# Patient Record
Sex: Female | Born: 1945 | Race: Black or African American | Hispanic: No | Marital: Married | Smoking: Never smoker
Health system: Southern US, Community
[De-identification: ages and names within clinical notes are randomized; demographics above are authoritative.]

## PROBLEM LIST (undated history)

## (undated) DIAGNOSIS — E785 Hyperlipidemia, unspecified: Secondary | ICD-10-CM

## (undated) DIAGNOSIS — I10 Essential (primary) hypertension: Secondary | ICD-10-CM

## (undated) DIAGNOSIS — H44009 Unspecified purulent endophthalmitis, unspecified eye: Secondary | ICD-10-CM

## (undated) DIAGNOSIS — M5410 Radiculopathy, site unspecified: Secondary | ICD-10-CM

## (undated) DIAGNOSIS — B009 Herpesviral infection, unspecified: Secondary | ICD-10-CM

## (undated) DIAGNOSIS — A609 Anogenital herpesviral infection, unspecified: Secondary | ICD-10-CM

## (undated) DIAGNOSIS — M797 Fibromyalgia: Secondary | ICD-10-CM

## (undated) DIAGNOSIS — R51 Headache: Secondary | ICD-10-CM

## (undated) DIAGNOSIS — K219 Gastro-esophageal reflux disease without esophagitis: Secondary | ICD-10-CM

## (undated) DIAGNOSIS — N3010 Interstitial cystitis (chronic) without hematuria: Secondary | ICD-10-CM

## (undated) DIAGNOSIS — D329 Benign neoplasm of meninges, unspecified: Secondary | ICD-10-CM

## (undated) DIAGNOSIS — B9620 Unspecified Escherichia coli [E. coli] as the cause of diseases classified elsewhere: Secondary | ICD-10-CM

## (undated) DIAGNOSIS — R739 Hyperglycemia, unspecified: Secondary | ICD-10-CM

## (undated) DIAGNOSIS — E119 Type 2 diabetes mellitus without complications: Secondary | ICD-10-CM

## (undated) DIAGNOSIS — K635 Polyp of colon: Secondary | ICD-10-CM

## (undated) DIAGNOSIS — N200 Calculus of kidney: Secondary | ICD-10-CM

## (undated) DIAGNOSIS — M1990 Unspecified osteoarthritis, unspecified site: Secondary | ICD-10-CM

## (undated) DIAGNOSIS — N390 Urinary tract infection, site not specified: Secondary | ICD-10-CM

## (undated) DIAGNOSIS — R946 Abnormal results of thyroid function studies: Secondary | ICD-10-CM

## (undated) DIAGNOSIS — I2510 Atherosclerotic heart disease of native coronary artery without angina pectoris: Secondary | ICD-10-CM

## (undated) HISTORY — DX: Benign neoplasm of meninges, unspecified: D32.9

## (undated) HISTORY — DX: Calculus of kidney: N20.0

## (undated) HISTORY — DX: Headache: R51

## (undated) HISTORY — PX: TONSILLECTOMY: SUR1361

## (undated) HISTORY — PX: TRIGGER FINGER RELEASE: SHX641

## (undated) HISTORY — PX: FLEXIBLE BRONCHOSCOPY W/ UPPER ENDOSCOPY: SHX1648

## (undated) HISTORY — DX: Abnormal results of thyroid function studies: R94.6

## (undated) HISTORY — PX: ROTATOR CUFF REPAIR: SHX139

## (undated) HISTORY — DX: Hyperlipidemia, unspecified: E78.5

## (undated) HISTORY — DX: Essential (primary) hypertension: I10

## (undated) HISTORY — DX: Gastro-esophageal reflux disease without esophagitis: K21.9

## (undated) HISTORY — DX: Hyperglycemia, unspecified: R73.9

## (undated) HISTORY — PX: KNEE ARTHROSCOPY: SUR90

## (undated) HISTORY — DX: Unspecified osteoarthritis, unspecified site: M19.90

## (undated) HISTORY — DX: Urinary tract infection, site not specified: N39.0

## (undated) HISTORY — DX: Atherosclerotic heart disease of native coronary artery without angina pectoris: I25.10

## (undated) HISTORY — PX: CARPAL TUNNEL RELEASE: SHX101

## (undated) HISTORY — DX: Unspecified purulent endophthalmitis, unspecified eye: H44.009

## (undated) HISTORY — DX: Unspecified Escherichia coli (E. coli) as the cause of diseases classified elsewhere: B96.20

## (undated) HISTORY — DX: Radiculopathy, site unspecified: M54.10

## (undated) HISTORY — DX: Fibromyalgia: M79.7

## (undated) HISTORY — DX: Type 2 diabetes mellitus without complications: E11.9

## (undated) HISTORY — DX: Polyp of colon: K63.5

## (undated) HISTORY — PX: BREAST BIOPSY: SHX20

## (undated) HISTORY — PX: ABDOMINAL HYSTERECTOMY: SHX81

## (undated) HISTORY — DX: Herpesviral infection, unspecified: B00.9

## (undated) HISTORY — DX: Interstitial cystitis (chronic) without hematuria: N30.10

---
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
## 1999-08-18 ENCOUNTER — Other Ambulatory Visit: Admission: RE | Admit: 1999-08-18 | Discharge: 1999-08-18 | Payer: Self-pay | Admitting: Obstetrics and Gynecology

## 2000-09-03 ENCOUNTER — Other Ambulatory Visit: Admission: RE | Admit: 2000-09-03 | Discharge: 2000-09-03 | Payer: Self-pay | Admitting: Obstetrics and Gynecology

## 2002-05-05 ENCOUNTER — Encounter: Payer: Self-pay | Admitting: Orthopedic Surgery

## 2002-05-05 ENCOUNTER — Encounter: Admission: RE | Admit: 2002-05-05 | Discharge: 2002-05-05 | Payer: Self-pay | Admitting: Orthopedic Surgery

## 2002-09-05 ENCOUNTER — Encounter: Admission: RE | Admit: 2002-09-05 | Discharge: 2002-09-05 | Payer: Self-pay | Admitting: Otolaryngology

## 2002-09-05 ENCOUNTER — Encounter: Payer: Self-pay | Admitting: Otolaryngology

## 2002-09-18 ENCOUNTER — Other Ambulatory Visit: Admission: RE | Admit: 2002-09-18 | Discharge: 2002-09-18 | Payer: Self-pay | Admitting: Obstetrics and Gynecology

## 2003-10-02 ENCOUNTER — Other Ambulatory Visit: Admission: RE | Admit: 2003-10-02 | Discharge: 2003-10-02 | Payer: Self-pay | Admitting: Gynecology

## 2004-01-18 ENCOUNTER — Encounter: Admission: RE | Admit: 2004-01-18 | Discharge: 2004-01-18 | Payer: Self-pay | Admitting: Internal Medicine

## 2004-07-30 HISTORY — PX: COLONOSCOPY W/ POLYPECTOMY: SHX1380

## 2004-08-24 ENCOUNTER — Ambulatory Visit (HOSPITAL_COMMUNITY): Admission: RE | Admit: 2004-08-24 | Discharge: 2004-08-24 | Payer: Self-pay | Admitting: Gastroenterology

## 2004-10-03 ENCOUNTER — Other Ambulatory Visit: Admission: RE | Admit: 2004-10-03 | Discharge: 2004-10-03 | Payer: Self-pay | Admitting: Gynecology

## 2004-10-26 ENCOUNTER — Ambulatory Visit: Payer: Self-pay | Admitting: Internal Medicine

## 2004-11-18 ENCOUNTER — Ambulatory Visit: Payer: Self-pay | Admitting: Internal Medicine

## 2004-11-24 ENCOUNTER — Encounter: Admission: RE | Admit: 2004-11-24 | Discharge: 2004-11-24 | Payer: Self-pay | Admitting: Internal Medicine

## 2005-04-17 ENCOUNTER — Ambulatory Visit: Payer: Self-pay | Admitting: Internal Medicine

## 2005-05-08 ENCOUNTER — Encounter: Admission: RE | Admit: 2005-05-08 | Discharge: 2005-08-06 | Payer: Self-pay | Admitting: Internal Medicine

## 2005-06-05 ENCOUNTER — Ambulatory Visit: Payer: Self-pay | Admitting: Internal Medicine

## 2005-06-09 ENCOUNTER — Ambulatory Visit: Payer: Self-pay | Admitting: Internal Medicine

## 2005-06-12 ENCOUNTER — Ambulatory Visit: Payer: Self-pay | Admitting: Cardiology

## 2005-06-23 ENCOUNTER — Ambulatory Visit: Payer: Self-pay | Admitting: Internal Medicine

## 2005-07-06 ENCOUNTER — Ambulatory Visit (HOSPITAL_COMMUNITY): Admission: RE | Admit: 2005-07-06 | Discharge: 2005-07-06 | Payer: Self-pay | Admitting: Internal Medicine

## 2005-07-13 ENCOUNTER — Ambulatory Visit: Payer: Self-pay | Admitting: Internal Medicine

## 2005-07-21 ENCOUNTER — Ambulatory Visit: Payer: Self-pay | Admitting: Internal Medicine

## 2005-08-18 ENCOUNTER — Ambulatory Visit: Payer: Self-pay | Admitting: Internal Medicine

## 2005-09-05 ENCOUNTER — Ambulatory Visit: Payer: Self-pay | Admitting: Internal Medicine

## 2005-09-18 ENCOUNTER — Ambulatory Visit: Payer: Self-pay | Admitting: Internal Medicine

## 2005-09-29 ENCOUNTER — Ambulatory Visit: Payer: Self-pay | Admitting: Internal Medicine

## 2005-10-05 ENCOUNTER — Other Ambulatory Visit: Admission: RE | Admit: 2005-10-05 | Discharge: 2005-10-05 | Payer: Self-pay | Admitting: Gynecology

## 2005-11-13 ENCOUNTER — Ambulatory Visit: Payer: Self-pay | Admitting: Internal Medicine

## 2005-12-18 ENCOUNTER — Ambulatory Visit: Payer: Self-pay | Admitting: Internal Medicine

## 2005-12-25 ENCOUNTER — Ambulatory Visit: Payer: Self-pay | Admitting: Internal Medicine

## 2005-12-28 ENCOUNTER — Ambulatory Visit: Payer: Self-pay | Admitting: Internal Medicine

## 2006-01-04 ENCOUNTER — Ambulatory Visit: Payer: Self-pay | Admitting: Internal Medicine

## 2006-01-23 ENCOUNTER — Ambulatory Visit: Payer: Self-pay | Admitting: Internal Medicine

## 2006-03-14 ENCOUNTER — Ambulatory Visit: Payer: Self-pay | Admitting: Internal Medicine

## 2006-04-02 ENCOUNTER — Ambulatory Visit: Payer: Self-pay | Admitting: Internal Medicine

## 2006-04-09 ENCOUNTER — Ambulatory Visit: Payer: Self-pay | Admitting: Internal Medicine

## 2006-05-04 ENCOUNTER — Ambulatory Visit: Payer: Self-pay | Admitting: Internal Medicine

## 2006-07-26 ENCOUNTER — Ambulatory Visit: Payer: Self-pay | Admitting: Internal Medicine

## 2006-08-01 ENCOUNTER — Ambulatory Visit: Payer: Self-pay | Admitting: Internal Medicine

## 2006-09-03 ENCOUNTER — Ambulatory Visit: Payer: Self-pay | Admitting: Internal Medicine

## 2006-09-07 ENCOUNTER — Encounter: Admission: RE | Admit: 2006-09-07 | Discharge: 2006-09-07 | Payer: Self-pay | Admitting: Internal Medicine

## 2006-10-08 ENCOUNTER — Other Ambulatory Visit: Admission: RE | Admit: 2006-10-08 | Discharge: 2006-10-08 | Payer: Self-pay | Admitting: Gynecology

## 2006-11-21 DIAGNOSIS — I10 Essential (primary) hypertension: Secondary | ICD-10-CM | POA: Insufficient documentation

## 2006-11-21 DIAGNOSIS — K219 Gastro-esophageal reflux disease without esophagitis: Secondary | ICD-10-CM

## 2006-12-24 ENCOUNTER — Ambulatory Visit: Payer: Self-pay | Admitting: Internal Medicine

## 2007-01-29 ENCOUNTER — Ambulatory Visit: Payer: Self-pay | Admitting: Internal Medicine

## 2007-01-31 ENCOUNTER — Encounter: Admission: RE | Admit: 2007-01-31 | Discharge: 2007-01-31 | Payer: Self-pay | Admitting: Internal Medicine

## 2007-04-02 ENCOUNTER — Ambulatory Visit: Payer: Self-pay | Admitting: Internal Medicine

## 2007-04-02 DIAGNOSIS — IMO0002 Reserved for concepts with insufficient information to code with codable children: Secondary | ICD-10-CM | POA: Insufficient documentation

## 2007-04-16 ENCOUNTER — Encounter: Payer: Self-pay | Admitting: Internal Medicine

## 2007-05-06 ENCOUNTER — Encounter: Payer: Self-pay | Admitting: Internal Medicine

## 2007-05-29 ENCOUNTER — Ambulatory Visit: Payer: Self-pay | Admitting: Internal Medicine

## 2007-05-29 DIAGNOSIS — N644 Mastodynia: Secondary | ICD-10-CM | POA: Insufficient documentation

## 2007-06-17 ENCOUNTER — Encounter: Payer: Self-pay | Admitting: Internal Medicine

## 2007-06-17 ENCOUNTER — Ambulatory Visit: Payer: Self-pay | Admitting: Internal Medicine

## 2007-07-09 ENCOUNTER — Ambulatory Visit: Payer: Self-pay | Admitting: Internal Medicine

## 2007-07-15 ENCOUNTER — Encounter (INDEPENDENT_AMBULATORY_CARE_PROVIDER_SITE_OTHER): Payer: Self-pay | Admitting: *Deleted

## 2007-07-23 ENCOUNTER — Ambulatory Visit: Payer: Self-pay | Admitting: Internal Medicine

## 2007-07-23 DIAGNOSIS — M2550 Pain in unspecified joint: Secondary | ICD-10-CM | POA: Insufficient documentation

## 2007-07-23 LAB — CONVERTED CEMR LAB
Cholesterol, target level: 200 mg/dL
HDL goal, serum: 40 mg/dL
LDL Goal: 100 mg/dL
Uric Acid, Serum: 4.4 mg/dL (ref 2.4–7.0)

## 2007-07-24 ENCOUNTER — Encounter (INDEPENDENT_AMBULATORY_CARE_PROVIDER_SITE_OTHER): Payer: Self-pay | Admitting: *Deleted

## 2007-10-10 ENCOUNTER — Other Ambulatory Visit: Admission: RE | Admit: 2007-10-10 | Discharge: 2007-10-10 | Payer: Self-pay | Admitting: Gynecology

## 2007-11-08 ENCOUNTER — Ambulatory Visit: Payer: Self-pay | Admitting: Internal Medicine

## 2007-11-21 ENCOUNTER — Encounter: Payer: Self-pay | Admitting: Internal Medicine

## 2007-12-06 ENCOUNTER — Encounter: Payer: Self-pay | Admitting: Internal Medicine

## 2007-12-26 ENCOUNTER — Ambulatory Visit: Payer: Self-pay | Admitting: Internal Medicine

## 2007-12-26 DIAGNOSIS — R51 Headache: Secondary | ICD-10-CM

## 2007-12-26 DIAGNOSIS — R519 Headache, unspecified: Secondary | ICD-10-CM | POA: Insufficient documentation

## 2007-12-26 DIAGNOSIS — D320 Benign neoplasm of cerebral meninges: Secondary | ICD-10-CM

## 2008-04-27 ENCOUNTER — Ambulatory Visit: Payer: Self-pay | Admitting: Internal Medicine

## 2008-04-29 ENCOUNTER — Encounter (INDEPENDENT_AMBULATORY_CARE_PROVIDER_SITE_OTHER): Payer: Self-pay | Admitting: *Deleted

## 2008-05-19 ENCOUNTER — Ambulatory Visit: Payer: Self-pay | Admitting: Internal Medicine

## 2008-05-22 ENCOUNTER — Ambulatory Visit: Payer: Self-pay | Admitting: Internal Medicine

## 2008-06-02 ENCOUNTER — Encounter: Payer: Self-pay | Admitting: Internal Medicine

## 2008-10-12 ENCOUNTER — Encounter: Payer: Self-pay | Admitting: Internal Medicine

## 2008-10-12 ENCOUNTER — Ambulatory Visit: Payer: Self-pay | Admitting: Gynecology

## 2008-10-12 ENCOUNTER — Encounter: Payer: Self-pay | Admitting: Gynecology

## 2008-10-12 ENCOUNTER — Other Ambulatory Visit: Admission: RE | Admit: 2008-10-12 | Discharge: 2008-10-12 | Payer: Self-pay | Admitting: Gynecology

## 2008-11-10 ENCOUNTER — Telehealth (INDEPENDENT_AMBULATORY_CARE_PROVIDER_SITE_OTHER): Payer: Self-pay | Admitting: *Deleted

## 2008-12-02 ENCOUNTER — Encounter: Payer: Self-pay | Admitting: Internal Medicine

## 2009-04-23 ENCOUNTER — Ambulatory Visit: Payer: Self-pay | Admitting: Internal Medicine

## 2009-04-23 DIAGNOSIS — R946 Abnormal results of thyroid function studies: Secondary | ICD-10-CM

## 2009-04-23 DIAGNOSIS — E785 Hyperlipidemia, unspecified: Secondary | ICD-10-CM

## 2009-04-23 DIAGNOSIS — R7309 Other abnormal glucose: Secondary | ICD-10-CM

## 2009-05-07 ENCOUNTER — Encounter (INDEPENDENT_AMBULATORY_CARE_PROVIDER_SITE_OTHER): Payer: Self-pay | Admitting: *Deleted

## 2009-05-17 ENCOUNTER — Ambulatory Visit: Payer: Self-pay | Admitting: Internal Medicine

## 2009-05-17 DIAGNOSIS — R209 Unspecified disturbances of skin sensation: Secondary | ICD-10-CM

## 2009-05-17 DIAGNOSIS — IMO0001 Reserved for inherently not codable concepts without codable children: Secondary | ICD-10-CM | POA: Insufficient documentation

## 2009-05-18 LAB — CONVERTED CEMR LAB: Vit D, 25-Hydroxy: 41 ng/mL (ref 30–89)

## 2009-05-19 ENCOUNTER — Encounter (INDEPENDENT_AMBULATORY_CARE_PROVIDER_SITE_OTHER): Payer: Self-pay | Admitting: *Deleted

## 2009-05-25 ENCOUNTER — Ambulatory Visit: Payer: Self-pay | Admitting: Internal Medicine

## 2009-05-25 DIAGNOSIS — K6289 Other specified diseases of anus and rectum: Secondary | ICD-10-CM

## 2009-05-25 DIAGNOSIS — K644 Residual hemorrhoidal skin tags: Secondary | ICD-10-CM | POA: Insufficient documentation

## 2009-06-01 ENCOUNTER — Encounter: Payer: Self-pay | Admitting: Internal Medicine

## 2009-06-03 ENCOUNTER — Encounter: Payer: Self-pay | Admitting: Internal Medicine

## 2009-06-14 ENCOUNTER — Ambulatory Visit: Payer: Self-pay | Admitting: Internal Medicine

## 2009-06-14 DIAGNOSIS — M5412 Radiculopathy, cervical region: Secondary | ICD-10-CM | POA: Insufficient documentation

## 2009-06-20 LAB — CONVERTED CEMR LAB: Sed Rate: 11 mm/hr (ref 0–22)

## 2009-06-21 ENCOUNTER — Encounter (INDEPENDENT_AMBULATORY_CARE_PROVIDER_SITE_OTHER): Payer: Self-pay | Admitting: *Deleted

## 2009-07-02 ENCOUNTER — Encounter: Payer: Self-pay | Admitting: Internal Medicine

## 2009-09-13 ENCOUNTER — Ambulatory Visit: Payer: Self-pay | Admitting: Gynecology

## 2009-09-27 ENCOUNTER — Encounter: Payer: Self-pay | Admitting: Internal Medicine

## 2009-09-27 ENCOUNTER — Ambulatory Visit: Payer: Self-pay | Admitting: Internal Medicine

## 2009-10-01 ENCOUNTER — Ambulatory Visit: Payer: Self-pay | Admitting: Internal Medicine

## 2009-10-01 DIAGNOSIS — R609 Edema, unspecified: Secondary | ICD-10-CM

## 2009-10-11 ENCOUNTER — Ambulatory Visit: Payer: Self-pay | Admitting: Gynecology

## 2009-10-11 ENCOUNTER — Other Ambulatory Visit: Admission: RE | Admit: 2009-10-11 | Discharge: 2009-10-11 | Payer: Self-pay | Admitting: Gynecology

## 2009-10-11 ENCOUNTER — Encounter (INDEPENDENT_AMBULATORY_CARE_PROVIDER_SITE_OTHER): Payer: Self-pay | Admitting: *Deleted

## 2009-10-20 ENCOUNTER — Encounter: Payer: Self-pay | Admitting: Internal Medicine

## 2009-10-27 ENCOUNTER — Ambulatory Visit: Payer: Self-pay | Admitting: Internal Medicine

## 2009-10-28 ENCOUNTER — Encounter (INDEPENDENT_AMBULATORY_CARE_PROVIDER_SITE_OTHER): Payer: Self-pay | Admitting: *Deleted

## 2009-11-30 HISTORY — PX: CYSTOSCOPY: SUR368

## 2009-12-02 ENCOUNTER — Encounter: Payer: Self-pay | Admitting: Internal Medicine

## 2010-02-08 ENCOUNTER — Telehealth: Payer: Self-pay | Admitting: Internal Medicine

## 2010-02-08 ENCOUNTER — Ambulatory Visit: Payer: Self-pay | Admitting: Internal Medicine

## 2010-02-08 DIAGNOSIS — Z87442 Personal history of urinary calculi: Secondary | ICD-10-CM

## 2010-02-08 DIAGNOSIS — R1013 Epigastric pain: Secondary | ICD-10-CM

## 2010-02-09 ENCOUNTER — Encounter: Payer: Self-pay | Admitting: Internal Medicine

## 2010-02-21 ENCOUNTER — Encounter (INDEPENDENT_AMBULATORY_CARE_PROVIDER_SITE_OTHER): Payer: Self-pay | Admitting: *Deleted

## 2010-02-22 ENCOUNTER — Ambulatory Visit: Payer: Self-pay | Admitting: Internal Medicine

## 2010-02-22 LAB — CONVERTED CEMR LAB: OCCULT 2: NEGATIVE

## 2010-02-23 ENCOUNTER — Encounter (INDEPENDENT_AMBULATORY_CARE_PROVIDER_SITE_OTHER): Payer: Self-pay | Admitting: *Deleted

## 2010-03-29 ENCOUNTER — Encounter: Payer: Self-pay | Admitting: Internal Medicine

## 2010-04-05 ENCOUNTER — Ambulatory Visit: Payer: Self-pay | Admitting: Internal Medicine

## 2010-04-05 DIAGNOSIS — R439 Unspecified disturbances of smell and taste: Secondary | ICD-10-CM

## 2010-04-22 ENCOUNTER — Telehealth (INDEPENDENT_AMBULATORY_CARE_PROVIDER_SITE_OTHER): Payer: Self-pay | Admitting: *Deleted

## 2010-04-25 ENCOUNTER — Encounter: Payer: Self-pay | Admitting: Internal Medicine

## 2010-07-18 ENCOUNTER — Encounter: Payer: Self-pay | Admitting: Internal Medicine

## 2010-08-09 ENCOUNTER — Encounter: Payer: Self-pay | Admitting: Internal Medicine

## 2010-08-23 ENCOUNTER — Encounter: Payer: Self-pay | Admitting: Internal Medicine

## 2010-08-29 ENCOUNTER — Ambulatory Visit: Payer: Self-pay | Admitting: Internal Medicine

## 2010-08-29 DIAGNOSIS — M25519 Pain in unspecified shoulder: Secondary | ICD-10-CM | POA: Insufficient documentation

## 2010-09-06 ENCOUNTER — Encounter: Payer: Self-pay | Admitting: Internal Medicine

## 2010-09-12 ENCOUNTER — Telehealth (INDEPENDENT_AMBULATORY_CARE_PROVIDER_SITE_OTHER): Payer: Self-pay | Admitting: *Deleted

## 2010-09-14 ENCOUNTER — Ambulatory Visit: Payer: Self-pay | Admitting: Internal Medicine

## 2010-09-14 DIAGNOSIS — M25549 Pain in joints of unspecified hand: Secondary | ICD-10-CM

## 2010-09-14 DIAGNOSIS — G5600 Carpal tunnel syndrome, unspecified upper limb: Secondary | ICD-10-CM

## 2010-09-19 ENCOUNTER — Encounter: Payer: Self-pay | Admitting: Internal Medicine

## 2010-09-19 ENCOUNTER — Ambulatory Visit: Payer: Self-pay | Admitting: Family Medicine

## 2010-09-19 LAB — CONVERTED CEMR LAB
Bilirubin Urine: NEGATIVE
Blood in Urine, dipstick: NEGATIVE
Glucose, Urine, Semiquant: NEGATIVE
Protein, U semiquant: NEGATIVE
WBC Urine, dipstick: NEGATIVE

## 2010-10-11 ENCOUNTER — Encounter: Payer: Self-pay | Admitting: Internal Medicine

## 2010-10-12 ENCOUNTER — Ambulatory Visit: Payer: Self-pay | Admitting: Gynecology

## 2010-10-12 ENCOUNTER — Other Ambulatory Visit
Admission: RE | Admit: 2010-10-12 | Discharge: 2010-10-12 | Payer: Self-pay | Source: Home / Self Care | Admitting: Gynecology

## 2010-10-28 ENCOUNTER — Encounter: Payer: Self-pay | Admitting: Internal Medicine

## 2010-11-16 ENCOUNTER — Encounter: Payer: Self-pay | Admitting: Internal Medicine

## 2010-11-16 ENCOUNTER — Ambulatory Visit
Admission: RE | Admit: 2010-11-16 | Discharge: 2010-11-16 | Payer: Self-pay | Source: Home / Self Care | Attending: Internal Medicine | Admitting: Internal Medicine

## 2010-11-16 DIAGNOSIS — M940 Chondrocostal junction syndrome [Tietze]: Secondary | ICD-10-CM | POA: Insufficient documentation

## 2010-11-16 DIAGNOSIS — M545 Low back pain: Secondary | ICD-10-CM | POA: Insufficient documentation

## 2010-11-27 LAB — CONVERTED CEMR LAB
ALT: 18 U/L
AST: 24 U/L
Albumin: 4 g/dL
Alkaline Phosphatase: 67 U/L
BUN: 12 mg/dL
Basophils Absolute: 0 K/uL
Basophils Relative: 0.4 %
Bilirubin, Direct: 0 mg/dL
CO2: 32 meq/L
Calcium: 9.6 mg/dL
Chloride: 105 meq/L
Cholesterol: 171 mg/dL
Cholesterol: 175 mg/dL
Creatinine, Ser: 0.7 mg/dL
Creatinine,U: 120.9 mg/dL
Eosinophils Absolute: 0.1 K/uL
Eosinophils Relative: 2.1 %
Free T4: 0.8 ng/dL
GFR calc non Af Amer: 108.35 mL/min
Glucose, Bld: 85 mg/dL
HCT: 39 %
HDL: 55.4 mg/dL
HDL: 62 mg/dL
Hemoglobin: 13.4 g/dL
Hgb A1c MFr Bld: 5.8 %
Hgb A1c MFr Bld: 6 % (ref 4.6–6.5)
Hgb A1c MFr Bld: 6.1 %
LDL Cholesterol: 104 mg/dL — ABNORMAL HIGH
LDL Cholesterol: 99 mg/dL
Lymphocytes Relative: 46.1 % — ABNORMAL HIGH
Lymphs Abs: 2.7 K/uL
MCHC: 34.4 g/dL
MCV: 88.9 fL
Microalb Creat Ratio: 3.3 mg/g
Microalb, Ur: 0.4 mg/dL
Monocytes Absolute: 0.4 K/uL
Monocytes Relative: 7.7 %
Neutro Abs: 2.4 K/uL
Neutrophils Relative %: 43.7 %
Platelets: 189 K/uL
Potassium: 3.9 meq/L
RBC: 4.39 M/uL
RDW: 12.4 %
Sodium: 143 meq/L
T3, Free: 2.7 pg/mL
TSH: 2.37 u[IU]/mL
Total Bilirubin: 0.9 mg/dL
Total CHOL/HDL Ratio: 2.8
Total CHOL/HDL Ratio: 3
Total Protein: 6.8 g/dL
Triglycerides: 44 mg/dL
Triglycerides: 83 mg/dL
VLDL: 16.6 mg/dL
VLDL: 9 mg/dL
WBC: 5.6 10*3/microliter

## 2010-11-29 NOTE — Progress Notes (Signed)
Summary: prior auth DENIED Voltaren  Phone Note Refill Request Message from:  Fax from Pharmacy on February 08, 2010 12:07 PM  Refills Requested: Medication #1:  VOLTAREN 1 % GEL apply two times a day to joints as needed prior [facility] fax [phone]   Method Requested: Fax to Local Pharmacy Initial call taken by: Barb Merino,  February 08, 2010 12:08 PM  Follow-up for Phone Call        prior auth in process for Voltaren .Kandice Hams  February 09, 2010 9:25 AM   Additional Follow-up for Phone Call Additional follow up Details #1::        PMH of intolerances to Codeine, Tramadol & darvon. 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 No documentation of a trial of oral diclofenac AND at least TWO other traditional NSAIDs...................Marland KitchenFelecia Deloach CMA  February 22, 2010 10:33 AM   Additional Follow-up for Phone Call Additional follow up Details #3:: Details for Additional Follow-up Action Taken: I request doctor to doctor communication . This patient has had abdominal pain for several weeks  from Aleve; it is MALPRACTICE to challenge her with more NSAIDS agents. I shall refer this to Medical Peer Review , [employer] & [employer] if desired  spoke with the PA agent there is not a docotor to doctor review available . pt will have to file a civil laws suit to get med approved. pt will need to try diclofenac along with at least two other NSAIDs for voltaren gel to be approved. pt has tried aleve and tylenol for the  pain. Pt states that pain is located in left hand and knee.  pls advise..........Marland KitchenFelecia Deloach CMA  February 23, 2010 9:15 AM   ------all document printed and place on ledge for dr ...........Marland KitchenFelecia Deloach CMA  February 24, 2010 9:35 AM  Additional Follow-up by: Marga Melnick MD,  February 22, 2010 12:11 PM

## 2010-11-29 NOTE — Letter (Signed)
Summary: [facility]  [facility]   Imported By: Lanelle Bal 11/01/2009 08:12:50  _____________________________________________________________________  External Attachment:    Type:   Image     Comment:   External Document

## 2010-11-29 NOTE — Letter (Signed)
Summary: [facility]  [facility]   Imported By: Lanelle Bal 12/16/2009 12:48:10  _____________________________________________________________________  External Attachment:    Type:   Image     Comment:   External Document

## 2010-11-29 NOTE — Letter (Signed)
Summary: [facility]  [facility]   Imported By: Lanelle Bal 11/10/2009 12:33:00  _____________________________________________________________________  External Attachment:    Type:   Image     Comment:   External Document

## 2010-11-29 NOTE — Letter (Signed)
Summary: Results Follow up Letter  [facility]  [address] [place], [place] [address]   Phone: [phone]  Fax: [phone]    02/23/2010 MRN: [id number]  Kristen Murray [address] [place], [place]  [address]  Dear Ms. Mahon,  The following are the results of your recent test(s):  Test         Result    Pap Smear:        Normal _____  Not Normal _____ Comments: ______________________________________________________ Cholesterol: LDL(Bad cholesterol):         Your goal is less than:         HDL (Good cholesterol):       Your goal is more than: Comments:  ______________________________________________________ Mammogram:        Normal _____  Not Normal _____ Comments:  ___________________________________________________________________ Hemoccult:        Normal _X__  Not normal _______ Comments:    _____________________________________________________________________ Other Tests:    We routinely do not discuss normal results over the telephone.  If you desire a copy of the results, or you have any questions about this information we can discuss them at your next office visit.   Sincerely,

## 2010-11-29 NOTE — Medication Information (Signed)
Summary: Denial for Voltaren Gel/[employer]  Denial for Voltaren Gel/[employer]   Imported By: Lanelle Bal 03/01/2010 11:15:59  _____________________________________________________________________  External Attachment:    Type:   Image     Comment:   External Document

## 2010-11-29 NOTE — Assessment & Plan Note (Signed)
Summary: DISCOMFORT IN ABD AND STOMACH--PH   Vital Signs:  Patient profile:   65 year old female Weight:      170.6 pounds BMI:     28.06 Temp:     98.2 degrees F oral Pulse rate:   87 / minute Resp:     17 per minute BP sitting:   128 / 70  (left arm) Cuff size:   large  Vitals Entered By: Shonna Chock (February 08, 2010 10:28 AM) CC: 1.) Stomach Pain-patient points to area near left breast  2.) Patient went to Urgent Care this weekend and was rx'ed ABX for UTI-patient ?'s if she passed a kidney stone  (patient noticed blood when urnating), Abdominal pain Comments REVIEWED MED LIST, PATIENT AGREED DOSE AND INSTRUCTION CORRECT    CC:  1.) Stomach Pain-patient points to area near left breast  2.) Patient went to Urgent Care this weekend and was rx'ed ABX for UTI-patient ?'s if she passed a kidney stone  (patient noticed blood when urnating) and Abdominal pain.  History of Present Illness:  Abdominal Pain      This is a 65 year old woman who presents with Abdominal pain since 1 month ago trigger.  The patient denies nausea, vomiting, diarrhea, constipation, melena, hematochezia, anorexia, and hematemesis.  The location of the pain is epigastric.  The pain is described as intermittent and dull w/o radiation.  The patient denies the following symptoms: fever, weight loss, dysuria, chest pain, jaundice, dark urine, and vaginal bleeding.  There are no triggers or relievers. She has not taken any meds for this. She was seen 0410/2011 for UTI ;Cipro Phenazopyrdine Rxed. ? passed a kidney stone this am based on blood in toilet .Dr Michel Harrow 11/2009 note reviewed; cystoscopy done for hematuria was negative.Taking Aleve 1-3 / day;1 coffee /day.No alcohol or peppermint.  Allergies: 1)  ! Codeine 2)  ! Darvon 3)  ! * Tramadol 4)  ! Cipro 5)  ! * Latex  Past History:  Past Medical History: Hypertension Hyperglycemia; A1c 6.3% in 2008 RADICULOPATHY GERD Meningioma, Dr Lucianne Muss,  Pollyann Savoy Headache Hyperlipidemia Abnormal TFTs (794.5) Nephrolithiasis, hx ofX 3, Dr Karilyn Cota  Past Surgical History: CTS  01/08 TRIGGER THUMB ARTHROSCOPIC KNEE SURGERY, L Hysterectomy & BSO  for  FIBROIDS, NO CANCER RIGHT ROTATOR CUFF SURGERY BREAST BIOPSY RIGHT Tonsillectomy COLONOSCOPY POLYPS 07/2004, redue date TBD Cystoscopy 11/2009 negative  Review of Systems GI:  Complains of indigestion; denies change in bowel habits and gas; No dysphagia. GU:  Denies discharge and hematuria.  Physical Exam  General:  in no acute distress; alert,appropriate and cooperative throughout examination Eyes:  No corneal or conjunctival inflammation noted. No icterus Mouth:  Oral mucosa and oropharynx without lesions or exudates.  No pharyngeal erythema.   Chest Wall:  No  tenderness noted. Lungs:  Normal respiratory effort, chest expands symmetrically. Lungs are clear to auscultation, no crackles or wheezes. Heart:  normal rate, regular rhythm, no gallop, no rub, no JVD, no HJR, and grade1  /6 systolic murmur.   Abdomen:  Bowel sounds positive,abdomen soft  without masses, organomegaly or hernias noted. Suprapubic tenderness  minimally  Skin:  Intact without suspicious lesions or rashes Cervical Nodes:  No lymphadenopathy noted Axillary Nodes:  No palpable lymphadenopathy Psych:  memory intact for recent and remote, normally interactive, and good eye contact.     Impression & Recommendations:  Problem # 1:  ABDOMINAL PAIN, EPIGASTRIC (ICD-789.06) Probable Aleve exacerbated ERD  Problem # 2:  GERD (ICD-530.81)  Her updated medication list for this problem includes:    Ranitidine Hcl 150 Mg Tabs (Ranitidine hcl) .Marland Kitchen... 1 two times a day pre meals  Complete Medication List: 1)  Verapamil Hcl Cr 120 Mg Cp24 (Verapamil hcl) .... Take 2 tabs qam 2)  Multi-vitamin  3)  Freestyle Lite Test Strp (Glucose blood) .... Test blood sugar once daily 4)  Lorazepam 0.5 Mg Tabs (Lorazepam) .Marland Kitchen.. 1 q 8 hrs  as needed 5)  Voltaren 1 % Gel (Diclofenac sodium) .... Apply two times a day to joints as needed 6)  Ranitidine Hcl 150 Mg Tabs (Ranitidine hcl) .Marland Kitchen.. 1 two times a day pre meals  Patient Instructions: 1)  Avoid foods high in acid (tomatoes, citrus juices, spicy foods). Avoid eating within two hours of lying down or before exercising. Do not over eat; try smaller more frequent meals. Elevate head of bed twelve inches when sleeping. Complete stool cards Prescriptions: RANITIDINE HCL 150 MG TABS (RANITIDINE HCL) 1 two times a day pre meals  #60 x 2   Entered and Authorized by:   Marga Melnick MD   Signed by:   Marga Melnick MD on 02/08/2010   Method used:   Faxed to ...       [facility] [address] (retail)       [address]       [place], [place]  [address]       Ph: [phone] or [phone]       Fax: [phone]   RxID:   [id number] VOLTAREN 1 % GEL (DICLOFENAC SODIUM) apply two times a day to joints as needed  #100 grams x 2   Entered and Authorized by:   Marga Melnick MD   Signed by:   Marga Melnick MD on 02/08/2010   Method used:   Faxed to ...       [facility] [address] (retail)       [address]       [place], [place]  [address]       Ph: [phone] or [phone]       Fax: [phone]   RxID:   [id number]

## 2010-11-29 NOTE — Assessment & Plan Note (Signed)
Summary: FOLLOWUP ON NERVE CONDUCTION TEST/KN   Vital Signs:  Patient profile:   65 year old female Height:      65.50 inches Weight:      168 pounds Temp:     98.1 degrees F oral Pulse rate:   82 / minute Resp:     17 per minute BP sitting:   140 / 70  (left arm)  Vitals Entered By: Jeremy Johann CMA (September 14, 2010 3:12 PM) CC: f/u test results   CC:  f/u test results.  History of Present Illness: Symptoms have not changed except fornon exertional , anxiety related aching in RUE . The NCT/EMG revealed no cervical radiculopathy ,but L median nerve impingment. PMH of R CTS surgery .  Current Medications (verified): 1)  Verapamil Hcl Cr 120 Mg Cp24 (Verapamil Hcl) .... Take 2 Tabs Qam 2)  Multi-Vitamin 3)  Freestyle Lite Test  Strp (Glucose Blood) .... Test Blood Sugar Once Daily 4)  Lorazepam 0.5 Mg Tabs (Lorazepam) .Marland Kitchen.. 1 Q 8 Hrs As Needed 5)  Ranitidine Hcl 150 Mg Tabs (Ranitidine Hcl) .Marland Kitchen.. 1 Two Times A Day Pre Meals  Allergies (verified): 1)  ! Codeine 2)  ! Darvon 3)  ! * Tramadol 4)  ! Cipro 5)  ! * Latex  Review of Systems MS:  Complains of joint pain, joint redness, and joint swelling; PIP joint changes of 3rd fingers.  Physical Exam  General:  well-nourished; alert,appropriate and cooperative throughout examination Extremities:  No clubbing, cyanosis, edema. Slight fusiform changes of PIP joints bilaterally with decreased extension Neurologic:  alert & oriented X3, strength normal in all extremities, and DTRs symmetrical and normal.   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 Take 2 tabs qam 2)  Multi-vitamin  3)  Freestyle Lite Test Strp (Glucose blood) .... Test blood sugar once daily 4)  Lorazepam 0.5 Mg Tabs (Lorazepam) .Marland Kitchen.. 1 q 8 hrs as needed 5)  Ranitidine Hcl 150 Mg Tabs (Ranitidine hcl) .Marland Kitchen.. 1 two times a day pre meals  Patient Instructions: 1)  Wear wrist splint with "triggering" activities. Consider Chiropractry or massage therapy for R shoulder pain.    Orders Added: 1)  Est. Patient Level III [[address]] 2)  Venipuncture [13086] 3)  TLB-Sedimentation Rate (ESR) [85652-ESR] 4)  T-Rheumatoid Factor [57846-96295] 5)  T-Finger(s) [73140TC]  Appended Document: FOLLOWUP ON NERVE CONDUCTION TEST/KN

## 2010-11-29 NOTE — Progress Notes (Signed)
Summary: lmom (11/14)  ---- Converted from flag ---- ---- 09/11/2010 2:52 PM, Marga Melnick MD wrote: she shopuld make appt to discuss her nerve conduction test ; no neck issues  on RIGHT but LEFT  carpal tunnel syndrome suggested ------------------------------  Phone Note Outgoing Call   Call placed by: Sarah Call placed to: Patient Summary of Call: need followup appt to discuss nerve conduction test Initial call taken by: Jerolyn Shin,  September 12, 2010 11:45 AM  Follow-up for Phone Call        Pt called back and scheduled an appt for tomm. 09/14/10 @ 11 a.m. Follow-up by: Lavell Islam,  September 13, 2010 8:21 AM

## 2010-11-29 NOTE — Letter (Signed)
Summary: [facility]  [facility]   Imported By: Lanelle Bal 04/07/2010 12:51:00  _____________________________________________________________________  External Attachment:    Type:   Image     Comment:   External Document

## 2010-11-29 NOTE — Letter (Signed)
Summary: Generic Letter  [facility]  [address] [place], [place] [address]   Phone: [phone]  Fax: [phone]    02/21/2010  Valinda Hoar [facility] [address], [place] [address]    Florie Carico [address] [place], [place] [address] Case [id number]   To Whom it May Concern:   This patient has a intolerance to codeine,tramadol,& darvon. The patient also has a diagnosis of GERD which has been exacerbated by the increased use of Aleve, and due to the increased use of Aleve the patient now has recurrent abdominal pain for the past 4 to 6 weeks.           Sincerely,       Winfield Cunas, MD

## 2010-11-29 NOTE — Procedures (Signed)
Summary: Upper GI Endoscopy/[facility]  Upper GI Endoscopy/[facility]   Imported By: Lanelle Bal 08/23/2010 10:30:21  _____________________________________________________________________  External Attachment:    Type:   Image     Comment:   External Document

## 2010-11-29 NOTE — Assessment & Plan Note (Signed)
Summary: pain after urinating/cbs   Vital Signs:  Patient profile:   65 year old female Weight:      167 pounds Pulse rate:   106 / minute BP sitting:   120 / 76  (left arm)  Vitals Entered By: Doristine Devoid CMA (September 19, 2010 11:32 AM) CC: pain and pressure when urinating started on sat.    History of Present Illness: 65 yo woman here today for ? UTI.  Saturday had to strain w/ BM and afterwards had pain w/ urination.  then developed urinary frequency.  sees Dr Logan Bores (U[facility] at [facility])  started Macrobid and Flavoxate (for bladder spasm).  has hx of kidney stones.  reports pain is suprapubic.  no blood in urine.  no fevers or chills.  denies back pain.  does report some tightness of hamstrings bilaterally.  Current Medications (verified): 1)  Verapamil Hcl Cr 120 Mg Cp24 (Verapamil Hcl) .... Take 2 Tabs Qam 2)  Multi-Vitamin 3)  Freestyle Lite Test  Strp (Glucose Blood) .... Test Blood Sugar Once Daily 4)  Lorazepam 0.5 Mg Tabs (Lorazepam) .Marland Kitchen.. 1 Q 8 Hrs As Needed 5)  Ranitidine Hcl 150 Mg Tabs (Ranitidine Hcl) .Marland Kitchen.. 1 Two Times A Day Pre Meals  Allergies (verified): 1)  ! Codeine 2)  ! Darvon 3)  ! * Tramadol 4)  ! Cipro 5)  ! * Latex  Past History:  Past medical, surgical, family and social histories (including risk factors) reviewed, and no changes noted (except as noted below).  Past Medical History: Reviewed history from 02/08/2010 and no changes required. Hypertension Hyperglycemia; A1c 6.3% in 2008 RADICULOPATHY GERD Meningioma, Dr Lucianne Muss, Pollyann Savoy Headache Hyperlipidemia Abnormal TFTs (794.5) Nephrolithiasis, hx ofX 3, Dr Karilyn Cota  Past Surgical History: Reviewed history from 02/08/2010 and no changes required. CTS  01/08 TRIGGER THUMB ARTHROSCOPIC KNEE SURGERY, L Hysterectomy & BSO  for  FIBROIDS, NO CANCER RIGHT ROTATOR CUFF SURGERY BREAST BIOPSY RIGHT Tonsillectomy COLONOSCOPY POLYPS 07/2004, redue date TBD Cystoscopy 11/2009 negative  Family  History: Reviewed history from 04/23/2009 and no changes required. [employer] DM  MATERNAL AUNT  DM MATERNAL UNCLE DM MATERNAL COUSIN  DM MATERNAL UNCLE TB Father:health history unknown  Mother: ? excess alcohol Siblings: health history unknown  Social History: Reviewed history from 04/23/2009 and no changes required. Never Smoked Occupation:Sales Advisor Alcohol use-no Regular exercise-no  Review of Systems      See HPI  Physical Exam  General:  well-nourished; alert,appropriate and cooperative throughout examination Abdomen:  soft, mild suprapubic tenderness, no rebound/guarding.  no CVA tenderness.   Impression & Recommendations:  Problem # 1:  DYSURIA (ICD-788.1) Assessment New UA w/out evidence of infxn or stone.  send urine for cx for completeness.  encouraged pt to f/u w/ urologist if sxs don't improve.  may have irritated urethra w/ straining. Orders: UA Dipstick w/o Micro (manual) ([address]) T-Culture, Urine ([address]11) Specimen Handling ([address])  Complete Medication List: 1)  Verapamil Hcl Cr 120 Mg Cp24 (Verapamil hcl) .... Take 2 tabs qam 2)  Multi-vitamin  3)  Freestyle Lite Test Strp (Glucose blood) .... Test blood sugar once daily 4)  Lorazepam 0.5 Mg Tabs (Lorazepam) .Marland Kitchen.. 1 q 8 hrs as needed 5)  Ranitidine Hcl 150 Mg Tabs (Ranitidine hcl) .Marland Kitchen.. 1 two times a day pre meals  Patient Instructions: 1)  Please follow up with your urologist as scheduled 2)  This may be irritation from your BM on Saturday and will improve with time 3)  Your urine doesn't look suspicious  for infection or stone but we will send it for culture 4)  Drink plenty of fluids 5)  Call with any questions or concerns 6)  Hang in there!   Orders Added: 1)  UA Dipstick w/o Micro (manual) [81002] 2)  T-Culture, Urine [[id number]] 3)  Specimen Handling [99000] 4)  Est. Patient Level III [09811]    Laboratory Results   Urine Tests   Date/Time Reported: September 19, 2010 12:14  PM  Routine Urinalysis   Color: yellow Appearance: Clear Glucose: negative   (Normal Range: Negative) Bilirubin: negative   (Normal Range: Negative) Ketone: negative   (Normal Range: Negative) Spec. Gravity: <1.005   (Normal Range: 1.003-1.035) Blood: negative   (Normal Range: Negative) pH: 5.0   (Normal Range: 5.0-8.0) Protein: negative   (Normal Range: Negative) Urobilinogen: 0.2   (Normal Range: 0-1) Nitrite: negative   (Normal Range: Negative) Leukocyte Esterace: negative   (Normal Range: Negative)

## 2010-11-29 NOTE — Letter (Signed)
Summary: [facility]  [facility]   Imported By: Lanelle Bal 09/13/2010 11:58:57  _____________________________________________________________________  External Attachment:    Type:   Image     Comment:   External Document

## 2010-11-29 NOTE — Op Note (Signed)
Summary: Cystoscopy/[facility]  Cystoscopy/[facility]   Imported By: Lanelle Bal 12/13/2009 13:06:28  _____________________________________________________________________  External Attachment:    Type:   Image     Comment:   External Document

## 2010-11-29 NOTE — Progress Notes (Signed)
Summary: Triage: Med Reaction  Phone Note Call from Patient Call back at Home Phone [phone]   Caller: Patient Summary of Call: Pt called and stated that she is going on a trip and is taking Typhoid. She has taken pills 1 and 2 and she is experiencing a HA and Nausea. Pt would like a return phone call. Army Fossa CMA  April 22, 2010 10:19 AM   Follow-up for Phone Call        She needs to call the prescriber @ [facility] or [facility] where ordered Follow-up by: Marga Melnick MD,  April 22, 2010 12:08 PM  Additional Follow-up for Phone Call Additional follow up Details #1::        Pt notified of MD instructions. Additional Follow-up by: Kathlene November,  April 22, 2010 1:11 PM

## 2010-11-29 NOTE — Procedures (Signed)
Summary: Colonoscopy/[facility]  Colonoscopy/[facility]   Imported By: Lanelle Bal 08/23/2010 10:29:34  _____________________________________________________________________  External Attachment:    Type:   Image     Comment:   External Document

## 2010-11-29 NOTE — Medication Information (Signed)
Summary: Appeal Denial for Voltaren Gel/[employer]  Appeal Denial for Voltaren Gel/[employer]   Imported By: Lanelle Bal 03/01/2010 11:17:00  _____________________________________________________________________  External Attachment:    Type:   Image     Comment:   External Document
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
## 2010-11-29 NOTE — Letter (Signed)
[facility] @ [facility] [place]   Imported By: Lanelle Bal 07/28/2010 09:59:57  _____________________________________________________________________  External Attachment:    Type:   Image     Comment:   External Document

## 2010-12-01 ENCOUNTER — Emergency Department (HOSPITAL_BASED_OUTPATIENT_CLINIC_OR_DEPARTMENT_OTHER)
Admission: EM | Admit: 2010-12-01 | Discharge: 2010-12-01 | Disposition: A | Discharge status: Home / Self Care | Payer: BC Managed Care – PPO | Attending: Emergency Medicine | Admitting: Emergency Medicine

## 2010-12-01 ENCOUNTER — Emergency Department (INDEPENDENT_AMBULATORY_CARE_PROVIDER_SITE_OTHER): Payer: BC Managed Care – PPO

## 2010-12-01 DIAGNOSIS — M25569 Pain in unspecified knee: Secondary | ICD-10-CM

## 2010-12-01 DIAGNOSIS — M25579 Pain in unspecified ankle and joints of unspecified foot: Secondary | ICD-10-CM

## 2010-12-01 DIAGNOSIS — W108XXA Fall (on) (from) other stairs and steps, initial encounter: Secondary | ICD-10-CM | POA: Insufficient documentation

## 2010-12-01 DIAGNOSIS — S93409A Sprain of unspecified ligament of unspecified ankle, initial encounter: Secondary | ICD-10-CM | POA: Insufficient documentation

## 2010-12-01 DIAGNOSIS — M79609 Pain in unspecified limb: Secondary | ICD-10-CM

## 2010-12-01 DIAGNOSIS — Y92009 Unspecified place in unspecified non-institutional (private) residence as the place of occurrence of the external cause: Secondary | ICD-10-CM | POA: Insufficient documentation

## 2010-12-01 NOTE — Letter (Signed)
Summary: [facility]  [facility]   Imported By: Lanelle Bal 11/04/2010 10:48:58  _____________________________________________________________________  External Attachment:    Type:   Image     Comment:   External Document

## 2010-12-01 NOTE — Letter (Signed)
Summary: [facility]  [facility]   Imported By: Lanelle Bal 10/27/2010 09:49:34  _____________________________________________________________________  External Attachment:    Type:   Image     Comment:   External Document

## 2010-12-01 NOTE — Consult Note (Signed)
Summary: [facility]  [facility]   Imported By: Lanelle Bal 11/08/2010 14:24:34  _____________________________________________________________________  External Attachment:    Type:   Image     Comment:   External Document

## 2010-12-01 NOTE — Assessment & Plan Note (Addendum)
Summary: DISCUSS SEVERAL THINGS/KN   Vital Signs:  Patient profile:   65 year old female Weight:      168.4 pounds BMI:     27.70 Temp:     98.1 degrees F oral Pulse rate:   88 / minute Resp:     17 per minute BP sitting:   130 / 76  (left arm) Cuff size:   large  Vitals Entered By: Shonna Chock CMA (November 16, 2010 11:17 AM) CC: 1.) Main concerns: lower back pain    2.) Chest soreness x 2 months, not sure if related to cough or non-related  3.) Discuss last visit with Dr.Kumar-? mild case of Depression , Back pain   CC:  1.) Main concerns: lower back pain    2.) Chest soreness x 2 months, not sure if related to cough or non-related  3.) Discuss last visit with Dr.Kumar-? mild case of Depression , and Back pain.  History of Present Illness:      This is a 65 year old female who presents with Chest pain since early December in context of URI which Dr Haroldine Laws has been treating.  The patient denies exertional chest pain, nausea, vomiting, diaphoresis, shortness of breath, palpitations, dizziness, light headedness, and syncope.  The pain is described as constant.  The pain is located in the sternal area and the pain does not radiate.  The pain is brought on or made worse by coughing. It was exacerbated by  a hug by her husband  .      The patient also presents with Back pain since 01/07 .  The patient denies fever, chills, weakness, loss of sensation, fecal incontinence, urinary incontinence, urinary retention, and dysuria.  The pain is located in the left low back.  The pain began after lifting a table  and after overuse, "Spring Cleaning".  The pain was not  made better by Chiropractry & heat.  Current Medications (verified): 1)  Verapamil Hcl Cr 120 Mg Cp24 (Verapamil Hcl) .... Take 2 Tabs Qam 2)  Multi-Vitamin 3)  Freestyle Lite Test  Strp (Glucose Blood) .... Test Blood Sugar Once Daily 4)  Lorazepam 0.5 Mg Tabs (Lorazepam) .Marland Kitchen.. 1 Q 8 Hrs As Needed 5)  Ranitidine Hcl 150 Mg Tabs  (Ranitidine Hcl) .Marland Kitchen.. 1 Two Times A Day Pre Meals 6)  Macrodantin 100 Mg Caps (Nitrofurantoin Macrocrystal) .Marland Kitchen.. 1 By Mouth X 2-3  Months For Frequent Uti Symptoms  Allergies: 1)  ! Codeine 2)  ! Darvon 3)  ! * Tramadol 4)  ! Cipro 5)  ! * Latex  Review of Systems Psych:  Denies anxiety, easily angered, easily tearful, and irritability; Dr Lucianne Muss feels mild depression present, but she does not feel so. "I am blue sometimes".  Physical Exam  General:  well-nourished,in no acute distress; alert,appropriate and cooperative throughout examination Chest Wall:   Sterna costochondrial tenderness.   Lungs:  Normal respiratory effort, chest expands symmetrically. Lungs are clear to auscultation, no crackles or wheezes. Heart:  Normal rate and regular rhythm. S1 and S2 normal without gallop, murmur, click, rub or other extra sounds. Msk:  No deformity or scoliosis noted of thoracic or lumbar spine.   Tender to percussion L low back. Classic low back pain Neurologic:  alert & oriented X3, strength normal in all extremities, hee/ toe gait normal, and DTRs symmetrical and normal.   Skin:  Intact without suspicious lesions or rashes   Impression & Recommendations:  Problem # 1:  COSTOCHONDRITIS (ICD-733.6)  Problem # 2:  LOW BACK PAIN, ACUTE (ICD-724.2)  Classic low back syndrome  Her updated medication list for this problem includes:    Cyclobenzaprine Hcl 5 Mg Tabs (Cyclobenzaprine hcl) .Marland Kitchen... 1 two times a day & 1-2 at bedtime as needed  Complete Medication List: 1)  Verapamil Hcl Cr 120 Mg Cp24 (Verapamil hcl) .... Take 2 tabs qam 2)  Multi-vitamin  3)  Freestyle Lite Test Strp (Glucose blood) .... Test blood sugar once daily 4)  Lorazepam 0.5 Mg Tabs (Lorazepam) .Marland Kitchen.. 1 q 8 hrs as needed 5)  Ranitidine Hcl 150 Mg Tabs (Ranitidine hcl) .Marland Kitchen.. 1 two times a day pre meals 6)  Macrodantin 100 Mg Caps (Nitrofurantoin macrocrystal) .Marland Kitchen.. 1 by mouth x 2-3  months for frequent uti symptoms 7)   Cyclobenzaprine Hcl 5 Mg Tabs (Cyclobenzaprine hcl) .Marland Kitchen.. 1 two times a day & 1-2 at bedtime as needed  Patient Instructions: 1)  Ask Dr Christell Faith if a TENS unit OR Ultrasound might be  appropriate for the back pain. Use heating pad OR Zostrix Cream two times a day as needed for Costochondritis. Glucosamine sulfate 1500 mg once daily  4-6 weeks until chest pain has resolved for @ least 1 week. See me if you feel you are having persistent  significant depression. Prescriptions: CYCLOBENZAPRINE HCL 5 MG TABS (CYCLOBENZAPRINE HCL) 1 two times a day & 1-2 at bedtime as needed  #30 x 0   Entered and Authorized by:   Marga Melnick MD   Signed by:   Marga Melnick MD on 11/16/2010   Method used:   Electronically to        [facility] (retail)       [address]       [place], [place]  [address]       Ph: [phone] or [phone]       Fax: [phone]   RxID:   [id number]    Orders Added: 1)  Est. Patient Level IV [99214]  Appended Document: DISCUSS SEVERAL THINGS/KN Sayuri, I reviewed Dr Remus Blake  most recent evaluation ; there is no mention of  depressive symptoms in her note.If you do feel that depression is significant ; certainly medication should be considered. I mentioned two possible therapies for Dr Christell Faith to consider, but I defer to his expertise. I'm drinking my coffee this am from the beautiful cup you brought back from the [place]. Thank you very much. Hopp  Appended Document: DISCUSS SEVERAL THINGS/KN mailed.

## 2010-12-06 ENCOUNTER — Encounter: Payer: Self-pay | Admitting: Internal Medicine

## 2010-12-21 NOTE — Letter (Signed)
Summary: [facility]  [facility]   Imported By: Maryln Gottron 12/14/2010 12:45:51  _____________________________________________________________________  External Attachment:    Type:   Image     Comment:   External Document

## 2010-12-27 NOTE — Letter (Signed)
Summary: [facility]  [facility]   Imported By: Maryln Gottron 12/22/2010 08:47:45  _____________________________________________________________________  External Attachment:    Type:   Image     Comment:   External Document

## 2011-03-17 NOTE — Op Note (Signed)
NAME:  Kristen Murray, Kristen Murray                 ACCOUNT NO.:  [id number]   MEDICAL RECORD NO.:  [id number]          PATIENT TYPE:  AMB   LOCATION:  ENDO                         FACILITY:  [facility]   PHYSICIAN:  Danise Edge, M.D.   DATE OF BIRTH:  [birth year]   DATE OF PROCEDURE:  08/24/2004  DATE OF DISCHARGE:                                 OPERATIVE REPORT   PROCEDURE:  Colonoscopy.   PROCEDURE INDICATION:  Kristen Murray is a 65 year old female, born May 04, 1946.  Kristen Murray has undergone a colonoscopic exam in the past with colon  polyps removed.   ENDOSCOPIST:  Danise Edge, M.D.   PREMEDICATION:  1.  Versed 6.5 mg.  2.  Demerol 50 mg.   DESCRIPTION OF PROCEDURE:  After obtaining informed consent, Kristen Murray was  placed in the left lateral decubitus position.  I administered intravenous  Demerol and intravenous Versed to achieve conscious sedation for the  procedure.  The patient's blood pressure, oxygen saturation, and cardiac  rhythm were monitored throughout the procedure and documented in the medical  record.   Anal inspection and digital rectal exam were normal.  The Olympus adjustable  pediatric colonoscope was introduced into the rectum and advanced to the  cecum.  Colonic preparation for the exam today was excellent.   RECTUM:  Normal.  SIGMOID COLON AND DESCENDING COLON:  Normal.  SPLENIC FLEXURE:  Normal.  TRANSVERSE COLON:  Normal.  HEPATIC FLEXURE:  Normal.  ASCENDING COLON:  Normal.  CECUM AND ILEOCECAL VALVE:  Normal.   ASSESSMENT:  Normal proctocolonoscopy to the cecum.   RECOMMENDATIONS:  Repeat colonoscopy in approximately 5 years.     MJ/MEDQ  D:  08/24/2004  T:  08/24/2004  Job:  [id number]   cc:   Kristen Murray. Alwyn Ren, M.D. [facility]
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
## 2011-03-17 NOTE — Assessment & Plan Note (Signed)
[facility]                        [facility] OFFICE NOTE   NAME:Murray, Kristen M                        MRN:          [id number]  DATE:01/29/2007                            DOB:          1945-12-28    [name] is seen on January 29, 2007, for pain in the right thigh present  for 6 to 7 weeks. It is described as an ache, constantly. Positioning  with a pillow helps, but then she has aggravation of low back symptoms.  She relates this to an injury in a fall in 2003 or 2005, having slipped  on a wet deck and fallen on the right thigh.   It now  does interfere with sleep. She denies any constitutional  symptoms of fever, chills, sweats, or weight loss. She has had no  incontinence of urine or stool. She has had no paresthesias.   Weight is essentially stable at 175. Vital signs are normal. She has  venous spiders over the thigh. There are no significant changes in the  skin or muscle mass. She is tender over the medial right lateral thigh.  Range of motion and strength is normal. Straight leg raising and deep  tendon reflexes are also normal. Heel and toe walking reveal no deficit.   She will be referred to physical therapy for definitive treatment.     Titus Dubin. Alwyn Ren, MD,FACP,FCCP  Electronically Signed    [facility]/Med[facility]  DD: 01/29/2007  DT: 01/29/2007  Job #: [id number]

## 2011-04-14 ENCOUNTER — Encounter: Payer: Self-pay | Admitting: Internal Medicine

## 2011-04-14 ENCOUNTER — Ambulatory Visit (INDEPENDENT_AMBULATORY_CARE_PROVIDER_SITE_OTHER): Payer: BC Managed Care – PPO | Admitting: Internal Medicine

## 2011-04-14 DIAGNOSIS — N390 Urinary tract infection, site not specified: Secondary | ICD-10-CM

## 2011-04-14 DIAGNOSIS — R319 Hematuria, unspecified: Secondary | ICD-10-CM

## 2011-04-14 LAB — POCT URINALYSIS DIPSTICK
Glucose, UA: NEGATIVE
Spec Grav, UA: 1
Urobilinogen, UA: 0.2
pH, UA: 5

## 2011-04-14 MED ORDER — PHENAZOPYRIDINE HCL 100 MG PO TABS: 100.0000 mg | ORAL_TABLET | Freq: Three times a day (TID) | ORAL | Status: AC | PRN

## 2011-04-14 MED ORDER — NITROFURANTOIN MONOHYD MACRO 100 MG PO CAPS: 100.0000 mg | ORAL_CAPSULE | Freq: Two times a day (BID) | ORAL | Status: AC

## 2011-04-14 NOTE — Patient Instructions (Signed)
Force NON dairy fluids for next 48 hrs . (Note: [phone])

## 2011-04-14 NOTE — Progress Notes (Signed)
  Subjective:    Patient ID: Kristen Murray, female    DOB: 05/23/1946, 65 y.o.   MRN: [id number]  HPI DYSURIA  Onset: last night about 9 pm    Worsening: progressing Symptoms Urgency: yes  Frequency: yes  Hesitancy: no  Hematuria: no  Flank Pain: no  Fever: no    Nausea: yes, no vomiting  Discharge: no Rash: no  Red Flags  : (Risk Factors for Complicated UTI) Recent Antibiotic Usage (last 30 days): yes, for sinusitis < 2 weeks ago  More than 3 UTI's last 12 months: no  PMH of  1. DM: no 2. Renal Disease/Calculi: yes 3. Urinary Tract Abnormality: no  4. Instrumentation/Trauma: no; Cystoscopy 07/11, Dr Logan Bores, [facility]    Review of Systems Dr Sherlean Foot treating pain R ankle after spraining ankles in 02/12    Objective:   Physical Exam General appearance is one of good health and nourishment w/o distress .Eyes: No conjunctival inflammation or scleral icterus is present.  Heart:  Normal rate and regular rhythm. S1 and S2 normal without gallop, murmur, click, rub or other extra sounds  S4 with slurring   Lungs:Chest clear to auscultation; no wheezes, rhonchi,rales ,or rubs present.No increased work of breathing.   Abdomen: bowel sounds normal, soft and non-tender without masses, organomegaly or hernias noted.  No guarding or rebound   Skin:Warm & dry.  Intact without suspicious lesions or rashes ; no jaundice or tenting  Lymphatic: No lymphadenopathy is noted about the head, neck,or  Axilla. No flank pain  To percussion; negative SLR.              Assessment & Plan:  #1 UTI Plan: see Orders

## 2011-04-17 LAB — URINE CULTURE: Colony Count: >=100000

## 2011-06-28 ENCOUNTER — Encounter: Payer: Self-pay | Admitting: Internal Medicine

## 2011-06-28 ENCOUNTER — Ambulatory Visit (INDEPENDENT_AMBULATORY_CARE_PROVIDER_SITE_OTHER): Payer: BC Managed Care – PPO | Admitting: Internal Medicine

## 2011-06-28 VITALS — BP 114/70 | HR 88 | Temp 98.4°F | Wt 174.2 lb

## 2011-06-28 DIAGNOSIS — M545 Low back pain: Secondary | ICD-10-CM

## 2011-06-28 MED ORDER — MELOXICAM 7.5 MG PO TABS: 7.5000 mg | ORAL_TABLET | ORAL | Status: DC

## 2011-06-28 NOTE — Progress Notes (Signed)
Subjective:    Patient ID: Kristen Murray, female    DOB: 1946/02/03, 65 y.o.   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 Victorino Dike, an Orthopedic foot  specialist is treating her for residual pain sustained in ankle sprains in February of this year.  Dr. Reita Cliche, Hand specialist is treating her for tendinitis of the right upper extremity. She is receiving physical therapy for both conditions.        Review of Systems     Objective:   Physical Exam Gen.: Healthy and well-nourished in appearance. Alert, appropriate and cooperative throughout exam. Neck: No deformities, masses, or tenderness noted. Range of motion  normal Lungs: Normal respiratory effort; chest expands symmetrically. Lungs are clear to auscultation without rales, wheezes, or increased work of breathing. Heart: Normal rate and rhythm. Normal S1 and S2. No gallop, click, or rub. S4 with slurring; no  murmur. Abdomen: Bowel sounds normal; abdomen soft and nontender. No masses, organomegaly or hernias noted. No AAA or bruits                                                                               Musculoskeletal/extremities: No deformity or scoliosis noted of  the thoracic or lumbar spine.Tender L LS area to touch. No clubbing, cyanosis, edema. Mild isolated flexion finger deformities noted. Range of motion  normal .Neg SLR.Tone & strength  normal. Nail health   good. She lies back and sits up on the exam table using the "classic low back crawl". She's wearing an external support over the right elbow. Vascular: Carotid, radial artery, dorsalis pedis and  posterior tibial pulses are full and equal. No bruits present. Neurologic: Alert and oriented x3. Deep tendon reflexes symmetrical and normal. Gait is slow and deliberate; she is able to walk on her heels and his.         Skin: Intact without suspicious lesions or rashes. Specifically LS clear. Lymph: No cervical, axillary  lymphadenopathy present. Psych: Mood and affect are normal. Normally interactive                                                                                         Assessment & Plan:  #  1 acute low back strain in the context of overuse as noted above. This is undoubtedly due to be compromised due to the acute ankle issues for which she is receiving physical therapy. No disc issue suggested.  She should benefit from physical therapy for the low back; this could be completed to Dr. Laverta [place] office.  Her allergies/intolerances limit options but generic Flexeril is one. A Cox 2 agent is also an option.

## 2011-06-28 NOTE — Patient Instructions (Signed)
Use the generic Flexeril 1-2 at bedtime as needed. Use the meloxicam 7.5 mg twice a day as needed for back  pain. Share these records with Dr. Laverta Baltimore Physical Therapist. They will need to consider a program to treat both the ankles and the back to prevent further injury.

## 2011-07-07 ENCOUNTER — Encounter: Payer: Self-pay | Admitting: Family Medicine

## 2011-07-07 ENCOUNTER — Ambulatory Visit (INDEPENDENT_AMBULATORY_CARE_PROVIDER_SITE_OTHER): Payer: BC Managed Care – PPO | Admitting: Family Medicine

## 2011-07-07 VITALS — BP 140/74 | HR 77 | Temp 98.0°F | Wt 176.2 lb

## 2011-07-07 DIAGNOSIS — M546 Pain in thoracic spine: Secondary | ICD-10-CM

## 2011-07-07 MED ORDER — TRAMADOL HCL 50 MG PO TABS: 50.0000 mg | ORAL_TABLET | Freq: Four times a day (QID) | ORAL | Status: DC | PRN

## 2011-07-07 NOTE — Progress Notes (Signed)
  Subjective:    Patient ID: Kristen Murray, female    DOB: 16-May-1946, 65 y.o.   MRN: [id number]  HPI Pt here c/o upper mid back pain that started Tuesday.   No known injury.  Pt has mobic from back pain last week.   Review of Systems As above    Objective:   Physical Exam  Constitutional: She is oriented to person, place, and time. She appears well-developed and well-nourished.  Musculoskeletal:       + muscle spasms  R paraspinal muscles ---T4-7 + spasms in trap on right  Neurological: She is alert and oriented to person, place, and time.  Psychiatric: She has a normal mood and affect. Her behavior is normal.          Assessment & Plan:  Thoracic pain---  Heat, stretching, flexeril , mobic or ultram

## 2011-07-19 ENCOUNTER — Encounter: Payer: Self-pay | Admitting: Internal Medicine

## 2011-07-19 ENCOUNTER — Ambulatory Visit (INDEPENDENT_AMBULATORY_CARE_PROVIDER_SITE_OTHER): Payer: BC Managed Care – PPO | Admitting: Internal Medicine

## 2011-07-19 ENCOUNTER — Telehealth: Payer: Self-pay | Admitting: Internal Medicine

## 2011-07-19 DIAGNOSIS — M5412 Radiculopathy, cervical region: Secondary | ICD-10-CM

## 2011-07-19 DIAGNOSIS — R7309 Other abnormal glucose: Secondary | ICD-10-CM

## 2011-07-19 MED ORDER — GABAPENTIN 100 MG PO CAPS: 100.0000 mg | ORAL_CAPSULE | ORAL | Status: DC

## 2011-07-19 NOTE — Progress Notes (Signed)
  Subjective:    Patient ID: Kristen Murray, female    DOB: [birth year], 65 y.o.   MRN: [id number]  HPI Extremity pain Location:R shoulder / upper back @ scapula Onset:2 weeks ago upon awakening Trigger/injury:no Pain quality:throbbing as cost in vise Pain severity:up to 11-12 Duration:constant  Radiation: R ant chest , R inf  axillary area & to R hand Exacerbating factors:no Treatment/response:ice, heat, Flexeril , BenGay ,NSAIDS with temporary benefit Review of systems: Constitutional: no fever, chills,  change in weight . Some sweats around neck Musculoskeletal:no  muscle cramps or pain; no  joint stiffness, redness, or swelling Skin:no rash, color change Neuro: minor RUE  weakness; incontinence (stool/urine). Some  numbness w/o tingling in R hand Heme:no lymphadenopathy; abnormal bruising or bleeding   She was recently seen by the  Neurosurgeon at [facility] at the referral of Dr Lucianne Muss. He evaluated her  tremor but did not feel significant pathologic process was present. This issue was not present then       Review of Systems   her back pain has resolved with physical therapy; she continues to see the hand surgeon for the right elbow tenosynovitis.  She is due for followup A1 C. to check her fasting hyperglycemia. She believes her fasting blood sugars are averaging 158. She denies polyuria, polyphagia, polydipsia except that related to the generic Flexeril.     Objective:   Physical Exam she is healthy in appearance, in no acute distress  Neck is supple with essentially normal range of motion. There is minimal decrease with lateral rotation.  Thyroid normal to palpation.  The skin over the upper back reveals no significant lesions.  Chest is clear to auscultation with no increased work of breathing  She has a regular rhythm; she has a grade 1 systolic murmur.  She is no lymphadenopathy of  head, neck, or axilla.  Abdomen is nontender with no organomegaly masses or  aortic enlargement.  Deep tendon  reflexes are normal as is strength in all extremities.  She is minimal isolated flexion contractures of the knee fingers.  Nail  health is excellent .  Sensation is normal over the feet.  ROM of upper extremities       Assessment & Plan:  #1 cervical radiculopathy suggested, mainly C6 but possibly T2 ; a significant shoulder issue is not suggested clinically.  #2 fasting hyperglycemia  Plan: See orders and recommendations.

## 2011-07-19 NOTE — Patient Instructions (Signed)
If you do not get significant response with the gabapentin, I recommend followup with the Neurosurgeon at [facility].

## 2011-07-21 ENCOUNTER — Telehealth: Payer: Self-pay

## 2011-07-21 MED ORDER — HYDROCHLOROTHIAZIDE 12.5 MG PO CAPS: 12.5000 mg | ORAL_CAPSULE | Freq: Every day | ORAL | Status: DC

## 2011-07-21 NOTE — Telephone Encounter (Signed)
Patient left message on voicemail: patient would like to know if she can take Neurotin more frequently than every 8 hours.  Called and spoke with patient, Per Dr.Hopper have patient take 2 every 8 hours   Patient then said her top number of her B/P is still in the 150's: Per Dr.Hopper have patient take HCTZ 12.5mg  #30 with no refills , continue to monitor B/P and call if not controlled. Patient agreed

## 2011-08-21 ENCOUNTER — Encounter: Payer: Self-pay | Admitting: Internal Medicine

## 2011-08-21 ENCOUNTER — Telehealth: Payer: Self-pay | Admitting: *Deleted

## 2011-08-21 ENCOUNTER — Ambulatory Visit (INDEPENDENT_AMBULATORY_CARE_PROVIDER_SITE_OTHER): Payer: BC Managed Care – PPO | Admitting: Internal Medicine

## 2011-08-21 ENCOUNTER — Telehealth: Payer: Self-pay | Admitting: Internal Medicine

## 2011-08-21 ENCOUNTER — Encounter: Payer: Self-pay | Admitting: *Deleted

## 2011-08-21 DIAGNOSIS — H8110 Benign paroxysmal vertigo, unspecified ear: Secondary | ICD-10-CM

## 2011-08-21 DIAGNOSIS — D320 Benign neoplasm of cerebral meninges: Secondary | ICD-10-CM

## 2011-08-21 MED ORDER — GLUCOSE BLOOD VI STRP: ORAL_STRIP | Status: AC

## 2011-08-21 MED ORDER — DIAZEPAM 2 MG PO TABS: 2.0000 mg | ORAL_TABLET | Freq: Three times a day (TID) | ORAL | Status: DC | PRN

## 2011-08-21 MED ORDER — AMBULATORY NON FORMULARY MEDICATION: Status: DC

## 2011-08-21 MED ORDER — FREESTYLE LANCETS MISC: Status: DC

## 2011-08-21 NOTE — Progress Notes (Signed)
Addended by: Edgardo Roys on: 08/21/2011 01:55 PM   Modules accepted: Orders

## 2011-08-21 NOTE — Telephone Encounter (Signed)
error 

## 2011-08-21 NOTE — Progress Notes (Signed)
  Subjective:    Patient ID: Kristen Murray, female    DOB: [birth year], 65 y.o.   MRN: [id number]  HPI Dizziness Onset:08/18/2011 while in [place].Note :she flew there 10/12 & she flew back 10/20 Context:as soon as she lay back Position change: Benign positional vertigo symptoms:reclining & in either LDP Straining:no Pain:no Cardiac prodrome: no palpitations, irregular rhythm, heart rate change Neurologic prodrome:no  headache, numbness and tingling, weakness or significant change in coordination (gait/falling). No true vertigo  Syncope:no Seizure activity:no Upper respiratory tract infection/extrinsic symptoms:no Duration:up to 1 minute Frequency:every time she reclines; it can occur when standing from sittting Associated signs and symptoms: Visual change (blurred/double/loss):no Hearing loss/tinnitus:no Nausea/sweating:some nausea Chest pain:no Dyspnea:no Treatment/response:change position      Review of Systems     Objective:   Physical Exam Gen. appearance: Well-nourished, in no distress. Initially she was noted to grasp the exam table while sitting quietly Eyes: Extraocular motion intact, field of vision normal, vision grossly intact, some unsustained vertical  Nystagmus with head turned to L lying supine  ENT: Canals clear, tympanic membranes normal, tuning fork exam normal, hearing grossly normal Neck: Normal range of motion, no masses, normal thyroid Cardiovascular: Rate and rhythm normal; no murmurs, gallops or extra heart sounds Muscle skeletal: Range of motion, tone, &  strength normal Neuro:no cranial nerve deficit, deep tendon  reflexes normal, gait normal. Romberg was slightly unsteady; she tended to fall to the left. Gait was otherwise normal as was finger to nose testing Lymph: No cervical or axillary LA Skin: Warm and dry without suspicious lesions or rashes Psych: no anxiety or mood change. Normally interactive and cooperative.         Assessment & Plan:   #1 benign positional vertigo, variant. This may be related suboptimal fluid intake,  environmental pressure change in flight.  #2 meningioma, as per Dr. Lucianne Muss  Plan: See orders and recommendations

## 2011-08-21 NOTE — Patient Instructions (Signed)
Go to Web M.D. for information on benign positional vertigo (BPV) . Physical therapy exercises can treat that. Marland KitchenShare results with [facility] specialists

## 2011-08-21 NOTE — Telephone Encounter (Signed)
Same Day [employer]st[employer]. 

## 2011-08-28 DIAGNOSIS — M4802 Spinal stenosis, cervical region: Secondary | ICD-10-CM | POA: Insufficient documentation

## 2011-08-28 DIAGNOSIS — D329 Benign neoplasm of meninges, unspecified: Secondary | ICD-10-CM | POA: Insufficient documentation

## 2011-10-16 ENCOUNTER — Ambulatory Visit (INDEPENDENT_AMBULATORY_CARE_PROVIDER_SITE_OTHER): Payer: BC Managed Care – PPO | Admitting: Gynecology

## 2011-10-16 ENCOUNTER — Other Ambulatory Visit (HOSPITAL_COMMUNITY)
Admission: RE | Admit: 2011-10-16 | Discharge: 2011-10-16 | Disposition: A | Discharge status: Home / Self Care | Payer: BC Managed Care – PPO | Source: Ambulatory Visit | Attending: Gynecology | Admitting: Gynecology

## 2011-10-16 ENCOUNTER — Encounter: Payer: Self-pay | Admitting: Gynecology

## 2011-10-16 VITALS — BP 138/72 | Ht 65.0 in | Wt 173.5 lb

## 2011-10-16 DIAGNOSIS — N952 Postmenopausal atrophic vaginitis: Secondary | ICD-10-CM

## 2011-10-16 DIAGNOSIS — Z1211 Encounter for screening for malignant neoplasm of colon: Secondary | ICD-10-CM

## 2011-10-16 DIAGNOSIS — Z01419 Encounter for gynecological examination (general) (routine) without abnormal findings: Secondary | ICD-10-CM

## 2011-10-16 DIAGNOSIS — R635 Abnormal weight gain: Secondary | ICD-10-CM

## 2011-10-16 DIAGNOSIS — E785 Hyperlipidemia, unspecified: Secondary | ICD-10-CM

## 2011-10-16 DIAGNOSIS — R946 Abnormal results of thyroid function studies: Secondary | ICD-10-CM

## 2011-10-16 LAB — POC HEMOCCULT BLD/STL (OFFICE/1-CARD/DIAGNOSTIC): Fecal Occult Blood, POC: NEGATIVE

## 2011-10-16 NOTE — Patient Instructions (Signed)
Exercise to Lose Weight Exercise and a healthy diet may help you lose weight. Your doctor may suggest specific exercises. EXERCISE IDEAS AND TIPS  Choose low-cost things you enjoy doing, such as walking, bicycling, or exercising to workout videos.   Take stairs instead of the elevator.   Walk during your lunch break.   Park your car further away from work or school.   Go to a gym or an exercise class.   Start with 5 to 10 minutes of exercise each day. Build up to 30 minutes of exercise 4 to 6 days a week.   Wear shoes with good support and comfortable clothes.   Stretch before and after working out.   Work out until you breathe harder and your heart beats faster.   Drink extra water when you exercise.   Do not do so much that you hurt yourself, feel dizzy, or get very short of breath.  Exercises that burn about 150 calories:  Running 1  miles in 15 minutes.   Playing volleyball for 45 to 60 minutes.   Washing and waxing a car for 45 to 60 minutes.   Playing touch football for 45 minutes.   Walking 1  miles in 35 minutes.   Pushing a stroller 1  miles in 30 minutes.   Playing basketball for 30 minutes.   Raking leaves for 30 minutes.   Bicycling 5 miles in 30 minutes.   Walking 2 miles in 30 minutes.   Dancing for 30 minutes.   Shoveling snow for 15 minutes.   Swimming laps for 20 minutes.   Walking up stairs for 15 minutes.   Bicycling 4 miles in 15 minutes.   Gardening for 30 to 45 minutes.   Jumping rope for 15 minutes.   Washing windows or floors for 45 to 60 minutes.  Document Released: 11/18/2010 Document Revised: 06/28/2011 Document Reviewed: 11/18/2010 [employer] Patient Information 2012 [employer].                                           Dietary therapy for weight gain   INTRODUCTION - The optimal management of overweight and obesity requires a combination of diet, exercise, and behavioral modification. In addition, some patients  eventually require pharmacologic therapy or bariatric surgery. The risk of overweight to the subject should be evaluated before beginning any treatment program. Selection of treatment can then be made using a risk-benefit assessment). The choice of therapy is dependent on several factors including the degree of overweight or obesity and patient preference.  This topic will review the dietary therapy of obesity. Other aspects of treatment are discussed separately. (See "Health hazards associated with obesity in adults" and "Overview of therapy for obesity in adults" and "Drug therapy of obesity" and "Behavioral strategies in the treatment of obesity".) GOALS OF WEIGHT LOSS - It is important to set goals when discussing a dietary weight loss program with an individual patient. An initial weight loss goal of 5 to 7 percent of body weight is realistic for most individuals. The first goal for any overweight individual is to prevent further weight gain and keep body weight stable (within 5 pounds of its current level).  The goal of the clinician is to identify and review with the patient a realistic weight-loss goal. Most patients have a weight loss goal of 30 percent or more below current weight, which   is unrealistic [1].  A successful program will lead to a weight loss of more than 5 percent of initial weight [2]. A weight loss of more than 5 percent can reduce risk factors for cardiovascular disease, such as dyslipidemia, hypertension, and diabetes mellitus [3]. In the Diabetes Prevention Program, a multi-center trial in patients with impaired glucose tolerance, weight loss of 7 percent reduced the rate of progression from impaired glucose tolerance to diabetes by 58 percent [4]. (See "Prediction and prevention of type 2 diabetes mellitus", section on 'Diabetes Prevention Program'.)  Loss of 5 percent of initial body weight and maintenance of this loss is a good medical result, even if the subject does not reach  his or her "dream" weight.  Although an extremely difficult goal to achieve, a body mass index (BMI) between 20 and 25 kg/m2 puts the subject in the lowest risk category (table 1 and figure 1). DIETARY ENERGY Rate of weight loss - The rate of weight loss is directly related to the difference between the subject's energy intake and energy requirements. Reducing caloric intake below expenditure results in a predictable initial rate of weight loss that is related to the energy deficit [5,6]. However, prediction of weight loss for an individual subject can be difficult because of marked intersubject variability in initial body composition, adherence, and energy expenditure [5,7]. Food records are often inaccurate. Most normal-weight people under-report what they eat by 10 to 30 percent, while overweight people under-report by 30 percent or more [8]. In addition, energy requirements are influenced by fidgeting, gender, age, and genetic factors [5,6,9]. As examples: Men lose more weight than women of similar height and weight when they comply with eating any given diet because men have more lean body mass, less percent body fat, and therefore higher energy expenditure.  Older subjects of either sex have a lower energy expenditure and therefore lose weight more slowly than younger subjects; metabolic rate declines by approximately 2 percent per decade (about 100 kcal/decade) [10].  The importance of genetic factors is illustrated by a study of identical female twin pairs who were overfed to induce weight gain [11]. Twelve twin pairs were overfed by 1000 kcal/day for 84 of 100 days. The degree of weight gain at a constant dietary caloric increment varied widely among the twin pairs (from 4.3 to 13.3 kg), in fact, there was three times the variance for both weight and fat mass among the twin pairs when compared with that within the twin pairs. Approximately 22 kcal/kg is required to maintain a kilogram of body weight in  a normal adult. Thus, the expected or calculated energy expenditure for a woman weighing 100 kg is approximately 2200 kcal/day. The variability of 20 percent could give energy needs as high as 2620 kcal/day or as low as 1860 kcal/day. An average deficit of 500 kcal/day should result in an initial weight loss of approximately 0.5 kg/week (1 lb/week). However, after three to six months of weight loss, energy expenditure adaptations occur, which slow the bodyweight response to a given change in energy intake, thereby diminishing ongoing weight loss [7]. There are several methods of formally estimating energy expenditure; we suggest using the WHO criteria (table 2). This method allows a direct estimate of resting metabolic rate (RMR) and calculation of daily energy requirement. The low activity level (1.3 x RMR) includes subjects who lead a sedentary life. The high activity level (1.7 x RMR) applies to those in jobs requiring manual labor or patients with regular daily physical exercise   programs [12]. Maintenance of weight loss - It is important for the overweight subject to understand that achieving and maintaining weight loss is made difficult by the reduction in energy expenditure that is induced by weight loss (figure 2) [13]. Weight loss maintenance is also difficult because of changes in the peripheral hormone signals that regulate appetite. Gastrointestinal peptides, such as ghrelin, which stimulates appetite, and gastric inhibitory polypeptide, which may promote energy storage, increase after diet-induced weight loss. Other circulating mediators that inhibit intake (eg, leptin, peptide YY, cholecystokinin, pancreatic polypeptide) decrease. These hormonal adaptations favoring weight gain persist for at least one year after diet-induced weight loss [14]. (See "Overview of therapy for obesity in adults", section on 'Maintenance of weight loss' and "Pathogenesis of obesity", section on 'Ghrelin'.)  TYPES OF  DIETS - The general consensus is that excess intake of calories from any source, associated with a sedentary lifestyle, causes weight gain and obesity. The goal of dietary therapy, therefore, is to decrease energy intake from food. Conventional diets are defined as those below energy requirements but above 800 kcal/day [15]. These diets fall into four groups: Balanced low-calorie diets/portion-controlled diets  Low-fat diets  Low-carbohydrate diets  Mediterranean diet  Fad diets (diets involving unusual combinations of foods or eating sequences) Commercial weight loss programs and internet-based programs are discussed elsewhere. (See "Behavioral strategies in the treatment of obesity".) Balanced low-calorie diets - Planning a diet requires the selection of a caloric intake and then selection of foods to meet this intake. It is desirable to eat foods with adequate nutrients in addition to protein, carbohydrate, and essential fatty acids. Thus, weight-reducing diets should eliminate alcohol, sugar-containing beverages, and most highly concentrated sweets because they rarely contain adequate amounts of other nutrients besides energy. Breakdown of some protein is to be expected during weight loss. When weight increases as a result of overeating, approximately 75 percent of the extra energy is stored as fat and the remaining 25 percent as lean tissue. If the lean tissue contains 20 percent protein, then 5 percent of the extra weight gain would be protein. Thus, it should be anticipated that during weight loss, at least 5 percent of weight loss will be protein. A desirable feature of any calorie restricted diet, however, is that it results in the lowest possible loss of protein, recognizing that this will not be less than 5 percent of the weight that is lost. Portion-controlled diets - One simple approach to providing a calorie-controlled diet is to use individually packaged foods, such as formula diet drinks  using powdered or liquid formula diets, nutrition bars, frozen food, and pre-packaged meals that can be stored at room temperature as the main source of nutrients. Frozen low-calorie meals containing 250 to 350 kcal/package can be a convenient and nutritious way to do this. We have often recommended the use of formula diets or breakfast bars for breakfast, formula diets or a frozen lunch entree for lunch, and a frozen calorie-controlled entree with additional vegetables for dinner. In this way, it is possible to obtain a calorie-controlled 1000 to 1500 kcal per day diet. In one four-year study this approach resulted in early initial weight loss, which then was maintained [16]. I do not recommend the use of formula diets alone because they do not provide adequate nutritional variety. Low-fat diets - Low-fat diets are another standard strategy to help patients lose weight, and almost all dietary guidelines recommend a reduction in the daily intake of fat to 30 percent of energy intake or less [  17,18]. In a meta-analysis of trials comparing low-fat diets (typically 20 to 25 percent of energy from fats) with a control group consuming a usual diet or a medium fat diet (usually 35 to 40 percent of energy), there was greater weight loss (approximately 3 kg) with low-fat compared with moderate fat diets [19]. In addition, one report noted that people who successfully keep their weight reduced adopt three strategies, one of which is eating a lower fat diet [20]. (See "Dietary fat" and "Etiology and natural history of obesity", section on 'Dietary habits'.) A low-fat dietary pattern with healthy carbohydrates is not associated with weight gain. This was illustrated by the [facility] Dietary Modification Trial of [phone] postmenopausal women over age 50 years who were randomly assigned to a dietary intervention that included group and individual sessions to promote a decrease in fat intake and increases in  fruit, vegetable, and grain consumption (healthy carbohydrates), but did not include weight loss or caloric restriction goals, or a control group which received only dietary educational materials [21]. After an average of 7.5 years of follow-up, the following results were seen: Women in the intervention group lost weight in the first year (mean of 2.2 kg) and maintained lower weight than the control women at 7.5 years (difference of 1.9 kg at one year, and 0.4 kg at 7.5 years).  No tendency toward weight gain was seen in the intervention group overall, or when stratified by age, ethnicity, or body mass index.  Weight loss was related to the level of fat intake and was greatest in women who decreased their percentage of energy from fat the most. A similar, but lesser trend was seen with increased vegetable and fruit intake. A low-fat diet can be implemented in two ways. First, the dietitian can provide the subject with specific menu plans that emphasize the use of reduced fat foods. As one guideline, if a food "melts" in your mouth, it probably has fat in it. Second, subjects can be instructed in counting fat grams as an alternative to counting calories. Fat has 9.4 kcal/g. It is thus very easy to calculate the number of grams of fat a subject can eat for any given level of energy intake. Many experts recommend keeping calories from fat to below 30 percent of total calories. In practical terms, this means eating about 33 g of fat for each 1000 calories in the diet. For simplicity, I use 30 g of fat or less for each 1000 kcal. For a 1500-calorie diet, this would mean about 45 g or less of fat, which can be counted using the nutrition information labels on food packages. Low-carbohydrate diets - Proponents of low-carbohydrate diets have argued that the increasing obesity epidemic may be in part due to low-fat, high-carbohydrate diets. But this may be dependent upon the type of carbohydrates that are eaten, such  as energy dense snacks and sugar or high fructose containing beverages. The carbohydrate content of the diet is an important determinant of short-term (less than two weeks) weight loss. Low (60 to 130 grams of carbohydrates) and very low-carbohydrate diets (0 to <60 grams) have been popular for many years [15]. Restriction of carbohydrates leads to glycogen mobilization and, if carbohydrate intake is less than 50 g/day, ketosis will develop. Rapid weight loss occurs, primarily due to glycogen breakdown and fluid loss rather than fat loss. Low and very low-carbohydrate diets are more effective for short-term weight loss than low-fat diets, although probably not for long-term weight loss. A meta-analysis   of five trials found that the difference in weight loss at six months, favoring the low carbohydrate over low fat diet, was not sustained at 12 months [22]. (See 'Comparison trials' below.) Low-carbohydrate diets may have some other beneficial effects with regard to risk of developing type 2 diabetes mellitus, coronary heart disease, and some cancers, particularly if attention is paid to the type as well as the quantity of carbohydrate. A low-carbohydrate diet can be implemented in two ways, either by reducing the total amount of carbohydrate or by consuming foods with a lower glycemic index or glycemic load (table 3). Glycemic index and load are reviewed separately. (See "Dietary carbohydrates", section on 'Glycemic index'.) If a low-carbohydrate diet is chosen, healthy choices for fat (mono- and polyunsaturated fats) and protein (fish, nuts, legumes, and poultry) should be encouraged because of the association between saturated fat intake and risk of coronary heart disease. During 26 years of follow-up of women in the Nurses' Health Study and 20 years of follow-up of men in the Health Professionals' Follow-up Study, low carbohydrate diets in the highest versus lowest decile for vegetable proteins and fat were  associated with lower all-cause mortality (HR 0.80, 95% CI 0.75-0.85) and cardiovascular mortality (HR 0.77, 95% CI 0.68-0.87) [23]. In contrast, low carbohydrate diets in the highest versus lowest decile for animal protein and fat were associated with higher all-cause (HR 1.23, 95% CI 1.11-1.37) and cardiovascular (HR 1.14, 95% CI 1.01-1.29) mortality. (See "Dietary fat" and "Overview of primary prevention of coronary heart disease and stroke", section on 'Healthy diet'.) High protein diets - Some popular books recommend high protein diets [24]. In one trial, low-fat diets with 12 percent and 25 percent protein content were compared. Weight loss over six months was greater with the higher protein diet (9 versus 5 kg), but the difference was no longer significant at 12 and 24 months [25]. Higher protein diets may improve weight maintenance, as illustrated by the results of a study of 60 subjects randomly assigned to a low fat, high protein versus low-fat, high-carbohydrate diet after completing a four week very low calorie diet [26]. Among the subjects who completed the three-month study (n = 48), the high protein diet group had significantly better weight maintenance (between group difference of 2.3 kg). High dietary protein intake, due to its acid-producing load, increases urinary calcium excretion (with potential risk for bone loss and calcium stone formation) [27]. Urinary calcium excretion does appear to increase when dietary intake of protein increases [27-29], and this could pose a long-term risk for nephrolithiasis. (See "Risk factors for calcium stones in adults", section on 'Dietary risk factors'.) However, two small randomized trials that looked at bone metabolism found evidence that increased dietary protein may decrease bone resorption [28,29]. One of the trials found that increased intestinal absorption of calcium was primarily responsible for the increased urinary excretion of calcium and that the  excreted calcium was not coming from bone [29]. Mediterranean diet - The term Mediterranean diet refers to a dietary pattern that is common in [place] areas of the [place] area. Although there is some variation in Mediterranean diets, there are some common components that include a high level of monounsaturated fat relative to saturated; moderate consumption of alcohol, mainly as wine; a high consumption of vegetables, fruits, legumes, and grains; a moderate consumption of milk and dairy products, mostly in the form of cheese; and a relatively low intake of meat and meat products. A meta-analysis of 12 studies involving eight cohorts found that a   Mediterranean diet was associated with improved health status and reductions in overall mortality, cardiovascular mortality, cancer mortality, and incidence of Parkinson's disease and Alzheimer's disease [30]. (See "Healthy diet in adults", section on 'Mediterranean diet'.) Very low-calorie diets - Diets with energy levels between 200 and 800 kcal/day are called "very low-calorie diets," while those below 200 kcal/day can be termed starvation diets. The basis for these diets was the notion that the lower the calorie intake the more rapid the weight loss, because the energy withdrawn from body fat stores is a function of the energy deficit. Starvation is the ultimate very low-calorie diet and results in the most rapid weight loss. Although once popular, starvation diets are now rarely used for treatment of obesity. Very low-calorie diets have not been shown to be superior to conventional diets for long-term weight loss. In a meta-analysis of six trials comparing very low-calorie diets with conventional low-calorie diets, short-term weight loss was greater with very low-calorie diets (16.1 versus 9.7 versus percent of initial weight), but there was no difference in long-term weight loss (6.3 versus 5.0 percent) [31]. As with all diets, very low-calorie diets  initially result in substantial protein loss that diminishes with time. Other expected effects include reduction in blood pressure and improvement in hyperglycemia in diabetic patients. Subjects adhering to very low-calorie diets usually have a fall in blood pressure, especially during the first week. Antihypertensive drugs, especially calcium channel blockers and diuretics, should usually be discontinued when a very low calorie diet is begun unless moderate to severe hypertension is present.  Most diabetic patients eating very low-calorie diets have marked improvement in hyperglycemia. Blood glucose concentrations fall within the first one to two weeks, and remain lower as long as the diet is continued. Those patients taking less than 50 units of insulin or an oral hypoglycemic drug will usually be able to discontinue therapy [32]. The side effects of very low-calorie diets include hair loss, thinning of the skin, and coldness. These diets are contraindicated for lactating and pregnant women, and in children who require protein for linear growth. As with all diets, there is increased cholesterol mobilization from peripheral fat stores, thus increasing the risk of gallstones. Very low-calorie diets should be reserved for subjects who require rapid weight loss for a specific purpose, such as surgery. The weight regain when the diet is stopped is often rapid, and it is better to take a more sustainable approach than to use a method that cannot be sustained. Comparison trials - The impact of specific dietary composition on weight change remains uncertain. When energy from dietary carbohydrates decreases, energy from fat sources tends to increase. The reverse is also true; when energy from dietary fats decreases, energy from carbohydrate sources tends to increase. The debate has mainly centered on whether low-fat or low-carbohydrate diets can better induce weight loss and sustain it over the long-term. Weight loss  diets - Initial trials evaluating the effect of type of diet (predominantly low-carbohydrate versus low-fat) on weight loss and other outcomes showed that weight loss at six months was approximately 4 kg greater in the very low-carbohydrate group than in the low-fat group [33-35]. Trials lasting for one year, however, did not find a significant difference in weight loss [34,36,37]. A meta-analysis of five trials (including one study not referenced above) found that the difference in weight loss at six months, favoring the low carbohydrate over low fat diet, was not sustained at 12 months [22]. In one study, this convergence was mainly due to   regain of weight in the low-carbohydrate group [34]; in another, the convergence was due to ongoing weight loss in the low-fat group (figure 3) [36]. Some of these initial comparison trials of different dietary regimens had important limitations [22]. These included high dropout rates (21 to 48 percent), suboptimal dietary compliance, and limited long-term follow-up. Subsequent trials are larger, of longer duration (lasting one to two years), and have conflicting results with regard to the impact of macronutrient composition on weight loss [38-41]. In contrast, all trials found that dietary adherence is an important determinant of weight loss, independent of macronutrient composition. The following observations illustrate the range of findings in these trials: In one trial, 322 moderately obese subjects (86 percent men) were randomly assigned to a low-fat (restricted calorie), Mediterranean (moderate-fat, restricted calorie, rich in vegetables, low in red meat), or low-carbohydrate (non-restricted-calorie) diet for two years [38]. Adherence rates were higher than those reported in previous trials (95.4 and 84.6 percent at one and two years, respectively). Weight loss was greater with the Mediterranean and low-carbohydrate diets than the low-fat diet (mean weight loss 4.4,  4.7, and 2.9 kg, respectively).  The most favorable effect on lipids (increased HDL and decreased triglycerides and ratio of total cholesterol to HDL) was seen in the low-carbohydrate group. Among subjects with type 2 diabetes, the greatest improvement in glycemic control occurred with the Mediterranean diet. Among all groups, weight loss was greater for those who completed the two year study than for those who withdrew.  Another randomized trial compared four different diets in 311 overweight and obese premenopausal women: very low-carbohydrate (Atkins); macronutrient balance controlling glycemic load ([facility]); general calorie restriction, low-fat ([facility]); and very low-fat ([place]) [39]. In the intention-to-treat analysis at one year, mean weight loss was greater in the Atkins diet group compared with the other groups (4.7, 1.6, 2.2, and 2.6 kg, respectively). Pairwise comparisons showed a significant difference only for Atkins versus Zone.  The most favorable effect on triglycerides and HDL-C was seen in the Atkins group. Dietary adherence rates (77 to 88 percent) were similar among the groups and better than in previous trials. Within each group, adherence was significantly associated with weight loss [42].  In the largest trial to date, 811 overweight and obese adults were randomly assigned to one of four diets based upon macronutrient content: low or high fat (20 to 40 percent), which provided carbohydrate at 35, 45, 55, or 65 percent, and high or average protein (15 to 25 percent) [40]. After six months, mean weight loss in each group was 6 kg. By two years, mean weight loss was 3 to 4 kg, and weight losses remained similar in all groups. Many participants had trouble attaining target levels of macronutrients. Subjects who attended the greatest number of group sessions (most adherent) lost the most weight. Thus, any diet that is adhered to will produce modest weight loss, but adherence rates are low with  most diets. Although a low-carbohydrate diet may be associated with greater short-term weight loss, superior weight loss in the long-term has not been established. The optimal mix of macronutrients likely depends upon individual factors [43]. A principal determinant of weight loss appears to be the degree of adherence to the diet, irrespective of the particular macronutrient composition [37,39,40,42,44,45]. Thus, we suggest choosing a macronutrient mix based upon patient preferences, which may improve long-term adherence. Behavioral modification to improve dietary compliance with any type of diet may have the greatest impact on long-term weight loss. (See "Behavioral strategies in the treatment   of obesity".) Lipids - The observed effects on blood lipids were similar for trials comparing low fat and very low carbohydrate diets [22,33-36,46]; the low-carbohydrate/high-fat diets caused slight increases in HDL, and greater decreases in fasting triglycerides. At 12 to 24 months, however, the favorable effects on HDL persisted [34,36,37,41], while triglyceride levels were either reduced [34,36] or returned to baseline [37]. In a meta-analysis of trials comparing low-carbohydrate and low-fat diet groups, LDL levels were increased in the low-carbohydrate group [22]. There was no clear benefit of either low-fat or low-carbohydrate diet on cardiovascular risks. Favorable changes in HDL cholesterol and triglycerides should be weighed against potential unfavorable changes in LDL cholesterol.  Side effects - Very low-carbohydrate diets may be associated with more frequent side effects than low-fat diets. In one of the trials noted above, a number of symptoms occurred significantly more frequently in the low-carbohydrate compared to the low-fat diet group [33]. These included constipation (68 versus 35 percent), headache (60 versus 40 percent), halitosis (38 versus 8 percent), muscle cramps (35 versus 7 percent), diarrhea (23  versus 7 percent), general weakness (25 versus 8 percent), and rash (13 versus 0 percent) [33]. Despite the higher rate of symptoms, dropout rates in clinical trials have been similar for low-carbohydrate and low-fat diets [34-36]. Some have raised the concern about ketosis that occurs with very low-carbohydrate diets. There is one case report of an obese patient who presented in severe ketoacidosis, having lost 9 kg in one month on the Atkins diet, with intake restricted to meat, cheese, and salads [47]. Aside from her diet and possible mild dehydration due to gastroenteritis, no other cause for her ketoacidosis was identified. Weight maintenance diets - Although many individuals have success losing weight with diet, most subsequently regain much or all of the lost weight. Maintaining weight loss is made difficult by the reduction in energy expenditure that is induced by weight loss. In addition, long-term adherence to restrictive diets is difficult. Exercise and behavioral interventions may help individuals maintain weight loss. These strategies are reviewed in detail elsewhere. (See "Role of physical activity and exercise in obesity", section on 'Maintenance of weight loss' and "Behavioral strategies in the treatment of obesity", section on 'Maintenance of weight loss'.) There is little consensus on the optimal mix of macronutrients to maintain weight loss. The satiating effects of high protein, low glycemic index diets have generated interest in manipulating protein composition and glycemic index in weight maintenance diets. (See 'High protein diets' above and "Dietary carbohydrates", section on 'Effect of glycemic index/glycemic load'.) In a multicenter trial of five ad libitum diets to prevent weight regain over 26 weeks, 773 adults who had successfully lost 8 percent of their body weight on a low calorie diet (800 to 1000 kcal/day), were randomly assigned in a two-by-two factorial design to a high or  low-protein (25 versus 13 percent of total calories), high or low-glycemic index, or to a control diet (moderate protein content) [48]. All diets had a moderate fat content (25 to 30 percent). The achieved protein content was 5 percentage points higher in the high versus low protein groups, and the mean glycemic index was five units lower in the low-glycemic versus high-glycemic index groups. In the intention-to-treat analysis, weight regain during the trial was modestly but significantly greater in the low versus high-protein groups (mean difference 0.93 kg) and in the high versus low-glycemic index groups (mean difference 0.95 kg). Only subjects in the high-protein, low-glycemic index diet group continued to lose weight (mean change -0.38 kg).   The trial was limited by the moderate dropout rate (29 percent) and short-term follow-up (six months). Whether a low glycemic index, high protein diet is associated with long-term weight maintenance is unknown. As discussed above, long-term adherence to a weight maintaining diet is probably the most important determinant of success, and therefore the optimal weight maintaining diet will depend upon preference and individual factors. Role of dietary counseling - Dietary counseling may produce modest, short-term weight losses. This topic is reviewed in detail elsewhere. (See "Behavioral strategies in the treatment of obesity", section on 'Elements of behavioral strategies' and "Behavioral strategies in the treatment of obesity", section on 'Efficacy'.)  Prolonged caloric restriction and longevity - Prolonged caloric restriction improves longevity in rodents and non-human primates [49], but it is not known if the same is true in humans. It is hypothesized that the antiaging effects of caloric restriction are due to reduced energy expenditure resulting in a reduction in production of reactive oxygen species (and therefore a reduction in oxidative damage). In addition, other  metabolic effects associated with caloric restriction, such as improved insulin sensitivity, might also have an antiaging effect. In one trial of 48 sedentary, overweight men and women, six months of caloric restriction, with or without exercise, resulted in significant weight loss as expected [50]. In addition, calorie restriction-mediated reductions in fasting insulin concentrations, core body temperature, serum T3 levels, and oxidative damage to DNA (as reflected by a reduction in DNA fragmentation) were seen, suggesting a possible antiaging effect of the prolonged caloric restriction. INFORMATION FOR PATIENTS - UpToDate offers two types of patient education materials, "The Basics" and "Beyond the Basics." The Basics patient education pieces are written in plain language, at the 5th to 6th grade reading level, and they answer the four or five key questions a patient might have about a given condition. These articles are best for patients who want a general overview and who prefer short, easy-to-read materials. Beyond the Basics patient education pieces are longer, more sophisticated, and more detailed. These articles are written at the 10th to 12th grade reading level and are best for patients who want in-depth information and are comfortable with some medical jargon. Here are the patient education articles that are relevant to this topic. We encourage you to print or e-mail these topics to your patients. (You can also locate patient education articles on a variety of subjects by searching on "patient info" and the keyword(s) of interest.)  Basics topics (see "Patient information: Diet and health (The Basics)" and "Patient information: Weight loss treatments (The Basics)")  Beyond the Basics topics (see "Patient information: Diet and health (Beyond the Basics)" and "Patient information: Weight loss treatments (Beyond the Basics)" and "Patient information: Weight loss surgery (Beyond the Basics)")  SUMMARY  AND RECOMMENDATIONS An initial weight loss goal of 5 to 7 percent of body weight is realistic for most individuals. (See 'Goals of weight loss' above.)  Many types of diets produce modest weight loss. Options include balanced low-calorie, low-fat low-calorie, moderate-fat low calorie, low-carbohydrate diets, and the Mediterranean diet. Dietary adherence is an important predictor of weight loss, irrespective of the type of diet. (See 'Types of diets' above.)  We suggest tailoring a diet that reduces energy intake below energy expenditure to individual patient preferences, rather than focusing on the macronutrient composition of the diet (Grade 2B). (See 'Comparison trials' above.)  If a low-carbohydrate diet is chosen, healthy choices for fat (mono and polyunsaturated) and protein (fish, nuts, legumes, and poultry) should be encouraged. If a   low-fat diet is chosen, the decrease in fat should be accompanied by increases in healthy carbohydrates (fruits, vegetables, whole grains).   

## 2011-10-16 NOTE — Progress Notes (Signed)
Kristen Murray 07/05/1946 [id number]   History:    65 y.o.  for annual exam with some minor complaints of vaginal dryness at times during intercourse. Patient prior history of TAH BSO secondary to fibroid uterus. Recently this year had sprained ankle was from a fall. States her bone density study is up-to-date. Colonoscopy October 2011 normal. Mammogram scheduled for this month. Patient does her monthly self breast examination. Her primary physician is Dr. Alwyn Ren.  Past medical history,surgical history, family history and social history were all reviewed and documented in the EPIC chart.  Gynecologic History Patient's last menstrual period was 07/30/1990. Contraception: none Last Pap: 2011. Results were: normal Last mammogram: 2011. Results were:normal}  Obstetric History OB History    Grav Para Term Preterm Abortions TAB SAB Ect Mult Living   3 3        3      # Outc Date GA Lbr Len/2nd Wgt Sex Del Anes PTL Lv   1 PAR            2 PAR            3 PAR                ROS:  Was performed and pertinent positives and negatives are included in the history.  Exam: chaperone present  BP 138/72  Ht 5\' 5"  (1.651 m)  Wt 173 lb 8 oz (78.699 kg)  BMI 28.87 kg/m2  LMP 07/30/1990  Body mass index is 28.87 kg/(m^2).  General appearance : Well developed well nourished female. No acute distress HEENT: Neck supple, trachea midline, no carotid bruits, no thyroidmegaly Lungs: Clear to auscultation, no rhonchi or wheezes, or rib retractions  Heart: Regular rate and rhythm, no murmurs or gallops Breast:Examined in sitting and supine position were symmetrical in appearance, no palpable masses or tenderness,  no skin retraction, no nipple inversion, no nipple discharge, no skin discoloration, no axillary or supraclavicular lymphadenopathy Abdomen: no palpable masses or tenderness, no rebound or guarding Extremities: no edema or skin discoloration or tenderness  Pelvic:  Bartholin, Urethra, Skene  Glands: Within normal limits             Vagina: No gross lesions or discharge  Cervix: Absent  Uterus absent Adnexa  Without masses or             tenderness  Anus and perineum  normal   Rectovaginal  normal sphincter tone without palpated             masses or tenderness             Hemoccult obtained pending at time of this dictation     Assessment/Plan:  65 y.o. female for annual exam postmenopausal. Normal exam today. Prior history of TAH BSO. Discussed the new screening guidelines from the American Cancer Society, the American society for colposcopy and cervical pathology which states that women with previous hysterectomy do not need screening. Patient with no prior history of cervical or vaginal dysplasia. Pap smear was done today: On the evening further cytology in the future. She will stop by the lab we'll check her CBC cholesterol TSH. Urinalysis recently done by Dr. Logan Bores her urologist would see her for suspected IC. Any recent hemoglobin A1c by her primary physician Dr. Alwyn Ren was normal. Literature formation was provided on exercise and diet. She is going to check with Dr. Alwyn Ren as to her last bone density study so that it can be scheduled accordingly. It was stressed upon  her the importance of monthly self breast examination and regular exercise along with calcium and vitamin D for osteoporosis prevention. Her flu shots up-to-date.   Ok Edwards MD, 10:34 AM 10/16/2011

## 2011-10-16 NOTE — Progress Notes (Signed)
Addended byCammie Mcgee T on: 10/16/2011 11:25 AM   Modules accepted: Orders

## 2011-10-18 ENCOUNTER — Other Ambulatory Visit: Payer: Self-pay | Admitting: *Deleted

## 2011-10-18 DIAGNOSIS — E7800 Pure hypercholesterolemia, unspecified: Secondary | ICD-10-CM

## 2011-10-20 ENCOUNTER — Other Ambulatory Visit (INDEPENDENT_AMBULATORY_CARE_PROVIDER_SITE_OTHER): Payer: BC Managed Care – PPO | Admitting: *Deleted

## 2011-10-20 DIAGNOSIS — E7800 Pure hypercholesterolemia, unspecified: Secondary | ICD-10-CM

## 2011-11-06 ENCOUNTER — Encounter: Payer: Self-pay | Admitting: Gynecology

## 2011-11-07 ENCOUNTER — Encounter: Payer: Self-pay | Admitting: Internal Medicine

## 2011-12-18 ENCOUNTER — Ambulatory Visit (INDEPENDENT_AMBULATORY_CARE_PROVIDER_SITE_OTHER): Payer: 59 | Admitting: Internal Medicine

## 2011-12-18 ENCOUNTER — Encounter: Payer: Self-pay | Admitting: Internal Medicine

## 2011-12-18 DIAGNOSIS — J069 Acute upper respiratory infection, unspecified: Secondary | ICD-10-CM

## 2011-12-18 DIAGNOSIS — J209 Acute bronchitis, unspecified: Secondary | ICD-10-CM

## 2011-12-18 MED ORDER — BENZONATATE 200 MG PO CAPS: 200.0000 mg | ORAL_CAPSULE | Freq: Three times a day (TID) | ORAL | Status: AC | PRN

## 2011-12-18 MED ORDER — CEFUROXIME AXETIL 500 MG PO TABS: 500.0000 mg | ORAL_TABLET | Freq: Two times a day (BID) | ORAL | Status: AC

## 2011-12-18 NOTE — Progress Notes (Signed)
  Subjective:    Patient ID: Kristen Murray, female    DOB: [birth year], 67 y.o.   MRN: [id number]  HPI Respiratory tract infection Onset/symptoms:PNDrainage since 09/2011; treated by ENT. Recurrence 3 weeks ago. Exposures (illness/environmental/extrinsic):no Progression of symptoms:to cough as of 10 days ago Treatments/response:OTC cough drops; husband's cough med/ minimal help Present symptoms: Fever/chills/sweats:no Frontal headache:R frontal  Facial pain:R facial Nasal purulence:scant yellow Sore throat:yes Dental pain:R maxillary area Lymphadenopathy:no Wheezing/shortness of breath:some SOB Cough/sputum/hemoptysis:NP cough Associated extrinsic/allergic symptoms:itchy eyes/ sneezing:sneezing & eyes tearing Past medical history: Seasonal allergies : yes/asthma:no Smoking history:never           Review of Systems     Objective:   Physical Exam General appearance:good health ;well nourished; no acute distress or increased work of breathing is present.  No  lymphadenopathy about the head, neck, or axilla noted.   Eyes: No conjunctival inflammation or lid edema is present. Vision intact  Ears:  External ear exam shows no significant lesions or deformities.  Otoscopic examination reveals clear canals, tympanic membranes are intact bilaterally without bulging, retraction, inflammation or discharge.  Nose:  External nasal examination shows no deformity or inflammation. Nasal mucosa are dry & erythematous  without lesions or exudates. No septal dislocation or deviation.No obstruction to airflow.  Hyponasal speech  Oral exam: Dental hygiene is good; lips and gums are healthy appearing.There is no oropharyngeal erythema or exudate noted.      Heart:  Normal rate and regular rhythm. S1 and S2 normal without gallop,  click, rub or other extra sounds. Grade 1/6 systolic murmur   Lungs:Chest clear to auscultation; no wheezes, rhonchi,rales ,or rubs present.No increased work of  breathing.    Extremities:  No cyanosis, edema, or clubbing  noted    Skin: Warm & dry w/o jaundice or tenting.          Assessment & Plan:    #1 bronchitis, acute without bronchospasm  #2 upper respiratory tract infection with scant purulent secretions  Plan: See orders and recommendations

## 2011-12-18 NOTE — Patient Instructions (Signed)
Plain Mucinex for thick secretions ;force NON dairy fluids . Use a Neti pot daily as needed for sinus congestion. Nasal cleansing in the shower as discussed. Make sure that all residual soap is removed to prevent irritation.  

## 2011-12-20 DIAGNOSIS — N3010 Interstitial cystitis (chronic) without hematuria: Secondary | ICD-10-CM | POA: Insufficient documentation

## 2012-01-02 ENCOUNTER — Telehealth: Payer: Self-pay

## 2012-01-02 NOTE — Telephone Encounter (Signed)
Left message on voicemail informing patient to return call when available  

## 2012-01-02 NOTE — Telephone Encounter (Signed)
[facility] Triage Call Report Triage Record Num: [id number] Operator: Arline Asp Loftin Patient Name: Kristen Murray Call Date & Time: 01/01/2012 3:51:30PM Patient Phone: [phone] PCP: Marga Melnick Patient Gender: Female PCP Fax : [phone] Patient DOB: 10/08/1946 Practice Name: [facility] Reason for Call: Caller: Tyasia/Mother; PCP: Marga Melnick; CB#: [phone]; Call regarding DX With Bronchitis and Now Has Aching in Her Chest; dx with bronchitis on 02/18, started 10 day course of abx, Tessalon Perles. Has periodically felt aching in chest and back, more with taking in deep breath. Very mild cough. Pain is 3 out of 10. Felt worse after carrying a box up 15 steps. Afebrile. Emergent sx negative. Care advice per "Chest Pain" with appt advised within 72h due to "all other situations." RN offered appt, declined, pt does not want to see physician other than Dr. Alwyn Ren, will call for appt later if needed. RN advised Dr. Alwyn Ren not in office until 03/11. Advised to continue rest, fluids, humidity. Call back parameters reviewed. Protocol(s) Used: Chest Pain Recommended Outcome per Protocol: See Provider within 72 Hours Reason for Outcome: All other situations Care Advice: Talk to provider immediately if having repeated episodes pain/discomfort in lower chest or back; indigestion not relieved with antacids; shortness of breath without chest pain; or unexplained fatigue, weakness or anxiety. ~ 03/04/  **Patient was offered appointment, Refused to see anyone other than Dr.Hopper **

## 2012-01-02 NOTE — Telephone Encounter (Signed)
Left message on voicemail for patient to return call when available   

## 2012-01-02 NOTE — Telephone Encounter (Signed)
Pt really should not wait until hopp gets back for chest pain.

## 2012-01-03 ENCOUNTER — Ambulatory Visit (INDEPENDENT_AMBULATORY_CARE_PROVIDER_SITE_OTHER)
Admission: RE | Admit: 2012-01-03 | Discharge: 2012-01-03 | Disposition: A | Discharge status: Home / Self Care | Payer: 59 | Source: Ambulatory Visit | Attending: Internal Medicine | Admitting: Internal Medicine

## 2012-01-03 ENCOUNTER — Ambulatory Visit (INDEPENDENT_AMBULATORY_CARE_PROVIDER_SITE_OTHER): Payer: 59 | Admitting: Internal Medicine

## 2012-01-03 ENCOUNTER — Encounter: Payer: Self-pay | Admitting: Internal Medicine

## 2012-01-03 DIAGNOSIS — R079 Chest pain, unspecified: Secondary | ICD-10-CM | POA: Insufficient documentation

## 2012-01-03 DIAGNOSIS — R7303 Prediabetes: Secondary | ICD-10-CM

## 2012-01-03 DIAGNOSIS — R7309 Other abnormal glucose: Secondary | ICD-10-CM

## 2012-01-03 NOTE — Assessment & Plan Note (Addendum)
66 y/o female w/ borderline DM, no smoker, no FH CAD present s w/a single episode of exertional CP in the setting of lifting a 9 pound  box . EKG NSR, no change from previous. Pain is most likely muscle skeletal (although i would expect the pain to last > than 2 minutes) but given gender and borderline DM  i can not be 100% sure . I discussed this w/ the patient , we agreed to do further labs, XR and a stress test Will start ASA , see instructions

## 2012-01-03 NOTE — Progress Notes (Signed)
  Subjective:    Patient ID: Kristen Murray, female    DOB: 1946/08/12, 66 y.o.   MRN: [id number]  HPI Acute visit, pt of Dr Kristen Murray Patient was seen in 12/18/2011 with a URI, prescribed an antibiotic and cough suppressant. She gradually improved but did not feel 100%. 3 days ago, she was carrying a 8-9 pounds box and going upstairs, two thirds of the way up  she had to stop due to a moderate chest pain, in the middle of the chest, did not radiate to the arm, back or neck. The pain lasted 2-3 minutes "or maybe less", was not associated with shortness of breath, nausea or diaphoresis. Since then she is chest pain-free although she has mild ache in the anterior chest.  Past Medical History  Diagnosis Date  . HTN (hypertension)   . Hyperglycemia   . Radiculopathy   . GERD (gastroesophageal reflux disease)   . Meningioma   . Headache   . Hyperlipidemia   . Borderline abnormal TFTs   . Nephrolithiasis     Dr Logan Bores, [facility], Hx of [3][  . HSV-1 infection   . Polyp of colon    Past Surgical History  Procedure Date  . Carpal tunnel release     trigger thumb  . Rotator cuff repair     R shoulder  . Tonsillectomy   . Knee arthroscopy     Left  . Breast biopsy     Right  . Colonoscopy w/ polypectomy 07/2004  . Cystoscopy 11/2009    Neg  . Abdominal hysterectomy     TAH/BSO --fibroids, no cancer   Family History  Problem Relation Age of Onset  . Alcohol abuse Mother   . Diabetes Maternal Aunt   . Diabetes Maternal Uncle   . Diabetes Maternal Grandmother   . Tuberculosis Maternal Uncle   . Other Father     unknown  . Coronary artery disease Neg Hx       Review of Systems See HPI Respiratory symptoms are better today, coughing less. In general she is able to do all her activities of daily living without chest pain or dyspnea on exertion.     Objective:   Physical Exam  Constitutional: She is oriented to person, place, and time. She appears well-developed and well-nourished.    HENT:  Head: Normocephalic and atraumatic.  Nose: Nose normal.       Face symmetric, no TTP  Cardiovascular: Normal rate, regular rhythm and normal heart sounds.   No murmur heard.   Pulmonary/Chest: Effort normal and breath sounds normal. No respiratory distress. She has no wheezes. She has no rales.  Abdominal: Soft. Bowel sounds are normal. She exhibits no distension. There is no tenderness. There is no rebound and no guarding.  Musculoskeletal: She exhibits no edema.  Neurological: She is alert and oriented to person, place, and time.  Psychiatric: She has a normal mood and affect. Her behavior is normal. Judgment and thought content normal.      Assessment & Plan:  URI-- better

## 2012-01-03 NOTE — Assessment & Plan Note (Signed)
Labs

## 2012-01-03 NOTE — Telephone Encounter (Signed)
Patient's states she is feeling much better, patient denies CP since Monday. Patient states her only concern now is fatigue and  dry cough off/on. Patient to be seen today by Dr.PAz

## 2012-01-03 NOTE — Patient Instructions (Signed)
ER if chest pain again  Aspirin one a day Get the XR done

## 2012-01-04 LAB — CBC WITH DIFFERENTIAL/PLATELET
Basophils Relative: 0.8 % (ref 0.0–3.0)
Eosinophils Relative: 3.8 % (ref 0.0–5.0)
HCT: 39 % (ref 36.0–46.0)
Lymphs Abs: 3.4 10*3/uL (ref 0.7–4.0)
Monocytes Relative: 11.3 % (ref 3.0–12.0)
Platelets: 211 10*3/uL (ref 150.0–400.0)
RBC: 4.38 Mil/uL (ref 3.87–5.11)
WBC: 7 10*3/uL (ref 4.5–10.5)

## 2012-01-04 LAB — BASIC METABOLIC PANEL
BUN: 19 mg/dL (ref 6–23)
GFR: 109.25 mL/min (ref >60.00)
Potassium: 4.1 mEq/L (ref 3.5–5.1)
Sodium: 142 mEq/L (ref 135–145)

## 2012-01-04 LAB — HEMOGLOBIN A1C: Hgb A1c MFr Bld: 6.5 % (ref 4.6–6.5)

## 2012-01-09 ENCOUNTER — Telehealth: Payer: Self-pay | Admitting: Internal Medicine

## 2012-01-09 DIAGNOSIS — G43909 Migraine, unspecified, not intractable, without status migrainosus: Secondary | ICD-10-CM | POA: Insufficient documentation

## 2012-01-09 NOTE — Telephone Encounter (Signed)
Discuss with patient.   Advise patient: CXR ok

## 2012-01-09 NOTE — Telephone Encounter (Signed)
Patient is returning Felicia's call for last lab results. Call home # [phone]

## 2012-01-15 ENCOUNTER — Encounter (HOSPITAL_COMMUNITY): Payer: 59

## 2012-01-29 ENCOUNTER — Ambulatory Visit (HOSPITAL_COMMUNITY): Payer: Medicare Other | Attending: Cardiology | Admitting: Radiology

## 2012-01-29 DIAGNOSIS — R0989 Other specified symptoms and signs involving the circulatory and respiratory systems: Secondary | ICD-10-CM | POA: Insufficient documentation

## 2012-01-29 DIAGNOSIS — I10 Essential (primary) hypertension: Secondary | ICD-10-CM | POA: Insufficient documentation

## 2012-01-29 DIAGNOSIS — R0609 Other forms of dyspnea: Secondary | ICD-10-CM | POA: Insufficient documentation

## 2012-01-29 DIAGNOSIS — R079 Chest pain, unspecified: Secondary | ICD-10-CM

## 2012-01-29 DIAGNOSIS — R0602 Shortness of breath: Secondary | ICD-10-CM

## 2012-01-29 DIAGNOSIS — E785 Hyperlipidemia, unspecified: Secondary | ICD-10-CM | POA: Insufficient documentation

## 2012-01-29 DIAGNOSIS — R5381 Other malaise: Secondary | ICD-10-CM | POA: Insufficient documentation

## 2012-01-29 MED ORDER — TECHNETIUM TC 99M TETROFOSMIN IV KIT
32.0000 | PACK | Freq: Once | INTRAVENOUS | Status: AC | PRN
  Administered 2012-01-29: 32 via INTRAVENOUS

## 2012-01-29 MED ORDER — TECHNETIUM TC 99M TETROFOSMIN IV KIT
10.8000 | PACK | Freq: Once | INTRAVENOUS | Status: AC | PRN
  Administered 2012-01-29: 11 via INTRAVENOUS

## 2012-01-29 NOTE — Progress Notes (Signed)
[facility] SITE 3 NUCLEAR MED [address] [place] [address] [phone]  Cardiology Nuclear Med Study  Kristen Murray is a 66 y.o. female     MRN : [id number]     DOB: 1946-09-29  Procedure Date: 01/29/2012  Nuclear Med Background Indication for Stress Test:  Evaluation for Ischemia History:  No prior history of CAD, 12/03 Myocardial Perfusion Study-Marginal apical defect c/w thinning/soft tissue attenuation, EF=76% Cardiac Risk Factors: Hypertension and Lipids  Symptoms: Chest pain with/without exertion (last occurrence 3 weeks ago with Bronchitis),  DOE, Fatigue and Fatigue with Exertion   Nuclear Pre-Procedure Caffeine/Decaff Intake:  None NPO After: 5:30 pm   Lungs:  clear O2 Sat: 98% on room air. IV 0.9% NS with Angio Cath:  20g  IV Site: R Antecubital  IV Started by:  Stanton Kidney, EMT-P  Chest Size (in):  40 Cup Size: C  Height: 5\' 5"  (1.651 m)  Weight:  176 lb (79.833 kg)  BMI:  Body mass index is 29.29 kg/(m^2). Tech Comments:  NA    Nuclear Med Study 1 or 2 day study: 1 day  Stress Test Type:  Stress  Reading MD: Willa Rough, MD  Order Authorizing Provider:  Willow Ora, MD  Resting Radionuclide: Technetium 35m Tetrofosmin  Resting Radionuclide Dose: 10.8 mCi   Stress Radionuclide:  Technetium 41m Tetrofosmin  Stress Radionuclide Dose: 32.0 mCi           Stress Protocol Rest HR: 69 Stress HR: 164  Rest BP: 149/82 Stress BP: 220/76  Exercise Time (min): 5:30 METS: 7.0   Predicted Max HR: 154 bpm % Max HR: 106.49 bpm Rate Pressure Product: 57322   Dose of Adenosine (mg):  n/a Dose of Lexiscan: n/a mg  Dose of Atropine (mg): n/a Dose of Dobutamine: n/a mcg/kg/min (at max HR)  Stress Test Technologist: Irean Hong, RN  Nuclear Technologist:  Domenic Polite, CNMT     Rest Procedure:  Myocardial perfusion imaging was performed at rest 45 minutes following the intravenous administration of Technetium 68m Tetrofosmin. Rest ECG: NSR -  Normal EKG  Stress Procedure:  The patient performed treadmill exercise using a Bruce  Protocol for 5 minutes and 30 seconds, RPE=14. The patient stopped due to Fatigue and DOE, and complained of chest pain 3/10 immediately post exercise for a few seconds duration.  There were significant ST-T wave changes.There was a hypertensive response to exercise. There were occasional PAC's.  Technetium 41m Tetrofosmin was injected at peak exercise and myocardial perfusion imaging was performed after a brief delay. Stress ECG: There is flat ST depression with stress. This resolves and then retuns in recovery (less marked). Abnormal EKG response to stress.  QPS Raw Data Images:  Patient motion noted; appropriate software correction applied. Stress Images:  Normal homogeneous uptake in all areas of the myocardium. Rest Images:  Normal homogeneous uptake in all areas of the myocardium. Subtraction (SDS):  No evidence of ischemia. Transient Ischemic Dilatation (Normal <1.22):  1.16 Lung/Heart Ratio (Normal <0.45):  0.34  Quantitative Gated Spect Images QGS EDV:  58 ml QGS ESV:  19 ml  Impression Exercise Capacity:  Fair exercise capacity. BP Response:  Normal blood pressure response. Clinical Symptoms:  Very slight chest tightness. ECG Impression:  See the description above. Comparison with Prior Nuclear Study: No significant change from previous study  Overall Impression:  The images and the EKG changes are the same as the study of 2003. The study is normal.  LV Ejection  Fraction: 68%.  LV Wall Motion:  Normal Wall Motion  Willa Rough, MD

## 2012-02-14 ENCOUNTER — Ambulatory Visit: Payer: 59 | Admitting: Internal Medicine

## 2012-02-19 ENCOUNTER — Ambulatory Visit (INDEPENDENT_AMBULATORY_CARE_PROVIDER_SITE_OTHER): Payer: Medicare Other | Admitting: Internal Medicine

## 2012-02-19 ENCOUNTER — Encounter: Payer: Self-pay | Admitting: Internal Medicine

## 2012-02-19 VITALS — BP 118/66 | HR 72 | Temp 98.1°F | Wt 180.2 lb

## 2012-02-19 DIAGNOSIS — E1149 Type 2 diabetes mellitus with other diabetic neurological complication: Secondary | ICD-10-CM | POA: Insufficient documentation

## 2012-02-19 DIAGNOSIS — E119 Type 2 diabetes mellitus without complications: Secondary | ICD-10-CM

## 2012-02-19 DIAGNOSIS — I10 Essential (primary) hypertension: Secondary | ICD-10-CM

## 2012-02-19 DIAGNOSIS — R0789 Other chest pain: Secondary | ICD-10-CM

## 2012-02-19 NOTE — Assessment & Plan Note (Signed)
No contraindication to a graduated exercise program building up to 30-45 minutes of walking 3-4 times per week. Most important are regular exercise  and blood pressure control.

## 2012-02-19 NOTE — Progress Notes (Signed)
  Subjective:    Patient ID: Kristen Murray, female    DOB: Oct 07, 1946, 66 y.o.   MRN: [id number]  HPI Since her last visit; she's had no recurrence of chest pain. Her lab results, imaging, and stress test results were reviewed and copies of the report provided to her.    Review of Systems She is unable to give me her glucose readings. She denies polyphagia, polydipsia, or polyuria. A1c was 6.5% on no medications.  She has been wearing a cast for 4 days from her orthopedist to treat plantar fasciitis     Objective:   Physical Exam She appears healthy and well-nourished; she is in no acute distress  No carotid bruits are present.  Heart rhythm and rate are normal with Grade 1- 1.5 systolic  murmur.  Chest is clear with no increased work of breathing  There is no evidence of aortic aneurysm or renal artery bruits  She has no clubbing or edema. L ankle/ foot in cast   Pedal pulses are intact in RLE  No ischemic skin changes are present         Assessment & Plan:

## 2012-02-19 NOTE — Patient Instructions (Signed)
Once the plantar fasciitis has resolved & your Orthopedist allows; begin a graduated exercise program taking 6-8 weeks to build up to 30-45 minutes of cardiac exercise 3-4 times a week. Blood Pressure Goal  Ideally is an AVERAGE < 135/85. This AVERAGE should be calculated from @ least 5-7 BP readings taken @ different times of day on different days of week. You should not respond to isolated BP readings , but rather the AVERAGE for that week  A1c GOALS  Non diabetic adults: 5 %-6.1%  Good diabetic control: 6.2-6.5 %  Fair diabetic control: 6.6-7%  Poor diabetic control: greater than 7 % ( except with additional factors such as  advanced age; significant coronary or neurologic disease,etc). Check the A1c every 4 months if  6.5% or higher. Goals for home glucose monitoring are : fasting  or morning glucose goal of  90-150. Two hours after any meal , goal = < 180, preferably < 160. Report any low blood glucoses immediately. Eat a low-fat diet with lots of fruits and vegetables, up to 7-9 servings per day. Consume less than 30 (preferably ZERO) grams of sugar per day from foods & drinks with High Fructose Corn Syrup (HFCS) sugar as #1,2,3 or # 4 on label.Whole Foods, [employer] & [employer] do not carry products with HFCS. Follow a  low carb nutrition program such as [place] or The New Sugar Busters  to prevent Diabetes progression . White carbohydrates (potatoes, rice, bread, and pasta) have a high spike of sugar and a high load of sugar. For example a  baked potato has a cup of sugar and a  french fry  2 teaspoons of sugar. Yams, wild  rice, whole grained bread &  wheat pasta have been much lower spike and load of  sugar. Portions should be the size of a deck of cards or your palm.

## 2012-02-19 NOTE — Assessment & Plan Note (Signed)
A1 C. is adequately controlled at 6.5%. Nutrition and exercise recommended

## 2012-02-19 NOTE — Assessment & Plan Note (Signed)
Hypertensive response with exercise: Blood pressure 220/76. Blood pressure goals reviewed

## 2012-04-04 ENCOUNTER — Ambulatory Visit (INDEPENDENT_AMBULATORY_CARE_PROVIDER_SITE_OTHER): Payer: Medicare Other | Admitting: Family Medicine

## 2012-04-04 ENCOUNTER — Encounter: Payer: Self-pay | Admitting: Family Medicine

## 2012-04-04 VITALS — BP 120/74 | HR 77 | Temp 98.3°F | Wt 179.2 lb

## 2012-04-04 DIAGNOSIS — H6590 Unspecified nonsuppurative otitis media, unspecified ear: Secondary | ICD-10-CM

## 2012-04-04 MED ORDER — CETIRIZINE HCL 10 MG PO TABS: 10.0000 mg | ORAL_TABLET | Freq: Every day | ORAL | Status: DC

## 2012-04-04 NOTE — Patient Instructions (Signed)
Headache and Allergies  The relationship between allergies and headaches is unclear. Many people with allergic or infectious nasal problems also have headaches (migraines or sinus headaches). However, sometimes allergies can cause pressure that feels like a headache, and sometimes headaches can cause allergy-like symptoms. It is not always clear whether your symptoms are caused by allergies or by a headache.  CAUSES    Migraine: The cause of a migraine is not always known.   Sinus Headache: The cause of a sinus headache may be a sinus infection. Other conditions that may be related to sinus headaches include:   Hay fever (allergic rhinitis).   Deviation of the nasal septum.   Swelling or clogging of the nasal passages.  SYMPTOMS   Migraine headache symptoms (which often last 4 to 72 hours) include:   Intense, throbbing pain on one or both sides of the head.   Nausea.   Vomiting.   Being extra sensitive to light.   Being extra sensitive to sound.   Nervous system reactions that appear similar to an allergic reaction:   Stuffy nose.   Runny nose.   Tearing.  Sinus headaches are felt as facial pain or pressure.   DIAGNOSIS   Because there is some overlap in symptoms, sinus and migraine headaches are often misdiagnosed. For example, a person with migraines may also feel facial pressure. Likewise, many people with hay fever may get migraine headaches rather than sinus headaches. These migraines can be triggered by the histamine release during an allergic reaction. An antihistamine medicine can eliminate this pain.  There are standard criteria that help clarify the difference between these headaches and related allergy or allergy-like symptoms. Your caregiver can use these criteria to determine the proper diagnosis and provide you the best care.  TREATMENT   Migraine medicine may help people who have persistent migraine headaches even though their hay fever is controlled. For some people, anti-inflammatory  treatments do not work to relieve migraines. Medicines called triptans (such as sumatriptan) can be helpful for those people.  Document Released: 01/06/2004 Document Revised: 10/05/2011 Document Reviewed: 01/28/2010  [employer] Patient Information 2012 [employer].

## 2012-04-04 NOTE — Progress Notes (Signed)
  Subjective:     Kristen Murray is a 66 y.o. female who presents with ear pain and possible ear infection. Symptoms include: right ear pain, congestion and cough. Onset of symptoms was 3 days ago, and have been gradually worsening since that time. Associated symptoms include: congestion and headache.  Patient denies: achiness, chills, coryza, fever , post nasal drip, sinus pressure, sneezing and sore throat. She is drinking plenty of fluids.  The following portions of the patient's history were reviewed and updated as appropriate: allergies, current medications, past family history, past medical history, past social history, past surgical history and problem list.  Review of Systems Pertinent items are noted in HPI.   Objective:    BP 120/74  Pulse 77  Temp(Src) 98.3 F (36.8 C) (Oral)  Wt 179 lb 3.2 oz (81.285 kg)  SpO2 98%  LMP 07/30/1990 General:  alert, cooperative, appears stated age and no distress  Right Ear: diminished mobility  Left Ear: normal landmarks and mobility  Mouth:  lips, mucosa, and tongue normal; teeth and gums normal  Neck: no adenopathy, no carotid bruit, no JVD, supple, symmetrical, trachea midline and thyroid not enlarged, symmetric, no tenderness/mass/nodules     Assessment:    Right serous otitis and allergies   Plan:    Treatment: No antibiotics indicated at this time. Antihistamine, zyrtec OTC analgesia as needed. Fluids, rest, avoid carbonated/alcoholic and caffeinated beverages.  Follow up in a few days if not improving.

## 2012-04-12 ENCOUNTER — Ambulatory Visit: Payer: Medicare Other | Admitting: Internal Medicine

## 2012-04-17 ENCOUNTER — Encounter: Payer: Self-pay | Admitting: Internal Medicine

## 2012-04-17 ENCOUNTER — Ambulatory Visit (INDEPENDENT_AMBULATORY_CARE_PROVIDER_SITE_OTHER): Payer: Medicare Other | Admitting: Internal Medicine

## 2012-04-17 VITALS — BP 128/80 | HR 86 | Temp 98.1°F | Wt 176.4 lb

## 2012-04-17 DIAGNOSIS — M899 Disorder of bone, unspecified: Secondary | ICD-10-CM

## 2012-04-17 DIAGNOSIS — H6980 Other specified disorders of Eustachian tube, unspecified ear: Secondary | ICD-10-CM

## 2012-04-17 DIAGNOSIS — R252 Cramp and spasm: Secondary | ICD-10-CM

## 2012-04-17 LAB — POTASSIUM: Potassium: 5.1 mEq/L (ref 3.5–5.1)

## 2012-04-17 LAB — MAGNESIUM: Magnesium: 2.3 mg/dL (ref 1.5–2.5)

## 2012-04-17 MED ORDER — FLUTICASONE PROPIONATE 50 MCG/ACT NA SUSP: 1.0000 | Freq: Two times a day (BID) | NASAL | Status: DC | PRN

## 2012-04-17 NOTE — Patient Instructions (Addendum)
Go to [facility] for eustachian tube dysfunction. Drink thin  fluids liberally through the day and chew sugarless gum . Do the Valsalva maneuver several times a day to "pop" ears open. Flonase 1 spray in each nostril twice a day as needed. Use the "crossover" technique as discussed. Use a Neti pot daily- 2x /day  as needed for sinus congestion   Please try to go on My Chart within the next 24 hours to allow me to release the results directly to you.

## 2012-04-17 NOTE — Progress Notes (Signed)
  Subjective:    Patient ID: Kristen Murray, female    DOB: Mar 24, 1946, 66 y.o.   MRN: [id number]  HPI #1 She describes aching in her calves for 7-8 weeks without specific trigger or exacerbating factors. This is intermittent and can last 5-20 minutes. She also notices cramping in her calves in the morning,  from the knees down along the posterior and lateral aspects of the legs. She also has had separation of the fourth and fifth left toes with lateral deviation. She's noted lateral deviation of the right large and second toe intermittently as well. There has been no treatment for this. HCTZ is listed on the med list; she states she has not taken this for several years. #2 Recorded  chief complaint includes dizziness. She denies any significant dizziness. She is having residual intermittent aching  ear discomfort for which she was seen 04/04/12. Zyrtec was recommended that time but it caused excessive sleepiness. There has been no discharge from the ear. She has had intermittent isolated right upper molar discomfort.      Review of Systems She is not having significant frontal sinus pain, facial pain, nasal purulence, or diffuse dental pain. She does use a Neti pot as needed for nasal congestion. She is not having fever, chills, sweats,cough or purulent sputum  The only calcium and vitamin D. supplementation she receives is from her daily multivitamin. She has a history of renal calculi which have been calcium in nature. She also had a calcific deposit removed from her breast.     Objective:   Physical Exam  General appearance:good health ;well nourished; no acute distress or increased work of breathing is present.  No  lymphadenopathy about the head, neck, or axilla noted.   Eyes: No conjunctival inflammation or lid edema is present. Extraocular motion is intact; she has no nystagmus  Ears:  External ear exam shows no significant lesions or deformities.  Otoscopic examination reveals clear canals,  tympanic membranes are intact bilaterally without bulging, retraction, inflammation or discharge.  Nose:  External nasal examination shows no deformity or inflammation. Nasal mucosa are pink and moist without lesions or exudates. No septal dislocation or deviation.No obstruction to airflow.   Oral exam: Dental hygiene is good; lips and gums are healthy appearing.There is no oropharyngeal erythema or exudate noted.   Neck:  No deformities, thyromegaly, masses, or tenderness noted.   Supple with full range of motion but with some discomfort with lateral rotation.   Heart:  Normal rate and regular rhythm. S1 and S2 normal without gallop, murmur, click, rub . S4 with minimal slurring.   Lungs:Chest clear to auscultation; no wheezes, rhonchi,rales ,or rubs present.No increased work of breathing.    Extremities:  No cyanosis, edema, or clubbing  noted . No significant finger changes.  Vascular: All pulses intact without bruits or deficits.  Neuro: Deep tendon reflexes; tone and strength; Romberg testing; and finger to nose testing normal   Skin: Warm & dry w/o jaundice or tenting.          Assessment & Plan:  #1 ear discomfort; eustachian tube dysfunction suggested. No clinical or historical suggestion of rhinosinusitis or otitis media.  #2 muscle cramps with some spastic activity of the toes.  #3 past medical history of nephrolithiasis (calcium stones)  Plan: See orders and recommendations. She should avoid calcium supplementation because of the history renal calculi. Calcium, magnesium, potassium, and vitamin D levels will be checked  Fluticasone intranasally would be recommended

## 2012-04-18 ENCOUNTER — Encounter: Payer: Self-pay | Admitting: Internal Medicine

## 2012-04-29 ENCOUNTER — Telehealth: Payer: Self-pay | Admitting: Internal Medicine

## 2012-04-29 NOTE — Telephone Encounter (Signed)
Discuss with patient, husband and advised to have Pt return call with any further concerns.   All labs are excellent The normal goal for Vitamin D is 40-60. Weight bearing exercises ( @ least 30 minutes of walking @ least 3X/ week), is essential for bone health. Vitamin D is the # 1 cause of muscle pain in women.No change in medications is Indicated.Please perform isometric exercises before going to bed. Sit on side of the bed and raise up on toes to a count of 5. Then put pressure on the heels to a count of 5. Repeat this process 10 times. This will improve blood flow to the calves & help prevent cramps.

## 2012-04-29 NOTE — Telephone Encounter (Signed)
Pt states she has been on MyChart, but cannot see her most recent lab results. She would like someone to go over the results with her. Ok to leave results on answering machine or with her husband.

## 2012-04-29 NOTE — Telephone Encounter (Signed)
Caller: Theodore/Patient; PCP: Marga Melnick; CB#: [phone]; ; ; Call regarding Requesting Test Results and Plan of Care;  Patient requesting results of results of Magnesium, Potassium, Calcium and Vitamin D level done on 04/17/2012. Patient provided with information from prior note documented in Epic that " All labs are excellent, The Normal Goal for Vitamin D is 40-60. Weight bearing exercises, at least 30 minutes of walking at least 3X/week,  is essential for bone health. Vitamin D is the number 1 cause of muscle pain in women, No change in medications is indicated. Please perform isometric exercises before going to bed. Sit on side of the bed and raise up on toes to a count of 5. Then put pressure on the heels to a count of 5. Repeat this process 10 times. This will improve blood flow to the calves and help prevent cramps.  Patient informed of above, verbalizes understanding.    PATIENT STATES SHE IS NOT CURRENTLY ON ANY VITAMIN D SUPPLEMENTS. PATIENT STATES SHE TAKES SHAKLEE MULTIVITAMIN WITH PROBIOTICS. PATIENT DOES NOT HAVE THE BOTTLE WITH HER AT PRESENT TO GIVE SPECIFIC AMOUNTS OF VITAMIN D IN THE MULTIVITAMIN.  PLEASE RETURN CALL TO PATIENT WITH ABOVE ADVICE. Marland Kitchen PATIENT REQUESTS TO BE NOTIFIED @ [phone]. PATIENT GIVES PERMISSION TO RELEASE INFORMATION TO SPOUSE/JOHN OR TO LEAVE VOICEMAIL MESSAGE.  Message sent via Epic EHR.

## 2012-08-02 ENCOUNTER — Ambulatory Visit: Payer: Medicare Other | Admitting: Internal Medicine

## 2012-08-06 ENCOUNTER — Ambulatory Visit (INDEPENDENT_AMBULATORY_CARE_PROVIDER_SITE_OTHER): Payer: Medicare Other | Admitting: Internal Medicine

## 2012-08-06 ENCOUNTER — Encounter: Payer: Self-pay | Admitting: Internal Medicine

## 2012-08-06 VITALS — BP 124/72 | HR 93 | Temp 98.3°F | Wt 183.4 lb

## 2012-08-06 DIAGNOSIS — M2550 Pain in unspecified joint: Secondary | ICD-10-CM

## 2012-08-06 DIAGNOSIS — Z23 Encounter for immunization: Secondary | ICD-10-CM

## 2012-08-06 DIAGNOSIS — K644 Residual hemorrhoidal skin tags: Secondary | ICD-10-CM

## 2012-08-06 MED ORDER — HYDROCORTISONE ACE-PRAMOXINE 1-1 % RE FOAM: 1.0000 | Freq: Two times a day (BID) | RECTAL | Status: DC

## 2012-08-06 NOTE — Addendum Note (Signed)
Addended by: Maurice Small on: 08/06/2012 12:03 PM   Modules accepted: Orders

## 2012-08-06 NOTE — Progress Notes (Signed)
  Subjective:    Patient ID: Kristen Murray, female    DOB: 1946/06/21, 66 y.o.   MRN: [id number]  HPI She experienced some pain with bowel movements last week; after the bowel movement she noted the extrusion of some tissue. There was no associated rectal bleeding.  Significantly she had a colonoscopy by Dr Laural Benes in 2011 which was negative. Recheck due in 2021  She had a hemorrhoidal flare after traveling to [place] last year.  She has been employing Tucks with some benefit.  She denies any unexplained weight loss or melena.    Review of Systems She has intermittent isolated joint pains; this is most notable in the ankles. She has stopped taking Mobic but employs Aleve as needed.     Objective:   Physical Exam General appearance is one of good health and nourishment w/o distress.  Eyes: No conjunctival inflammation or scleral icterus is present.  Oral exam: Dental hygiene is good; lips and gums are healthy appearing.There is no oropharyngeal erythema or exudate noted.  Osteoma of hard palate    Abdomen: bowel sounds normal, soft and non-tender without masses, organomegaly or hernias noted.  No guarding or rebound   Skin:Warm & dry.  Intact without suspicious lesions or rashes   Lymphatic: No lymphadenopathy is noted about the head, neck, axilla areas.   The joints of the hands appear healthy without significant change. Range of motion, strength, and tone are excellent. She does have crepitus of the knees without effusion.   Hemorrhoidal tags without thrombosis or anal fissure             Assessment & Plan:    # 1 hemmorhoids  #2 diffuse arthralgias; historically worsened her ankles. The most striking physical findings are present in the knees as crepitus  Plan: Arthritis strength Tylenol at bedtime is recommended as baseline for arthritis. Hemorrhoidal care discussed

## 2012-08-06 NOTE — Patient Instructions (Addendum)
Soak your buttocks in Sitz bath twice a day and then apply the cream to the hemorrhoidal tissue.  Use an anti-inflammatory cream such as Aspercreme or Zostrix cream twice a day to affected joints as needed. In lieu of this warm moist compresses or  hot water bottle can be used. Do not apply ice!

## 2012-09-05 ENCOUNTER — Encounter: Payer: Self-pay | Admitting: Internal Medicine

## 2012-09-05 ENCOUNTER — Ambulatory Visit (INDEPENDENT_AMBULATORY_CARE_PROVIDER_SITE_OTHER): Payer: Medicare Other | Admitting: Internal Medicine

## 2012-09-05 VITALS — BP 136/78 | HR 86 | Temp 98.3°F | Wt 185.2 lb

## 2012-09-05 DIAGNOSIS — B009 Herpesviral infection, unspecified: Secondary | ICD-10-CM

## 2012-09-05 DIAGNOSIS — M545 Low back pain: Secondary | ICD-10-CM

## 2012-09-05 MED ORDER — CYCLOBENZAPRINE HCL 5 MG PO TABS: ORAL_TABLET | ORAL | Status: DC

## 2012-09-05 MED ORDER — VALACYCLOVIR HCL 500 MG PO TABS: 500.0000 mg | ORAL_TABLET | Freq: Two times a day (BID) | ORAL | Status: DC

## 2012-09-05 MED ORDER — CELECOXIB 200 MG PO CAPS: 200.0000 mg | ORAL_CAPSULE | Freq: Two times a day (BID) | ORAL | Status: DC

## 2012-09-05 NOTE — Progress Notes (Signed)
Subjective:    Patient ID: Kristen Murray, female    DOB: 1946/04/08, 66 y.o.   MRN: [id number]  HPI  #1 Skin lesion: She noted a red spot on the left lateral thigh 6-7 days ago which increased in size and was tender. It was progressive until today when she has noted decrease redness and decreased he. She took Valtrex for this starting 11/5 & 6 . This had been prescribed 04/ 2010 by her gynecologist.   #2 Back  Pain: Location: L LS area Onset:11/3 Trigger/injury:no Pain quality:throbbing Pain severity:up to 9, today 6 Duration:few minutes Radiation: to spine Exacerbating factors:standing up, waist flexion, climbing stairs Treatment/response:Aspercreme  & heating pad with benefit        Review of Systems Constitutional: no fever, chills, sweats, change in weight  Musculoskeletal:no  muscle cramps or pain; no  joint stiffness, redness, or swelling Neuro: no weakness; incontinence (stool/urine); numbness and tingling Heme:no lymphadenopathy; abnormal bruising or bleeding      Objective:   Physical Exam Gen.: well-nourished in appearance. Alert, appropriate and cooperative throughout exam.  Eyes: No corneal or conjunctival inflammation noted.  Mouth: Oral mucosa and oropharynx reveal no lesions or exudates. Teeth in good repair.Osteoma of hard palate Neck: No deformities, masses, or tenderness noted. Range of motion normal. Lungs: Normal respiratory effort; chest expands symmetrically. Lungs are clear to auscultation without rales, wheezes, or increased work of breathing. Heart: Normal rate and rhythm. Normal S1 and S2. No gallop, click, or rub. No murmur. Abdomen: Bowel sounds normal; abdomen soft and nontender. No masses, organomegaly or hernias noted.                                                                 Musculoskeletal/extremities: No deformity or scoliosis noted of  the thoracic or lumbar spine. No clubbing, cyanosis, edema, or deformity noted. Range of motion  normal  .Tone & strength  normal.Joints normal. Nail health  good. She is able to lie flat and sit up without help. Straight leg raising is negative. Some discomfort to percussion over the left lumbosacral area Vascular:  radial artery, dorsalis pedis and  posterior tibial pulses are full and equal.  Neurologic: Alert and oriented x3. Deep tendon reflexes symmetrical and normal. Gait including tiptoe and heel walking is normal         Skin: Faint erythematous lesion left lateral thigh with induration, 8.5 x 10 cm Lymph: No cervical, axillary lymphadenopathy present. Psych: Mood and affect are normal. Normally interactive                                                                                         Assessment & Plan:  #1 herpes simplex, type I skin infection. Valtrex is appropriate. Of concern is that her present prescription is at least a half years old.  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

## 2012-09-05 NOTE — Patient Instructions (Addendum)
The best exercises for the low back include freestyle swimming, stretch aerobics, and yoga. Take the Celebrex samples 1 twice a day as needed for back pain. The muscle relaxant is  to be taken 1-2 at bedtime as needed

## 2012-10-05 DIAGNOSIS — N390 Urinary tract infection, site not specified: Secondary | ICD-10-CM

## 2012-10-05 DIAGNOSIS — B9620 Unspecified Escherichia coli [E. coli] as the cause of diseases classified elsewhere: Secondary | ICD-10-CM

## 2012-10-05 HISTORY — DX: Unspecified Escherichia coli (E. coli) as the cause of diseases classified elsewhere: B96.20

## 2012-10-05 HISTORY — DX: Urinary tract infection, site not specified: N39.0

## 2012-10-10 ENCOUNTER — Encounter: Payer: Self-pay | Admitting: Internal Medicine

## 2012-10-10 ENCOUNTER — Ambulatory Visit (INDEPENDENT_AMBULATORY_CARE_PROVIDER_SITE_OTHER): Payer: Medicare Other | Admitting: Internal Medicine

## 2012-10-10 VITALS — BP 130/78 | HR 90 | Temp 98.3°F | Resp 14 | Wt 184.6 lb

## 2012-10-10 DIAGNOSIS — F419 Anxiety disorder, unspecified: Secondary | ICD-10-CM

## 2012-10-10 DIAGNOSIS — N390 Urinary tract infection, site not specified: Secondary | ICD-10-CM

## 2012-10-10 DIAGNOSIS — R0600 Dyspnea, unspecified: Secondary | ICD-10-CM

## 2012-10-10 DIAGNOSIS — R5381 Other malaise: Secondary | ICD-10-CM

## 2012-10-10 DIAGNOSIS — R0609 Other forms of dyspnea: Secondary | ICD-10-CM

## 2012-10-10 DIAGNOSIS — R079 Chest pain, unspecified: Secondary | ICD-10-CM

## 2012-10-10 DIAGNOSIS — R29898 Other symptoms and signs involving the musculoskeletal system: Secondary | ICD-10-CM

## 2012-10-10 DIAGNOSIS — R5383 Other fatigue: Secondary | ICD-10-CM

## 2012-10-10 DIAGNOSIS — F411 Generalized anxiety disorder: Secondary | ICD-10-CM

## 2012-10-10 LAB — CK TOTAL AND CKMB (NOT AT ARMC)
CK, MB: 0.8 ng/mL (ref 0.3–4.0)
Total CK: 97 U/L (ref 7–177)

## 2012-10-10 LAB — CBC WITH DIFFERENTIAL/PLATELET
Eosinophils Relative: 2.4 % (ref 0.0–5.0)
Monocytes Relative: 8.8 % (ref 3.0–12.0)
Neutrophils Relative %: 55.3 % (ref 43.0–77.0)
Platelets: 229 10*3/uL (ref 150.0–400.0)
WBC: 7.2 10*3/uL (ref 4.5–10.5)

## 2012-10-10 LAB — BASIC METABOLIC PANEL
BUN: 14 mg/dL (ref 6–23)
Creatinine, Ser: 0.7 mg/dL (ref 0.4–1.2)
GFR: 116.77 mL/min (ref >60.00)

## 2012-10-10 LAB — T4, FREE: Free T4: 0.74 ng/dL (ref 0.60–1.60)

## 2012-10-10 LAB — HEPATIC FUNCTION PANEL
ALT: 18 U/L (ref 0–35)
AST: 20 U/L (ref 0–37)
Total Bilirubin: 0.4 mg/dL (ref 0.3–1.2)

## 2012-10-10 LAB — TSH: TSH: 1.36 u[IU]/mL (ref 0.35–5.50)

## 2012-10-10 MED ORDER — SERTRALINE HCL 25 MG PO TABS: 25.0000 mg | ORAL_TABLET | Freq: Every day | ORAL | Status: DC

## 2012-10-10 NOTE — Patient Instructions (Addendum)
Please review the medication list in the After Visit Summary provided.Please write the name of the prescribing physician to the right of the medication and share this with all medical staff seen at each appointment. This will help provide continuity of care; help optimize therapeutic interventions;and help prevent drug:drug adverse reaction.  Please obtain final  sensitivity results for  the urine culture Order for x-rays entered into  the computer; these will be performed at [address] [facility]. across from [facility]. No appointment is necessary.

## 2012-10-10 NOTE — Progress Notes (Signed)
Subjective:    Patient ID: Kristen Murray, female    DOB: [birth year], 66 y.o.   MRN: [id number]  HPI In October she awoke from sleep with a "jolt";since that time she's felt nervous. She also describes intermittent racing of her heart and  Non exertional aching in her chest/back. She's had some exertional dyspnea. She also describes weakness in her knees. She denies a constellation of headache, flushing, chest pain, and diarrhea. She was seen at [facility]  urgent care at 10/05/12 and placed on nitrofurantoin pending results of the urine culture. This did show Escherichia coli; she was told to complete the entire  course of nitrofurantoin. She is seeing Dr. Logan Bores for chronic, recurrent urinary tract infections @ [facility]. Apparently he has her on low-dose suppressive antibiotic, Trimethaprim.                                                                                                  Review of Systems Fever/ chills : no  Night sweats: occasionally around neck                                                                                           Vision changes ( blurred/ double/ loss): no                                                                                                Hoarseness or swallowing dysfunction: no                                                                                         Bowel changes( constipation/ diarrhea): some constipation  Weight change: up 6 # since 6/13  Cough: non productive cough only in am for 20-30 min  Hemoptysis: no Leg swelling: no  PND: no  Melena/ rectal bleeding: no Adenopathy: no Severe snoring: no Daytime sleepiness: no Skin / hair / nail changes: no Temperature intolerance( heat/ cold) :heat intolerance       Feeling depressed:denied Anhedonia: as of 2 weeks ago , ? Due to UTI Apnea :no Abnormal bruising / bleeding or enlarged lymph nodes: no                                                                                 Objective:   Physical Exam Gen.: Healthy and well-nourished in appearance. Alert, appropriate and cooperative throughout exam.  Eyes: No corneal or conjunctival inflammation noted. No icterus. Extraocular motion intact. Vision grossly normal.  Nose: External nasal exam reveals no deformity or inflammation. Nasal mucosa are pink and moist. No lesions or exudates noted.   Mouth: Oral mucosa and oropharynx reveal no lesions or exudates. Teeth in good repair. Neck: No deformities, masses, or tenderness noted.  Thyroid normal. Lungs: Normal respiratory effort; chest expands symmetrically. Lungs are clear to auscultation without rales, wheezes, or increased work of breathing. Heart: Normal rate and rhythm. Normal S1 and S2. No gallop, click, or rub. Grade 1/6 systolic murmur R base. Abdomen: Bowel sounds normal; abdomen soft and nontender. No masses, organomegaly or hernias noted.                                                                                  Musculoskeletal/extremities:  No clubbing, cyanosis, edema, or deformity noted. Range of motion  normal .Tone & strength  normal.Joints normal. Nail health  Good.Minor knee crepitus Vascular: Carotid, radial artery, dorsalis pedis and  posterior tibial pulses are full and equal. No bruits present. Neurologic: Alert and oriented x3. Deep tendon reflexes symmetrical and normal.          Skin: Intact without suspicious lesions or rashes. Lymph: No cervical, axillary lymphadenopathy present. Psych: Mood and affect are normal. Normally interactive                                                                                         Assessment & Plan:  #1 atypical chest pain  #2 dyspnea #3 fatigue  #4 subjective leg weakness  #5 anxiety  #6 apparent Escherichia coli urinary tract infection being actively treated. Past history of recurrent urinary tract infections  Plan: See  orders and recommendations

## 2012-10-16 ENCOUNTER — Encounter: Payer: Self-pay | Admitting: Gynecology

## 2012-10-16 ENCOUNTER — Ambulatory Visit (INDEPENDENT_AMBULATORY_CARE_PROVIDER_SITE_OTHER): Payer: Medicare Other | Admitting: Gynecology

## 2012-10-16 VITALS — BP 140/78 | Ht 64.5 in | Wt 184.0 lb

## 2012-10-16 DIAGNOSIS — Z780 Asymptomatic menopausal state: Secondary | ICD-10-CM

## 2012-10-16 DIAGNOSIS — IMO0001 Reserved for inherently not codable concepts without codable children: Secondary | ICD-10-CM

## 2012-10-16 DIAGNOSIS — R350 Frequency of micturition: Secondary | ICD-10-CM

## 2012-10-16 DIAGNOSIS — N952 Postmenopausal atrophic vaginitis: Secondary | ICD-10-CM | POA: Insufficient documentation

## 2012-10-16 DIAGNOSIS — N898 Other specified noninflammatory disorders of vagina: Secondary | ICD-10-CM

## 2012-10-16 MED ORDER — NONFORMULARY OR COMPOUNDED ITEM: Status: DC

## 2012-10-16 NOTE — Patient Instructions (Signed)
Shingles Vaccine What You Need to Know WHAT IS SHINGLES?  Shingles is a painful skin rash, often with blisters. It is also called Herpes Zoster or just Zoster.  A shingles rash usually appears on one side of the face or body and lasts from 2 to 4 weeks. Its main symptom is pain, which can be quite severe. Other symptoms of shingles can include fever, headache, chills, and upset stomach. Very rarely, a shingles infection can lead to pneumonia, hearing problems, blindness, brain inflammation (encephalitis), or death.  For about 1 person in 5, severe pain can continue even after the rash clears up. This is called post-herpetic neuralgia.  Shingles is caused by the Varicella Zoster virus. This is the same virus that causes chickenpox. Only someone who has had a case of chickenpox or rarely, has gotten chickenpox vaccine, can get shingles. The virus stays in your body. It can reappear many years later to cause a case of shingles.  You cannot catch shingles from another person with shingles. However, a person who has never had chickenpox (or chickenpox vaccine) could get chickenpox from someone with shingles. This is not very common.  Shingles is far more common in people 50 and older than in younger people. It is also more common in people whose immune systems are weakened because of a disease such as cancer or drugs such as steroids or chemotherapy.  At least 1 million people get shingles per year in the [place]. SHINGLES VACCINE  A vaccine for shingles was licensed in 2006. In clinical trials, the vaccine reduced the risk of shingles by 50%. It can also reduce the pain in people who still get shingles after being vaccinated.  A single dose of shingles vaccine is recommended for adults 60 years of age and older. SOME PEOPLE SHOULD NOT GET SHINGLES VACCINE OR SHOULD WAIT A person should not get shingles vaccine if he or she:  Has ever had a life-threatening allergic reaction to gelatin, the  antibiotic neomycin, or any other component of shingles vaccine. Tell your caregiver if you have any severe allergies.  Has a weakened immune system because of current:  AIDS or another disease that affects the immune system.  Treatment with drugs that affect the immune system, such as prolonged use of high-dose steroids.  Cancer treatment, such as radiation or chemotherapy.  Cancer affecting the bone marrow or lymphatic system, such as leukemia or lymphoma.  Is pregnant, or might be pregnant. Women should not become pregnant until at least 4 weeks after getting shingles vaccine. Someone with a minor illness, such as a cold, may be vaccinated. Anyone with a moderate or severe acute illness should usually wait until he or she recovers before getting the vaccine. This includes anyone with a temperature of 101.3 F (38 C) or higher. WHAT ARE THE RISKS FROM SHINGLES VACCINE?  A vaccine, like any medicine, could possibly cause serious problems, such as severe allergic reactions. However, the risk of a vaccine causing serious harm, or death, is extremely small.  No serious problems have been identified with shingles vaccine. Mild Problems  Redness, soreness, swelling, or itching at the site of the injection (about 1 person in 3).  Headache (about 1 person in 70). Like all vaccines, shingles vaccine is being closely monitored for unusual or severe problems. WHAT IF THERE IS A MODERATE OR SEVERE REACTION? What should I look for? Any unusual condition, such as a severe allergic reaction or a high fever. If a severe allergic reaction   occurred, it would be within a few minutes to an hour after the shot. Signs of a serious allergic reaction can include difficulty breathing, weakness, hoarseness or wheezing, a fast heartbeat, hives, dizziness, paleness, or swelling of the throat. What should I do?  Call your caregiver, or get the person to a caregiver right away.  Tell the caregiver what  happened, the date and time it happened, and when the vaccination was given.  Ask the caregiver to report the reaction by filing a Vaccine Adverse Event Reporting System (VAERS) form. Or, you can file this report through the VAERS web site at www.vaers.hhs.gov or by calling 1-800-822-7967. VAERS does not provide medical advice. HOW CAN I LEARN MORE?  Ask your caregiver. He or she can give you the vaccine package insert or suggest other sources of information.  Contact the Centers for Disease Control and Prevention (CDC):  Call 1-800-232-4636 (1-800-CDC-INFO).  Visit the CDC website at www.cdc.gov/vaccines CDC Shingles Vaccine VIS (08/04/08) Document Released: 08/13/2006 Document Revised: 01/08/2012 Document Reviewed: 08/04/2008 Exit[employer] Patient Information 2013 [employer].  

## 2012-10-16 NOTE — Progress Notes (Signed)
Kristen Murray 1946/10/14 [id number]   History:    66 y.o.  for annual gyn exam with complaint of vaginal dryness. Patient is postmenopausal on no hormone replacement therapy. She was having some mild frequency. She states that she was treated recently for urinary tract infection. Patient with past history of total abdominal hysterectomy with bilateral salpingo-oophorectomy. Her last mammogram December 2012. It is also been several years since her bone density study. She in frequently does her self breast examination. Her last colonoscopy was reported to be in 2011 which was normal. Several years ago she had had benign polyps. She is on a 10 year schedule according to the patient. She has her flu vaccine this year but has not had her shingles vaccine as of yet.  Past medical history,surgical history, family history and social history were all reviewed and documented in the EPIC chart.  Gynecologic History Patient's last menstrual period was 07/30/1990. Contraception: status post hysterectomy Last Pap: 2012. Results were: normal Last mammogram: 2012. Results were: normal  Obstetric History OB History    Grav Para Term Preterm Abortions TAB SAB Ect Mult Living   3 3 3       3      # Outc Date GA Lbr Len/2nd Wgt Sex Del Anes PTL Lv   1 TRM     F SVD  No Yes   2 TRM     F SVD  No Yes   3 TRM     F SVD  No Yes       ROS: A ROS was performed and pertinent positives and negatives are included in the history.  GENERAL: No fevers or chills. HEENT: No change in vision, no earache, sore throat or sinus congestion. NECK: No pain or stiffness. CARDIOVASCULAR: No chest pain or pressure. No palpitations. PULMONARY: No shortness of breath, cough or wheeze. GASTROINTESTINAL: No abdominal pain, nausea, vomiting or diarrhea, melena or bright red blood per rectum. GENITOURINARY: No urinary frequency, urgency, hesitancy or dysuria. MUSCULOSKELETAL: No joint or muscle pain, no back pain, no recent trauma.  DERMATOLOGIC: No rash, no itching, no lesions. ENDOCRINE: No polyuria, polydipsia, no heat or cold intolerance. No recent change in weight. HEMATOLOGICAL: No anemia or easy bruising or bleeding. NEUROLOGIC: No headache, seizures, numbness, tingling or weakness. PSYCHIATRIC: No depression, no loss of interest in normal activity or change in sleep pattern.     Exam: chaperone present  BP 140/78  Ht 5' 4.5" (1.638 m)  Wt 184 lb (83.462 kg)  BMI 31.10 kg/m2  LMP 07/30/1990  Body mass index is 31.10 kg/(m^2).  General appearance : Well developed well nourished female. No acute distress HEENT: Neck supple, trachea midline, no carotid bruits, no thyroidmegaly Lungs: Clear to auscultation, no rhonchi or wheezes, or rib retractions  Heart: Regular rate and rhythm, no murmurs or gallops Breast:Examined in sitting and supine position were symmetrical in appearance, no palpable masses or tenderness,  no skin retraction, no nipple inversion, no nipple discharge, no skin discoloration, no axillary or supraclavicular lymphadenopathy Abdomen: no palpable masses or tenderness, no rebound or guarding Extremities: no edema or skin discoloration or tenderness  Pelvic:  Bartholin, Urethra, Skene Glands: Within normal limits             Vagina: No gross lesions or discharge, atrophy  Cervix: Absent  Uterus  absent              Adnexa  Without masses or tenderness  Anus and perineum  normal  Rectovaginal  normal sphincter tone without palpated             masses or tenderness             Hemoccult cards provided     Assessment/Plan:  66 y.o. female for annual exam with vaginal atrophy. Patient is not having any vasomotor symptoms otherwise. We're going to place her on estradiol 0.02% 1 mL prefilled applicator to apply intravaginally twice a week. Risks benefits and pros and cons of topical estrogen discussed. Hemoccult cards provided for the patient to submit to the office for testing. Patient will no  longer needs Pap smears based on new guidelines: Patient would know prior history of abnormal Pap smears, greater than 46 years of age, and prior hysterectomy. She was encouraged to do her monthly self breast examination and to followup with her mammogram next month and she will also need a bone density study as well. A prescription was provided for her to obtain her herpes zoster vaccine. She was reminded to submit to the office Hemoccult cards for testing. A UA will be tested today.    Ok Edwards MD, 5:07 PM 10/16/2012

## 2012-10-17 LAB — URINALYSIS W MICROSCOPIC + REFLEX CULTURE
Bacteria, UA: NONE SEEN
Glucose, UA: NEGATIVE mg/dL
Hgb urine dipstick: NEGATIVE
Leukocytes, UA: NEGATIVE
Protein, ur: NEGATIVE mg/dL

## 2012-10-18 ENCOUNTER — Telehealth: Payer: Self-pay

## 2012-10-18 NOTE — Telephone Encounter (Signed)
Hopp please advise if you received paperwork from [facility]

## 2012-10-18 NOTE — Telephone Encounter (Signed)
Message copied by Maurice Small on Fri Oct 18, 2012  9:08 AM ------      Message from: [facility], [place] R      Created: Fri Oct 18, 2012  8:58 AM      Contact: Illyanna       Pt called stated she dropped off a note for Hopp to review as she went to [facility] for UTI and pt wanted to know if medications given were ok for dx.      CB# [phone]

## 2012-10-20 ENCOUNTER — Encounter: Payer: Self-pay | Admitting: Internal Medicine

## 2012-10-25 ENCOUNTER — Ambulatory Visit: Payer: Medicare Other | Admitting: Internal Medicine

## 2012-10-29 NOTE — Telephone Encounter (Signed)
I reviewed & entered data into her EMR

## 2012-11-05 ENCOUNTER — Encounter: Payer: Self-pay | Admitting: Gynecology

## 2012-12-05 ENCOUNTER — Encounter: Payer: Self-pay | Admitting: Internal Medicine

## 2012-12-05 ENCOUNTER — Ambulatory Visit (INDEPENDENT_AMBULATORY_CARE_PROVIDER_SITE_OTHER): Payer: Medicare Other | Admitting: Internal Medicine

## 2012-12-05 VITALS — BP 124/78 | HR 91 | Temp 98.1°F | Wt 183.0 lb

## 2012-12-05 DIAGNOSIS — R109 Unspecified abdominal pain: Secondary | ICD-10-CM

## 2012-12-05 DIAGNOSIS — R102 Pelvic and perineal pain: Secondary | ICD-10-CM

## 2012-12-05 DIAGNOSIS — M545 Low back pain, unspecified: Secondary | ICD-10-CM

## 2012-12-05 NOTE — Progress Notes (Signed)
Subjective:    Patient ID: Kristen Murray, female    DOB: Jan 05, 1946, 67 y.o.   MRN: [id number]  HPI #1 BACK PAIN: Back pain began 4-5 weeks ago in the lower thoracic spine area w/o repetitive motion, injury, lifting, overuse or hyperextension. The pain is described as light ache & soreness and non radiating. The pain is worse  sitting in car or with hyperextension. Pain has not improved with hot water in shower, Biofreeze, Aspercreme, & nonsteroidals . No associated  numbness and tingling in the  legs or limb weakness.  #2 ABDOMINAL PAIN: Abdominal pain began 12/13 in  Suprapubic area ; it was described as  sharp and non radiating . Severity was up to a level 8  ; the discomfort has lasted seconds . It was not impacted by change in position,urination ,or bowel function .No meds to date. There were no definite exacerbating factors Nausea vomiting,  diarrhea, melena, or rectal bleeding were not described. She has chronic constipation. There was no associated dyspepsia, dysphagia, anorexia, or hematemesis. No significant weight change noted.   Past medical history of colon polyps. Family history are negative for significant gastrointestinal diseases such as ulcer, reflux,  colitis,  colon polyps, or colon cancer.                                                                                               Review of Systems Negative or absent signs and symptoms include: Constitutional: No fever, chills, sweats, unexplained weight loss Cardiopulmonary: No chest pain; palpitations; dyspnea; edema;cough ; sputum production ;hemoptysis GI: No abdominal pain;  melena; rectal bleeding GU: No hematuria, pyuria, or dysuria There was no associated rash or radicular pain in the area of the discomfort. MS: No joint stiffness;redness; swelling Heme/Lymph:No abnormal bruising or bleeding    Objective:   Physical Exam Gen.: Healthy and well-nourished in appearance. Alert, appropriate and  cooperative throughout exam. Appears younger than stated age  Eyes: No corneal or conjunctival inflammation noted. No icterus Mouth: osteoma hard palate Neck: No deformities, masses, or tenderness noted. ROM WNL. Lungs: Normal respiratory effort; chest expands symmetrically. Lungs are clear to auscultation without rales, wheezes, or increased work of breathing. Heart: Normal rate and rhythm. Normal S1 and S2. No gallop, click, or rub. Grade 1/2 -1 over6 systolic murmur  Abdomen: Bowel sounds normal; abdomen soft and nontender. No masses, organomegaly or hernias noted.                                  Musculoskeletal/extremities: No deformity or scoliosis noted of  the thoracic or lumbar spine. No clubbing, cyanosis, edema, or significant extremity  deformity noted. Range of motion normal .Tone & strength  normal.Joints normal . Nail health good. Able to lie down & sit up w/o help. Negative SLR bilaterally Vascular: Carotid, radial artery, dorsalis pedis and  posterior tibial pulses are full and equal. No bruits present. Neurologic: Alert and oriented x3. Deep tendon reflexes symmetrical and normal.Gait including heel & toe walking normal.  Skin: Intact without suspicious lesions or rashes. Lymph: No cervical, axillary lymphadenopathy present. Psych: Mood and affect are normal. Normally interactive         Assessment & Plan:  #1 low back musculoskeletal pain; no neuromuscular deficit present  #2 suprapubic abdominal discomfort  #3 constipation  #4 history of colon polyps; colonoscopy current  Plan: See orders and recommendations

## 2012-12-05 NOTE — Patient Instructions (Addendum)
The best exercises for the low back include freestyle swimming, stretch aerobics, and yoga. Cybex & Nautilus  machines with low weights are another option. If symptoms persist or progress; chiropractory or physical therapy would be indicated. Miralax every 3 rd day as needed for constipation.

## 2012-12-06 LAB — POCT URINALYSIS DIPSTICK
Bilirubin, UA: NEGATIVE
Glucose, UA: NEGATIVE
Nitrite, UA: NEGATIVE
Urobilinogen, UA: 0.2

## 2012-12-06 LAB — CBC WITH DIFFERENTIAL/PLATELET
Basophils Relative: 0.3 % (ref 0.0–3.0)
Eosinophils Relative: 2.5 % (ref 0.0–5.0)
Lymphocytes Relative: 35.1 % (ref 12.0–46.0)
MCV: 87.8 fl (ref 78.0–100.0)
Monocytes Relative: 7.8 % (ref 3.0–12.0)
Neutrophils Relative %: 54.3 % (ref 43.0–77.0)
RBC: 4.63 Mil/uL (ref 3.87–5.11)
WBC: 9.7 10*3/uL (ref 4.5–10.5)

## 2012-12-06 LAB — LIPASE: Lipase: 16 U/L (ref 11.0–59.0)

## 2013-01-21 ENCOUNTER — Encounter: Payer: Self-pay | Admitting: Lab

## 2013-01-21 ENCOUNTER — Other Ambulatory Visit (INDEPENDENT_AMBULATORY_CARE_PROVIDER_SITE_OTHER): Payer: Medicare Other

## 2013-01-21 DIAGNOSIS — Z1289 Encounter for screening for malignant neoplasm of other sites: Secondary | ICD-10-CM

## 2013-01-21 DIAGNOSIS — Z0000 Encounter for general adult medical examination without abnormal findings: Secondary | ICD-10-CM

## 2013-01-21 LAB — HEMOCCULT GUIAC POC 1CARD (OFFICE)
Card #3 Fecal Occult Blood, POC: NEGATIVE
Fecal Occult Blood, POC: NEGATIVE

## 2013-04-01 ENCOUNTER — Encounter: Payer: Self-pay | Admitting: Internal Medicine

## 2013-04-01 ENCOUNTER — Ambulatory Visit (INDEPENDENT_AMBULATORY_CARE_PROVIDER_SITE_OTHER): Payer: Medicare Other | Admitting: Internal Medicine

## 2013-04-01 VITALS — BP 136/78 | HR 91 | Wt 182.0 lb

## 2013-04-01 DIAGNOSIS — R05 Cough: Secondary | ICD-10-CM

## 2013-04-01 DIAGNOSIS — H1013 Acute atopic conjunctivitis, bilateral: Secondary | ICD-10-CM

## 2013-04-01 DIAGNOSIS — H1010 Acute atopic conjunctivitis, unspecified eye: Secondary | ICD-10-CM

## 2013-04-01 MED ORDER — PREDNISONE 20 MG PO TABS: 20.0000 mg | ORAL_TABLET | Freq: Two times a day (BID) | ORAL | Status: DC

## 2013-04-01 NOTE — Patient Instructions (Addendum)

## 2013-04-01 NOTE — Progress Notes (Signed)
  Subjective:    Patient ID: Kristen Murray, female    DOB: December 09, 1945, 67 y.o.   MRN: [id number]  HPI  Symptoms began 03/15/13 while she was in [place] as excessive mucous production associated with rhinitis and pressure in her ears. Thiswas actually associated with decreased auditory acuity 5/19 and 5/20.  She denies any specific exposures to sick individuals or defined allergens.  Her symptoms progressed and by 5/26 she had sneezing with itchy , watery eyes and nonproductive cough. This was associated with headache and feeling warm. She denied specifically having defined fever or chills.  She continued to have some sore chest and constant rhinitis. This has impacted rest resulting in fatigue.  She saw her ear nose and throat specialist 5/20 was prescribed cefuroxime 250 mg twice a day for 10 days, Delsym, and over-the-counter expectorant/decongestant combination. She questions response to these interventions.     Review of Systems  At this time she has occasional shortness of breath. She specifically denies frontal headache, facial pain, nasal purulence, sore throat, dental pain, persistent pelvic pain or discharge. She is not having wheezing or extrinsic symptoms such as itchy watery eyes @ this time.     Objective:   Physical Exam General appearance:good health ;well nourished; no acute distress or increased work of breathing is present.  No  lymphadenopathy about the head, neck, or axilla noted.   Eyes: No conjunctival inflammation or lid edema is present.   Ears:  External ear exam shows no significant lesions or deformities.  Otoscopic examination reveals clear canals, tympanic membranes are intact bilaterally without bulging, retraction, inflammation or discharge.  Nose:  External nasal examination shows no deformity or inflammation. Nasal mucosa are pink and moist without lesions or exudates. No septal dislocation or deviation.No obstruction to airflow.   Oral exam: Dental  hygiene is good; lips and gums are healthy appearing.There is no oropharyngeal erythema or exudate noted.   Neck:  No deformities, masses, or tenderness noted.    Heart:  Normal rate and regular rhythm. S1 and S2 normal without gallop, murmur, click, rub or other extra sounds. S4  Lungs:Chest clear to auscultation; no wheezes, rhonchi,rales ,or rubs present.No increased work of breathing.    Extremities:  No cyanosis, edema, or clubbing  noted    Skin: Warm & dry          Assessment & Plan:  #1 cough in the context of rhinoconjunctivitis; no clinical evidence of rhinosinusitis  Plan: see recommendations

## 2013-04-22 ENCOUNTER — Telehealth: Payer: Self-pay | Admitting: *Deleted

## 2013-04-22 NOTE — Telephone Encounter (Signed)
OK 25 mg 1/2 -1 tid prn #15

## 2013-04-22 NOTE — Telephone Encounter (Signed)
Pt walked in to office with expired bottle of meclizine frim 10-19-12 and would like to know if it is ok to use this med. Pt states that she awoke this am with a little dizziness which she normally has time to time. Pt is leaving to go to the [place] and would like to know if Hopp will refill Rx for her to have on hand in case of a episode..Please advise   Pt uses [facility] [address]

## 2013-04-23 MED ORDER — MECLIZINE HCL 25 MG PO TABS: ORAL_TABLET | ORAL | Status: DC

## 2013-04-23 NOTE — Telephone Encounter (Signed)
Patient called regarding status or refill of Meclizine.  Discovered Rx was orgionally ordered by ENT; called back to [facility] if she should call ENT instead.    Per Epic note from 04/22/13, informed Dr Alwyn Ren has approved a refill for Meclizine 25 mg #15; sig: 1/2-1 tab po TID prn however it has not yet been sent to pharmacy.  Requests Rx be sent to [facility] at [address].  Please call Kristen Murray at [phone] to notify when Rx has been sent to pharmacy.

## 2013-04-23 NOTE — Telephone Encounter (Signed)
Left msg making pt aware rx has been sent to pharmacy.  Refill done.

## 2013-05-20 ENCOUNTER — Encounter: Payer: Self-pay | Admitting: Internal Medicine

## 2013-05-20 ENCOUNTER — Ambulatory Visit (INDEPENDENT_AMBULATORY_CARE_PROVIDER_SITE_OTHER): Payer: Medicare Other | Admitting: Internal Medicine

## 2013-05-20 VITALS — BP 122/74 | HR 86 | Temp 98.2°F | Wt 179.0 lb

## 2013-05-20 DIAGNOSIS — R21 Rash and other nonspecific skin eruption: Secondary | ICD-10-CM

## 2013-05-20 NOTE — Patient Instructions (Addendum)
Use Eucerin or Aveeno Daily  Moisturizing Lotion  twice a day  for the dry skin. Bathe with moisturizing liquid soap , not bar soap.If itching add Cort Aid mixed 1:1.

## 2013-05-20 NOTE — Progress Notes (Signed)
  Subjective:    Patient ID: Kristen Murray, female    DOB: 02/08/1946, 67 y.o.   MRN: [id number]  HPI  The rash began as red dots ,which were associated with stinging and slight itching over the shins despite long pants. This coalesced over the next 24 hours into larger red spots. She applied Aspercreme & hydrocortisone cream  without dramatic response. It has subsided somewhat & not progressed. She had been on anti malarial pills.  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     Review of Systems she denied associated fever, chills, or sweats. She had no abdominal pain or diarrhea with the rash.  She denies itchy, watery eyes or sneezing. She's had no associated angioedema. There's been no wheezing or shortness of breath.     Objective:   Physical Exam  Gen.: Healthy and well-nourished in appearance. Alert, appropriate and cooperative throughout exam.  Eyes: No corneal or conjunctival inflammation noted. No icterus Mouth: Oral mucosa and oropharynx reveal no lesions or exudates. Teeth in good repair. Neck: No deformities, masses, or tenderness noted.  Lungs: Normal respiratory effort; chest expands symmetrically. Lungs are clear to auscultation without rales, wheezes, or increased work of breathing. Heart: Normal rate and rhythm. Normal S1 and S2. No gallop, click, or rub. No murmur. Abdomen: Bowel sounds normal; abdomen soft and nontender. No masses, organomegaly or hernias noted.                         Musculoskeletal/extremities: No clubbing, cyanosis, edema, or significant extremity  deformity noted. Joints normal. Nail health good. Neurologic: Alert and oriented x3.  Skin: Irregular slightly papular erythematous changes in the shins bilaterally. These lesions do not blanch with pressure. There is no significant dermatographia elicitable Lymph: No cervical, axillary lymphadenopathy present. Psych: Mood and affect are normal. Normally  interactive                                                                                        Assessment & Plan:  #1 rash ; resolving. No evidence of progressive issues Plan: Elocon if resolution delayed

## 2013-06-04 ENCOUNTER — Other Ambulatory Visit: Payer: Self-pay

## 2013-07-09 ENCOUNTER — Ambulatory Visit (INDEPENDENT_AMBULATORY_CARE_PROVIDER_SITE_OTHER): Payer: Medicare Other | Admitting: Internal Medicine

## 2013-07-09 ENCOUNTER — Encounter: Payer: Self-pay | Admitting: Internal Medicine

## 2013-07-09 VITALS — BP 155/73 | HR 86 | Temp 97.8°F | Resp 12 | Wt 179.0 lb

## 2013-07-09 DIAGNOSIS — R1013 Epigastric pain: Secondary | ICD-10-CM

## 2013-07-09 DIAGNOSIS — E119 Type 2 diabetes mellitus without complications: Secondary | ICD-10-CM

## 2013-07-09 DIAGNOSIS — K219 Gastro-esophageal reflux disease without esophagitis: Secondary | ICD-10-CM

## 2013-07-09 LAB — CBC WITH DIFFERENTIAL/PLATELET
Basophils Relative: 0.5 % (ref 0.0–3.0)
Eosinophils Absolute: 0.2 10*3/uL (ref 0.0–0.7)
Hemoglobin: 13.8 g/dL (ref 12.0–15.0)
Lymphocytes Relative: 38.7 % (ref 12.0–46.0)
MCHC: 34.1 g/dL (ref 30.0–36.0)
MCV: 87.3 fl (ref 78.0–100.0)
Monocytes Absolute: 0.5 10*3/uL (ref 0.1–1.0)
Neutro Abs: 3.4 10*3/uL (ref 1.4–7.7)
RBC: 4.62 Mil/uL (ref 3.87–5.11)

## 2013-07-09 MED ORDER — FREESTYLE LANCETS MISC: Status: DC

## 2013-07-09 MED ORDER — RANITIDINE HCL 150 MG PO TABS: 150.0000 mg | ORAL_TABLET | Freq: Two times a day (BID) | ORAL | Status: DC

## 2013-07-09 NOTE — Patient Instructions (Addendum)
Reflux of gastric acid may be asymptomatic as this may occur mainly during sleep.The triggers for reflux  include stress; the "aspirin family" ; alcohol; peppermint; and caffeine (coffee, tea, cola, and chocolate). The aspirin family would include aspirin and the nonsteroidal agents such as ibuprofen &  Naproxen. Tylenol would not cause reflux. If having symptoms ; food & drink should be avoided for @ least 2 hours before going to bed.    Minimal Blood Pressure Goal= AVERAGE < 140/90;  Ideal is an AVERAGE < 135/85. This AVERAGE should be calculated from @ least 5-7 BP readings taken @ different times of day on different days of week. You should not respond to isolated BP readings , but rather the AVERAGE for that week .Please bring your  blood pressure cuff to office visits to verify that it is reliable.It  can also be checked against the blood pressure device at the pharmacy. Finger or wrist cuffs are not dependable; an arm cuff is. 

## 2013-07-09 NOTE — Progress Notes (Signed)
  Subjective:    Patient ID: Kristen Murray, female    DOB: [birth year], 67 y.o.   MRN: [id number]  HPI  She has had chronic reflux symptoms for years; she has had exacerbation of symptoms since April or May of this year w/o trigger. The symptoms are described as dyspepsia with epigastric discomfort described as cramping or aching up to level III. This can last up to 45 minutes &  into radiate into the chest.  She has found that Sprite and Zantac enable her to burp and relieve some of the symptoms. Within the last month she's made significant dietary changes at the recommendation of urologist. He has her on a complex dietary program to help prevent any interstitial cystitis process.  In 2011 endoscopy was negative. Remotely she's had a evaluation with intestinal camera which was negative as well. In 2011 her gastroenterologist prescribed omeprazole.  Additionally her colonoscopy in 2011 was negative; she did have colon polyps in 2006  She's had a total abdominal hysterectomy and bilateral salpingo-oophorectomy for fibroids.  She is unaware of any family history of ulcers, reflux, colitis, colon polyps, colon cancer, gallbladder disease, or nephrolithiasis. She has had a history of kidney stones   Review of Systems  She has had some constipation without diarrhea. She also denies melena or rectal bleeding. She has no dysphagia although the food sometimes feels as if it's coming back. There's been no anorexia, hematemesis, weight loss, fever, chills, or sweats. She has no associated dysuria, pyuria, or hematuria. She is not had any rash in the area of discomfort. She denies radicular pain starting back and coming forward. She'll have a nonsteroidal-type product 1 or 2 times per week. She uses only decaf coffee. She'll have a glass of tea mix with water/5 times per week. She does not drink and has never smoked. She does not ingest peppermint. She denies exertional chest pain, palpitations, or  dyspnea.  FBS average 103; she denies polyuria, polydipsia, or polyphagia.    Objective:   Physical Exam General appearance is one of good health and nourishment w/o distress.  Eyes: No conjunctival inflammation or scleral icterus is present.  Oral exam: Dental hygiene is good; lips and gums are healthy appearing.There is no oropharyngeal erythema or exudate noted.   Heart:  Normal rate and regular rhythm. S1 and S2 normal without gallop, murmur, click, rub or other extra sounds . S4   Lungs:Chest clear to auscultation; no wheezes, rhonchi,rales ,or rubs present.No increased work of breathing.   Abdomen: bowel sounds normal, soft and non-tender without masses, organomegaly or hernias noted.  No guarding or rebound   Skin:Warm & dry.  Intact without suspicious lesions or rashes ; no jaundice or tenting  Lymphatic: No lymphadenopathy is noted about the head, neck, axilla.             Assessment & Plan:  See Current Assessment & Plan in Problem List under specific Diagnosis

## 2013-07-10 ENCOUNTER — Encounter: Payer: Self-pay | Admitting: Internal Medicine

## 2013-07-23 ENCOUNTER — Telehealth: Payer: Self-pay | Admitting: *Deleted

## 2013-07-25 NOTE — Telephone Encounter (Signed)
Error

## 2013-08-06 ENCOUNTER — Ambulatory Visit (INDEPENDENT_AMBULATORY_CARE_PROVIDER_SITE_OTHER): Payer: Medicare Other

## 2013-08-06 DIAGNOSIS — Z23 Encounter for immunization: Secondary | ICD-10-CM

## 2013-08-15 ENCOUNTER — Ambulatory Visit (INDEPENDENT_AMBULATORY_CARE_PROVIDER_SITE_OTHER): Payer: Medicare Other | Admitting: Family Medicine

## 2013-08-15 ENCOUNTER — Encounter: Payer: Self-pay | Admitting: Family Medicine

## 2013-08-15 ENCOUNTER — Encounter: Payer: Self-pay | Admitting: General Practice

## 2013-08-15 VITALS — BP 140/90 | HR 91 | Temp 98.2°F | Resp 16 | Wt 182.0 lb

## 2013-08-15 DIAGNOSIS — IMO0001 Reserved for inherently not codable concepts without codable children: Secondary | ICD-10-CM

## 2013-08-15 DIAGNOSIS — M545 Low back pain: Secondary | ICD-10-CM

## 2013-08-15 DIAGNOSIS — E010 Iodine-deficiency related diffuse (endemic) goiter: Secondary | ICD-10-CM

## 2013-08-15 DIAGNOSIS — E049 Nontoxic goiter, unspecified: Secondary | ICD-10-CM

## 2013-08-15 MED ORDER — NAPROXEN 500 MG PO TABS: 500.0000 mg | ORAL_TABLET | Freq: Two times a day (BID) | ORAL | Status: DC

## 2013-08-15 MED ORDER — CYCLOBENZAPRINE HCL 5 MG PO TABS: ORAL_TABLET | ORAL | Status: DC

## 2013-08-15 NOTE — Assessment & Plan Note (Signed)
New.  Get [place] to assess.  Will follow.

## 2013-08-15 NOTE — Patient Instructions (Signed)
Start the Naproxen twice daily for 5-7 days and then as needed- take w/ food Use the flexeril at night for muscle spasm Heating pad as needed We'll call you with your ultrasound appt Call with any questions or concerns Hang in there!

## 2013-08-15 NOTE — Assessment & Plan Note (Signed)
New to provider.  Pt w/ trap spasm from increased activity at work.  Start scheduled NSAIDs and muscle relaxer.  Heat.  Reviewed supportive care and red flags that should prompt return.  Pt expressed understanding and is in agreement w/ plan.

## 2013-08-15 NOTE — Progress Notes (Signed)
  Subjective:    Patient ID: Kristen Murray, female    DOB: Oct 09, 1946, 67 y.o.   MRN: [id number]  HPI Shoulder pain- pain in R shoulder blade, radiating up into neck and down into upper arm.  Pt reports back 'clamped up' yesterday.  Denies weakness of the arm.  No numbness and tingling.  Hx of neck arthritis w/ radiculopathy.  Has previously taken neurontin w/ some relief.  Neck mass- pt reports R side of neck protrudes when back spasms.  Manager at work asked 'what's wrong w/ your neck'.   Review of Systems For ROS see HPI     Objective:   Physical Exam  Vitals reviewed. Constitutional: She appears well-developed and well-nourished. No distress.  Neck: Normal range of motion. Neck supple. Thyromegaly (R thyroid enlargement w/out tenderness) present.  R lower trap spasm  Musculoskeletal: She exhibits tenderness (over R lower trap). She exhibits no edema.  Full ROM or R shoulder and elbow  Neurological: She has normal reflexes. Coordination normal.  Skin: Skin is warm and dry.          Assessment & Plan:

## 2013-08-19 ENCOUNTER — Ambulatory Visit (HOSPITAL_BASED_OUTPATIENT_CLINIC_OR_DEPARTMENT_OTHER)
Admission: RE | Admit: 2013-08-19 | Discharge: 2013-08-19 | Disposition: A | Discharge status: Home / Self Care | Payer: Medicare Other | Source: Ambulatory Visit | Attending: Family Medicine | Admitting: Family Medicine

## 2013-08-19 DIAGNOSIS — E010 Iodine-deficiency related diffuse (endemic) goiter: Secondary | ICD-10-CM

## 2013-08-19 DIAGNOSIS — E042 Nontoxic multinodular goiter: Secondary | ICD-10-CM | POA: Insufficient documentation

## 2013-08-20 ENCOUNTER — Other Ambulatory Visit: Payer: Self-pay | Admitting: General Practice

## 2013-08-20 ENCOUNTER — Telehealth: Payer: Self-pay | Admitting: Internal Medicine

## 2013-08-20 DIAGNOSIS — M5412 Radiculopathy, cervical region: Secondary | ICD-10-CM

## 2013-08-20 MED ORDER — GABAPENTIN 100 MG PO CAPS: 100.0000 mg | ORAL_CAPSULE | Freq: Three times a day (TID) | ORAL | Status: DC | PRN

## 2013-08-20 NOTE — Telephone Encounter (Signed)
Med filled and pt notified.  

## 2013-08-20 NOTE — Telephone Encounter (Signed)
Please advise, pt seen Kristen Murray on 10-17 for this issue and was prescribed naprosyn and flexeril. Gabapentin was last prescribed 07/2011 #30 with 2 refills. Please advise.

## 2013-08-20 NOTE — Telephone Encounter (Signed)
Patient call and stated that she is still expereceing shoulder pain. Patient wanted to know was there something else that could be called in or what else can she do about the pain. Also patient stated that she has seen dr hopper before for the same situation and prescribed her gabapentin and it worked.    Pharmacy [facility] [address]

## 2013-08-20 NOTE — Telephone Encounter (Signed)
Assess response to the gabapentin 100 mg one every 8 hours as needed. If it is partially beneficial, it can be increased up to a total of 3 pills every 8 hours as needed. This increase of 1 pill each dose  should take place over 72 hours at least. #30

## 2013-10-17 ENCOUNTER — Encounter: Payer: Self-pay | Admitting: Gynecology

## 2013-10-17 ENCOUNTER — Ambulatory Visit (INDEPENDENT_AMBULATORY_CARE_PROVIDER_SITE_OTHER): Payer: Medicare Other | Admitting: Gynecology

## 2013-10-17 VITALS — BP 130/80 | Ht 64.5 in | Wt 180.0 lb

## 2013-10-17 DIAGNOSIS — N899 Noninflammatory disorder of vagina, unspecified: Secondary | ICD-10-CM

## 2013-10-17 DIAGNOSIS — N898 Other specified noninflammatory disorders of vagina: Secondary | ICD-10-CM

## 2013-10-17 DIAGNOSIS — Z780 Asymptomatic menopausal state: Secondary | ICD-10-CM

## 2013-10-17 DIAGNOSIS — N951 Menopausal and female climacteric states: Secondary | ICD-10-CM

## 2013-10-17 DIAGNOSIS — N952 Postmenopausal atrophic vaginitis: Secondary | ICD-10-CM

## 2013-10-17 LAB — WET PREP FOR TRICH, YEAST, CLUE: Trich, Wet Prep: NONE SEEN

## 2013-10-17 MED ORDER — NONFORMULARY OR COMPOUNDED ITEM: Status: DC

## 2013-10-17 NOTE — Patient Instructions (Signed)
Shingles Vaccine  What You Need to Know  WHAT IS SHINGLES?  · Shingles is a painful skin rash, often with blisters. It is also called Herpes Zoster or just Zoster.  · A shingles rash usually appears on one side of the face or body and lasts from 2 to 4 weeks. Its main symptom is pain, which can be quite severe. Other symptoms of shingles can include fever, headache, chills, and upset stomach. Very rarely, a shingles infection can lead to pneumonia, hearing problems, blindness, brain inflammation (encephalitis), or death.  · For about 1 person in 5, severe pain can continue even after the rash clears up. This is called post-herpetic neuralgia.  · Shingles is caused by the Varicella Zoster virus. This is the same virus that causes chickenpox. Only someone who has had a case of chickenpox or rarely, has gotten chickenpox vaccine, can get shingles. The virus stays in your body. It can reappear many years later to cause a case of shingles.  · You cannot catch shingles from another person with shingles. However, a person who has never had chickenpox (or chickenpox vaccine) could get chickenpox from someone with shingles. This is not very common.  · Shingles is far more common in people 50 and older than in younger people. It is also more common in people whose immune systems are weakened because of a disease such as cancer or drugs such as steroids or chemotherapy.  · At least 1 million people get shingles per year in the [place].  SHINGLES VACCINE  · A vaccine for shingles was licensed in 2006. In clinical trials, the vaccine reduced the risk of shingles by 50%. It can also reduce the pain in people who still get shingles after being vaccinated.  · A single dose of shingles vaccine is recommended for adults 60 years of age and older.  SOME PEOPLE SHOULD NOT GET SHINGLES VACCINE OR SHOULD WAIT  A person should not get shingles vaccine if he or she:  · Has ever had a life-threatening allergic reaction to gelatin, the  antibiotic neomycin, or any other component of shingles vaccine. Tell your caregiver if you have any severe allergies.  · Has a weakened immune system because of current:  · AIDS or another disease that affects the immune system.  · Treatment with drugs that affect the immune system, such as prolonged use of high-dose steroids.  · Cancer treatment, such as radiation or chemotherapy.  · Cancer affecting the bone marrow or lymphatic system, such as leukemia or lymphoma.  · Is pregnant, or might be pregnant. Women should not become pregnant until at least 4 weeks after getting shingles vaccine.  Someone with a minor illness, such as a cold, may be vaccinated. Anyone with a moderate or severe acute illness should usually wait until he or she recovers before getting the vaccine. This includes anyone with a temperature of 101.3° F (38° C) or higher.  WHAT ARE THE RISKS FROM SHINGLES VACCINE?  · A vaccine, like any medicine, could possibly cause serious problems, such as severe allergic reactions. However, the risk of a vaccine causing serious harm, or death, is extremely small.  · No serious problems have been identified with shingles vaccine.  Mild Problems  · Redness, soreness, swelling, or itching at the site of the injection (about 1 person in 3).  · Headache (about 1 person in 70).  Like all vaccines, shingles vaccine is being closely monitored for unusual or severe problems.  WHAT IF   THERE IS A MODERATE OR SEVERE REACTION?  What should I look for?  Any unusual condition, such as a severe allergic reaction or a high fever. If a severe allergic reaction occurred, it would be within a few minutes to an hour after the shot. Signs of a serious allergic reaction can include difficulty breathing, weakness, hoarseness or wheezing, a fast heartbeat, hives, dizziness, paleness, or swelling of the throat.  What should I do?  · Call your caregiver, or get the person to a caregiver right away.  · Tell the caregiver what  happened, the date and time it happened, and when the vaccination was given.  · Ask the caregiver to report the reaction by filing a Vaccine Adverse Event Reporting System (VAERS) form. Or, you can file this report through the VAERS web site at www.vaers.hhs.gov or by calling 1-800-822-7967.  VAERS does not provide medical advice.  HOW CAN I LEARN MORE?  · Ask your caregiver. He or she can give you the vaccine package insert or suggest other sources of information.  · Contact the Centers for Disease Control and Prevention (CDC):  · Call 1-800-232-4636 (1-800-CDC-INFO).  · Visit the CDC website at www.cdc.gov/vaccines  CDC Shingles Vaccine VIS (08/04/08)  Document Released: 08/13/2006 Document Revised: 01/08/2012 Document Reviewed: 02/05/2013  ExitCare® Patient Information ©2014 [employer].

## 2013-10-17 NOTE — Progress Notes (Signed)
Kristen Murray 01/16/1946 [id number]   History:    67 y.o.  for annual gyn exam complaining of vaginal dryness and irritation. Recently she had a slight discharge and bought over-the-counter Monistat but feels that her symptoms have not resolved completely. Patient is currently on no hormone replacement therapy.Patient with past history of total abdominal hysterectomy with bilateral salpingo-oophorectomy. Patient with no prior history of abnormal Pap smears. Patient's last mammogram 2014 normal. Patient has had her flu vaccine but has not had a shingles vaccine yet.   Past medical history,surgical history, family history and social history were all reviewed and documented in the EPIC chart.  Gynecologic History Patient's last menstrual period was 07/30/1990. Contraception: status post hysterectomy Last Pap: 2012. Results were: normal Last mammogram: 2014. Results were: normal  Obstetric History OB History  Gravida Para Term Preterm AB SAB TAB Ectopic Multiple Living  3 3 3       3     # Outcome Date GA Lbr Len/2nd Weight Sex Delivery Anes PTL Lv  3 TRM     F SVD  N Y  2 TRM     F SVD  N Y  1 TRM     F SVD  N Y       ROS: A ROS was performed and pertinent positives and negatives are included in the history.  GENERAL: No fevers or chills. HEENT: No change in vision, no earache, sore throat or sinus congestion. NECK: No pain or stiffness. CARDIOVASCULAR: No chest pain or pressure. No palpitations. PULMONARY: No shortness of breath, cough or wheeze. GASTROINTESTINAL: No abdominal pain, nausea, vomiting or diarrhea, melena or bright red blood per rectum. GENITOURINARY: No urinary frequency, urgency, hesitancy or dysuria. MUSCULOSKELETAL: No joint or muscle pain, no back pain, no recent trauma. DERMATOLOGIC: No rash, no itching, no lesions. ENDOCRINE: No polyuria, polydipsia, no heat or cold intolerance. No recent change in weight. HEMATOLOGICAL: No anemia or easy bruising or bleeding.  NEUROLOGIC: No headache, seizures, numbness, tingling or weakness. PSYCHIATRIC: No depression, no loss of interest in normal activity or change in sleep pattern.     Exam: chaperone present  BP 130/80  Ht 5' 4.5" (1.638 m)  Wt 81.647 kg (180 lb)  BMI 30.43 kg/m2  LMP 07/30/1990  Body mass index is 30.43 kg/(m^2).  General appearance : Well developed well nourished female. No acute distress HEENT: Neck supple, trachea midline, no carotid bruits, no thyroidmegaly Lungs: Clear to auscultation, no rhonchi or wheezes, or rib retractions  Heart: Regular rate and rhythm, no murmurs or gallops Breast:Examined in sitting and supine position were symmetrical in appearance, no palpable masses or tenderness,  no skin retraction, no nipple inversion, no nipple discharge, no skin discoloration, no axillary or supraclavicular lymphadenopathy Abdomen: no palpable masses or tenderness, no rebound or guarding Extremities: no edema or skin discoloration or tenderness  Pelvic:  Bartholin, Urethra, Skene Glands: Within normal limits             Vagina:absent, atrophic changes  Cervix: absent  Uterus  Absence  Adnexa  Without masses or tenderness  Anus and perineum  normal   Rectovaginal  normal sphincter tone without palpated masses or tenderness             Hemoccult PCP provides   Wet prep positive for yeast  Assessment/Plan:  67 y.o. female would vulvovaginitis secondary to yeast. Prescription for Diflucan 150 mg was provided. We discussed treatments for vaginal atrophy such as with topical vaginal  estrogen 0.02% to apply twice a week. Risks benefits pros and cons of ERT was discussed. Patient will schedule her bone density study. We discussed importance of regular exercise as well as calcium and vitamin D for osteoporosis prevention. She is scheduled for a mammogram in January. She was given a prescription for her to have her shingles vaccine. Pap smear not done today in accordance to the new guide  lines.  Note: This dictation was prepared with  Dragon/digital dictation along withSmart phrase technology. Any transcriptional errors that result from this process are unintentional.   Ok Edwards MD, 10:17 AM 10/17/2013

## 2013-10-20 ENCOUNTER — Encounter: Payer: Self-pay | Admitting: Gynecology

## 2013-10-27 ENCOUNTER — Telehealth: Payer: Self-pay | Admitting: *Deleted

## 2013-10-27 ENCOUNTER — Other Ambulatory Visit: Payer: Self-pay | Admitting: *Deleted

## 2013-10-27 MED ORDER — VALACYCLOVIR HCL 500 MG PO TABS: 500.0000 mg | ORAL_TABLET | ORAL | Status: DC | PRN

## 2013-10-27 NOTE — Telephone Encounter (Signed)
Patient requested a refill for Zalacyclovir and patient has called her pharmacy but they told her to call over here to as well.    Pharmacy   [id number] - [place], [place] - [address]

## 2013-10-27 NOTE — Telephone Encounter (Signed)
Refilled valtrex. Jg//CMA

## 2013-11-07 ENCOUNTER — Encounter: Payer: Self-pay | Admitting: Gynecology

## 2013-11-12 ENCOUNTER — Encounter: Payer: Self-pay | Admitting: Internal Medicine

## 2013-11-12 ENCOUNTER — Ambulatory Visit (INDEPENDENT_AMBULATORY_CARE_PROVIDER_SITE_OTHER): Payer: Medicare Other | Admitting: Internal Medicine

## 2013-11-12 VITALS — BP 164/75 | HR 97 | Temp 97.8°F | Wt 184.4 lb

## 2013-11-12 DIAGNOSIS — K130 Diseases of lips: Secondary | ICD-10-CM

## 2013-11-12 DIAGNOSIS — R220 Localized swelling, mass and lump, head: Secondary | ICD-10-CM

## 2013-11-12 MED ORDER — PREDNISONE 20 MG PO TABS: 20.0000 mg | ORAL_TABLET | Freq: Two times a day (BID) | ORAL | Status: DC

## 2013-11-12 MED ORDER — HYDROXYZINE HCL 10 MG PO TABS: 10.0000 mg | ORAL_TABLET | Freq: Four times a day (QID) | ORAL | Status: DC | PRN

## 2013-11-12 NOTE — Progress Notes (Signed)
   Subjective:    Patient ID: Kristen Murray, female    DOB: August 13, 1946, 68 y.o.   MRN: [id number]  HPI  Her symptoms began 11/07/12 as nonproductive cough, sneezing, and rhinitis. She questions whether this might be related to ingesting mixed salad greens with nutrients & nuts/seeds. 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  She also noted some discomfort in the medial aspect of the right eye last night with some redness. Subsequently had redness on the left as well. There was no purulent discharge, double vision, blurred vision, or loss of vision.  Last night she began to have some tingling around her mouth & she began to have some swelling of her lower lip. By 3 AM the lower lip was completely swollen.  She has had some soreness of her palate.  She has a history of hives with codeine. She has taken tramadol from her orthopedist for trigger finger. Her last dose was 40 hours ago. To date she's had no reaction with that medication.Not on ACE-I or ARB Review of Systems    She denies fever, chills, or sweats. She's had no associated rash or change in color or temperature of skin.  She has no frontal headache, facial pain, nasal purulence, dental pain, sore throat, otic pain, or otic discharge.  The cough is not associated with shortness of breath or wheezing.     Objective:   Physical Exam General appearance:good health ;well nourished; no acute distress or increased work of breathing is present.  No  lymphadenopathy about the head, neck, or axilla noted.   Eyes: No conjunctival inflammation or lid edema is present. EOM & vision intact  Ears:  External ear exam shows no significant lesions or deformities.  Otoscopic examination reveals clear canals, tympanic membranes are intact bilaterally without bulging, retraction, inflammation or discharge.  Nose:  External nasal examination shows no deformity or inflammation. Nasal mucosa are pink and moist without lesions  or exudates. No septal dislocation or deviation.No obstruction to airflow.   Oral exam: Dental hygiene is good; lower lip swollen.There is no oropharyngeal erythema or exudate noted. Osteoma of hard palate  Neck:  No deformities,  masses, or tenderness noted.   Supple with good range of motion without pain. No UAO with hyperventilation   Heart:  Normal rate and regular rhythm. S1 and S2 normal without gallop,  click, rub or other extra sounds. Grade 1/2 over 6 systolic murmur Lungs:Chest clear to auscultation; no wheezes, rhonchi,rales ,or rubs present.No increased work of breathing.    Extremities:  No cyanosis, edema, or clubbing  noted    Skin: Warm & dry . No rash or dermatographia         Assessment & Plan:  #1 localized angioedema of the lower lip, most likely related to nuts in salads she's been ingesting. She does have hives with  codeine but has not had a reaction to date with tramadol.  Plan: See orders

## 2013-11-12 NOTE — Progress Notes (Signed)
Pre visit review using our clinic review tool, if applicable. No additional management support is needed unless otherwise documented below in the visit note. 

## 2013-11-12 NOTE — Patient Instructions (Addendum)
Avoid perfumes and cosmetics which are not hypoallergenic. Restrict hyperallergenic foods at this time: Nuts, strawberries, seafood , chocolate, and tomatoes. Use an anti-inflammatory cream such as Aspercreme or Zostrix cream twice a day to the affected area as needed. In lieu of this warm moist compresses or  hot water bottle can be used. Do not apply ice . To ER if swelling progresses or is associaled with Warning Signs as discussed.

## 2013-12-23 ENCOUNTER — Encounter: Payer: Self-pay | Admitting: Internal Medicine

## 2013-12-23 ENCOUNTER — Ambulatory Visit (INDEPENDENT_AMBULATORY_CARE_PROVIDER_SITE_OTHER): Payer: Medicare Other | Admitting: Internal Medicine

## 2013-12-23 ENCOUNTER — Other Ambulatory Visit: Payer: Medicare Other

## 2013-12-23 ENCOUNTER — Other Ambulatory Visit: Payer: Self-pay | Admitting: Internal Medicine

## 2013-12-23 ENCOUNTER — Other Ambulatory Visit: Payer: Self-pay | Admitting: *Deleted

## 2013-12-23 ENCOUNTER — Ambulatory Visit: Payer: Medicare Other

## 2013-12-23 VITALS — BP 150/70 | HR 89 | Temp 98.1°F | Resp 12 | Wt 184.8 lb

## 2013-12-23 DIAGNOSIS — E1149 Type 2 diabetes mellitus with other diabetic neurological complication: Secondary | ICD-10-CM

## 2013-12-23 DIAGNOSIS — G479 Sleep disorder, unspecified: Secondary | ICD-10-CM

## 2013-12-23 DIAGNOSIS — E119 Type 2 diabetes mellitus without complications: Secondary | ICD-10-CM

## 2013-12-23 DIAGNOSIS — Z23 Encounter for immunization: Secondary | ICD-10-CM

## 2013-12-23 LAB — MICROALBUMIN / CREATININE URINE RATIO
CREATININE, U: 64.3 mg/dL
MICROALB/CREAT RATIO: 0.9 mg/g (ref 0.0–30.0)
Microalb, Ur: 0.6 mg/dL (ref 0.0–1.9)

## 2013-12-23 LAB — HEMOGLOBIN A1C: Hgb A1c MFr Bld: 7.1 % — ABNORMAL HIGH (ref 4.6–6.5)

## 2013-12-23 MED ORDER — METFORMIN HCL 500 MG PO TABS: ORAL_TABLET | ORAL | Status: DC

## 2013-12-23 MED ORDER — GLUCOSE BLOOD VI STRP: ORAL_STRIP | Status: DC

## 2013-12-23 MED ORDER — FREESTYLE LANCETS MISC: Status: DC

## 2013-12-23 NOTE — Progress Notes (Signed)
Pre visit review using our clinic review tool, if applicable. No additional management support is needed unless otherwise documented below in the visit note. 

## 2013-12-23 NOTE — Assessment & Plan Note (Signed)
See AVS

## 2013-12-23 NOTE — Progress Notes (Signed)
Subjective:    Patient ID: Kristen Murray, female    DOB: 1946-02-23, 68 y.o.   MRN: [id number]  HPI Diabetes status assessment: Fasting or morning glucose range 115-134;average is 125. Highest glucose 2 hours after any meal is not monitored. No hypoglycemia reported                                                                                                                 Regular exercise described as walking >5  times per week for 30-45 minutes as of 1 week. No specific nutrition/diet followed except low fat No DM medications. Eye exam current; F/U in few weeks. Foot care current from Dr Doran Durand.She has ? hammer toe.  A1c 6.5% on 9 /20/14;urine microalbumin was normal.    Review of Systems    No excess thirst ;  excess hunger ; or excess urination reported                              No lightheadedness with standing reported No chest pain ; palpitations ; claudication described .                                                                                                                             No non healing skin  ulcers or sores of extremities noted. No numbness or tingling or burning in feet described                                                                                                                                             No significant change in weight since 7/14. No blurred,double, or loss of vision reported  .  No medication adverse effects noted.  She has had early awakening & "tosses & turns" until arising for several months and for  years intermittently.      Objective:   Physical Exam  Gen.: Healthy  & well-nourished, appropriate and alert, weight Eyes: No lid/conjunctival changes, extraocular motion intact, fundi reveal arteriolar narrowing Neck: Normal range of motion, thyroid normal Respiratory: No increased work of breathing or abnormal breath sounds Cardiac : regular rhythm, no extra heart sounds, gallop.Grade 1/6 systolic  murmur Abdomen: No organomegaly ,masses, bruits or aortic enlargement Lymph: No lymphadenopathy of the neck or axilla Skin: No rashes, lesions, ulcers or ischemic changes Muscle skeletal: no nail changes; joints normal Vasc:All pulses intact, no bruits present Neuro: Normal deep tendon reflexes, alert & oriented, sensation over feet normal Psych: judgment and insight, mood and affect normal        Assessment & Plan:  #1 DM #2 recurrent, intermittent sleep disorder See AVS

## 2013-12-23 NOTE — Patient Instructions (Addendum)
Your next office appointment will be determined based upon review of your pending labs. Those instructions will be transmitted to you through My Chart .  Follow a low carb nutrition program such as The New Sugar Busters as closely as possible to prevent Diabetes progression & complications. White carbohydrates (potatoes, rice, bread, and pasta) have a high spike of sugar and a high load of sugar. For example a  baked potato has a cup of sugar and a  french fry  2 teaspoons of sugar. Yams, wild  rice, whole grained bread &  wheat pasta have been much lower spike and load of  Sugar. Cardiovascular exercise, this can be as simple a program as walking, is recommended 30-45 minutes 3-4 times per week. If you're not exercising you should take 6-8 weeks to build up to this level. To prevent sleep dysfunction follow these instructions for sleep hygiene. Do not read, watch TV, or eat in bed. Do not get into bed until you are ready to turn off the light &  to go to sleep. Do not ingest stimulants ( decongestants, diet pills, nicotine, caffeine) after the evening meal.Do not take daytime naps.

## 2013-12-24 ENCOUNTER — Other Ambulatory Visit: Payer: Self-pay | Admitting: Gynecology

## 2013-12-24 DIAGNOSIS — M8580 Other specified disorders of bone density and structure, unspecified site: Secondary | ICD-10-CM

## 2014-01-06 ENCOUNTER — Telehealth: Payer: Self-pay | Admitting: *Deleted

## 2014-01-06 NOTE — Telephone Encounter (Signed)
[facility] phoned to confirm receipt of a request that had been faxed yesterday for patient's diabetic supplies.  I offered to provide verbal order, but they need hard copy in order to ship.  She is re-faxing request to [phone].  CB# [id number] Ref # [id number]

## 2014-01-08 ENCOUNTER — Telehealth: Payer: Self-pay | Admitting: Internal Medicine

## 2014-01-08 NOTE — Telephone Encounter (Signed)
Phoned & left message with MD instructions with spouse.

## 2014-01-08 NOTE — Telephone Encounter (Signed)
   I've never heard of this medication causing joint symptoms. If it's taken on empty stomach it might cause some loose stools. Please try a half a pill at the end of the meal as a trial. If your symptoms persist; please come in to discuss

## 2014-01-08 NOTE — Telephone Encounter (Signed)
Patient states that she believes her Metformin is causing her to have pain in her joints. She reports incresead knee, shoulder and hand pain since starting the medication. She wants to know if Dr. Linna Darner would advise her to keep taking or if it needs to be changed to something different. Please advise.

## 2014-01-09 ENCOUNTER — Telehealth: Payer: Self-pay | Admitting: *Deleted

## 2014-01-09 MED ORDER — FREESTYLE LANCETS MISC: Status: AC

## 2014-01-09 MED ORDER — GLUCOSE BLOOD VI STRP: ORAL_STRIP | Status: AC

## 2014-01-09 NOTE — Telephone Encounter (Signed)
Cece, with [facility], phoned requesting status of faxed request for patient's diabetic testing supplies.  Scripts printing, awaiting MD signature and need to be faxed to [facility].  CB# [id number] ref # [id number]

## 2014-01-12 NOTE — Telephone Encounter (Signed)
Spoke with Pam with [facility] and she stated that they have received the signed form for diabetic testing supplies and a shipping will be sent out today.//AB/CMA

## 2014-01-20 ENCOUNTER — Ambulatory Visit (INDEPENDENT_AMBULATORY_CARE_PROVIDER_SITE_OTHER): Payer: Medicare Other

## 2014-01-20 ENCOUNTER — Other Ambulatory Visit: Payer: Self-pay | Admitting: Gynecology

## 2014-01-20 DIAGNOSIS — M8580 Other specified disorders of bone density and structure, unspecified site: Secondary | ICD-10-CM

## 2014-01-20 DIAGNOSIS — Z780 Asymptomatic menopausal state: Secondary | ICD-10-CM

## 2014-02-13 ENCOUNTER — Telehealth: Payer: Self-pay | Admitting: *Deleted

## 2014-02-13 NOTE — Telephone Encounter (Signed)
Patient left vm requesting referral to [facility]? Patient would like a call back when referral is ordered.

## 2014-02-13 NOTE — Telephone Encounter (Signed)
   I assume she is referring to Dr. Howell Rucks, gastroenterologist. Her insurance will require a diagnosis for the referral.

## 2014-02-20 ENCOUNTER — Other Ambulatory Visit: Payer: Self-pay | Admitting: Internal Medicine

## 2014-02-20 DIAGNOSIS — K219 Gastro-esophageal reflux disease without esophagitis: Secondary | ICD-10-CM

## 2014-02-20 NOTE — Telephone Encounter (Signed)
Pt was seen by Dr. Linna Darner at [facility] a few months ago for stomach problems.  She is on Ranitidine for the acid reflux.  She is still having problems.  Dr. Hassell Done Johnson's office won't schedule her without the referral.

## 2014-03-03 ENCOUNTER — Other Ambulatory Visit (INDEPENDENT_AMBULATORY_CARE_PROVIDER_SITE_OTHER): Payer: Medicare Other

## 2014-03-03 ENCOUNTER — Encounter: Payer: Self-pay | Admitting: Internal Medicine

## 2014-03-03 ENCOUNTER — Ambulatory Visit (INDEPENDENT_AMBULATORY_CARE_PROVIDER_SITE_OTHER): Payer: Medicare Other | Admitting: Internal Medicine

## 2014-03-03 VITALS — BP 150/70 | HR 92 | Temp 97.9°F | Resp 14 | Wt 172.6 lb

## 2014-03-03 DIAGNOSIS — IMO0001 Reserved for inherently not codable concepts without codable children: Secondary | ICD-10-CM

## 2014-03-03 LAB — CALCIUM: CALCIUM: 9.9 mg/dL (ref 8.4–10.5)

## 2014-03-03 LAB — POTASSIUM: POTASSIUM: 4 meq/L (ref 3.5–5.1)

## 2014-03-03 LAB — MAGNESIUM: Magnesium: 2 mg/dL (ref 1.5–2.5)

## 2014-03-03 NOTE — Progress Notes (Signed)
   Subjective:    Patient ID: Kristen Murray, female    DOB: [birth year], 68 y.o.   MRN: [id number]  HPI   Symptoms began 2-3 weeks ago as cramping pain in the left calf intermittently up to level V-7. It has become more constant but it does remain intermittent. She does stand at work up  to 6 hours at a time  She has had this problem over the past year but it was less frequent and less severe  Advil been of no benefit  Fasting blood sugars have averaged 112. She's not on a statin     Review of Systems  She denies any redness , swelling , temperature or color change in the area of discomfort  She's had no fever, chills, sweats, weight change  She has no numbness, tingling, weakness in the leg  There is no loss control of bladder or bowels  She has no abnormal bruising or bleeding      Objective:   Physical Exam Appears healthy and well-nourished & in no acute distress  No carotid bruits are present.No neck pain distention present at 10 - 15 degrees. Thyroid normal to palpation  Heart rhythm and rate are normal with no gallop or murmur  Chest is clear with no increased work of breathing  There is faint aortic bruit w/o  evidence of aortic aneurysm . No renal artery bruits  Abdomen soft with no organomegaly or masses. No HJR  No clubbing, cyanosis or edema present.  She has marked crepitus of the left knee with possible minor effusion   Pedal pulses are intact   No ischemic skin changes are present . Fingernails/ toenails healthy   Alert and oriented. Strength, tone, DTRs reflexes normal   Homans sign is definitely negative bilaterally.        Assessment & Plan:  #1 myalgia left calf; see orders and recommendations  #2 diabetes; control excellent  #3 mildly elevated hypertension; she'll be asked to monitor her blood pressure at home and report if over 140/90 on average

## 2014-03-03 NOTE — Patient Instructions (Addendum)
Please perform isometric exercises before going to bed & upon arising. Sit on side of the bed and raise up on toes to a count of 5. Then put pressure on the heels to a count of 5. Repeat this process 10 times. This will improve blood flow to the calves & help prevent cramps.Magcal may help the muscle symptoms as we discussed.  Minimal Blood Pressure Goal= AVERAGE < 140/90;  Ideal is an AVERAGE < 135/85. This AVERAGE should be calculated from @ least 5-7 BP readings taken @ different times of day on different days of week. You should not respond to isolated BP readings , but rather the AVERAGE for that week .Please bring your  blood pressure cuff to office visits to verify that it is reliable.It  can also be checked against the blood pressure device at the pharmacy. Finger or wrist cuffs are not dependable; an arm cuff is.

## 2014-03-03 NOTE — Progress Notes (Signed)
Pre visit review using our clinic review tool, if applicable. No additional management support is needed unless otherwise documented below in the visit note. 

## 2014-03-19 ENCOUNTER — Ambulatory Visit (INDEPENDENT_AMBULATORY_CARE_PROVIDER_SITE_OTHER): Payer: Medicare Other | Admitting: Internal Medicine

## 2014-03-19 ENCOUNTER — Ambulatory Visit (INDEPENDENT_AMBULATORY_CARE_PROVIDER_SITE_OTHER)
Admission: RE | Admit: 2014-03-19 | Discharge: 2014-03-19 | Disposition: A | Discharge status: Home / Self Care | Payer: Medicare Other | Source: Ambulatory Visit | Attending: Internal Medicine | Admitting: Internal Medicine

## 2014-03-19 ENCOUNTER — Encounter: Payer: Self-pay | Admitting: Internal Medicine

## 2014-03-19 VITALS — BP 148/76 | HR 81 | Temp 97.7°F | Resp 14 | Wt 175.0 lb

## 2014-03-19 DIAGNOSIS — R059 Cough, unspecified: Secondary | ICD-10-CM

## 2014-03-19 DIAGNOSIS — R05 Cough: Secondary | ICD-10-CM

## 2014-03-19 DIAGNOSIS — J309 Allergic rhinitis, unspecified: Secondary | ICD-10-CM

## 2014-03-19 MED ORDER — MONTELUKAST SODIUM 10 MG PO TABS: 10.0000 mg | ORAL_TABLET | Freq: Every day | ORAL | Status: DC

## 2014-03-19 NOTE — Progress Notes (Signed)
   Subjective:    Patient ID: Kristen Murray, female    DOB: [birth year], 68 y.o.   MRN: [id number]  HPI   She's had a cough since the beginning of February of this year. It has been stable and not progressive. It was not related to exposure to any ill individuals. She has paroxysms of cough lasting 3-4 seconds with associated chest discomfort.  The cough is nonproductive but she does feel as if she has mucus in the throat area.  Her nose states "moist" and she is blowing it every 2 hours or so.  She questioned whether pollen and dust may have been triggers.  Advil Cold & Sinus,OTC cough drops as well as Zyrtec were of only partial benefit  She has itchy, watery eyes. She also has sneezing and has noted some wheezing.  She noted some exertional dyspnea climbing stairs 03/15/14.  She's had some dyspepsia but the last episode was 2 weeks ago  She has never smoked.  Maternal uncle did have tuberculosis.  Review of Systems  She specifically denies fever, chills, or sweats  She has no nasal purulence or purulent sputum  There is no frontal or facial sinus pain.        Objective:   Physical Exam General appearance:good health ;well nourished; no acute distress or increased work of breathing is present.  No  lymphadenopathy about the head, neck, or axilla noted.   Eyes: No conjunctival inflammation or lid edema is present. There is no scleral icterus.  Ears:  External ear exam shows no significant lesions or deformities.  Otoscopic examination reveals clear canals, tympanic membranes are intact bilaterally without bulging, retraction, inflammation or discharge.  Nose:  External nasal examination shows no deformity or inflammation. Nasal mucosa are pink and moist without lesions or exudates. No septal dislocation or deviation.No obstruction to airflow.   Oral exam: Dental hygiene is good; lips and gums are healthy appearing.There is no oropharyngeal erythema or exudate noted.    Neck:  No deformities, thyromegaly, masses, or tenderness noted.   Supple with full range of motion without pain.   Heart:  Normal rate and regular rhythm. S1 and S2 normal without gallop, murmur, click, rub or other extra sounds.   Lungs:Chest clear to auscultation; no wheezes, rhonchi,rales ,or rubs present.No increased work of breathing.    Extremities:  No cyanosis, edema, or clubbing  noted    Skin: Warm & dry        Assessment & Plan:  #1 allergic rhinitis See orders & AVS

## 2014-03-19 NOTE — Patient Instructions (Addendum)
Plain Mucinex (NOT D) for thick secretions ;force NON dairy fluids .   Nasal cleansing in the shower as discussed with lather of mild shampoo.After 10 seconds wash off lather while  exhaling through nostrils. Make sure that all residual soap is removed to prevent irritation.  Flonase OR Nasacort AQ 1 spray in each nostril twice a day as needed. Use the "crossover" technique into opposite nostril spraying toward opposite ear @ 45 degree angle, not straight up into nostril.  Use a Neti pot daily only  as needed for significant sinus congestion; going from open side to congested side . Plain Allegra (NOT D )  160 daily , Loratidine 10 mg , OR Zyrtec 10 mg @ bedtime  as needed for itchy eyes & sneezing. Fill the  prescription for Montelucast if there is not dramatic improvement in the next 72 hours.

## 2014-03-19 NOTE — Progress Notes (Signed)
Pre visit review using our clinic review tool, if applicable. No additional management support is needed unless otherwise documented below in the visit note. 

## 2014-04-13 ENCOUNTER — Other Ambulatory Visit: Payer: Self-pay | Admitting: Gastroenterology

## 2014-04-21 ENCOUNTER — Encounter: Payer: Self-pay | Admitting: Internal Medicine

## 2014-04-21 ENCOUNTER — Ambulatory Visit (INDEPENDENT_AMBULATORY_CARE_PROVIDER_SITE_OTHER): Payer: Medicare Other | Admitting: Internal Medicine

## 2014-04-21 VITALS — BP 150/82 | HR 85 | Temp 98.2°F | Wt 174.0 lb

## 2014-04-21 DIAGNOSIS — M79651 Pain in right thigh: Secondary | ICD-10-CM

## 2014-04-21 DIAGNOSIS — M79609 Pain in unspecified limb: Secondary | ICD-10-CM

## 2014-04-21 MED ORDER — ETODOLAC 400 MG PO TABS: 400.0000 mg | ORAL_TABLET | Freq: Two times a day (BID) | ORAL | Status: DC

## 2014-04-21 NOTE — Patient Instructions (Signed)
Use an anti-inflammatory cream such as Aspercreme or Zostrix cream twice a day to the affected area as needed. In lieu of this warm moist compresses or  hot water bottle can be used. Do not apply ice . 

## 2014-04-21 NOTE — Progress Notes (Signed)
Pre visit review using our clinic review tool, if applicable. No additional management support is needed unless otherwise documented below in the visit note. 

## 2014-04-21 NOTE — Progress Notes (Signed)
   Subjective:    Patient ID: Kristen Murray, female    DOB: Jun 20, 1946, 68 y.o.   MRN: [id number]  HPI   Her symptoms began 04/13/14 as dull, aching pain in the right mid thigh laterally. There was no specific injury or trigger but she had flown in from [place] on 6/14. She did not hear a pop or feel a snap at the time of the onset of pain. Pain level was up to a level VIII. It is constant in nature; but it is worse walking up stairs.  Ice was of no benefit. There is minimal response to aspirin. Heat was of benefit  She questions some swelling in the area of pain.  She's also had some pain in the right knee.      Review of Systems  She denies redness or stiffness in the joints  There is no change in color or temperature of  the skin in the area of discomfort  She has no fever, chills, sweats, change in weight   There is no limb weakness, numbness, or tingling  She has no neurologic symptoms of urinary or stool incontinence  She has no abnormal bruising or bleeding.     Objective:   Physical Exam  Significant or distinguishing  findings on physical exam are documented first.  Below that are other systems examined & findings.   Second heart sound is accentuated. There are no other abnormal heart sounds or murmurs.  She has dramatic crepitus of the left knee. Minor changes noted in the right. No associated effusions.  Tenderness to palpation over the right lateral thigh. There is some pain in this area with medial rotation of the right hip.  General appearance is one of good health and nourishment w/o distress.  Eyes: No conjunctival inflammation or scleral icterus is present.  Oral exam: Dental hygiene is good; lips and gums are healthy appearing.There is no oropharyngeal erythema or exudate noted.   Heart:  Normal rate and regular rhythm.    Lungs:Chest clear to auscultation; no wheezes, rhonchi,rales ,or rubs present.No increased work of breathing.   Abdomen: bowel  sounds normal, soft and non-tender without masses, organomegaly or hernias noted.  No guarding or rebound . No tenderness over the flanks to percussion  Musculoskeletal: Able to lie flat and sit up without help. Negative straight leg raising bilaterally. Gait normal  Skin:Warm & dry.  Intact without suspicious lesions or rashes ; no  tenting  Lymphatic: No lymphadenopathy is noted about the head, neck, axilla areas.                Assessment & Plan:  #1 right lateral thigh pain in the context of recent air travel. Clinically there is no evidence to suggest thrombotic process  Plan: See orders and recommendations

## 2014-05-06 ENCOUNTER — Telehealth: Payer: Self-pay | Admitting: *Deleted

## 2014-05-06 DIAGNOSIS — R7309 Other abnormal glucose: Secondary | ICD-10-CM

## 2014-05-06 NOTE — Telephone Encounter (Signed)
Patient will come by the lab to have her DM and cholesterol checked Lipid, a1c. bmet ordered Diabetic bundle

## 2014-05-14 ENCOUNTER — Ambulatory Visit (INDEPENDENT_AMBULATORY_CARE_PROVIDER_SITE_OTHER): Payer: Medicare Other | Admitting: Physician Assistant

## 2014-05-14 ENCOUNTER — Telehealth: Payer: Self-pay | Admitting: Physician Assistant

## 2014-05-14 ENCOUNTER — Encounter: Payer: Self-pay | Admitting: Physician Assistant

## 2014-05-14 ENCOUNTER — Ambulatory Visit: Payer: Medicare Other | Admitting: Family Medicine

## 2014-05-14 VITALS — BP 116/72 | HR 78 | Temp 98.2°F | Resp 16 | Ht 64.5 in | Wt 172.5 lb

## 2014-05-14 DIAGNOSIS — B029 Zoster without complications: Secondary | ICD-10-CM

## 2014-05-14 MED ORDER — BENZOCAINE-MENTHOL 20-0.5 % EX AERO: 1.0000 "application " | INHALATION_SPRAY | CUTANEOUS | Status: DC | PRN

## 2014-05-14 MED ORDER — LIDOCAINE 0.5 % EX AERO: INHALATION_SPRAY | CUTANEOUS | Status: DC

## 2014-05-14 MED ORDER — VALACYCLOVIR HCL 1 G PO TABS: 1000.0000 mg | ORAL_TABLET | Freq: Three times a day (TID) | ORAL | Status: DC

## 2014-05-14 NOTE — Telephone Encounter (Signed)
Pharmacy reports medication prescribed is not available; changed medication per VO provider via phone with pharmacist/SLS

## 2014-05-14 NOTE — Patient Instructions (Signed)
Take medication as directed until all tablets are gone.  Use lidocaine if needed.  Over-the-counter pain medication if needed.  Keep blisters covered.  Call or return if symptoms aren't improving over the next week.  Shingles Shingles (herpes zoster) is an infection that is caused by the same virus that causes chickenpox (varicella). The infection causes a painful skin rash and fluid-filled blisters, which eventually break open, crust over, and heal. It may occur in any area of the body, but it usually affects only one side of the body or face. The pain of shingles usually lasts about 1 month. However, some people with shingles may develop long-term (chronic) pain in the affected area of the body. Shingles often occurs many years after the person had chickenpox. It is more common:  In people older than 50 years.  In people with weakened immune systems, such as those with HIV, AIDS, or cancer.  In people taking medicines that weaken the immune system, such as transplant medicines.  In people under great stress. CAUSES  Shingles is caused by the varicella zoster virus (VZV), which also causes chickenpox. After a person is infected with the virus, it can remain in the person's body for years in an inactive state (dormant). To cause shingles, the virus reactivates and breaks out as an infection in a nerve root. The virus can be spread from person to person (contagious) through contact with open blisters of the shingles rash. It will only spread to people who have not had chickenpox. When these people are exposed to the virus, they may develop chickenpox. They will not develop shingles. Once the blisters scab over, the person is no longer contagious and cannot spread the virus to others. SYMPTOMS  Shingles shows up in stages. The initial symptoms may be pain, itching, and tingling in an area of the skin. This pain is usually described as burning, stabbing, or throbbing.In a few days or weeks, a painful  red rash will appear in the area where the pain, itching, and tingling were felt. The rash is usually on one side of the body in a band or belt-like pattern. Then, the rash usually turns into fluid-filled blisters. They will scab over and dry up in approximately 2-3 weeks. Flu-like symptoms may also occur with the initial symptoms, the rash, or the blisters. These may include:  Fever.  Chills.  Headache.  Upset stomach. DIAGNOSIS  Your caregiver will perform a skin exam to diagnose shingles. Skin scrapings or fluid samples may also be taken from the blisters. This sample will be examined under a microscope or sent to a lab for further testing. TREATMENT  There is no specific cure for shingles. Your caregiver will likely prescribe medicines to help you manage the pain, recover faster, and avoid long-term problems. This may include antiviral drugs, anti-inflammatory drugs, and pain medicines. HOME CARE INSTRUCTIONS   Take a cool bath or apply cool compresses to the area of the rash or blisters as directed. This may help with the pain and itching.   Only take over-the-counter or prescription medicines as directed by your caregiver.   Rest as directed by your caregiver.  Keep your rash and blisters clean with mild soap and cool water or as directed by your caregiver.  Do not pick your blisters or scratch your rash. Apply an anti-itch cream or numbing creams to the affected area as directed by your caregiver.  Keep your shingles rash covered with a loose bandage (dressing).  Avoid skin contact with:  Babies.   Pregnant women.   Children with eczema.   Elderly people with transplants.   People with chronic illnesses, such as leukemia or AIDS.   Wear loose-fitting clothing to help ease the pain of material rubbing against the rash.  Keep all follow-up appointments with your caregiver.If the area involved is on your face, you may receive a referral for follow-up to a  specialist, such as an eye doctor (ophthalmologist) or an ear, nose, and throat (ENT) doctor. Keeping all follow-up appointments will help you avoid eye complications, chronic pain, or disability.  SEEK IMMEDIATE MEDICAL CARE IF:   You have facial pain, pain around the eye area, or loss of feeling on one side of your face.  You have ear pain or ringing in your ear.  You have loss of taste.  Your pain is not relieved with prescribed medicines.   Your redness or swelling spreads.   You have more pain and swelling.  Your condition is worsening or has changed.   You have a feveror persistent symptoms for more than 2-3 days.  You have a fever and your symptoms suddenly get worse. MAKE SURE YOU:  Understand these instructions.  Will watch your condition.  Will get help right away if you are not doing well or get worse. Document Released: 10/16/2005 Document Revised: 07/10/2012 Document Reviewed: 05/30/2012 [facility] Patient Information 2015 [place], [place]. This information is not intended to replace advice given to you by your health care provider. Make sure you discuss any questions you have with your health care provider.

## 2014-05-14 NOTE — Telephone Encounter (Signed)
Pharmacy left a message requesting call back to clarify script for Lidocaine

## 2014-05-14 NOTE — Assessment & Plan Note (Signed)
Rx Valtrex. Rx Lidocaine spray.  OTC pain medication if needed.  Keep blisters covered.  Discussed course of illness including possibility of post-herpetic neuralgia.  Return precautions given to patient who voices understanding.

## 2014-05-14 NOTE — Progress Notes (Signed)
Pre visit review using our clinic review tool, if applicable. No additional management support is needed unless otherwise documented below in the visit note/SLS  

## 2014-05-14 NOTE — Progress Notes (Signed)
Patient presents to clinic today c/o burning and painful, blistering rash of her left outer thigh that has been present for the past 4 days.  States the area was tingling and burning before rash appeared.  Patient does have prior history of shingles outbreak.  Patient endorses some mild fatigue since onset of rash.  Denies fever, chills or other constitutional symptoms.   Past Medical History  Diagnosis Date  . HTN (hypertension)   . Hyperglycemia   . Radiculopathy   . GERD (gastroesophageal reflux disease)   . Meningioma   . Headache(784.0)   . Hyperlipidemia   . Borderline abnormal TFTs     as a teen  . Nephrolithiasis     Dr Amalia Hailey, [facility], Hx of [3][  . HSV-1 infection   . Polyp of colon   . E. coli UTI 10/05/12    [facility]; resistant only to Septra DS & tetracycline    Current Outpatient Prescriptions on File Prior to Visit  Medication Sig Dispense Refill  . aspirin 325 MG EC tablet Take 325 mg by mouth daily.      . cyclobenzaprine (FLEXERIL) 5 MG tablet 1-2 tablets at bedtime as needed  45 tablet  0  . glucose blood (FREESTYLE LITE) test strip CHECK BLOOD SUGAR ONCE DAILY AS DIRECTED.  DX:790.29  100 each  12  . Lancets (FREESTYLE) lancets Check blood sugar once daily as directed DX:790.29 (FREESTYLE FREEDOM LITE)  100 each  1  . mirabegron ER (MYRBETRIQ) 25 MG TB24 tablet Take 25 mg by mouth daily.      . montelukast (SINGULAIR) 10 MG tablet Take 1 tablet (10 mg total) by mouth at bedtime.  30 tablet  3  . Multiple Vitamin (MULTIVITAMIN) tablet Take 1 tablet by mouth daily.        . NONFORMULARY OR COMPOUNDED ITEM Estradiol .02% 1 ML Prefilled Applicator Sig: apply vaginally twice a week #90 Day Supply with 4 refills  1 each  4  . trimethoprim (TRIMPEX) 100 MG tablet Take 100 mg by mouth daily.       No current facility-administered medications on file prior to visit.    Allergies  Allergen Reactions  . Codeine     Hives Because of a history of documented adverse  serious drug reaction;Medi Alert bracelet  is recommended  . Propoxyphene Hcl     rash  . Latex     rash  . Other     Pine nuts & walnuts throat congestion. No documented angioedema.  . Ciprofloxacin     ? Nausea (Note: she takes this when on mission trips)  . Tramadol     ? nausea    Family History  Problem Relation Age of Onset  . Alcohol abuse Mother   . Diabetes Maternal Aunt   . Diabetes Maternal Uncle   . Diabetes Maternal Grandmother   . Tuberculosis Maternal Uncle   . Other Father     unknown  . Coronary artery disease Neg Hx     History   Social History  . Marital Status: Married    Spouse Name: N/A    Number of Children: N/A  . Years of Education: N/A   Occupational History  . Holiday representative    Social History Main Topics  . Smoking status: Never Smoker   . Smokeless tobacco: Never Used  . Alcohol Use: No  . Drug Use: No  . Sexual Activity: Yes    Birth Control/ Protection: Post-menopausal   Other  Topics Concern  . None   Social History Narrative   Regular Exercise-no         Review of Systems - See HPI.  All other ROS are negative.  BP 116/72  Pulse 78  Temp(Src) 98.2 F (36.8 C) (Oral)  Resp 16  Ht 5' 4.5" (1.638 m)  Wt 172 lb 8 oz (78.245 kg)  BMI 29.16 kg/m2  SpO2 98%  LMP 07/30/1990  Physical Exam  Vitals reviewed. Constitutional: She is oriented to person, place, and time and well-developed, well-nourished, and in no distress.  HENT:  Head: Normocephalic and atraumatic.  Eyes: Conjunctivae are normal.  Neck: Neck supple.  Cardiovascular: Normal rate, regular rhythm, normal heart sounds and intact distal pulses.   Pulmonary/Chest: Effort normal and breath sounds normal. No respiratory distress. She has no wheezes. She has no rales. She exhibits no tenderness.  Neurological: She is alert and oriented to person, place, and time.  Skin: Skin is warm and dry.     Psychiatric: Affect normal.    Recent Results (from the past 2160  hour(s))  CALCIUM     Status: None   Collection Time    03/03/14  3:53 PM      Result Value Ref Range   Calcium 9.9  8.4 - 10.5 mg/dL  MAGNESIUM     Status: None   Collection Time    03/03/14  3:53 PM      Result Value Ref Range   Magnesium 2.0  1.5 - 2.5 mg/dL  POTASSIUM     Status: None   Collection Time    03/03/14  3:53 PM      Result Value Ref Range   Potassium 4.0  3.5 - 5.1 mEq/L    Assessment/Plan: Shingles Rx Valtrex. Rx Lidocaine spray.  OTC pain medication if needed.  Keep blisters covered.  Discussed course of illness including possibility of post-herpetic neuralgia.  Return precautions given to patient who voices understanding.

## 2014-07-07 ENCOUNTER — Other Ambulatory Visit (INDEPENDENT_AMBULATORY_CARE_PROVIDER_SITE_OTHER): Payer: Medicare Other

## 2014-07-07 DIAGNOSIS — R7309 Other abnormal glucose: Secondary | ICD-10-CM

## 2014-07-07 LAB — LIPID PANEL
CHOL/HDL RATIO: 3
CHOLESTEROL: 190 mg/dL (ref 0–200)
HDL: 55.7 mg/dL (ref >39.00)
LDL CALC: 122 mg/dL — AB (ref 0–99)
NonHDL: 134.3
TRIGLYCERIDES: 64 mg/dL (ref 0.0–149.0)
VLDL: 12.8 mg/dL (ref 0.0–40.0)

## 2014-07-07 LAB — BASIC METABOLIC PANEL
BUN: 13 mg/dL (ref 6–23)
CALCIUM: 9.6 mg/dL (ref 8.4–10.5)
CO2: 30 meq/L (ref 19–32)
Chloride: 106 mEq/L (ref 96–112)
Creatinine, Ser: 0.8 mg/dL (ref 0.4–1.2)
GFR: 98.49 mL/min (ref >60.00)
Glucose, Bld: 101 mg/dL — ABNORMAL HIGH (ref 70–99)
Potassium: 4.3 mEq/L (ref 3.5–5.1)
SODIUM: 141 meq/L (ref 135–145)

## 2014-07-07 LAB — HEMOGLOBIN A1C: Hgb A1c MFr Bld: 6.7 % — ABNORMAL HIGH (ref 4.6–6.5)

## 2014-07-20 ENCOUNTER — Ambulatory Visit (HOSPITAL_COMMUNITY)
Admission: RE | Admit: 2014-07-20 | Discharge: 2014-07-20 | Disposition: A | Discharge status: Home / Self Care | Payer: Medicare Other | Source: Ambulatory Visit | Attending: Gastroenterology | Admitting: Gastroenterology

## 2014-07-20 ENCOUNTER — Encounter (HOSPITAL_COMMUNITY): Payer: Self-pay | Admitting: *Deleted

## 2014-07-20 ENCOUNTER — Encounter (HOSPITAL_COMMUNITY)
Admission: RE | Disposition: A | Discharge status: Home / Self Care | Payer: Self-pay | Source: Ambulatory Visit | Attending: Gastroenterology

## 2014-07-20 ENCOUNTER — Other Ambulatory Visit: Payer: Self-pay | Admitting: Gastroenterology

## 2014-07-20 DIAGNOSIS — Z90710 Acquired absence of both cervix and uterus: Secondary | ICD-10-CM | POA: Insufficient documentation

## 2014-07-20 DIAGNOSIS — R1310 Dysphagia, unspecified: Secondary | ICD-10-CM | POA: Insufficient documentation

## 2014-07-20 DIAGNOSIS — R12 Heartburn: Secondary | ICD-10-CM | POA: Insufficient documentation

## 2014-07-20 DIAGNOSIS — I10 Essential (primary) hypertension: Secondary | ICD-10-CM | POA: Diagnosis not present

## 2014-07-20 DIAGNOSIS — K219 Gastro-esophageal reflux disease without esophagitis: Secondary | ICD-10-CM | POA: Insufficient documentation

## 2014-07-20 DIAGNOSIS — K2940 Chronic atrophic gastritis without bleeding: Secondary | ICD-10-CM | POA: Diagnosis not present

## 2014-07-20 HISTORY — PX: BALLOON DILATION: SHX5330

## 2014-07-20 HISTORY — PX: ESOPHAGOGASTRODUODENOSCOPY: SHX5428

## 2014-07-20 LAB — GLUCOSE, CAPILLARY: Glucose-Capillary: 92 mg/dL (ref 70–99)

## 2014-07-20 SURGERY — EGD (ESOPHAGOGASTRODUODENOSCOPY)
Anesthesia: Moderate Sedation

## 2014-07-20 MED ORDER — FENTANYL CITRATE 0.05 MG/ML IJ SOLN
INTRAMUSCULAR | Status: AC
  Filled 2014-07-20: qty 2

## 2014-07-20 MED ORDER — FENTANYL CITRATE 0.05 MG/ML IJ SOLN
INTRAMUSCULAR | Status: DC | PRN
  Administered 2014-07-20: 25 ug via INTRAVENOUS
  Administered 2014-07-20: 50 ug via INTRAVENOUS

## 2014-07-20 MED ORDER — DIPHENHYDRAMINE HCL 50 MG/ML IJ SOLN
INTRAMUSCULAR | Status: AC
  Filled 2014-07-20: qty 1

## 2014-07-20 MED ORDER — BUTAMBEN-TETRACAINE-BENZOCAINE 2-2-14 % EX AERO
INHALATION_SPRAY | CUTANEOUS | Status: DC | PRN
  Administered 2014-07-20: 1 via TOPICAL

## 2014-07-20 MED ORDER — SODIUM CHLORIDE 0.9 % IV SOLN
INTRAVENOUS | Status: DC
  Administered 2014-07-20: 1000 mL via INTRAVENOUS

## 2014-07-20 MED ORDER — MIDAZOLAM HCL 10 MG/2ML IJ SOLN
INTRAMUSCULAR | Status: AC
  Filled 2014-07-20: qty 2

## 2014-07-20 MED ORDER — MIDAZOLAM HCL 10 MG/2ML IJ SOLN
INTRAMUSCULAR | Status: DC | PRN
  Administered 2014-07-20: 2.5 mg via INTRAVENOUS
  Administered 2014-07-20: 2 mg via INTRAVENOUS
  Administered 2014-07-20: 2.5 mg via INTRAVENOUS

## 2014-07-20 NOTE — Op Note (Signed)
Problem: Heartburn associated with intermittent esophageal dysphagia. Normal esophagogastroduodenoscopies performed in 1997 and 2011. Normal screening colonoscopies performed in 1997, 2005, 2010  Endoscopist: Earle Gell  Premedication: Versed 7 mg. Fentanyl 75 mcg.  Procedure: Diagnostic esophagogastroduodenoscopy with gastric biopsies to rule out H. pyloric gastritis and esophageal biopsies to rule out eosinophilic esophagitis The patient was placed in the left lateral decubitus position. The Pentax gastroscope was passed through the posterior hypopharynx into the proximal esophagus without difficulty. I did not visualize the vocal cords.  Esophagoscopy: The proximal, mid, and lower segments of the esophageal mucosa appeared normal except for a tissue paper appearance unassociated with mucosal friability. The squamocolumnar junction was regular in appearance and noted at 40 cm from the incisor teeth. Random esophageal biopsies were performed to look for eosinophilic esophagitis.  Gastroscopy: Retroflex view of the gastric cardia and fundus was normal. The gastric body, antrum, and pylorus appeared normal. Random gastric biopsies were performed to look for H. pylori gastritis  Duodenoscopy: The duodenal bulb and descending duodenum appeared normal  Assessment: Normal esophagogastroduodenoscopy. Random esophageal biopsies to look for eosinophilic esophagitis and random gastric biopsies to look for H. pylori gastritis pending.

## 2014-07-20 NOTE — H&P (Signed)
  Problem: Heartburn associated with esophageal dysphagia. Normal esophagogastroduodenoscopies performed in 1997 and 2011. Normal screening colonoscopies performed in 1997, 2005, 2010.  History: The patient is a 68 year old female born 27-Jan-1946 who has chronic gastroesophageal reflux and who takes ranitidine as needed.  The patient has developed frequent episodes of heartburn associated with intermittent esophageal dysphagia localized to the retroxiphoid area.  She is scheduled to undergo diagnostic esophagogastroduodenoscopy with possible esophageal stricture dilation and screen for H. pylori gastritis.  Medication allergies: Darvon. Cipro. Codeine. Latex.  Past medical history: Trigger finger release surgery. Carpal tunnel release surgery. Right shoulder rotator cuff repair. Arthroscopic knee surgery. Total abdominal hysterectomy with bilateral salpingo-oophorectomy. Hypertension. Gastroesophageal reflux.  Exam: The patient is alert and lying comfortably on the endoscopy stretcher. Abdomen is soft and nontender to palpation. Lungs are clear to auscultation. Cardiac exam reveals a regular rhythm  Plan: Proceed with diagnostic esophagogastroduodenoscopy with possible esophageal stricture dilation. Perform gastric biopsies to look for H. pylori gastritis

## 2014-07-21 ENCOUNTER — Encounter (HOSPITAL_COMMUNITY): Payer: Self-pay | Admitting: Gastroenterology

## 2014-08-17 ENCOUNTER — Other Ambulatory Visit: Payer: Self-pay

## 2014-08-17 MED ORDER — METFORMIN HCL 500 MG PO TABS
ORAL_TABLET | ORAL | Status: DC
Start: 2014-08-17 — End: 2018-11-12

## 2014-08-28 ENCOUNTER — Ambulatory Visit: Payer: Medicare Other | Admitting: Family Medicine

## 2014-08-31 ENCOUNTER — Encounter (HOSPITAL_COMMUNITY): Payer: Self-pay | Admitting: Gastroenterology

## 2014-09-08 ENCOUNTER — Ambulatory Visit: Payer: Medicare Other

## 2014-09-15 ENCOUNTER — Ambulatory Visit: Payer: Medicare Other

## 2014-09-22 ENCOUNTER — Ambulatory Visit: Payer: Medicare Other

## 2014-09-29 ENCOUNTER — Encounter: Payer: Medicare Other | Attending: Internal Medicine

## 2014-09-29 ENCOUNTER — Other Ambulatory Visit: Payer: Self-pay | Admitting: Internal Medicine

## 2014-09-29 ENCOUNTER — Telehealth: Payer: Self-pay | Admitting: Internal Medicine

## 2014-09-29 VITALS — Ht 65.0 in | Wt 176.2 lb

## 2014-09-29 DIAGNOSIS — Z713 Dietary counseling and surveillance: Secondary | ICD-10-CM | POA: Insufficient documentation

## 2014-09-29 DIAGNOSIS — E1149 Type 2 diabetes mellitus with other diabetic neurological complication: Secondary | ICD-10-CM

## 2014-09-29 DIAGNOSIS — E1140 Type 2 diabetes mellitus with diabetic neuropathy, unspecified: Secondary | ICD-10-CM | POA: Insufficient documentation

## 2014-09-29 NOTE — Telephone Encounter (Signed)
See orders ;due before 10/28/14

## 2014-09-29 NOTE — Telephone Encounter (Signed)
Pt is inquiring when she is due for her next A1c, her last OV was 6/15 and last A1c Sept 2015. Pls advise.

## 2014-09-29 NOTE — Telephone Encounter (Signed)
Patient advised via voicemail

## 2014-10-02 NOTE — Progress Notes (Signed)

## 2014-10-13 ENCOUNTER — Ambulatory Visit: Payer: Medicare Other

## 2014-10-20 DIAGNOSIS — R3915 Urgency of urination: Secondary | ICD-10-CM | POA: Insufficient documentation

## 2014-11-03 ENCOUNTER — Encounter: Payer: Medicare Other | Attending: Internal Medicine

## 2014-11-03 DIAGNOSIS — E1140 Type 2 diabetes mellitus with diabetic neuropathy, unspecified: Secondary | ICD-10-CM | POA: Diagnosis not present

## 2014-11-03 DIAGNOSIS — Z713 Dietary counseling and surveillance: Secondary | ICD-10-CM | POA: Diagnosis not present

## 2014-11-03 DIAGNOSIS — E1149 Type 2 diabetes mellitus with other diabetic neurological complication: Secondary | ICD-10-CM

## 2014-11-03 NOTE — Progress Notes (Signed)
Patient was seen on 11/03/14 for the third of a series of three diabetes self-management courses at the [facility]. The following learning objectives were met by the patient during this class:  . State the amount of activity recommended for healthy living . Describe activities suitable for individual needs . Identify ways to regularly incorporate activity into daily life . Identify barriers to activity and ways to over come these barriers  Identify diabetes medications being personally used and their primary action for lowering glucose and possible side effects . Describe role of stress on blood glucose and develop strategies to address psychosocial issues . Identify diabetes complications and ways to prevent them  Explain how to manage diabetes during illness . Evaluate success in meeting personal goal . Establish 2-3 goals that they will plan to diligently work on until they return for the  67-month follow-up visit  Goals:   I will be active 30 minutes or more 5 times a week  Your patient has identified these potential barriers to change:  None stated  Your patient has identified their diabetes self-care support plan as  Magazine Subscriptions Plan:  Attend Core 4 in 4 months

## 2014-11-05 ENCOUNTER — Ambulatory Visit (INDEPENDENT_AMBULATORY_CARE_PROVIDER_SITE_OTHER): Payer: Medicare Other | Admitting: Gynecology

## 2014-11-05 ENCOUNTER — Encounter: Payer: Self-pay | Admitting: Gynecology

## 2014-11-05 ENCOUNTER — Other Ambulatory Visit (HOSPITAL_COMMUNITY)
Admission: RE | Admit: 2014-11-05 | Discharge: 2014-11-05 | Disposition: A | Discharge status: Home / Self Care | Payer: Medicare Other | Source: Ambulatory Visit | Attending: Gynecology | Admitting: Gynecology

## 2014-11-05 VITALS — BP 126/80 | Ht 64.5 in | Wt 176.0 lb

## 2014-11-05 DIAGNOSIS — N930 Postcoital and contact bleeding: Secondary | ICD-10-CM

## 2014-11-05 DIAGNOSIS — Z1272 Encounter for screening for malignant neoplasm of vagina: Secondary | ICD-10-CM

## 2014-11-05 DIAGNOSIS — Z01419 Encounter for gynecological examination (general) (routine) without abnormal findings: Secondary | ICD-10-CM

## 2014-11-05 DIAGNOSIS — N952 Postmenopausal atrophic vaginitis: Secondary | ICD-10-CM

## 2014-11-05 DIAGNOSIS — Z1151 Encounter for screening for human papillomavirus (HPV): Secondary | ICD-10-CM | POA: Insufficient documentation

## 2014-11-05 DIAGNOSIS — B029 Zoster without complications: Secondary | ICD-10-CM

## 2014-11-05 DIAGNOSIS — Z124 Encounter for screening for malignant neoplasm of cervix: Secondary | ICD-10-CM | POA: Insufficient documentation

## 2014-11-05 MED ORDER — NONFORMULARY OR COMPOUNDED ITEM: Status: DC

## 2014-11-05 MED ORDER — VALACYCLOVIR HCL 1 G PO TABS: 1000.0000 mg | ORAL_TABLET | Freq: Three times a day (TID) | ORAL | Status: DC

## 2014-11-05 NOTE — Patient Instructions (Signed)
Shingles Vaccine What You Need to Know WHAT IS SHINGLES?  Shingles is a painful skin rash, often with blisters. It is also called Herpes Zoster or just Zoster.  A shingles rash usually appears on one side of the face or body and lasts from 2 to 4 weeks. Its main symptom is pain, which can be quite severe. Other symptoms of shingles can include fever, headache, chills, and upset stomach. Very rarely, a shingles infection can lead to pneumonia, hearing problems, blindness, brain inflammation (encephalitis), or death.  For about 1 person in 5, severe pain can continue even after the rash clears up. This is called post-herpetic neuralgia.  Shingles is caused by the Varicella Zoster virus. This is the same virus that causes chickenpox. Only someone who has had a case of chickenpox or rarely, has gotten chickenpox vaccine, can get shingles. The virus stays in your body. It can reappear many years later to cause a case of shingles.  You cannot catch shingles from another person with shingles. However, a person who has never had chickenpox (or chickenpox vaccine) could get chickenpox from someone with shingles. This is not very common.  Shingles is far more common in people 50 and older than in younger people. It is also more common in people whose immune systems are weakened because of a disease such as cancer or drugs such as steroids or chemotherapy.  At least 1 million people get shingles per year in the [place]. SHINGLES VACCINE  A vaccine for shingles was licensed in 2006. In clinical trials, the vaccine reduced the risk of shingles by 50%. It can also reduce the pain in people who still get shingles after being vaccinated.  A single dose of shingles vaccine is recommended for adults 60 years of age and older. SOME PEOPLE SHOULD NOT GET SHINGLES VACCINE OR SHOULD WAIT A person should not get shingles vaccine if he or she:  Has ever had a life-threatening allergic reaction to gelatin, the  antibiotic neomycin, or any other component of shingles vaccine. Tell your caregiver if you have any severe allergies.  Has a weakened immune system because of current:  AIDS or another disease that affects the immune system.  Treatment with drugs that affect the immune system, such as prolonged use of high-dose steroids.  Cancer treatment, such as radiation or chemotherapy.  Cancer affecting the bone marrow or lymphatic system, such as leukemia or lymphoma.  Is pregnant, or might be pregnant. Women should not become pregnant until at least 4 weeks after getting shingles vaccine. Someone with a minor illness, such as a cold, may be vaccinated. Anyone with a moderate or severe acute illness should usually wait until he or she recovers before getting the vaccine. This includes anyone with a temperature of 101.3 F (38 C) or higher. WHAT ARE THE RISKS FROM SHINGLES VACCINE?  A vaccine, like any medicine, could possibly cause serious problems, such as severe allergic reactions. However, the risk of a vaccine causing serious harm, or death, is extremely small.  No serious problems have been identified with shingles vaccine. Mild Problems  Redness, soreness, swelling, or itching at the site of the injection (about 1 person in 3).  Headache (about 1 person in 70). Like all vaccines, shingles vaccine is being closely monitored for unusual or severe problems. WHAT IF THERE IS A MODERATE OR SEVERE REACTION? What should I look for? Any unusual condition, such as a severe allergic reaction or a high fever. If a severe allergic reaction   occurred, it would be within a few minutes to an hour after the shot. Signs of a serious allergic reaction can include difficulty breathing, weakness, hoarseness or wheezing, a fast heartbeat, hives, dizziness, paleness, or swelling of the throat. What should I do?  Call your caregiver, or get the person to a caregiver right away.  Tell the caregiver what  happened, the date and time it happened, and when the vaccination was given.  Ask the caregiver to report the reaction by filing a Vaccine Adverse Event Reporting System (VAERS) form. Or, you can file this report through the VAERS web site at www.vaers.hhs.gov or by calling 1-800-822-7967. VAERS does not provide medical advice. HOW CAN I LEARN MORE?  Ask your caregiver. He or she can give you the vaccine package insert or suggest other sources of information.  Contact the Centers for Disease Control and Prevention (CDC):  Call 1-800-232-4636 (1-800-CDC-INFO).  Visit the CDC website at www.cdc.gov/vaccines CDC Shingles Vaccine VIS (08/04/08) Document Released: 08/13/2006 Document Revised: 01/08/2012 Document Reviewed: 02/05/2013 ExitCare Patient Information 2015 [employer]. This information is not intended to replace advice given to you by your health care provider. Make sure you discuss any questions you have with your health care provider.  

## 2014-11-05 NOTE — Progress Notes (Signed)
Kristen Murray [birth year] [id number]   History:    69 y.o.  for annual gyn exam with complaints of an isolated event when she had postcoital bleeding.Patient with past history of total abdominal hysterectomy with bilateral salpingo-oophorectomy. Patient with no prior history of abnormal Pap smears. 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 Patient's primary care physician is Dr. [name] who will be doing her blood work and who is treating her for hypertension and type 2 diabetes. Patient reports normal colonoscopy in 2011. She had a bone density study which was normal in 2015. Her flu vaccine is up-to-date but she has not had her shingles vaccine as of yet.  Past medical history,surgical history, family history and social history were all reviewed and documented in the EPIC chart.  Gynecologic History Patient's last menstrual period was 07/30/1990. Contraception: status post hysterectomy Last Pap: 2012. Results were: normal Last mammogram: 2015. Results were: normal  Obstetric History OB History  Gravida Para Term Preterm AB SAB TAB Ectopic Multiple Living  3 3 3       3     # Outcome Date GA Lbr Len/2nd Weight Sex Delivery Anes PTL Lv  3 Term     F Vag-Spont  N Y  2 Term     F Vag-Spont  N Y  1 Term     F Vag-Spont  N Y       ROS: A ROS was performed and pertinent positives and negatives are included in the history.  GENERAL: No fevers or chills. HEENT: No change in vision, no earache, sore throat or sinus congestion. NECK: No pain or stiffness. CARDIOVASCULAR: No chest pain or pressure. No palpitations. PULMONARY: No shortness of breath, cough or wheeze. GASTROINTESTINAL: No abdominal pain, nausea, vomiting or diarrhea, melena or bright red blood per rectum. GENITOURINARY: No urinary frequency, urgency, hesitancy or dysuria. MUSCULOSKELETAL: No joint or muscle pain,  no back pain, no recent trauma. DERMATOLOGIC: No rash, no itching, no lesions. ENDOCRINE: No polyuria, polydipsia, no heat or cold intolerance. No recent change in weight. HEMATOLOGICAL: No anemia or easy bruising or bleeding. NEUROLOGIC: No headache, seizures, numbness, tingling or weakness. PSYCHIATRIC: No depression, no loss of interest in normal activity or change in sleep pattern.     Exam: chaperone present  BP 126/80 mmHg  Ht 5' 4.5" (1.638 m)  Wt 176 lb (79.833 kg)  BMI 29.75 kg/m2  LMP 07/30/1990  Body mass index is 29.75 kg/(m^2).  General appearance : Well developed well nourished female. No acute distress HEENT: Neck supple, trachea midline, no carotid bruits, no thyroidmegaly Lungs: Clear to auscultation, no rhonchi or wheezes, or rib retractions  Heart: Regular rate and rhythm, no murmurs or gallops Breast:Examined in sitting and supine position were symmetrical in appearance, no palpable masses or tenderness,  no skin retraction, no nipple inversion, no nipple discharge, no skin discoloration, no axillary or supraclavicular lymphadenopathy Abdomen: no palpable masses or tenderness, no rebound or guarding Extremities: no edema or skin discoloration or tenderness  Pelvic:  Bartholin, Urethra, Skene Glands: Within normal limits             Vagina: No gross lesions or discharge, vaginal atrophy  Cervix: Absent  Uterus  Absent  Adnexa  Without masses or tenderness  Anus and perineum  normal   Rectovaginal  normal sphincter tone without palpated masses  or tenderness             Hemoccult PCP will provide     Assessment/Plan:  69 y.o. female for annual exam with single isolated event of postcoital bleeding probably attributed to vaginal atrophy. Patient with no past history of any abnormal Pap smear. Because of her recent event we didn't Pap smear today but will adhere to the guidelines after. She was given a prescription refill for the estradiol vaginal cream to apply twice  a week. Blood work will be done by her PCP. She was reminded on the importance of calcium vitamin D and regular exercise for osteoporosis prevention. We discussed importance of monthly breast exam. Requisition for her to go to the pharmacy for shingles vaccine provided.   Terrance Mass MD, 2:34 PM 11/05/2014

## 2014-11-09 LAB — CYTOLOGY - PAP

## 2014-11-10 ENCOUNTER — Telehealth: Payer: Self-pay | Admitting: *Deleted

## 2014-11-10 NOTE — Telephone Encounter (Signed)
Pt had questions about AVS from office visit 11/05/14. All questions answered.

## 2014-11-26 ENCOUNTER — Observation Stay (HOSPITAL_BASED_OUTPATIENT_CLINIC_OR_DEPARTMENT_OTHER)
Admission: EM | Admit: 2014-11-26 | Discharge: 2014-11-27 | Disposition: A | Discharge status: Home / Self Care | Payer: Medicare Other | Attending: Internal Medicine | Admitting: Internal Medicine

## 2014-11-26 ENCOUNTER — Emergency Department (HOSPITAL_BASED_OUTPATIENT_CLINIC_OR_DEPARTMENT_OTHER): Payer: Medicare Other

## 2014-11-26 ENCOUNTER — Encounter (HOSPITAL_BASED_OUTPATIENT_CLINIC_OR_DEPARTMENT_OTHER): Payer: Self-pay

## 2014-11-26 DIAGNOSIS — E1140 Type 2 diabetes mellitus with diabetic neuropathy, unspecified: Secondary | ICD-10-CM

## 2014-11-26 DIAGNOSIS — I10 Essential (primary) hypertension: Secondary | ICD-10-CM | POA: Diagnosis not present

## 2014-11-26 DIAGNOSIS — R079 Chest pain, unspecified: Principal | ICD-10-CM | POA: Diagnosis present

## 2014-11-26 DIAGNOSIS — Z7982 Long term (current) use of aspirin: Secondary | ICD-10-CM | POA: Diagnosis not present

## 2014-11-26 DIAGNOSIS — Z881 Allergy status to other antibiotic agents status: Secondary | ICD-10-CM | POA: Diagnosis not present

## 2014-11-26 DIAGNOSIS — K219 Gastro-esophageal reflux disease without esophagitis: Secondary | ICD-10-CM | POA: Diagnosis present

## 2014-11-26 DIAGNOSIS — Z91040 Latex allergy status: Secondary | ICD-10-CM | POA: Diagnosis not present

## 2014-11-26 DIAGNOSIS — Z885 Allergy status to narcotic agent status: Secondary | ICD-10-CM | POA: Diagnosis not present

## 2014-11-26 DIAGNOSIS — E1149 Type 2 diabetes mellitus with other diabetic neurological complication: Secondary | ICD-10-CM | POA: Diagnosis not present

## 2014-11-26 DIAGNOSIS — E785 Hyperlipidemia, unspecified: Secondary | ICD-10-CM | POA: Insufficient documentation

## 2014-11-26 DIAGNOSIS — Z888 Allergy status to other drugs, medicaments and biological substances status: Secondary | ICD-10-CM | POA: Diagnosis not present

## 2014-11-26 LAB — CBC WITH DIFFERENTIAL/PLATELET
BASOS ABS: 0 10*3/uL (ref 0.0–0.1)
Basophils Relative: 0 % (ref 0–1)
Eosinophils Absolute: 0.2 10*3/uL (ref 0.0–0.7)
Eosinophils Relative: 2 % (ref 0–5)
HEMATOCRIT: 39 % (ref 36.0–46.0)
Hemoglobin: 13 g/dL (ref 12.0–15.0)
Lymphocytes Relative: 34 % (ref 12–46)
Lymphs Abs: 2.7 10*3/uL (ref 0.7–4.0)
MCH: 29.2 pg (ref 26.0–34.0)
MCHC: 33.3 g/dL (ref 30.0–36.0)
MCV: 87.6 fL (ref 78.0–100.0)
MONOS PCT: 8 % (ref 3–12)
Monocytes Absolute: 0.7 10*3/uL (ref 0.1–1.0)
Neutro Abs: 4.5 10*3/uL (ref 1.7–7.7)
Neutrophils Relative %: 56 % (ref 43–77)
Platelets: 238 10*3/uL (ref 150–400)
RBC: 4.45 MIL/uL (ref 3.87–5.11)
RDW: 13.2 % (ref 11.5–15.5)
WBC: 8.1 10*3/uL (ref 4.0–10.5)

## 2014-11-26 LAB — BASIC METABOLIC PANEL
Anion gap: 3 — ABNORMAL LOW (ref 5–15)
BUN: 18 mg/dL (ref 6–23)
CO2: 27 mmol/L (ref 19–32)
Calcium: 9.2 mg/dL (ref 8.4–10.5)
Chloride: 106 mmol/L (ref 96–112)
Creatinine, Ser: 0.71 mg/dL (ref 0.50–1.10)
GFR calc Af Amer: >90 mL/min (ref >90)
GFR calc non Af Amer: 86 mL/min — ABNORMAL LOW (ref >90)
GLUCOSE: 125 mg/dL — AB (ref 70–99)
Potassium: 3.9 mmol/L (ref 3.5–5.1)
Sodium: 136 mmol/L (ref 135–145)

## 2014-11-26 LAB — GLUCOSE, CAPILLARY
GLUCOSE-CAPILLARY: 94 mg/dL (ref 70–99)
Glucose-Capillary: 131 mg/dL — ABNORMAL HIGH (ref 70–99)

## 2014-11-26 LAB — TROPONIN I: Troponin I: <0.03 ng/mL (ref <0.031)

## 2014-11-26 MED ORDER — MIRABEGRON ER 25 MG PO TB24
25.0000 mg | ORAL_TABLET | Freq: Every day | ORAL | Status: DC
  Administered 2014-11-27: 25 mg via ORAL
  Filled 2014-11-26: qty 1

## 2014-11-26 MED ORDER — ADULT MULTIVITAMIN W/MINERALS CH
1.0000 | ORAL_TABLET | Freq: Every day | ORAL | Status: DC
  Administered 2014-11-27: 1 via ORAL
  Filled 2014-11-26: qty 1

## 2014-11-26 MED ORDER — ENOXAPARIN SODIUM 40 MG/0.4ML ~~LOC~~ SOLN
40.0000 mg | SUBCUTANEOUS | Status: DC
Start: 2014-11-26 — End: 2014-11-27
  Administered 2014-11-26 – 2014-11-27 (×2): 40 mg via SUBCUTANEOUS
  Filled 2014-11-26 (×2): qty 0.4

## 2014-11-26 MED ORDER — ACETAMINOPHEN 325 MG PO TABS: 650.0000 mg | ORAL_TABLET | ORAL | Status: DC | PRN

## 2014-11-26 MED ORDER — CYCLOBENZAPRINE HCL 10 MG PO TABS: 5.0000 mg | ORAL_TABLET | Freq: Every evening | ORAL | Status: DC | PRN

## 2014-11-26 MED ORDER — INSULIN ASPART 100 UNIT/ML ~~LOC~~ SOLN: 0.0000 [IU] | Freq: Every day | SUBCUTANEOUS | Status: DC

## 2014-11-26 MED ORDER — PANTOPRAZOLE SODIUM 40 MG PO TBEC
40.0000 mg | DELAYED_RELEASE_TABLET | Freq: Every day | ORAL | Status: DC
  Administered 2014-11-26 – 2014-11-27 (×2): 40 mg via ORAL
  Filled 2014-11-26 (×2): qty 1

## 2014-11-26 MED ORDER — VERAPAMIL HCL ER 180 MG PO TBCR
180.0000 mg | EXTENDED_RELEASE_TABLET | Freq: Every day | ORAL | Status: DC
  Administered 2014-11-26: 180 mg via ORAL
  Filled 2014-11-26 (×2): qty 1

## 2014-11-26 MED ORDER — INSULIN ASPART 100 UNIT/ML ~~LOC~~ SOLN
0.0000 [IU] | Freq: Three times a day (TID) | SUBCUTANEOUS | Status: DC
  Administered 2014-11-26 – 2014-11-27 (×2): 2 [IU] via SUBCUTANEOUS

## 2014-11-26 MED ORDER — TRIMETHOPRIM 100 MG PO TABS
100.0000 mg | ORAL_TABLET | Freq: Every day | ORAL | Status: DC
  Administered 2014-11-27: 100 mg via ORAL
  Filled 2014-11-26: qty 1

## 2014-11-26 MED ORDER — ONDANSETRON HCL 4 MG/2ML IJ SOLN: 4.0000 mg | Freq: Four times a day (QID) | INTRAMUSCULAR | Status: DC | PRN

## 2014-11-26 MED ORDER — ASPIRIN EC 325 MG PO TBEC
325.0000 mg | DELAYED_RELEASE_TABLET | Freq: Every day | ORAL | Status: DC
  Administered 2014-11-27: 325 mg via ORAL
  Filled 2014-11-26: qty 1

## 2014-11-26 MED ORDER — METOPROLOL TARTRATE 25 MG PO TABS
25.0000 mg | ORAL_TABLET | Freq: Two times a day (BID) | ORAL | Status: DC
  Administered 2014-11-26: 25 mg via ORAL
  Filled 2014-11-26 (×2): qty 1

## 2014-11-26 MED ORDER — GI COCKTAIL ~~LOC~~
30.0000 mL | Freq: Four times a day (QID) | ORAL | Status: DC | PRN
  Filled 2014-11-26: qty 30

## 2014-11-26 NOTE — ED Notes (Signed)
MD at bedside. 

## 2014-11-26 NOTE — ED Notes (Signed)
[facility] has a truck coming on and will be here around 11:15 or 11:30.

## 2014-11-26 NOTE — ED Notes (Signed)
[facility] here at this time. 

## 2014-11-26 NOTE — ED Notes (Signed)
Report called to Juliann Pulse, RN at [facility].  Pt awaiting transport at this time.

## 2014-11-26 NOTE — ED Notes (Signed)
Report called to [facility] with [place]. ETA of 25 minutes.

## 2014-11-26 NOTE — ED Notes (Signed)
CP starting this morning at 0500. Reports left sided pain radiating to right collar bone. Also feels pain in the back. Reports taking 1 NTG and 325 ASA. Denies pain, just feels "tired

## 2014-11-26 NOTE — ED Provider Notes (Signed)
TIME SEEN: 8:30 AM  CHIEF COMPLAINT: Chest pain  HPI: Patient is a 69 year old female with history of hypertension, hyperlipidemia, diabetes who presents to the emergency department with complaints of substernal chest pressure that started at 5 AM this morning and radiated into her right shoulder and neck and into her back. Reports taking one nitroglycerin at home with complete resolution of pain. Also took 325 mg of aspirin. Denies shortness of breath but did have fatigue, generalized weakness, diaphoresis. States she still feels very tired. Denies any nausea, vomiting, dizziness. Denies a history of prior MI or cardiac catheterization. Reports her last stress test was approximate 3 years ago. PCP is [name] with [facility]. She does not yet have a cardiologist but did have a episode of chest pain 3 weeks ago because of this has scheduled an appointment with Dr. Julianne Handler in March.  Denies a history of PE or DVT. No lower extremity swelling or pain. No fever or cough.  ROS: See HPI Constitutional: no fever  Eyes: no drainage  ENT: no runny nose   Cardiovascular:  chest pain  Resp: no SOB  GI: no vomiting GU: no dysuria Integumentary: no rash  Allergy: no hives  Musculoskeletal: no leg swelling  Neurological: no slurred speech ROS otherwise negative  PAST MEDICAL HISTORY/PAST SURGICAL HISTORY:  Past Medical History  Diagnosis Date  . HTN (hypertension)   . Hyperglycemia   . GERD (gastroesophageal reflux disease)   . Meningioma   . Headache(784.0)   . Hyperlipidemia   . Borderline abnormal TFTs     as a teen  . Nephrolithiasis     Dr Amalia Hailey, [facility], Hx of [3][  . HSV-1 infection   . Polyp of colon   . E. coli UTI 10/05/12    [facility]; resistant only to Septra DS & tetracycline  . Radiculopathy   . Diabetes mellitus without complication     MEDICATIONS:  Prior to Admission medications   Medication Sig Start Date End Date Taking? Authorizing Provider  aspirin 325 MG EC  tablet Take 325 mg by mouth daily.    Historical Provider, MD  benzocaine-Menthol (DERMOPLAST) 20-0.5 % AERO Apply 1 application topically as needed for irritation (Applt to Affected Area As Needed for ShinglesPain). 05/14/14   Brunetta Jeans, PA-C  cyclobenzaprine (FLEXERIL) 5 MG tablet 1-2 tablets at bedtime as needed 08/15/13   Midge Minium, MD  glucose blood (FREESTYLE LITE) test strip CHECK BLOOD SUGAR ONCE DAILY AS DIRECTED.  DX:790.29 01/09/14   Hendricks Limes, MD  hydrocortisone-pramoxine [facility]) rectal foam Place 1 applicator rectally 2 (two) times daily as needed. 08/06/12   Hendricks Limes, MD  Lancets (FREESTYLE) lancets Check blood sugar once daily as directed DX:790.29 (FREESTYLE FREEDOM LITE) 01/09/14   Hendricks Limes, MD  metFORMIN (GLUCOPHAGE) 500 MG tablet 1 daily at the completion of largest meal 08/17/14   Hendricks Limes, MD  mirabegron ER (MYRBETRIQ) 25 MG TB24 tablet Take 25 mg by mouth daily.    Historical Provider, MD  montelukast (SINGULAIR) 10 MG tablet Take 1 tablet (10 mg total) by mouth at bedtime. 03/19/14   Hendricks Limes, MD  Multiple Vitamin (MULTIVITAMIN) tablet Take 1 tablet by mouth daily.      Historical Provider, MD  NONFORMULARY OR COMPOUNDED ITEM Estradiol .02% 1 ML Prefilled Applicator Sig: apply vaginally twice a week #90 Day Supply with 4 refills 11/05/14   Terrance Mass, MD  omeprazole (PRILOSEC) 20 MG capsule Take 20  mg by mouth daily.    Historical Provider, MD  trimethoprim (TRIMPEX) 100 MG tablet Take 100 mg by mouth daily.    Historical Provider, MD  valACYclovir (VALTREX) 1000 MG tablet Take 1 tablet (1,000 mg total) by mouth 3 (three) times daily. 11/05/14   Terrance Mass, MD  verapamil (VERELAN PM) 180 MG 24 hr capsule Take 180 mg by mouth at bedtime.    Historical Provider, MD    ALLERGIES:  Allergies  Allergen Reactions  . Codeine     Hives Because of a history of documented adverse serious drug reaction;Medi Alert  bracelet  is recommended  . Propoxyphene Hcl     rash  . Latex     rash  . Other     Pine nuts & walnuts throat congestion. No documented angioedema.  . Ciprofloxacin     ? Nausea (Note: she takes this when on mission trips)  . Tramadol     ? nausea    SOCIAL HISTORY:  History  Substance Use Topics  . Smoking status: Never Smoker   . Smokeless tobacco: Never Used  . Alcohol Use: No    FAMILY HISTORY: Family History  Problem Relation Age of Onset  . Alcohol abuse Mother   . Diabetes Maternal Aunt   . Diabetes Maternal Uncle   . Diabetes Maternal Grandmother   . Tuberculosis Maternal Uncle   . Other Father     unknown  . Coronary artery disease Neg Hx     EXAM: BP 172/84 mmHg  Pulse 88  Temp(Src) 98.1 F (36.7 C) (Oral)  Resp 16  Ht 5\' 5"  (1.651 m)  Wt 172 lb (78.019 kg)  BMI 28.62 kg/m2  SpO2 97%  LMP 07/30/1990 CONSTITUTIONAL: Alert and oriented and responds appropriately to questions. Well-appearing; well-nourished HEAD: Normocephalic EYES: Conjunctivae clear, PERRL ENT: normal nose; no rhinorrhea; moist mucous membranes; pharynx without lesions noted NECK: Supple, no meningismus, no LAD  CARD: RRR; S1 and S2 appreciated; no murmurs, no clicks, no rubs, no gallops RESP: Normal chest excursion without splinting or tachypnea; breath sounds clear and equal bilaterally; no wheezes, no rhonchi, no rales, chest wall nontender to palpation ABD/GI: Normal bowel sounds; non-distended; soft, non-tender, no rebound, no guarding, negative Murphy sign BACK:  The back appears normal and is non-tender to palpation, there is no CVA tenderness EXT: Normal ROM in all joints; non-tender to palpation; no edema; normal capillary refill; no cyanosis, no Tenderness or swelling    SKIN: Normal color for age and race; warm NEURO: Moves all extremities equally, normal gait, no facial droop or slurred speech PSYCH: The patient's mood and manner are appropriate. Grooming and personal  hygiene are appropriate.  MEDICAL DECISION MAKING: Patient here with episode of chest pain, diaphoresis. She has multiple risk factors for ACS. Is currently chest pain-free after nitroglycerin. EKG nonischemic. We'll obtain cardiac labs, chest x-ray. Have recommended admission for ACS rule out.  ED PROGRESS: Patient's troponin is negative. Labs otherwise unremarkable. Chest x-ray clear. Still pain-free and hemodynamically stable. We'll discuss with hospitalist at [facility] for admission for ACS rule out.      EKG Interpretation  Date/Time:  Thursday November 26 2014 08:26:38 EST Ventricular Rate:  88 PR Interval:  146 QRS Duration: 70 QT Interval:  354 QTC Calculation: 428 R Axis:   75 Text Interpretation:  Normal sinus rhythm Normal ECG Confirmed by WARD,  DO, KRISTEN ([id number]) on 11/26/2014 8:29:25 AM  [place], DO 11/26/14 1542

## 2014-11-26 NOTE — H&P (Signed)
[facility] History and Physical  SHACARRA CHOE NFA:[id number] DOB: 02-12-1946 DOA: 11/26/2014  Referring physician: Dr. Erasmo Downer Ward PCP: Irven Shelling, MD   Chief Complaint:  Chest pain at rest  HPI:  69 year old female with history of hypertension (not on any medications), diabetes mellitus on metformin, hyperlipidemia not on any medications, GERD, headaches, recurrent UTIs, who presented to [facility] ED with acute onset of left-sided chest pain. Patient reports waking up at 5 in the morning with left sided chest pain which was pressure-like 5/10 in severity and then radiated towards the right clavicle .she reports the pain lasted for several minutes and took a full dose aspirin followed by 1 dose sublingual nitrate after which the pain improved to 3/10. Patient then went to the ED where shortly her pain completely subsided. Patient denies any shortness of breath or palpitations. She reports having similar chest pain symptoms on 2 occasions in the past few months which were of less intensity and subsided on its own. She has history of GERD but her symptoms are not similar to current chest pain.  She reports being on aspirin but takes them irregularly. -We recent chest pain symptoms her PCP referred her to cardiology Dr. Julianne Handler as outpatient and has an appointment with him in March. She reports having a stress test done about 3 years back and thinks it was normal but does not remember weight was done. Patient denies headache, dizziness, fever, chills, nausea , vomiting,  palpitations, SOB, abdominal pain, bowel or urinary symptoms. Denies change in weight or appetite. At baseline patient reports to be fully active. Denies any recent chest trauma or lifting any heavy weight. Denies recent travel or illness.  Course in the ED Patient's vitals were stable. Blood will done showed normal WBC, hemoglobin and platelets. Chemistry was unremarkable. Initial troponin was negative.  Chest x-ray was unremarkable. EKG showed normal sinus rhythm without any ST-T changes. Hospitalist admission requested on observation for  ACS rule out.  Review of Systems:  Constitutional: Denies fever, chills, diaphoresis, appetite change and fatigue.  HEENT: Denies visual or hearing symptoms, congestion, sore throat, rhinorrhea, trouble swallowing neck pain, neck stiffness  Respiratory: Denies SOB, DOE, cough, chest tightness,  and wheezing.   Cardiovascular:  chest pain++, denies palpitations and leg swelling.  Gastrointestinal: Denies nausea, vomiting, abdominal pain, diarrhea, , blood in stool and abdominal distention.  Genitourinary: Denies dysuria, hematuria, flank pain and difficulty urinating.  Endocrine: Denies: hot or cold intolerance, polyuria, polydipsia. Musculoskeletal: Denies myalgias, back pain, joint swelling, arthralgias and gait problem.  Skin: Denies pallor, rash and wound.  Neurological: Denies dizziness, syncope, weakness, light-headedness, numbness and headaches.     Past Medical History  Diagnosis Date  . HTN (hypertension)   . Hyperglycemia   . GERD (gastroesophageal reflux disease)   . Meningioma   . Headache(784.0)   . Hyperlipidemia   . Borderline abnormal TFTs     as a teen  . Nephrolithiasis     Dr Amalia Hailey, [facility], Hx of [3][  . HSV-1 infection   . Polyp of colon   . E. coli UTI 10/05/12    [facility]; resistant only to Septra DS & tetracycline  . Radiculopathy   . Diabetes mellitus without complication    Past Surgical History  Procedure Laterality Date  . Carpal tunnel release      & trigger thumb RUE; Dr Alphonzo Cruise  . Rotator cuff repair      R shoulder  . Tonsillectomy    .  Knee arthroscopy      Left  . Breast biopsy      Right  . Colonoscopy w/ polypectomy  07/2004    Neg 2011; Dr Earle Gell  . Cystoscopy  11/2009    Neg  . Abdominal hysterectomy      TAH/BSO --fibroids, no cancer  . Flexible bronchoscopy w/ upper endoscopy      neg   . Esophagogastroduodenoscopy N/A 07/20/2014    Procedure: ESOPHAGOGASTRODUODENOSCOPY (EGD);  Surgeon: Garlan Fair, MD;  Location: [facility];  Service: Endoscopy;  Laterality: N/A;  . Balloon dilation N/A 07/20/2014    Procedure: BALLOON DILATION;  Surgeon: Garlan Fair, MD;  Location: [facility];  Service: Endoscopy;  Laterality: N/A;   Social History:  reports that she has never smoked. She has never used smokeless tobacco. She reports that she does not drink alcohol or use illicit drugs.  Allergies  Allergen Reactions  . Codeine     Hives Because of a history of documented adverse serious drug reaction;Medi Alert bracelet  is recommended  . Propoxyphene Hcl     rash  . Latex     rash  . Other     Pine nuts & walnuts throat congestion. No documented angioedema.  . Ciprofloxacin     ? Nausea (Note: she takes this when on mission trips)  . Tramadol     ? nausea    Family History  Problem Relation Age of Onset  . Alcohol abuse Mother   . Diabetes Maternal Aunt   . Diabetes Maternal Uncle   . Diabetes Maternal Grandmother   . Tuberculosis Maternal Uncle   . Other Father     unknown  . Coronary artery disease Neg Hx     Prior to Admission medications   Medication Sig Start Date End Date Taking? Authorizing Provider  aspirin 325 MG EC tablet Take 325 mg by mouth daily.    Historical Provider, MD  benzocaine-Menthol (DERMOPLAST) 20-0.5 % AERO Apply 1 application topically as needed for irritation (Applt to Affected Area As Needed for ShinglesPain). 05/14/14   Brunetta Jeans, PA-C  cyclobenzaprine (FLEXERIL) 5 MG tablet 1-2 tablets at bedtime as needed 08/15/13   Midge Minium, MD  glucose blood (FREESTYLE LITE) test strip CHECK BLOOD SUGAR ONCE DAILY AS DIRECTED.  DX:790.29 01/09/14   Hendricks Limes, MD  hydrocortisone-pramoxine [facility]) rectal foam Place 1 applicator rectally 2 (two) times daily as needed. 08/06/12   Hendricks Limes, MD  Lancets  (FREESTYLE) lancets Check blood sugar once daily as directed DX:790.29 (FREESTYLE FREEDOM LITE) 01/09/14   Hendricks Limes, MD  metFORMIN (GLUCOPHAGE) 500 MG tablet 1 daily at the completion of largest meal 08/17/14   Hendricks Limes, MD  mirabegron ER (MYRBETRIQ) 25 MG TB24 tablet Take 25 mg by mouth daily.    Historical Provider, MD  montelukast (SINGULAIR) 10 MG tablet Take 1 tablet (10 mg total) by mouth at bedtime. 03/19/14   Hendricks Limes, MD  Multiple Vitamin (MULTIVITAMIN) tablet Take 1 tablet by mouth daily.      Historical Provider, MD  NONFORMULARY OR COMPOUNDED ITEM Estradiol .02% 1 ML Prefilled Applicator Sig: apply vaginally twice a week #90 Day Supply with 4 refills 11/05/14   Terrance Mass, MD  omeprazole (PRILOSEC) 20 MG capsule Take 20 mg by mouth daily.    Historical Provider, MD  trimethoprim (TRIMPEX) 100 MG tablet Take 100 mg by mouth daily.    Historical Provider, MD  valACYclovir (VALTREX) 1000 MG tablet Take 1 tablet (1,000 mg total) by mouth 3 (three) times daily. 11/05/14   Terrance Mass, MD  verapamil (VERELAN PM) 180 MG 24 hr capsule Take 180 mg by mouth at bedtime.    Historical Provider, MD     Physical Exam:  Filed Vitals:   11/26/14 1030 11/26/14 1100 11/26/14 1111 11/26/14 1244  BP: 156/76 157/60 157/60 154/74  Pulse: 90 93 90 78  Temp:   98.3 F (36.8 C) 98.3 F (36.8 C)  TempSrc:    Oral  Resp: 11 12 12 18   Height:      Weight:    78.7 kg (173 lb 8 oz)  SpO2: 96% 97% 98%     Constitutional: Vital signs reviewed. Elderly female lying in bed in no acute distress HEENT: no pallor, no icterus, moist oral mucosa, no cervical lymphadenopathy Cardiovascular: RRR, S1 normal, S2 normal, no MRG, no reproducible pain. Chest: CTAB, no wheezes, rales, or rhonchi Abdominal: Soft. Non-tender, non-distended, bowel sounds are normal. Ext: warm, no edema Neurological: Alert and oriented  Labs on Admission:  Basic Metabolic Panel:  Recent Labs Lab  11/26/14 0830  NA 136  K 3.9  CL 106  CO2 27  GLUCOSE 125*  BUN 18  CREATININE 0.71  CALCIUM 9.2   Liver Function Tests: No results for input(s): AST, ALT, ALKPHOS, BILITOT, PROT, ALBUMIN in the last 168 hours. No results for input(s): LIPASE, AMYLASE in the last 168 hours. No results for input(s): AMMONIA in the last 168 hours. CBC:  Recent Labs Lab 11/26/14 0830  WBC 8.1  NEUTROABS 4.5  HGB 13.0  HCT 39.0  MCV 87.6  PLT 238   Cardiac Enzymes:  Recent Labs Lab 11/26/14 0830  TROPONINI <0.03   BNP: Invalid input(s): POCBNP CBG: No results for input(s): GLUCAP in the last 168 hours.  Radiological Exams on Admission: Dg Chest 2 View  11/26/2014   CLINICAL DATA:  Left-sided chest pain  EXAM: CHEST  2 VIEW  COMPARISON:  Mar 19, 2014  FINDINGS: Lungs are clear. Heart size and pulmonary vascularity are normal. No adenopathy. No bone lesions.  IMPRESSION: No edema or consolidation.   Electronically Signed   By: Lowella Grip M.D.   On: 11/26/2014 08:59    EKG: Normal Sinus rhythm, no ST-T changes  Assessment/Plan    Principal Problem: Chest pain at rest Patient has both typical and atypical component. Heart score of 3. Admit under observation on telemetry. Rule out ACS with serial troponin and EKG. -Check 2-D echo to evaluate EF and rule out wall motion abnormality. Check lipid panel in a.m. -Continue full dose aspirin, sublingual nitrate. Add metoprolol 25 mg twice a day. -Patient has appointment with Dr. Merleen Nicely in 6 weeks. Will ask cardiology to evaluate. May benefit from stress test as inpatient.     Diabetes mellitus Hold metformin. Check A1C, monitor fsg. continue SSI  GERD Continue PPI   Continue remaining home medications. Pt reports se is not taking valacyclovir anymore. Takes Singulair and Flexeril only as needed.    Diet:cardiac/ diabetic  DVT prophylaxis: sq lovenox   Code Status: full code Family Communication: / None at  bedside Disposition Plan: home once w/up completed ( possibly in next 24 hrs)  ,  [facility] Pager [phone]  Total time spent on admission :50 minutes  If 7PM-7AM, please contact night-coverage [email or web address] Password [email or web address] 11/26/2014, 1:12 PM

## 2014-11-26 NOTE — ED Notes (Signed)
  [facility] has been notified of room 2W36C at [facility]--

## 2014-11-26 NOTE — Consult Note (Signed)
Reason for Consult: chest pain at rest  Referring Physician: Dr. Clementeen Graham   PCP:  Irven Shelling, MD  Primary Cardiologist:was to see Dr. Angelena Form appt 12/30/14  Kristen Murray is an 69 y.o. female.    Chief Complaint: chest pain   HPI: 69  Year old female with hx of dx of DM2 6 months ago, HTN and neg. Stress test in 2013 presented today with chest pain.  Lt sided pain with radiation to rt clavicle. Somewhat sharp, felt like it swas wedged between chest and back.  No nausea, SOB or diaphoresis.  She took ASA and then one of her husband's NTG, she belched.  Eventually it improved and probably lasted several hours, but improved than it had been.  No increase of pain with movement.  Normally no chest pain with activity, some DOE, more with strenuous activity.      No colds, fevers, chills.  No syncope.    EKG normal, troponin neg with first.  She was to have seen Dr. Angelena Form in March.   Past Medical History  Diagnosis Date  . HTN (hypertension)   . Hyperglycemia   . GERD (gastroesophageal reflux disease)   . Meningioma   . Headache(784.0)   . Hyperlipidemia   . Borderline abnormal TFTs     as a teen  . Nephrolithiasis     Dr Amalia Hailey, [facility], Hx of [3][  . HSV-1 infection   . Polyp of colon   . E. coli UTI 10/05/12    [facility]; resistant only to Septra DS & tetracycline  . Radiculopathy   . Diabetes mellitus without complication     Past Surgical History  Procedure Laterality Date  . Carpal tunnel release      & trigger thumb RUE; Dr Alphonzo Cruise  . Rotator cuff repair      R shoulder  . Tonsillectomy    . Knee arthroscopy      Left  . Breast biopsy      Right  . Colonoscopy w/ polypectomy  07/2004    Neg 2011; Dr Earle Gell  . Cystoscopy  11/2009    Neg  . Abdominal hysterectomy      TAH/BSO --fibroids, no cancer  . Flexible bronchoscopy w/ upper endoscopy      neg  . Esophagogastroduodenoscopy N/A 07/20/2014    Procedure: ESOPHAGOGASTRODUODENOSCOPY  (EGD);  Surgeon: Garlan Fair, MD;  Location: [facility];  Service: Endoscopy;  Laterality: N/A;  . Balloon dilation N/A 07/20/2014    Procedure: BALLOON DILATION;  Surgeon: Garlan Fair, MD;  Location: [facility];  Service: Endoscopy;  Laterality: N/A;    Family History  Problem Relation Age of Onset  . Alcohol abuse Mother   . Diabetes Maternal Aunt   . Diabetes Maternal Uncle   . Diabetes Maternal Grandmother   . Tuberculosis Maternal Uncle   . Other Father     unknown  . Coronary artery disease Neg Hx    Social History:  reports that she has never smoked. She has never used smokeless tobacco. She reports that she does not drink alcohol or use illicit drugs.  Married her husband here for exam.  Allergies:  Allergies  Allergen Reactions  . Codeine     Hives Because of a history of documented adverse serious drug reaction;Medi Alert bracelet  is recommended  . Propoxyphene Hcl     rash  . Latex     rash  . Other  Pine nuts & walnuts throat congestion. No documented angioedema.  . Ciprofloxacin     ? Nausea (Note: she takes this when on mission trips)  . Tramadol     ? nausea    Medications Prior to Admission  Medication Sig Dispense Refill  . aspirin 325 MG EC tablet Take 325 mg by mouth daily.    Marland Kitchen glucose blood (FREESTYLE LITE) test strip CHECK BLOOD SUGAR ONCE DAILY AS DIRECTED.  DX:790.29 100 each 12  . Lancets (FREESTYLE) lancets Check blood sugar once daily as directed DX:790.29 (FREESTYLE FREEDOM LITE) 100 each 1  . metFORMIN (GLUCOPHAGE) 500 MG tablet 1 daily at the completion of largest meal (Patient taking differently: 250 mg. 1 daily at the completion of largest meal) 90 tablet 1  . mirabegron ER (MYRBETRIQ) 25 MG TB24 tablet Take 25 mg by mouth daily.    . Multiple Vitamin (MULTIVITAMIN) tablet Take 1 tablet by mouth daily.      . naproxen (NAPROSYN) 500 MG tablet Take 500 mg by mouth 2 (two) times daily with a meal.    . NONFORMULARY OR  COMPOUNDED ITEM Estradiol .02% 1 ML Prefilled Applicator Sig: apply vaginally twice a week #90 Day Supply with 4 refills 1 each 4  . omeprazole (PRILOSEC) 20 MG capsule Take 20 mg by mouth daily.    Marland Kitchen trimethoprim (TRIMPEX) 100 MG tablet Take 100 mg by mouth daily.    . verapamil (VERELAN PM) 180 MG 24 hr capsule Take 180 mg by mouth at bedtime.    . valACYclovir (VALTREX) 1000 MG tablet Take 1 tablet (1,000 mg total) by mouth 3 (three) times daily. (Patient not taking: Reported on 11/26/2014) 21 tablet 3    Results for orders placed or performed during the hospital encounter of 11/26/14 (from the past 48 hour(s))  CBC with Differential     Status: None   Collection Time: 11/26/14  8:30 AM  Result Value Ref Range   WBC 8.1 4.0 - 10.5 K/uL   RBC 4.45 3.87 - 5.11 MIL/uL   Hemoglobin 13.0 12.0 - 15.0 g/dL   HCT 39.0 36.0 - 46.0 %   MCV 87.6 78.0 - 100.0 fL   MCH 29.2 26.0 - 34.0 pg   MCHC 33.3 30.0 - 36.0 g/dL   RDW 13.2 11.5 - 15.5 %   Platelets 238 150 - 400 K/uL   Neutrophils Relative % 56 43 - 77 %   Neutro Abs 4.5 1.7 - 7.7 K/uL   Lymphocytes Relative 34 12 - 46 %   Lymphs Abs 2.7 0.7 - 4.0 K/uL   Monocytes Relative 8 3 - 12 %   Monocytes Absolute 0.7 0.1 - 1.0 K/uL   Eosinophils Relative 2 0 - 5 %   Eosinophils Absolute 0.2 0.0 - 0.7 K/uL   Basophils Relative 0 0 - 1 %   Basophils Absolute 0.0 0.0 - 0.1 K/uL  Basic metabolic panel     Status: Abnormal   Collection Time: 11/26/14  8:30 AM  Result Value Ref Range   Sodium 136 135 - 145 mmol/L    Comment: Please note change in reference range.   Potassium 3.9 3.5 - 5.1 mmol/L    Comment: Please note change in reference range.   Chloride 106 96 - 112 mmol/L   CO2 27 19 - 32 mmol/L   Glucose, Bld 125 (H) 70 - 99 mg/dL   BUN 18 6 - 23 mg/dL   Creatinine, Ser 0.71 0.50 - 1.10 mg/dL  Calcium 9.2 8.4 - 10.5 mg/dL   GFR calc non Af Amer 86 (L) >90 mL/min   GFR calc Af Amer >90 >90 mL/min    Comment: (NOTE) The eGFR has been  calculated using the CKD EPI equation. This calculation has not been validated in all clinical situations. eGFR's persistently <90 mL/min signify possible Chronic Kidney Disease.    Anion gap 3 (L) 5 - 15  Troponin I     Status: None   Collection Time: 11/26/14  8:30 AM  Result Value Ref Range   Troponin I <0.03 <0.031 ng/mL    Comment:        NO INDICATION OF MYOCARDIAL INJURY.    Dg Chest 2 View  11/26/2014   CLINICAL DATA:  Left-sided chest pain  EXAM: CHEST  2 VIEW  COMPARISON:  Mar 19, 2014  FINDINGS: Lungs are clear. Heart size and pulmonary vascularity are normal. No adenopathy. No bone lesions.  IMPRESSION: No edema or consolidation.   Electronically Signed   By: Lowella Grip M.D.   On: 11/26/2014 08:59    ZYY:QMGNOIB:BC colds or fevers, no weight changes Skin:no rashes or ulcers HEENT:no blurred vision, no congestion CV:see HPI PUL:see HPI GI:no diarrhea constipation or melena, no indigestion GU:no hematuria, no dysuria MS:no joint pain, no claudication Neuro:no syncope, no lightheadedness Endo:+ diabetes for 6 months, some thyroid disease as child she is not clear what   Blood pressure 154/74, pulse 78, temperature 98.3 F (36.8 C), temperature source Oral, resp. rate 18, height $RemoveBe'5\' 5"'hTMaTdqHy$  (1.651 m), weight 173 lb 8 oz (78.7 kg), last menstrual period 07/30/1990, SpO2 98 %.  Wt Readings from Last 3 Encounters:  11/26/14 173 lb 8 oz (78.7 kg)  11/05/14 176 lb (79.833 kg)  10/02/14 176 lb 3.2 oz (79.924 kg)    PE: General:Pleasant affect, NAD Skin:Warm and dry, brisk capillary refill HEENT:normocephalic, sclera clear, mucus membranes moist Neck:supple, no JVD, no bruits, no adenopathy  Heart:S1S2 RRR without murmur, gallup, rub or click Lungs:clear without rales, rhonchi, or wheezes WUG:QBVQ, non tender, + BS, do not palpate liver spleen or masses Ext:no lower ext edema, 2+ pedal pulses, 2+ radial pulses Neuro:alert and oriented, MAE, follows commands, + facial  symmetry    Assessment/Plan Principal Problem:   Chest pain at rest- initial troponin negative.  Plan exercise myoview for tomorrow.   Active Problems:   GERD   Type 2 diabetes mellitus with neurological manifestations, controlled   Hyperlipidemia  Last LDL 122 in 06/2014 would need statin.   [place]  Nurse Practitioner Certified [place] Pager [phone] or after 5pm or weekends call [phone] 11/26/2014, 2:52 PM  As above, patient seen and examined. Briefly she is a 69 year old female with a past medical history of diabetes mellitus and hypertension for evaluation of chest pain. Patient developed chest pain early this morning to the left of her sternum. It radiated to her clavicle. No associated symptoms. Not pleuritic, positional or related to food. Some improvement with nitroglycerin and belching. Total duration approximately 2 hours. She otherwise does not have exertional chest pain. No orthopnea, PND, pedal edema or syncope. Mild dyspnea on exertion. Cardiology asked to evaluate. Electrocardiogram shows sinus rhythm with no ST changes. Initial troponin negative. Would continue to rule out myocardial infarction with serial enzymes. If negative would risk stratify with stress nuclear study. Kirk Ruths

## 2014-11-27 ENCOUNTER — Observation Stay (HOSPITAL_COMMUNITY): Payer: Medicare Other

## 2014-11-27 DIAGNOSIS — R072 Precordial pain: Secondary | ICD-10-CM

## 2014-11-27 DIAGNOSIS — R079 Chest pain, unspecified: Secondary | ICD-10-CM | POA: Diagnosis not present

## 2014-11-27 LAB — TROPONIN I

## 2014-11-27 LAB — LIPID PANEL
CHOL/HDL RATIO: 3.5 ratio
CHOLESTEROL: 183 mg/dL (ref 0–200)
HDL: 53 mg/dL (ref >39)
LDL CALC: 114 mg/dL — AB (ref 0–99)
TRIGLYCERIDES: 78 mg/dL (ref <150)
VLDL: 16 mg/dL (ref 0–40)

## 2014-11-27 LAB — GLUCOSE, CAPILLARY
Glucose-Capillary: 104 mg/dL — ABNORMAL HIGH (ref 70–99)
Glucose-Capillary: 146 mg/dL — ABNORMAL HIGH (ref 70–99)
Glucose-Capillary: 108 mg/dL — ABNORMAL HIGH (ref 70–99)

## 2014-11-27 MED ORDER — TECHNETIUM TC 99M SESTAMIBI GENERIC - CARDIOLITE
10.0000 | Freq: Once | INTRAVENOUS | Status: AC | PRN
  Administered 2014-11-27: 10 via INTRAVENOUS

## 2014-11-27 MED ORDER — TECHNETIUM TC 99M SESTAMIBI GENERIC - CARDIOLITE
30.0000 | Freq: Once | INTRAVENOUS | Status: AC | PRN
  Administered 2014-11-27: 30 via INTRAVENOUS

## 2014-11-27 NOTE — Progress Notes (Signed)
11/27/2014 4:57 PM Discharge AVS meds taken today and those due this evening reviewed.  Follow-up appointments and when to call md reviewed.  D/C IV and TELE.  Questions and concerns addressed.   D/C home per orders. Carney Corners

## 2014-11-27 NOTE — Discharge Instructions (Signed)

## 2014-11-27 NOTE — Progress Notes (Signed)
UR completed 

## 2014-11-27 NOTE — Progress Notes (Signed)
   Patient Name: BEATA BEASON Date of Encounter: 11/27/2014     Principal Problem:   Chest pain at rest Active Problems:   GERD   Type 2 diabetes mellitus with neurological manifestations, controlled    SUBJECTIVE  No CP or SOB. Feeling well.  CURRENT MEDS . aspirin EC  325 mg Oral Daily  . enoxaparin (LOVENOX) injection  40 mg Subcutaneous Q24H  . insulin aspart  0-15 Units Subcutaneous TID WC  . insulin aspart  0-5 Units Subcutaneous QHS  . mirabegron ER  25 mg Oral Daily  . multivitamin with minerals  1 tablet Oral Daily  . pantoprazole  40 mg Oral Daily  . trimethoprim  100 mg Oral Daily  . verapamil  180 mg Oral QHS    OBJECTIVE  Filed Vitals:   11/26/14 1111 11/26/14 1244 11/26/14 2054 11/27/14 0437  BP: 157/60 154/74 159/79 134/66  Pulse: 90 78 72 91  Temp: 98.3 F (36.8 C) 98.3 F (36.8 C) 97.8 F (36.6 C) 97.8 F (36.6 C)  TempSrc:  Oral Oral Oral  Resp: 12 18 18 18   Height:      Weight:  173 lb 8 oz (78.7 kg)    SpO2: 98%  96% 97%   No intake or output data in the 24 hours ending 11/27/14 0903 Filed Weights   11/26/14 0826 11/26/14 1244  Weight: 172 lb (78.019 kg) 173 lb 8 oz (78.7 kg)    PHYSICAL EXAM  General: Pleasant, NAD. Neuro: Alert and oriented X 3. Moves all extremities spontaneously. Psych: Normal affect. HEENT:  Normal  Neck: Supple without bruits or JVD. Lungs:  Resp regular and unlabored, CTA. Heart: RRR no s3, s4, or murmurs. Abdomen: Soft, non-tender, non-distended, BS + x 4.  Extremities: No clubbing, cyanosis or edema. DP/PT/Radials 2+ and equal bilaterally.  Accessory Clinical Findings  CBC  Recent Labs  11/26/14 0830  WBC 8.1  NEUTROABS 4.5  HGB 13.0  HCT 39.0  MCV 87.6  PLT 300   Basic Metabolic Panel  Recent Labs  11/26/14 0830  NA 136  K 3.9  CL 106  CO2 27  GLUCOSE 125*  BUN 18  CREATININE 0.71  CALCIUM 9.2   Cardiac Enzymes  Recent Labs  11/26/14 1400 11/26/14 1936 11/27/14 0050    TROPONINI <0.03 <0.03 <0.03   Fasting Lipid Panel  Recent Labs  11/27/14 0050  CHOL 183  HDL 53  LDLCALC 114*  TRIG 78  CHOLHDL 3.5    TELE  Not on tele because going down to stress test Radiology/Studies  Dg Chest 2 View  11/26/2014   CLINICAL DATA:  Left-sided chest pain  EXAM: CHEST  2 VIEW  COMPARISON:  Mar 19, 2014  FINDINGS: Lungs are clear. Heart size and pulmonary vascularity are normal. No adenopathy. No bone lesions.  IMPRESSION: No edema or consolidation.   Electronically Signed   By: Lowella Grip M.D.   On: 11/26/2014 08:59    ASSESSMENT AND PLAN  69 year old female with a past medical history of diabetes mellitus, GERD and hypertension who [facility] to [facility] on [facility]14 for evaluation of chest pain.  Chest pain- no further chest pain. -- Troponin neg x3. ECG with no acute ST or TW changes -- Plan for ETT myoview today. If test normal she can likely be discharged home  HLD- LDL 114. Cont statin  DM- cont home regimen    Signed, Eileen Stanford PA-C  Pager [phone]

## 2014-11-27 NOTE — Progress Notes (Signed)
  Echocardiogram 2D Echocardiogram has been performed.  Kristen Murray 11/27/2014, 12:35 PM

## 2014-11-27 NOTE — Discharge Summary (Signed)
Physician Discharge Summary  Kristen Murray XLK:[id number] DOB: Dec 12, 1945 DOA: 11/26/2014  PCP: Irven Shelling, MD  Admit date: 11/26/2014 Discharge date: 11/27/2014  Time spent: 35 minutes  Recommendations for Outpatient Follow-up:  Patient will be discharged to home.  She will need to followup with her primary care physician within 1 week of discharge.  Patient should continue her medications as prescribed.  She should follow a heart healthy/carb modified diet.  She may resume activity as tolerated.  Discharge Diagnoses:  Principal Problem:   Chest pain at rest Active Problems:   GERD   Type 2 diabetes mellitus with neurological manifestations, controlled   Discharge Condition: Stable  Diet recommendation: Heart healthy/carb modified  Filed Weights   11/26/14 0826 11/26/14 1244  Weight: 78.019 kg (172 lb) 78.7 kg (173 lb 8 oz)    History of present illness:  By Dr. Louellen Molder on 11/26/2014 69 year old female with history of hypertension (not on any medications), diabetes mellitus on metformin, hyperlipidemia not on any medications, GERD, headaches, recurrent UTIs, who presented to [facility] ED with acute onset of left-sided chest pain. Patient reports waking up at 5 in the morning with left sided chest pain which was pressure-like 5/10 in severity and then radiated towards the right clavicle .she reports the pain lasted for several minutes and took a full dose aspirin followed by 1 dose sublingual nitrate after which the pain improved to 3/10. Patient then went to the ED where shortly her pain completely subsided. Patient denies any shortness of breath or palpitations. She reports having similar chest pain symptoms on 2 occasions in the past few months which were of less intensity and subsided on its own. She has history of GERD but her symptoms are not similar to current chest pain. She reports being on aspirin but takes them irregularly. -We recent chest pain  symptoms her PCP referred her to cardiology Dr. Julianne Handler as outpatient and has an appointment with him in March. She reports having a stress test done about 3 years back and thinks it was normal but does not remember weight was done. Patient denies headache, dizziness, fever, chills, nausea , vomiting, palpitations, SOB, abdominal pain, bowel or urinary symptoms. Denies change in weight or appetite. At baseline patient reports to be fully active. Denies any recent chest trauma or lifting any heavy weight. Denies recent travel or illness.  Hospital Course:  Chest pain -Resolved -Cardiology consulted and appreciated -Troponins negative, EKG without acute ST/TW changes -Patient underwent ETT myoview: Clinically and electrically negative for ischemia, normal perfusion, EF70% -Echocardiogram: EF 36-64%, grade 1 diastolic dysfunction -Continue aspirin -lipid panel: TC 183, TG 78, HDL 53, LDL 114 -Per Cardio note: if myoview negative, no need for followup with Dr. Angelena Form  Diabetes Mellitus, type 2 -Metformin held, may resume at discharge -HbA1c pending (was 6.7 07/07/2014)  Hypertension -Continue verapamil   GERD -Continue PPI  Procedures: Stress test with myoview Echocardiogram  Consultations: Cardiology  Discharge Exam: Filed Vitals:   11/27/14 1413  BP: 133/61  Pulse: 88  Temp: 97.9 F (36.6 C)  Resp:      General: Well developed, well nourished, NAD, appears stated age  HEENT: NCAT,mucous membranes moist.  Cardiovascular: S1 S2 auscultated, no rubs, murmurs or gallops. Regular rate and rhythm.  Respiratory: Clear to auscultation bilaterally with equal chest rise  Abdomen: Soft, nontender, nondistended, + bowel sounds  Extremities: warm dry without cyanosis clubbing or edema  Neuro: AAOx3, nonfocal  Psych: Normal affect and demeanor with  intact judgement and insight  Discharge Instructions      Discharge Instructions    Discharge instructions    Complete  by:  As directed   Patient will be discharged to home.  She will need to followup with her primary care physician within 1 week of discharge.  Patient should continue her medications as prescribed.  She should follow a heart healthy/carb modified diet.  She may resume activity as tolerated.            Medication List    STOP taking these medications        naproxen 500 MG tablet  Commonly known as:  NAPROSYN     valACYclovir 1000 MG tablet  Commonly known as:  VALTREX      TAKE these medications        aspirin 325 MG EC tablet  Take 325 mg by mouth daily.     freestyle lancets  Check blood sugar once daily as directed DX:790.29 (FREESTYLE FREEDOM LITE)     glucose blood test strip  Commonly known as:  FREESTYLE LITE  CHECK BLOOD SUGAR ONCE DAILY AS DIRECTED.  DX:790.29     metFORMIN 500 MG tablet  Commonly known as:  GLUCOPHAGE  1 daily at the completion of largest meal     multivitamin tablet  Take 1 tablet by mouth daily.     MYRBETRIQ 25 MG Tb24 tablet  Generic drug:  mirabegron ER  Take 25 mg by mouth daily.     NONFORMULARY OR COMPOUNDED ITEM  - Estradiol .02%  - 1 ML Prefilled Applicator  - Sig: apply vaginally twice a week  - #90 Day Supply with 4 refills     omeprazole 20 MG capsule  Commonly known as:  PRILOSEC  Take 20 mg by mouth daily.     trimethoprim 100 MG tablet  Commonly known as:  TRIMPEX  Take 100 mg by mouth daily.     verapamil 180 MG 24 hr capsule  Commonly known as:  VERELAN PM  Take 180 mg by mouth at bedtime.       Allergies  Allergen Reactions  . Codeine     Hives Because of a history of documented adverse serious drug reaction;Medi Alert bracelet  is recommended  . Propoxyphene Hcl     rash  . Latex     rash  . Other     Pine nuts & walnuts throat congestion. No documented angioedema.  . Ciprofloxacin     ? Nausea (Note: she takes this when on mission trips)  . Tramadol     ? nausea   Follow-up Information     Follow up with Irven Shelling, MD. Schedule an appointment as soon as possible for a visit in 1 week.   Specialty:  Internal Medicine   Why:  Hospital followup   Contact information:   [address], [address] [place] [address] [phone]        The results of significant diagnostics from this hospitalization (including imaging, microbiology, ancillary and laboratory) are listed below for reference.    Significant Diagnostic Studies: Dg Chest 2 View  11/26/2014   CLINICAL DATA:  Left-sided chest pain  EXAM: CHEST  2 VIEW  COMPARISON:  Mar 19, 2014  FINDINGS: Lungs are clear. Heart size and pulmonary vascularity are normal. No adenopathy. No bone lesions.  IMPRESSION: No edema or consolidation.   Electronically Signed   By: Lowella Grip M.D.   On: 11/26/2014 08:59  Nm Myocar Multi W/spect W/wall Motion / Ef  11/27/2014   CLINICAL DATA:  Patient is a 69 yo with a history of CP Test to evaluate, rule out ischemia.  EXAM: MYOCARDIAL IMAGING WITH SPECT (REST AND EXERCISE)  GATED LEFT VENTRICULAR WALL MOTION STUDY  LEFT VENTRICULAR EJECTION FRACTION  TECHNIQUE: Standard myocardial SPECT imaging was performed after resting intravenous injection of 10 mCi Tc-87m sestamibi. Subsequently, exercise tolerance test was performed by the patient under the supervision of the Cardiology staff. At peak-stress, 30 mCi Tc-57m sestamibi was injected intravenously and standard myocardial SPECT imaging was performed. Quantitative gated imaging was also performed to evaluate left ventricular wall motion, and estimate left ventricular ejection fraction.  COMPARISON:  None.  FINDINGS: Stress data: The patient exercised in the Bruce protocol Baseline EKG SR 78 bpm Baseline BP 151/69 The patient exercised 5 min 9 sec to a peak HR of 150 (99% predicted maximal)  With exercise the patient experienced no CP EKG showed no ST changes to suggest ischemia  Nuclear data: In the initial stress images there  appeared to be normal perfusion with minimal thinning in the inferolateral base. Otherwise normal perfusion In the recovery images there was overlying bowel activity in this region.  Again, review of the raw data shows bowel activity (esp prominent in the rest images) and diaphragm underlying the inferior wall .  On gating LVEF was calculated at greater than 70% with normal wall motion.  IMPRESSION: Stress myoview: Clinically and electrically negative for ischemia Myoview scan with normal perfusion and mild soft tissue attenuation. NO evid for significant ischemia or scar. LVEF 70%   Electronically Signed   By: Dorris Carnes M.D.   On: 11/27/2014 16:14    Microbiology: No results found for this or any previous visit (from the past 240 hour(s)).   Labs: Basic Metabolic Panel:  Recent Labs Lab 11/26/14 0830  NA 136  K 3.9  CL 106  CO2 27  GLUCOSE 125*  BUN 18  CREATININE 0.71  CALCIUM 9.2   Liver Function Tests: No results for input(s): AST, ALT, ALKPHOS, BILITOT, PROT, ALBUMIN in the last 168 hours. No results for input(s): LIPASE, AMYLASE in the last 168 hours. No results for input(s): AMMONIA in the last 168 hours. CBC:  Recent Labs Lab 11/26/14 0830  WBC 8.1  NEUTROABS 4.5  HGB 13.0  HCT 39.0  MCV 87.6  PLT 238   Cardiac Enzymes:  Recent Labs Lab 11/26/14 0830 11/26/14 1400 11/26/14 1936 11/27/14 0050  TROPONINI <0.03 <0.03 <0.03 <0.03   BNP: BNP (last 3 results) No results for input(s): PROBNP in the last 8760 hours. CBG:  Recent Labs Lab 11/26/14 1615 11/26/14 2109 11/27/14 0632 11/27/14 1306 11/27/14 1611  GLUCAP 131* 94 104* 146* 108*       Signed:  ,   [facility] 11/27/2014, 4:22 PM

## 2014-11-27 NOTE — Progress Notes (Signed)
Exercise stress test.  Pt tolerated well- did 3 min into stage 2 test stopped due to achieving target HR and greater.  No chest pain, no SOB.    If negative no need for appt with Dr. Angelena Form per Dr. Stanford Breed.

## 2014-11-28 LAB — HEMOGLOBIN A1C
HEMOGLOBIN A1C: 6.4 % — AB (ref 4.8–5.6)
MEAN PLASMA GLUCOSE: 137 mg/dL

## 2014-12-09 ENCOUNTER — Encounter: Payer: Self-pay | Admitting: Gynecology

## 2014-12-30 ENCOUNTER — Ambulatory Visit: Payer: Medicare Other | Admitting: Cardiovascular Disease

## 2015-02-01 ENCOUNTER — Encounter: Payer: Medicare Other | Attending: Internal Medicine

## 2015-02-01 DIAGNOSIS — E1149 Type 2 diabetes mellitus with other diabetic neurological complication: Secondary | ICD-10-CM

## 2015-02-01 DIAGNOSIS — Z713 Dietary counseling and surveillance: Secondary | ICD-10-CM | POA: Insufficient documentation

## 2015-02-01 DIAGNOSIS — E1140 Type 2 diabetes mellitus with diabetic neuropathy, unspecified: Secondary | ICD-10-CM | POA: Diagnosis present

## 2015-02-02 NOTE — Progress Notes (Signed)
Appt start time: 1730 end time:  1830.  Patient was seen on 02/01/2015 for a review of the series of three diabetes self-management courses at the [facility]. The following learning objectives were met by the patient during this class:  . Reviewed blood glucose monitoring and interpretation including the recommended target ranges and Hgb A1c.  . Reviewed on carb counting, importance of regularly scheduled meals/snacks, and meal planning.  . Reviewed the effects of physical activity on glucose levels and long-term glucose control.  Recommended goal of 150 minutes of physical activity/week. . Reviewed patient medications and discussed role of medication on blood glucose and possible side effects. . Discussed strategies to manage stress, psychosocial issues, and other obstacles to diabetes management. . Encouraged moderate weight reduction to improve glucose levels.   . Reviewed short-term complications: hyper- and hypo-glycemia.  Discussed causes, symptoms, and treatment options. . Reviewed prevention, detection, and treatment of long-term complications.  Discussed the role of prolonged elevated glucose levels on body systems.  Goals:  Follow Diabetes Meal Plan as instructed  Eat 3 meals and 2 snacks, every 3-5 hrs  Limit carbohydrate intake to 45 grams carbohydrate/meal Limit carbohydrate intake to 15 grams carbohydrate/snack Add lean protein foods to meals/snacks  Monitor glucose levels as instructed by your doctor  Aim for goal of 15-30 mins of physical activity daily as tolerated  Bring food record and glucose log to your next nutrition visit   

## 2015-04-26 ENCOUNTER — Other Ambulatory Visit: Payer: Self-pay

## 2015-05-23 ENCOUNTER — Emergency Department (HOSPITAL_BASED_OUTPATIENT_CLINIC_OR_DEPARTMENT_OTHER)
Admission: EM | Admit: 2015-05-23 | Discharge: 2015-05-23 | Disposition: A | Discharge status: Home / Self Care | Payer: Medicare Other | Attending: Emergency Medicine | Admitting: Emergency Medicine

## 2015-05-23 ENCOUNTER — Encounter (HOSPITAL_BASED_OUTPATIENT_CLINIC_OR_DEPARTMENT_OTHER): Payer: Self-pay

## 2015-05-23 DIAGNOSIS — Z79899 Other long term (current) drug therapy: Secondary | ICD-10-CM | POA: Insufficient documentation

## 2015-05-23 DIAGNOSIS — K219 Gastro-esophageal reflux disease without esophagitis: Secondary | ICD-10-CM | POA: Diagnosis not present

## 2015-05-23 DIAGNOSIS — Z87440 Personal history of urinary (tract) infections: Secondary | ICD-10-CM | POA: Insufficient documentation

## 2015-05-23 DIAGNOSIS — E119 Type 2 diabetes mellitus without complications: Secondary | ICD-10-CM | POA: Insufficient documentation

## 2015-05-23 DIAGNOSIS — Z8619 Personal history of other infectious and parasitic diseases: Secondary | ICD-10-CM | POA: Insufficient documentation

## 2015-05-23 DIAGNOSIS — Z86018 Personal history of other benign neoplasm: Secondary | ICD-10-CM | POA: Insufficient documentation

## 2015-05-23 DIAGNOSIS — I10 Essential (primary) hypertension: Secondary | ICD-10-CM | POA: Insufficient documentation

## 2015-05-23 DIAGNOSIS — Z7982 Long term (current) use of aspirin: Secondary | ICD-10-CM | POA: Insufficient documentation

## 2015-05-23 DIAGNOSIS — Z87442 Personal history of urinary calculi: Secondary | ICD-10-CM | POA: Insufficient documentation

## 2015-05-23 DIAGNOSIS — R21 Rash and other nonspecific skin eruption: Secondary | ICD-10-CM | POA: Diagnosis present

## 2015-05-23 DIAGNOSIS — Z91040 Latex allergy status: Secondary | ICD-10-CM | POA: Diagnosis not present

## 2015-05-23 DIAGNOSIS — L509 Urticaria, unspecified: Secondary | ICD-10-CM | POA: Insufficient documentation

## 2015-05-23 LAB — CBG MONITORING, ED: GLUCOSE-CAPILLARY: 131 mg/dL — AB (ref 65–99)

## 2015-05-23 MED ORDER — PREDNISONE 20 MG PO TABS: ORAL_TABLET | ORAL | Status: DC

## 2015-05-23 MED ORDER — HYDROXYZINE HCL 25 MG PO TABS: 25.0000 mg | ORAL_TABLET | Freq: Three times a day (TID) | ORAL | Status: DC | PRN

## 2015-05-23 NOTE — ED Notes (Signed)
States was started on Phenazophridine 200mg  on Tursday July 7th

## 2015-05-23 NOTE — ED Notes (Signed)
MD at bedside. 

## 2015-05-23 NOTE — ED Provider Notes (Signed)
CSN: [id number]     Arrival date & time 05/23/15  0805 History   First MD Initiated Contact with Patient 05/23/15 [phone]     Chief Complaint  Patient presents with  . Rash     (Consider location/radiation/quality/duration/timing/severity/associated sxs/prior Treatment) HPI Comments: Patient presents with a rash. She states that last night she started having an erythematous rash on her forearms that progress quickly throughout her body. She did take some Benadryl which seemed to help initially but when she woke up this morning it was much more predominant. She denies any facial swelling. There is no shortness of breath or wheezing. She has had allergic reactions in the past but not one that looked exactly like this. She did eat some [place] food last night and she is allergic to MSG so she's not sure if that's what triggered it. She denies any fevers. She's had a little bit of malaise over the last week but denies any myalgias or significant symptoms today. She denies any cough or chest congestion. She denies any headaches. She did have some urinary symptoms that started around July 7. She was started on Pyridium. She also had an antibiotic called into [facility] pharmacy that she thinks was Bactrim. She did not pick that up because she left the next day to go on a mission trip. She was then told that the bacteria in her urine culture was resistant to the initial antibiotic so her doctor did call in another prescription which she also has not yet picked up. However she did take a seven-day course of Cipro on her mission trip. She no longer is having urinary symptoms. She states that her doctor rechecked her urine on Friday and she was told that the initial dip was negative. She is not on the culture results on that. She denies any known tick bites.  Patient is a 69 y.o. female presenting with rash.  Rash Associated symptoms: no abdominal pain, no diarrhea, no fever, no headaches, no joint pain, no nausea,  no shortness of breath and not vomiting     Past Medical History  Diagnosis Date  . HTN (hypertension)   . Hyperglycemia   . GERD (gastroesophageal reflux disease)   . Meningioma   . Headache(784.0)   . Hyperlipidemia   . Borderline abnormal TFTs     as a teen  . Nephrolithiasis     Dr Amalia Hailey, [facility], Hx of [3][  . HSV-1 infection   . Polyp of colon   . E. coli UTI 10/05/12    [facility]; resistant only to Septra DS & tetracycline  . Radiculopathy   . Diabetes mellitus without complication    Past Surgical History  Procedure Laterality Date  . Carpal tunnel release      & trigger thumb RUE; Dr Alphonzo Cruise  . Rotator cuff repair      R shoulder  . Tonsillectomy    . Knee arthroscopy      Left  . Breast biopsy      Right  . Colonoscopy w/ polypectomy  07/2004    Neg 2011; Dr Earle Gell  . Cystoscopy  11/2009    Neg  . Abdominal hysterectomy      TAH/BSO --fibroids, no cancer  . Flexible bronchoscopy w/ upper endoscopy      neg  . Esophagogastroduodenoscopy N/A 07/20/2014    Procedure: ESOPHAGOGASTRODUODENOSCOPY (EGD);  Surgeon: Garlan Fair, MD;  Location: [facility];  Service: Endoscopy;  Laterality: N/A;  . Balloon dilation N/A 07/20/2014  Procedure: BALLOON DILATION;  Surgeon: Garlan Fair, MD;  Location: [facility];  Service: Endoscopy;  Laterality: N/A;   Family History  Problem Relation Age of Onset  . Alcohol abuse Mother   . Diabetes Maternal Aunt   . Diabetes Maternal Uncle   . Diabetes Maternal Grandmother   . Tuberculosis Maternal Uncle   . Other Father     unknown  . Coronary artery disease Neg Hx    History  Substance Use Topics  . Smoking status: Never Smoker   . Smokeless tobacco: Never Used  . Alcohol Use: No   OB History    Gravida Para Term Preterm AB TAB SAB Ectopic Multiple Living   3 3 3       3      Review of Systems  Constitutional: Negative for fever, chills and diaphoresis.  HENT: Negative for congestion, rhinorrhea  and sneezing.   Eyes: Negative.   Respiratory: Negative for cough, chest tightness and shortness of breath.   Cardiovascular: Negative for chest pain and leg swelling.  Gastrointestinal: Negative for nausea, vomiting, abdominal pain, diarrhea and blood in stool.  Genitourinary: Negative for frequency, hematuria, flank pain and difficulty urinating.  Musculoskeletal: Negative for back pain and arthralgias.  Skin: Positive for rash.  Neurological: Negative for dizziness, speech difficulty, weakness, numbness and headaches.      Allergies  Codeine; Propoxyphene hcl; Latex; Other; Ciprofloxacin; and Tramadol  Home Medications   Prior to Admission medications   Medication Sig Start Date End Date Taking? Authorizing Provider  aspirin 325 MG EC tablet Take 325 mg by mouth daily.    Historical Provider, MD  glucose blood (FREESTYLE LITE) test strip CHECK BLOOD SUGAR ONCE DAILY AS DIRECTED.  DX:790.29 01/09/14   Hendricks Limes, MD  hydrOXYzine (ATARAX/VISTARIL) 25 MG tablet Take 1 tablet (25 mg total) by mouth every 8 (eight) hours as needed for itching. 05/23/15   Malvin Johns, MD  Lancets (FREESTYLE) lancets Check blood sugar once daily as directed DX:790.29 (FREESTYLE FREEDOM LITE) 01/09/14   Hendricks Limes, MD  metFORMIN (GLUCOPHAGE) 500 MG tablet 1 daily at the completion of largest meal Patient taking differently: 250 mg. 1 daily at the completion of largest meal 08/17/14   Hendricks Limes, MD  mirabegron ER (MYRBETRIQ) 25 MG TB24 tablet Take 25 mg by mouth daily.    Historical Provider, MD  Multiple Vitamin (MULTIVITAMIN) tablet Take 1 tablet by mouth daily.      Historical Provider, MD  NONFORMULARY OR COMPOUNDED ITEM Estradiol .02% 1 ML Prefilled Applicator Sig: apply vaginally twice a week #90 Day Supply with 4 refills 11/05/14   Terrance Mass, MD  omeprazole (PRILOSEC) 20 MG capsule Take 20 mg by mouth daily.    Historical Provider, MD  predniSONE (DELTASONE) 20 MG tablet Take  2 tabs PO once daily for 5 days 05/23/15   Malvin Johns, MD  trimethoprim (TRIMPEX) 100 MG tablet Take 100 mg by mouth daily.    Historical Provider, MD  verapamil (VERELAN PM) 180 MG 24 hr capsule Take 180 mg by mouth at bedtime.    Historical Provider, MD   BP 152/60 mmHg  Pulse 99  Temp(Src) 98.2 F (36.8 C) (Oral)  Resp 18  Ht 5\' 5"  (1.651 m)  Wt 173 lb (78.472 kg)  BMI 28.79 kg/m2  SpO2 97%  LMP 07/30/1990 Physical Exam  Constitutional: She is oriented to person, place, and time. She appears well-developed and well-nourished.  HENT:  Head: Normocephalic and  atraumatic.  Eyes: Pupils are equal, round, and reactive to light.  Neck: Normal range of motion. Neck supple.  Cardiovascular: Normal rate, regular rhythm and normal heart sounds.   Pulmonary/Chest: Effort normal and breath sounds normal. No respiratory distress. She has no wheezes. She has no rales. She exhibits no tenderness.  Abdominal: Soft. Bowel sounds are normal. There is no tenderness. There is no rebound and no guarding.  Musculoskeletal: Normal range of motion. She exhibits no edema.  Lymphadenopathy:    She has no cervical adenopathy.  Neurological: She is alert and oriented to person, place, and time.  Skin: Skin is warm and dry. Rash noted.  Patient has a fine maculopapular rash to the trunk and extremities. There is no rash on the palms of the hands. It is blanching. There's no petechiae or purpura. No vesicular lesions.  Psychiatric: She has a normal mood and affect.    ED Course  Procedures (including critical care time) Labs Review Labs Reviewed  CBG MONITORING, ED - Abnormal; Notable for the following:    Glucose-Capillary 131 (*)    All other components within normal limits  ROCKY MTN SPOTTED FVR ABS PNL(IGG+IGM)    Imaging Review No results found.   EKG Interpretation None      MDM   Final diagnoses:  Urticaria    Patient presents with a diffuse rash. It looks to be consistent with  an allergic reaction. She's having no airway issues or angioedema. I discussed putting her on a short course of steroids. She is a diabetic but she states that she's been on steroids before and it has not significantly raised her blood sugars. I did advise her to keep a close eye on her blood sugars while she's on the steroids. She is given a five-day course of prednisone. As far as her urinary tract infection. She just had a urine culture done on Friday so I don't see the need to repeat this. I did advise her to call her primary care physician tomorrow to make sure that she was adequately treated with Cipro. She is currently asymptomatic for urinary tract infection. The rash doesn't look exactly like [facility] spotted fever and she doesn't have any other symptoms consistent with it however I did go ahead and draw [facility] spider fever titers. At this point I don't feel that she needs preemptive treatment. She was advised to follow-up with her primary care physician if she develops a fever or any worsening symptoms or return here as needed for any facial swelling or shortness of breath.    Malvin Johns, MD 05/23/15 [phone]

## 2015-05-23 NOTE — Discharge Instructions (Signed)
Hives Hives are itchy, red, swollen areas of the skin. They can vary in size and location on your body. Hives can come and go for hours or several days (acute hives) or for several weeks (chronic hives). Hives do not spread from person to person (noncontagious). They may get worse with scratching, exercise, and emotional stress. CAUSES   Allergic reaction to food, additives, or drugs.  Infections, including the common cold.  Illness, such as vasculitis, lupus, or thyroid disease.  Exposure to sunlight, heat, or cold.  Exercise.  Stress.  Contact with chemicals. SYMPTOMS   Red or white swollen patches on the skin. The patches may change size, shape, and location quickly and repeatedly.  Itching.  Swelling of the hands, feet, and face. This may occur if hives develop deeper in the skin. DIAGNOSIS  Your caregiver can usually tell what is wrong by performing a physical exam. Skin or blood tests may also be done to determine the cause of your hives. In some cases, the cause cannot be determined. TREATMENT  Mild cases usually get better with medicines such as antihistamines. Severe cases may require an emergency epinephrine injection. If the cause of your hives is known, treatment includes avoiding that trigger.  HOME CARE INSTRUCTIONS   Avoid causes that trigger your hives.  Take antihistamines as directed by your caregiver to reduce the severity of your hives. Non-sedating or low-sedating antihistamines are usually recommended. Do not drive while taking an antihistamine.  Take any other medicines prescribed for itching as directed by your caregiver.  Wear loose-fitting clothing.  Keep all follow-up appointments as directed by your caregiver. SEEK MEDICAL CARE IF:   You have persistent or severe itching that is not relieved with medicine.  You have painful or swollen joints. SEEK IMMEDIATE MEDICAL CARE IF:   You have a fever.  Your tongue or lips are swollen.  You have  trouble breathing or swallowing.  You feel tightness in the throat or chest.  You have abdominal pain. These problems may be the first sign of a life-threatening allergic reaction. Call your local emergency services (911 in [place] MAKE SURE YOU:   Understand these instructions.  Will watch your condition.  Will get help right away if you are not doing well or get worse. Document Released: 10/16/2005 Document Revised: 10/21/2013 Document Reviewed: 01/09/2012 Exit[employer] Patient Information 2015 [employer]. This information is not intended to replace advice given to you by your health care provider. Make sure you discuss any questions you have with your health care provider.  

## 2015-05-23 NOTE — ED Notes (Signed)
Patient here with rash to chest, trunk, arms.

## 2015-05-23 NOTE — ED Notes (Signed)
Rash on chest & arms, feels hot, now had radiated to back and thighs. States rash began approx 2130hrs last PM

## 2015-05-24 LAB — ROCKY MTN SPOTTED FVR ABS PNL(IGG+IGM)
RMSF IGM: 0.85 {index} (ref 0.00–0.89)
RMSF IgG: NEGATIVE

## 2015-08-09 ENCOUNTER — Ambulatory Visit (INDEPENDENT_AMBULATORY_CARE_PROVIDER_SITE_OTHER): Payer: Medicare Other | Admitting: Gynecology

## 2015-08-09 ENCOUNTER — Encounter: Payer: Self-pay | Admitting: Gynecology

## 2015-08-09 VITALS — BP 140/78

## 2015-08-09 DIAGNOSIS — N9489 Other specified conditions associated with female genital organs and menstrual cycle: Secondary | ICD-10-CM | POA: Diagnosis not present

## 2015-08-09 DIAGNOSIS — N760 Acute vaginitis: Secondary | ICD-10-CM | POA: Diagnosis not present

## 2015-08-09 DIAGNOSIS — N3000 Acute cystitis without hematuria: Secondary | ICD-10-CM | POA: Diagnosis not present

## 2015-08-09 DIAGNOSIS — B9689 Other specified bacterial agents as the cause of diseases classified elsewhere: Secondary | ICD-10-CM

## 2015-08-09 DIAGNOSIS — R350 Frequency of micturition: Secondary | ICD-10-CM | POA: Diagnosis not present

## 2015-08-09 DIAGNOSIS — N898 Other specified noninflammatory disorders of vagina: Secondary | ICD-10-CM

## 2015-08-09 DIAGNOSIS — R300 Dysuria: Secondary | ICD-10-CM | POA: Diagnosis not present

## 2015-08-09 DIAGNOSIS — A499 Bacterial infection, unspecified: Secondary | ICD-10-CM | POA: Diagnosis not present

## 2015-08-09 DIAGNOSIS — IMO0001 Reserved for inherently not codable concepts without codable children: Secondary | ICD-10-CM

## 2015-08-09 LAB — URINALYSIS W MICROSCOPIC + REFLEX CULTURE
Bilirubin Urine: NEGATIVE
Casts: NONE SEEN [LPF]
Crystals: NONE SEEN [HPF]
Ketones, ur: NEGATIVE
Nitrite: POSITIVE — AB
PH: 5.5 (ref 5.0–8.0)
Specific Gravity, Urine: 1.02 (ref 1.001–1.035)
Squamous Epithelial / LPF: NONE SEEN [HPF] (ref <=5)
YEAST: NONE SEEN [HPF]

## 2015-08-09 LAB — WET PREP FOR TRICH, YEAST, CLUE
Clue Cells Wet Prep HPF POC: NONE SEEN
TRICH WET PREP: NONE SEEN
Yeast Wet Prep HPF POC: NONE SEEN

## 2015-08-09 MED ORDER — PHENAZOPYRIDINE HCL 200 MG PO TABS: 200.0000 mg | ORAL_TABLET | Freq: Three times a day (TID) | ORAL | Status: DC | PRN

## 2015-08-09 MED ORDER — SULFAMETHOXAZOLE-TRIMETHOPRIM 800-160 MG PO TABS: 1.0000 | ORAL_TABLET | Freq: Two times a day (BID) | ORAL | Status: DC

## 2015-08-09 MED ORDER — FLUCONAZOLE 150 MG PO TABS: 150.0000 mg | ORAL_TABLET | Freq: Once | ORAL | Status: DC

## 2015-08-09 MED ORDER — METRONIDAZOLE 0.75 % VA GEL: 1.0000 | Freq: Two times a day (BID) | VAGINAL | Status: DC

## 2015-08-09 NOTE — Progress Notes (Signed)
   69 year old patient presented to the office today complaining of several day history of frequency urination some burning and vaginal odor and vaginal irritation. Patient denied any fever, chills, nausea or vomiting some mild low back discomfort. Patient very infrequently sexually active. Patient July of this year was then [place] and had some spare Cipro and had symptoms similar to the Cipro for 3 days. Review of her record indicated that several years ago in 2012 she had a urinary tract infection and the urine culture and sensitivity that the microorganism identified was Escherichia coli.  Exam: Bartholin urethra Skene was within normal limits Vaginal: Cuff intact clear discharge with fishy like odor Bimanual exam: Suprapubic tenderness is noted no adnexal masses or tenderness Rectal exam not done  Wet prep many white blood cells many bacteria  Urinalysis: Many white blood cells many red blood cells and many bacteria  Assessment/plan: #1 bacterial vaginosis will be treated with MetroGel vaginal cream to apply intravaginal twice a day for 7 days #2 urinary tract infection will be treated with Septra DS one by mouth twice a day for 7 days. For bladder spasm she'll be prescribed Pyridium 200 mg 3 times a day for 3 days. #3 in the event that she develops a yeast infection a prescription for Diflucan 150 mg was called in as well.

## 2015-08-09 NOTE — Addendum Note (Signed)
Addended by: Thurnell Garbe A on: 08/09/2015 03:53 PM   Modules accepted: Orders

## 2015-08-09 NOTE — Patient Instructions (Addendum)
Urinary Tract Infection Urinary tract infections (UTIs) can develop anywhere along your urinary tract. Your urinary tract is your body's drainage system for removing wastes and extra water. Your urinary tract includes two kidneys, two ureters, a bladder, and a urethra. Your kidneys are a pair of bean-shaped organs. Each kidney is about the size of your fist. They are located below your ribs, one on each side of your spine. CAUSES Infections are caused by microbes, which are microscopic organisms, including fungi, viruses, and bacteria. These organisms are so small that they can only be seen through a microscope. Bacteria are the microbes that most commonly cause UTIs. SYMPTOMS  Symptoms of UTIs may vary by age and gender of the patient and by the location of the infection. Symptoms in young women typically include a frequent and intense urge to urinate and a painful, burning feeling in the bladder or urethra during urination. Older women and men are more likely to be tired, shaky, and weak and have muscle aches and abdominal pain. A fever may mean the infection is in your kidneys. Other symptoms of a kidney infection include pain in your back or sides below the ribs, nausea, and vomiting. DIAGNOSIS To diagnose a UTI, your caregiver will ask you about your symptoms. Your caregiver will also ask you to provide a urine sample. The urine sample will be tested for bacteria and white blood cells. White blood cells are made by your body to help fight infection. TREATMENT  Typically, UTIs can be treated with medication. Because most UTIs are caused by a bacterial infection, they usually can be treated with the use of antibiotics. The choice of antibiotic and length of treatment depend on your symptoms and the type of bacteria causing your infection. HOME CARE INSTRUCTIONS  If you were prescribed antibiotics, take them exactly as your caregiver instructs you. Finish the medication even if you feel better after  you have only taken some of the medication.  Drink enough water and fluids to keep your urine clear or pale yellow.  Avoid caffeine, tea, and carbonated beverages. They tend to irritate your bladder.  Empty your bladder often. Avoid holding urine for long periods of time.  Empty your bladder before and after sexual intercourse.  After a bowel movement, women should cleanse from front to back. Use each tissue only once. SEEK MEDICAL CARE IF:   You have back pain.  You develop a fever.  Your symptoms do not begin to resolve within 3 days. SEEK IMMEDIATE MEDICAL CARE IF:   You have severe back pain or lower abdominal pain.  You develop chills.  You have nausea or vomiting.  You have continued burning or discomfort with urination. MAKE SURE YOU:   Understand these instructions.  Will watch your condition.  Will get help right away if you are not doing well or get worse.   This information is not intended to replace advice given to you by your health care provider. Make sure you discuss any questions you have with your health care provider.   Document Released: 07/26/2005 Document Revised: 07/07/2015 Document Reviewed: 11/24/2011 Elsevier Interactive Patient Education 2016 Elsevier Inc. Metronidazole vaginal gel What is this medicine? METRONIDAZOLE (me troe NI da zole) VAGINAL GEL is an antiinfective. It is used to treat bacterial vaginitis. This medicine may be used for other purposes; ask your health care provider or pharmacist if you have questions. What should I tell my health care provider before I take this medicine? They need to know  if you have any of these conditions: -if you drink alcohol containing drinks -if you have taken disulfiram in the past two weeks -liver disease -peripheral neuropathy -seizures -an unusual or allergic reaction to metronidazole, parabens, nitroimidazoles, or other medicines, foods, dyes, or preservatives -pregnant or trying to get  pregnant -breast-feeding How should I use this medicine? This medicine is only for use in the vagina. Do not take by mouth or apply to other areas of the body. Follow the directions on the prescription label. Wash hands before and after use. Screw the applicator to the tube and squeeze the tube gently to fill the applicator. Lie on your back, part and bend your knees. Insert the applicator tip high in the vagina and push the plunger to release the gel into the vagina. Gently remove the applicator. Wash the applicator well with warm water and soap. Use at regular intervals. Finish the full course prescribed by your doctor or health care professional even if you think your condition is better. Do not stop using except on the advice of your doctor or health care professional. Talk to your pediatrician regarding the use of this medicine in children. Special care may be needed. Overdosage: If you think you have taken too much of this medicine contact a poison control center or emergency room at once. NOTE: This medicine is only for you. Do not share this medicine with others. What if I miss a dose? If you miss a dose, use it as soon as you can. If it is almost time for your next dose, use only that dose. Do not use double or extra doses. What may interact with this medicine? Do not take this medicine with any of the following medications: -alcohol or any product that contains alcohol -cisapride -dofetilide -dronedarone -pimozide -thioridazine -ziprasidone This medicine may also interact with the following medications: -cimetidine -lithium -other medicines that prolong the QT interval (cause an abnormal heart rhythm) -warfarin This list may not describe all possible interactions. Give your health care provider a list of all the medicines, herbs, non-prescription drugs, or dietary supplements you use. Also tell them if you smoke, drink alcohol, or use illegal drugs. Some items may interact with your  medicine. What should I watch for while using this medicine? Tell your doctor or health care professional if your symptoms do not start to get better in 2 or 3 days. Avoid alcoholic drinks while you are taking this medicine and for three days afterwards. Alcohol may make you feel dizzy, sick, or flushed. You may get drowsy or dizzy. Do not drive, use machinery, or do anything that needs mental alertness until you know how this medicine affects you. To reduce the risk of dizzy or fainting spells, do not sit or stand up quickly, especially if you are an older patient. Your clothing may get soiled if you have a vaginal discharge. You can wear a sanitary napkin. Do not use tampons. Wear freshly washed cotton, not synthetic, panties. Do not have sex until you have finished your treatment. Having sex can make the treatment less effective. Your sexual partner may also need treatment. What side effects may I notice from receiving this medicine? Side effects that you should report to your doctor or health care professional as soon as possible: -dizziness -frequent passing of urine -headache -loss of appetite -nausea -skin rash, itching -stomach pain or cramps -vaginal irritation or discharge -vulvar burning or swelling Side effects that usually do not require medical attention (report to your doctor  or health care professional if they continue or are bothersome): -dark urine -mild vaginal burning This list may not describe all possible side effects. Call your doctor for medical advice about side effects. You may report side effects to FDA at 1-800-FDA-1088. Where should I keep my medicine? Keep out of the reach of children. Store at room temperature between 15 and 30 degrees C (59 and 86 degrees F). Do not freeze. Throw away any unused medicine after the expiration date. NOTE: This sheet is a summary. It may not cover all possible information. If you have questions about this medicine, talk to your  doctor, pharmacist, or health care provider.    2016, Elsevier/Gold Standard. (2013-05-23 14:09:23)

## 2015-08-11 LAB — URINE CULTURE
Colony Count: NO GROWTH
ORGANISM ID, BACTERIA: NO GROWTH

## 2015-10-14 DIAGNOSIS — N390 Urinary tract infection, site not specified: Secondary | ICD-10-CM | POA: Insufficient documentation

## 2015-10-30 ENCOUNTER — Emergency Department (HOSPITAL_BASED_OUTPATIENT_CLINIC_OR_DEPARTMENT_OTHER)
Admission: EM | Admit: 2015-10-30 | Discharge: 2015-10-30 | Discharge status: Against Medical Advice | Payer: Medicare Other | Attending: Emergency Medicine | Admitting: Emergency Medicine

## 2015-10-30 ENCOUNTER — Encounter (HOSPITAL_BASED_OUTPATIENT_CLINIC_OR_DEPARTMENT_OTHER): Payer: Self-pay | Admitting: *Deleted

## 2015-10-30 DIAGNOSIS — R300 Dysuria: Secondary | ICD-10-CM | POA: Diagnosis not present

## 2015-10-30 DIAGNOSIS — I10 Essential (primary) hypertension: Secondary | ICD-10-CM | POA: Diagnosis not present

## 2015-10-30 DIAGNOSIS — E119 Type 2 diabetes mellitus without complications: Secondary | ICD-10-CM | POA: Insufficient documentation

## 2015-10-30 DIAGNOSIS — R350 Frequency of micturition: Secondary | ICD-10-CM | POA: Diagnosis not present

## 2015-10-30 LAB — URINALYSIS, ROUTINE W REFLEX MICROSCOPIC
Bilirubin Urine: NEGATIVE
Glucose, UA: NEGATIVE mg/dL
Hgb urine dipstick: NEGATIVE
KETONES UR: NEGATIVE mg/dL
Nitrite: POSITIVE — AB
PROTEIN: NEGATIVE mg/dL
Specific Gravity, Urine: 1.003 — ABNORMAL LOW (ref 1.005–1.030)
pH: 6 (ref 5.0–8.0)

## 2015-10-30 LAB — URINE MICROSCOPIC-ADD ON

## 2015-10-30 NOTE — ED Notes (Signed)
Urinary frequency, denies dysuria that started this mornign.

## 2015-10-30 NOTE — ED Notes (Signed)
Pt leaving the waiting area, stating that something's come up and she's unable to stay. Informed EDP (pt already left the parking lot) -- EDP to call pt to ask her to come back for prescription for UTI.

## 2015-10-30 NOTE — ED Provider Notes (Signed)
I contacted the patients husband and asked that she return to the ER as she appears to have a UTI. He understands and reports he will pass the information on to the patient  Jola Schmidt, MD 10/30/15 [phone]

## 2015-10-31 ENCOUNTER — Encounter (HOSPITAL_BASED_OUTPATIENT_CLINIC_OR_DEPARTMENT_OTHER): Payer: Self-pay | Admitting: Emergency Medicine

## 2015-10-31 ENCOUNTER — Emergency Department (HOSPITAL_BASED_OUTPATIENT_CLINIC_OR_DEPARTMENT_OTHER)
Admission: EM | Admit: 2015-10-31 | Discharge: 2015-10-31 | Disposition: A | Discharge status: Home / Self Care | Payer: Medicare Other | Attending: Emergency Medicine | Admitting: Emergency Medicine

## 2015-10-31 DIAGNOSIS — Z792 Long term (current) use of antibiotics: Secondary | ICD-10-CM | POA: Diagnosis not present

## 2015-10-31 DIAGNOSIS — Z7984 Long term (current) use of oral hypoglycemic drugs: Secondary | ICD-10-CM | POA: Diagnosis not present

## 2015-10-31 DIAGNOSIS — Z90710 Acquired absence of both cervix and uterus: Secondary | ICD-10-CM | POA: Insufficient documentation

## 2015-10-31 DIAGNOSIS — Z87442 Personal history of urinary calculi: Secondary | ICD-10-CM | POA: Insufficient documentation

## 2015-10-31 DIAGNOSIS — R1030 Lower abdominal pain, unspecified: Secondary | ICD-10-CM | POA: Diagnosis present

## 2015-10-31 DIAGNOSIS — E119 Type 2 diabetes mellitus without complications: Secondary | ICD-10-CM | POA: Diagnosis not present

## 2015-10-31 DIAGNOSIS — N390 Urinary tract infection, site not specified: Secondary | ICD-10-CM | POA: Insufficient documentation

## 2015-10-31 DIAGNOSIS — Z9889 Other specified postprocedural states: Secondary | ICD-10-CM | POA: Insufficient documentation

## 2015-10-31 DIAGNOSIS — I10 Essential (primary) hypertension: Secondary | ICD-10-CM | POA: Insufficient documentation

## 2015-10-31 DIAGNOSIS — Z86011 Personal history of benign neoplasm of the brain: Secondary | ICD-10-CM | POA: Insufficient documentation

## 2015-10-31 DIAGNOSIS — Z7982 Long term (current) use of aspirin: Secondary | ICD-10-CM | POA: Diagnosis not present

## 2015-10-31 DIAGNOSIS — Z91040 Latex allergy status: Secondary | ICD-10-CM | POA: Insufficient documentation

## 2015-10-31 DIAGNOSIS — Z8619 Personal history of other infectious and parasitic diseases: Secondary | ICD-10-CM | POA: Insufficient documentation

## 2015-10-31 DIAGNOSIS — Z86010 Personal history of colonic polyps: Secondary | ICD-10-CM | POA: Diagnosis not present

## 2015-10-31 DIAGNOSIS — Z794 Long term (current) use of insulin: Secondary | ICD-10-CM | POA: Insufficient documentation

## 2015-10-31 DIAGNOSIS — Z8739 Personal history of other diseases of the musculoskeletal system and connective tissue: Secondary | ICD-10-CM | POA: Insufficient documentation

## 2015-10-31 MED ORDER — CEPHALEXIN 500 MG PO CAPS: 500.0000 mg | ORAL_CAPSULE | Freq: Two times a day (BID) | ORAL | Status: DC

## 2015-10-31 MED ORDER — CEPHALEXIN 250 MG PO CAPS
1000.0000 mg | ORAL_CAPSULE | Freq: Once | ORAL | Status: AC
  Administered 2015-10-31: 1000 mg via ORAL
  Filled 2015-10-31: qty 4

## 2015-10-31 NOTE — ED Notes (Signed)
Patient was here earlier for Uti symtoms and did not want to wait ... MD Venora Maples called and tried to get her to come back and patient is just now coming back to be treated.

## 2015-10-31 NOTE — ED Provider Notes (Addendum)
CSN: [id number]     Arrival date & time 10/31/15  0038 History   First MD Initiated Contact with Patient 10/31/15 0107     Chief Complaint  Patient presents with  . Urinary Tract Infection     (Consider location/radiation/quality/duration/timing/severity/associated sxs/prior Treatment) HPI  This is a 70 year old female with a history of urinary tract infections. She is here with urinary frequency that began yesterday morning. There is some mild suprapubic tenderness associated with it. She denies burning with urination but thinks this may be due to her taking some leftover Pyridium yesterday. She denies fever, chills, nausea or vomiting.  Past Medical History  Diagnosis Date  . HTN (hypertension)   . Hyperglycemia   . GERD (gastroesophageal reflux disease)   . Meningioma ([place])   . Headache(784.0)   . Hyperlipidemia   . Borderline abnormal TFTs     as a teen  . Nephrolithiasis     Dr Amalia Hailey, [facility], Hx of [3][  . HSV-1 infection   . Polyp of colon   . E. coli UTI 10/05/12    [facility]; resistant only to Septra DS & tetracycline  . Radiculopathy   . Diabetes mellitus without complication [facility])    Past Surgical History  Procedure Laterality Date  . Carpal tunnel release      & trigger thumb RUE; Dr Alphonzo Cruise  . Rotator cuff repair      R shoulder  . Tonsillectomy    . Knee arthroscopy      Left  . Breast biopsy      Right  . Colonoscopy w/ polypectomy  07/2004    Neg 2011; Dr Earle Gell  . Cystoscopy  11/2009    Neg  . Abdominal hysterectomy      TAH/BSO --fibroids, no cancer  . Flexible bronchoscopy w/ upper endoscopy      neg  . Esophagogastroduodenoscopy N/A 07/20/2014    Procedure: ESOPHAGOGASTRODUODENOSCOPY (EGD);  Surgeon: Garlan Fair, MD;  Location: [facility];  Service: Endoscopy;  Laterality: N/A;  . Balloon dilation N/A 07/20/2014    Procedure: BALLOON DILATION;  Surgeon: Garlan Fair, MD;  Location: [facility] ENDOSCOPY;  Service: Endoscopy;  Laterality:  N/A;   Family History  Problem Relation Age of Onset  . Alcohol abuse Mother   . Diabetes Maternal Aunt   . Diabetes Maternal Uncle   . Diabetes Maternal Grandmother   . Tuberculosis Maternal Uncle   . Other Father     unknown  . Coronary artery disease Neg Hx    Social History  Substance Use Topics  . Smoking status: Never Smoker   . Smokeless tobacco: Never Used  . Alcohol Use: No   OB History    Gravida Para Term Preterm AB TAB SAB Ectopic Multiple Living   3 3 3       3      Review of Systems  All other systems reviewed and are negative.   Allergies  Codeine; Propoxyphene hcl; Latex; Macrodantin; Other; Ciprofloxacin; and Tramadol  Home Medications   Prior to Admission medications   Medication Sig Start Date End Date Taking? Authorizing Provider  aspirin 325 MG EC tablet Take 325 mg by mouth daily.    Historical Provider, MD  fluconazole (DIFLUCAN) 150 MG tablet Take 1 tablet (150 mg total) by mouth once. 08/09/15   Terrance Mass, MD  glucose blood (FREESTYLE LITE) test strip CHECK BLOOD SUGAR ONCE DAILY AS DIRECTED.  DX:790.29 01/09/14   Hendricks Limes, MD  hydrOXYzine (  ATARAX/VISTARIL) 25 MG tablet Take 1 tablet (25 mg total) by mouth every 8 (eight) hours as needed for itching. Patient not taking: Reported on 08/09/2015 05/23/15   Malvin Johns, MD  Lancets (FREESTYLE) lancets Check blood sugar once daily as directed DX:790.29 (FREESTYLE FREEDOM LITE) 01/09/14   Hendricks Limes, MD  metFORMIN (GLUCOPHAGE) 500 MG tablet 1 daily at the completion of largest meal Patient taking differently: 250 mg. 1 daily at the completion of largest meal 08/17/14   Hendricks Limes, MD  metroNIDAZOLE (METROGEL) 0.75 % vaginal gel Place 1 Applicatorful vaginally 2 (two) times daily. 08/09/15   Terrance Mass, MD  mirabegron ER (MYRBETRIQ) 25 MG TB24 tablet Take 25 mg by mouth daily.    Historical Provider, MD  Multiple Vitamin (MULTIVITAMIN) tablet Take 1 tablet by mouth daily.       Historical Provider, MD  NONFORMULARY OR COMPOUNDED ITEM Estradiol .02% 1 ML Prefilled Applicator Sig: apply vaginally twice a week #90 Day Supply with 4 refills 11/05/14   Terrance Mass, MD  omeprazole (PRILOSEC) 20 MG capsule Take 20 mg by mouth daily.    Historical Provider, MD  phenazopyridine (PYRIDIUM) 200 MG tablet Take 1 tablet (200 mg total) by mouth 3 (three) times daily as needed for pain. 08/09/15   Terrance Mass, MD  predniSONE (DELTASONE) 20 MG tablet Take 2 tabs PO once daily for 5 days Patient not taking: Reported on 08/09/2015 05/23/15   Malvin Johns, MD  sulfamethoxazole-trimethoprim (BACTRIM DS,SEPTRA DS) 800-160 MG tablet Take 1 tablet by mouth 2 (two) times daily. 08/09/15   Terrance Mass, MD  trimethoprim (TRIMPEX) 100 MG tablet Take 100 mg by mouth daily.    Historical Provider, MD  verapamil (VERELAN PM) 180 MG 24 hr capsule Take 180 mg by mouth at bedtime.    Historical Provider, MD   BP 153/75 mmHg  Pulse 90  Temp(Src) 98 F (36.7 C) (Oral)  Resp 18  Ht 5\' 5"  (1.651 m)  Wt 174 lb (78.926 kg)  BMI 28.96 kg/m2  SpO2 95%  LMP 07/30/1990   Physical Exam  General: Well-developed, well-nourished female in no acute distress; appearance consistent with age of record HENT: normocephalic; atraumatic Eyes: pupils equal, round and reactive to light; extraocular muscles intact; arcus senilis bilaterally Neck: supple Heart: regular rate and rhythm Lungs: clear to auscultation bilaterally Abdomen: soft; nondistended; mild suprapubic tenderness; no masses or hepatosplenomegaly; bowel sounds present Back: Mild right lower rib tenderness; no CVA tenderness Extremities: No deformity; full range of motion Neurologic: Awake, alert and oriented; motor function intact in all extremities and symmetric; no facial droop Skin: Warm and dry Psychiatric: Normal mood and affect    ED Course  Procedures (including critical care time)   MDM   Nursing notes and vitals  signs, including pulse oximetry, reviewed.  Summary of this visit's results, reviewed by myself:  Labs:  Results for orders placed or performed during the hospital encounter of 10/30/15 (from the past 24 hour(s))  Urinalysis, Routine w reflex microscopic (not at [facility])     Status: Abnormal   Collection Time: 10/30/15  3:00 PM  Result Value Ref Range   Color, Urine ORANGE (A) YELLOW   APPearance CLEAR CLEAR   Specific Gravity, Urine 1.003 (L) 1.005 - 1.030   pH 6.0 5.0 - 8.0   Glucose, UA NEGATIVE NEGATIVE mg/dL   Hgb urine dipstick NEGATIVE NEGATIVE   Bilirubin Urine NEGATIVE NEGATIVE   Ketones, ur NEGATIVE NEGATIVE mg/dL  Protein, ur NEGATIVE NEGATIVE mg/dL   Nitrite POSITIVE (A) NEGATIVE   Leukocytes, UA TRACE (A) NEGATIVE  Urine microscopic-add on     Status: Abnormal   Collection Time: 10/30/15  3:00 PM  Result Value Ref Range   Squamous Epithelial / LPF 0-5 (A) NONE SEEN   WBC, UA 6-30 0 - 5 WBC/hpf   RBC / HPF 0-5 0 - 5 RBC/hpf   Bacteria, UA RARE (A) NONE SEEN      Shanon Rosser, MD 10/31/15 [id number]  Shanon Rosser, MD 10/31/15 [id number]

## 2015-11-01 LAB — URINE CULTURE: CULTURE: NO GROWTH

## 2015-11-08 ENCOUNTER — Ambulatory Visit (INDEPENDENT_AMBULATORY_CARE_PROVIDER_SITE_OTHER): Payer: Medicare Other | Admitting: Gynecology

## 2015-11-08 ENCOUNTER — Encounter: Payer: Self-pay | Admitting: Gynecology

## 2015-11-08 VITALS — BP 134/70 | Ht 64.5 in | Wt 176.0 lb

## 2015-11-08 DIAGNOSIS — R350 Frequency of micturition: Secondary | ICD-10-CM

## 2015-11-08 DIAGNOSIS — Z780 Asymptomatic menopausal state: Secondary | ICD-10-CM | POA: Diagnosis not present

## 2015-11-08 DIAGNOSIS — Z01419 Encounter for gynecological examination (general) (routine) without abnormal findings: Secondary | ICD-10-CM

## 2015-11-08 DIAGNOSIS — N952 Postmenopausal atrophic vaginitis: Secondary | ICD-10-CM | POA: Diagnosis not present

## 2015-11-08 DIAGNOSIS — IMO0001 Reserved for inherently not codable concepts without codable children: Secondary | ICD-10-CM

## 2015-11-08 LAB — URINALYSIS W MICROSCOPIC + REFLEX CULTURE
Bacteria, UA: NONE SEEN [HPF]
Bilirubin Urine: NEGATIVE
CASTS: NONE SEEN [LPF]
Crystals: NONE SEEN [HPF]
Glucose, UA: NEGATIVE
HGB URINE DIPSTICK: NEGATIVE
KETONES UR: NEGATIVE
NITRITE: NEGATIVE
PH: 5.5 (ref 5.0–8.0)
PROTEIN: NEGATIVE
RBC / HPF: NONE SEEN RBC/HPF (ref <=2)
SPECIFIC GRAVITY, URINE: 1.02 (ref 1.001–1.035)
YEAST: NONE SEEN [HPF]

## 2015-11-08 MED ORDER — NONFORMULARY OR COMPOUNDED ITEM: Status: DC

## 2015-11-08 NOTE — Progress Notes (Signed)
Kristen Murray 1946-09-27 [id number]   History:    70 y.o.  for annual gyn exam who is recently completing her treatment for urinary tract infection by her PCP Dr. Laurann Montana who is also been doing her blood work. Patient with past history of total abdominal hysterectomy bilateral salpingo- oophorectomy.Patient with no prior history of abnormal Pap smears. As a result of her vaginal atrophy she had been prescribed topical vaginal estrogen 0.02% to apply twice a week. Her PCP is treating her for hypertension and type 2 diabetes. She had a normal colonoscopy in 2011. Her bone density study was normal in 2015. Her flu vaccine is up-to-date but she has not received her shingles vaccine yet.  Past medical history,surgical history, family history and social history were all reviewed and documented in the EPIC chart.  Gynecologic History Patient's last menstrual period was 07/30/1990. Contraception: status post hysterectomy Last Pap: 2012 and 2016. Results were: normal Last mammogram: 2016. Results were: normal  Obstetric History OB History  Gravida Para Term Preterm AB SAB TAB Ectopic Multiple Living  3 3 3       3     # Outcome Date GA Lbr Len/2nd Weight Sex Delivery Anes PTL Lv  3 Term     F Vag-Spont  N Y  2 Term     F Vag-Spont  N Y  1 Term     F Vag-Spont  N Y       ROS: A ROS was performed and pertinent positives and negatives are included in the history.  GENERAL: No fevers or chills. HEENT: No change in vision, no earache, sore throat or sinus congestion. NECK: No pain or stiffness. CARDIOVASCULAR: No chest pain or pressure. No palpitations. PULMONARY: No shortness of breath, cough or wheeze. GASTROINTESTINAL: No abdominal pain, nausea, vomiting or diarrhea, melena or bright red blood per rectum. GENITOURINARY: No urinary frequency, urgency, hesitancy or dysuria. MUSCULOSKELETAL: No joint or muscle pain, no back pain, no recent trauma. DERMATOLOGIC: No rash, no itching, no lesions.  ENDOCRINE: No polyuria, polydipsia, no heat or cold intolerance. No recent change in weight. HEMATOLOGICAL: No anemia or easy bruising or bleeding. NEUROLOGIC: No headache, seizures, numbness, tingling or weakness. PSYCHIATRIC: No depression, no loss of interest in normal activity or change in sleep pattern.     Exam: chaperone present  BP 134/70 mmHg  Ht 5' 4.5" (1.638 m)  Wt 176 lb (79.833 kg)  BMI 29.75 kg/m2  LMP 07/30/1990  Body mass index is 29.75 kg/(m^2).  General appearance : Well developed well nourished female. No acute distress HEENT: Eyes: no retinal hemorrhage or exudates,  Neck supple, trachea midline, no carotid bruits, no thyroidmegaly Lungs: Clear to auscultation, no rhonchi or wheezes, or rib retractions  Heart: Regular rate and rhythm, no murmurs or gallops Breast:Examined in sitting and supine position were symmetrical in appearance, no palpable masses or tenderness,  no skin retraction, no nipple inversion, no nipple discharge, no skin discoloration, no axillary or supraclavicular lymphadenopathy Abdomen: no palpable masses or tenderness, no rebound or guarding Extremities: no edema or skin discoloration or tenderness  Pelvic:  Bartholin, Urethra, Skene Glands: Within normal limits             Vagina: No gross lesions or discharge, atrophic changes  Cervix: Absent  Uterus absent  Adnexa  Without masses or tenderness  Anus and perineum  normal   Rectovaginal  normal sphincter tone without palpated masses or tenderness  Hemoccult PCP provides     Assessment/Plan:  70 y.o. female for annual exam has done well on vaginal estrogen twice a week for vaginal atrophy. Patient reports no vaginal bleeding. Requisition to go to her local pharmacy for the shingles vaccine was provided. She was reminded to schedule her bone density study here in April. Also later this year she will need her mammogram as well. We discussed importance of calcium vitamin D and  regular exercise for osteoporosis prevention. Patient will no longer need Pap smears according to the new guidelines. PCP doing her blood work.   Terrance Mass MD, 11:18 AM 11/08/2015

## 2015-11-10 LAB — URINE CULTURE

## 2015-11-11 ENCOUNTER — Other Ambulatory Visit: Payer: Self-pay | Admitting: Gynecology

## 2015-11-11 MED ORDER — PENICILLIN V POTASSIUM 250 MG PO TABS: 250.0000 mg | ORAL_TABLET | Freq: Two times a day (BID) | ORAL | Status: DC

## 2015-11-11 NOTE — Progress Notes (Signed)
One week after completing the treatment that I have proposed

## 2015-11-12 ENCOUNTER — Telehealth: Payer: Self-pay

## 2015-11-12 ENCOUNTER — Other Ambulatory Visit: Payer: Self-pay | Admitting: Gynecology

## 2015-11-12 DIAGNOSIS — N3000 Acute cystitis without hematuria: Secondary | ICD-10-CM

## 2015-11-12 NOTE — Telephone Encounter (Signed)
You had prescribed Pen Vee K 250mg  for patient yesterday for UTI.  Patient mentioned that she had been in ER with UTI sx on 10/25/15 and was treated with Keflex x 7 days. I told her to go ahead and take Rx you prescribed.  I wanted to check with you if you wanted her to recheck urine after this round of antibiotics in light of that info.

## 2015-11-12 NOTE — Telephone Encounter (Signed)
I spoke with patient and she has already started the Ampicillin and will finish the Rx and come for u/a in the week or two after completing Rx. Order placed. She will call for lab appt.

## 2015-11-12 NOTE — Telephone Encounter (Signed)
Yes this was based on culture sensitivity take as prescribed

## 2015-11-24 ENCOUNTER — Other Ambulatory Visit: Payer: Medicare Other

## 2015-11-24 DIAGNOSIS — N3000 Acute cystitis without hematuria: Secondary | ICD-10-CM

## 2015-11-25 LAB — URINALYSIS W MICROSCOPIC + REFLEX CULTURE
BACTERIA UA: NONE SEEN [HPF]
BILIRUBIN URINE: NEGATIVE
Casts: NONE SEEN [LPF]
Crystals: NONE SEEN [HPF]
GLUCOSE, UA: NEGATIVE
HGB URINE DIPSTICK: NEGATIVE
Ketones, ur: NEGATIVE
LEUKOCYTES UA: NEGATIVE
NITRITE: NEGATIVE
PH: 6.5 (ref 5.0–8.0)
PROTEIN: NEGATIVE
RBC / HPF: NONE SEEN RBC/HPF (ref <=2)
SQUAMOUS EPITHELIAL / LPF: NONE SEEN [HPF] (ref <=5)
Specific Gravity, Urine: 1.012 (ref 1.001–1.035)
WBC UA: NONE SEEN WBC/HPF (ref <=5)
YEAST: NONE SEEN [HPF]

## 2015-12-17 ENCOUNTER — Encounter: Payer: Self-pay | Admitting: Gynecology

## 2016-02-10 ENCOUNTER — Other Ambulatory Visit: Payer: Self-pay | Admitting: Gynecology

## 2016-02-10 ENCOUNTER — Ambulatory Visit (INDEPENDENT_AMBULATORY_CARE_PROVIDER_SITE_OTHER): Payer: Medicare Other

## 2016-02-10 DIAGNOSIS — Z780 Asymptomatic menopausal state: Secondary | ICD-10-CM

## 2016-05-31 ENCOUNTER — Encounter: Payer: Self-pay | Admitting: Gynecology

## 2016-05-31 ENCOUNTER — Ambulatory Visit (INDEPENDENT_AMBULATORY_CARE_PROVIDER_SITE_OTHER): Payer: Medicare Other | Admitting: Gynecology

## 2016-05-31 VITALS — BP 128/76

## 2016-05-31 DIAGNOSIS — N898 Other specified noninflammatory disorders of vagina: Secondary | ICD-10-CM | POA: Diagnosis not present

## 2016-05-31 DIAGNOSIS — R0981 Nasal congestion: Secondary | ICD-10-CM | POA: Diagnosis not present

## 2016-05-31 DIAGNOSIS — N952 Postmenopausal atrophic vaginitis: Secondary | ICD-10-CM | POA: Diagnosis not present

## 2016-05-31 LAB — WET PREP FOR TRICH, YEAST, CLUE
Clue Cells Wet Prep HPF POC: NONE SEEN
Trich, Wet Prep: NONE SEEN
YEAST WET PREP: NONE SEEN

## 2016-05-31 MED ORDER — NONFORMULARY OR COMPOUNDED ITEM: 4 refills | Status: DC

## 2016-05-31 MED ORDER — AMOXICILLIN-POT CLAVULANATE 250-125 MG PO TABS: 1.0000 | ORAL_TABLET | Freq: Three times a day (TID) | ORAL | Status: DC

## 2016-05-31 MED ORDER — FLUCONAZOLE 150 MG PO TABS: 150.0000 mg | ORAL_TABLET | Freq: Once | ORAL | 0 refills | Status: AC

## 2016-05-31 NOTE — Progress Notes (Signed)
   HPI: Patient is a 70 year old that presented to the office today complaining of a yellowish-like discharge for the past few days. Patient also has been complaining of sinus congestion and postnasal drip and a dry cough. Patient also needs her prescription refill for her vaginal estrogen that she applies twice a week for her vaginal atrophy. Patient denies any fever, chills, nausea or vomiting. No dysuria or frequency no back pain.   ROS: A ROS was performed and pertinent positives and negatives are included in the history.  GENERAL: No fevers or chills. HEENT: Sinus congestion postnasal drip NECK: No pain or stiffness. CARDIOVASCULAR: No chest pain or pressure. No palpitations. PULMONARY: Try nonproductive cough GASTROINTESTINAL: No abdominal pain, nausea, vomiting or diarrhea, melena or bright red blood per rectum. GENITOURINARY: No urinary frequency, urgency, hesitancy or dysuria. MUSCULOSKELETAL: No joint or muscle pain, no back pain, no recent trauma. DERMATOLOGIC: No rash, no itching, no lesions. ENDOCRINE: No polyuria, polydipsia, no heat or cold intolerance. No recent change in weight. HEMATOLOGICAL: No anemia or easy bruising or bleeding. NEUROLOGIC: No headache, seizures, numbness, tingling or weakness. PSYCHIATRIC: No depression, no loss of interest in normal activity or change in sleep pattern.   PE: Blood pressure 128/76 Gen. appearance well-developed well-nourished female with a complaint above of sinus congestion and vaginal discharge. HEENT: Tender sinus (frontal and maxillary) Lungs: Clear to auscultation Rogers or wheezes Pelvic exam: Bartholin urethra Skene was within normal limits Vagina: Atrophic changes very little discharge of any was noted  Vaginal cuff intact Bimanual exam no palpable masses or tenderness Rectal exam: Not done  Wet prep few white blood cell few bacteria  Assessment Plan: #1 acute sinusitis will be treated with Augmentin 1 tablet every 8 hours for one  week. She can purchase over-the-counter Mucinex to help loosen her sinus mucous. #2 patient will be prescribed Diflucan 150 mg since she gets yeast infection after taking antibiotics. #3 refill for her vaginal estrogen at the compound pharmacy to use twice a week for her vaginal atrophy.    Greater than 50% of time was spent in counseling and coordinating care of this patient.   Time of consultation: 15   Minutes.

## 2016-05-31 NOTE — Patient Instructions (Signed)

## 2016-05-31 NOTE — Addendum Note (Signed)
Addended by: Joaquin Music on: 05/31/2016 12:46 PM   Modules accepted: Orders

## 2016-09-29 ENCOUNTER — Other Ambulatory Visit: Payer: Self-pay | Admitting: Sports Medicine

## 2016-09-29 DIAGNOSIS — M19072 Primary osteoarthritis, left ankle and foot: Secondary | ICD-10-CM

## 2016-10-04 ENCOUNTER — Encounter: Payer: Medicare Other | Attending: Internal Medicine | Admitting: *Deleted

## 2016-10-04 DIAGNOSIS — E119 Type 2 diabetes mellitus without complications: Secondary | ICD-10-CM | POA: Insufficient documentation

## 2016-10-04 DIAGNOSIS — Z713 Dietary counseling and surveillance: Secondary | ICD-10-CM | POA: Diagnosis present

## 2016-10-04 DIAGNOSIS — E1149 Type 2 diabetes mellitus with other diabetic neurological complication: Secondary | ICD-10-CM

## 2016-10-04 NOTE — Progress Notes (Signed)
Diabetes Self-Management Education  Visit Type:  Follow-up  Appt. Start Time: 1000 Appt. End Time: 1130  10/04/2016  [name], identified by name and date of birth, is a 70 y.o. female with a diagnosis of Diabetes:  .  She attended Core Classes last year, would like a review of Carb Counting and healthy eating suggestions for some weight loss.   ASSESSMENT  Height 5\' 5"  (1.651 m), weight 178 lb 11.2 oz (81.1 kg), last menstrual period 07/30/1990. Body mass index is 29.74 kg/m.       Diabetes Self-Management Education - [id number]      Complications   Last HgB A1C per patient/outside source 6.8 %   How often do you check your blood sugar? 1-2 times/day   Fasting Blood glucose range (mg/dL) 130-179   Number of hypoglycemic episodes per month --  0   Have you had a dilated eye exam in the past 12 months? Yes   Have you had a dental exam in the past 12 months? Yes   Are you checking your feet? Yes   How many days per week are you checking your feet? 6     Dietary Intake   Breakfast regular oatmeal with cinnamon and nutmeg, with 1 or 2% milk and 1/3 of an apple, occasionally with toast and PNB OR toast with PNB on Sundays   Snack (morning) occasionally if working- PNB crackers OR raw veggies   Lunch brings from home- lean meat sandwich with corn chips rest of apple   Snack (afternoon) not usually   Dinner stews OR other crock pot meal with corn bread OR lean meat, starch, vegetables    Snack (evening) occasionally cookie, OR corn chips OR bowl of cereal without the raisins   Beverage(s) black coffee, water, regular soda occasionally, diluted sweet tea with extra ice     Exercise   Exercise Type ADL's  foot pain so not able to walk as usual for exercise     Patient Education   Previous Diabetes Education Yes (please comment)  1 year ago     Individualized Goals (developed by patient)   Nutrition Follow meal plan discussed   Physical Activity Exercise 3-5 times per  week     Outcomes   Program Status Completed      Learning Objective:  Patient will have a greater understanding of diabetes self-management. Patient education plan is to attend individual and/or group sessions per assessed needs and concerns.   Plan:   Patient Instructions  Plan:  Aim for 2 Carb Choices per meal (30 grams) +/- 1 either way  Aim for 0-1 Carbs per snack if hungry  Include protein in moderation with your meals and snacks Continue reading food labels for Total Carbohydrate of foods Consider  increasing your activity level by doing some Arm Chair Exercises for 15 minutes twice daily and increase as tolerated Consider checking BG at alternate times per day  Continue taking medication Metformin as directed by MD       Expected Outcomes:  Demonstrated interest in learning. Expect positive outcomes  Education material provided: A1C conversion sheet, Meal plan card and Carbohydrate counting sheet, Arm Chair Exercise handout  If problems or questions, patient to contact team via:  Phone and Email  Future DSME appointment: - 3-4 months

## 2016-10-04 NOTE — Patient Instructions (Signed)
Plan:  Aim for 2 Carb Choices per meal (30 grams) +/- 1 either way  Aim for 0-1 Carbs per snack if hungry  Include protein in moderation with your meals and snacks Continue reading food labels for Total Carbohydrate of foods Consider  increasing your activity level by doing some Arm Chair Exercises for 15 minutes twice daily and increase as tolerated Consider checking BG at alternate times per day  Continue taking medication Metformin as directed by MD

## 2016-10-13 ENCOUNTER — Ambulatory Visit
Admission: RE | Admit: 2016-10-13 | Discharge: 2016-10-13 | Disposition: A | Discharge status: Home / Self Care | Payer: Medicare Other | Source: Ambulatory Visit | Attending: Sports Medicine | Admitting: Sports Medicine

## 2016-10-13 DIAGNOSIS — M19072 Primary osteoarthritis, left ankle and foot: Secondary | ICD-10-CM

## 2016-11-07 ENCOUNTER — Ambulatory Visit (INDEPENDENT_AMBULATORY_CARE_PROVIDER_SITE_OTHER): Payer: Medicare Other | Admitting: Allergy and Immunology

## 2016-11-07 ENCOUNTER — Encounter: Payer: Self-pay | Admitting: Allergy and Immunology

## 2016-11-07 VITALS — BP 128/80 | HR 80 | Temp 98.0°F | Resp 14 | Ht 63.58 in | Wt 179.8 lb

## 2016-11-07 DIAGNOSIS — J3089 Other allergic rhinitis: Secondary | ICD-10-CM | POA: Diagnosis not present

## 2016-11-07 DIAGNOSIS — R430 Anosmia: Secondary | ICD-10-CM

## 2016-11-07 DIAGNOSIS — K219 Gastro-esophageal reflux disease without esophagitis: Secondary | ICD-10-CM | POA: Diagnosis not present

## 2016-11-07 DIAGNOSIS — J324 Chronic pansinusitis: Secondary | ICD-10-CM | POA: Diagnosis not present

## 2016-11-07 MED ORDER — AMOXICILLIN-POT CLAVULANATE 875-125 MG PO TABS: ORAL_TABLET | ORAL | 0 refills | Status: DC

## 2016-11-07 MED ORDER — EPINEPHRINE 0.3 MG/0.3ML IJ SOAJ: INTRAMUSCULAR | 3 refills | Status: DC

## 2016-11-07 MED ORDER — RANITIDINE HCL 300 MG PO TABS: 300.0000 mg | ORAL_TABLET | Freq: Every day | ORAL | 5 refills | Status: DC

## 2016-11-07 NOTE — Progress Notes (Signed)
Dear Dr. Laurann Montana,  Thank you for referring Kristen Murray to the [facility] on 11/07/2016.   Below is a summation of this patient's evaluation and recommendations.  Thank you for your referral. I will keep you informed about this patient's response to treatment.   If you have any questions please do not hesitate to contact me.   Sincerely,  Jiles Prows, MD [facility]   ______________________________________________________________________    NEW PATIENT NOTE  Referring Provider: [name], MD Primary Provider: Irven Shelling, MD Date of office visit: 11/07/2016    Subjective:   Chief Complaint:  Kristen Murray (DOB: 19-Jun-1946) is a 71 y.o. female who presents to the clinic on 11/07/2016 with a chief complaint of Allergic Rhinitis  (Runny nose, sneezing, itchy eyes.) and Other (Food allergy) .     HPI: Kristen Murray presents to this clinic in evaluation of persistent respiratory tract symptoms.  Apparently she has a long history of allergic rhinitis for which she has seen both Dr. Keturah Barre and Dr. Fredderick Phenix. Dr. Keturah Barre attempted to start her on a course of immunotherapy but apparently she had some form of reaction secondary to this therapy and never completed a course of immunotherapy. Dr. Creed Copper has given her multiple medications in the past which have not really resulted in satisfactory response. She complains of sneezing and nose blowing and lots of nasal congestion occurring on a perennial basis with some flare during the spring and fall season. She has complete loss of ability to smell and taste over the course of the past 2 years ever since she was given a sample of Astelin to use. She apparently did have a CT scan of her sinuses back in 2006 which did not identify any significant abnormality. Skin testing performed by Dr. Fredderick Phenix did demonstrate hypersensitivity against  various pollens.  Kristen Murray recently visited with Dr. Ernesto Rutherford for some right ear issues and was given what sounds like several courses of antibiotics. She still continues to have some right ear pain in the preauricular area without any associated systemic or constitutional symptoms or obvious trigger.  Kristen Murray also has an issue with postnasal drip and throat clearing and intermittent raspy voice especially over the course of the past year. She was treated for reflux in the past with omeprazole while she was under the care of Dr. Earle Gell but omeprazole gave rise to some ill-defined GI issue and she is now using Tums as needed. She only uses Tums about twice a month for mid chest burning. She did end up in the emergency room in the winter of 2017 in evaluation of a chest pain episode which apparently was secondary to reflux.  Kristen Murray also has an issue with consumption of specific foods. She gets mouth and throat irritation when consuming walnuts and pecans and pine nuts. When she eats avocados it gives rise to GI upset. She does apparently have an EpiPen.  3 weeks ago Kristen Murray developed an upper respiratory tract infection associated with cough and headache. She has had a daily headache ever since that event and still continues to have cough and her nose is congested. She was started on a nasal spray at that point in time.  Apparently latex contact gives rise to a rash.  Past Medical History:  Diagnosis Date  . Borderline abnormal TFTs    as a teen  . Diabetes mellitus without complication ([place])   .  E. coli UTI 10/05/12   [facility]; resistant only to Septra DS & tetracycline  . GERD (gastroesophageal reflux disease)   . Headache(784.0)   . HSV-1 infection   . HTN (hypertension)   . Hyperglycemia   . Hyperlipidemia   . Meningioma ([place])   . Nephrolithiasis    Dr Amalia Hailey, [facility], Hx of [3][  . Polyp of colon   . Radiculopathy     Past Surgical History:  Procedure Laterality Date  . ABDOMINAL  HYSTERECTOMY     TAH/BSO --fibroids, no cancer  . BALLOON DILATION N/A 07/20/2014   Procedure: BALLOON DILATION;  Surgeon: Garlan Fair, MD;  Location: [facility];  Service: Endoscopy;  Laterality: N/A;  . BREAST BIOPSY     Right  . CARPAL TUNNEL RELEASE     & trigger thumb RUE; Dr Alphonzo Cruise  . COLONOSCOPY W/ POLYPECTOMY  07/2004   Neg 2011; Dr Earle Gell  . CYSTOSCOPY  11/2009   Neg  . ESOPHAGOGASTRODUODENOSCOPY N/A 07/20/2014   Procedure: ESOPHAGOGASTRODUODENOSCOPY (EGD);  Surgeon: Garlan Fair, MD;  Location: [facility];  Service: Endoscopy;  Laterality: N/A;  . FLEXIBLE BRONCHOSCOPY W/ UPPER ENDOSCOPY     neg  . KNEE ARTHROSCOPY     Left  . ROTATOR CUFF REPAIR     R shoulder  . TONSILLECTOMY    . TRIGGER FINGER RELEASE Right    Right Thumb    Allergies as of 11/07/2016      Reactions   Codeine    Hives Because of a history of documented adverse serious drug reaction;Medi Alert bracelet  is recommended   Propoxyphene Hcl Swelling, Rash   rash   Latex    rash   Macrodantin [nitrofurantoin Macrocrystal] Other (See Comments)   Numbness, aching all over,    Other    Pine nuts & walnuts throat congestion. No documented angioedema.   Ciprofloxacin    ? Nausea (Note: she takes this when on mission trips)   Tramadol    ? nausea      Medication List      aspirin 325 MG EC tablet Take 325 mg by mouth daily.   cephALEXin 500 MG capsule Commonly known as:  KEFLEX Take 1 capsule (500 mg total) by mouth 2 (two) times daily.   fluticasone 50 MCG/ACT nasal spray Commonly known as:  FLONASE Place into the nose.   freestyle lancets Check blood sugar once daily as directed DX:790.29 (FREESTYLE FREEDOM LITE)   glucose blood test strip Commonly known as:  FREESTYLE LITE CHECK BLOOD SUGAR ONCE DAILY AS DIRECTED.  DX:790.29   metFORMIN 500 MG tablet Commonly known as:  GLUCOPHAGE 1 daily at the completion of largest meal   metroNIDAZOLE 0.75 % vaginal  gel Commonly known as:  METROGEL Place 1 Applicatorful vaginally 2 (two) times daily.   multivitamin tablet Take 1 tablet by mouth daily.   MYRBETRIQ 25 MG Tb24 tablet Generic drug:  mirabegron ER Take 25 mg by mouth daily.   NONFORMULARY OR COMPOUNDED ITEM Estradiol .02% 1 ML Prefilled Applicator Sig: apply vaginally twice a week #90 Day Supply with 4 refills   phenazopyridine 200 MG tablet Commonly known as:  PYRIDIUM Take 1 tablet (200 mg total) by mouth 3 (three) times daily as needed for pain.   trimethoprim 100 MG tablet Commonly known as:  TRIMPEX Take 100 mg by mouth daily.   valACYclovir 1000 MG tablet Commonly known as:  VALTREX TK 1 T PO D   verapamil 180  MG 24 hr capsule Commonly known as:  VERELAN PM Take 180 mg by mouth at bedtime.       Review of systems negative except as noted in HPI / PMHx or noted below:  Review of Systems  Constitutional: Negative.   HENT: Negative.   Eyes: Negative.   Respiratory: Negative.   Cardiovascular: Negative.   Gastrointestinal: Negative.   Genitourinary: Negative.   Musculoskeletal: Negative.   Skin: Negative.   Neurological: Negative.   Endo/Heme/Allergies: Negative.   Psychiatric/Behavioral: Negative.     Family History  Problem Relation Age of Onset  . Alcohol abuse Mother   . Diabetes Maternal Aunt   . Diabetes Maternal Uncle   . Diabetes Maternal Grandmother   . Tuberculosis Maternal Uncle   . Other Father     unknown  . Coronary artery disease Neg Hx     Social History   Social History  . Marital status: Married    Spouse name: N/A  . Number of children: N/A  . Years of education: N/A   Occupational History  . Aeronautical engineer   Social History Main Topics  . Smoking status: Never Smoker  . Smokeless tobacco: Never Used  . Alcohol use No  . Drug use: No  . Sexual activity: Yes    Birth control/ protection: Post-menopausal     Comment: 1st intercourse- 20  partners  - 1   Other Topics Concern  . Not on file   Social History Narrative   Regular Exercise-no          Environmental and Social history  Lives in a house with a dry environment, no animals located inside the household, carpeting in the bedroom, plastic on the bed and pillow, and no smokers located inside the household. She is a Holiday representative at [employer]  Objective:   Vitals:   11/07/16 0959  BP: 128/80  Pulse: 80  Resp: 14  Temp: 98 F (36.7 C)   Height: 5' 3.58" (161.5 cm) Weight: 179 lb 12.8 oz (81.6 kg)  Physical Exam  Constitutional: She is well-developed, well-nourished, and in no distress.  Slight nasal voice, throat clearing, slight cough  HENT:  Head: Normocephalic. Head is without right periorbital erythema and without left periorbital erythema.  Right Ear: Tympanic membrane, external ear and ear canal normal.  Left Ear: Tympanic membrane, external ear and ear canal normal.  Nose: Mucosal edema present. No rhinorrhea.  Mouth/Throat: Oropharynx is clear and moist and mucous membranes are normal. No oropharyngeal exudate.  Eyes: Conjunctivae and lids are normal. Pupils are equal, round, and reactive to light.  Neck: Trachea normal. No tracheal deviation present. No thyromegaly present.  Cardiovascular: Normal rate, regular rhythm, S1 normal, S2 normal and normal heart sounds.   No murmur heard. Pulmonary/Chest: Effort normal. No stridor. No tachypnea. No respiratory distress. She has no wheezes. She has no rales. She exhibits no tenderness.  Abdominal: Soft. She exhibits no distension and no mass. There is no hepatosplenomegaly. There is no tenderness. There is no rebound and no guarding.  Musculoskeletal: She exhibits no edema or tenderness.  Lymphadenopathy:       Head (right side): No tonsillar adenopathy present.       Head (left side): No tonsillar adenopathy present.    She has no cervical adenopathy.    She has no axillary adenopathy.  Neurological: She is  alert. Gait normal.  Skin: No rash noted. She is not diaphoretic. No erythema. No pallor. Nails show  no clubbing.  Psychiatric: Mood and affect normal.    Diagnostics: Allergy skin tests were performed. She demonstrated hypersensitivity to pollens including grasses and weeds and trees.  Results of a sinus CT scan performed 06/12/2005 identified the following:  The paranasal sinuses are well pneumatized.  There is no evidence of sinusitis.  The nasal airway is patent and the nasal turbinates are normal in size and position.    Assessment and Plan:    1. Anosmia   2. Chronic pansinusitis   3. Other allergic rhinitis   4. LPRD (laryngopharyngeal reflux disease)     1. Allergen avoidance measures  2. Treat and prevent inflammation:   A. continue Flonase one-2 sprays each nostril one time per day  3. Treat persistent infection:   A. Augmentin 875 one tablet twice a day for the next 20 days  4. Treat LPR:   A. consolidate all caffeine and chocolate use  B. ranitidine 300 mg 1 time per day  C. can add OTC Tums if needed  5. If needed:   A. nasal saline  B. OTC antihistamine  C. Epi-Pen  6. May require repeat sinus CT scan as last scan was 2006  7. Return to clinic in 3 weeks or earlier if problem  I think that Kristen Murray has a combination of insults to her respiratory tract including atopic disease and reflux-induced respiratory disease and possibly a component of chronic sinusitis accounting for her persistent anosmia and decreased ability to taste. We'll have her utilize the plan mentioned above which makes an attempt to address each one of these issues. I would like to see her back in this clinic in approximately 3 weeks. I think she will require a repeat CT scan of her sinuses if indeed she does still remains symptomatic regarding this issue as her last CT scan was in 2006 at a point in time in which she could smell and taste.  Jiles Prows, MD [facility]

## 2016-11-07 NOTE — Patient Instructions (Addendum)
  1. Allergen avoidance measures  2. Treat and prevent inflammation:   A. continue Flonase one-2 sprays each nostril one time per day  3. Treat persistent infection:   A. Augmentin 875 one tablet twice a day for the next 20 days  4. Treat LPR:   A. consolidate all caffeine and chocolate use  B. ranitidine 300 mg 1 time per day  C. can add OTC Tums if needed  5. If needed:   A. nasal saline  B. OTC antihistamine  C. Epi-Pen  6. May require repeat CT sinus scan as last scan was 2006  7. Return to clinic in 3 weeks or earlier if problem

## 2016-11-08 ENCOUNTER — Encounter: Payer: Self-pay | Admitting: Gynecology

## 2016-11-08 ENCOUNTER — Ambulatory Visit (INDEPENDENT_AMBULATORY_CARE_PROVIDER_SITE_OTHER): Payer: Medicare Other | Admitting: Gynecology

## 2016-11-08 VITALS — BP 130/70 | Ht 65.0 in | Wt 178.0 lb

## 2016-11-08 DIAGNOSIS — Z79890 Hormone replacement therapy (postmenopausal): Secondary | ICD-10-CM

## 2016-11-08 DIAGNOSIS — Z01419 Encounter for gynecological examination (general) (routine) without abnormal findings: Secondary | ICD-10-CM

## 2016-11-08 MED ORDER — NONFORMULARY OR COMPOUNDED ITEM
4 refills | Status: DC
Start: 2016-11-08 — End: 2019-09-30

## 2016-11-08 MED ORDER — FLUCONAZOLE 150 MG PO TABS: 150.0000 mg | ORAL_TABLET | Freq: Once | ORAL | 0 refills | Status: AC

## 2016-11-08 NOTE — Progress Notes (Signed)
Kristen Murray May 11, 1946 [id number]   History:    71 y.o.  for annual gyn exam with no complaints today. Her PCP is Dr. Laurann Montana who is been doing her blood work. She still has not had her shingles vaccine. Her flu vaccine is up-to-date.Patient with past history of total abdominal hysterectomy bilateral salpingo- oophorectomy.Patient with no prior history of abnormal Pap smears. As a result of her vaginal atrophy she had been prescribed topical vaginal estrogen 0.02% to apply twice a week. Her PCP is treating her for hypertension and type 2 diabetes. She had a normal colonoscopy in 2011. Her bone density study was normal 2017.  Past medical history,surgical history, family history and social history were all reviewed and documented in the EPIC chart.  Gynecologic History Patient's last menstrual period was 07/30/1990. Contraception: status post hysterectomy Last Pap: 2016. Results were: normal Last mammogram: 2017. Results were: normal  Obstetric History OB History  Gravida Para Term Preterm AB Living  3 3 3     3   SAB TAB Ectopic Multiple Live Births          3    # Outcome Date GA Lbr Len/2nd Weight Sex Delivery Anes PTL Lv  3 Term     F Vag-Spont  N LIV  2 Term     F Vag-Spont  N LIV  1 Term     F Vag-Spont  N LIV       ROS: A ROS was performed and pertinent positives and negatives are included in the history.  GENERAL: No fevers or chills. HEENT: No change in vision, no earache, sore throat or sinus congestion. NECK: No pain or stiffness. CARDIOVASCULAR: No chest pain or pressure. No palpitations. PULMONARY: No shortness of breath, cough or wheeze. GASTROINTESTINAL: No abdominal pain, nausea, vomiting or diarrhea, melena or bright red blood per rectum. GENITOURINARY: No urinary frequency, urgency, hesitancy or dysuria. MUSCULOSKELETAL: No joint or muscle pain, no back pain, no recent trauma. DERMATOLOGIC: No rash, no itching, no lesions. ENDOCRINE: No polyuria, polydipsia, no heat  or cold intolerance. No recent change in weight. HEMATOLOGICAL: No anemia or easy bruising or bleeding. NEUROLOGIC: No headache, seizures, numbness, tingling or weakness. PSYCHIATRIC: No depression, no loss of interest in normal activity or change in sleep pattern.     Exam: chaperone present  BP 130/70   Ht 5\' 5"  (1.651 m)   Wt 178 lb (80.7 kg)   LMP 07/30/1990   BMI 29.62 kg/m   Body mass index is 29.62 kg/m.  General appearance : Well developed well nourished female. No acute distress HEENT: Eyes: no retinal hemorrhage or exudates,  Neck supple, trachea midline, no carotid bruits, no thyroidmegaly Lungs: Clear to auscultation, no rhonchi or wheezes, or rib retractions  Heart: Regular rate and rhythm, no murmurs or gallops Breast:Examined in sitting and supine position were symmetrical in appearance, no palpable masses or tenderness,  no skin retraction, no nipple inversion, no nipple discharge, no skin discoloration, no axillary or supraclavicular lymphadenopathy Abdomen: no palpable masses or tenderness, no rebound or guarding Extremities: no edema or skin discoloration or tenderness  Pelvic:  Bartholin, Urethra, Skene Glands: Within normal limits             Vagina: No gross lesions or discharge  Cervix: Absent  Uterus  absent  Adnexa  Without masses or tenderness  Anus and perineum  normal   Rectovaginal  normal sphincter tone without palpated masses or tenderness  Hemoccult PCP provides     Assessment/Plan:  71 y.o. female for annual exam has done well with her vaginal estrogen twice a week. Reports no vaginal bleeding. Prescription for her to coat to her local pharmacy for the shingles vaccine was provided as well as literature information. We discussed importance of calcium vitamin D and weightbearing exercises for osteoporosis prevention. Pap smear no longer needed according to the new guidelines. Her PCP has done her blood work. She is reminded to schedule  her mammogram for early this year.   Terrance Mass MD, 11:22 AM 11/08/2016

## 2016-11-08 NOTE — Patient Instructions (Signed)
1. What is shingles? Shingles is a painful skin rash, often with blisters. It is also called Herpes Zoster, or just Zoster. A shingles rash usually appears on one side of the face or body and lasts from 2 to 4 weeks. Its main symptom is pain, which can be quite severe. Other symptoms of shingles can include fever, headache, chills and upset stomach. Very rarely, a shingles infection can lead to pneumonia, hearing problems, blindness, brain inflammation (encephalitis) or death. For about 1 person in 5, severe pain can continue even long after the rash clears up. This is called post-herpetic neuralgia. Shingles is caused by the Varicella Zoster virus, the same virus that causes chickenpox. Only someone who has had chickenpox-or, rarely, has gotten chickenpox vaccine-can get shingles. The virus stays in your body, and can cause shingles many years later. You can't catch shingles from another person with shingles. However, a person who has never had chickenpox (or chicken pox vaccine) could get chickenpox from someone with shingles. This is not very common. Shingles is far more common in people 37 years of age and older than in younger people. It is also more common in people whose immune systems are weakened because of a disease such as cancer, or drugs such as steroids or chemotherapy. At least 1 million people a year in the [place] get shingles. 2. Shingles vaccine A vaccine for shingles was licensed in 0947. In clinical trials, the vaccine reduced the risk of shingles by 50%. It can also reduce pain in people who still get shingles after being vaccinated. A single dose of shingles vaccine is recommended for adults 23 years of age and older. 3. Some people should not get shingles vaccine or should wait A person should not get shingles vaccine who:  has ever had a life-threatening allergic reaction to gelatin, the antibiotic neomycin, or any other component of shingles vaccine. Tell your doctor if  you have any severe allergies.  has a weakened immune system because of current:  AIDS or another disease that affects the immune system,  treatment with drugs that affect the immune system, such as prolonged use of high-dose steroids,  cancer treatment such as radiation or chemotherapy,  cancer affecting the bone marrow or lymphatic system, such as leukemia or lymphoma.  is pregnant, or might be pregnant. Women should not become pregnant until at least 4 weeks after getting shingles vaccine. Someone with a minor acute illness, such as a cold, may be vaccinated. But anyone with a moderate or severe acute illness should usually wait until they recover before get ting the vaccine. This includes anyone with a temperature of 101.3 F or higher. 4. What are the risks from shingles vaccine? A vaccine, like any medicine, could possibly cause serious problems, such as severe allergic reactions. However, the risk of a vaccine causing serious harm, or death, is extremely small. No serious problems have been identified with shingles vaccine. Mild problems  Redness, soreness, swelling, or itching at the site of the injection (about 1 person in 3).  Headache (about 1 person in 68). Like all vaccines, shingles vaccine is being closely monitored for unusual or severe problems. 5. What if there is a serious reaction? What should I look for?  Look for anything that concerns you, such as signs of a severe allergic reaction, very high fever, or behavior changes. Signs of a severe allergic reaction can include hives, swelling of the face and throat, difficulty breathing, a fast heartbeat, dizziness, and weakness. These  would start a few minutes to a few hours after the vaccination. What should I do?  If you think it is a severe allergic reaction or other emergency that can't wait, call 9-1-1 or get the person to the nearest hospital. Otherwise, call your doctor.  Afterward, the reaction should be  reported to the Vaccine Adverse Event Reporting System (VAERS). Your doctor might file this report, or you can do it yourself through the VAERS web site at www.vaers.SamedayNews.es or by calling 7272086971. VAERS is only for reporting reactions. They do not give medical advice. 6. How can I learn more?  Ask your doctor.  Call your local or state health department.  Contact the Centers for Disease Control and Prevention (CDC):  Call 737-308-6224(1-800-CDC-INFO) or  Visit CDC's website at http://hunter.com/ CDC Vaccine Information Statement (VIS) Shingles Vaccine (08/04/2008) This information is not intended to replace advice given to you by your health care provider. Make sure you discuss any questions you have with your health care provider. Document Released: 08/13/2006 Document Revised: 06/14/2016 Document Reviewed: 02/05/2013 Elsevier Interactive Patient Education  2017 [employer].

## 2016-12-05 ENCOUNTER — Ambulatory Visit: Payer: Medicare Other | Admitting: Allergy and Immunology

## 2016-12-06 ENCOUNTER — Encounter: Payer: Self-pay | Admitting: Allergy and Immunology

## 2016-12-06 ENCOUNTER — Ambulatory Visit (INDEPENDENT_AMBULATORY_CARE_PROVIDER_SITE_OTHER): Payer: Medicare Other | Admitting: Allergy and Immunology

## 2016-12-06 VITALS — BP 124/60 | HR 84 | Resp 16

## 2016-12-06 DIAGNOSIS — R430 Anosmia: Secondary | ICD-10-CM | POA: Diagnosis not present

## 2016-12-06 DIAGNOSIS — J3089 Other allergic rhinitis: Secondary | ICD-10-CM | POA: Diagnosis not present

## 2016-12-06 DIAGNOSIS — J324 Chronic pansinusitis: Secondary | ICD-10-CM | POA: Diagnosis not present

## 2016-12-06 DIAGNOSIS — K219 Gastro-esophageal reflux disease without esophagitis: Secondary | ICD-10-CM | POA: Diagnosis not present

## 2016-12-06 NOTE — Patient Instructions (Addendum)
  1. Allergen avoidance measures  2. Continue to Treat and prevent inflammation:   A. continue Flonase one-2 sprays each nostril one time per day  B. start montelukast 10 mg tablet once a day  3. Continue to Treat LPR:   A. continue to consolidate all caffeine and chocolate use  B. increase ranitidine 300 mg 2 times per day  C. can add OTC Tums if needed  4. If needed:   A. nasal saline  B. OTC antihistamine  C. Epi-Pen  5. Use another 20 days of Augmentin 875 one tablet twice a day. CT Scan?  6. Return to clinic in 12 weeks or earlier if problem

## 2016-12-06 NOTE — Progress Notes (Signed)
Follow-up Note  Referring Provider: [name], MD Primary Provider: Irven Shelling, MD Date of Office Visit: 12/06/2016  Subjective:   Kristen Murray (DOB: 27-Feb-1946) is a 71 y.o. female who returns to [facility] on 12/06/2016 in re-evaluation of the following:  HPI: Kristen Murray presents to this clinic in evaluation of her persistent respiratory tract symptoms that were addressed on 11/07/2016 at which time we gave her a therapeutic plan that included treatment of possible chronic sinusitis as well as addressing the inflammatory component of her respiratory tract disease and addressing reflux-induced respiratory disease.  She is definitely better at this point in time. She has less throat clearing and less postnasal drip and less cough. She has less issues with her nose. She may notice some occasional bleeding from her nostrils over the course of the past week while using her nasal fluticasone twice a day.  However, she still cannot smell or taste. This is been a long-standing issue of several years duration. She apparently had an MRI performed in 2015 which identified no significant abnormality of her sinus cavities.  She remains away from eating walnuts and pecans and Pine nuts and avocado.  Allergies as of 12/06/2016      Reactions   Codeine    Hives Because of a history of documented adverse serious drug reaction;Medi Alert bracelet  is recommended   Propoxyphene Hcl Swelling, Rash   rash   Latex    rash   Macrodantin [nitrofurantoin Macrocrystal] Other (See Comments)   Numbness, aching all over,    Other    Pine nuts & walnuts throat congestion. No documented angioedema.   Ciprofloxacin    ? Nausea (Note: she takes this when on mission trips)   Tramadol    ? nausea      Medication List      aspirin 325 MG EC tablet Take 325 mg by mouth daily.   EPINEPHrine 0.3 mg/0.3 mL Soaj injection Commonly known as:  EPI-PEN Use as directed for  life-threatening allergic reactions.   fluticasone 50 MCG/ACT nasal spray Commonly known as:  FLONASE Place 1 spray into the nose 2 (two) times daily.   freestyle lancets Check blood sugar once daily as directed DX:790.29 (FREESTYLE FREEDOM LITE)   glucose blood test strip Commonly known as:  FREESTYLE LITE CHECK BLOOD SUGAR ONCE DAILY AS DIRECTED.  DX:790.29   metFORMIN 500 MG tablet Commonly known as:  GLUCOPHAGE 1 daily at the completion of largest meal   metroNIDAZOLE 0.75 % vaginal gel Commonly known as:  METROGEL Place 1 Applicatorful vaginally 2 (two) times daily.   multivitamin tablet Take 1 tablet by mouth daily.   MYRBETRIQ 25 MG Tb24 tablet Generic drug:  mirabegron ER Take 25 mg by mouth daily.   NONFORMULARY OR COMPOUNDED ITEM Estradiol .02% 1 ML Prefilled Applicator Sig: apply vaginally twice a week #90 Day Supply with 4 refills   phenazopyridine 200 MG tablet Commonly known as:  PYRIDIUM Take 1 tablet (200 mg total) by mouth 3 (three) times daily as needed for pain.   ranitidine 150 MG tablet Commonly known as:  ZANTAC Take 150 mg by mouth 2 (two) times daily.   trimethoprim 100 MG tablet Commonly known as:  TRIMPEX Take 100 mg by mouth daily.   valACYclovir 1000 MG tablet Commonly known as:  VALTREX TK 1 T PO D   verapamil 180 MG 24 hr capsule Commonly known as:  VERELAN PM Take 180 mg by mouth  at bedtime.       Past Medical History:  Diagnosis Date  . Borderline abnormal TFTs    as a teen  . Diabetes mellitus without complication ([place])   . E. coli UTI 10/05/12   [facility]; resistant only to Septra DS & tetracycline  . GERD (gastroesophageal reflux disease)   . Headache(784.0)   . HSV-1 infection   . HTN (hypertension)   . Hyperglycemia   . Hyperlipidemia   . Meningioma ([place])   . Nephrolithiasis    Dr Amalia Hailey, [facility], Hx of [3][  . Polyp of colon   . Radiculopathy     Past Surgical History:  Procedure Laterality Date  . ABDOMINAL  HYSTERECTOMY     TAH/BSO --fibroids, no cancer  . BALLOON DILATION N/A 07/20/2014   Procedure: BALLOON DILATION;  Surgeon: Garlan Fair, MD;  Location: [facility];  Service: Endoscopy;  Laterality: N/A;  . BREAST BIOPSY     Right  . CARPAL TUNNEL RELEASE     & trigger thumb RUE; Dr Alphonzo Cruise  . COLONOSCOPY W/ POLYPECTOMY  07/2004   Neg 2011; Dr Earle Gell  . CYSTOSCOPY  11/2009   Neg  . ESOPHAGOGASTRODUODENOSCOPY N/A 07/20/2014   Procedure: ESOPHAGOGASTRODUODENOSCOPY (EGD);  Surgeon: Garlan Fair, MD;  Location: [facility];  Service: Endoscopy;  Laterality: N/A;  . FLEXIBLE BRONCHOSCOPY W/ UPPER ENDOSCOPY     neg  . KNEE ARTHROSCOPY     Left  . ROTATOR CUFF REPAIR     R shoulder  . TONSILLECTOMY    . TRIGGER FINGER RELEASE Right    Right Thumb    Review of systems negative except as noted in HPI / PMHx or noted below:  Review of Systems  Constitutional: Negative.   HENT: Negative.   Eyes: Negative.   Respiratory: Negative.   Cardiovascular: Negative.   Gastrointestinal: Negative.   Genitourinary: Negative.   Musculoskeletal: Negative.   Skin: Negative.   Neurological: Negative.   Endo/Heme/Allergies: Negative.   Psychiatric/Behavioral: Negative.      Objective:   Vitals:   12/06/16 1024  BP: 124/60  Pulse: 84  Resp: 16          Physical Exam  Constitutional: She is well-developed, well-nourished, and in no distress.  HENT:  Head: Normocephalic.  Right Ear: Tympanic membrane, external ear and ear canal normal.  Left Ear: Tympanic membrane, external ear and ear canal normal.  Nose: Nose normal. No mucosal edema or rhinorrhea.  Mouth/Throat: Uvula is midline, oropharynx is clear and moist and mucous membranes are normal. No oropharyngeal exudate.  Eyes: Conjunctivae are normal.  Neck: Trachea normal. No tracheal tenderness present. No tracheal deviation present. No thyromegaly present.  Cardiovascular: Normal rate, regular rhythm, S1 normal,  S2 normal and normal heart sounds.   No murmur heard. Pulmonary/Chest: Breath sounds normal. No stridor. No respiratory distress. She has no wheezes. She has no rales.  Musculoskeletal: She exhibits no edema.  Lymphadenopathy:       Head (right side): No tonsillar adenopathy present.       Head (left side): No tonsillar adenopathy present.    She has no cervical adenopathy.  Neurological: She is alert. Gait normal.  Skin: No rash noted. She is not diaphoretic. No erythema. Nails show no clubbing.  Psychiatric: Mood and affect normal.    Diagnostics: Results of a MRI performed at [facility] on 10/09/2014 identified the following:  .Calvarium/skull base: No focal marrow replacing lesion suggestive of neoplasm. .Orbits:  Grossly unremarkable. .Paranasal sinuses: Imaged portions clear. .Brain: No evidence of acute abnormality.Multiple T2 and FLAIR signal focal hyperintensities are present within the subcortical and deep white matter.. No evidence of acute ischemia.No acute hemorrhage or hydrocephalus. 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 No abnormal enhancement to suggest neoplasm, abscess, or mass lesion.Grossly normal flow-related signal in the major intracranial arteries and dural sinuses.  Assessment and Plan:   1. Other allergic rhinitis   2. Chronic pansinusitis   3. Anosmia   4. LPRD (laryngopharyngeal reflux disease)     1. Allergen avoidance measures  2. Continue to Treat and prevent inflammation:   A. continue Flonase one-2 sprays each nostril one time per day  B. start montelukast 10 mg tablet once a day  3.  Continue to Treat LPR:   A. continue to consolidate all caffeine and chocolate use  B. increase ranitidine 300 mg 2 times per day  C. can add OTC Tums if needed  4. If needed:   A. nasal saline  B. OTC antihistamine  C. Epi-Pen  5. Use another 20 days of Augmentin 875 one tablet twice a day. CT Scan?  6. Return to clinic in 12 weeks or earlier if problem  Ebelin is doing better but yet still remains with significant anosmia and to some degree some drainage in her throat. I'm going to start her on a leukotriene modifier in addition to her nasal steroid as noted above. As well, I've asked her to decrease her nasal steroid to 1 time per day at any point in time in which she does notice that she's developing any irritation or bleeding in her nose. To address the issue of LPR I will increase her ranitidine to twice a day at this point and hopefully she will not develop any side effects on this high dose and this will actually make a difference regarding what appears to be some lingering LPR. I will give her an additional 20 days of Augmentin to completely cover the possibility of chronic sinusitis and make a decision about whether or not she will require an imaging procedure of her airway in further investigation of anosmia at that point in time.  Allena Katz, MD Allergy / Immunology [place]

## 2016-12-08 ENCOUNTER — Other Ambulatory Visit: Payer: Self-pay | Admitting: Allergy and Immunology

## 2016-12-27 ENCOUNTER — Encounter: Payer: Self-pay | Admitting: Gynecology

## 2017-01-03 ENCOUNTER — Encounter: Payer: Medicare Other | Attending: Internal Medicine | Admitting: *Deleted

## 2017-01-03 DIAGNOSIS — E119 Type 2 diabetes mellitus without complications: Secondary | ICD-10-CM | POA: Insufficient documentation

## 2017-01-03 DIAGNOSIS — Z713 Dietary counseling and surveillance: Secondary | ICD-10-CM | POA: Diagnosis not present

## 2017-01-03 DIAGNOSIS — E1149 Type 2 diabetes mellitus with other diabetic neurological complication: Secondary | ICD-10-CM

## 2017-01-03 NOTE — Patient Instructions (Signed)
Plan:  Aim for 2-3 Carb Choices per meal (30 - 45 grams)  Aim for 0-1 Carbs per snack if hungry  Include protein in moderation with your meals and snacks Continue reading food labels for Total Carbohydrate of foods Consider  increasing your activity level by doing some Arm Chair Exercises for 15 minutes twice daily and increase as tolerated Consider checking BG at alternate times per day  Continue taking medication Metformin as directed by MD

## 2017-01-03 NOTE — Progress Notes (Signed)
Diabetes Self-Management Education  Visit Type:  Follow-up  Appt. Start Time: 1000 Appt. End Time: 1030  01/03/2017  [name], identified by name and date of birth, is a 71 y.o. female with a diagnosis of Diabetes:  .  She states she is comfortable with carb counting and continues to work on appropriate portion sizes more often. She is still limited in her activity options due to foot and back pain. She is checking her BG fasting most of the time, but attempts to check post meal occasionally.   ASSESSMENT  Height 5\' 5"  (1.651 m), weight 176 lb 9.6 oz (80.1 kg),  Body mass index is 29.39 kg/m.       Diabetes Self-Management Education - 01/03/17 1132      Health Coping   How would you rate your overall health? Good     Psychosocial Assessment   Patient Belief/Attitude about Diabetes Motivated to manage diabetes   Patient Concerns Weight Control;Glycemic Control;Nutrition/Meal planning     Subsequent Visit   Since your last visit have you continued or begun to take your medications as prescribed? Yes   Since your last visit have you experienced any weight changes? Loss   Weight Loss (lbs) 2   Since your last visit, are you checking your blood glucose at least once a day? Yes      Learning Objective:  Patient will have a greater understanding of diabetes self-management. Patient education plan is to attend individual and/or group sessions per assessed needs and concerns.   Plan:   Patient Instructions  Plan:  Aim for 2-3 Carb Choices per meal (30 - 45 grams)  Aim for 0-1 Carbs per snack if hungry  Include protein in moderation with your meals and snacks Continue reading food labels for Total Carbohydrate of foods Consider  increasing your activity level by doing some Arm Chair Exercises for 15 minutes twice daily and increase as tolerated Consider checking BG at alternate times per day  Continue taking medication Metformin as directed by MD  Expected Outcomes:      Education material provided: Food label handouts and Snack sheet, recipe of fresh fruit dessert we looked up together.  If problems or questions, patient to contact team via:  Phone and Email  Future DSME appointment: -  4 months, after her mission trip in July

## 2017-01-30 ENCOUNTER — Telehealth: Payer: Self-pay | Admitting: Allergy and Immunology

## 2017-01-30 MED ORDER — MONTELUKAST SODIUM 10 MG PO TABS: 10.0000 mg | ORAL_TABLET | Freq: Every day | ORAL | 5 refills | Status: DC

## 2017-01-30 NOTE — Telephone Encounter (Signed)
Patient stated that she was having some ongoing sinus symptoms and she was doing all that was advised at last visit. Upon reviewing the AVS with her we found that the montelukast had not been sent in. I will send that in and I have also advised to do an OTC antihistamine (Allegra, Zyrtec, Claritin, Xyzal, etc) Patient will get montelukast and see how that makes her feel.

## 2017-01-30 NOTE — Telephone Encounter (Signed)
Pt would like to talk with nurse about medications. [phone].

## 2017-01-30 NOTE — Telephone Encounter (Signed)
Please have patient try the montelukast and if no better within a week then we need to image her upper airway with a limited sinus CT scan to look at sinusitis.

## 2017-02-06 ENCOUNTER — Telehealth: Payer: Self-pay | Admitting: Pediatrics

## 2017-02-06 NOTE — Telephone Encounter (Signed)
Please call patient back at home phone regarding billing concern. Pt said she will be available today only, will be in the hospital tomorrow.

## 2017-02-06 NOTE — Telephone Encounter (Signed)
Explained the balance went to the ded - she will send pmt

## 2017-02-28 ENCOUNTER — Encounter: Payer: Self-pay | Admitting: Allergy and Immunology

## 2017-02-28 ENCOUNTER — Ambulatory Visit (INDEPENDENT_AMBULATORY_CARE_PROVIDER_SITE_OTHER): Payer: Medicare Other | Admitting: Allergy and Immunology

## 2017-02-28 VITALS — BP 120/68 | HR 87 | Temp 98.1°F | Resp 12 | Ht 64.0 in | Wt 174.4 lb

## 2017-02-28 DIAGNOSIS — J3089 Other allergic rhinitis: Secondary | ICD-10-CM

## 2017-02-28 DIAGNOSIS — J324 Chronic pansinusitis: Secondary | ICD-10-CM | POA: Diagnosis not present

## 2017-02-28 DIAGNOSIS — T7805XD Anaphylactic reaction due to tree nuts and seeds, subsequent encounter: Secondary | ICD-10-CM

## 2017-02-28 DIAGNOSIS — K219 Gastro-esophageal reflux disease without esophagitis: Secondary | ICD-10-CM

## 2017-02-28 NOTE — Progress Notes (Signed)
Follow-up Note  Referring Provider: [name], MD Primary Provider: Irven Shelling, MD Date of Office Visit: 02/28/2017  Subjective:   Kristen Murray (DOB: 03/06/1946) is a 71 y.o. female who returns to the [facility] on 02/28/2017 in re-evaluation of the following:  HPI: Samai returns to this clinic in reevaluation of her chronic sinusitis, allergic rhinitis, LPR, and food allergy. I last saw her in this clinic in February 2018 at which point in time we gave her a prolonged course of antibiotics and continue to have her aggressively treat reflux and upper airway inflammation.  She is significantly better. She really has no problems with her nose at this point in time. She has no nasal congestion. She still can't smell or taste very well but that has been a long-standing issue. She has had no issues with her ears.  Her throat is much better at this point in time. She still has some throat clearing but this has decreased dramatically.  She remains away from eating walnuts and pecans and pine nuts and avocado.  Allergies as of 02/28/2017      Reactions   Codeine    Hives Because of a history of documented adverse serious drug reaction;Medi Alert bracelet  is recommended   Propoxyphene Hcl Swelling, Rash   rash   Latex    rash   Macrodantin [nitrofurantoin Macrocrystal] Other (See Comments)   Numbness, aching all over,    Other    Pine nuts & walnuts throat congestion. No documented angioedema.   Ciprofloxacin    ? Nausea (Note: she takes this when on mission trips)   Tramadol    ? nausea      Medication List      aspirin 325 MG EC tablet Take 325 mg by mouth daily.   augmented betamethasone dipropionate 0.05 % cream Commonly known as:  DIPROLENE-AF   cephALEXin 500 MG capsule Commonly known as:  KEFLEX   EPINEPHrine 0.3 mg/0.3 mL Soaj injection Commonly known as:  EPI-PEN Use as directed for life-threatening allergic reactions.     fluticasone 50 MCG/ACT nasal spray Commonly known as:  FLONASE Place 1 spray into the nose 2 (two) times daily.   freestyle lancets Check blood sugar once daily as directed DX:790.29 (FREESTYLE FREEDOM LITE)   glucose blood test strip Commonly known as:  FREESTYLE LITE CHECK BLOOD SUGAR ONCE DAILY AS DIRECTED.  DX:790.29   metFORMIN 500 MG tablet Commonly known as:  GLUCOPHAGE 1 daily at the completion of largest meal   metroNIDAZOLE 0.75 % vaginal gel Commonly known as:  METROGEL Place 1 Applicatorful vaginally 2 (two) times daily.   montelukast 10 MG tablet Commonly known as:  SINGULAIR Take 1 tablet (10 mg total) by mouth at bedtime.   multivitamin tablet Take 1 tablet by mouth daily.   MYRBETRIQ 25 MG Tb24 tablet Generic drug:  mirabegron ER Take 25 mg by mouth daily.   NONFORMULARY OR COMPOUNDED ITEM Estradiol .02% 1 ML Prefilled Applicator Sig: apply vaginally twice a week #90 Day Supply with 4 refills   phenazopyridine 200 MG tablet Commonly known as:  PYRIDIUM Take 1 tablet (200 mg total) by mouth 3 (three) times daily as needed for pain.   ranitidine 150 MG tablet Commonly known as:  ZANTAC Take 150 mg by mouth 2 (two) times daily.   ranitidine 300 MG tablet Commonly known as:  ZANTAC   tiZANidine 4 MG tablet Commonly known as:  ZANAFLEX   trimethoprim  100 MG tablet Commonly known as:  TRIMPEX Take 100 mg by mouth daily.   valACYclovir 1000 MG tablet Commonly known as:  VALTREX TK 1 T PO D   verapamil 180 MG CR tablet Commonly known as:  CALAN-SR       Past Medical History:  Diagnosis Date  . Borderline abnormal TFTs    as a teen  . Diabetes mellitus without complication ([place])   . E. coli UTI 10/05/12   [facility]; resistant only to Septra DS & tetracycline  . GERD (gastroesophageal reflux disease)   . Headache(784.0)   . HSV-1 infection   . HTN (hypertension)   . Hyperglycemia   . Hyperlipidemia   . Meningioma ([place])   .  Nephrolithiasis    Dr Amalia Hailey, [facility], Hx of [3][  . Polyp of colon   . Radiculopathy     Past Surgical History:  Procedure Laterality Date  . ABDOMINAL HYSTERECTOMY     TAH/BSO --fibroids, no cancer  . BALLOON DILATION N/A 07/20/2014   Procedure: BALLOON DILATION;  Surgeon: Garlan Fair, MD;  Location: [facility];  Service: Endoscopy;  Laterality: N/A;  . BREAST BIOPSY     Right  . CARPAL TUNNEL RELEASE     & trigger thumb RUE; Dr Alphonzo Cruise  . COLONOSCOPY W/ POLYPECTOMY  07/2004   Neg 2011; Dr Earle Gell  . CYSTOSCOPY  11/2009   Neg  . ESOPHAGOGASTRODUODENOSCOPY N/A 07/20/2014   Procedure: ESOPHAGOGASTRODUODENOSCOPY (EGD);  Surgeon: Garlan Fair, MD;  Location: [facility];  Service: Endoscopy;  Laterality: N/A;  . FLEXIBLE BRONCHOSCOPY W/ UPPER ENDOSCOPY     neg  . KNEE ARTHROSCOPY     Left  . ROTATOR CUFF REPAIR     R shoulder  . TONSILLECTOMY    . TRIGGER FINGER RELEASE Right    Right Thumb    Review of systems negative except as noted in HPI / PMHx or noted below:  Review of Systems  Constitutional: Negative.   HENT: Negative.   Eyes: Negative.   Respiratory: Negative.   Cardiovascular: Negative.   Gastrointestinal: Negative.   Genitourinary: Negative.   Musculoskeletal: Negative.   Skin: Negative.   Neurological: Negative.   Endo/Heme/Allergies: Negative.   Psychiatric/Behavioral: Negative.      Objective:   Vitals:   02/28/17 1041  BP: 120/68  Pulse: 87  Resp: 12  Temp: 98.1 F (36.7 C)   Height: 5\' 4"  (162.6 cm)  Weight: 174 lb 6.4 oz (79.1 kg)   Physical Exam  Constitutional: She is well-developed, well-nourished, and in no distress.  Throat clearing  HENT:  Head: Normocephalic.  Right Ear: Tympanic membrane, external ear and ear canal normal.  Left Ear: Tympanic membrane, external ear and ear canal normal.  Nose: Nose normal. No mucosal edema or rhinorrhea.  Mouth/Throat: Uvula is midline, oropharynx is clear and moist and  mucous membranes are normal. No oropharyngeal exudate.  Eyes: Conjunctivae are normal.  Neck: Trachea normal. No tracheal tenderness present. No tracheal deviation present. No thyromegaly present.  Cardiovascular: Normal rate, regular rhythm, S1 normal, S2 normal and normal heart sounds.   No murmur heard. Pulmonary/Chest: Breath sounds normal. No stridor. No respiratory distress. She has no wheezes. She has no rales.  Musculoskeletal: She exhibits no edema.  Lymphadenopathy:       Head (right side): No tonsillar adenopathy present.       Head (left side): No tonsillar adenopathy present.    She has no cervical adenopathy.  Neurological: She  is alert. Gait normal.  Skin: No rash noted. She is not diaphoretic. No erythema. Nails show no clubbing.  Psychiatric: Mood and affect normal.    Diagnostics: none   Assessment and Plan:   1. Other allergic rhinitis   2. Chronic pansinusitis   3. LPRD (laryngopharyngeal reflux disease)   4. 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 Continue to perform Allergen avoidance measures  2. Continue to Treat and prevent inflammation:   A. continue Flonase one-2 sprays each nostril one time per day  B. montelukast 10 mg tablet once a day  3. Continue to Treat LPR:   A. continue to consolidate all caffeine and chocolate use  B. ranitidine 300 mg 2 times per day  C. can add OTC Tums if needed  4. If needed:   A. nasal saline  B. OTC antihistamine  C. Epi-Pen  5. Replaced throat clearing with swallowing maneuver  6. Return to clinic in 12 weeks or earlier if problem  Audrielle is doing very well at this point in time and I am going to continue to have her use anti-inflammatory agents for her airway as well as continue to treat reflux pretty aggressively as noted above. I have asked her to replace her throat clearing maneuver with her swallowing maneuver as her throat clearing is leading to some degree of  inflammation of her mid airway. She will obviously remain away from foods that give rise to an allergic reaction. I will see her back in this clinic in 12 weeks or earlier if there is a problem.  Allena Katz, MD Allergy / Immunology [place]

## 2017-02-28 NOTE — Patient Instructions (Signed)
  1. Continue to perform Allergen avoidance measures  2. Continue to Treat and prevent inflammation:   A. continue Flonase one-2 sprays each nostril one time per day  B. montelukast 10 mg tablet once a day  3. Continue to Treat LPR:   A. continue to consolidate all caffeine and chocolate use  B. ranitidine 300 mg 2 times per day  C. can add OTC Tums if needed  4. If needed:   A. nasal saline  B. OTC antihistamine  C. Epi-Pen  5. Replaced throat clearing with swallowing maneuver  6. Return to clinic in 12 weeks or earlier if problem

## 2017-03-01 MED ORDER — FLUTICASONE PROPIONATE 50 MCG/ACT NA SUSP: 1.0000 | Freq: Two times a day (BID) | NASAL | 5 refills | Status: DC

## 2017-03-14 ENCOUNTER — Encounter: Payer: Self-pay | Admitting: Gynecology

## 2017-03-14 ENCOUNTER — Telehealth: Payer: Self-pay | Admitting: Pediatrics

## 2017-03-14 NOTE — Telephone Encounter (Signed)
Pt claims she paid on bill received. Pls call her back. Thanks

## 2017-03-14 NOTE — Telephone Encounter (Signed)
Called pt & advised her that we rec'd her payment of $183 on 03-06-17 -kt

## 2017-05-22 ENCOUNTER — Ambulatory Visit (INDEPENDENT_AMBULATORY_CARE_PROVIDER_SITE_OTHER): Payer: Medicare Other | Admitting: Allergy and Immunology

## 2017-05-22 ENCOUNTER — Encounter: Payer: Self-pay | Admitting: Allergy and Immunology

## 2017-05-22 VITALS — BP 132/68 | HR 85 | Resp 16

## 2017-05-22 DIAGNOSIS — J3089 Other allergic rhinitis: Secondary | ICD-10-CM | POA: Diagnosis not present

## 2017-05-22 DIAGNOSIS — K219 Gastro-esophageal reflux disease without esophagitis: Secondary | ICD-10-CM | POA: Diagnosis not present

## 2017-05-22 DIAGNOSIS — R430 Anosmia: Secondary | ICD-10-CM | POA: Diagnosis not present

## 2017-05-22 DIAGNOSIS — T7805XD Anaphylactic reaction due to tree nuts and seeds, subsequent encounter: Secondary | ICD-10-CM | POA: Diagnosis not present

## 2017-05-22 NOTE — Patient Instructions (Addendum)
  1. Continue to perform Allergen avoidance measures  2. Continue to Treat and prevent inflammation:   A. continue Flonase one-2 sprays each nostril one time per day  B. montelukast 10 mg tablet once a day  3. Continue to Treat LPR:   A. continue to consolidate all caffeine and chocolate use  B. ranitidine 300 mg 2 times per day  C. can add OTC Tums if needed  4. If needed:   A. nasal saline  B. OTC antihistamine  C. Epi-Pen  5. Continue to throat clearing with swallowing maneuver  6. Return to clinic in December 2018 or earlier if problem  7. Obtain fall flu vaccine

## 2017-05-22 NOTE — Progress Notes (Signed)
Follow-up Note  Referring Provider: [name], MD Primary Provider: [name], MD Date of Office Visit: 05/22/2017  Subjective:   Kristen Murray (DOB: 10/26/1946) is a 71 y.o. female who returns to the [facility] on 05/22/2017 in re-evaluation of the following:  HPI: Kristen Murray returns to this clinic in evaluation of her allergic rhinitis and LPR and history of chronic sinusitis and history of food allergy. Her last visit to this clinic was May 2018.  Overall she thinks that her respiratory tract is doing very well without any significant problems revolving around her nose or her throat. She has minimal throat clearing and makes a concerted effort to not throat clear. She is not consuming any caffeine and she has been very good about consistently using her ranitidine and her montelukast and her nasal steroid.  She still remains away from tree nuts and pine nuts and avocado.  She has been having problems with headaches for greater than 4 weeks for which she saw her neurologist in June who performed an MRI of her brain. She is scheduled to revisit with that neurologist in 1 week.  Allergies as of 05/22/2017      Reactions   Codeine    Hives Because of a history of documented adverse serious drug reaction;Medi Alert bracelet  is recommended   Propoxyphene Hcl Swelling, Rash   rash   Latex    rash   Macrodantin [nitrofurantoin Macrocrystal] Other (See Comments)   Numbness, aching all over,    Other    Pine nuts & walnuts throat congestion. No documented angioedema.   Ciprofloxacin    ? Nausea (Note: she takes this when on mission trips)   Tramadol    ? nausea      Medication List      aspirin 325 MG EC tablet Take 325 mg by mouth daily.   EPINEPHrine 0.3 mg/0.3 mL Soaj injection Commonly known as:  EPI-PEN Use as directed for life-threatening allergic reactions.   fluticasone 50 MCG/ACT nasal spray Commonly known as:  FLONASE Place 1 spray into both  nostrils 2 (two) times daily.   freestyle lancets Check blood sugar once daily as directed DX:790.29 (FREESTYLE FREEDOM LITE)   glucose blood test strip Commonly known as:  FREESTYLE LITE CHECK BLOOD SUGAR ONCE DAILY AS DIRECTED.  DX:790.29   metFORMIN 500 MG tablet Commonly known as:  GLUCOPHAGE 1 daily at the completion of largest meal   metroNIDAZOLE 0.75 % vaginal gel Commonly known as:  METROGEL Place 1 Applicatorful vaginally 2 (two) times daily.   montelukast 10 MG tablet Commonly known as:  SINGULAIR Take 1 tablet (10 mg total) by mouth at bedtime.   multivitamin tablet Take 1 tablet by mouth daily.   MYRBETRIQ 25 MG Tb24 tablet Generic drug:  mirabegron ER Take 25 mg by mouth daily.   NONFORMULARY OR COMPOUNDED ITEM Estradiol .02% 1 ML Prefilled Applicator Sig: apply vaginally twice a week #90 Day Supply with 4 refills   phenazopyridine 200 MG tablet Commonly known as:  PYRIDIUM Take 1 tablet (200 mg total) by mouth 3 (three) times daily as needed for pain.   ranitidine 300 MG tablet Commonly known as:  ZANTAC   tiZANidine 4 MG tablet Commonly known as:  ZANAFLEX   trimethoprim 100 MG tablet Commonly known as:  TRIMPEX Take 100 mg by mouth daily.   valACYclovir 1000 MG tablet Commonly known as:  VALTREX TK 1 T PO D   verapamil 180  MG CR tablet Commonly known as:  CALAN-SR       Past Medical History:  Diagnosis Date  . Borderline abnormal TFTs    as a teen  . Diabetes mellitus without complication ([place])   . E. coli UTI 10/05/12   [facility]; resistant only to Septra DS & tetracycline  . GERD (gastroesophageal reflux disease)   . Headache(784.0)   . HSV-1 infection   . HTN (hypertension)   . Hyperglycemia   . Hyperlipidemia   . Meningioma ([place])   . Nephrolithiasis    Dr Amalia Hailey, [facility], Hx of [3][  . Polyp of colon   . Radiculopathy     Past Surgical History:  Procedure Laterality Date  . ABDOMINAL HYSTERECTOMY     TAH/BSO --fibroids, no  cancer  . BALLOON DILATION N/A 07/20/2014   Procedure: BALLOON DILATION;  Surgeon: Garlan Fair, MD;  Location: [facility];  Service: Endoscopy;  Laterality: N/A;  . BREAST BIOPSY     Right  . CARPAL TUNNEL RELEASE     & trigger thumb RUE; Dr Alphonzo Cruise  . COLONOSCOPY W/ POLYPECTOMY  07/2004   Neg 2011; Dr Earle Gell  . CYSTOSCOPY  11/2009   Neg  . ESOPHAGOGASTRODUODENOSCOPY N/A 07/20/2014   Procedure: ESOPHAGOGASTRODUODENOSCOPY (EGD);  Surgeon: Garlan Fair, MD;  Location: [facility];  Service: Endoscopy;  Laterality: N/A;  . FLEXIBLE BRONCHOSCOPY W/ UPPER ENDOSCOPY     neg  . KNEE ARTHROSCOPY     Left  . ROTATOR CUFF REPAIR     R shoulder  . TONSILLECTOMY    . TRIGGER FINGER RELEASE Right    Right Thumb    Review of systems negative except as noted in HPI / PMHx or noted below:  Review of Systems  Constitutional: Negative.   HENT: Negative.   Eyes: Negative.   Respiratory: Negative.   Cardiovascular: Negative.   Gastrointestinal: Negative.   Genitourinary: Negative.   Musculoskeletal: Negative.   Skin: Negative.   Neurological: Negative.   Endo/Heme/Allergies: Negative.   Psychiatric/Behavioral: Negative.      Objective:   Vitals:   05/22/17 1052  BP: 132/68  Pulse: 85  Resp: 16          Physical Exam  Constitutional: She is well-developed, well-nourished, and in no distress.  HENT:  Head: Normocephalic.  Right Ear: Tympanic membrane, external ear and ear canal normal.  Left Ear: Tympanic membrane, external ear and ear canal normal.  Nose: Nose normal. No mucosal edema or rhinorrhea.  Mouth/Throat: Uvula is midline, oropharynx is clear and moist and mucous membranes are normal. No oropharyngeal exudate.  Eyes: Conjunctivae are normal.  Neck: Trachea normal. No tracheal tenderness present. No tracheal deviation present. No thyromegaly present.  Cardiovascular: Normal rate, regular rhythm, S1 normal, S2 normal and normal heart sounds.   No  murmur heard. Pulmonary/Chest: Breath sounds normal. No stridor. No respiratory distress. She has no wheezes. She has no rales.  Musculoskeletal: She exhibits no edema.  Lymphadenopathy:       Head (right side): No tonsillar adenopathy present.       Head (left side): No tonsillar adenopathy present.    She has no cervical adenopathy.  Neurological: She is alert. Gait normal.  Skin: No rash noted. She is not diaphoretic. No erythema. Nails show no clubbing.  Psychiatric: Mood and affect normal.    Diagnostics: Results of an MRI performed 20 April 2017 identified the following:  Calvarium/skull base: No focal marrow replacing lesion suggestive of neoplasm.  Orbits: Grossly unremarkable. Paranasal sinuses: Imaged portions clear. Brain: No evidence of acute abnormality. Slight increased size of the extra-axial dural based enhancing lesion along the right aspect of the tentorium relative to 10/09/2014 measuring 12 x 11 x 11 mm on today's examination, series 9 image 26, series 10 image 6. This lesion exerts mass effect upon the adjacent parenchyma and adjacent transverse sinus with increased narrowing of the transverse sinus in comparison to 2015. The transverse sinus does remain patent. No evidence of acute ischemia. No acute hemorrhage or hydrocephalus. Grossly normal flow-related signal in the major intracranial arteries and dural sinuses. Similar appearance of the dural based calcifications in comparison to prior exam. Similar tiny T1 hyperintensity within the interpedicular fossa compatible with a tiny lipoma and unchanged. 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    Assessment and Plan:   1. Other allergic rhinitis   2. LPRD (laryngopharyngeal reflux disease)   3. 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 Anosmia      1. Continue to perform Allergen avoidance measures  2. Continue to Treat and prevent inflammation:   A. continue Flonase one-2 sprays each nostril one time per day  B. montelukast 10 mg tablet once a day  3. Continue to Treat LPR:   A. continue to consolidate all caffeine and chocolate use  B. ranitidine 300 mg 2 times per day  C. can add OTC Tums if needed  4. If needed:   A. nasal saline  B. OTC antihistamine  C. Epi-Pen  5. Continue to throat clearing with swallowing maneuver  6. Return to clinic in December 2018 or earlier if problem  7. Obtain fall flu vaccine  Overall Vyctoria is really doing quite well and she will continue to use anti-inflammatory agents for her respiratory tract and therapy directed against reflux to address her LPR and I will see her back in this clinic in December 2018 or earlier if there is a problem. She will follow-up with her neurologist concerning her headache issue.  Allena Katz, MD Allergy / Immunology [place]

## 2017-05-23 ENCOUNTER — Encounter: Payer: Medicare Other | Attending: Internal Medicine | Admitting: *Deleted

## 2017-05-23 DIAGNOSIS — Z713 Dietary counseling and surveillance: Secondary | ICD-10-CM | POA: Insufficient documentation

## 2017-05-23 DIAGNOSIS — E663 Overweight: Secondary | ICD-10-CM | POA: Insufficient documentation

## 2017-05-23 DIAGNOSIS — E119 Type 2 diabetes mellitus without complications: Secondary | ICD-10-CM | POA: Insufficient documentation

## 2017-05-23 DIAGNOSIS — E1149 Type 2 diabetes mellitus with other diabetic neurological complication: Secondary | ICD-10-CM

## 2017-05-23 NOTE — Progress Notes (Signed)
Diabetes Self-Management Education  Visit Type:  Follow-up  Appt. Start Time: 11.0 Appt. End Time: 1200  05/23/2017  Kristen Murray, identified by name and date of birth, is a 71 y.o. female with a diagnosis of Diabetes:  .  She states she has just returned from a 9 day mission trip to the [place] She was very active there, she did not bring her meter with her. She states she started the Arm Chair exercises after our last visit in March and does them almost every day for 15-30 minutes. She continues to be comfortable with carb counting but has questions about incorporating with her husband;s cardiac diet.   ASSESSMENT  Height 5\' 5"  (1.651 m), weight 176 lb 1.6 oz (79.9 kg), last menstrual period 07/30/1990. Body mass index is 29.3 kg/m.       Diabetes Self-Management Education - 05/23/17 1712      Health Coping   How would you rate your overall health? Good     Psychosocial Assessment   Patient Belief/Attitude about Diabetes Motivated to manage diabetes   Self-care barriers None   Patient Concerns Nutrition/Meal planning;Glycemic Control     Complications   How often do you check your blood sugar? 3-4 times / week     Exercise   Exercise Type Light (walking / raking leaves)  has added Arm Chair exercises at home   How many days per week to you exercise? 6   How many minutes per day do you exercise? 20   Total minutes per week of exercise 120     Patient Education   Nutrition management  Carbohydrate counting;Food label reading, portion sizes and measuring food.   Physical activity and exercise  Role of exercise on diabetes management, blood pressure control and cardiac health.   Monitoring Identified appropriate SMBG and/or A1C goals.     Individualized Goals (developed by patient)   Nutrition General guidelines for healthy choices and portions discussed   Physical Activity Exercise 3-5 times per week   Medications take my medication as prescribed   Monitoring  test blood glucose pre and post meals as discussed   Reducing Risk examine blood glucose patterns     Patient Self-Evaluation of Goals - Patient rates self as meeting previously set goals (% of time)   Nutrition >75%   Physical Activity >75%   Medications >75%   Monitoring >75%   Problem Solving >75%   Reducing Risk >75%   Health Coping >75%     Post-Education Assessment   Patient understands incorporating nutritional management into lifestyle. Demonstrates understanding / competency   Patient undertands incorporating physical activity into lifestyle. Demonstrates understanding / competency   Patient understands using medications safely. Demonstrates understanding / competency     Outcomes   Program Status Completed     Subsequent Visit   Since your last visit have you continued or begun to take your medications as prescribed? Yes   Since your last visit have you experienced any weight changes? Loss   Weight Loss (lbs) 3      Learning Objective:  Patient will have a greater understanding of diabetes self-management. Patient education plan is to attend individual and/or group sessions per assessed needs and concerns.   Plan:   Patient Instructions  Plan:  Aim for 2-3 Carb Choices per meal (30 - 45 grams)  Aim for 0-1 Carbs per snack if hungry  Include protein in moderation with your meals and snacks Continue reading food labels for Total  Carbohydrate of foods Continue with your activity level by doing some Arm Chair Exercises and walking as tolerated Continue checking BG at alternate times per day  Continue taking medication Metformin as directed by MD  Expected Outcomes:  Demonstrated interest in learning. Expect positive outcomes  Education material provided: Meal plan card and Carbohydrate counting sheet , Plate Method handout  If problems or questions, patient to contact team via:  Phone  Future DSME appointment: - PRN

## 2017-05-23 NOTE — Patient Instructions (Signed)
Plan:  Aim for 2-3 Carb Choices per meal (30 - 45 grams)  Aim for 0-1 Carbs per snack if hungry  Include protein in moderation with your meals and snacks Continue reading food labels for Total Carbohydrate of foods Continue with your activity level by doing some Arm Chair Exercises and walking as tolerated Continue checking BG at alternate times per day  Continue taking medication Metformin as directed by MD

## 2017-06-15 DIAGNOSIS — Z79899 Other long term (current) drug therapy: Secondary | ICD-10-CM | POA: Insufficient documentation

## 2017-07-29 ENCOUNTER — Emergency Department (HOSPITAL_BASED_OUTPATIENT_CLINIC_OR_DEPARTMENT_OTHER)
Admission: EM | Admit: 2017-07-29 | Discharge: 2017-07-29 | Disposition: A | Discharge status: Other Acute Inpatient Hospital | Payer: Medicare Other | Attending: Emergency Medicine | Admitting: Emergency Medicine

## 2017-07-29 ENCOUNTER — Emergency Department (HOSPITAL_BASED_OUTPATIENT_CLINIC_OR_DEPARTMENT_OTHER): Payer: Medicare Other

## 2017-07-29 ENCOUNTER — Encounter (HOSPITAL_BASED_OUTPATIENT_CLINIC_OR_DEPARTMENT_OTHER): Payer: Self-pay | Admitting: Emergency Medicine

## 2017-07-29 DIAGNOSIS — Z91040 Latex allergy status: Secondary | ICD-10-CM | POA: Diagnosis not present

## 2017-07-29 DIAGNOSIS — Z7982 Long term (current) use of aspirin: Secondary | ICD-10-CM | POA: Diagnosis not present

## 2017-07-29 DIAGNOSIS — H05012 Cellulitis of left orbit: Secondary | ICD-10-CM

## 2017-07-29 DIAGNOSIS — Z7984 Long term (current) use of oral hypoglycemic drugs: Secondary | ICD-10-CM | POA: Diagnosis not present

## 2017-07-29 DIAGNOSIS — I10 Essential (primary) hypertension: Secondary | ICD-10-CM | POA: Diagnosis not present

## 2017-07-29 DIAGNOSIS — H5712 Ocular pain, left eye: Secondary | ICD-10-CM | POA: Diagnosis present

## 2017-07-29 DIAGNOSIS — Z79899 Other long term (current) drug therapy: Secondary | ICD-10-CM | POA: Insufficient documentation

## 2017-07-29 DIAGNOSIS — E119 Type 2 diabetes mellitus without complications: Secondary | ICD-10-CM | POA: Insufficient documentation

## 2017-07-29 LAB — CBC WITH DIFFERENTIAL/PLATELET
BASOS ABS: 0 10*3/uL (ref 0.0–0.1)
BASOS PCT: 0 %
EOS ABS: 0 10*3/uL (ref 0.0–0.7)
Eosinophils Relative: 0 %
HCT: 37.4 % (ref 36.0–46.0)
Hemoglobin: 12.3 g/dL (ref 12.0–15.0)
Lymphocytes Relative: 36 %
Lymphs Abs: 1.7 10*3/uL (ref 0.7–4.0)
MCH: 29.1 pg (ref 26.0–34.0)
MCHC: 32.9 g/dL (ref 30.0–36.0)
MCV: 88.4 fL (ref 78.0–100.0)
MONO ABS: 0.4 10*3/uL (ref 0.1–1.0)
MONOS PCT: 9 %
NEUTROS ABS: 2.6 10*3/uL (ref 1.7–7.7)
NEUTROS PCT: 55 %
Platelets: 188 10*3/uL (ref 150–400)
RBC: 4.23 MIL/uL (ref 3.87–5.11)
RDW: 15.1 % (ref 11.5–15.5)
WBC: 4.7 10*3/uL (ref 4.0–10.5)

## 2017-07-29 LAB — BASIC METABOLIC PANEL
ANION GAP: 6 (ref 5–15)
BUN: 16 mg/dL (ref 6–20)
CALCIUM: 9.2 mg/dL (ref 8.9–10.3)
CO2: 29 mmol/L (ref 22–32)
CREATININE: 0.83 mg/dL (ref 0.44–1.00)
Chloride: 100 mmol/L — ABNORMAL LOW (ref 101–111)
Glucose, Bld: 268 mg/dL — ABNORMAL HIGH (ref 65–99)
Potassium: 3.8 mmol/L (ref 3.5–5.1)
SODIUM: 135 mmol/L (ref 135–145)

## 2017-07-29 MED ORDER — IOPAMIDOL (ISOVUE-300) INJECTION 61%
100.0000 mL | Freq: Once | INTRAVENOUS | Status: AC | PRN
  Administered 2017-07-29: 75 mL via INTRAVENOUS

## 2017-07-29 MED ORDER — VANCOMYCIN HCL IN DEXTROSE 1-5 GM/200ML-% IV SOLN
1000.0000 mg | Freq: Once | INTRAVENOUS | Status: AC
  Administered 2017-07-29: 1000 mg via INTRAVENOUS
  Filled 2017-07-29: qty 200

## 2017-07-29 MED ORDER — TETRACAINE HCL 0.5 % OP SOLN
1.0000 [drp] | Freq: Once | OPHTHALMIC | Status: DC
  Filled 2017-07-29: qty 4

## 2017-07-29 MED ORDER — PIPERACILLIN-TAZOBACTAM 3.375 G IVPB 30 MIN
3.3750 g | Freq: Once | INTRAVENOUS | Status: AC
  Administered 2017-07-29: 3.375 g via INTRAVENOUS
  Filled 2017-07-29 (×2): qty 50

## 2017-07-29 MED ORDER — SODIUM CHLORIDE 0.9 % IV BOLUS (SEPSIS)
1000.0000 mL | Freq: Once | INTRAVENOUS | Status: AC
  Administered 2017-07-29: 1000 mL via INTRAVENOUS

## 2017-07-29 MED ORDER — HYDROMORPHONE HCL 1 MG/ML IJ SOLN
0.5000 mg | Freq: Once | INTRAMUSCULAR | Status: AC
  Administered 2017-07-29: 0.5 mg via INTRAVENOUS
  Filled 2017-07-29: qty 1

## 2017-07-29 MED ORDER — ONDANSETRON HCL 4 MG/2ML IJ SOLN
4.0000 mg | Freq: Once | INTRAMUSCULAR | Status: AC
  Administered 2017-07-29: 4 mg via INTRAVENOUS
  Filled 2017-07-29: qty 2

## 2017-07-29 NOTE — ED Provider Notes (Signed)
Medical screening examination/treatment/procedure(s) were conducted as a shared visit with non-physician practitioner(s) and myself.  I personally evaluated the patient during the encounter.   EKG Interpretation None     patient has developed left eye pain and swelling.She started at the end of August. First she had redness in the right eye than the left side. Reason she was started on clindamycin. There has not been improvement. Her eyes become extremely swollen and she is no longer able to open it. On examination patient is alert and nontoxic. She has large swelling of the leftlid and orbital area. With retracting the lid (which is painful and difficult for the patient) patient has severe chemosis that only allows for a small pinpoint tunnel to the pupil. All of the iris is obscured. IV antibiotics are initiated. I agree with plan of transfer to [facility] for specialty ophthalmologic evaluation and treatment.   Charlesetta Shanks, MD 08/03/17 [phone]

## 2017-07-29 NOTE — ED Notes (Signed)
Pt on auto VS  

## 2017-07-29 NOTE — ED Notes (Signed)
[facility] notified of transport to [facility] @ 1243.  Spoke with Clarise Cruz

## 2017-07-29 NOTE — ED Triage Notes (Signed)
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 The patient has swelling and reddness to her left eye - eye is swollen shut.

## 2017-07-29 NOTE — ED Notes (Signed)
CT will need to wait for bun/creat per radiology protocol, hx DM and age >69

## 2017-07-29 NOTE — ED Notes (Signed)
Visual acuity deferred due to pt unable to open eyelid

## 2017-07-29 NOTE — ED Provider Notes (Signed)
[place] DEPT [facility] Provider Note   CSN: [id number] Arrival date & time: 07/29/17  5852     History   Chief Complaint Chief Complaint  Patient presents with  . Eye Problem    HPI Kristen Murray is a 71 y.o. female who presents emergency Department with chief complaint of left eye pain. This is an ongoing problem for 1 month. Patient took a cruise with her husband to [place] at the end of August 2018. She developed redness in her right eye that moved to her left eye and she thought she may have a viral conjunctivitis. She states that she has been seen 3 times for continued symptoms. She had previously been treating her eyes with lubricating tears. Last week she developed pain and redness around the left eye. She was seen 3 days ago at [facility] where she is an established patient and diagnosed with preseptal cellulitis. She has taken 7 doses of clindamycin with little improvement and worsening swelling. She complains of significant pain in the left eye. Over the past 2 days her eye has continued to swell she is no longer able to open the eye. She has no lash mattering and drainage from the left eye. She has a history of diabetes, multiple allergies, and is currently on a long-term steroid taper.    HPI  Past Medical History:  Diagnosis Date  . Borderline abnormal TFTs    as a teen  . Diabetes mellitus without complication ([place])   . E. coli UTI 10/05/12   [facility]; resistant only to Septra DS & tetracycline  . GERD (gastroesophageal reflux disease)   . Headache(784.0)   . HSV-1 infection   . HTN (hypertension)   . Hyperglycemia   . Hyperlipidemia   . Meningioma ([place])   . Nephrolithiasis    Dr Amalia Hailey, [facility], Hx of [3][  . Polyp of colon   . Radiculopathy     Patient Active Problem List   Diagnosis Date Noted  . Pain in the chest   . Chest pain at rest 11/26/2014  . Shingles 05/14/2014  . 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 Vaginal atrophy 10/16/2012  . Menopause  10/16/2012  . Chest pain, atypical 02/19/2012  . Type 2 diabetes mellitus with neurological manifestations, controlled ([place]) 02/19/2012  . COSTOCHONDRITIS 11/16/2010  . DISTURBANCES OF SENSATION OF SMELL AND TASTE 04/05/2010  . NEPHROLITHIASIS, HX OF 02/08/2010  . EDEMA 10/01/2009  . Brachial neuritis or radiculitis NOS 06/14/2009  . EXTERNAL HEMORRHOIDS 05/25/2009  . MUSCLE PAIN 05/17/2009  . HYPERLIPIDEMIA 04/23/2009  . ABNORMAL THYROID FUNCTION TESTS 04/23/2009  . MENINGIOMA 12/26/2007  . HEADACHE 12/26/2007  . RADICULOPATHY 04/02/2007  . HYPERTENSION 11/21/2006  . GERD 11/21/2006    Past Surgical History:  Procedure Laterality Date  . ABDOMINAL HYSTERECTOMY     TAH/BSO --fibroids, no cancer  . BALLOON DILATION N/A 07/20/2014   Procedure: BALLOON DILATION;  Surgeon: Garlan Fair, MD;  Location: [facility];  Service: Endoscopy;  Laterality: N/A;  . BREAST BIOPSY     Right  . CARPAL TUNNEL RELEASE     & trigger thumb RUE; Dr Alphonzo Cruise  . COLONOSCOPY W/ POLYPECTOMY  07/2004   Neg 2011; Dr Earle Gell  . CYSTOSCOPY  11/2009   Neg  . ESOPHAGOGASTRODUODENOSCOPY N/A 07/20/2014   Procedure: ESOPHAGOGASTRODUODENOSCOPY (EGD);  Surgeon: Garlan Fair, MD;  Location: [facility];  Service: Endoscopy;  Laterality: N/A;  . FLEXIBLE BRONCHOSCOPY W/ UPPER ENDOSCOPY     neg  .  KNEE ARTHROSCOPY     Left  . ROTATOR CUFF REPAIR     R shoulder  . TONSILLECTOMY    . TRIGGER FINGER RELEASE Right    Right Thumb    OB History    Gravida Para Term Preterm AB Living   3 3 3     3    SAB TAB Ectopic Multiple Live Births           3       Home Medications    Prior to Admission medications   Medication Sig Start Date End Date Taking? Authorizing Provider  aspirin 325 MG EC tablet Take 325 mg by mouth daily.    [provider]  EPINEPHrine 0.3 mg/0.3 mL IJ SOAJ injection Use as directed for life-threatening allergic reactions. 11/07/16   Kozlow, Donnamarie Poag, MD    fluticasone (FLONASE) 50 MCG/ACT nasal spray Place 1 spray into both nostrils 2 (two) times daily. 03/01/17   Kozlow, Donnamarie Poag, MD  glucose blood (FREESTYLE LITE) test strip CHECK BLOOD SUGAR ONCE DAILY AS DIRECTED.  DX:790.29 01/09/14   Hendricks Limes, MD  Lancets (FREESTYLE) lancets Check blood sugar once daily as directed DX:790.29 (FREESTYLE FREEDOM LITE) 01/09/14   Hendricks Limes, MD  metFORMIN (GLUCOPHAGE) 500 MG tablet 1 daily at the completion of largest meal 08/17/14   Hendricks Limes, MD  metroNIDAZOLE (METROGEL) 0.75 % vaginal gel Place 1 Applicatorful vaginally 2 (two) times daily. 08/09/15   Terrance Mass, MD  mirabegron ER (MYRBETRIQ) 25 MG TB24 tablet Take 25 mg by mouth daily.    [provider]  montelukast (SINGULAIR) 10 MG tablet Take 1 tablet (10 mg total) by mouth at bedtime. 01/30/17   Kozlow, Donnamarie Poag, MD  Multiple Vitamin (MULTIVITAMIN) tablet Take 1 tablet by mouth daily.      [provider]  NONFORMULARY OR COMPOUNDED ITEM Estradiol .02% 1 ML Prefilled Applicator Sig: apply vaginally twice a week #90 Day Supply with 4 refills 11/08/16   Terrance Mass, MD  phenazopyridine (PYRIDIUM) 200 MG tablet Take 1 tablet (200 mg total) by mouth 3 (three) times daily as needed for pain. 08/09/15   Terrance Mass, MD  ranitidine (ZANTAC) 300 MG tablet  12/08/16   [provider]  tiZANidine (ZANAFLEX) 4 MG tablet  11/28/16   [provider]  trimethoprim (TRIMPEX) 100 MG tablet Take 100 mg by mouth daily.    [provider]  valACYclovir (VALTREX) 1000 MG tablet TK 1 T PO D 01/06/16   [provider]  verapamil (CALAN-SR) 180 MG CR tablet  01/02/17   [provider]    Family History Family History  Problem Relation Age of Onset  . Alcohol abuse Mother   . Diabetes Maternal Aunt   . Diabetes Maternal Uncle   . Diabetes Maternal Grandmother   . Tuberculosis Maternal Uncle   . Other Father        unknown  .  Coronary artery disease Neg Hx     Social History Social History  Substance Use Topics  . Smoking status: Never Smoker  . Smokeless tobacco: Never Used  . Alcohol use No     Allergies   Codeine; Propoxyphene hcl; Latex; Macrodantin [nitrofurantoin macrocrystal]; Other; Ciprofloxacin; and Tramadol   Review of Systems Review of Systems  Ten systems reviewed and are negative for acute change, except as noted in the HPI.   Physical Exam Updated Vital Signs BP (!) 146/83 (BP Location: Left  Arm)   Pulse (!) 102   Temp 99.3 F (37.4 C) (Oral)   Resp 18   Ht 5\' 5"  (1.651 m)   Wt 78.9 kg (174 lb)   LMP 07/30/1990   SpO2 100%   BMI 28.96 kg/m   Physical Exam  Constitutional: She is oriented to person, place, and time. She appears well-developed and well-nourished. No distress.  HENT:  Head: Normocephalic and atraumatic.  Eyes: Conjunctivae are normal. No scleral icterus.  R eye with minimal injection. Pupil reactive. No pain in the left eye with indirect light. Left eye with redness around the orbit. Patient winces in pain with passive lid opening. Unable tot visualize the left eye because of swelling.  There is marked chemosis visible with minimal lid opening.  Neck: Normal range of motion.  Cardiovascular: Normal rate, regular rhythm and normal heart sounds.  Exam reveals no gallop and no friction rub.   No murmur heard. Pulmonary/Chest: Effort normal and breath sounds normal. No respiratory distress.  Abdominal: Soft. Bowel sounds are normal. She exhibits no distension and no mass. There is no tenderness. There is no guarding.  Neurological: She is alert and oriented to person, place, and time.  Skin: Skin is warm and dry. She is not diaphoretic.  Psychiatric: Her behavior is normal.  Nursing note and vitals reviewed.    ED Treatments / Results  Labs (all labs ordered are listed, but only abnormal results are displayed) Labs Reviewed - No data to display  EKG   EKG Interpretation None       Radiology No results found.  Procedures Procedures (including critical care time)  Medications Ordered in ED Medications - No data to display   Initial Impression / Assessment and Plan / ED Course  I have reviewed the triage vital signs and the nursing notes.  Pertinent labs & imaging results that were available during my care of the patient were reviewed by me and considered in my medical decision making (see chart for details).  Clinical Course as of Jul 29 1709  Sun Jul 29, 2017  1115 Patient CT scan shows a 2.5 cm fluid collection anterior to the left globe concerning for abscess. Patient will be started on pain physician. I have placed a consult to ophthalmology at [facility]] Margarita Mail, PA-C    Patient with abscess of the left eyelid. She has been given Vancocin Zosyn. She will be transferred to the emergency department at [facility]. I spoke with Dr. Inis Sizer of the ophthalmology service and I spoke with Dr. Henderson Baltimore in the emergency department who will accept the patient. I was unable to obtain IV pressures were inspected the pupillary reaction in the left eye secondary to the marked edema of the lid and marked ecchymosis. Patient appears safe for transfer at this time.  Final Clinical Impressions(s) / ED Diagnoses   Final diagnoses:  None    New Prescriptions New Prescriptions   No medications on file     Andrew, [place], PA-C 07/29/17 1716    Charlesetta Shanks, MD 08/03/17 [phone]

## 2017-07-29 NOTE — ED Notes (Signed)
ED Provider at bedside. 

## 2017-07-29 NOTE — ED Notes (Signed)
Patient transported to CT 

## 2017-08-20 DIAGNOSIS — H2513 Age-related nuclear cataract, bilateral: Secondary | ICD-10-CM | POA: Insufficient documentation

## 2017-10-02 ENCOUNTER — Encounter: Payer: Self-pay | Admitting: Allergy and Immunology

## 2017-10-02 ENCOUNTER — Ambulatory Visit (INDEPENDENT_AMBULATORY_CARE_PROVIDER_SITE_OTHER): Payer: Medicare Other | Admitting: Allergy and Immunology

## 2017-10-02 ENCOUNTER — Ambulatory Visit: Payer: Medicare Other | Admitting: Allergy and Immunology

## 2017-10-02 VITALS — BP 130/70 | HR 92 | Resp 16

## 2017-10-02 DIAGNOSIS — J3089 Other allergic rhinitis: Secondary | ICD-10-CM | POA: Diagnosis not present

## 2017-10-02 DIAGNOSIS — K219 Gastro-esophageal reflux disease without esophagitis: Secondary | ICD-10-CM | POA: Diagnosis not present

## 2017-10-02 DIAGNOSIS — T7805XD Anaphylactic reaction due to tree nuts and seeds, subsequent encounter: Secondary | ICD-10-CM

## 2017-10-02 MED ORDER — FLUTICASONE PROPIONATE 50 MCG/ACT NA SUSP: 1.0000 | Freq: Two times a day (BID) | NASAL | 5 refills | Status: DC

## 2017-10-02 MED ORDER — MONTELUKAST SODIUM 10 MG PO TABS: 10.0000 mg | ORAL_TABLET | Freq: Every day | ORAL | 5 refills | Status: DC

## 2017-10-02 MED ORDER — RANITIDINE HCL 300 MG PO TABS: 300.0000 mg | ORAL_TABLET | Freq: Every day | ORAL | 5 refills | Status: DC

## 2017-10-02 NOTE — Patient Instructions (Addendum)
  1. Continue to perform Allergen avoidance measures  2. Continue to Treat and prevent inflammation:   A. Restart Flonase one-2 sprays each nostril one time per day  B. Restart montelukast 10 mg tablet once a day  3. Continue to Treat LPR:   A. continue to consolidate all caffeine and chocolate use  B. ranitidine 300 mg 2 times per day  C. can add OTC Tums if needed  4. If needed:   A. nasal saline  B. OTC antihistamine  C. Epi-Pen  5. Continue to throat clearing with swallowing maneuver  6. Return to clinic in 6 months or earlier if problem

## 2017-10-02 NOTE — Progress Notes (Signed)
Follow-up Note  Referring Provider: [name], MD Primary Provider: [name], MD Date of Office Visit: 10/02/2017  Subjective:   Kristen Murray (DOB: 1946-09-20) is a 71 y.o. female who returns to the [facility] on 10/02/2017 in re-evaluation of the following:  HPI: Airica returns to this clinic in reevaluation of her allergic rhinitis and LPR and history of food allergy.  I have not seen her in this clinic since 22 May 2017.  She has had a very active 71-month interval.  She was diagnosed with temporal arteritis that gave rise to significant headaches requiring daily systemic steroid administration and she was also diagnosed with adenovirus induced conjunctivitis that required a fair amount of attention as well.  Currently she has tapered down her systemic steroid to 5 mg daily and she believes that she is doing quite well regarding her headache and it does sound as though her eye issue has improved significantly as well while she continues on steroid eyedrops.  She has had a little more throat clearing and a little more raspiness that has developed since this ordeal.  She continues to use ranitidine twice a day and does not consume caffeine or chocolate.  In the past she has seen Dr. Ernesto Rutherford who was performed rhinoscopic evaluation of her throat.  It does appear as though a lot of her recent throat issue is related to the administration of her steroids.  Her nose is doing relatively well at this point in time.  She has stopped her Flonase and her montelukast because of all the other issues that have arisen during the past 6 months.  She did obtain a flu vaccine this year.  She remains away from eating tree nuts, pine nuts, and avocado.  Allergies as of 10/02/2017      Reactions   Codeine    Hives Because of a history of documented adverse serious drug reaction;Medi Alert bracelet  is recommended   Propoxyphene Hcl Swelling, Rash   rash   Latex    rash   Macrodantin [nitrofurantoin Macrocrystal] Other (See Comments)   Numbness, aching all over,    Other    Pine nuts & walnuts throat congestion. No documented angioedema.   Ciprofloxacin    ? Nausea (Note: she takes this when on mission trips)   Tramadol    ? nausea      Medication List      aspirin 325 MG EC tablet Take 325 mg by mouth daily.   EPINEPHrine 0.3 mg/0.3 mL Soaj injection Commonly known as:  EPI-PEN Use as directed for life-threatening allergic reactions.   fluticasone 50 MCG/ACT nasal spray Commonly known as:  FLONASE Place 1 spray into both nostrils 2 (two) times daily.   freestyle lancets Check blood sugar once daily as directed DX:790.29 (FREESTYLE FREEDOM LITE)   glucose blood test strip Commonly known as:  FREESTYLE LITE CHECK BLOOD SUGAR ONCE DAILY AS DIRECTED.  DX:790.29   metFORMIN 500 MG tablet Commonly known as:  GLUCOPHAGE 1 daily at the completion of largest meal   metroNIDAZOLE 0.75 % vaginal gel Commonly known as:  METROGEL Place 1 Applicatorful vaginally 2 (two) times daily.   montelukast 10 MG tablet Commonly known as:  SINGULAIR Take 1 tablet (10 mg total) by mouth at bedtime.   multivitamin tablet Take 1 tablet by mouth daily.   MYRBETRIQ 25 MG Tb24 tablet Generic drug:  mirabegron ER Take 25 mg by mouth daily.   NONFORMULARY OR COMPOUNDED  ITEM Estradiol .02% 1 ML Prefilled Applicator Sig: apply vaginally twice a week #90 Day Supply with 4 refills   phenazopyridine 200 MG tablet Commonly known as:  PYRIDIUM Take 1 tablet (200 mg total) by mouth 3 (three) times daily as needed for pain.   prednisoLONE acetate 1 % ophthalmic suspension Commonly known as:  PRED FORTE Place 1 drop into the left eye 4 times daily.   predniSONE 10 MG tablet Commonly known as:  DELTASONE   ranitidine 300 MG tablet Commonly known as:  ZANTAC Take 1 tablet (300 mg total) by mouth at bedtime.   tiZANidine 4 MG tablet Commonly known as:   ZANAFLEX   trimethoprim 100 MG tablet Commonly known as:  TRIMPEX Take 100 mg by mouth daily.   valACYclovir 1000 MG tablet Commonly known as:  VALTREX TK 1 T PO D   verapamil 180 MG CR tablet Commonly known as:  CALAN-SR       Past Medical History:  Diagnosis Date  . Borderline abnormal TFTs    as a teen  . Diabetes mellitus without complication ([place])   . E. coli UTI 10/05/12   [facility]; resistant only to Septra DS & tetracycline  . GERD (gastroesophageal reflux disease)   . Headache(784.0)   . HSV-1 infection   . HTN (hypertension)   . Hyperglycemia   . Hyperlipidemia   . Meningioma ([place])   . Nephrolithiasis    Dr Amalia Hailey, [facility], Hx of [3][  . Polyp of colon   . Radiculopathy     Past Surgical History:  Procedure Laterality Date  . ABDOMINAL HYSTERECTOMY     TAH/BSO --fibroids, no cancer  . BALLOON DILATION N/A 07/20/2014   Procedure: BALLOON DILATION;  Surgeon: Garlan Fair, MD;  Location: [facility];  Service: Endoscopy;  Laterality: N/A;  . BREAST BIOPSY     Right  . CARPAL TUNNEL RELEASE     & trigger thumb RUE; Dr Alphonzo Cruise  . COLONOSCOPY W/ POLYPECTOMY  07/2004   Neg 2011; Dr Earle Gell  . CYSTOSCOPY  11/2009   Neg  . ESOPHAGOGASTRODUODENOSCOPY N/A 07/20/2014   Procedure: ESOPHAGOGASTRODUODENOSCOPY (EGD);  Surgeon: Garlan Fair, MD;  Location: [facility];  Service: Endoscopy;  Laterality: N/A;  . FLEXIBLE BRONCHOSCOPY W/ UPPER ENDOSCOPY     neg  . KNEE ARTHROSCOPY     Left  . ROTATOR CUFF REPAIR     R shoulder  . TONSILLECTOMY    . TRIGGER FINGER RELEASE Right    Right Thumb    Review of systems negative except as noted in HPI / PMHx or noted below:  Review of Systems  Constitutional: Negative.   HENT: Negative.   Eyes: Negative.   Respiratory: Negative.   Cardiovascular: Negative.   Gastrointestinal: Negative.   Genitourinary: Negative.   Musculoskeletal: Negative.   Skin: Negative.   Neurological: Negative.     Endo/Heme/Allergies: Negative.   Psychiatric/Behavioral: Negative.      Objective:   Vitals:   10/02/17 1052  BP: 130/70  Pulse: 92  Resp: 16          Physical Exam  Constitutional: She is well-developed, well-nourished, and in no distress.  Slightly raspy voice  HENT:  Head: Normocephalic.  Right Ear: Tympanic membrane, external ear and ear canal normal.  Left Ear: Tympanic membrane, external ear and ear canal normal.  Nose: Nose normal. No mucosal edema or rhinorrhea.  Mouth/Throat: Uvula is midline, oropharynx is clear and moist and mucous membranes are normal.  No oropharyngeal exudate.  Eyes: Conjunctivae are normal.  Neck: Trachea normal. No tracheal tenderness present. No tracheal deviation present. No thyromegaly present.  Cardiovascular: Normal rate, regular rhythm, S1 normal, S2 normal and normal heart sounds.  No murmur heard. Pulmonary/Chest: Breath sounds normal. No stridor. No respiratory distress. She has no wheezes. She has no rales.  Musculoskeletal: She exhibits no edema.  Lymphadenopathy:       Head (right side): No tonsillar adenopathy present.       Head (left side): No tonsillar adenopathy present.    She has no cervical adenopathy.  Neurological: She is alert. Gait normal.  Skin: No rash noted. She is not diaphoretic. No erythema. Nails show no clubbing.  Psychiatric: Mood and affect normal.    Diagnostics: none  Assessment and Plan:   1. Other allergic rhinitis   2. LPRD (laryngopharyngeal reflux disease)   3. 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 Continue to perform Allergen avoidance measures  2. Continue to Treat and prevent inflammation:   A. Restart Flonase one-2 sprays each nostril one time per day  B. Restart montelukast 10 mg tablet once a day  3. Continue to Treat LPR:   A. continue to consolidate all caffeine and chocolate use  B. ranitidine 300 mg 2 times per day  C. can add OTC Tums if  needed  4. If needed:   A. nasal saline  B. OTC antihistamine  C. Epi-Pen  5. Continue to throat clearing with swallowing maneuver  6. Return to clinic in 6 months or earlier if problem  We will have Kristen Murray restart her Flonase and montelukast while she continues on high-dose H2 receptor blocker and remains away from all caffeine and chocolate use to address her respiratory tract issues.  Hopefully with this combination of therapy she will do well.  I suspect that her reflux probably became a little more active with her high-dose systemic steroid utilized in the treatment of her temporal arteritis and hopefully as she tapers off the systemic steroid we will get less acid exposure onto her throat.  She is intolerant of using proton pump inhibitors and thus we need to rely on the use of an H2 receptor blocker as noted above to address her reflux issue.  I will see her back in this clinic in 6 months or earlier if there is a problem.  Allena Katz, MD Allergy / Immunology [place]

## 2017-10-03 ENCOUNTER — Encounter: Payer: Self-pay | Admitting: Allergy and Immunology

## 2017-10-04 ENCOUNTER — Telehealth: Payer: Self-pay | Admitting: Allergy and Immunology

## 2017-10-04 MED ORDER — MONTELUKAST SODIUM 10 MG PO TABS: 10.0000 mg | ORAL_TABLET | Freq: Every day | ORAL | 5 refills | Status: DC

## 2017-10-04 MED ORDER — RANITIDINE HCL 300 MG PO TABS: 300.0000 mg | ORAL_TABLET | Freq: Every day | ORAL | 5 refills | Status: DC

## 2017-10-04 MED ORDER — FLUTICASONE PROPIONATE 50 MCG/ACT NA SUSP: 1.0000 | Freq: Two times a day (BID) | NASAL | 5 refills | Status: DC

## 2017-10-04 NOTE — Telephone Encounter (Signed)
Pt called and said that the rx were called into wrong pharmacy and need them to go to [facility] on [address] in [place] [phone].

## 2017-10-04 NOTE — Telephone Encounter (Signed)
Patient has been advised that prescriptions have been resent to the correct pharmacy. I have also removed the [facility] from her chart.

## 2017-11-09 ENCOUNTER — Ambulatory Visit (INDEPENDENT_AMBULATORY_CARE_PROVIDER_SITE_OTHER): Payer: Medicare Other | Admitting: Gynecology

## 2017-11-09 ENCOUNTER — Encounter: Payer: Self-pay | Admitting: Gynecology

## 2017-11-09 VITALS — BP 118/76 | Ht 65.0 in | Wt 176.0 lb

## 2017-11-09 DIAGNOSIS — Z01411 Encounter for gynecological examination (general) (routine) with abnormal findings: Secondary | ICD-10-CM

## 2017-11-09 DIAGNOSIS — N941 Unspecified dyspareunia: Secondary | ICD-10-CM

## 2017-11-09 DIAGNOSIS — N952 Postmenopausal atrophic vaginitis: Secondary | ICD-10-CM

## 2017-11-09 NOTE — Progress Notes (Signed)
    CANDIACE WEST 09/15/1946 [id number]        71 y.o.  [id number] for breast and pelvic exam.  Former patient of Dr. Toney Rakes.  Without gynecologic complaints.  Past medical history,surgical history, problem list, medications, allergies, family history and social history were all reviewed and documented as reviewed in the EPIC chart.  ROS:  Performed with pertinent positives and negatives included in the history, assessment and plan.   Additional significant findings : None   Exam: Caryn Bee assistant Vitals:   11/09/17 1055  BP: 118/76  Weight: 176 lb (79.8 kg)  Height: 5\' 5"  (1.651 m)   Body mass index is 29.29 kg/m.  General appearance:  Normal affect, orientation and appearance. Skin: Grossly normal HEENT: Without gross lesions.  No cervical or supraclavicular adenopathy. Thyroid normal.  Lungs:  Clear without wheezing, rales or rhonchi Cardiac: RR, without RMG Abdominal:  Soft, nontender, without masses, guarding, rebound, organomegaly or hernia Breasts:  Examined lying and sitting without masses, retractions, discharge or axillary adenopathy. Pelvic:  Ext, BUS, Vagina: With atrophic changes  Adnexa: Without masses or tenderness    Anus and perineum: Normal   Rectovaginal: Normal sphincter tone without palpated masses or tenderness.    Assessment/Plan:  72 y.o. [id number] female for breast and pelvic exam.  Status post TAH/BSO in the past for leiomyoma.  1. Postmenopausal/atrophic genital changes.  Was prescribed vaginal estrogen cream by Dr. Toney Rakes due to dyspareunia but she never started this because she read about it and was scared about the side effects.  At this point she is not interested in using it.  Is not having vaginal dryness daily but only with intercourse.  Recommended OTC lubricants both water and oil-based to see if this does not help.  If she continues to have issues then she will follow-up for re-discussion of treatment options. 2. Mammography coming due  and I reminded her to schedule this in March.  Breast exam normal today.  SBE monthly reviewed. 3. DEXA 2017 normal.  Recommend repeat DEXA at 5-year interval.  Increase calcium vitamin D reviewed. 4. Colonoscopy 2011 with reported repeat interval 10 years. 5. Pap smear/HPV 2016.  No Pap smear done today.  No history of abnormal Pap smears previously.  Reviewed current screening guidelines and we both agree to stop screening based on age and hysterectomy history. 6. Health maintenance.  No routine lab work done as patient does this elsewhere.  Follow-up 1 year, sooner as needed.   Anastasio Auerbach MD, 11:22 AM 11/09/2017

## 2017-11-09 NOTE — Patient Instructions (Signed)
Follow-up in 1 year for annual exam 

## 2018-04-03 ENCOUNTER — Encounter: Payer: Self-pay | Admitting: Allergy and Immunology

## 2018-04-03 ENCOUNTER — Ambulatory Visit (INDEPENDENT_AMBULATORY_CARE_PROVIDER_SITE_OTHER): Payer: Medicare Other | Admitting: Allergy and Immunology

## 2018-04-03 VITALS — BP 120/62 | HR 88 | Resp 16

## 2018-04-03 DIAGNOSIS — K219 Gastro-esophageal reflux disease without esophagitis: Secondary | ICD-10-CM | POA: Diagnosis not present

## 2018-04-03 DIAGNOSIS — T7805XD Anaphylactic reaction due to tree nuts and seeds, subsequent encounter: Secondary | ICD-10-CM

## 2018-04-03 DIAGNOSIS — J3089 Other allergic rhinitis: Secondary | ICD-10-CM

## 2018-04-03 MED ORDER — MONTELUKAST SODIUM 10 MG PO TABS: 10.0000 mg | ORAL_TABLET | Freq: Every day | ORAL | 5 refills | Status: DC

## 2018-04-03 MED ORDER — RANITIDINE HCL 300 MG PO TABS: 300.0000 mg | ORAL_TABLET | Freq: Every day | ORAL | 5 refills | Status: DC

## 2018-04-03 MED ORDER — FLUTICASONE PROPIONATE 50 MCG/ACT NA SUSP: 1.0000 | Freq: Two times a day (BID) | NASAL | 5 refills | Status: DC

## 2018-04-03 NOTE — Progress Notes (Signed)
Follow-up Note  Referring Provider: [name], MD Primary Provider: [name], MD Date of Office Visit: 04/03/2018  Subjective:   Kristen Murray (DOB: 05-18-1946) is a 72 y.o. female who returns to the [facility] on 04/03/2018 in re-evaluation of the following:  HPI: Kristen Murray returns to this clinic in reevaluation of her allergic rhinitis and LPR and history of food allergy.  Her last visit to this clinic was 02 October 2017.  She has done relatively well regarding her nasal issue while consistently using Flonase and montelukast although she occasionally gets some issues with postnasal drip and she does blow her nose occasionally.  This is been a little bit more active since the spring time season has arrived.  She still has never had return of her sense of smell.  She also occasionally gets intermittently raspy and has some mucus in her throat but this is much better while consistently using therapy directed against reflux and remaining away from all caffeine and chocolate consumption.  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  However, she still continues to have significant problems with her eyes followed at [facility] and is presently using a combination of doxycycline and steroid eyedrops and ointment to address this issue.  She remains away from consuming tree nuts and pine nuts and avocado.  Allergies as of 04/03/2018      Reactions   Codeine    Hives Because of a history of documented adverse serious drug reaction;Medi Alert bracelet  is recommended   Propoxyphene Rash   Propoxyphene Hcl Swelling, Rash   rash   Latex    rash   Macrodantin [nitrofurantoin Macrocrystal] Other (See Comments)   Numbness, aching all over,    Other    Pine nuts & walnuts throat congestion. No documented angioedema.   Ciprofloxacin    ? Nausea (Note: she takes this when on mission trips)   Tramadol    ? nausea        Medication List      aspirin 325 MG EC tablet Take 325 mg by mouth daily.   doxycycline 100 MG capsule Commonly known as:  MONODOX   erythromycin ophthalmic ointment erythromycin 5 mg/gram (0.5 %) eye ointment   fluorometholone 0.1 % ophthalmic suspension Commonly known as:  FML Place 1 drop into both eyes 3 (three) times daily.   fluticasone 50 MCG/ACT nasal spray Commonly known as:  FLONASE Place 1 spray into both nostrils 2 (two) times daily.   freestyle lancets Check blood sugar once daily as directed DX:790.29 (FREESTYLE FREEDOM LITE)   glucose blood test strip Commonly known as:  FREESTYLE LITE CHECK BLOOD SUGAR ONCE DAILY AS DIRECTED.  DX:790.29   metFORMIN 500 MG tablet Commonly known as:  GLUCOPHAGE 1 daily at the completion of largest meal   montelukast 10 MG tablet Commonly known as:  SINGULAIR Take 1 tablet (10 mg total) by mouth at bedtime.   multivitamin tablet Take 1 tablet by mouth daily.   MYRBETRIQ 25 MG Tb24 tablet Generic drug:  mirabegron ER Take 25 mg by mouth daily.   NONFORMULARY OR COMPOUNDED ITEM Estradiol .02% 1 ML Prefilled Applicator Sig: apply vaginally twice a week #90 Day Supply with 4 refills   ranitidine 300 MG tablet Commonly known as:  ZANTAC Take 1 tablet (300 mg total) by mouth at bedtime.   trimethoprim 100 MG tablet Commonly known as:  TRIMPEX Take  100 mg by mouth daily.   valACYclovir 1000 MG tablet Commonly known as:  VALTREX TK 1 T PO D   verapamil 180 MG CR tablet Commonly known as:  CALAN-SR       Past Medical History:  Diagnosis Date  . Borderline abnormal TFTs    as a teen  . Diabetes mellitus without complication ([place])   . E. coli UTI 10/05/12   [facility]; resistant only to Septra DS & tetracycline  . Eye infection   . GERD (gastroesophageal reflux disease)   . Headache(784.0)   . HSV-1 infection   . HTN (hypertension)   . Hyperglycemia   . Hyperlipidemia   . Meningioma ([place])   .  Nephrolithiasis    Dr Amalia Hailey, [facility], Hx of [3][  . Polyp of colon   . Radiculopathy     Past Surgical History:  Procedure Laterality Date  . ABDOMINAL HYSTERECTOMY     TAH/BSO --fibroids, no cancer  . BALLOON DILATION N/A 07/20/2014   Procedure: BALLOON DILATION;  Surgeon: Garlan Fair, MD;  Location: [facility];  Service: Endoscopy;  Laterality: N/A;  . BREAST BIOPSY     Right  . CARPAL TUNNEL RELEASE     & trigger thumb RUE; Dr Alphonzo Cruise  . COLONOSCOPY W/ POLYPECTOMY  07/2004   Neg 2011; Dr Earle Gell  . CYSTOSCOPY  11/2009   Neg  . ESOPHAGOGASTRODUODENOSCOPY N/A 07/20/2014   Procedure: ESOPHAGOGASTRODUODENOSCOPY (EGD);  Surgeon: Garlan Fair, MD;  Location: [facility];  Service: Endoscopy;  Laterality: N/A;  . FLEXIBLE BRONCHOSCOPY W/ UPPER ENDOSCOPY     neg  . KNEE ARTHROSCOPY     Left  . ROTATOR CUFF REPAIR     R shoulder  . TONSILLECTOMY    . TRIGGER FINGER RELEASE Right    Right Thumb    Review of systems negative except as noted in HPI / PMHx or noted below:  Review of Systems  Constitutional: Negative.   HENT: Negative.   Eyes: Negative.   Respiratory: Negative.   Cardiovascular: Negative.   Gastrointestinal: Negative.   Genitourinary: Negative.   Musculoskeletal: Negative.   Skin: Negative.   Neurological: Negative.   Endo/Heme/Allergies: Negative.   Psychiatric/Behavioral: Negative.      Objective:   Vitals:   04/03/18 0952  BP: 120/62  Pulse: 88  Resp: 16          Physical Exam  HENT:  Head: Normocephalic.  Right Ear: Tympanic membrane, external ear and ear canal normal.  Left Ear: Tympanic membrane, external ear and ear canal normal.  Nose: Nose normal. No mucosal edema or rhinorrhea.  Mouth/Throat: Uvula is midline, oropharynx is clear and moist and mucous membranes are normal. No oropharyngeal exudate.  Eyes: Conjunctivae are normal.  Neck: Trachea normal. No tracheal tenderness present. No tracheal deviation present. No  thyromegaly present.  Cardiovascular: Normal rate, regular rhythm, S1 normal, S2 normal and normal heart sounds.  No murmur heard. Pulmonary/Chest: Breath sounds normal. No stridor. No respiratory distress. She has no wheezes. She has no rales.  Musculoskeletal: She exhibits no edema.  Lymphadenopathy:       Head (right side): No tonsillar adenopathy present.       Head (left side): No tonsillar adenopathy present.    She has no cervical adenopathy.  Neurological: She is alert.  Skin: No rash noted. She is not diaphoretic. No erythema. Nails show no clubbing.    Diagnostics: none  Assessment and Plan:   1. Other allergic rhinitis  2. LPRD (laryngopharyngeal reflux disease)   3. 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 Continue to perform Allergen avoidance measures  2. Continue to Treat and prevent inflammation:   A. Flonase one-2 sprays each nostril one time per day  B. montelukast 10 mg tablet once a day  3. Continue to Treat LPR:   A. continue to consolidate all caffeine and chocolate use  B. ranitidine 300 mg 2 times per day  C. can add OTC Tums if needed  4. If needed:   A. nasal saline  B. OTC antihistamine  C. Epi-Pen  D. Can add OTC Mucinex two times per day  5. Continue to replace throat clearing with swallowing maneuver  6. Continue therapy of eye disease with [place]  7. Consider a course of immunotherapy  8. Return to clinic in 6 months or earlier if problem  Peni appears to have significant problems with her atopic respiratory disease and to some degree probably a component of allergic conjunctivitis in the face of good anti-inflammatory therapy and she is definitely a candidate for immunotherapy and I have given her literature on this form of treatment.  As well, her LPR appears to be okay although certainly she still has some issues with her throat on her current plan.  In the past she has been somewhat intolerant of  using proton pump inhibitors.  We will keep her on ranitidine twice a day at this point and of course remain away from all caffeine and chocolate.  I will see her back in this clinic in 6 months or earlier if there is a problem.  Allena Katz, MD Allergy / Immunology [place]

## 2018-04-03 NOTE — Patient Instructions (Addendum)
  1. Continue to perform Allergen avoidance measures  2. Continue to Treat and prevent inflammation:   A. Flonase one-2 sprays each nostril one time per day  B. montelukast 10 mg tablet once a day  3. Continue to Treat LPR:   A. continue to consolidate all caffeine and chocolate use  B. ranitidine 300 mg 2 times per day  C. can add OTC Tums if needed  4. If needed:   A. nasal saline  B. OTC antihistamine  C. Epi-Pen  D. Can add OTC Mucinex two times per day  5. Continue to replace throat clearing with swallowing maneuver  6. Continue therapy of eye disease with [place]  7. Consider a course of immunotherapy  8. Return to clinic in 6 months or earlier if problem

## 2018-04-04 ENCOUNTER — Encounter: Payer: Self-pay | Admitting: Allergy and Immunology

## 2018-05-30 ENCOUNTER — Ambulatory Visit
Admission: RE | Admit: 2018-05-30 | Discharge: 2018-05-30 | Disposition: A | Discharge status: Home / Self Care | Payer: Medicare Other | Source: Ambulatory Visit | Attending: Otolaryngology | Admitting: Otolaryngology

## 2018-05-30 ENCOUNTER — Other Ambulatory Visit: Payer: Self-pay | Admitting: Otolaryngology

## 2018-05-30 DIAGNOSIS — R05 Cough: Secondary | ICD-10-CM

## 2018-05-30 DIAGNOSIS — J410 Simple chronic bronchitis: Secondary | ICD-10-CM

## 2018-05-30 DIAGNOSIS — R059 Cough, unspecified: Secondary | ICD-10-CM

## 2018-07-21 ENCOUNTER — Encounter (HOSPITAL_BASED_OUTPATIENT_CLINIC_OR_DEPARTMENT_OTHER): Payer: Self-pay | Admitting: *Deleted

## 2018-07-21 ENCOUNTER — Emergency Department (HOSPITAL_BASED_OUTPATIENT_CLINIC_OR_DEPARTMENT_OTHER)
Admission: EM | Admit: 2018-07-21 | Discharge: 2018-07-21 | Disposition: A | Discharge status: Home / Self Care | Payer: Medicare Other | Attending: Emergency Medicine | Admitting: Emergency Medicine

## 2018-07-21 ENCOUNTER — Other Ambulatory Visit: Payer: Self-pay

## 2018-07-21 DIAGNOSIS — R300 Dysuria: Secondary | ICD-10-CM | POA: Diagnosis present

## 2018-07-21 DIAGNOSIS — I10 Essential (primary) hypertension: Secondary | ICD-10-CM | POA: Diagnosis not present

## 2018-07-21 DIAGNOSIS — E119 Type 2 diabetes mellitus without complications: Secondary | ICD-10-CM | POA: Insufficient documentation

## 2018-07-21 DIAGNOSIS — Z87440 Personal history of urinary (tract) infections: Secondary | ICD-10-CM | POA: Diagnosis not present

## 2018-07-21 DIAGNOSIS — Z7982 Long term (current) use of aspirin: Secondary | ICD-10-CM | POA: Insufficient documentation

## 2018-07-21 DIAGNOSIS — Z79899 Other long term (current) drug therapy: Secondary | ICD-10-CM | POA: Insufficient documentation

## 2018-07-21 DIAGNOSIS — N3001 Acute cystitis with hematuria: Secondary | ICD-10-CM | POA: Insufficient documentation

## 2018-07-21 LAB — COMPREHENSIVE METABOLIC PANEL
ALT: 12 U/L (ref 0–44)
ANION GAP: 8 (ref 5–15)
AST: 17 U/L (ref 15–41)
Albumin: 3.9 g/dL (ref 3.5–5.0)
Alkaline Phosphatase: 84 U/L (ref 38–126)
BILIRUBIN TOTAL: 0.1 mg/dL — AB (ref 0.3–1.2)
BUN: 18 mg/dL (ref 8–23)
CO2: 28 mmol/L (ref 22–32)
Calcium: 9 mg/dL (ref 8.9–10.3)
Chloride: 103 mmol/L (ref 98–111)
Creatinine, Ser: 0.92 mg/dL (ref 0.44–1.00)
Glucose, Bld: 156 mg/dL — ABNORMAL HIGH (ref 70–99)
POTASSIUM: 4 mmol/L (ref 3.5–5.1)
Sodium: 139 mmol/L (ref 135–145)
TOTAL PROTEIN: 7.4 g/dL (ref 6.5–8.1)

## 2018-07-21 LAB — CBC WITH DIFFERENTIAL/PLATELET
BASOS ABS: 0 10*3/uL (ref 0.0–0.1)
Basophils Relative: 0 %
EOS PCT: 0 %
Eosinophils Absolute: 0 10*3/uL (ref 0.0–0.7)
HEMATOCRIT: 35.9 % — AB (ref 36.0–46.0)
HEMOGLOBIN: 11.8 g/dL — AB (ref 12.0–15.0)
LYMPHS ABS: 1.6 10*3/uL (ref 0.7–4.0)
LYMPHS PCT: 54 %
MCH: 27.4 pg (ref 26.0–34.0)
MCHC: 32.9 g/dL (ref 30.0–36.0)
MCV: 83.3 fL (ref 78.0–100.0)
Monocytes Absolute: 0.3 10*3/uL (ref 0.1–1.0)
Monocytes Relative: 10 %
NEUTROS PCT: 36 %
Neutro Abs: 1.1 10*3/uL — ABNORMAL LOW (ref 1.7–7.7)
PLATELETS: 201 10*3/uL (ref 150–400)
RBC: 4.31 MIL/uL (ref 3.87–5.11)
RDW: 17.4 % — ABNORMAL HIGH (ref 11.5–15.5)
WBC: 3 10*3/uL — AB (ref 4.0–10.5)

## 2018-07-21 LAB — URINALYSIS, ROUTINE W REFLEX MICROSCOPIC
Bilirubin Urine: NEGATIVE
Glucose, UA: NEGATIVE mg/dL
Ketones, ur: NEGATIVE mg/dL
Nitrite: NEGATIVE
PROTEIN: NEGATIVE mg/dL
SPECIFIC GRAVITY, URINE: 1.02 (ref 1.005–1.030)
pH: 6 (ref 5.0–8.0)

## 2018-07-21 LAB — URINALYSIS, MICROSCOPIC (REFLEX): Squamous Epithelial / LPF: NONE SEEN (ref 0–5)

## 2018-07-21 LAB — LIPASE, BLOOD: Lipase: 27 U/L (ref 11–51)

## 2018-07-21 MED ORDER — CEPHALEXIN 500 MG PO CAPS: 500.0000 mg | ORAL_CAPSULE | Freq: Two times a day (BID) | ORAL | 0 refills | Status: AC

## 2018-07-21 MED ORDER — CEPHALEXIN 250 MG PO CAPS
1000.0000 mg | ORAL_CAPSULE | Freq: Once | ORAL | Status: AC
  Administered 2018-07-21: 1000 mg via ORAL
  Filled 2018-07-21: qty 4

## 2018-07-21 MED ORDER — PHENAZOPYRIDINE HCL 100 MG PO TABS
200.0000 mg | ORAL_TABLET | Freq: Once | ORAL | Status: AC
  Administered 2018-07-21: 200 mg via ORAL
  Filled 2018-07-21: qty 2

## 2018-07-21 MED ORDER — PHENAZOPYRIDINE HCL 200 MG PO TABS: 200.0000 mg | ORAL_TABLET | Freq: Three times a day (TID) | ORAL | 0 refills | Status: DC

## 2018-07-21 NOTE — ED Provider Notes (Signed)
[facility] EMERGENCY DEPARTMENT Provider Note   CSN: [id number] Arrival date & time: 07/21/18  1904     History   Chief Complaint Chief Complaint  Patient presents with  . Dysuria    HPI Kristen Murray is a 72 y.o. female with a history of HTN, nephrolithiasis, HLD, diabetes mellitus, recurrent UTIs, severe bilateral adenoviral conjunctivitis who presents to the emergency department with a chief complaint of dysuria.  The patient endorses burning dysuria, urinary frequency, and pressure after voiding.  He also endorses mild, constant suprapubic pain.  Pain is nonradiating.  She denies hematuria, back pain, nausea, vomiting, diarrhea, constipation, chills, vaginal pain or discharge, chest pain, dyspnea.  She states that she felt hot, but she has not checked her temperature.  No treatment prior to arrival.  She is concerned that she has a UTI.  Reports that she is taking doxycycline and Macrobid secondary to her chronic conjunctivitis.  She is followed by ophthalmology at [facility].  She reports that she just traveled from [place] to [place].  No treatment prior to arrival.  Report that she was previously given Macrobid for a UTI, but was told by her urologist to no longer take it.  She is also allergic to ciprofloxacin.  The history is provided by the patient. No language interpreter was used.    Past Medical History:  Diagnosis Date  . Borderline abnormal TFTs    as a teen  . Diabetes mellitus without complication ([place])   . E. coli UTI 10/05/12   [facility]; resistant only to Septra DS & tetracycline  . Eye infection   . GERD (gastroesophageal reflux disease)   . Headache(784.0)   . HSV-1 infection   . HTN (hypertension)   . Hyperglycemia   . Hyperlipidemia   . Meningioma ([place])   . Nephrolithiasis    Dr Amalia Hailey, [facility], Hx of [3][  . Polyp of colon   . Radiculopathy     Patient Active Problem List   Diagnosis Date Noted  . Pain in the chest   .  Chest pain at rest 11/26/2014  . Shingles 05/14/2014  . 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 Vaginal atrophy 10/16/2012  . Menopause 10/16/2012  . Chest pain, atypical 02/19/2012  . Type 2 diabetes mellitus with neurological manifestations, controlled ([place]) 02/19/2012  . COSTOCHONDRITIS 11/16/2010  . DISTURBANCES OF SENSATION OF SMELL AND TASTE 04/05/2010  . NEPHROLITHIASIS, HX OF 02/08/2010  . EDEMA 10/01/2009  . Brachial neuritis or radiculitis NOS 06/14/2009  . EXTERNAL HEMORRHOIDS 05/25/2009  . MUSCLE PAIN 05/17/2009  . HYPERLIPIDEMIA 04/23/2009  . ABNORMAL THYROID FUNCTION TESTS 04/23/2009  . MENINGIOMA 12/26/2007  . HEADACHE 12/26/2007  . RADICULOPATHY 04/02/2007  . HYPERTENSION 11/21/2006  . GERD 11/21/2006    Past Surgical History:  Procedure Laterality Date  . ABDOMINAL HYSTERECTOMY     TAH/BSO --fibroids, no cancer  . BALLOON DILATION N/A 07/20/2014   Procedure: BALLOON DILATION;  Surgeon: Garlan Fair, MD;  Location: [facility];  Service: Endoscopy;  Laterality: N/A;  . BREAST BIOPSY     Right  . CARPAL TUNNEL RELEASE     & trigger thumb RUE; Dr Alphonzo Cruise  . COLONOSCOPY W/ POLYPECTOMY  07/2004   Neg 2011; Dr Earle Gell  . CYSTOSCOPY  11/2009   Neg  . ESOPHAGOGASTRODUODENOSCOPY N/A 07/20/2014   Procedure: ESOPHAGOGASTRODUODENOSCOPY (EGD);  Surgeon: Garlan Fair, MD;  Location: [facility];  Service: Endoscopy;  Laterality: N/A;  . FLEXIBLE BRONCHOSCOPY W/ UPPER ENDOSCOPY  neg  . KNEE ARTHROSCOPY     Left  . ROTATOR CUFF REPAIR     R shoulder  . TONSILLECTOMY    . TRIGGER FINGER RELEASE Right    Right Thumb     OB History    Gravida  3   Para  3   Term  3   Preterm      AB      Living  3     SAB      TAB      Ectopic      Multiple      Live Births  3            Home Medications    Prior to Admission medications   Medication Sig Start Date End Date Taking? Authorizing Provider  aspirin 325 MG EC tablet Take 325 mg by  mouth daily.    [provider]  cephALEXin (KEFLEX) 500 MG capsule Take 1 capsule (500 mg total) by mouth 2 (two) times daily for 7 days. 07/21/18 07/28/18  McDonald, Mia A, PA-C  doxycycline (MONODOX) 100 MG capsule  03/26/18   [provider]  erythromycin ophthalmic ointment erythromycin 5 mg/gram (0.5 %) eye ointment 03/07/18   [provider]  fluorometholone (FML) 0.1 % ophthalmic suspension Place 1 drop into both eyes 3 (three) times daily.    [provider]  fluticasone (FLONASE) 50 MCG/ACT nasal spray Place 1 spray into both nostrils 2 (two) times daily. 04/03/18   Kozlow, Donnamarie Poag, MD  glucose blood (FREESTYLE LITE) test strip CHECK BLOOD SUGAR ONCE DAILY AS DIRECTED.  DX:790.29 01/09/14   Hendricks Limes, MD  Lancets (FREESTYLE) lancets Check blood sugar once daily as directed DX:790.29 (FREESTYLE FREEDOM LITE) 01/09/14   Hendricks Limes, MD  metFORMIN (GLUCOPHAGE) 500 MG tablet 1 daily at the completion of largest meal 08/17/14   Hendricks Limes, MD  mirabegron ER (MYRBETRIQ) 25 MG TB24 tablet Take 25 mg by mouth daily.    [provider]  montelukast (SINGULAIR) 10 MG tablet Take 1 tablet (10 mg total) by mouth at bedtime. 04/03/18   Kozlow, Donnamarie Poag, MD  Multiple Vitamin (MULTIVITAMIN) tablet Take 1 tablet by mouth daily.      [provider]  NONFORMULARY OR COMPOUNDED ITEM Estradiol .02% 1 ML Prefilled Applicator Sig: apply vaginally twice a week #90 Day Supply with 4 refills 11/08/16   Terrance Mass, MD  phenazopyridine (PYRIDIUM) 200 MG tablet Take 1 tablet (200 mg total) by mouth 3 (three) times daily with meals. 07/21/18   McDonald, Mia A, PA-C  ranitidine (ZANTAC) 300 MG tablet Take 1 tablet (300 mg total) by mouth at bedtime. 04/03/18   Kozlow, Donnamarie Poag, MD  trimethoprim (TRIMPEX) 100 MG tablet Take 100 mg by mouth daily.    [provider]  valACYclovir (VALTREX) 1000 MG tablet TK 1 T PO D 01/06/16   [provider]  verapamil (CALAN-SR) 180 MG CR tablet  01/02/17   [provider]    Family History Family History  Problem Relation Age of Onset  . Alcohol abuse Mother   . Diabetes Maternal Aunt   . Diabetes Maternal Uncle   . Diabetes Maternal Grandmother   . Tuberculosis Maternal Uncle   . Other Father        unknown  . Coronary artery disease Neg Hx     Social History Social History   Tobacco Use  . Smoking status: Never  Smoker  . Smokeless tobacco: Never Used  Substance Use Topics  . Alcohol use: No    Alcohol/week: 0.0 standard drinks  . Drug use: No     Allergies   Codeine; Propoxyphene; Propoxyphene hcl; Latex; Macrodantin [nitrofurantoin macrocrystal]; Other; Ciprofloxacin; and Tramadol   Review of Systems Review of Systems  Constitutional: Negative for activity change, chills and fever.  HENT: Negative for congestion.   Respiratory: Negative for shortness of breath.   Cardiovascular: Negative for chest pain.  Gastrointestinal: Positive for abdominal pain. Negative for diarrhea, nausea and vomiting.  Genitourinary: Positive for dysuria, frequency and urgency. Negative for pelvic pain, vaginal discharge and vaginal pain.  Musculoskeletal: Negative for back pain.  Skin: Negative for rash.  Allergic/Immunologic: Negative for immunocompromised state.  Neurological: Negative for dizziness, seizures, weakness and headaches.  Psychiatric/Behavioral: Negative for confusion.     Physical Exam Updated Vital Signs BP 128/79   Pulse 97   Temp 98.3 F (36.8 C) (Oral)   Resp 16   Ht 5\' 5"  (1.651 m)   Wt 77.1 kg   LMP 07/30/1990   SpO2 100%   BMI 28.29 kg/m   Physical Exam  Constitutional: No distress.  Well appearing. Nontoxic appearing.  HENT:  Head: Normocephalic.  Eyes: Conjunctivae are normal.  Neck: Neck supple.  Cardiovascular: Normal rate, regular rhythm, normal heart sounds and intact distal pulses. Exam reveals no gallop and no friction rub.  No  murmur heard. Pulmonary/Chest: Effort normal. No stridor. No respiratory distress. She has no wheezes. She has no rales. She exhibits no tenderness.  Abdominal: Soft. She exhibits no distension and no mass. There is no tenderness. There is no rebound and no guarding. No hernia.  No CVA tenderness bilaterally.  Abdomen is soft, nontender, nondistended.  No peritoneal signs.  Neurological: She is alert.  Skin: Skin is warm. No rash noted.  Psychiatric: Her behavior is normal.  Nursing note and vitals reviewed.    ED Treatments / Results  Labs (all labs ordered are listed, but only abnormal results are displayed) Labs Reviewed  URINALYSIS, ROUTINE W REFLEX MICROSCOPIC - Abnormal; Notable for the following components:      Result Value   Hgb urine dipstick TRACE (*)    Leukocytes, UA SMALL (*)    All other components within normal limits  CBC WITH DIFFERENTIAL/PLATELET - Abnormal; Notable for the following components:   WBC 3.0 (*)    Hemoglobin 11.8 (*)    HCT 35.9 (*)    RDW 17.4 (*)    Neutro Abs 1.1 (*)    All other components within normal limits  COMPREHENSIVE METABOLIC PANEL - Abnormal; Notable for the following components:   Glucose, Bld 156 (*)    Total Bilirubin 0.1 (*)    All other components within normal limits  URINALYSIS, MICROSCOPIC (REFLEX) - Abnormal; Notable for the following components:   Bacteria, UA MANY (*)    All other components within normal limits  LIPASE, BLOOD  I-STAT CG4 LACTIC ACID, ED  I-STAT CG4 LACTIC ACID, ED    EKG None  Radiology No results found.  Procedures Procedures (including critical care time)  Medications Ordered in ED Medications  cephALEXin (KEFLEX) capsule 1,000 mg (1,000 mg Oral Given 07/21/18 2158)  phenazopyridine (PYRIDIUM) tablet 200 mg (200 mg Oral Given 07/21/18 2158)     Initial Impression / Assessment and Plan / ED Course  I have reviewed the triage vital signs and the nursing notes.  Pertinent labs & imaging  results  that were available during my care of the patient were reviewed by me and considered in my medical decision making (see chart for details).     72 year old with a history of HTN, nephrolithiasis, HLD, diabetes mellitus, recurrent UTIs, severe bilateral adenoviral conjunctivitis presenting with dysuria, urinary frequency, pressure after voiding, and suprapubic discomfort.  She states that she has felt hot, but is afebrile and has not taken any antipyretics prior to evaluation in the ED.  Vital signs are unremarkable in the ED.  The patient was seen and evaluated along with Dr. Vallery Ridge.  On exam, abdomen is benign.  Patient is well-appearing.  UA is concerning for infection.  Urine culture sent.  UA with mild hemoglobinuria, but given the patient's exam, doubt infected stone.  CBC with WBC of 3.0, decreased from previous.  Labs are otherwise reassuring.  Looking at previous culture and sensitivities, it appears the patient grew E. coli that was sensitive to Keflex. Dr. Vallery Ridge ordered the patient's first dose of Keflex in the ED.  We will discharge the patient with 500 mg of Keflex twice daily for the next week and Pyridium for symptoms.  Encourage the patient to schedule follow-up appointment regarding her CBC with her primary care provider for later this week.  Strict return precautions given.  She is hemodynamically stable in no acute distress.  Doubt pyelonephritis, surgical abdomen, urosepsis, or nephrolithiasis at this time.  Final Clinical Impressions(s) / ED Diagnoses   Final diagnoses:  Acute cystitis with hematuria    ED Discharge Orders         Ordered    cephALEXin (KEFLEX) 500 MG capsule  2 times daily     07/21/18 2206    phenazopyridine (PYRIDIUM) 200 MG tablet  3 times daily with meals     07/21/18 2206           McDonald, Mia A, PA-C 07/22/18 0040    Charlesetta Shanks, MD 07/23/18 1502

## 2018-07-21 NOTE — ED Provider Notes (Signed)
Medical screening examination/treatment/procedure(s) were conducted as a shared visit with non-physician practitioner(s) and myself.  I personally evaluated the patient during the encounter.  None Reports he has had burning with urination for 2 days.  She reports that she gets a pressure sensation after she urinates as well.  She has had frequency.  No fever, no back pain.  She denies any severe abdominal pain.  Patient is alert and appropriate.  Nontoxic.  No respiratory distress.  Abdomen is soft and nontender.  Analysis is grossly positive.  Patient does not have abdominal pain or suggestive surgical abdomen, kidney stone or obstruction.  Will plan to treat with Keflex and Pyridium.   Charlesetta Shanks, MD 07/21/18 [phone]

## 2018-07-21 NOTE — ED Notes (Signed)
LAC results .76 hand delivered to Dr. Johnney Killian @ 2008

## 2018-07-21 NOTE — Discharge Instructions (Signed)
Thank you for allowing me to care for you today in the Emergency Department.   Your urine culture is pending.  You have been given your first dose of antibiotics in the emergency department.  Starting tomorrow morning, take 1 tablet of Keflex 2 times daily for the next week.  For the first 2 days, you can also take 1 tablet of Pyridium by mouth 3 times daily with meals.  Your white blood cell count today was lower than previous.  Please follow-up with your primary care provider to follow-up in your labs.  For pain, you can take Tylenol or ibuprofen.  Please follow-up this week with your primary care provider in 2 to 3 days for recheck of your symptoms.  Return to the emergency department if you develop new or worsening symptoms including fever, chills, severe, uncontrollable pain, vomiting, or new, concerning symptoms.

## 2018-07-21 NOTE — ED Triage Notes (Signed)
Pt reports UTI sx (dysuria) x 2 days. States today she feels hot

## 2018-07-21 NOTE — ED Notes (Signed)
ED Provider at bedside. 

## 2018-07-22 LAB — I-STAT CG4 LACTIC ACID, ED: Lactic Acid, Venous: 0.76 mmol/L (ref 0.5–1.9)

## 2018-09-02 DIAGNOSIS — S92413A Displaced fracture of proximal phalanx of unspecified great toe, initial encounter for closed fracture: Secondary | ICD-10-CM | POA: Insufficient documentation

## 2018-09-02 DIAGNOSIS — S92514A Nondisplaced fracture of proximal phalanx of right lesser toe(s), initial encounter for closed fracture: Secondary | ICD-10-CM | POA: Insufficient documentation

## 2018-09-24 ENCOUNTER — Telehealth: Payer: Self-pay | Admitting: Hematology & Oncology

## 2018-09-24 NOTE — Telephone Encounter (Signed)
Spoke with pt to confirm new patient appt 10/14/18 at 11 am. Pt aware of location date/time

## 2018-10-08 ENCOUNTER — Encounter: Payer: Self-pay | Admitting: Allergy and Immunology

## 2018-10-08 ENCOUNTER — Ambulatory Visit (INDEPENDENT_AMBULATORY_CARE_PROVIDER_SITE_OTHER): Payer: Medicare Other | Admitting: Allergy and Immunology

## 2018-10-08 VITALS — BP 126/68 | HR 88 | Resp 20

## 2018-10-08 DIAGNOSIS — J3089 Other allergic rhinitis: Secondary | ICD-10-CM

## 2018-10-08 DIAGNOSIS — T7805XD Anaphylactic reaction due to tree nuts and seeds, subsequent encounter: Secondary | ICD-10-CM | POA: Diagnosis not present

## 2018-10-08 DIAGNOSIS — K219 Gastro-esophageal reflux disease without esophagitis: Secondary | ICD-10-CM | POA: Diagnosis not present

## 2018-10-08 NOTE — Progress Notes (Signed)
Follow-up Note  Referring Provider: [name], MD Primary Provider: [name], MD Date of Office Visit: 10/08/2018  Subjective:   Kristen Murray (DOB: [birth year]) is a 72 y.o. female who returns to the [facility] on 10/08/2018 in re-evaluation of the following:  HPI: Taijah returns to this clinic in reevaluation of allergic rhinitis and LPR and a history of food allergy directed at tree nuts, pine nuts, banana, and avocado.  I last saw her in this clinic on 03 April 2018.  Overall she has done relatively well with her airway.  She still occasionally has some phlegm stuck in her throat but she is making a concerted effort to not throat clear and this has helped her significantly.  She has not been having any obvious reflux symptoms at this point in time and most of her raspy voice has resolved.  She does remain away from caffeine and chocolate on most days.  Her nose has been doing relatively well on her current plan.  The only issue that does bother her regarding her nose is that she does have a common runny nose. It does not sound as though she has required a systemic steroid or antibiotic to treat any type of airway issue since her last visit.  She did receive the flu vaccine.  She remains away from consuming tree nuts and pine nuts and banana and avocado.  Allergies as of 10/08/2018      Reactions   Codeine    Hives Because of a history of documented adverse serious drug reaction;Medi Alert bracelet  is recommended   Propoxyphene Rash   Propoxyphene Hcl Swelling, Rash   rash   Latex    rash   Macrodantin [nitrofurantoin Macrocrystal] Other (See Comments)   Numbness, aching all over,    Other    Pine nuts & walnuts throat congestion. No documented angioedema.   Ciprofloxacin    ? Nausea (Note: she takes this when on mission trips)   Tramadol    ? nausea      Medication List      aspirin 325 MG EC tablet Take 325 mg by mouth daily.   doxycycline  100 MG capsule Commonly known as:  MONODOX   erythromycin ophthalmic ointment erythromycin 5 mg/gram (0.5 %) eye ointment   fluorometholone 0.1 % ophthalmic suspension Commonly known as:  FML Place 1 drop into both eyes 3 (three) times daily.   fluticasone 50 MCG/ACT nasal spray Commonly known as:  FLONASE Place 1 spray into both nostrils 2 (two) times daily.   freestyle lancets Check blood sugar once daily as directed DX:790.29 (FREESTYLE FREEDOM LITE)   glucose blood test strip CHECK BLOOD SUGAR ONCE DAILY AS DIRECTED.  DX:790.29   metFORMIN 500 MG tablet Commonly known as:  GLUCOPHAGE 1 daily at the completion of largest meal   montelukast 10 MG tablet Commonly known as:  SINGULAIR Take 1 tablet (10 mg total) by mouth at bedtime.   multivitamin tablet Take 1 tablet by mouth daily.   MYRBETRIQ 25 MG Tb24 tablet Generic drug:  mirabegron ER Take 25 mg by mouth daily.   NONFORMULARY OR COMPOUNDED ITEM Estradiol .02% 1 ML Prefilled Applicator Sig: apply vaginally twice a week #90 Day Supply with 4 refills   phenazopyridine 200 MG tablet Commonly known as:  PYRIDIUM Take 1 tablet (200 mg total) by mouth 3 (three) times daily with meals.   ranitidine 300 MG tablet Commonly known as:  ZANTAC Take  1 tablet (300 mg total) by mouth at bedtime.   trimethoprim 100 MG tablet Commonly known as:  TRIMPEX Take 100 mg by mouth daily.   valACYclovir 1000 MG tablet Commonly known as:  VALTREX TK 1 T PO D   verapamil 180 MG CR tablet Commonly known as:  CALAN-SR       Past Medical History:  Diagnosis Date  . Borderline abnormal TFTs    as a teen  . Diabetes mellitus without complication ([place])   . E. coli UTI 10/05/12   [facility]; resistant only to Septra DS & tetracycline  . Eye infection   . GERD (gastroesophageal reflux disease)   . Headache(784.0)   . HSV-1 infection   . HTN (hypertension)   . Hyperglycemia   . Hyperlipidemia   . Meningioma ([place])   .  Nephrolithiasis    Dr Amalia Hailey, [facility], Hx of [3][  . Polyp of colon   . Radiculopathy     Past Surgical History:  Procedure Laterality Date  . ABDOMINAL HYSTERECTOMY     TAH/BSO --fibroids, no cancer  . BALLOON DILATION N/A 07/20/2014   Procedure: BALLOON DILATION;  Surgeon: Garlan Fair, MD;  Location: [facility];  Service: Endoscopy;  Laterality: N/A;  . BREAST BIOPSY     Right  . CARPAL TUNNEL RELEASE     & trigger thumb RUE; Dr Alphonzo Cruise  . COLONOSCOPY W/ POLYPECTOMY  07/2004   Neg 2011; Dr Earle Gell  . CYSTOSCOPY  11/2009   Neg  . ESOPHAGOGASTRODUODENOSCOPY N/A 07/20/2014   Procedure: ESOPHAGOGASTRODUODENOSCOPY (EGD);  Surgeon: Garlan Fair, MD;  Location: [facility];  Service: Endoscopy;  Laterality: N/A;  . FLEXIBLE BRONCHOSCOPY W/ UPPER ENDOSCOPY     neg  . KNEE ARTHROSCOPY     Left  . ROTATOR CUFF REPAIR     R shoulder  . TONSILLECTOMY    . TRIGGER FINGER RELEASE Right    Right Thumb    Review of systems negative except as noted in HPI / PMHx or noted below:  Review of Systems  Constitutional: Negative.   HENT: Negative.   Eyes: Negative.   Respiratory: Negative.   Cardiovascular: Negative.   Gastrointestinal: Negative.   Genitourinary: Negative.   Musculoskeletal: Negative.   Skin: Negative.   Neurological: Negative.   Endo/Heme/Allergies: Negative.   Psychiatric/Behavioral: Negative.      Objective:   Vitals:   10/08/18 1046  BP: 126/68  Pulse: 88  Resp: 20          Physical Exam  HENT:  Head: Normocephalic.  Right Ear: Tympanic membrane, external ear and ear canal normal.  Left Ear: Tympanic membrane, external ear and ear canal normal.  Nose: Nose normal. No mucosal edema or rhinorrhea.  Mouth/Throat: Uvula is midline, oropharynx is clear and moist and mucous membranes are normal. No oropharyngeal exudate.  Eyes: Conjunctivae are normal.  Neck: Trachea normal. No tracheal tenderness present. No tracheal deviation present. No  thyromegaly present.  Cardiovascular: Normal rate, regular rhythm, S1 normal, S2 normal and normal heart sounds.  No murmur heard. Pulmonary/Chest: Breath sounds normal. No stridor. No respiratory distress. She has no wheezes. She has no rales.  Musculoskeletal: She exhibits no edema.  Lymphadenopathy:       Head (right side): No tonsillar adenopathy present.       Head (left side): No tonsillar adenopathy present.    She has no cervical adenopathy.  Neurological: She is alert.  Skin: No rash noted. She is not diaphoretic.  No erythema. Nails show no clubbing.    Diagnostics: none  Assessment and Plan:   1. Other allergic rhinitis   2. LPRD (laryngopharyngeal reflux disease)   3. 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 Continue to perform Allergen avoidance measures  2. Continue to Treat and prevent inflammation:   A. Flonase 1-2 sprays each nostril one time per day  B. montelukast 10 mg tablet once a day  3. Continue to Treat LPR:   A. continue to consolidate all caffeine and chocolate use  B. ranitidine 300 mg 2 times per day  C. can add OTC Tums if needed  4. If needed:   A. nasal saline  B. OTC antihistamine  C. Epi-Pen  D. Can add OTC Mucinex two times per day  E.  Ipratropium 0.06% 1-2 sprays each nostril every 6 hours to dry nose  5. Continue to replace throat clearing with swallowing maneuver  6. Consider a course of immunotherapy  7. Return to clinic in 6 months or earlier if problem  Gerlean appears to be doing relatively well on her current plan and she can remain on anti-inflammatory agents for her airway and therapy directed against reflux as noted above.  I have given her nasal ipratropium to help with some of the clear rhinorrhea that still remains with her current treatment  She is considering a course of immunotherapy and she would definitely be a candidate for this form of treatment regarding the atopic component of her  respiratory tract disease.  If she does well I will see her back in this clinic in 6 months or earlier if there is a problem.  Allena Katz, MD Allergy / Immunology [place]

## 2018-10-08 NOTE — Patient Instructions (Addendum)
  1. Continue to perform Allergen avoidance measures  2. Continue to Treat and prevent inflammation:   A. Flonase 1-2 sprays each nostril one time per day  B. montelukast 10 mg tablet once a day  3. Continue to Treat LPR:   A. continue to consolidate all caffeine and chocolate use  B. ranitidine 300 mg 2 times per day  C. can add OTC Tums if needed  4. If needed:   A. nasal saline  B. OTC antihistamine  C. Epi-Pen  D. Can add OTC Mucinex two times per day  E.  Ipratropium 0.06% 1-2 sprays each nostril every 6 hours to dry nose  5. Continue to replace throat clearing with swallowing maneuver  6. Consider a course of immunotherapy  7. Return to clinic in 6 months or earlier if problem

## 2018-10-09 ENCOUNTER — Encounter: Payer: Self-pay | Admitting: Allergy and Immunology

## 2018-10-09 ENCOUNTER — Telehealth: Payer: Self-pay | Admitting: Allergy and Immunology

## 2018-10-09 NOTE — Telephone Encounter (Signed)
Pt came in yesterday and you were talking to her about a eye problem and could not rember the name you ask her. Because she is going to see her dr this week and would like to ask them [phone]

## 2018-10-11 ENCOUNTER — Other Ambulatory Visit: Payer: Self-pay | Admitting: Family

## 2018-10-11 DIAGNOSIS — D72819 Decreased white blood cell count, unspecified: Secondary | ICD-10-CM

## 2018-10-11 NOTE — Telephone Encounter (Signed)
Called patient, she stated that at her appointment this week there was a discussion about her severe conjunctivitis and it was suggested or questioned if she had another eye condition but she cannot remember the name that was mentioned. She wanted to mention it to her eye doctor. Reviewed office note and did not notice mention of the eye condition. Please advise. Patient understands that she likely won't hear back until next week.

## 2018-10-11 NOTE — Telephone Encounter (Signed)
Patient called back regarding Patsy previous message, patient states she see's her eye doctor today and she would like to remember what it was that he said. Please advise  [phone]

## 2018-10-14 ENCOUNTER — Inpatient Hospital Stay: Payer: Medicare Other | Attending: Family | Admitting: Family

## 2018-10-14 ENCOUNTER — Inpatient Hospital Stay: Payer: Medicare Other

## 2018-10-14 ENCOUNTER — Other Ambulatory Visit: Payer: Self-pay

## 2018-10-14 VITALS — BP 155/71 | HR 81 | Temp 97.9°F | Resp 20 | Wt 167.8 lb

## 2018-10-14 DIAGNOSIS — D72819 Decreased white blood cell count, unspecified: Secondary | ICD-10-CM | POA: Diagnosis present

## 2018-10-14 LAB — CBC WITH DIFFERENTIAL (CANCER CENTER ONLY)
Abs Immature Granulocytes: 0 10*3/uL (ref 0.00–0.07)
Basophils Absolute: 0 10*3/uL (ref 0.0–0.1)
Basophils Relative: 0 %
Eosinophils Absolute: 0 10*3/uL (ref 0.0–0.5)
Eosinophils Relative: 0 %
HCT: 35.7 % — ABNORMAL LOW (ref 36.0–46.0)
Hemoglobin: 11.1 g/dL — ABNORMAL LOW (ref 12.0–15.0)
Immature Granulocytes: 0 %
Lymphocytes Relative: 63 %
Lymphs Abs: 2.1 10*3/uL (ref 0.7–4.0)
MCH: 26.8 pg (ref 26.0–34.0)
MCHC: 31.1 g/dL (ref 30.0–36.0)
MCV: 86.2 fL (ref 80.0–100.0)
Monocytes Absolute: 0.4 10*3/uL (ref 0.1–1.0)
Monocytes Relative: 11 %
Neutro Abs: 0.8 10*3/uL — ABNORMAL LOW (ref 1.7–7.7)
Neutrophils Relative %: 26 %
Platelet Count: 196 10*3/uL (ref 150–400)
RBC: 4.14 MIL/uL (ref 3.87–5.11)
RDW: 16.9 % — ABNORMAL HIGH (ref 11.5–15.5)
WBC Count: 3.3 10*3/uL — ABNORMAL LOW (ref 4.0–10.5)
nRBC: 0 % (ref 0.0–0.2)

## 2018-10-14 LAB — COMPREHENSIVE METABOLIC PANEL
ALT: 12 U/L (ref 0–44)
AST: 15 U/L (ref 15–41)
Albumin: 3.9 g/dL (ref 3.5–5.0)
Alkaline Phosphatase: 82 U/L (ref 38–126)
Anion gap: 7 (ref 5–15)
BUN: 13 mg/dL (ref 8–23)
CO2: 28 mmol/L (ref 22–32)
Calcium: 9.2 mg/dL (ref 8.9–10.3)
Chloride: 104 mmol/L (ref 98–111)
Creatinine, Ser: 0.66 mg/dL (ref 0.44–1.00)
GFR calc Af Amer: >60 mL/min (ref >60)
GFR calc non Af Amer: >60 mL/min (ref >60)
Glucose, Bld: 128 mg/dL — ABNORMAL HIGH (ref 70–99)
Potassium: 4.1 mmol/L (ref 3.5–5.1)
SODIUM: 139 mmol/L (ref 135–145)
Total Bilirubin: 0.3 mg/dL (ref 0.3–1.2)
Total Protein: 7.7 g/dL (ref 6.5–8.1)

## 2018-10-14 LAB — LACTATE DEHYDROGENASE: LDH: 189 U/L (ref 98–192)

## 2018-10-14 LAB — SAVE SMEAR(SSMR), FOR PROVIDER SLIDE REVIEW

## 2018-10-14 NOTE — Progress Notes (Signed)
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 Her WBC count today is 3.3 and lymphocytes 63%. Hgb is 11.1. She has history of adenoviral conjunctivitis and is still on Doxycycline 100 mg PO daily as well as erythromycin ointment at night. She states that she is weaning off of FML eye drops.  She also had history of temporal arteritis with steroid taper over the course of 4-6 months.  She had history of iron deficiency as a child and took a liquid supplement.  Non sickle cell disease or trait.  She had a hysterectomy at 72 yo due to a large fibroid. Her cycles had been regular and not heavy.  She states that her last mammogram was negative and she is due again in January.  She states that her colonoscopy was also negative and she is due again in 2 years.  She has allergies and history of post nasal drip which will cause her voice to be hoarse at times.  No fever, chills, n/v, cough, rash, dizziness, SOB, chest pain, palpitations, abdominal pain or changes in bowel or bladder habits.  No swelling, tenderness, numbness or tingling in her extremities. No c/o pain.  No lymphadenopathy noted on exam.  She has maintained a good appetite and is staying well hydrated. Her weight is stable.  She is diabetic and states that her blood sugars are well controlled.  She has never been a smoker. She does not drink alcoholic beverages.  She is originally from [place] and moved several times for her husband's job. They have lived here in [place] for the last 29 years.  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    ROS: All other 10 point review of systems is negative.   PAST MEDICAL HISTORY:   Past Medical History:  Diagnosis Date  . Borderline abnormal TFTs    as a teen  . Diabetes mellitus without complication ([place])   . E. coli UTI 10/05/12   [facility]; resistant only to Septra DS & tetracycline  . Eye infection   . GERD (gastroesophageal reflux disease)   . Headache(784.0)   . HSV-1 infection   . HTN (hypertension)   . Hyperglycemia   . Hyperlipidemia   . Meningioma ([place])   . Nephrolithiasis    Dr Amalia Hailey, [facility], Hx of [3][  . Polyp of colon   . Radiculopathy     ALLERGIES: Allergies  Allergen Reactions  . Codeine     Hives Because of a history of documented adverse serious drug reaction;Medi Alert bracelet  is recommended  . Propoxyphene Rash  . Propoxyphene Hcl Swelling and Rash    rash  . Latex     rash  . Macrodantin [Nitrofurantoin Macrocrystal] Other (See Comments)    Numbness, aching all over,   . Other     Pine nuts & walnuts throat congestion. No documented angioedema.  . Ciprofloxacin     ? Nausea (Note: she takes this when on mission trips)  . Tramadol     ? nausea      MEDICATIONS:  Current Outpatient Medications on  File Prior to Visit  Medication Sig Dispense Refill  . aspirin 325 MG EC tablet Take 325 mg by mouth daily.    Marland Kitchen doxycycline (MONODOX) 100 MG capsule     . erythromycin ophthalmic ointment erythromycin 5 mg/gram (0.5 %) eye ointment    . fluorometholone (FML) 0.1 % ophthalmic suspension Place 1 drop into both eyes 3 (three) times daily.    . fluticasone (FLONASE) 50 MCG/ACT nasal spray Place 1 spray into both nostrils 2 (two) times daily. 16 g 5  . glucose blood (FREESTYLE LITE) test strip CHECK BLOOD SUGAR ONCE DAILY AS DIRECTED.  DX:790.29 100 each 12  . Lancets (FREESTYLE) lancets Check blood sugar once daily as directed DX:790.29 (FREESTYLE FREEDOM LITE) 100 each 1  . metFORMIN (GLUCOPHAGE) 500 MG tablet 1 daily at the completion of largest  meal 90 tablet 1  . montelukast (SINGULAIR) 10 MG tablet Take 1 tablet (10 mg total) by mouth at bedtime. 30 tablet 5  . Multiple Vitamin (MULTIVITAMIN) tablet Take 1 tablet by mouth daily.      . NONFORMULARY OR COMPOUNDED ITEM Estradiol .02% 1 ML Prefilled Applicator Sig: apply vaginally twice a week #90 Day Supply with 4 refills 1 each 4  . trimethoprim (TRIMPEX) 100 MG tablet Take 100 mg by mouth daily.    . verapamil (CALAN-SR) 180 MG CR tablet     . mirabegron ER (MYRBETRIQ) 25 MG TB24 tablet Take 25 mg by mouth daily.    . phenazopyridine (PYRIDIUM) 200 MG tablet Take 1 tablet (200 mg total) by mouth 3 (three) times daily with meals. (Patient not taking: Reported on 10/08/2018) 6 tablet 0  . ranitidine (ZANTAC) 300 MG tablet Take 1 tablet (300 mg total) by mouth at bedtime. (Patient not taking: Reported on 10/08/2018) 30 tablet 5  . valACYclovir (VALTREX) 1000 MG tablet TK 1 T PO D     No current facility-administered medications on file prior to visit.      PAST SURGICAL HISTORY Past Surgical History:  Procedure Laterality Date  . ABDOMINAL HYSTERECTOMY     TAH/BSO --fibroids, no cancer  . BALLOON DILATION N/A 07/20/2014   Procedure: BALLOON DILATION;  Surgeon: Garlan Fair, MD;  Location: [facility];  Service: Endoscopy;  Laterality: N/A;  . BREAST BIOPSY     Right  . CARPAL TUNNEL RELEASE     & trigger thumb RUE; Dr Alphonzo Cruise  . COLONOSCOPY W/ POLYPECTOMY  07/2004   Neg 2011; Dr Earle Gell  . CYSTOSCOPY  11/2009   Neg  . ESOPHAGOGASTRODUODENOSCOPY N/A 07/20/2014   Procedure: ESOPHAGOGASTRODUODENOSCOPY (EGD);  Surgeon: Garlan Fair, MD;  Location: [facility];  Service: Endoscopy;  Laterality: N/A;  . FLEXIBLE BRONCHOSCOPY W/ UPPER ENDOSCOPY     neg  . KNEE ARTHROSCOPY     Left  . ROTATOR CUFF REPAIR     R shoulder  . TONSILLECTOMY    . TRIGGER FINGER RELEASE Right    Right Thumb    FAMILY HISTORY: Family History  Problem Relation Age of Onset  .  Alcohol abuse Mother   . Diabetes Maternal Aunt   . Diabetes Maternal Uncle   . Diabetes Maternal Grandmother   . Tuberculosis Maternal Uncle   . Other Father        unknown  . Coronary artery disease Neg Hx     SOCIAL HISTORY:  reports that she has never smoked. She has never used smokeless tobacco. She reports that she does not drink alcohol or use  drugs.  PERFORMANCE STATUS: The patient's performance status is 1 - Symptomatic but completely ambulatory  PHYSICAL EXAM: Most Recent Vital Signs: Blood pressure (!) 155/71, pulse 81, temperature 97.9 F (36.6 C), temperature source Oral, resp. rate 20, weight 167 lb 12 oz (76.1 kg), last menstrual period 07/30/1990, SpO2 100 %. BP (!) 155/71 (BP Location: Right Arm, Patient Position: Sitting)   Pulse 81   Temp 97.9 F (36.6 C) (Oral)   Resp 20   Wt 167 lb 12 oz (76.1 kg)   LMP 07/30/1990   SpO2 100%   BMI 27.92 kg/m   General Appearance:    Alert, cooperative, no distress, appears stated age  Head:    Normocephalic, without obvious abnormality, atraumatic  Eyes:    PERRL, conjunctiva/corneas clear, EOM's intact, fundi    benign, both eyes        Throat:   Lips, mucosa, and tongue normal; teeth and gums normal  Neck:   Supple, symmetrical, trachea midline, no adenopathy;    thyroid:  no enlargement/tenderness/nodules; no carotid   bruit or JVD  Back:     Symmetric, no curvature, ROM normal, no CVA tenderness  Lungs:     Clear to auscultation bilaterally, respirations unlabored  Chest Wall:    No tenderness or deformity   Heart:    Regular rate and rhythm, S1 and S2 normal, no murmur, rub   or gallop     Abdomen:     Soft, non-tender, bowel sounds active all four quadrants,    no masses, no organomegaly        Extremities:   Extremities normal, atraumatic, no cyanosis or edema  Pulses:   2+ and symmetric all extremities  Skin:   Skin color, texture, turgor normal, no rashes or lesions  Lymph nodes:   Cervical,  supraclavicular, and axillary nodes normal  Neurologic:   CNII-XII intact, normal strength, sensation and reflexes    throughout    LABORATORY DATA:  Results for orders placed or performed in visit on 10/14/18 (from the past 48 hour(s))  Save Smear (SSMR)     Status: None   Collection Time: 10/14/18 10:46 AM  Result Value Ref Range   Smear Review SMEAR STAINED AND AVAILABLE FOR REVIEW     Comment: Performed at [facility] at [facility], [address] [place], [place] [address]  CBC with Differential (Cancer Center Only)     Status: Abnormal   Collection Time: 10/14/18 10:46 AM  Result Value Ref Range   WBC Count 3.3 (L) 4.0 - 10.5 K/uL   RBC 4.14 3.87 - 5.11 MIL/uL   Hemoglobin 11.1 (L) 12.0 - 15.0 g/dL   HCT 35.7 (L) 36.0 - 46.0 %   MCV 86.2 80.0 - 100.0 fL   MCH 26.8 26.0 - 34.0 pg   MCHC 31.1 30.0 - 36.0 g/dL   RDW 16.9 (H) 11.5 - 15.5 %   Platelet Count 196 150 - 400 K/uL   nRBC 0.0 0.0 - 0.2 %   Neutrophils Relative % 26 %   Neutro Abs 0.8 (L) 1.7 - 7.7 K/uL   Lymphocytes Relative 63 %   Lymphs Abs 2.1 0.7 - 4.0 K/uL   Monocytes Relative 11 %   Monocytes Absolute 0.4 0.1 - 1.0 K/uL   Eosinophils Relative 0 %   Eosinophils Absolute 0.0 0.0 - 0.5 K/uL   Basophils Relative 0 %   Basophils Absolute 0.0 0.0 - 0.1 K/uL   Immature  Granulocytes 0 %   Abs Immature Granulocytes 0.00 0.00 - 0.07 K/uL    Comment: Performed at [facility] at [facility], [address], [place], [place] [address]      RADIOGRAPHY: No results found.     PATHOLOGY: None  ASSESSMENT/PLAN: Ms. 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 Her WBC count today is 3.3 and lymphocytes 63%.  We will see what her flow cytometry shows and determine if there is possibly an underlying lymphoma.  We will go ahead and plan to see her back in another 6 months.   All questions were  answered and she is in agreement with the plan. She will contact our office with any questions or concerns. We can certainly see her sooner if need be.   She was discussed with and also seen by Dr. Marin Olp and he is in agreement with the aforementioned.   Laverna Peace    Addendum: I saw and examined Ms. Wetsel with Judson Roch.  I looked at  her blood smear under the microscope.  She had some large lymphocytes.  They seem to have some granules.  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  We did do a flow cytometry.  The flow cytometry showed a predominance of T lymphocytes.  There is no monoclonal population of cells.  She does have quite a preponderance of lymphocytes.  I am not sure exactly what this represents given that the flow cytometry really was unremarkable.  I really do not think that there is can be that much of a risk of infection.  I suppose that we might have to consider a bone marrow biopsy and aspirate on her if her blood count worsens.  I think that we can probably follow her up in about 6 months.  I think this would be very reasonable.  We spent about 45 minutes with her.  All the time spent face-to-face with her.  We answered her questions.  We helped coordinate future care for her.  Lattie Haw, MD

## 2018-10-14 NOTE — Telephone Encounter (Signed)
Please inform patient that her eye doctor needs to rule out iritis, uveitis, among other issues of the eye.

## 2018-10-14 NOTE — Telephone Encounter (Signed)
Called and informed patient to Dr. Bruna Potter recommendation. Patient stated she would speak with her eye doctor about these.

## 2018-10-16 LAB — FLOW CYTOMETRY

## 2018-10-21 ENCOUNTER — Telehealth: Payer: Self-pay | Admitting: Family

## 2018-10-21 NOTE — Telephone Encounter (Signed)
I spoke with Kristen Murray and went over her recent lab work with her. She is doing well and has no complaints or questions at this time. We will plan to see her back in June 2020.

## 2018-11-12 ENCOUNTER — Encounter: Payer: Self-pay | Admitting: Gynecology

## 2018-11-12 ENCOUNTER — Ambulatory Visit (INDEPENDENT_AMBULATORY_CARE_PROVIDER_SITE_OTHER): Payer: Medicare Other | Admitting: Gynecology

## 2018-11-12 VITALS — BP 124/80 | Ht 65.0 in | Wt 168.0 lb

## 2018-11-12 DIAGNOSIS — Z01419 Encounter for gynecological examination (general) (routine) without abnormal findings: Secondary | ICD-10-CM

## 2018-11-12 DIAGNOSIS — N952 Postmenopausal atrophic vaginitis: Secondary | ICD-10-CM

## 2018-11-12 NOTE — Progress Notes (Signed)
    Kristen Murray 10/08/1946 [id number]        73 y.o.  [id number] for breast and pelvic exam.  Without gynecologic complaints  Past medical history,surgical history, problem list, medications, allergies, family history and social history were all reviewed and documented as reviewed in the EPIC chart.  ROS:  Performed with pertinent positives and negatives included in the history, assessment and plan.   Additional significant findings : None   Exam: Caryn Bee assistant Vitals:   11/12/18 1102  BP: 124/80  Weight: 168 lb (76.2 kg)  Height: 5\' 5"  (1.651 m)   Body mass index is 27.96 kg/m.  General appearance:  Normal affect, orientation and appearance. Skin: Grossly normal.  Classic lipoma left upper back HEENT: Without gross lesions.  No cervical or supraclavicular adenopathy. Thyroid normal.  Lungs:  Clear without wheezing, rales or rhonchi Cardiac: RR, without RMG Abdominal:  Soft, nontender, without masses, guarding, rebound, organomegaly or hernia Breasts:  Examined lying and sitting without masses, retractions, discharge or axillary adenopathy. Pelvic:  Ext, BUS, Vagina: With atrophic changes  Adnexa: Without masses or tenderness    Anus and perineum: Normal   Rectovaginal: Normal sphincter tone without palpated masses or tenderness.    Assessment/Plan:  73 y.o. [id number] female for breast and pelvic exam  1. Postmenopausal.  Status post TAH/BSO for leiomyoma.  No significant menopausal symptoms. 2. Lipoma upper left back.  Present for years unchanged.  Not bothersome to the patient.  Patient will monitor and report any changes. 3. Mammography coming due in February and I reminded her to schedule this.  Breast exam normal today. 4. DEXA 2017 normal.  Recommend repeat DEXA at 5-year interval. 5. Colonoscopy coming due next year. 6. Pap smear/HPV 2016.  No Pap smear done today.  No history of abnormal Pap smears.  Options to stop screening per current screening guidelines  versus repeating next year at 5-year interval reviewed.  Will readdress next year. 7. Health maintenance.  No routine lab work done as patient does this elsewhere.  Follow-up 1 year, sooner as needed.   Anastasio Auerbach MD, 11:23 AM 11/12/2018

## 2018-11-12 NOTE — Patient Instructions (Signed)
Follow-up in 1 year for annual exam, sooner if any issues. 

## 2019-01-06 ENCOUNTER — Encounter: Payer: Self-pay | Admitting: Gynecology

## 2019-04-01 ENCOUNTER — Other Ambulatory Visit: Payer: Self-pay

## 2019-04-01 ENCOUNTER — Encounter: Payer: Self-pay | Admitting: Allergy and Immunology

## 2019-04-01 ENCOUNTER — Ambulatory Visit (INDEPENDENT_AMBULATORY_CARE_PROVIDER_SITE_OTHER): Payer: Medicare Other | Admitting: Allergy and Immunology

## 2019-04-01 VITALS — BP 134/64 | HR 105 | Temp 97.8°F | Resp 16 | Ht 65.0 in | Wt 164.0 lb

## 2019-04-01 DIAGNOSIS — J3089 Other allergic rhinitis: Secondary | ICD-10-CM | POA: Diagnosis not present

## 2019-04-01 DIAGNOSIS — K219 Gastro-esophageal reflux disease without esophagitis: Secondary | ICD-10-CM | POA: Diagnosis not present

## 2019-04-01 DIAGNOSIS — T7805XD Anaphylactic reaction due to tree nuts and seeds, subsequent encounter: Secondary | ICD-10-CM

## 2019-04-01 DIAGNOSIS — B301 Conjunctivitis due to adenovirus: Secondary | ICD-10-CM

## 2019-04-01 MED ORDER — EPINEPHRINE 0.3 MG/0.3ML IJ SOAJ: 0.3000 mg | Freq: Once | INTRAMUSCULAR | 2 refills | Status: AC

## 2019-04-01 MED ORDER — IPRATROPIUM BROMIDE 0.06 % NA SOLN: 2.0000 | Freq: Three times a day (TID) | NASAL | 5 refills | Status: DC

## 2019-04-01 MED ORDER — MONTELUKAST SODIUM 10 MG PO TABS: 10.0000 mg | ORAL_TABLET | Freq: Every day | ORAL | 5 refills | Status: DC

## 2019-04-01 MED ORDER — FLUTICASONE PROPIONATE 50 MCG/ACT NA SUSP: 1.0000 | Freq: Two times a day (BID) | NASAL | 5 refills | Status: DC

## 2019-04-01 NOTE — Patient Instructions (Addendum)
  1. Continue to perform Allergen avoidance measures  2. Continue to Treat and prevent inflammation:   A. Flonase 1-2 sprays each nostril one time per day  B. montelukast 10 mg tablet once a day  3. Continue to Treat LPR:   A. continue to consolidate all caffeine and chocolate use  B. can add OTC Tums if needed  C. Continue to replace throat clearing with swallowing maneuver  4. If needed:   A. nasal saline  B. OTC antihistamine  C. Epi-Pen  D. Can add OTC Mucinex two times per day  E.  Ipratropium 0.06% 1-2 sprays each nostril every 6 hours to dry nose  5. For this recent episode:   A. OTC Ibuprofen  B. OTC famotidine 20 mg daily if GI upset from ibuprofen  C. Continue eye treatment from [facility]  D. Do not touch / rub eye  6. Return to clinic in 6 months or earlier if problem

## 2019-04-01 NOTE — Progress Notes (Signed)
[facility]   Follow-up Note  Referring Provider: [name], MD Primary Provider: [name], MD Date of Office Visit: 04/01/2019  Subjective:   Kristen Murray (DOB: August 08, 1946) is a 73 y.o. female who returns to the [facility] on 04/01/2019 in re-evaluation of the following:  HPI: Jireh returns to this clinic in reevaluation of allergic rhinitis and LPR and food allergy directed against pine nuts, tree nuts, banana, and avocado.  Her last visit to this clinic was 08 October 2018.  Overall Tarisha has really done very well regarding her airway.  She has a very good understanding of her atopic disease and her reflux induced respiratory disease and while utilizing a combination of therapy directed against inflammation of her airway and being very careful about any caffeine or chocolate consumption she has had very little problems with nasal congestion or sneezing or postnasal drip or throat clearing or classic reflux symptoms.  Currently she does not use any therapy directed against reflux except avoidance measures regarding caffeine and chocolate.  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  Apparently this flares up occasionally and she must use a higher dose of her chronic topical steroid drop.  About 2 weeks ago her left eye flared up and over the course of the past 5 days or so her right eye has flared up.  He has increased her topical steroid to a few times per day.  In association with his right eye issue she has developed some right ear achiness and at the angle of her mandible there is a little bit of soreness and she has a little bit more postnasal drip.  She has not had any fever and when she irrigates her nose with saline there is no ugly nasal discharge.  She does not have any hearing loss or vertigo or tinnitus and she can smell.  She remains away from consumption of tree nuts and pine  nuts and banana and avocado.  Allergies as of 04/01/2019      Reactions   Codeine    Hives Because of a history of documented adverse serious drug reaction;Medi Alert bracelet  is recommended   Propoxyphene Rash   Propoxyphene Hcl Swelling, Rash   rash   Latex    rash   Macrodantin [nitrofurantoin Macrocrystal] Other (See Comments)   Numbness, aching all over,    Other    Pine nuts & walnuts throat congestion. No documented angioedema.   Ciprofloxacin    ? Nausea (Note: she takes this when on mission trips)   Tramadol    ? nausea      Medication List      doxycycline 100 MG capsule Commonly known as:  MONODOX   erythromycin ophthalmic ointment erythromycin 5 mg/gram (0.5 %) eye ointment   fluorometholone 0.1 % ophthalmic suspension Commonly known as:  FML Place 1 drop into both eyes 3 (three) times daily.   fluticasone 50 MCG/ACT nasal spray Commonly known as:  FLONASE Place 1 spray into both nostrils 2 (two) times daily.   freestyle lancets Check blood sugar once daily as directed DX:790.29 (FREESTYLE FREEDOM LITE)   glucose blood test strip Commonly known as:  FREESTYLE LITE CHECK BLOOD SUGAR ONCE DAILY AS DIRECTED.  DX:790.29   JANUVIA PO Take by mouth.   montelukast 10 MG tablet Commonly known as:  SINGULAIR Take 1 tablet (10 mg total) by mouth at bedtime.  multivitamin tablet Take 1 tablet by mouth daily.   NONFORMULARY OR COMPOUNDED ITEM Estradiol .02% 1 ML Prefilled Applicator Sig: apply vaginally twice a week #90 Day Supply with 4 refills   trimethoprim 100 MG tablet Commonly known as:  TRIMPEX Take 100 mg by mouth daily.   valACYclovir 1000 MG tablet Commonly known as:  VALTREX TK 1 T PO D   verapamil 180 MG CR tablet Commonly known as:  CALAN-SR       Past Medical History:  Diagnosis Date  . Borderline abnormal TFTs    as a teen  . Diabetes mellitus without complication ([place])   . E. coli UTI 10/05/12   [facility]; resistant only  to Septra DS & tetracycline  . Eye infection   . GERD (gastroesophageal reflux disease)   . Headache(784.0)   . HSV-1 infection   . HTN (hypertension)   . Hyperglycemia   . Hyperlipidemia   . Meningioma ([place])   . Nephrolithiasis    Dr Amalia Hailey, [facility], Hx of [3][  . Polyp of colon   . Radiculopathy     Past Surgical History:  Procedure Laterality Date  . ABDOMINAL HYSTERECTOMY     TAH/BSO --fibroids, no cancer  . BALLOON DILATION N/A 07/20/2014   Procedure: BALLOON DILATION;  Surgeon: Garlan Fair, MD;  Location: [facility];  Service: Endoscopy;  Laterality: N/A;  . BREAST BIOPSY     Right  . CARPAL TUNNEL RELEASE     & trigger thumb RUE; Dr Alphonzo Cruise  . COLONOSCOPY W/ POLYPECTOMY  07/2004   Neg 2011; Dr Earle Gell  . CYSTOSCOPY  11/2009   Neg  . ESOPHAGOGASTRODUODENOSCOPY N/A 07/20/2014   Procedure: ESOPHAGOGASTRODUODENOSCOPY (EGD);  Surgeon: Garlan Fair, MD;  Location: [facility];  Service: Endoscopy;  Laterality: N/A;  . FLEXIBLE BRONCHOSCOPY W/ UPPER ENDOSCOPY     neg  . KNEE ARTHROSCOPY     Left  . ROTATOR CUFF REPAIR     R shoulder  . TONSILLECTOMY    . TRIGGER FINGER RELEASE Right    Right Thumb    Review of systems negative except as noted in HPI / PMHx or noted below:  Review of Systems  Constitutional: Negative.   HENT: Negative.   Eyes: Negative.   Respiratory: Negative.   Cardiovascular: Negative.   Gastrointestinal: Negative.   Genitourinary: Negative.   Musculoskeletal: Negative.   Skin: Negative.   Neurological: Negative.   Endo/Heme/Allergies: Negative.   Psychiatric/Behavioral: Negative.      Objective:   Vitals:   04/01/19 1034  BP: 134/64  Pulse: (!) 105  Resp: 16  Temp: 97.8 F (36.6 C)  SpO2: 97%   Height: 5\' 5"  (165.1 cm)  Weight: 164 lb (74.4 kg)   Physical Exam Constitutional:      Appearance: She is not diaphoretic.  HENT:     Head: Normocephalic.     Right Ear: Tympanic membrane, ear canal and external  ear normal.     Left Ear: Tympanic membrane, ear canal and external ear normal.     Nose: Nose normal. No mucosal edema or rhinorrhea.     Mouth/Throat:     Pharynx: Uvula midline. No oropharyngeal exudate.  Eyes:     Conjunctiva/sclera:     Right eye: Right conjunctiva is injected.  Neck:     Thyroid: No thyromegaly.     Trachea: Trachea normal. No tracheal tenderness or tracheal deviation.  Cardiovascular:     Rate and Rhythm: Normal rate and regular  rhythm.     Heart sounds: Normal heart sounds, S1 normal and S2 normal. No murmur.  Pulmonary:     Effort: No respiratory distress.     Breath sounds: Normal breath sounds. No stridor. No wheezing or rales.  Lymphadenopathy:     Head:     Right side of head: No tonsillar adenopathy.     Left side of head: No tonsillar adenopathy.     Cervical: No cervical adenopathy.  Skin:    Findings: No erythema or rash.     Nails: There is no clubbing.   Neurological:     Mental Status: She is alert.     Diagnostics: none  Assessment and Plan:   1. Other allergic rhinitis   2. LPRD (laryngopharyngeal reflux disease)   3. 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 Conjunctivitis of right eye due to adenovirus     1. Continue to perform Allergen avoidance measures  2. Continue to Treat and prevent inflammation:   A. Flonase 1-2 sprays each nostril one time per day  B. montelukast 10 mg tablet once a day  3. Continue to Treat LPR:   A. continue to consolidate all caffeine and chocolate use  B. can add OTC Tums if needed  C. Continue to replace throat clearing with swallowing maneuver  4. If needed:   A. nasal saline  B. OTC antihistamine  C. Epi-Pen  D. Can add OTC Mucinex two times per day  E.  Ipratropium 0.06% 1-2 sprays each nostril every 6 hours to dry nose  5. For this recent episode:   A. OTC Ibuprofen  B. OTC famotidine 20 mg daily if GI upset from ibuprofen  C. Continue eye  treatment from [facility]  D. Do not touch / rub eye  6. Return to clinic in 6 months or earlier if problem  Overall Idalia is doing good regarding her respiratory tract inflammatory state and her reflux induced respiratory disease on her current plan as noted above.  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  There may be some neurological issue that is going on with some of these complaints but I think the approach is still the same at this point.  We will just give her time to see if she can resolve this issue while using ibuprofen and she can continue with her eye therapy prescribed from [facility] and contact them for further evaluation should her eye become worse.  I do not see any indication for a systemic steroid or a antibiotic administration at this point.  She will keep in contact with me noting her response to this approach.  If she does well I will see her back in this clinic in 6 months or earlier if there is a problem.  Allena Katz, MD Allergy / Immunology [place]

## 2019-04-02 ENCOUNTER — Encounter: Payer: Self-pay | Admitting: Allergy and Immunology

## 2019-04-11 ENCOUNTER — Other Ambulatory Visit: Payer: Self-pay | Admitting: Internal Medicine

## 2019-04-11 DIAGNOSIS — M79662 Pain in left lower leg: Secondary | ICD-10-CM

## 2019-04-14 ENCOUNTER — Inpatient Hospital Stay: Payer: Medicare Other

## 2019-04-14 ENCOUNTER — Encounter: Payer: Self-pay | Admitting: Hematology & Oncology

## 2019-04-14 ENCOUNTER — Ambulatory Visit
Admission: RE | Admit: 2019-04-14 | Discharge: 2019-04-14 | Disposition: A | Discharge status: Home / Self Care | Payer: Medicare Other | Source: Ambulatory Visit | Attending: Internal Medicine | Admitting: Internal Medicine

## 2019-04-14 ENCOUNTER — Other Ambulatory Visit: Payer: Self-pay

## 2019-04-14 ENCOUNTER — Inpatient Hospital Stay: Payer: Medicare Other | Attending: Hematology & Oncology | Admitting: Hematology & Oncology

## 2019-04-14 VITALS — BP 138/67 | HR 92 | Temp 97.9°F | Resp 16 | Wt 173.0 lb

## 2019-04-14 DIAGNOSIS — M79662 Pain in left lower leg: Secondary | ICD-10-CM

## 2019-04-14 DIAGNOSIS — M2548 Effusion, other site: Secondary | ICD-10-CM | POA: Diagnosis not present

## 2019-04-14 DIAGNOSIS — D708 Other neutropenia: Secondary | ICD-10-CM

## 2019-04-14 DIAGNOSIS — M79652 Pain in left thigh: Secondary | ICD-10-CM | POA: Insufficient documentation

## 2019-04-14 DIAGNOSIS — D649 Anemia, unspecified: Secondary | ICD-10-CM | POA: Diagnosis not present

## 2019-04-14 DIAGNOSIS — D72819 Decreased white blood cell count, unspecified: Secondary | ICD-10-CM | POA: Diagnosis present

## 2019-04-14 LAB — CBC WITH DIFFERENTIAL (CANCER CENTER ONLY)
Abs Immature Granulocytes: 0 10*3/uL (ref 0.00–0.07)
Basophils Absolute: 0 10*3/uL (ref 0.0–0.1)
Basophils Relative: 0 %
Eosinophils Absolute: 0 10*3/uL (ref 0.0–0.5)
Eosinophils Relative: 1 %
HCT: 34.1 % — ABNORMAL LOW (ref 36.0–46.0)
Hemoglobin: 10.8 g/dL — ABNORMAL LOW (ref 12.0–15.0)
Immature Granulocytes: 0 %
Lymphocytes Relative: 55 %
Lymphs Abs: 1.4 10*3/uL (ref 0.7–4.0)
MCH: 26.7 pg (ref 26.0–34.0)
MCHC: 31.7 g/dL (ref 30.0–36.0)
MCV: 84.2 fL (ref 80.0–100.0)
Monocytes Absolute: 0.3 10*3/uL (ref 0.1–1.0)
Monocytes Relative: 10 %
Neutro Abs: 0.9 10*3/uL — ABNORMAL LOW (ref 1.7–7.7)
Neutrophils Relative %: 34 %
Platelet Count: 178 10*3/uL (ref 150–400)
RBC: 4.05 MIL/uL (ref 3.87–5.11)
RDW: 17.4 % — ABNORMAL HIGH (ref 11.5–15.5)
WBC Count: 2.5 10*3/uL — ABNORMAL LOW (ref 4.0–10.5)
nRBC: 0 % (ref 0.0–0.2)

## 2019-04-14 LAB — SAVE SMEAR(SSMR), FOR PROVIDER SLIDE REVIEW

## 2019-04-14 NOTE — Progress Notes (Signed)
Hematology and Oncology Follow Up Visit  MICHELLE WNEK [id number] 18-Aug-1946 73 y.o. 04/14/2019   Principle Diagnosis:   Leukopenia-progressive  Current Therapy:    Observation     Interim History:  Ms. Diez is back for follow-up.  She traveled to [place] in February.  Had a wonderful time over there.  I think she was over there for couple weeks.  She went to where [place] was.  This is very spiritual for her.  The problem have now that there is swelling in her left calf area.  She has had this for about 6 days.  Was quite painful.  She says got.  There is some pain in her left thigh.  There is no cough or shortness of breath.  Her family doctor set her up with a Doppler today.  Somehow, it would not surprise me if she had a thrombus.  She has not had any bleeding.  There is been no change in bowel or bladder habits.  She is had no fever.  There is been no rashes.  Overall, her performance status is ECOG 1.  Medications:  Current Outpatient Medications:  .  doxycycline (MONODOX) 100 MG capsule, , Disp: , Rfl:  .  erythromycin ophthalmic ointment, erythromycin 5 mg/gram (0.5 %) eye ointment, Disp: , Rfl:  .  fluorometholone (FML) 0.1 % ophthalmic suspension, Place 1 drop into both eyes 3 (three) times daily., Disp: , Rfl:  .  fluticasone (FLONASE) 50 MCG/ACT nasal spray, Place 1 spray into both nostrils 2 (two) times daily., Disp: 16 g, Rfl: 5 .  glucose blood (FREESTYLE LITE) test strip, CHECK BLOOD SUGAR ONCE DAILY AS DIRECTED.  DX:790.29, Disp: 100 each, Rfl: 12 .  ipratropium (ATROVENT) 0.06 % nasal spray, Place 2 sprays into both nostrils 3 (three) times daily., Disp: 15 mL, Rfl: 5 .  Lancets (FREESTYLE) lancets, Check blood sugar once daily as directed DX:790.29 (FREESTYLE FREEDOM LITE), Disp: 100 each, Rfl: 1 .  montelukast (SINGULAIR) 10 MG tablet, Take 1 tablet (10 mg total) by mouth at bedtime., Disp: 30 tablet, Rfl: 5 .  Multiple Vitamin (MULTIVITAMIN) tablet, Take 1 tablet  by mouth daily.  , Disp: , Rfl:  .  NONFORMULARY OR COMPOUNDED ITEM, Estradiol .02% 1 ML Prefilled Applicator Sig: apply vaginally twice a week #90 Day Supply with 4 refills, Disp: 1 each, Rfl: 4 .  SITagliptin Phosphate (JANUVIA PO), Take by mouth., Disp: , Rfl:  .  trimethoprim (TRIMPEX) 100 MG tablet, Take 100 mg by mouth daily., Disp: , Rfl:  .  valACYclovir (VALTREX) 1000 MG tablet, TK 1 T PO D, Disp: , Rfl:  .  verapamil (CALAN-SR) 180 MG CR tablet, , Disp: , Rfl:   Allergies:  Allergies  Allergen Reactions  . Codeine     Hives Because of a history of documented adverse serious drug reaction;Medi Alert bracelet  is recommended  . Propoxyphene Rash  . Propoxyphene Hcl Swelling and Rash    rash  . Latex     rash  . Macrodantin [Nitrofurantoin Macrocrystal] Other (See Comments)    Numbness, aching all over,   . Other     Pine nuts & walnuts throat congestion. No documented angioedema.  . Ciprofloxacin     ? Nausea (Note: she takes this when on mission trips)  . Tramadol     ? nausea    Past Medical History, Surgical history, Social history, and Family History were reviewed and updated.  Review of Systems: Review of Systems  Constitutional: Negative.   HENT:  Negative.   Eyes: Negative.   Respiratory: Negative.   Cardiovascular: Negative.   Gastrointestinal: Negative.   Endocrine: Negative.   Genitourinary: Negative.    Musculoskeletal: Negative.   Skin: Negative.   Neurological: Negative.   Hematological: Negative.   Psychiatric/Behavioral: Negative.     Physical Exam:  weight is 173 lb (78.5 kg). Her oral temperature is 97.9 F (36.6 C). Her blood pressure is 138/67 and her pulse is 92. Her respiration is 16 and oxygen saturation is 98%.   Wt Readings from Last 3 Encounters:  04/14/19 173 lb (78.5 kg)  04/01/19 164 lb (74.4 kg)  11/12/18 168 lb (76.2 kg)    Physical Exam Vitals signs reviewed.  HENT:     Head: Normocephalic and atraumatic.  Eyes:      Pupils: Pupils are equal, round, and reactive to light.  Neck:     Musculoskeletal: Normal range of motion.  Cardiovascular:     Rate and Rhythm: Normal rate and regular rhythm.     Heart sounds: Normal heart sounds.  Pulmonary:     Effort: Pulmonary effort is normal.     Breath sounds: Normal breath sounds.  Abdominal:     General: Bowel sounds are normal.     Palpations: Abdomen is soft.  Musculoskeletal: Normal range of motion.        General: No tenderness or deformity.     Comments: There is moderate swelling of the left calf area.  This is somewhat tender to palpation.  There is no redness.  I cannot palpate any obvious venous cord.  There is no pain behind the left knee.  She has decent pulses in her distal extremities.  Lymphadenopathy:     Cervical: No cervical adenopathy.  Skin:    General: Skin is warm and dry.     Findings: No erythema or rash.  Neurological:     Mental Status: She is alert and oriented to person, place, and time.  Psychiatric:        Behavior: Behavior normal.        Thought Content: Thought content normal.        Judgment: Judgment normal.      Lab Results  Component Value Date   WBC 2.5 (L) 04/14/2019   HGB 10.8 (L) 04/14/2019   HCT 34.1 (L) 04/14/2019   MCV 84.2 04/14/2019   PLT 178 04/14/2019     Chemistry      Component Value Date/Time   NA 139 10/14/2018 1047   K 4.1 10/14/2018 1047   CL 104 10/14/2018 1047   CO2 28 10/14/2018 1047   BUN 13 10/14/2018 1047   CREATININE 0.66 10/14/2018 1047      Component Value Date/Time   CALCIUM 9.2 10/14/2018 1047   ALKPHOS 82 10/14/2018 1047   AST 15 10/14/2018 1047   ALT 12 10/14/2018 1047   BILITOT 0.3 10/14/2018 1047       Impression and Plan: Ms. Gilbertson is a 73 year old African-American female.  She has progressive leukopenia.  She is also anemic now.  I does have to wonder if she does not have myelodysplasia.  I will get a blood smear.  I really do not see anything that looked  unusual.  I do not see any nucleated red blood cells.  There was no rouleaux formation.  I did not see any immature myeloid cells.  She has decent platelets.  She is asymptomatic right now.  I told her  to try some over-the-counter folic acid and oral iron.  Her MCV was slightly on the low normal side so maybe iron might help with the anemia.  I would like to have her back in 3 months.  If her blood counts are no better, we will have to get her set up with a bone marrow biopsy.  She understands all this.  I spent about 30 minutes with her.  I had to spend more time because her blood counts were worse after explained my recommendations.  I have set up her additional appointments.   [name], MD 6/15/202011:58 AM

## 2019-05-26 ENCOUNTER — Other Ambulatory Visit (HOSPITAL_COMMUNITY): Payer: Self-pay | Admitting: Internal Medicine

## 2019-05-26 DIAGNOSIS — R079 Chest pain, unspecified: Secondary | ICD-10-CM

## 2019-05-31 DIAGNOSIS — A609 Anogenital herpesviral infection, unspecified: Secondary | ICD-10-CM

## 2019-05-31 HISTORY — DX: Anogenital herpesviral infection, unspecified: A60.9

## 2019-06-04 ENCOUNTER — Telehealth (HOSPITAL_COMMUNITY): Payer: Self-pay

## 2019-06-04 NOTE — Telephone Encounter (Signed)
Encounter complete. 

## 2019-06-06 ENCOUNTER — Ambulatory Visit (HOSPITAL_COMMUNITY)
Admission: RE | Admit: 2019-06-06 | Discharge: 2019-06-06 | Disposition: A | Discharge status: Home / Self Care | Payer: Medicare Other | Source: Ambulatory Visit | Attending: Cardiovascular Disease | Admitting: Cardiovascular Disease

## 2019-06-06 ENCOUNTER — Other Ambulatory Visit: Payer: Self-pay

## 2019-06-06 DIAGNOSIS — R079 Chest pain, unspecified: Secondary | ICD-10-CM

## 2019-06-06 LAB — MYOCARDIAL PERFUSION IMAGING
LV dias vol: 61 mL (ref 46–106)
LV sys vol: 18 mL
Peak HR: 100 {beats}/min
Rest HR: 71 {beats}/min
SDS: 0
SRS: 0
SSS: 0
TID: 1.18

## 2019-06-06 MED ORDER — REGADENOSON 0.4 MG/5ML IV SOLN
0.4000 mg | Freq: Once | INTRAVENOUS | Status: AC
  Administered 2019-06-06: 0.4 mg via INTRAVENOUS

## 2019-06-06 MED ORDER — TECHNETIUM TC 99M TETROFOSMIN IV KIT
32.7000 | PACK | Freq: Once | INTRAVENOUS | Status: AC | PRN
  Administered 2019-06-06: 32.7 via INTRAVENOUS
  Filled 2019-06-06: qty 33

## 2019-06-06 MED ORDER — TECHNETIUM TC 99M TETROFOSMIN IV KIT
10.1000 | PACK | Freq: Once | INTRAVENOUS | Status: AC | PRN
  Administered 2019-06-06: 10.1 via INTRAVENOUS
  Filled 2019-06-06: qty 11

## 2019-06-16 ENCOUNTER — Encounter: Payer: Self-pay | Admitting: Gynecology

## 2019-06-16 ENCOUNTER — Ambulatory Visit (INDEPENDENT_AMBULATORY_CARE_PROVIDER_SITE_OTHER): Payer: Medicare Other | Admitting: Gynecology

## 2019-06-16 ENCOUNTER — Other Ambulatory Visit: Payer: Self-pay

## 2019-06-16 VITALS — BP 118/76

## 2019-06-16 DIAGNOSIS — N766 Ulceration of vulva: Secondary | ICD-10-CM

## 2019-06-16 MED ORDER — HYDROCORT-PRAMOXINE (PERIANAL) 1-1 % EX FOAM: 1.0000 | Freq: Two times a day (BID) | CUTANEOUS | 1 refills | Status: DC

## 2019-06-16 NOTE — Patient Instructions (Addendum)
Continue with sits baths several times daily.  Office will call you with the culture results.  Follow-up if the irritated areas do not completely resolve.

## 2019-06-16 NOTE — Addendum Note (Signed)
Addended by: Nelva Nay on: 06/16/2019 04:33 PM   Modules accepted: Orders

## 2019-06-16 NOTE — Progress Notes (Signed)
    Kristen Murray 05-20-1946 [id number]        73 y.o.  G3P3003 presents with 1 week history of a painful sore on the vulva.  Started over a week ago.  Has been using does not ask over the area.  Notes a similar episode a number of years ago when seeing Dr. Toney Rakes.  Does have a history of HSV on the lateral thigh but not in this area traditionally.  Past medical history,surgical history, problem list, medications, allergies, family history and social history were all reviewed and documented in the EPIC chart.  Directed ROS with pertinent positives and negatives documented in the history of present illness/assessment and plan.  Exam: Caryn Bee assistant Vitals:   06/16/19 1608  BP: 118/76   General appearance:  Normal Abdomen soft nontender without masses guarding rebound Pelvic external BUS vagina with atrophic changes.  2 areas of ulceration as diagrammed.  HSV PCR done over both areas.  Bimanual without masses or tenderness.  Physical Exam  Genitourinary:        Assessment/Plan:  73 y.o. [id number] with vulvar ulceration suspicious for HSV.  We will continue with sits baths and Desenex coating afterwards.  Discussed possibilities to include HSV.  Discussed possible Valtrex for recurrent lesions but being its over a week now do not feel Valtrex will be of much value over symptomatic treatment.  I asked the patient to follow-up in a week or 2 if these areas have not completely resolved.   Anastasio Auerbach MD, 4:28 PM 06/16/2019

## 2019-06-19 LAB — SURESWAB HSV, TYPE 1/2 DNA, PCR
HSV 1 DNA: NOT DETECTED
HSV 2 DNA: DETECTED — AB

## 2019-06-20 ENCOUNTER — Encounter: Payer: Self-pay | Admitting: Gynecology

## 2019-07-08 ENCOUNTER — Other Ambulatory Visit: Payer: Self-pay

## 2019-07-08 ENCOUNTER — Encounter: Payer: Self-pay | Admitting: Gynecology

## 2019-07-08 ENCOUNTER — Ambulatory Visit (INDEPENDENT_AMBULATORY_CARE_PROVIDER_SITE_OTHER): Payer: Medicare Other | Admitting: Gynecology

## 2019-07-08 VITALS — BP 130/70

## 2019-07-08 DIAGNOSIS — A609 Anogenital herpesviral infection, unspecified: Secondary | ICD-10-CM | POA: Diagnosis not present

## 2019-07-08 MED ORDER — VALACYCLOVIR HCL 500 MG PO TABS: 500.0000 mg | ORAL_TABLET | Freq: Two times a day (BID) | ORAL | 2 refills | Status: DC

## 2019-07-08 NOTE — Progress Notes (Signed)
    Kristen Murray [birth year] [id number]        73 y.o.  G3P3003 presents with 1 week history of upper inner labia minora sores.  Recently evaluated 06/16/2019 with lower labial HSV-2 PCR positive.  Past medical history,surgical history, problem list, medications, allergies, family history and social history were all reviewed and documented in the EPIC chart.  Directed ROS with pertinent positives and negatives documented in the history of present illness/assessment and plan.  Exam: Copywriter, advertising Vitals:   07/08/19 1211  BP: 130/70   General appearance:  Normal Abdomen soft nontender without masses guarding rebound Pelvic external BUS vagina with atrophic changes.  2 classic punctated ulcers upper inner labia minora consistent with HSV.  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  Will cover with Valtrex 500 mg twice daily x7 days then daily for 6 months to suppress outbreaks.  She will represent if she has any further outbreaks.    Anastasio Auerbach MD, 12:24 PM 07/08/2019

## 2019-07-08 NOTE — Patient Instructions (Signed)
Take the Valtrex medication twice daily for 1 week and then once daily for 6 months to see if we cannot suppress the herpes outbreaks.

## 2019-07-17 ENCOUNTER — Encounter: Payer: Self-pay | Admitting: Family

## 2019-07-17 ENCOUNTER — Telehealth: Payer: Self-pay | Admitting: Hematology & Oncology

## 2019-07-17 ENCOUNTER — Inpatient Hospital Stay: Payer: Medicare Other

## 2019-07-17 ENCOUNTER — Other Ambulatory Visit: Payer: Self-pay

## 2019-07-17 ENCOUNTER — Inpatient Hospital Stay: Payer: Medicare Other | Attending: Hematology & Oncology | Admitting: Family

## 2019-07-17 VITALS — BP 129/65 | HR 104 | Temp 97.0°F | Resp 19 | Wt 175.0 lb

## 2019-07-17 DIAGNOSIS — D708 Other neutropenia: Secondary | ICD-10-CM

## 2019-07-17 DIAGNOSIS — D72819 Decreased white blood cell count, unspecified: Secondary | ICD-10-CM | POA: Insufficient documentation

## 2019-07-17 DIAGNOSIS — D649 Anemia, unspecified: Secondary | ICD-10-CM | POA: Insufficient documentation

## 2019-07-17 LAB — CMP (CANCER CENTER ONLY)
ALT: 11 U/L (ref 0–44)
AST: 14 U/L — ABNORMAL LOW (ref 15–41)
Albumin: 3.9 g/dL (ref 3.5–5.0)
Alkaline Phosphatase: 76 U/L (ref 38–126)
Anion gap: 7 (ref 5–15)
BUN: 10 mg/dL (ref 8–23)
CO2: 30 mmol/L (ref 22–32)
Calcium: 9.7 mg/dL (ref 8.9–10.3)
Chloride: 100 mmol/L (ref 98–111)
Creatinine: 0.8 mg/dL (ref 0.44–1.00)
GFR, Est AFR Am: >60 mL/min (ref >60)
GFR, Estimated: >60 mL/min (ref >60)
Glucose, Bld: 245 mg/dL — ABNORMAL HIGH (ref 70–99)
Potassium: 3.9 mmol/L (ref 3.5–5.1)
Sodium: 137 mmol/L (ref 135–145)
Total Bilirubin: 0.3 mg/dL (ref 0.3–1.2)
Total Protein: 7.2 g/dL (ref 6.5–8.1)

## 2019-07-17 LAB — CBC WITH DIFFERENTIAL (CANCER CENTER ONLY)
Abs Immature Granulocytes: 0.01 10*3/uL (ref 0.00–0.07)
Basophils Absolute: 0 10*3/uL (ref 0.0–0.1)
Basophils Relative: 0 %
Eosinophils Absolute: 0 10*3/uL (ref 0.0–0.5)
Eosinophils Relative: 1 %
HCT: 35.2 % — ABNORMAL LOW (ref 36.0–46.0)
Hemoglobin: 10.9 g/dL — ABNORMAL LOW (ref 12.0–15.0)
Immature Granulocytes: 0 %
Lymphocytes Relative: 57 %
Lymphs Abs: 1.4 10*3/uL (ref 0.7–4.0)
MCH: 25.6 pg — ABNORMAL LOW (ref 26.0–34.0)
MCHC: 31 g/dL (ref 30.0–36.0)
MCV: 82.8 fL (ref 80.0–100.0)
Monocytes Absolute: 0.2 10*3/uL (ref 0.1–1.0)
Monocytes Relative: 9 %
Neutro Abs: 0.8 10*3/uL — ABNORMAL LOW (ref 1.7–7.7)
Neutrophils Relative %: 33 %
Platelet Count: 209 10*3/uL (ref 150–400)
RBC: 4.25 MIL/uL (ref 3.87–5.11)
RDW: 18.4 % — ABNORMAL HIGH (ref 11.5–15.5)
WBC Count: 2.5 10*3/uL — ABNORMAL LOW (ref 4.0–10.5)
nRBC: 0 % (ref 0.0–0.2)

## 2019-07-17 LAB — RETICULOCYTES
Immature Retic Fract: 13.7 % (ref 2.3–15.9)
RBC.: 4.23 MIL/uL (ref 3.87–5.11)
Retic Count, Absolute: 23.7 10*3/uL (ref 19.0–186.0)
Retic Ct Pct: 0.6 % (ref 0.4–3.1)

## 2019-07-17 LAB — FERRITIN: Ferritin: 123 ng/mL (ref 11–307)

## 2019-07-17 LAB — IRON AND TIBC
Iron: 34 ug/dL — ABNORMAL LOW (ref 41–142)
Saturation Ratios: 14 % — ABNORMAL LOW (ref 21–57)
TIBC: 245 ug/dL (ref 236–444)
UIBC: 211 ug/dL (ref 120–384)

## 2019-07-17 LAB — SAVE SMEAR(SSMR), FOR PROVIDER SLIDE REVIEW

## 2019-07-17 NOTE — Telephone Encounter (Signed)
Called and LM on VM for patient with date/time of next appointments per 9/17 los
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
## 2019-07-18 ENCOUNTER — Other Ambulatory Visit: Payer: Self-pay | Admitting: Family

## 2019-07-18 ENCOUNTER — Telehealth: Payer: Self-pay | Admitting: *Deleted

## 2019-07-18 DIAGNOSIS — D500 Iron deficiency anemia secondary to blood loss (chronic): Secondary | ICD-10-CM | POA: Insufficient documentation

## 2019-07-18 DIAGNOSIS — D509 Iron deficiency anemia, unspecified: Secondary | ICD-10-CM | POA: Insufficient documentation

## 2019-07-18 DIAGNOSIS — D508 Other iron deficiency anemias: Secondary | ICD-10-CM

## 2019-07-18 NOTE — Telephone Encounter (Signed)
Message received this morning from patient wanting to know if Judson Roch is concerned about her having cancer.  Call placed back to patient and patient notified per order of S. [place] NP that there is "No cancer, just iron deficiency and mylodysplasia where in her case the production of her WBC is down. No changes! We just keep following with her :)"  Pt appreciative of call back and has no further questions or concerns at this time.

## 2019-07-21 ENCOUNTER — Inpatient Hospital Stay: Payer: Medicare Other

## 2019-07-21 ENCOUNTER — Other Ambulatory Visit: Payer: Self-pay

## 2019-07-21 VITALS — BP 122/63 | HR 83 | Temp 97.5°F | Resp 17

## 2019-07-21 DIAGNOSIS — D508 Other iron deficiency anemias: Secondary | ICD-10-CM

## 2019-07-21 DIAGNOSIS — D72819 Decreased white blood cell count, unspecified: Secondary | ICD-10-CM | POA: Diagnosis not present

## 2019-07-21 MED ORDER — HEPARIN SOD (PORK) LOCK FLUSH 100 UNIT/ML IV SOLN
500.0000 [IU] | Freq: Once | INTRAVENOUS | Status: DC | PRN
  Filled 2019-07-21: qty 5

## 2019-07-21 MED ORDER — HEPARIN SOD (PORK) LOCK FLUSH 100 UNIT/ML IV SOLN
250.0000 [IU] | Freq: Once | INTRAVENOUS | Status: DC | PRN
  Filled 2019-07-21: qty 5

## 2019-07-21 MED ORDER — SODIUM CHLORIDE 0.9% FLUSH
10.0000 mL | Freq: Once | INTRAVENOUS | Status: DC | PRN
  Filled 2019-07-21: qty 10

## 2019-07-21 MED ORDER — SODIUM CHLORIDE 0.9 % IV SOLN
510.0000 mg | Freq: Once | INTRAVENOUS | Status: AC
  Administered 2019-07-21: 510 mg via INTRAVENOUS
  Filled 2019-07-21: qty 510

## 2019-07-21 MED ORDER — SODIUM CHLORIDE 0.9 % IV SOLN
Freq: Once | INTRAVENOUS | Status: AC
  Administered 2019-07-21: 13:00:00 via INTRAVENOUS
  Filled 2019-07-21: qty 250

## 2019-07-21 MED ORDER — SODIUM CHLORIDE 0.9% FLUSH
3.0000 mL | Freq: Once | INTRAVENOUS | Status: DC | PRN
  Filled 2019-07-21: qty 10

## 2019-07-21 MED ORDER — ALTEPLASE 2 MG IJ SOLR
2.0000 mg | Freq: Once | INTRAMUSCULAR | Status: DC | PRN
  Filled 2019-07-21: qty 2

## 2019-07-21 NOTE — Patient Instructions (Signed)

## 2019-07-22 DIAGNOSIS — M19079 Primary osteoarthritis, unspecified ankle and foot: Secondary | ICD-10-CM | POA: Insufficient documentation

## 2019-08-01 ENCOUNTER — Telehealth: Payer: Self-pay | Admitting: *Deleted

## 2019-08-01 NOTE — Telephone Encounter (Signed)
Message received from patient stating that she is having constipation after iron infusion and would like to know what she should take for constipation.  Kristen Ee NP notified.  Call placed back to patient and patient notified per order of S. [place] NP that iron infusion is not the cause of her constipation and to contact her PCP regarding constipation.  Pt appreciative of call back and has no further questions or concerns at this time.

## 2019-08-04 ENCOUNTER — Other Ambulatory Visit: Payer: Self-pay

## 2019-08-04 ENCOUNTER — Emergency Department (HOSPITAL_BASED_OUTPATIENT_CLINIC_OR_DEPARTMENT_OTHER)
Admission: EM | Admit: 2019-08-04 | Discharge: 2019-08-04 | Disposition: A | Discharge status: Home / Self Care | Payer: Medicare Other | Attending: Emergency Medicine | Admitting: Emergency Medicine

## 2019-08-04 ENCOUNTER — Encounter (HOSPITAL_BASED_OUTPATIENT_CLINIC_OR_DEPARTMENT_OTHER): Payer: Self-pay

## 2019-08-04 ENCOUNTER — Emergency Department (HOSPITAL_BASED_OUTPATIENT_CLINIC_OR_DEPARTMENT_OTHER): Payer: Medicare Other

## 2019-08-04 DIAGNOSIS — W01198A Fall on same level from slipping, tripping and stumbling with subsequent striking against other object, initial encounter: Secondary | ICD-10-CM | POA: Insufficient documentation

## 2019-08-04 DIAGNOSIS — Y9301 Activity, walking, marching and hiking: Secondary | ICD-10-CM | POA: Insufficient documentation

## 2019-08-04 DIAGNOSIS — Z91040 Latex allergy status: Secondary | ICD-10-CM | POA: Diagnosis not present

## 2019-08-04 DIAGNOSIS — Y929 Unspecified place or not applicable: Secondary | ICD-10-CM | POA: Insufficient documentation

## 2019-08-04 DIAGNOSIS — I10 Essential (primary) hypertension: Secondary | ICD-10-CM | POA: Diagnosis not present

## 2019-08-04 DIAGNOSIS — Y998 Other external cause status: Secondary | ICD-10-CM | POA: Diagnosis not present

## 2019-08-04 DIAGNOSIS — Z7984 Long term (current) use of oral hypoglycemic drugs: Secondary | ICD-10-CM | POA: Diagnosis not present

## 2019-08-04 DIAGNOSIS — E119 Type 2 diabetes mellitus without complications: Secondary | ICD-10-CM | POA: Diagnosis not present

## 2019-08-04 DIAGNOSIS — S0083XA Contusion of other part of head, initial encounter: Secondary | ICD-10-CM | POA: Diagnosis not present

## 2019-08-04 DIAGNOSIS — Z79899 Other long term (current) drug therapy: Secondary | ICD-10-CM | POA: Insufficient documentation

## 2019-08-04 DIAGNOSIS — T148XXA Other injury of unspecified body region, initial encounter: Secondary | ICD-10-CM

## 2019-08-04 DIAGNOSIS — S0990XA Unspecified injury of head, initial encounter: Secondary | ICD-10-CM

## 2019-08-04 DIAGNOSIS — W19XXXA Unspecified fall, initial encounter: Secondary | ICD-10-CM

## 2019-08-04 NOTE — Discharge Instructions (Addendum)
CT images showed no fracture today.  Please monitor wound for any signs of infection.  Follow up with your primary care physician in 2-3 days for reevaluation.

## 2019-08-04 NOTE — ED Notes (Signed)
ED Provider at bedside. 

## 2019-08-04 NOTE — ED Provider Notes (Signed)
[facility] EMERGENCY DEPARTMENT Provider Note   CSN: [id number] Arrival date & time: 08/04/19  1738     History   Chief Complaint Chief Complaint  Patient presents with   Fall    HPI Kristen Murray is a 73 y.o. female who tripped and fell prior to arrival and hit her head on the concrete.  Patient states that her forehead hurts it is mildly painful, dull, achy, persistent and associated with mild headache with photosensitivity.  She states that she has a small cut over her left eye and that it is tender to touch over the eyebrow.   Patient denies loss of consciousness and is not on blood thinners, denies any retrograde amnesia, is able to explain the events surrounding her fall.  Patient states she has no weakness or sensation changes, denies was able to drive herself to the emergency department.  Patient states her primary concern is that she is worried that she broke a bone in her face.      HPI  Past Medical History:  Diagnosis Date   Borderline abnormal TFTs    as a teen   Diabetes mellitus without complication ([place])    E. 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   Vaginal atrophy 10/16/2012   Menopause 10/16/2012   Chest pain, atypical 02/19/2012   Type 2 diabetes mellitus with neurological manifestations, 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/2012   COSTOCHONDRITIS 11/16/2010    DISTURBANCES OF SENSATION OF SMELL AND TASTE 04/05/2010   NEPHROLITHIASIS, HX OF 02/08/2010   EDEMA 10/01/2009   Brachial neuritis or radiculitis NOS 06/14/2009   EXTERNAL HEMORRHOIDS 05/25/2009   MUSCLE PAIN 05/17/2009   HYPERLIPIDEMIA 04/23/2009   ABNORMAL THYROID FUNCTION TESTS 04/23/2009   MENINGIOMA 12/26/2007   HEADACHE 12/26/2007   RADICULOPATHY 04/02/2007   HYPERTENSION 11/21/2006   GERD 11/21/2006    Past Surgical History:  Procedure Laterality Date   ABDOMINAL HYSTERECTOMY     TAH/BSO --fibroids, no cancer   BALLOON DILATION N/A 07/20/2014   Procedure: BALLOON DILATION;  Surgeon: Garlan Fair, MD;  Location: [facility];  Service: Endoscopy;  Laterality: N/A;   BREAST BIOPSY     Right   CARPAL TUNNEL RELEASE     & trigger thumb RUE; Dr Alphonzo Cruise   COLONOSCOPY W/ POLYPECTOMY  07/2004   Neg 2011; Dr Earle Gell   CYSTOSCOPY  11/2009   Neg   ESOPHAGOGASTRODUODENOSCOPY N/A 07/20/2014   Procedure: ESOPHAGOGASTRODUODENOSCOPY (EGD);  Surgeon: Garlan Fair, MD;  Location: [facility];  Service: Endoscopy;  Laterality: N/A;   FLEXIBLE BRONCHOSCOPY W/ UPPER ENDOSCOPY     neg   KNEE ARTHROSCOPY     Left   ROTATOR CUFF REPAIR     R shoulder   TONSILLECTOMY     TRIGGER FINGER RELEASE Right  Right Thumb     OB History    Gravida  3   Para  3   Term  3   Preterm      AB      Living  3     SAB      TAB      Ectopic      Multiple      Live Births  3            Home Medications    Prior to Admission medications   Medication Sig Start Date End Date Taking? Authorizing Provider  cefUROXime (CEFTIN) 250 MG tablet cefuroxime axetil 250 mg tablet    [provider]  doxycycline (MONODOX) 100 MG capsule  03/26/18   [provider]  erythromycin ophthalmic ointment erythromycin 5 mg/gram (0.5 %) eye ointment 03/07/18   [provider]  fluorometholone (FML) 0.1 % ophthalmic suspension Place 1  drop into both eyes 3 (three) times daily.    [provider]  fluticasone (FLONASE) 50 MCG/ACT nasal spray Place 1 spray into both nostrils 2 (two) times daily. 04/01/19   Kozlow, Donnamarie Poag, MD  glucose blood (FREESTYLE LITE) test strip CHECK BLOOD SUGAR ONCE DAILY AS DIRECTED.  DX:790.29 01/09/14   Hendricks Limes, MD  hydrocortisone-pramoxine [facility]) rectal foam Place 1 applicator rectally 2 (two) times daily. 06/16/19   Fontaine, Belinda Block, MD  ipratropium (ATROVENT) 0.06 % nasal spray Place 2 sprays into both nostrils 3 (three) times daily. 04/01/19   Kozlow, Donnamarie Poag, MD  Lancets (FREESTYLE) lancets Check blood sugar once daily as directed DX:790.29 (FREESTYLE FREEDOM LITE) 01/09/14   Hendricks Limes, MD  montelukast (SINGULAIR) 10 MG tablet Take 1 tablet (10 mg total) by mouth at bedtime. 04/01/19   Kozlow, Donnamarie Poag, MD  Multiple Vitamin (MULTIVITAMIN) tablet Take 1 tablet by mouth daily.      [provider]  NONFORMULARY OR COMPOUNDED ITEM Estradiol .02% 1 ML Prefilled Applicator Sig: apply vaginally twice a week #90 Day Supply with 4 refills 11/08/16   Terrance Mass, MD  SITagliptin Phosphate (JANUVIA PO) Take by mouth.    [provider]  trimethoprim (TRIMPEX) 100 MG tablet Take 100 mg by mouth daily.    [provider]  valACYclovir (VALTREX) 500 MG tablet Take 1 tablet (500 mg total) by mouth 2 (two) times daily. For one week then one pill daily for 6 months 07/08/19   Fontaine, Belinda Block, MD  verapamil (CALAN-SR) 180 MG CR tablet  01/02/17   [provider]    Family History Family History  Problem Relation Age of Onset   Alcohol abuse Mother    Diabetes Maternal Aunt    Diabetes Maternal Uncle    Diabetes Maternal Grandmother    Tuberculosis Maternal Uncle    Other Father        unknown   Coronary artery disease Neg Hx     Social History Social History   Tobacco Use   Smoking status: Never Smoker   Smokeless tobacco:  Never Used  Substance Use Topics   Alcohol use: No    Alcohol/week: 0.0 standard drinks   Drug use: No     Allergies   Codeine, Propoxyphene, Propoxyphene hcl, Latex, Macrodantin [nitrofurantoin macrocrystal], Other, Ciprofloxacin, and Tramadol   Review of Systems Review of Systems  All other systems reviewed and are negative.    Physical Exam Updated Vital Signs BP (!) 145/70 (BP Location: Right Arm)  Pulse 73    Temp 98.1 F (36.7 C) (Oral)    Resp 16    Ht 5\' 5"  (1.651 m)    Wt 76.7 kg    LMP 07/30/1990    SpO2 99%    BMI 28.12 kg/m   Physical Exam Vitals signs and nursing note reviewed.  Constitutional:      General: She is not in acute distress. HENT:     Head: Normocephalic.     Comments: She has contusion over her eyebrow on the left side.  Small 1 cm laceration that is shallow and non-gaping and linear over her left superior eyelid.    Nose: Nose normal.     Mouth/Throat:     Mouth: Mucous membranes are moist.  Eyes:     General: No scleral icterus. Neck:     Musculoskeletal: Normal range of motion.     Comments: Some mild tenderness over midline Cardiovascular:     Rate and Rhythm: Normal rate and regular rhythm.     Pulses: Normal pulses.     Heart sounds: Normal heart sounds.  Pulmonary:     Effort: Pulmonary effort is normal. No respiratory distress.     Breath sounds: No wheezing.  Abdominal:     Palpations: Abdomen is soft.     Tenderness: There is no abdominal tenderness.  Musculoskeletal:     Right lower leg: No edema.     Left lower leg: No edema.  Skin:    General: Skin is warm and dry.     Capillary Refill: Capillary refill takes less than 2 seconds.  Neurological:     Mental Status: She is alert. Mental status is at baseline.     Comments: Alert and oriented to self, place, time and event.  Speech is fluent, clear without dysarthria or dysphasia.  Strength 5/5 in upper/lower extremities  Sensation intact in upper/lower extremities    Normal gait.  Normal finger-to-nose and feet tapping.  CN I not tested  CN II grossly intact visual fields bilaterally. Did not visualize posterior eye.   CN III, IV, VI PERRLA and EOMs intact bilaterally  CN V Intact sensation to sharp and light touch to the face  CN VII facial movements symmetric  CN VIII not tested  CN IX, X no uvula deviation, symmetric rise of soft palate  CN XI 5/5 SCM and trapezius strength bilaterally  CN XII Midline tongue protrusion, symmetric L/R movements   Psychiatric:        Mood and Affect: Mood normal.        Behavior: Behavior normal.      ED Treatments / Results  Labs (all labs ordered are listed, but only abnormal results are displayed) Labs Reviewed - No data to display  EKG None  Radiology Ct Head Wo Contrast  Result Date: 08/04/2019 CLINICAL DATA:  Tripped and fell.  Facial trauma. EXAM: CT HEAD WITHOUT CONTRAST CT MAXILLOFACIAL WITHOUT CONTRAST CT CERVICAL SPINE WITHOUT CONTRAST TECHNIQUE: Multidetector CT imaging of the head, cervical spine, and maxillofacial structures were performed using the standard protocol without intravenous contrast. Multiplanar CT image reconstructions of the cervical spine and maxillofacial structures were also generated. COMPARISON:  None. FINDINGS: CT HEAD FINDINGS Brain: Mild age related cerebral atrophy and ventriculomegaly. The ventricles are in the midline without mass effect or shift and are normal in configuration. No extra-axial fluid collections are identified. No acute intracranial findings such as hemispheric infarction or intracranial hemorrhage. No mass lesions. Incidental calcified meningioma noted in  the right occipital area. Vascular: Minimal scattered vascular calcifications. No hyperdense vessels or aneurysm. Skull: No skull fracture or bone lesions. Other: The paranasal sinuses and mastoid air cells are clear. The globes are intact. CT MAXILLOFACIAL FINDINGS Osseous: No acute facial bone fractures.  Orbits: Negative. No traumatic or inflammatory finding. Sinuses: Clear. Soft tissues: Negative. CT CERVICAL SPINE FINDINGS Alignment: Normal Skull base and vertebrae: Moderate degenerative cervical spondylosis with multilevel disc disease and facet disease but no acute cervical spine fractures identified. Soft tissues and spinal canal: No prevertebral fluid or swelling. No visible canal hematoma. Disc levels: The spinal canal is fairly generous. No significant spinal stenosis. Mild multilevel foraminal narrowing due to uncinate spurring and facet disease. Upper chest: The visualized lung apices are grossly clear. Other: No neck mass or adenopathy. 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 Small nodules noted in the right lobe. IMPRESSION: 1. No acute intracranial findings or skull fracture. 2. No acute facial bone fractures. 3. 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 Electronically Signed   By: Marijo Sanes M.D.   On: 08/04/2019 19:21   Ct Cervical Spine Wo Contrast  Result Date: 08/04/2019 CLINICAL DATA:  Tripped and fell.  Facial trauma. EXAM: CT HEAD WITHOUT CONTRAST CT MAXILLOFACIAL WITHOUT CONTRAST CT CERVICAL SPINE WITHOUT CONTRAST TECHNIQUE: Multidetector CT imaging of the head, cervical spine, and maxillofacial structures were performed using the standard protocol without intravenous contrast. Multiplanar CT image reconstructions of the cervical spine and maxillofacial structures were also generated. COMPARISON:  None. FINDINGS: CT HEAD FINDINGS Brain: Mild age related cerebral atrophy and ventriculomegaly. The ventricles are in the midline without mass effect or shift and are normal in configuration. No extra-axial fluid collections are identified. No acute intracranial findings such as hemispheric infarction or intracranial hemorrhage. No mass lesions. Incidental calcified meningioma noted in the right  occipital area. Vascular: Minimal scattered vascular calcifications. No hyperdense vessels or aneurysm. Skull: No skull fracture or bone lesions. Other: The paranasal sinuses and mastoid air cells are clear. The globes are intact. CT MAXILLOFACIAL FINDINGS Osseous: No acute facial bone fractures. Orbits: Negative. No traumatic or inflammatory finding. Sinuses: Clear. Soft tissues: Negative. CT CERVICAL SPINE FINDINGS Alignment: Normal Skull base and vertebrae: Moderate degenerative cervical spondylosis with multilevel disc disease and facet disease but no acute cervical spine fractures identified. Soft tissues and spinal canal: No prevertebral fluid or swelling. No visible canal hematoma. Disc levels: The spinal canal is fairly generous. No significant spinal stenosis. Mild multilevel foraminal narrowing due to uncinate spurring and facet disease. Upper chest: The visualized lung apices are grossly clear. Other: No neck mass or adenopathy. 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 Small nodules noted in the right lobe. IMPRESSION: 1. No acute intracranial findings or skull fracture. 2. No acute facial bone fractures. 3. 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 Electronically Signed   By: Marijo Sanes M.D.   On: 08/04/2019 19:21   Ct Maxillofacial Wo Cm  Result Date: 08/04/2019 CLINICAL DATA:  Tripped and fell.  Facial trauma. EXAM: CT HEAD WITHOUT CONTRAST CT MAXILLOFACIAL WITHOUT CONTRAST CT CERVICAL SPINE WITHOUT CONTRAST TECHNIQUE: Multidetector CT imaging of the head, cervical spine, and maxillofacial structures were performed using the standard protocol without intravenous contrast. Multiplanar CT image reconstructions of the cervical spine and maxillofacial structures were also generated. COMPARISON:  None. FINDINGS: CT HEAD FINDINGS  Brain: Mild age related cerebral atrophy and ventriculomegaly. The ventricles are  in the midline without mass effect or shift and are normal in configuration. No extra-axial fluid collections are identified. No acute intracranial findings such as hemispheric infarction or intracranial hemorrhage. No mass lesions. Incidental calcified meningioma noted in the right occipital area. Vascular: Minimal scattered vascular calcifications. No hyperdense vessels or aneurysm. Skull: No skull fracture or bone lesions. Other: The paranasal sinuses and mastoid air cells are clear. The globes are intact. CT MAXILLOFACIAL FINDINGS Osseous: No acute facial bone fractures. Orbits: Negative. No traumatic or inflammatory finding. Sinuses: Clear. Soft tissues: Negative. CT CERVICAL SPINE FINDINGS Alignment: Normal Skull base and vertebrae: Moderate degenerative cervical spondylosis with multilevel disc disease and facet disease but no acute cervical spine fractures identified. Soft tissues and spinal canal: No prevertebral fluid or swelling. No visible canal hematoma. Disc levels: The spinal canal is fairly generous. No significant spinal stenosis. Mild multilevel foraminal narrowing due to uncinate spurring and facet disease. Upper chest: The visualized lung apices are grossly clear. Other: No neck mass or adenopathy. 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 Small nodules noted in the right lobe. IMPRESSION: 1. No acute intracranial findings or skull fracture. 2. No acute facial bone fractures. 3. 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 Electronically Signed   By: Marijo Sanes M.D.   On: 08/04/2019 19:21    Procedures Procedures (including critical care time)  Medications Ordered in ED Medications - No data to display   Initial Impression / Assessment and Plan / ED Course  I have reviewed the triage vital signs and the nursing notes.  Pertinent labs & imaging results that were available during my care of the  patient were reviewed by me and considered in my medical decision making (see chart for details).    Patient is 73 year old female who fell and hit her head prior to arrival to ED.  Has normal neuro exam mechanism age makes her concern for cervical spine or facial fracture. CT of head, facial bones, cervical spine without acute fracture.  Small laceration on eyelid approximated with Steri-Strips.   Patient given results of CT scans feels well to go home at this time.  Vitals within normal limits other than borderline elevated BP. Patient states she is agreeable to use tylenol/ibuprofen for pain.         The patient appears reasonably screened and/or stabilized for discharge and I doubt any other medical condition or other [facility] requiring further screening, evaluation, or treatment in the ED at this time prior to discharge.  Patient is hemodynamically stable, in NAD, and able to ambulate in the ED. Pain has been managed or a plan has been made for home management and has no complaints prior to discharge. Patient is comfortable with above plan and is stable for discharge at this time. All questions were answered prior to disposition. Results from the ER workup discussed with the patient face to face and all questions answered to the best of my ability. The patient is safe for discharge with strict return precautions. Patient appears safe for discharge with appropriate follow-up.  Conveyed my impression with the patient and he voiced understanding and is agreeable to plan.   An After Visit Summary was printed and given to the patient.  Portions of this note were generated with [employer]. Dictation errors may occur despite best attempts at proofreading.  Final Clinical Impressions(s) / ED Diagnoses   Final diagnoses:  Fall, initial encounter  Contusion of face, initial encounter  Abrasion  Injury of head, initial encounter    ED Discharge Orders    None       Tedd Sias, [place] 08/05/19 0128    Lennice Sites, DO 08/05/19 1714

## 2019-08-04 NOTE — ED Notes (Signed)
Patient transported to CT 

## 2019-08-04 NOTE — ED Triage Notes (Signed)
Pt was playing in the driveway and tripped over a ball. Pt has a lac over L eye. Pt denies LOC. Pt denies being on blood thinners.

## 2019-08-04 NOTE — ED Notes (Signed)
Pt denies neck pain, and denies back pain, denies loc. Reports mild HA

## 2019-08-07 ENCOUNTER — Encounter: Payer: Self-pay | Admitting: Gynecology

## 2019-09-30 ENCOUNTER — Encounter: Payer: Self-pay | Admitting: Allergy and Immunology

## 2019-09-30 ENCOUNTER — Other Ambulatory Visit: Payer: Self-pay

## 2019-09-30 ENCOUNTER — Ambulatory Visit (INDEPENDENT_AMBULATORY_CARE_PROVIDER_SITE_OTHER): Payer: Medicare Other | Admitting: Allergy and Immunology

## 2019-09-30 VITALS — BP 120/68 | HR 84 | Temp 97.7°F | Resp 18

## 2019-09-30 DIAGNOSIS — K219 Gastro-esophageal reflux disease without esophagitis: Secondary | ICD-10-CM

## 2019-09-30 DIAGNOSIS — J3089 Other allergic rhinitis: Secondary | ICD-10-CM | POA: Diagnosis not present

## 2019-09-30 DIAGNOSIS — T7805XD Anaphylactic reaction due to tree nuts and seeds, subsequent encounter: Secondary | ICD-10-CM | POA: Diagnosis not present

## 2019-09-30 MED ORDER — IPRATROPIUM BROMIDE 0.06 % NA SOLN: 1.0000 | Freq: Four times a day (QID) | NASAL | 5 refills | Status: DC | PRN

## 2019-09-30 MED ORDER — MONTELUKAST SODIUM 10 MG PO TABS: 10.0000 mg | ORAL_TABLET | Freq: Every day | ORAL | 5 refills | Status: DC

## 2019-09-30 NOTE — Progress Notes (Signed)
[place]   Follow-up Note  Referring Provider: [name], MD  Primary Provider: [name], MD Date of Office Visit: 09/30/2019  Subjective:   Kristen Murray (DOB: July 17, 1946) is a 73 y.o. female who returns to the [facility] on 09/30/2019 in re-evaluation of the following:  HPI: Kristen Murray returns to this clinic in reevaluation of allergic rhinitis and LPR and a history of food allergy.  Her last visit to this clinic was 01 April 2019.  Overall she believes that her respiratory tract issue is under pretty good control.  Every morning she does wake up and has this "morning clear out" with her nose and her throat and a slight cough but for the most part she has not really been bothered with her respiratory tract symptoms and has not required a systemic steroid or an antibiotic for any type of airway issue and while utilizing a combination of anti-inflammatory agents for her airway and therapy directed against reflux which basically includes elimination of caffeine and chocolate consumption she feels as though she is doing well.  She did receive the flu vaccine this year.  She has had 3 episodes of tachycardia and presently is wearing a 14-day heart monitor.  Concerning her food allergy, she remains away from eating tree nuts and pine nuts and banana and avocado.  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  She would like to be skin tested once again in further evaluation of her food allergies.  Allergies as of 09/30/2019      Reactions   Codeine    Hives Because of a history of documented adverse serious drug reaction;Medi Alert bracelet  is recommended   Propoxyphene Rash   Propoxyphene Hcl Swelling, Rash   rash   Latex    rash   Macrodantin [nitrofurantoin Macrocrystal] Other (See Comments)   Numbness, aching all over,    Other    Pine nuts & walnuts throat  congestion. No documented angioedema.   Tizanidine Other (See Comments)   Ciprofloxacin    ? Nausea (Note: she takes this when on mission trips)   Tramadol    ? nausea      Medication List    doxycycline 100 MG capsule Commonly known as: MONODOX   fluorometholone 0.1 % ophthalmic suspension Commonly known as: FML Place 1 drop into both eyes 3 (three) times daily.   fluticasone 50 MCG/ACT nasal spray Commonly known as: FLONASE Place 1 spray into both nostrils 2 (two) times daily.   freestyle lancets Check blood sugar once daily as directed DX:790.29 (FREESTYLE FREEDOM LITE)   glucose blood test strip Commonly known as: FREESTYLE LITE CHECK BLOOD SUGAR ONCE DAILY AS DIRECTED.  DX:790.29   ipratropium 0.06 % nasal spray Commonly known as: ATROVENT Place 1-2 sprays into both nostrils every 6 (six) hours as needed for rhinitis.   JANUVIA PO Take by mouth.   montelukast 10 MG tablet Commonly known as: SINGULAIR Take 1 tablet (10 mg total) by mouth at bedtime.   multivitamin tablet Take 1 tablet by mouth daily.   trimethoprim 100 MG tablet Commonly known as: TRIMPEX Take 100 mg by mouth daily.   valACYclovir 500 MG tablet Commonly known as: Valtrex Take 1 tablet (500 mg total) by mouth 2 (two) times daily. For one week then one pill daily for 6 months   verapamil 180 MG CR tablet  Commonly known as: CALAN-SR       Past Medical History:  Diagnosis Date   Borderline abnormal TFTs    as a teen   Diabetes mellitus without complication ([facility])    E. 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    HTN (hypertension)    Hyperglycemia    Hyperlipidemia    Meningioma (HCC)    Nephrolithiasis    Dr Amalia Hailey, [facility], Hx of [3][   Polyp of colon     Radiculopathy     Past Surgical History:  Procedure Laterality Date   ABDOMINAL HYSTERECTOMY     TAH/BSO --fibroids, no cancer   BALLOON DILATION N/A 07/20/2014   Procedure: BALLOON DILATION;  Surgeon: Garlan Fair, MD;  Location: [facility];  Service: Endoscopy;  Laterality: N/A;   BREAST BIOPSY     Right   CARPAL TUNNEL RELEASE     & trigger thumb RUE; Dr Alphonzo Cruise   COLONOSCOPY W/ POLYPECTOMY  07/2004   Neg 2011; Dr Earle Gell   CYSTOSCOPY  11/2009   Neg   ESOPHAGOGASTRODUODENOSCOPY N/A 07/20/2014   Procedure: ESOPHAGOGASTRODUODENOSCOPY (EGD);  Surgeon: Garlan Fair, MD;  Location: [facility];  Service: Endoscopy;  Laterality: N/A;   FLEXIBLE BRONCHOSCOPY W/ UPPER ENDOSCOPY     neg   KNEE ARTHROSCOPY     Left   ROTATOR CUFF REPAIR     R shoulder   TONSILLECTOMY     TRIGGER FINGER RELEASE Right    Right Thumb    Review of systems negative except as noted in HPI / PMHx or noted below:  Review of Systems  Constitutional: Negative.   HENT: Negative.   Eyes: Negative.   Respiratory: Negative.   Cardiovascular: Negative.   Gastrointestinal: Negative.   Genitourinary: Negative.   Musculoskeletal: Negative.   Skin: Negative.   Neurological: Negative.   Endo/Heme/Allergies: Negative.   Psychiatric/Behavioral: Negative.      Objective:   Vitals:   09/30/19 1053  BP: 120/68  Pulse: 84  Resp: 18  Temp: 97.7 F (36.5 C)  SpO2: 94%          Physical Exam Constitutional:      Appearance: She is not diaphoretic.  HENT:     Head: Normocephalic.     Right Ear: Tympanic membrane, ear canal and external ear normal.     Left Ear: Tympanic membrane, ear canal and external ear normal.     Nose: Nose normal. No mucosal edema or rhinorrhea.     Mouth/Throat:     Pharynx: Uvula midline. No oropharyngeal exudate.  Eyes:     Conjunctiva/sclera: Conjunctivae normal.  Neck:     Thyroid: No thyromegaly.     Trachea: Trachea normal. No tracheal  tenderness or tracheal deviation.  Cardiovascular:     Rate and Rhythm: Normal rate and regular rhythm.     Heart sounds: Normal heart sounds, S1 normal and S2 normal. No murmur.  Pulmonary:     Effort: No respiratory distress.     Breath sounds: Normal breath sounds. No stridor. No wheezing or rales.  Lymphadenopathy:     Head:     Right side of head: No tonsillar adenopathy.     Left side of head: No tonsillar adenopathy.  Cervical: No cervical adenopathy.  Skin:    Findings: No erythema or rash.     Nails: There is no clubbing.   Neurological:     Mental Status: She is alert.     Diagnostics: none  Assessment and Plan:   1. Other allergic rhinitis   2. LPRD (laryngopharyngeal reflux disease)   3. 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 Return for food skin testing (no antihistamines)  2. Continue to Treat and prevent inflammation:   A. Flonase 1-2 sprays each nostril one time per day  B. montelukast 10 mg tablet once a day  3. Continue to Treat LPR:   A. continue to consolidate all caffeine and chocolate use  B. can add OTC Tums if needed  C. Continue to replace throat clearing with swallowing maneuver  4. If needed:   A. nasal saline  B. OTC antihistamine  C. Epi-Pen  D. Can add OTC Mucinex two times per day  E.  Ipratropium 0.06% 1-2 sprays each nostril every 6 hours to dry nose  5. Obtain COVID vaccine when available  Kristen Murray appears to have pretty good control of her airway issue.  We will continue to have her use Flonase and montelukast on a regular basis and treat her LPR very conservatively by eliminating caffeine and chocolate and she has a selection of other agents to utilize should they be required.  We will work through her food allergy in more detail when it is logistically convenient for her to return to this clinic for skin testing.  Allena Katz, MD Allergy / Immunology [place]

## 2019-09-30 NOTE — Patient Instructions (Addendum)
  1. Return for food skin testing (no antihistamines)  2. Continue to Treat and prevent inflammation:   A. Flonase 1-2 sprays each nostril one time per day  B. montelukast 10 mg tablet once a day  3. Continue to Treat LPR:   A. continue to consolidate all caffeine and chocolate use  B. can add OTC Tums if needed  C. Continue to replace throat clearing with swallowing maneuver  4. If needed:   A. nasal saline  B. OTC antihistamine  C. Epi-Pen  D. Can add OTC Mucinex two times per day  E.  Ipratropium 0.06% 1-2 sprays each nostril every 6 hours to dry nose  5. Obtain COVID vaccine when available

## 2019-10-01 ENCOUNTER — Encounter: Payer: Self-pay | Admitting: Allergy and Immunology

## 2019-10-27 DIAGNOSIS — M47816 Spondylosis without myelopathy or radiculopathy, lumbar region: Secondary | ICD-10-CM | POA: Insufficient documentation

## 2019-11-04 ENCOUNTER — Ambulatory Visit: Payer: Medicare Other | Admitting: Allergy and Immunology

## 2019-11-13 ENCOUNTER — Other Ambulatory Visit: Payer: Self-pay

## 2019-11-13 ENCOUNTER — Encounter: Payer: Self-pay | Admitting: Hematology & Oncology

## 2019-11-13 ENCOUNTER — Telehealth: Payer: Self-pay | Admitting: Hematology & Oncology

## 2019-11-13 ENCOUNTER — Inpatient Hospital Stay: Payer: Medicare Other | Attending: Hematology & Oncology

## 2019-11-13 ENCOUNTER — Inpatient Hospital Stay (HOSPITAL_BASED_OUTPATIENT_CLINIC_OR_DEPARTMENT_OTHER): Payer: Medicare Other | Admitting: Hematology & Oncology

## 2019-11-13 VITALS — BP 150/66 | HR 105 | Temp 97.6°F | Resp 20 | Wt 170.1 lb

## 2019-11-13 DIAGNOSIS — D500 Iron deficiency anemia secondary to blood loss (chronic): Secondary | ICD-10-CM

## 2019-11-13 DIAGNOSIS — D72819 Decreased white blood cell count, unspecified: Secondary | ICD-10-CM

## 2019-11-13 DIAGNOSIS — D508 Other iron deficiency anemias: Secondary | ICD-10-CM | POA: Insufficient documentation

## 2019-11-13 DIAGNOSIS — K909 Intestinal malabsorption, unspecified: Secondary | ICD-10-CM | POA: Insufficient documentation

## 2019-11-13 LAB — CBC WITH DIFFERENTIAL (CANCER CENTER ONLY)
Abs Immature Granulocytes: 0.01 10*3/uL (ref 0.00–0.07)
Basophils Absolute: 0 10*3/uL (ref 0.0–0.1)
Basophils Relative: 0 %
Eosinophils Absolute: 0 10*3/uL (ref 0.0–0.5)
Eosinophils Relative: 1 %
HCT: 35.2 % — ABNORMAL LOW (ref 36.0–46.0)
Hemoglobin: 11.1 g/dL — ABNORMAL LOW (ref 12.0–15.0)
Immature Granulocytes: 0 %
Lymphocytes Relative: 55 %
Lymphs Abs: 1.4 10*3/uL (ref 0.7–4.0)
MCH: 27.4 pg (ref 26.0–34.0)
MCHC: 31.5 g/dL (ref 30.0–36.0)
MCV: 86.9 fL (ref 80.0–100.0)
Monocytes Absolute: 0.3 10*3/uL (ref 0.1–1.0)
Monocytes Relative: 12 %
Neutro Abs: 0.8 10*3/uL — ABNORMAL LOW (ref 1.7–7.7)
Neutrophils Relative %: 32 %
Platelet Count: 211 10*3/uL (ref 150–400)
RBC: 4.05 MIL/uL (ref 3.87–5.11)
RDW: 17.2 % — ABNORMAL HIGH (ref 11.5–15.5)
WBC Count: 2.5 10*3/uL — ABNORMAL LOW (ref 4.0–10.5)
nRBC: 0 % (ref 0.0–0.2)

## 2019-11-13 LAB — CMP (CANCER CENTER ONLY)
ALT: 12 U/L (ref 0–44)
AST: 14 U/L — ABNORMAL LOW (ref 15–41)
Albumin: 3.8 g/dL (ref 3.5–5.0)
Alkaline Phosphatase: 73 U/L (ref 38–126)
Anion gap: 7 (ref 5–15)
BUN: 14 mg/dL (ref 8–23)
CO2: 30 mmol/L (ref 22–32)
Calcium: 9.7 mg/dL (ref 8.9–10.3)
Chloride: 101 mmol/L (ref 98–111)
Creatinine: 0.73 mg/dL (ref 0.44–1.00)
GFR, Est AFR Am: >60 mL/min (ref >60)
GFR, Estimated: >60 mL/min (ref >60)
Glucose, Bld: 266 mg/dL — ABNORMAL HIGH (ref 70–99)
Potassium: 4 mmol/L (ref 3.5–5.1)
Sodium: 138 mmol/L (ref 135–145)
Total Bilirubin: 0.4 mg/dL (ref 0.3–1.2)
Total Protein: 6.9 g/dL (ref 6.5–8.1)

## 2019-11-13 LAB — SAVE SMEAR(SSMR), FOR PROVIDER SLIDE REVIEW

## 2019-11-13 NOTE — Progress Notes (Signed)
Hematology and Oncology Follow Up Visit  Kristen Murray [id number] 02-26-1946 74 y.o. 11/13/2019   Principle Diagnosis:   Leukopenia-progressive  Iron deficiency anemia-malabsorption  Current Therapy:    IV iron as needed-dose of Feraheme given on 07/21/2019      Interim History:  Kristen Murray is back for follow-up.  She is doing pretty well.  She had no problems over the holiday season.  1 issue is a daughter and grandchild are now living with her.  This is been a little bit of an adjustment.  She has had a fall.  She fell back in October.  She does not remember how she fell.  She had a CT of the brain and CT of the cervical spine.  Thankfully, there is no fractures.  She was little bit iron deficient when we saw her back in September.  Her ferritin was 123 with an iron saturation of 14%.  She has had no problems with fever.  There is been no cough or shortness of breath.  She has had no nausea or vomiting.  She has had no change in bowel or bladder habits.  There is been no leg swelling.  She has had no rashes.  Overall, her performance status is ECOG 1.  Medications:  Current Outpatient Medications:  .  cyclobenzaprine (FLEXERIL) 10 MG tablet, 10 mg daily as needed., Disp: , Rfl:  .  doxycycline (MONODOX) 100 MG capsule, 100 mg daily. , Disp: , Rfl:  .  fluorometholone (FML) 0.1 % ophthalmic suspension, Place 1 drop into both eyes 3 (three) times daily., Disp: , Rfl:  .  fluticasone (FLONASE) 50 MCG/ACT nasal spray, Place 1 spray into both nostrils 2 (two) times daily., Disp: 16 g, Rfl: 5 .  glucose blood (FREESTYLE LITE) test strip, CHECK BLOOD SUGAR ONCE DAILY AS DIRECTED.  DX:790.29, Disp: 100 each, Rfl: 12 .  ipratropium (ATROVENT) 0.06 % nasal spray, Place 1-2 sprays into both nostrils every 6 (six) hours as needed for rhinitis., Disp: 15 mL, Rfl: 5 .  Lancets (FREESTYLE) lancets, Check blood sugar once daily as directed DX:790.29 (FREESTYLE FREEDOM LITE), Disp: 100 each, Rfl:  1 .  montelukast (SINGULAIR) 10 MG tablet, Take 1 tablet (10 mg total) by mouth at bedtime., Disp: 30 tablet, Rfl: 5 .  Multiple Vitamin (MULTIVITAMIN) tablet, Take 1 tablet by mouth daily.  , Disp: , Rfl:  .  SITagliptin Phosphate (JANUVIA PO), Take by mouth., Disp: , Rfl:  .  trimethoprim (TRIMPEX) 100 MG tablet, Take 100 mg by mouth daily., Disp: , Rfl:  .  valACYclovir (VALTREX) 500 MG tablet, Take 1 tablet (500 mg total) by mouth 2 (two) times daily. For one week then one pill daily for 6 months, Disp: 90 tablet, Rfl: 2 .  verapamil (CALAN-SR) 180 MG CR tablet, 180 mg at bedtime. , Disp: , Rfl:   Allergies:  Allergies  Allergen Reactions  . Codeine     Hives Because of a history of documented adverse serious drug reaction;Medi Alert bracelet  is recommended  . Propoxyphene Rash  . Propoxyphene Hcl Swelling and Rash    rash  . Latex     rash  . Macrodantin [Nitrofurantoin Macrocrystal] Other (See Comments)    Numbness, aching all over,   . Other     Pine nuts & walnuts throat congestion. No documented angioedema.  . Tizanidine Other (See Comments)  . Ciprofloxacin     ? Nausea (Note: she takes this when on mission trips)  .  Tramadol     ? nausea    Past Medical History, Surgical history, Social history, and Family History were reviewed and updated.  Review of Systems: Review of Systems  Constitutional: Negative.   HENT:  Negative.   Eyes: Negative.   Respiratory: Negative.   Cardiovascular: Negative.   Gastrointestinal: Negative.   Endocrine: Negative.   Genitourinary: Negative.    Musculoskeletal: Negative.   Skin: Negative.   Neurological: Negative.   Hematological: Negative.   Psychiatric/Behavioral: Negative.     Physical Exam:  weight is 170 lb 1.3 oz (77.1 kg). Her temporal temperature is 97.6 F (36.4 C). Her blood pressure is 150/66 (abnormal) and her pulse is 105 (abnormal). Her respiration is 20 and oxygen saturation is 98%.   Wt Readings from Last 3  Encounters:  11/13/19 170 lb 1.3 oz (77.1 kg)  08/04/19 169 lb (76.7 kg)  07/17/19 175 lb (79.4 kg)    Physical Exam Vitals reviewed.  HENT:     Head: Normocephalic and atraumatic.  Eyes:     Pupils: Pupils are equal, round, and reactive to light.  Cardiovascular:     Rate and Rhythm: Normal rate and regular rhythm.     Heart sounds: Normal heart sounds.  Pulmonary:     Effort: Pulmonary effort is normal.     Breath sounds: Normal breath sounds.  Abdominal:     General: Bowel sounds are normal.     Palpations: Abdomen is soft.  Musculoskeletal:        General: No tenderness or deformity. Normal range of motion.     Cervical back: Normal range of motion.     Comments: There is moderate swelling of the left calf area.  This is somewhat tender to palpation.  There is no redness.  I cannot palpate any obvious venous cord.  There is no pain behind the left knee.  She has decent pulses in her distal extremities.  Lymphadenopathy:     Cervical: No cervical adenopathy.  Skin:    General: Skin is warm and dry.     Findings: No erythema or rash.  Neurological:     Mental Status: She is alert and oriented to person, place, and time.  Psychiatric:        Behavior: Behavior normal.        Thought Content: Thought content normal.        Judgment: Judgment normal.      Lab Results  Component Value Date   WBC 2.5 (L) 11/13/2019   HGB 11.1 (L) 11/13/2019   HCT 35.2 (L) 11/13/2019   MCV 86.9 11/13/2019   PLT 211 11/13/2019     Chemistry      Component Value Date/Time   NA 137 07/17/2019 0936   K 3.9 07/17/2019 0936   CL 100 07/17/2019 0936   CO2 30 07/17/2019 0936   BUN 10 07/17/2019 0936   CREATININE 0.80 07/17/2019 0936      Component Value Date/Time   CALCIUM 9.7 07/17/2019 0936   ALKPHOS 76 07/17/2019 0936   AST 14 (L) 07/17/2019 0936   ALT 11 07/17/2019 0936   BILITOT 0.3 07/17/2019 0936       Impression and Plan: Kristen Murray is a 74 year old African-American  female.  She has progressive leukopenia.  She is also anemic now.  Thankfully, her blood counts are holding relatively stable.  We will see what her iron studies look like.  I will see her back in 6 months now.  Everything seems  to be holding relatively stable so I think 6 months would be reasonable.   [name], MD 1/14/202110:01 AM

## 2019-11-13 NOTE — Telephone Encounter (Signed)
Appointments scheduled calendar printed per 1/14 los

## 2019-11-17 ENCOUNTER — Encounter: Payer: Medicare Other | Admitting: Gynecology

## 2019-11-21 ENCOUNTER — Telehealth: Payer: Self-pay | Admitting: *Deleted

## 2019-11-21 NOTE — Telephone Encounter (Signed)
Message received from patient wanting to know her iron study results.  Call placed back to patient and message left to notify pt that iron studies were not drawn at her last appt on 11/13/19 and that Dr. Marin Olp would like for her to come in to have them checked.  Message sent to scheduling.

## 2019-11-24 ENCOUNTER — Inpatient Hospital Stay: Payer: Medicare Other

## 2019-11-24 ENCOUNTER — Other Ambulatory Visit: Payer: Self-pay

## 2019-11-24 DIAGNOSIS — D72819 Decreased white blood cell count, unspecified: Secondary | ICD-10-CM | POA: Diagnosis not present

## 2019-11-24 DIAGNOSIS — D500 Iron deficiency anemia secondary to blood loss (chronic): Secondary | ICD-10-CM

## 2019-11-24 LAB — CMP (CANCER CENTER ONLY)
ALT: 14 U/L (ref 0–44)
AST: 14 U/L — ABNORMAL LOW (ref 15–41)
Albumin: 3.9 g/dL (ref 3.5–5.0)
Alkaline Phosphatase: 77 U/L (ref 38–126)
Anion gap: 7 (ref 5–15)
BUN: 14 mg/dL (ref 8–23)
CO2: 31 mmol/L (ref 22–32)
Calcium: 10.1 mg/dL (ref 8.9–10.3)
Chloride: 102 mmol/L (ref 98–111)
Creatinine: 0.74 mg/dL (ref 0.44–1.00)
GFR, Est AFR Am: >60 mL/min (ref >60)
GFR, Estimated: >60 mL/min (ref >60)
Glucose, Bld: 175 mg/dL — ABNORMAL HIGH (ref 70–99)
Potassium: 4.2 mmol/L (ref 3.5–5.1)
Sodium: 140 mmol/L (ref 135–145)
Total Bilirubin: 0.3 mg/dL (ref 0.3–1.2)
Total Protein: 7.6 g/dL (ref 6.5–8.1)

## 2019-11-24 LAB — CBC WITH DIFFERENTIAL (CANCER CENTER ONLY)
Abs Immature Granulocytes: 0 10*3/uL (ref 0.00–0.07)
Basophils Absolute: 0 10*3/uL (ref 0.0–0.1)
Basophils Relative: 0 %
Eosinophils Absolute: 0 10*3/uL (ref 0.0–0.5)
Eosinophils Relative: 0 %
HCT: 34.4 % — ABNORMAL LOW (ref 36.0–46.0)
Hemoglobin: 10.7 g/dL — ABNORMAL LOW (ref 12.0–15.0)
Immature Granulocytes: 0 %
Lymphocytes Relative: 71 %
Lymphs Abs: 1.9 10*3/uL (ref 0.7–4.0)
MCH: 26.8 pg (ref 26.0–34.0)
MCHC: 31.1 g/dL (ref 30.0–36.0)
MCV: 86.2 fL (ref 80.0–100.0)
Monocytes Absolute: 0.3 10*3/uL (ref 0.1–1.0)
Monocytes Relative: 11 %
Neutro Abs: 0.5 10*3/uL — ABNORMAL LOW (ref 1.7–7.7)
Neutrophils Relative %: 18 %
Platelet Count: 205 10*3/uL (ref 150–400)
RBC: 3.99 MIL/uL (ref 3.87–5.11)
RDW: 16.5 % — ABNORMAL HIGH (ref 11.5–15.5)
WBC Count: 2.7 10*3/uL — ABNORMAL LOW (ref 4.0–10.5)
nRBC: 0 % (ref 0.0–0.2)

## 2019-11-24 LAB — RETICULOCYTES
Immature Retic Fract: 13.5 % (ref 2.3–15.9)
RBC.: 3.96 MIL/uL (ref 3.87–5.11)
Retic Count, Absolute: 29.3 10*3/uL (ref 19.0–186.0)
Retic Ct Pct: 0.7 % (ref 0.4–3.1)

## 2019-11-24 LAB — SAVE SMEAR(SSMR), FOR PROVIDER SLIDE REVIEW

## 2019-11-25 LAB — FERRITIN: Ferritin: 298 ng/mL (ref 11–307)

## 2019-11-25 LAB — IRON AND TIBC
Iron: 27 ug/dL — ABNORMAL LOW (ref 41–142)
Saturation Ratios: 13 % — ABNORMAL LOW (ref 21–57)
TIBC: 206 ug/dL — ABNORMAL LOW (ref 236–444)
UIBC: 180 ug/dL (ref 120–384)

## 2019-11-27 ENCOUNTER — Other Ambulatory Visit: Payer: Self-pay | Admitting: Family

## 2019-12-01 ENCOUNTER — Encounter: Payer: Medicare Other | Admitting: Obstetrics and Gynecology

## 2019-12-01 ENCOUNTER — Other Ambulatory Visit: Payer: Self-pay

## 2019-12-01 ENCOUNTER — Inpatient Hospital Stay: Payer: Medicare Other | Attending: Hematology & Oncology

## 2019-12-01 VITALS — BP 142/57 | HR 89 | Temp 96.6°F | Resp 17

## 2019-12-01 DIAGNOSIS — K909 Intestinal malabsorption, unspecified: Secondary | ICD-10-CM | POA: Insufficient documentation

## 2019-12-01 DIAGNOSIS — D508 Other iron deficiency anemias: Secondary | ICD-10-CM | POA: Diagnosis present

## 2019-12-01 MED ORDER — SODIUM CHLORIDE 0.9 % IV SOLN
200.0000 mg | Freq: Once | INTRAVENOUS | Status: AC
  Administered 2019-12-01: 200 mg via INTRAVENOUS
  Filled 2019-12-01: qty 200

## 2019-12-01 MED ORDER — SODIUM CHLORIDE 0.9 % IV SOLN
Freq: Once | INTRAVENOUS | Status: AC
  Filled 2019-12-01: qty 250

## 2019-12-01 NOTE — Patient Instructions (Signed)

## 2019-12-02 ENCOUNTER — Ambulatory Visit: Payer: Medicare Other | Admitting: Allergy and Immunology

## 2019-12-08 ENCOUNTER — Inpatient Hospital Stay: Payer: Medicare Other

## 2019-12-08 ENCOUNTER — Other Ambulatory Visit: Payer: Self-pay

## 2019-12-08 VITALS — BP 132/66 | HR 75 | Temp 96.8°F | Resp 17

## 2019-12-08 DIAGNOSIS — D508 Other iron deficiency anemias: Secondary | ICD-10-CM | POA: Diagnosis not present

## 2019-12-08 MED ORDER — SODIUM CHLORIDE 0.9 % IV SOLN
200.0000 mg | Freq: Once | INTRAVENOUS | Status: AC
  Administered 2019-12-08: 200 mg via INTRAVENOUS
  Filled 2019-12-08: qty 200

## 2019-12-08 MED ORDER — SODIUM CHLORIDE 0.9 % IV SOLN
Freq: Once | INTRAVENOUS | Status: AC
  Filled 2019-12-08: qty 250

## 2019-12-08 NOTE — Patient Instructions (Signed)

## 2019-12-15 ENCOUNTER — Encounter: Payer: Self-pay | Admitting: Obstetrics and Gynecology

## 2019-12-15 ENCOUNTER — Ambulatory Visit: Payer: Medicare Other | Admitting: Obstetrics and Gynecology

## 2019-12-15 ENCOUNTER — Other Ambulatory Visit: Payer: Self-pay

## 2019-12-15 ENCOUNTER — Ambulatory Visit: Payer: Medicare Other

## 2019-12-15 VITALS — BP 124/82 | Ht 65.0 in | Wt 168.0 lb

## 2019-12-15 DIAGNOSIS — Z1272 Encounter for screening for malignant neoplasm of vagina: Secondary | ICD-10-CM | POA: Diagnosis not present

## 2019-12-15 DIAGNOSIS — Z124 Encounter for screening for malignant neoplasm of cervix: Secondary | ICD-10-CM | POA: Diagnosis not present

## 2019-12-15 DIAGNOSIS — Z01419 Encounter for gynecological examination (general) (routine) without abnormal findings: Secondary | ICD-10-CM

## 2019-12-15 DIAGNOSIS — A6000 Herpesviral infection of urogenital system, unspecified: Secondary | ICD-10-CM | POA: Diagnosis not present

## 2019-12-15 NOTE — Progress Notes (Signed)
Kristen Murray Jun 19, 1946 [id number]  SUBJECTIVE:  73 y.o. G3P3003 female for annual routine gynecologic exam and Pap smear. She has no gynecologic concerns. No concerns with lesions, taking Valtrex prophylaxis.    Current Outpatient Medications  Medication Sig Dispense Refill  . cyclobenzaprine (FLEXERIL) 10 MG tablet 10 mg daily as needed.    . fluorometholone (FML) 0.1 % ophthalmic suspension Place 1 drop into both eyes 3 (three) times daily.    . fluticasone (FLONASE) 50 MCG/ACT nasal spray Place 1 spray into both nostrils 2 (two) times daily. 16 g 5  . glucose blood (FREESTYLE LITE) test strip CHECK BLOOD SUGAR ONCE DAILY AS DIRECTED.  DX:790.29 100 each 12  . ipratropium (ATROVENT) 0.06 % nasal spray Place 1-2 sprays into both nostrils every 6 (six) hours as needed for rhinitis. 15 mL 5  . Lancets (FREESTYLE) lancets Check blood sugar once daily as directed DX:790.29 (FREESTYLE FREEDOM LITE) 100 each 1  . montelukast (SINGULAIR) 10 MG tablet Take 1 tablet (10 mg total) by mouth at bedtime. 30 tablet 5  . Multiple Vitamin (MULTIVITAMIN) tablet Take 1 tablet by mouth daily.      . rosuvastatin (CRESTOR) 5 MG tablet     . SITagliptin Phosphate (JANUVIA PO) Take by mouth.    . trimethoprim (TRIMPEX) 100 MG tablet Take 100 mg by mouth daily.    . valACYclovir (VALTREX) 500 MG tablet Take 1 tablet (500 mg total) by mouth 2 (two) times daily. For one week then one pill daily for 6 months 90 tablet 2  . verapamil (CALAN-SR) 180 MG CR tablet 180 mg at bedtime.      No current facility-administered medications for this visit.   Allergies: Codeine, Propoxyphene, Propoxyphene hcl, Latex, Macrodantin [nitrofurantoin macrocrystal], Other, Tizanidine, Ciprofloxacin, and Tramadol  Patient's last menstrual period was 07/30/1990.  Past medical history,surgical history, problem list, medications, allergies, family history and social history were all reviewed and documented as reviewed in the EPIC  chart.  ROS:  Feeling well. No dyspnea or chest pain on exertion.  No abdominal pain, change in bowel habits, black or bloody stools.  No urinary tract symptoms. GYN ROS: no abnormal bleeding, pelvic pain or discharge, no breast pain or new or enlarging lumps on self exam. No neurological complaints.  OBJECTIVE:  BP 124/82   Ht 5\' 5"  (1.651 m)   Wt 168 lb (76.2 kg)   LMP 07/30/1990   BMI 27.96 kg/m  The patient appears well, alert, oriented x 3, in no distress. ENT normal.  Neck supple. No cervical or supraclavicular adenopathy or thyromegaly.  Lungs are clear, good air entry, no wheezes, rhonchi or rales. S1 and S2 normal, no murmurs, regular rate and rhythm.  Abdomen soft without tenderness, guarding, mass or organomegaly.  Neurological is normal, no focal findings.  BREAST EXAM: breasts appear normal, no suspicious masses, no skin or nipple changes or axillary nodes  PELVIC EXAM: VULVA: normal appearing vulva with no masses, tenderness or lesions, atrophic changes noted, VAGINA: normal appearing vagina with normal color and discharge, no lesions, CERVIX: surgically absent, UTERUS: surgically absent, vaginal cuff well healed, ADNEXA: no masses, RECTAL: normal rectal, no masses, PAP: Pap smear of vaginal cuff done today, thin-prep method  Chaperone present during the examination  ASSESSMENT:  74 y.o. [id number] here for annual gynecologic exam  PLAN:   1. Postmenopausal.  Status post TAH/BSO for leiomyoma.  No significant menopausal symptoms. 2. Pap smear/HPV 2016.  No history of abnormal Pap smears. Options  to stop screening per current screening guidelines versus repeating at 5-year interval reviewed. She would like to continue screening so a Pap smear is performed today.   3. Mammogram 12/2018. Will continue with annual mammography. Breast exam normal today. 4. Colonoscopy 2011.  Recommended that she pursue getting this done this year, and she indicates she has contact information to  facilitate this. 5. DEXA 2017 was normal.  Recommended repeat at 5 year interval, which would be 2022.  Will discuss at next annual exam. 6. History of genital HSV.  Taking Valtrex prophylaxis and says she does not need a refill at this time.  Will contact us if that changes. 7. Health maintenance.  No lab work as she has this completed with her primary care provider.   Return annually or sooner, prn.  Joseph Pierini MD  12/15/19

## 2019-12-15 NOTE — Patient Instructions (Signed)
We will plan to do a bone density scan next year I will let you know about your Pap smear results

## 2019-12-16 ENCOUNTER — Inpatient Hospital Stay: Payer: Medicare Other

## 2019-12-16 VITALS — BP 143/70 | HR 70 | Resp 18

## 2019-12-16 DIAGNOSIS — D508 Other iron deficiency anemias: Secondary | ICD-10-CM

## 2019-12-16 LAB — PAP IG W/ RFLX HPV ASCU

## 2019-12-16 MED ORDER — SODIUM CHLORIDE 0.9 % IV SOLN
200.0000 mg | Freq: Once | INTRAVENOUS | Status: AC
  Administered 2019-12-16: 200 mg via INTRAVENOUS
  Filled 2019-12-16: qty 10

## 2019-12-16 MED ORDER — SODIUM CHLORIDE 0.9 % IV SOLN
Freq: Once | INTRAVENOUS | Status: AC
  Filled 2019-12-16: qty 250

## 2019-12-16 NOTE — Patient Instructions (Signed)

## 2019-12-22 ENCOUNTER — Inpatient Hospital Stay: Payer: Medicare Other

## 2019-12-22 ENCOUNTER — Other Ambulatory Visit: Payer: Self-pay

## 2019-12-22 VITALS — BP 141/67 | HR 76 | Temp 97.1°F | Resp 17

## 2019-12-22 DIAGNOSIS — D508 Other iron deficiency anemias: Secondary | ICD-10-CM | POA: Diagnosis not present

## 2019-12-22 MED ORDER — SODIUM CHLORIDE 0.9 % IV SOLN
200.0000 mg | Freq: Once | INTRAVENOUS | Status: AC
  Administered 2019-12-22: 200 mg via INTRAVENOUS
  Filled 2019-12-22: qty 200

## 2019-12-22 MED ORDER — SODIUM CHLORIDE 0.9 % IV SOLN
Freq: Once | INTRAVENOUS | Status: AC
  Filled 2019-12-22: qty 250

## 2019-12-22 NOTE — Patient Instructions (Signed)

## 2020-01-10 ENCOUNTER — Encounter (HOSPITAL_BASED_OUTPATIENT_CLINIC_OR_DEPARTMENT_OTHER): Payer: Self-pay | Admitting: Emergency Medicine

## 2020-01-10 ENCOUNTER — Emergency Department (HOSPITAL_BASED_OUTPATIENT_CLINIC_OR_DEPARTMENT_OTHER): Payer: Medicare Other

## 2020-01-10 ENCOUNTER — Emergency Department (HOSPITAL_BASED_OUTPATIENT_CLINIC_OR_DEPARTMENT_OTHER)
Admission: EM | Admit: 2020-01-10 | Discharge: 2020-01-10 | Disposition: A | Discharge status: Home / Self Care | Payer: Medicare Other | Attending: Emergency Medicine | Admitting: Emergency Medicine

## 2020-01-10 ENCOUNTER — Other Ambulatory Visit: Payer: Self-pay

## 2020-01-10 DIAGNOSIS — J302 Other seasonal allergic rhinitis: Secondary | ICD-10-CM

## 2020-01-10 DIAGNOSIS — Z79899 Other long term (current) drug therapy: Secondary | ICD-10-CM | POA: Diagnosis not present

## 2020-01-10 DIAGNOSIS — E119 Type 2 diabetes mellitus without complications: Secondary | ICD-10-CM | POA: Insufficient documentation

## 2020-01-10 DIAGNOSIS — J0100 Acute maxillary sinusitis, unspecified: Secondary | ICD-10-CM

## 2020-01-10 DIAGNOSIS — I10 Essential (primary) hypertension: Secondary | ICD-10-CM | POA: Diagnosis not present

## 2020-01-10 DIAGNOSIS — Z7984 Long term (current) use of oral hypoglycemic drugs: Secondary | ICD-10-CM | POA: Insufficient documentation

## 2020-01-10 DIAGNOSIS — D72819 Decreased white blood cell count, unspecified: Secondary | ICD-10-CM | POA: Diagnosis not present

## 2020-01-10 DIAGNOSIS — Z91040 Latex allergy status: Secondary | ICD-10-CM | POA: Diagnosis not present

## 2020-01-10 DIAGNOSIS — Z20822 Contact with and (suspected) exposure to covid-19: Secondary | ICD-10-CM | POA: Diagnosis not present

## 2020-01-10 DIAGNOSIS — J029 Acute pharyngitis, unspecified: Secondary | ICD-10-CM | POA: Diagnosis present

## 2020-01-10 LAB — CBC WITH DIFFERENTIAL/PLATELET
Abs Immature Granulocytes: 0.01 10*3/uL (ref 0.00–0.07)
Basophils Absolute: 0 10*3/uL (ref 0.0–0.1)
Basophils Relative: 0 %
Eosinophils Absolute: 0 10*3/uL (ref 0.0–0.5)
Eosinophils Relative: 1 %
HCT: 39.3 % (ref 36.0–46.0)
Hemoglobin: 12.5 g/dL (ref 12.0–15.0)
Immature Granulocytes: 1 %
Lymphocytes Relative: 53 %
Lymphs Abs: 1.1 10*3/uL (ref 0.7–4.0)
MCH: 27.7 pg (ref 26.0–34.0)
MCHC: 31.8 g/dL (ref 30.0–36.0)
MCV: 87.1 fL (ref 80.0–100.0)
Monocytes Absolute: 0.3 10*3/uL (ref 0.1–1.0)
Monocytes Relative: 14 %
Neutro Abs: 0.6 10*3/uL — ABNORMAL LOW (ref 1.7–7.7)
Neutrophils Relative %: 31 %
Platelets: 173 10*3/uL (ref 150–400)
RBC: 4.51 MIL/uL (ref 3.87–5.11)
RDW: 17.6 % — ABNORMAL HIGH (ref 11.5–15.5)
WBC: 1.9 10*3/uL — ABNORMAL LOW (ref 4.0–10.5)
nRBC: 0 % (ref 0.0–0.2)

## 2020-01-10 LAB — RESPIRATORY PANEL BY RT PCR (FLU A&B, COVID)
Influenza A by PCR: NEGATIVE
Influenza B by PCR: NEGATIVE
SARS Coronavirus 2 by RT PCR: NEGATIVE

## 2020-01-10 LAB — BASIC METABOLIC PANEL
Anion gap: 10 (ref 5–15)
BUN: 11 mg/dL (ref 8–23)
CO2: 24 mmol/L (ref 22–32)
Calcium: 9.1 mg/dL (ref 8.9–10.3)
Chloride: 101 mmol/L (ref 98–111)
Creatinine, Ser: 0.63 mg/dL (ref 0.44–1.00)
GFR calc Af Amer: >60 mL/min (ref >60)
GFR calc non Af Amer: >60 mL/min (ref >60)
Glucose, Bld: 264 mg/dL — ABNORMAL HIGH (ref 70–99)
Potassium: 3.7 mmol/L (ref 3.5–5.1)
Sodium: 135 mmol/L (ref 135–145)

## 2020-01-10 LAB — GROUP A STREP BY PCR: Group A Strep by PCR: NOT DETECTED

## 2020-01-10 LAB — TROPONIN I (HIGH SENSITIVITY): Troponin I (High Sensitivity): 4 ng/L (ref <18)

## 2020-01-10 MED ORDER — AMOXICILLIN 500 MG PO CAPS: 500.0000 mg | ORAL_CAPSULE | Freq: Three times a day (TID) | ORAL | 0 refills | Status: DC

## 2020-01-10 MED ORDER — SODIUM CHLORIDE 0.9 % IV BOLUS
500.0000 mL | Freq: Once | INTRAVENOUS | Status: AC
  Administered 2020-01-10: 500 mL via INTRAVENOUS

## 2020-01-10 MED ORDER — LORATADINE 10 MG PO TABS: 10.0000 mg | ORAL_TABLET | Freq: Every day | ORAL | 0 refills | Status: DC

## 2020-01-10 MED ORDER — AMOXICILLIN 500 MG PO CAPS
500.0000 mg | ORAL_CAPSULE | Freq: Once | ORAL | Status: AC
  Administered 2020-01-10: 500 mg via ORAL
  Filled 2020-01-10: qty 1

## 2020-01-10 MED ORDER — OXYMETAZOLINE HCL 0.05 % NA SOLN
1.0000 | Freq: Once | NASAL | Status: AC
  Administered 2020-01-10: 1 via NASAL
  Filled 2020-01-10: qty 30

## 2020-01-10 MED ORDER — LORATADINE 10 MG PO TABS
10.0000 mg | ORAL_TABLET | Freq: Once | ORAL | Status: AC
  Administered 2020-01-10: 10 mg via ORAL
  Filled 2020-01-10: qty 1

## 2020-01-10 MED ORDER — KETOROLAC TROMETHAMINE 30 MG/ML IJ SOLN
30.0000 mg | Freq: Once | INTRAMUSCULAR | Status: AC
  Administered 2020-01-10: 11:00:00 30 mg via INTRAVENOUS
  Filled 2020-01-10: qty 1

## 2020-01-10 MED ORDER — DEXAMETHASONE SODIUM PHOSPHATE 10 MG/ML IJ SOLN
10.0000 mg | Freq: Once | INTRAMUSCULAR | Status: AC
  Administered 2020-01-10: 10 mg via INTRAVENOUS
  Filled 2020-01-10: qty 1

## 2020-01-10 NOTE — ED Provider Notes (Addendum)
[facility] DEPART[facility] Provider Note   CSN: [id number] Arrival date & time: 01/10/20  1034     History Chief Complaint  Patient presents with  . Sore Throat    Kristen Murray is a 74 y.o. female.  Pt presents to the ED today with sore throat and sneezing for a week.  She has pain in her jaw and down her neck.  She has pain in her sinuses and has had a cough.  She has not had the Covid vaccine due to a Shingles vaccine reaction, she's afraid to take the Covid vaccine.  Pt denies any f/c.  No known covid exposures.  Pt said her rhinorrhea has been clear and she's not been bringing up phlegm.          Past Medical History:  Diagnosis Date  . Arthritis   . Borderline abnormal TFTs    as a teen  . Diabetes mellitus without complication ([place])   . E. coli UTI 10/05/12   [facility]; resistant only to Septra DS & tetracycline  . Eye infection   . Fibromyalgia   . GERD (gastroesophageal reflux disease)   . Headache(784.0)   . 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 HSV-1 infection   . HTN (hypertension)   . Hyperglycemia   . Hyperlipidemia   . Meningioma ([place])   . Nephrolithiasis    Dr Amalia Hailey, [facility], Hx of [3][  . Polyp of colon   . Radiculopathy     Patient Active Problem List   Diagnosis Date Noted  . IDA (iron deficiency anemia) 07/18/2019  . Pain in the chest   . Chest pain at rest 11/26/2014  . Shingles 05/14/2014  . 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 Vaginal atrophy 10/16/2012  . Menopause 10/16/2012  . Chest pain, atypical 02/19/2012  . Type 2 diabetes mellitus with neurological manifestations, controlled ([place]) 02/19/2012  . COSTOCHONDRITIS 11/16/2010  . DISTURBANCES OF SENSATION OF SMELL AND TASTE 04/05/2010  . NEPHROLITHIASIS, HX OF 02/08/2010  . EDEMA 10/01/2009  . Brachial neuritis or radiculitis NOS 06/14/2009  . EXTERNAL HEMORRHOIDS 05/25/2009  . MUSCLE PAIN 05/17/2009  . HYPERLIPIDEMIA 04/23/2009  . ABNORMAL  THYROID FUNCTION TESTS 04/23/2009  . MENINGIOMA 12/26/2007  . HEADACHE 12/26/2007  . RADICULOPATHY 04/02/2007  . HYPERTENSION 11/21/2006  . GERD 11/21/2006    Past Surgical History:  Procedure Laterality Date  . ABDOMINAL HYSTERECTOMY     TAH/BSO --fibroids, no cancer  . BALLOON DILATION N/A 07/20/2014   Procedure: BALLOON DILATION;  Surgeon: Garlan Fair, MD;  Location: [facility];  Service: Endoscopy;  Laterality: N/A;  . BREAST BIOPSY     Right  . CARPAL TUNNEL RELEASE     & trigger thumb RUE; Dr Alphonzo Cruise  . COLONOSCOPY W/ POLYPECTOMY  07/2004   Neg 2011; Dr Earle Gell  . CYSTOSCOPY  11/2009   Neg  . ESOPHAGOGASTRODUODENOSCOPY N/A 07/20/2014   Procedure: ESOPHAGOGASTRODUODENOSCOPY (EGD);  Surgeon: Garlan Fair, MD;  Location: [facility];  Service: Endoscopy;  Laterality: N/A;  . FLEXIBLE BRONCHOSCOPY W/ UPPER ENDOSCOPY     neg  . KNEE ARTHROSCOPY     Left  . ROTATOR CUFF REPAIR     R shoulder  . TONSILLECTOMY    . TRIGGER FINGER RELEASE Right    Right Thumb     OB History    Gravida  3   Para  3   Term  3   Preterm  AB      Living  3     SAB      TAB      Ectopic      Multiple      Live Births  3           Family History  Problem Relation Age of Onset  . Alcohol abuse Mother   . Diabetes Maternal Aunt   . Diabetes Maternal Uncle   . Diabetes Maternal Grandmother   . Tuberculosis Maternal Uncle   . Other Father        unknown  . Coronary artery disease Neg Hx     Social History   Tobacco Use  . Smoking status: Never Smoker  . Smokeless tobacco: Never Used  Substance Use Topics  . Alcohol use: No    Alcohol/week: 0.0 standard drinks  . Drug use: No    Home Medications Prior to Admission medications   Medication Sig Start Date End Date Taking? Authorizing Provider  amoxicillin (AMOXIL) 500 MG capsule Take 1 capsule (500 mg total) by mouth 3 (three) times daily. 01/10/20   Isla Pence, MD  cyclobenzaprine  (FLEXERIL) 10 MG tablet 10 mg daily as needed.    [provider]  fluorometholone (FML) 0.1 % ophthalmic suspension Place 1 drop into both eyes 3 (three) times daily.    [provider]  fluticasone (FLONASE) 50 MCG/ACT nasal spray Place 1 spray into both nostrils 2 (two) times daily. 04/01/19   Kozlow, Donnamarie Poag, MD  glucose blood (FREESTYLE LITE) test strip CHECK BLOOD SUGAR ONCE DAILY AS DIRECTED.  DX:790.29 01/09/14   Hendricks Limes, MD  ipratropium (ATROVENT) 0.06 % nasal spray Place 1-2 sprays into both nostrils every 6 (six) hours as needed for rhinitis. 09/30/19   Kozlow, Donnamarie Poag, MD  Lancets (FREESTYLE) lancets Check blood sugar once daily as directed DX:790.29 (FREESTYLE FREEDOM LITE) 01/09/14   Hendricks Limes, MD  loratadine (CLARITIN) 10 MG tablet Take 1 tablet (10 mg total) by mouth daily. 01/10/20   Isla Pence, MD  montelukast (SINGULAIR) 10 MG tablet Take 1 tablet (10 mg total) by mouth at bedtime. 09/30/19   Kozlow, Donnamarie Poag, MD  Multiple Vitamin (MULTIVITAMIN) tablet Take 1 tablet by mouth daily.      [provider]  rosuvastatin (CRESTOR) 5 MG tablet  11/20/19   [provider]  SITagliptin Phosphate (JANUVIA PO) Take by mouth.    [provider]  trimethoprim (TRIMPEX) 100 MG tablet Take 100 mg by mouth daily.    [provider]  valACYclovir (VALTREX) 500 MG tablet Take 1 tablet (500 mg total) by mouth 2 (two) times daily. For one week then one pill daily for 6 months 07/08/19   Fontaine, Belinda Block, MD  verapamil (CALAN-SR) 180 MG CR tablet 180 mg at bedtime.  01/02/17   [provider]    Allergies    Codeine, Propoxyphene, Propoxyphene hcl, Latex, Macrodantin [nitrofurantoin macrocrystal], Other, Tizanidine, Ciprofloxacin, and Tramadol  Review of Systems   Review of Systems  HENT: Positive for congestion, sinus pressure, sneezing and sore throat.   Respiratory: Positive for cough.   All other systems reviewed and  are negative.   Physical Exam Updated Vital Signs BP (!) 163/86 (BP Location: Right Arm)   Pulse 100   Temp 99.2 F (37.3 C) (Oral)   Resp 18   Ht 5\' 5"  (1.651 m)   Wt 73 kg   LMP 07/30/1990   SpO2 100%  BMI 26.79 kg/m   Physical Exam Vitals and nursing note reviewed.  Constitutional:      Appearance: She is well-developed.  HENT:     Head: Normocephalic and atraumatic.     Right Ear: Ear canal normal.     Left Ear: Ear canal normal.     Nose:     Right Sinus: Maxillary sinus tenderness present.     Left Sinus: Maxillary sinus tenderness present.     Mouth/Throat:     Mouth: Mucous membranes are moist.     Pharynx: Oropharynx is clear.  Eyes:     Conjunctiva/sclera: Conjunctivae normal.     Pupils: Pupils are equal, round, and reactive to light.  Cardiovascular:     Rate and Rhythm: Normal rate and regular rhythm.  Pulmonary:     Effort: Pulmonary effort is normal.     Breath sounds: Normal breath sounds.  Abdominal:     General: Bowel sounds are normal.     Palpations: Abdomen is soft.  Musculoskeletal:     Cervical back: Normal range of motion and neck supple.  Lymphadenopathy:     Cervical: Cervical adenopathy present.  Skin:    General: Skin is warm.     Capillary Refill: Capillary refill takes less than 2 seconds.  Neurological:     General: No focal deficit present.     Mental Status: She is alert and oriented to person, place, and time.  Psychiatric:        Mood and Affect: Mood normal.        Behavior: Behavior normal.     ED Results / Procedures / Treatments   Labs (all labs ordered are listed, but only abnormal results are displayed) Labs Reviewed  CBC WITH DIFFERENTIAL/PLATELET - Abnormal; Notable for the following components:      Result Value   WBC 1.9 (*)    RDW 17.6 (*)    Neutro Abs 0.6 (*)    All other components within normal limits  BASIC METABOLIC PANEL - Abnormal; Notable for the following components:   Glucose, Bld 264 (*)      All other components within normal limits  RESPIRATORY PANEL BY RT PCR (FLU A&B, COVID)  GROUP A STREP BY PCR  TROPONIN I (HIGH SENSITIVITY)    EKG EKG Interpretation  Date/Time:  Saturday January 10 2020 11:55:31 EST Ventricular Rate:  103 PR Interval:    QRS Duration: 78 QT Interval:  324 QTC Calculation: 425 R Axis:   75 Text Interpretation: Sinus tachycardia Minimal ST elevation, inferior leads Since last tracing rate faster Confirmed by Isla Pence [phone]) on 01/10/2020 12:54:47 PM   Radiology DG Chest Portable 1 View  Result Date: 01/10/2020 CLINICAL DATA:  Shortness of breath and cough EXAM: PORTABLE CHEST 1 VIEW COMPARISON:  05/30/2018 FINDINGS: The cardiomediastinal silhouette is unremarkable. There is no evidence of focal airspace disease, pulmonary edema, suspicious pulmonary nodule/mass, pleural effusion, or pneumothorax. No acute bony abnormalities are identified. IMPRESSION: No active disease. Electronically Signed   By: Margarette Canada M.D.   On: 01/10/2020 11:43    Procedures Procedures (including critical care time)  Medications Ordered in ED Medications  sodium chloride 0.9 % bolus 500 mL (0 mLs Intravenous Stopped 01/10/20 1248)  oxymetazoline (AFRIN) 0.05 % nasal spray 1 spray (1 spray Each Nare Given 01/10/20 1118)  dexamethasone (DECADRON) injection 10 mg (10 mg Intravenous Given 01/10/20 1119)  ketorolac (TORADOL) 30 MG/ML injection 30 mg (30 mg Intravenous Given 01/10/20 1119)  loratadine (  CLARITIN) tablet 10 mg (10 mg Oral Given 01/10/20 1120)  amoxicillin (AMOXIL) capsule 500 mg (500 mg Oral Given 01/10/20 1253)    ED Course  I have reviewed the triage vital signs and the nursing notes.  Pertinent labs & imaging results that were available during my care of the patient were reviewed by me and considered in my medical decision making (see chart for details).    MDM Rules/Calculators/A&P                      Pt's HR is back to nl.  She is feeling much  better.  Pt does not have Covid or the flu.  Strep neg.  Pt does have progressive leukopenia for which she's been followed by Dr. Marin Olp. She has a hx of anemia, but h/h is ok now.  Last iron infusion was 2/22.  Pt's sx likely started as seasonal allergies, but now have caused sinusitis and bronchitis.  Pt is stable for d/c.  Return if worse.  Kristen Murray was evaluated in Emergency Department on 01/10/2020 for the symptoms described in the history of present illness. She was evaluated in the context of the global COVID-19 pandemic, which necessitated consideration that the patient might be at risk for infection with the SARS-CoV-2 virus that causes COVID-19. Institutional protocols and algorithms that pertain to the evaluation of patients at risk for COVID-19 are in a state of rapid change based on information released by regulatory bodies including the CDC and federal and state organizations. These policies and algorithms were followed during the patient's care in the ED. Final Clinical Impression(s) / ED Diagnoses Final diagnoses:  Acute non-recurrent maxillary sinusitis  Leukopenia, unspecified type  Seasonal allergies  COVID-19 virus not detected    Rx / DC Orders ED Discharge Orders         Ordered    loratadine (CLARITIN) 10 MG tablet  Daily     01/10/20 1227    amoxicillin (AMOXIL) 500 MG capsule  3 times daily     01/10/20 1227           Isla Pence, MD 01/10/20 1230    Isla Pence, MD 01/10/20 1255

## 2020-01-10 NOTE — ED Triage Notes (Signed)
Sore throat and sneezing x 1 week.

## 2020-01-13 ENCOUNTER — Encounter: Payer: Self-pay | Admitting: Gynecology

## 2020-01-20 ENCOUNTER — Other Ambulatory Visit: Payer: Self-pay | Admitting: Hematology & Oncology

## 2020-01-26 ENCOUNTER — Encounter (HOSPITAL_BASED_OUTPATIENT_CLINIC_OR_DEPARTMENT_OTHER): Payer: Self-pay | Admitting: *Deleted

## 2020-01-26 ENCOUNTER — Emergency Department (HOSPITAL_BASED_OUTPATIENT_CLINIC_OR_DEPARTMENT_OTHER): Payer: Medicare Other

## 2020-01-26 ENCOUNTER — Inpatient Hospital Stay (HOSPITAL_BASED_OUTPATIENT_CLINIC_OR_DEPARTMENT_OTHER)
Admission: EM | Admit: 2020-01-26 | Discharge: 2020-01-30 | DRG: 310 | Disposition: A | Discharge status: Home / Self Care | Payer: Medicare Other | Attending: Internal Medicine | Admitting: Internal Medicine

## 2020-01-26 ENCOUNTER — Other Ambulatory Visit: Payer: Self-pay

## 2020-01-26 DIAGNOSIS — R002 Palpitations: Secondary | ICD-10-CM | POA: Diagnosis present

## 2020-01-26 DIAGNOSIS — I10 Essential (primary) hypertension: Secondary | ICD-10-CM | POA: Diagnosis present

## 2020-01-26 DIAGNOSIS — G43909 Migraine, unspecified, not intractable, without status migrainosus: Secondary | ICD-10-CM | POA: Diagnosis present

## 2020-01-26 DIAGNOSIS — E663 Overweight: Secondary | ICD-10-CM | POA: Diagnosis present

## 2020-01-26 DIAGNOSIS — D509 Iron deficiency anemia, unspecified: Secondary | ICD-10-CM | POA: Diagnosis not present

## 2020-01-26 DIAGNOSIS — Z885 Allergy status to narcotic agent status: Secondary | ICD-10-CM | POA: Diagnosis not present

## 2020-01-26 DIAGNOSIS — Z20822 Contact with and (suspected) exposure to covid-19: Secondary | ICD-10-CM | POA: Diagnosis present

## 2020-01-26 DIAGNOSIS — I4892 Unspecified atrial flutter: Secondary | ICD-10-CM | POA: Diagnosis present

## 2020-01-26 DIAGNOSIS — R6884 Jaw pain: Secondary | ICD-10-CM | POA: Diagnosis not present

## 2020-01-26 DIAGNOSIS — R000 Tachycardia, unspecified: Secondary | ICD-10-CM | POA: Diagnosis present

## 2020-01-26 DIAGNOSIS — M797 Fibromyalgia: Secondary | ICD-10-CM | POA: Diagnosis not present

## 2020-01-26 DIAGNOSIS — I4891 Unspecified atrial fibrillation: Secondary | ICD-10-CM | POA: Diagnosis present

## 2020-01-26 DIAGNOSIS — Z881 Allergy status to other antibiotic agents status: Secondary | ICD-10-CM

## 2020-01-26 DIAGNOSIS — Z91040 Latex allergy status: Secondary | ICD-10-CM

## 2020-01-26 DIAGNOSIS — M5410 Radiculopathy, site unspecified: Secondary | ICD-10-CM | POA: Diagnosis not present

## 2020-01-26 DIAGNOSIS — E042 Nontoxic multinodular goiter: Secondary | ICD-10-CM | POA: Diagnosis present

## 2020-01-26 DIAGNOSIS — R0789 Other chest pain: Secondary | ICD-10-CM | POA: Diagnosis not present

## 2020-01-26 DIAGNOSIS — Z79899 Other long term (current) drug therapy: Secondary | ICD-10-CM

## 2020-01-26 DIAGNOSIS — E785 Hyperlipidemia, unspecified: Secondary | ICD-10-CM | POA: Diagnosis not present

## 2020-01-26 DIAGNOSIS — I480 Paroxysmal atrial fibrillation: Principal | ICD-10-CM | POA: Diagnosis present

## 2020-01-26 DIAGNOSIS — Z888 Allergy status to other drugs, medicaments and biological substances status: Secondary | ICD-10-CM

## 2020-01-26 DIAGNOSIS — M1990 Unspecified osteoarthritis, unspecified site: Secondary | ICD-10-CM | POA: Diagnosis not present

## 2020-01-26 DIAGNOSIS — K219 Gastro-esophageal reflux disease without esophagitis: Secondary | ICD-10-CM | POA: Diagnosis present

## 2020-01-26 DIAGNOSIS — E119 Type 2 diabetes mellitus without complications: Secondary | ICD-10-CM | POA: Diagnosis not present

## 2020-01-26 DIAGNOSIS — Z833 Family history of diabetes mellitus: Secondary | ICD-10-CM

## 2020-01-26 DIAGNOSIS — Z6826 Body mass index (BMI) 26.0-26.9, adult: Secondary | ICD-10-CM | POA: Diagnosis not present

## 2020-01-26 DIAGNOSIS — R918 Other nonspecific abnormal finding of lung field: Secondary | ICD-10-CM | POA: Diagnosis present

## 2020-01-26 DIAGNOSIS — E041 Nontoxic single thyroid nodule: Secondary | ICD-10-CM

## 2020-01-26 DIAGNOSIS — R079 Chest pain, unspecified: Secondary | ICD-10-CM | POA: Diagnosis present

## 2020-01-26 DIAGNOSIS — E1149 Type 2 diabetes mellitus with other diabetic neurological complication: Secondary | ICD-10-CM | POA: Diagnosis present

## 2020-01-26 DIAGNOSIS — Z811 Family history of alcohol abuse and dependence: Secondary | ICD-10-CM | POA: Diagnosis not present

## 2020-01-26 DIAGNOSIS — D72819 Decreased white blood cell count, unspecified: Secondary | ICD-10-CM | POA: Diagnosis present

## 2020-01-26 DIAGNOSIS — Z8719 Personal history of other diseases of the digestive system: Secondary | ICD-10-CM | POA: Diagnosis not present

## 2020-01-26 LAB — CBC
HCT: 37.9 % (ref 36.0–46.0)
Hemoglobin: 12.4 g/dL (ref 12.0–15.0)
MCH: 28.5 pg (ref 26.0–34.0)
MCHC: 32.7 g/dL (ref 30.0–36.0)
MCV: 87.1 fL (ref 80.0–100.0)
Platelets: 194 10*3/uL (ref 150–400)
RBC: 4.35 MIL/uL (ref 3.87–5.11)
RDW: 16.9 % — ABNORMAL HIGH (ref 11.5–15.5)
WBC: 3.5 10*3/uL — ABNORMAL LOW (ref 4.0–10.5)
nRBC: 0 % (ref 0.0–0.2)

## 2020-01-26 LAB — MAGNESIUM: Magnesium: 2.1 mg/dL (ref 1.7–2.4)

## 2020-01-26 LAB — BASIC METABOLIC PANEL
Anion gap: 9 (ref 5–15)
BUN: 17 mg/dL (ref 8–23)
CO2: 27 mmol/L (ref 22–32)
Calcium: 9.3 mg/dL (ref 8.9–10.3)
Chloride: 102 mmol/L (ref 98–111)
Creatinine, Ser: 0.84 mg/dL (ref 0.44–1.00)
GFR calc Af Amer: >60 mL/min (ref >60)
GFR calc non Af Amer: >60 mL/min (ref >60)
Glucose, Bld: 209 mg/dL — ABNORMAL HIGH (ref 70–99)
Potassium: 4.4 mmol/L (ref 3.5–5.1)
Sodium: 138 mmol/L (ref 135–145)

## 2020-01-26 LAB — D-DIMER, QUANTITATIVE: D-Dimer, Quant: 1.22 ug/mL-FEU — ABNORMAL HIGH (ref 0.00–0.50)

## 2020-01-26 LAB — TROPONIN I (HIGH SENSITIVITY)
Troponin I (High Sensitivity): 6 ng/L (ref <18)
Troponin I (High Sensitivity): 6 ng/L (ref <18)

## 2020-01-26 MED ORDER — METOPROLOL TARTRATE 5 MG/5ML IV SOLN
5.0000 mg | INTRAVENOUS | Status: AC | PRN
  Administered 2020-01-27 (×3): 5 mg via INTRAVENOUS
  Filled 2020-01-26 (×3): qty 5

## 2020-01-26 MED ORDER — METOPROLOL TARTRATE 5 MG/5ML IV SOLN
5.0000 mg | Freq: Once | INTRAVENOUS | Status: AC
  Administered 2020-01-26: 21:00:00 5 mg via INTRAVENOUS
  Filled 2020-01-26: qty 5

## 2020-01-26 MED ORDER — IOHEXOL 350 MG/ML SOLN
100.0000 mL | Freq: Once | INTRAVENOUS | Status: AC | PRN
  Administered 2020-01-26: 20:00:00 100 mL via INTRAVENOUS

## 2020-01-26 MED ORDER — ASPIRIN 81 MG PO CHEW
324.0000 mg | CHEWABLE_TABLET | Freq: Once | ORAL | Status: AC
  Administered 2020-01-26: 324 mg via ORAL
  Filled 2020-01-26: qty 4

## 2020-01-26 MED ORDER — SODIUM CHLORIDE 0.9% FLUSH
3.0000 mL | Freq: Once | INTRAVENOUS | Status: DC
  Filled 2020-01-26: qty 3

## 2020-01-26 MED ORDER — SODIUM CHLORIDE 0.9 % IV BOLUS
500.0000 mL | Freq: Once | INTRAVENOUS | Status: AC
  Administered 2020-01-26: 500 mL via INTRAVENOUS

## 2020-01-26 NOTE — ED Notes (Signed)
Ambulated to BR, back to room and c/o of bil jaw pain. EKG obtained and given to ED MD

## 2020-01-26 NOTE — ED Notes (Signed)
ED Provider at bedside. 

## 2020-01-26 NOTE — Plan of Care (Addendum)
74 yo F with onset of CP today.  Episodic, and seems to be related to going in and out of what looks like A.Flutter 2:1.  CP free at rest.  Gets up, walks to bathroom, goes into A.Flutter with HR 155 and ST depressions and becomes symptomatic, before converting out of this on her own and becoming asymptomatic again.  Cards will see in consult.  Getting started on IV metoprolol in ED.  Putting in for tele obs bed.  Stress test last year was neg.

## 2020-01-26 NOTE — ED Provider Notes (Signed)
[facility] Emergency Department Provider Note MRN:  [id number]  Arrival date & time: 01/26/20     Chief Complaint   Chest Pain   History of Present Illness   Kristen Murray is a 74 y.o. year-old female with a history of hypertension, diabetes presenting to the ED with chief complaint of chest pain.  Chest pain radiating to the bilateral jaws that started earlier today while walking.  Associated with diaphoresis.  Denies nausea or vomiting.  Associated with shortness of breath as well.  Recent cold-like symptoms about 2 weeks ago.  Currently pain-free and feeling much better at rest.  Denies leg pain or swelling, no abdominal pain, no other complaints.  Review of Systems  A complete 10 system review of systems was obtained and all systems are negative except as noted in the HPI and PMH.   Patient's Health History    Past Medical History:  Diagnosis Date  . Arthritis   . Borderline abnormal TFTs    as a teen  . Diabetes mellitus without complication ([place])   . E. coli UTI 10/05/12   [facility]; resistant only to Septra DS & tetracycline  . Eye infection   . Fibromyalgia   . GERD (gastroesophageal reflux disease)   . Headache(784.0)   . 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 HSV-1 infection   . HTN (hypertension)   . Hyperglycemia   . Hyperlipidemia   . Meningioma ([place])   . Nephrolithiasis    Dr Amalia Hailey, [facility], Hx of [3][  . Polyp of colon   . Radiculopathy     Past Surgical History:  Procedure Laterality Date  . ABDOMINAL HYSTERECTOMY     TAH/BSO --fibroids, no cancer  . BALLOON DILATION N/A 07/20/2014   Procedure: BALLOON DILATION;  Surgeon: Garlan Fair, MD;  Location: [facility];  Service: Endoscopy;  Laterality: N/A;  . BREAST BIOPSY     Right  . CARPAL TUNNEL RELEASE     & trigger thumb RUE; Dr Alphonzo Cruise  . COLONOSCOPY W/ POLYPECTOMY  07/2004   Neg 2011; Dr Earle Gell  . CYSTOSCOPY  11/2009   Neg  . ESOPHAGOGASTRODUODENOSCOPY N/A 07/20/2014   Procedure: ESOPHAGOGASTRODUODENOSCOPY (EGD);  Surgeon: Garlan Fair, MD;  Location: [facility];  Service: Endoscopy;  Laterality: N/A;  . FLEXIBLE BRONCHOSCOPY W/ UPPER ENDOSCOPY     neg  . KNEE ARTHROSCOPY     Left  . ROTATOR CUFF REPAIR     R shoulder  . TONSILLECTOMY    . TRIGGER FINGER RELEASE Right    Right Thumb    Family History  Problem Relation Age of Onset  . Alcohol abuse Mother   . Diabetes Maternal Aunt   . Diabetes Maternal Uncle   . Diabetes Maternal Grandmother   . Tuberculosis Maternal Uncle   . Other Father        unknown  . Coronary artery disease Neg Hx     Social History   Socioeconomic History  . Marital status: Married    Spouse name: Not on file  . Number of children: Not on file  . Years of education: Not on file  . Highest education level: Not on file  Occupational History  . Occupation: Estate manager/land agent: [employer]  Tobacco Use  . Smoking status: Never Smoker  . Smokeless tobacco: Never Used  Substance and Sexual Activity  . Alcohol use: No    Alcohol/week:  0.0 standard drinks  . Drug use: No  . Sexual activity: Not Currently    Birth control/protection: Post-menopausal    Comment: 1st intercourse- 20  partners - 1  Other Topics Concern  . Not on file  Social History Narrative   Regular Exercise-no         Social Determinants of Health   Financial Resource Strain:   . Difficulty of Paying Living Expenses:   Food Insecurity:   . Worried About Charity fundraiser in the Last Year:   . Arboriculturist in the Last Year:   Transportation Needs:   . Film/video editor (Medical):   Marland Kitchen Lack of Transportation (Non-Medical):   Physical Activity:   . Days of Exercise per Week:   . Minutes of Exercise per Session:   Stress:   . Feeling of Stress :   Social Connections:   . Frequency of Communication with Friends and Family:   . Frequency of Social  Gatherings with Friends and Family:   . Attends Religious Services:   . Active Member of Clubs or Organizations:   . Attends Archivist Meetings:   Marland Kitchen Marital Status:   Intimate Partner Violence:   . Fear of Current or Ex-Partner:   . Emotionally Abused:   Marland Kitchen Physically Abused:   . Sexually Abused:      Physical Exam   Vitals:   01/26/20 2231 01/26/20 2245  BP: (!) 164/87   Pulse: 77 92  Resp: (!) 23 (!) 23  Temp:    SpO2: 98% 97%    CONSTITUTIONAL: Well-appearing, NAD NEURO:  Alert and oriented x 3, no focal deficits EYES:  eyes equal and reactive ENT/NECK:  no LAD, no JVD CARDIO: Tachycardic rate, well-perfused, normal S1 and S2 PULM:  CTAB no wheezing or rhonchi GI/GU:  normal bowel sounds, non-distended, non-tender MSK/SPINE:  No gross deformities, no edema SKIN:  no rash, atraumatic PSYCH:  Appropriate speech and behavior  *Additional and/or pertinent findings included in MDM below  Diagnostic and Interventional Summary    EKG Interpretation  Date/Time:  Monday January 26 2020 18:01:00 EDT Ventricular Rate:  113 PR Interval:  136 QRS Duration: 76 QT Interval:  322 QTC Calculation: 441 R Axis:   84 Text Interpretation: Sinus tachycardia Otherwise normal ECG Confirmed by Gerlene Fee [phone]) on 01/26/2020 6:22:20 PM      Labs Reviewed  BASIC METABOLIC PANEL - Abnormal; Notable for the following components:      Result Value   Glucose, Bld 209 (*)    All other components within normal limits  CBC - Abnormal; Notable for the following components:   WBC 3.5 (*)    RDW 16.9 (*)    All other components within normal limits  D-DIMER, QUANTITATIVE (NOT AT [facility]) - Abnormal; Notable for the following components:   D-Dimer, Quant 1.22 (*)    All other components within normal limits  SARS CORONAVIRUS 2 (TAT 6-24 HRS)  MAGNESIUM  TROPONIN I (HIGH SENSITIVITY)  TROPONIN I (HIGH SENSITIVITY)    CT ANGIO CHEST PE W OR WO CONTRAST  Final Result    DG  Chest 2 View  Final Result      Medications  sodium chloride flush (NS) 0.9 % injection 3 mL (3 mLs Intravenous Not Given 01/26/20 1813)  aspirin chewable tablet 324 mg (324 mg Oral Given 01/26/20 1825)  iohexol (OMNIPAQUE) 350 MG/ML injection 100 mL (100 mLs Intravenous Contrast Given 01/26/20 1933)  sodium chloride 0.9 %  bolus 500 mL (0 mLs Intravenous Stopped 01/26/20 2203)  metoprolol tartrate (LOPRESSOR) injection 5 mg (5 mg Intravenous Given 01/26/20 2118)     Procedures  /  Critical Care .Critical Care Performed by: Maudie Flakes, MD Authorized by: Maudie Flakes, MD   Critical care provider statement:    Critical care time (minutes):  45   Critical care was necessary to treat or prevent imminent or life-threatening deterioration of the following conditions: Atrial flutter with rapid ventricular response.   Critical care was time spent personally by me on the following activities:  Discussions with consultants, evaluation of patient's response to treatment, examination of patient, ordering and performing treatments and interventions, ordering and review of laboratory studies, ordering and review of radiographic studies, pulse oximetry, re-evaluation of patient's condition, obtaining history from patient or surrogate and review of old charts    ED Course and Medical Decision Making  I have reviewed the triage vital signs, the nursing notes, and pertinent available records from the EMR.  Pertinent labs & imaging results that were available during my care of the patient were reviewed by me and considered in my medical decision making (see below for details).     Question ACS versus PE, pressure-like pain radiating to the jaw makes me favor ACS over PE though patient arrives tachycardic.  Screening with troponin, D-dimer.  EKG is without ischemic features.  While ambulating to the restroom patient became symptomatic again with chest pain radiating to the jaw.  Upon return to the room  she was found to be tachycardic in the 150s, EKG demonstrating atrial flutter with rapid ventricular response.  She seems to be going in and out of tachyarrhythmia, most of the time while sitting at rest she is in sinus rhythm with rates in the 80s to 90s, occasionally sinus tachycardia of 120, and frequent runs of SVT or flutter.  Discussed case with cardiology, who recommends hospitalist admission.  Admitted to hospital service for further care.  Barth Kirks. Sedonia Small, MD [place] [email or web address]  Final Clinical Impressions(s) / ED Diagnoses     ICD-10-CM   1. Atrial flutter with rapid ventricular response (HCC)  I48.92   2. Chest pain, unspecified type  R07.9     ED Discharge Orders    None       Discharge Instructions Discussed with and Provided to Patient:   Discharge Instructions   None       Maudie Flakes, MD 01/26/20 2321

## 2020-01-26 NOTE — ED Triage Notes (Addendum)
CP with radiation to the bilateral jaw that started today while walking. Reports SOB. Pt ambulatory, no acute distress noted. Denies N/V. Pt noted to be diaphoretic in triage.

## 2020-01-27 ENCOUNTER — Encounter (HOSPITAL_COMMUNITY): Payer: Self-pay | Admitting: Internal Medicine

## 2020-01-27 DIAGNOSIS — D509 Iron deficiency anemia, unspecified: Secondary | ICD-10-CM

## 2020-01-27 DIAGNOSIS — I4891 Unspecified atrial fibrillation: Secondary | ICD-10-CM | POA: Diagnosis present

## 2020-01-27 DIAGNOSIS — E041 Nontoxic single thyroid nodule: Secondary | ICD-10-CM

## 2020-01-27 DIAGNOSIS — I480 Paroxysmal atrial fibrillation: Secondary | ICD-10-CM | POA: Diagnosis not present

## 2020-01-27 DIAGNOSIS — D72819 Decreased white blood cell count, unspecified: Secondary | ICD-10-CM | POA: Diagnosis not present

## 2020-01-27 DIAGNOSIS — G43909 Migraine, unspecified, not intractable, without status migrainosus: Secondary | ICD-10-CM

## 2020-01-27 DIAGNOSIS — Z794 Long term (current) use of insulin: Secondary | ICD-10-CM

## 2020-01-27 DIAGNOSIS — R918 Other nonspecific abnormal finding of lung field: Secondary | ICD-10-CM

## 2020-01-27 DIAGNOSIS — E119 Type 2 diabetes mellitus without complications: Secondary | ICD-10-CM

## 2020-01-27 DIAGNOSIS — R079 Chest pain, unspecified: Secondary | ICD-10-CM | POA: Diagnosis not present

## 2020-01-27 LAB — GLUCOSE, CAPILLARY
Glucose-Capillary: 148 mg/dL — ABNORMAL HIGH (ref 70–99)
Glucose-Capillary: 134 mg/dL — ABNORMAL HIGH (ref 70–99)

## 2020-01-27 LAB — LIPID PANEL
Cholesterol: 164 mg/dL (ref 0–200)
HDL: 44 mg/dL (ref >40)
LDL Cholesterol: 103 mg/dL — ABNORMAL HIGH (ref 0–99)
Total CHOL/HDL Ratio: 3.7 RATIO
Triglycerides: 85 mg/dL (ref <150)
VLDL: 17 mg/dL (ref 0–40)

## 2020-01-27 LAB — T4, FREE: Free T4: 0.93 ng/dL (ref 0.61–1.12)

## 2020-01-27 LAB — SARS CORONAVIRUS 2 (TAT 6-24 HRS): SARS Coronavirus 2: NEGATIVE

## 2020-01-27 LAB — HEMOGLOBIN A1C
Hgb A1c MFr Bld: 7.9 % — ABNORMAL HIGH (ref 4.8–5.6)
Mean Plasma Glucose: 180.03 mg/dL

## 2020-01-27 LAB — TSH: TSH: 3.229 u[IU]/mL (ref 0.350–4.500)

## 2020-01-27 MED ORDER — METOPROLOL TARTRATE 5 MG/5ML IV SOLN
5.0000 mg | Freq: Four times a day (QID) | INTRAVENOUS | Status: DC | PRN
  Administered 2020-01-27: 5 mg via INTRAVENOUS
  Filled 2020-01-27: qty 5

## 2020-01-27 MED ORDER — ACETAMINOPHEN 650 MG RE SUPP: 650.0000 mg | Freq: Four times a day (QID) | RECTAL | Status: DC | PRN

## 2020-01-27 MED ORDER — IPRATROPIUM BROMIDE 0.06 % NA SOLN
1.0000 | Freq: Three times a day (TID) | NASAL | Status: DC
  Administered 2020-01-27 – 2020-01-28 (×2): 1 via NASAL
  Filled 2020-01-27: qty 15

## 2020-01-27 MED ORDER — PANTOPRAZOLE SODIUM 40 MG PO TBEC
40.0000 mg | DELAYED_RELEASE_TABLET | Freq: Two times a day (BID) | ORAL | Status: DC
  Administered 2020-01-27 – 2020-01-30 (×6): 40 mg via ORAL
  Filled 2020-01-27 (×6): qty 1

## 2020-01-27 MED ORDER — MONTELUKAST SODIUM 10 MG PO TABS
10.0000 mg | ORAL_TABLET | Freq: Every day | ORAL | Status: DC
  Administered 2020-01-27 – 2020-01-29 (×3): 10 mg via ORAL
  Filled 2020-01-27 (×3): qty 1

## 2020-01-27 MED ORDER — DILTIAZEM HCL-DEXTROSE 125-5 MG/125ML-% IV SOLN (PREMIX)
5.0000 mg/h | INTRAVENOUS | Status: DC
  Administered 2020-01-27: 5 mg/h via INTRAVENOUS

## 2020-01-27 MED ORDER — ACETAMINOPHEN 325 MG PO TABS: 650.0000 mg | ORAL_TABLET | Freq: Four times a day (QID) | ORAL | Status: DC | PRN

## 2020-01-27 MED ORDER — INSULIN ASPART 100 UNIT/ML ~~LOC~~ SOLN
0.0000 [IU] | Freq: Three times a day (TID) | SUBCUTANEOUS | Status: DC
  Administered 2020-01-28: 1 [IU] via SUBCUTANEOUS
  Administered 2020-01-28: 2 [IU] via SUBCUTANEOUS
  Administered 2020-01-28 – 2020-01-29 (×2): 1 [IU] via SUBCUTANEOUS
  Administered 2020-01-29 – 2020-01-30 (×3): 2 [IU] via SUBCUTANEOUS
  Administered 2020-01-30: 3 [IU] via SUBCUTANEOUS

## 2020-01-27 MED ORDER — ENOXAPARIN SODIUM 40 MG/0.4ML ~~LOC~~ SOLN
40.0000 mg | SUBCUTANEOUS | Status: DC
  Administered 2020-01-27: 40 mg via SUBCUTANEOUS
  Filled 2020-01-27: qty 0.4

## 2020-01-27 MED ORDER — VALACYCLOVIR HCL 500 MG PO TABS: 500.0000 mg | ORAL_TABLET | Freq: Every day | ORAL | Status: DC

## 2020-01-27 MED ORDER — CYCLOBENZAPRINE HCL 10 MG PO TABS
10.0000 mg | ORAL_TABLET | Freq: Three times a day (TID) | ORAL | Status: DC
  Administered 2020-01-27: 10 mg via ORAL
  Filled 2020-01-27 (×6): qty 1

## 2020-01-27 MED ORDER — ASPIRIN EC 81 MG PO TBEC
81.0000 mg | DELAYED_RELEASE_TABLET | Freq: Every day | ORAL | Status: DC
  Administered 2020-01-28: 81 mg via ORAL
  Filled 2020-01-27: qty 1

## 2020-01-27 MED ORDER — LORATADINE 10 MG PO TABS
10.0000 mg | ORAL_TABLET | Freq: Every day | ORAL | Status: DC
  Administered 2020-01-28 – 2020-01-30 (×3): 10 mg via ORAL
  Filled 2020-01-27 (×3): qty 1

## 2020-01-27 MED ORDER — FLUTICASONE PROPIONATE 50 MCG/ACT NA SUSP
1.0000 | Freq: Two times a day (BID) | NASAL | Status: DC
  Administered 2020-01-28 – 2020-01-30 (×3): 1 via NASAL
  Filled 2020-01-27: qty 16

## 2020-01-27 MED ORDER — DILTIAZEM HCL 25 MG/5ML IV SOLN
10.0000 mg | Freq: Once | INTRAVENOUS | Status: AC
  Administered 2020-01-27: 10 mg via INTRAVENOUS

## 2020-01-27 MED ORDER — DILTIAZEM HCL-DEXTROSE 125-5 MG/125ML-% IV SOLN (PREMIX)
INTRAVENOUS | Status: AC
  Filled 2020-01-27: qty 125

## 2020-01-27 MED ORDER — DILTIAZEM HCL 60 MG PO TABS
60.0000 mg | ORAL_TABLET | Freq: Four times a day (QID) | ORAL | Status: DC
  Administered 2020-01-27 – 2020-01-29 (×9): 60 mg via ORAL
  Filled 2020-01-27 (×10): qty 1

## 2020-01-27 NOTE — H&P (Signed)
History and Physical    Kristen Murray [id number] DOB: 09-02-1946 DOA: 01/26/2020  PCP: [name], MD   Patient coming from: Home  I have personally briefly reviewed patient's old medical records in [place]  Chief Complaint: Palpitations  HPI: Kristen Murray is a 74 y.o. female with medical history significant of IIDM, migraines on verapamil, GERD, severe iron deficiency anemia, presented with bilateral jaw pain and chest pain along with palpitations.  Symptoms started yesterday at rest, initially was bilateral dropping feeling soreness, then started to have sharp-like chest pain retrosternal, associated with short of breath and palpitations.  Episodic, worse with deep breath.  She says she had a similar symptoms of bilateral jaw pain about 2 weeks ago came to [facility] ED, at that point she also had a sore throat and some cough, she was given antibiotics and sent home.  This time yesterday she denies any fever chills, no sore throat no cough.  She claims she has been losing weight for the last 3 months about 5 pounds, and any nausea vomit, she was diagnosed with GERD and started PPI last week.  Patient also was found to have leukopenia since late last year, and iron deficiency anemia, for which she received 5 iron infusion in the oncologist office this year.  She denied any abdominal pain, no history of melena, she never had EGD or colonoscopy. ED Course: At [facility] ED, EKG showed a flutter 2-1 transduction, Cardizem drip started, poorly controlled overnight, received Lopressor IV pushes.  Review of Systems: As per HPI otherwise 10 point review of systems negative.    Past Medical History:  Diagnosis Date  . Arthritis   . Borderline abnormal TFTs    as a teen  . Diabetes mellitus without complication ([place])   . E. coli UTI 10/05/12   [facility]; resistant only to Septra DS & tetracycline  . Eye infection   . Fibromyalgia   . GERD (gastroesophageal reflux disease)   .  Headache(784.0)   . 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 HSV-1 infection   . HTN (hypertension)   . Hyperglycemia   . Hyperlipidemia   . Meningioma ([place])   . Nephrolithiasis    Dr Amalia Hailey, [facility], Hx of [3][  . Polyp of colon   . Radiculopathy     Past Surgical History:  Procedure Laterality Date  . ABDOMINAL HYSTERECTOMY     TAH/BSO --fibroids, no cancer  . BALLOON DILATION N/A 07/20/2014   Procedure: BALLOON DILATION;  Surgeon: Garlan Fair, MD;  Location: [facility];  Service: Endoscopy;  Laterality: N/A;  . BREAST BIOPSY     Right  . CARPAL TUNNEL RELEASE     & trigger thumb RUE; Dr Alphonzo Cruise  . COLONOSCOPY W/ POLYPECTOMY  07/2004   Neg 2011; Dr Earle Gell  . CYSTOSCOPY  11/2009   Neg  . ESOPHAGOGASTRODUODENOSCOPY N/A 07/20/2014   Procedure: ESOPHAGOGASTRODUODENOSCOPY (EGD);  Surgeon: Garlan Fair, MD;  Location: [facility];  Service: Endoscopy;  Laterality: N/A;  . FLEXIBLE BRONCHOSCOPY W/ UPPER ENDOSCOPY     neg  . KNEE ARTHROSCOPY     Left  . ROTATOR CUFF REPAIR     R shoulder  . TONSILLECTOMY    . TRIGGER FINGER RELEASE Right    Right Thumb     reports that she has never smoked. She has never used smokeless tobacco. She reports that she does not drink alcohol or use drugs.  Allergies  Allergen Reactions  . Codeine     Hives Because of a history of documented adverse serious drug reaction;Medi Alert bracelet  is recommended  . Propoxyphene Rash  . Propoxyphene Hcl Swelling and Rash    rash  . Latex     rash  . Macrodantin [Nitrofurantoin Macrocrystal] Other (See Comments)    Numbness, aching all over,   . Other     Pine nuts & walnuts throat congestion. No documented angioedema.  . Tizanidine Other (See Comments)  . Ciprofloxacin     ? Nausea (Note: she takes this when on mission trips)  . Tramadol     ? nausea    Family History  Problem Relation Age of Onset  . Alcohol abuse Mother   . Diabetes  Maternal Aunt   . Diabetes Maternal Uncle   . Diabetes Maternal Grandmother   . Tuberculosis Maternal Uncle   . Other Father        unknown  . Coronary artery disease Neg Hx      Prior to Admission medications   Medication Sig Start Date End Date Taking? Authorizing Provider  amoxicillin (AMOXIL) 500 MG capsule Take 1 capsule (500 mg total) by mouth 3 (three) times daily. 01/10/20   Isla Pence, MD  cyclobenzaprine (FLEXERIL) 10 MG tablet 10 mg daily as needed.    [provider]  fluorometholone (FML) 0.1 % ophthalmic suspension Place 1 drop into both eyes 3 (three) times daily.    [provider]  fluticasone (FLONASE) 50 MCG/ACT nasal spray Place 1 spray into both nostrils 2 (two) times daily. 04/01/19   Kozlow, Donnamarie Poag, MD  glucose blood (FREESTYLE LITE) test strip CHECK BLOOD SUGAR ONCE DAILY AS DIRECTED.  DX:790.29 01/09/14   Hendricks Limes, MD  ipratropium (ATROVENT) 0.06 % nasal spray Place 1-2 sprays into both nostrils every 6 (six) hours as needed for rhinitis. 09/30/19   Kozlow, Donnamarie Poag, MD  Lancets (FREESTYLE) lancets Check blood sugar once daily as directed DX:790.29 (FREESTYLE FREEDOM LITE) 01/09/14   Hendricks Limes, MD  loratadine (CLARITIN) 10 MG tablet Take 1 tablet (10 mg total) by mouth daily. 01/10/20   Isla Pence, MD  montelukast (SINGULAIR) 10 MG tablet Take 1 tablet (10 mg total) by mouth at bedtime. 09/30/19   Kozlow, Donnamarie Poag, MD  Multiple Vitamin (MULTIVITAMIN) tablet Take 1 tablet by mouth daily.      [provider]  pantoprazole (PROTONIX) 40 MG tablet Take 40 mg by mouth 2 (two) times daily. 01/23/20   [provider]  rosuvastatin (CRESTOR) 5 MG tablet  11/20/19   [provider]  SITagliptin Phosphate (JANUVIA PO) Take by mouth.    [provider]  trimethoprim (TRIMPEX) 100 MG tablet Take 100 mg by mouth daily.    [provider]  valACYclovir (VALTREX) 500 MG tablet Take 1 tablet (500 mg total)  by mouth 2 (two) times daily. For one week then one pill daily for 6 months 07/08/19   Fontaine, Belinda Block, MD  verapamil (CALAN-SR) 180 MG CR tablet 180 mg at bedtime.  01/02/17   [provider]    Physical Exam: Vitals:   01/27/20 1430 01/27/20 1500 01/27/20 1530 01/27/20 1726  BP: (!) 144/76 (!) 147/87 (!) 157/117 (!) 149/73  Pulse: 89 (!) 103 96 90  Resp: 15 (!) 23 (!) 22 20  Temp:    98.8 F (37.1 C)  TempSrc:    Oral  SpO2: 96% 100% 98%  98%  Weight:    74.9 kg  Height:    5\' 5"  (1.651 m)    Constitutional: NAD, calm, comfortable Vitals:   01/27/20 1430 01/27/20 1500 01/27/20 1530 01/27/20 1726  BP: (!) 144/76 (!) 147/87 (!) 157/117 (!) 149/73  Pulse: 89 (!) 103 96 90  Resp: 15 (!) 23 (!) 22 20  Temp:    98.8 F (37.1 C)  TempSrc:    Oral  SpO2: 96% 100% 98% 98%  Weight:    74.9 kg  Height:    5\' 5"  (1.651 m)   Eyes: PERRL, lids and conjunctivae normal ENMT: Mucous membranes are moist. Posterior pharynx clear of any exudate or lesions.Normal dentition.  Neck: normal, supple, no masses, no thyromegaly Respiratory: clear to auscultation bilaterally, no wheezing, no crackles. Normal respiratory effort. No accessory muscle use.  Cardiovascular: Regular rate and rhythm, no murmurs / rubs / gallops. No extremity edema. 2+ pedal pulses. No carotid bruits.  Abdomen: no tenderness, no masses palpated. No hepatosplenomegaly. Bowel sounds positive.  Musculoskeletal: no clubbing / cyanosis. No joint deformity upper and lower extremities. Good ROM, no contractures. Normal muscle tone.  Skin: no rashes, lesions, ulcers. No induration Neurologic: CN 2-12 grossly intact. Sensation intact, DTR normal. Strength 5/5 in all 4.  Psychiatric: Normal judgment and insight. Alert and oriented x 3. Normal mood.     Labs on Admission: I have personally reviewed following labs and imaging studies  CBC: Recent Labs  Lab 01/26/20 1826  WBC 3.5*  HGB 12.4  HCT 37.9  MCV 87.1  PLT  [id number]   Basic Metabolic Panel: Recent Labs  Lab 01/26/20 1826  NA 138  K 4.4  CL 102  CO2 27  GLUCOSE 209*  BUN 17  CREATININE 0.84  CALCIUM 9.3  MG 2.1   GFR: Estimated Creatinine Clearance: 59.6 mL/min (by C-G formula based on SCr of 0.84 mg/dL). Liver Function Tests: No results for input(s): AST, ALT, ALKPHOS, BILITOT, PROT, ALBUMIN in the last 168 hours. No results for input(s): LIPASE, AMYLASE in the last 168 hours. No results for input(s): AMMONIA in the last 168 hours. Coagulation Profile: No results for input(s): INR, PROTIME in the last 168 hours. Cardiac Enzymes: No results for input(s): CKTOTAL, CKMB, CKMBINDEX, TROPONINI in the last 168 hours. BNP (last 3 results) No results for input(s): PROBNP in the last 8760 hours. HbA1C: No results for input(s): HGBA1C in the last 72 hours. CBG: Recent Labs  Lab 01/27/20 1815  GLUCAP 148*   Lipid Profile: No results for input(s): CHOL, HDL, LDLCALC, TRIG, CHOLHDL, LDLDIRECT in the last 72 hours. Thyroid Function Tests: No results for input(s): TSH, T4TOTAL, FREET4, T3FREE, THYROIDAB in the last 72 hours. Anemia Panel: No results for input(s): VITAMINB12, FOLATE, FERRITIN, TIBC, IRON, RETICCTPCT in the last 72 hours. Urine analysis:    Component Value Date/Time   COLORURINE YELLOW 07/21/2018 1925   APPEARANCEUR CLEAR 07/21/2018 1925   LABSPEC 1.020 07/21/2018 1925   PHURINE 6.0 07/21/2018 1925   GLUCOSEU NEGATIVE 07/21/2018 1925   HGBUR TRACE (A) 07/21/2018 1925   HGBUR negative 09/19/2010 1125   BILIRUBINUR NEGATIVE 07/21/2018 1925   BILIRUBINUR Neg 12/06/2012 1455   KETONESUR NEGATIVE 07/21/2018 1925   PROTEINUR NEGATIVE 07/21/2018 1925   UROBILINOGEN 0.2 12/06/2012 1455   UROBILINOGEN 0.2 10/16/2012 1718   NITRITE NEGATIVE 07/21/2018 1925   LEUKOCYTESUR SMALL (A) 07/21/2018 1925    Radiological Exams on Admission: DG Chest 2 View  Result Date: 01/26/2020 CLINICAL DATA:  Chest  pain EXAM: CHEST - 2 VIEW  COMPARISON:  01/10/2020 FINDINGS: Lungs are clear.  No pleural effusion or pneumothorax. The heart is normal in size. Visualized osseous structures are within normal limits. IMPRESSION: Normal chest radiographs. Electronically Signed   By: Julian Hy M.D.   On: 01/26/2020 19:12   CT ANGIO CHEST PE W OR WO CONTRAST  Result Date: 01/26/2020 CLINICAL DATA:  Chest pain. EXAM: CT ANGIOGRAPHY CHEST WITH CONTRAST TECHNIQUE: Multidetector CT imaging of the chest was performed using the standard protocol during bolus administration of intravenous contrast. Multiplanar CT image reconstructions and MIPs were obtained to evaluate the vascular anatomy. CONTRAST:  175mL OMNIPAQUE IOHEXOL 350 MG/ML SOLN COMPARISON:  None. FINDINGS: Cardiovascular: Contrast injection is sufficient to demonstrate satisfactory opacification of the pulmonary arteries to the segmental level. There is no pulmonary embolus. The main pulmonary artery is within normal limits for size. The heart size is normal. There is no significant pericardial effusion. There is no evidence for thoracic aortic dissection or aneurysm. The arch vessels are grossly patent. The left vertebral artery arises directly from the aortic arch. Mediastinum/Nodes: --No mediastinal or hilar lymphadenopathy. --No axillary lymphadenopathy. --No supraclavicular lymphadenopathy. --the patient is status post left hemithyroidectomy. There is a right-sided thyroid nodule measuring approximately 1.6 cm (axial series 4, image 3). --The esophagus is unremarkable Lungs/Pleura: There are innumerable small pulmonary nodules bilaterally. For example there is a 3 mm pulmonary nodule in the right lower lobe (axial series 5, image 56). There are 2-3 mm pulmonary nodules in the posterior left lower lobe (axial series 5, image 35). There is no pneumothorax or large pleural effusion. There is no focal infiltrate. Upper Abdomen: There are multiple hypoattenuating structures throughout the liver  favored to represent hepatic cysts, however many of them are too small to characterize on this study. There is no acute abnormality detected within the upper abdomen. Musculoskeletal: No chest wall abnormality. No acute or significant osseous findings. Review of the MIP images confirms the above findings. IMPRESSION: 1. No acute pulmonary embolism. 2. No evidence for thoracic aortic dissection. 3. There are innumerable small pulmonary nodules bilaterally measuring less than 6 mm. No follow-up needed if patient is low-risk (and has no known or suspected primary neoplasm). Non-contrast chest CT can be considered in 12 months if patient is high-risk. This recommendation follows the consensus statement: Guidelines for Management of Incidental Pulmonary Nodules Detected on CT Images: From the Fleischner Society 2017; Radiology 2017; 284:228-243. 4. Status post left-sided hemithyroidectomy. There is a right-sided thyroid nodule measuring approximately 1.6 cm. Recommend thyroid US(Ref: J Am Coll Radiol. 2015 Feb;12(2): 143-50). 5. Multiple small hypoattenuating structures are noted in the patient's liver. Some of these are too small to characterize but are still favored to represent hepatic cysts. Consider further evaluation with a nonemergent outpatient ultrasound. Electronically Signed   By: Constance Holster M.D.   On: 01/26/2020 19:49    EKG: Independently reviewed.  Normal sinus rhythm  Assessment/Plan Principal Problem:   Atrial flutter, paroxysmal (HCC) Active Problems:   Chest pain at rest   A-fib [facility])  Chest pain Likely from uncontrolled A. Fib, some features are pleural like such as worsening with deep breath, she had a viral URI like process about 2 weeks ago, will check echo to rule out pericarditis. Aspirin, will check lipid panel PE and dissection ruled out  Paroxysmal A. Fib Back in sinus rhythm.  Discussed with on-call cardiologist, Dr. Davina Poke, recommend switch verapamil (for migraines)  to Cardizem Regarding systemic anticoagulation,  her age and history of diabetes, cardiology recommend systemic anticoagulation however, given the patient has severe iron deficient anemia, without ever having a formal GI work-up including endoscopy or colonoscopy, not able to stratify her GI bleed risk in this case, will start her on aspirin for now.  Recommend her to see outpatient GI for endoscopy and colonoscopy then start systemic anticoagulation if those studies negative.  Thyroid nodules Accidental finding, on the right side of her thyroid, regarding the CT also showing status post left hemithyroidectomy, patient insisted she never had thyroid surgery before. Given her age bracket, strongly recommend she have a thyroid fine-needle biopsy as outpatient. Thyroid ultrasound ordered TSH, T4 and T3 sent.  Leukopenia Repeat CBC, outpatient oncology follow-up  Iron deficiency anemia Suspect GI source, she is rather asymptomatic, recommend outpatient GI work-up  Migraines/jaw pain Like this has been a chronic issue, she had temporal artery biopsy 2 years ago, which was negative.  As mentioned above we will switch verapamil to Cardizem.  IIDM Sliding scale for now  Lung nodules Periodic CT follow-up   DVT prophylaxis: Heparin subcu Code Status: Full code Family Communication: None at bedside Disposition Plan: Likely can discharge home tomorrow if heart rate remains controlled and echo done Consults called: Cardiologist Dr. Gus Height Admission status: Telemetry opts   Lequita Halt MD [facility] Pager [phone]  If 7PM-7AM, please contact night-coverage [email or web address] Password [facility]  01/27/2020, 6:38 PM

## 2020-01-27 NOTE — ED Provider Notes (Addendum)
Patient is having recurrent A. fib.  Heart rate has now spiked up into the 140-170 range.  Feels a little fluttering in her chest but is otherwise stable.  Will give the as needed metoprolol.  She is currently waiting on her bed.   EKG Interpretation  Date/Time:  Tuesday January 27 2020 10:10:03 EDT Ventricular Rate:  171 PR Interval:  136 QRS Duration: 76 QT Interval:  257 QTC Calculation: 434 R Axis:   96 Text Interpretation: Atrial fibrillation with rapid V-rate Right axis deviation Abnormal lateral Q waves Repolarization abnormality, prob rate related Confirmed by Sherwood Gambler [phone]) on 01/27/2020 10:14:56 AM       Patient is back in sinus rhythm. Seems to fluctuate b/w afib and NSR. Otherwise, is stable, waiting on admission   Sherwood Gambler, MD 01/27/20 1039  Patient is back in flutter. Still has good BP. Will place on cardizem drip with bolus. Seems to go in and out of it.   EKG Interpretation  Date/Time:  Tuesday January 27 2020 10:42:05 EDT Ventricular Rate:  142 PR Interval:  136 QRS Duration: 87 QT Interval:  326 QTC Calculation: 502 R Axis:   92 Text Interpretation: Atrial flutter with predominant 2:1 AV block Right axis deviation Prolonged QT interval Confirmed by Sherwood Gambler [phone]) on 01/27/2020 10:46:23 AM       HR stable since cardizem. Based on how long she has been here (waiting on bed 17+hours), per protocol will transfer to [facility] ED for admission/consults/definitive care. D/w Dr. Tyrone Nine.  CRITICAL CARE Performed by: Ephraim Hamburger   Total critical care time: 30 minutes  Critical care time was exclusive of separately billable procedures and treating other patients.  Critical care was necessary to treat or prevent imminent or life-threatening deterioration.  Critical care was time spent personally by me on the following activities: development of treatment plan with patient and/or surrogate as well as nursing, discussions with consultants,  evaluation of patient's response to treatment, examination of patient, obtaining history from patient or surrogate, ordering and performing treatments and interventions, ordering and review of laboratory studies, ordering and review of radiographic studies, pulse oximetry and re-evaluation of patient's condition.    Sherwood Gambler, MD 01/27/20 1504

## 2020-01-27 NOTE — ED Notes (Signed)
Attempted to call report; contact info for this RN provided for callback.

## 2020-01-27 NOTE — ED Notes (Signed)
Assisted pt to use [facility]

## 2020-01-28 ENCOUNTER — Observation Stay (HOSPITAL_COMMUNITY): Payer: Medicare Other

## 2020-01-28 DIAGNOSIS — I209 Angina pectoris, unspecified: Secondary | ICD-10-CM

## 2020-01-28 DIAGNOSIS — I4892 Unspecified atrial flutter: Secondary | ICD-10-CM | POA: Diagnosis not present

## 2020-01-28 DIAGNOSIS — I480 Paroxysmal atrial fibrillation: Secondary | ICD-10-CM | POA: Diagnosis not present

## 2020-01-28 DIAGNOSIS — R079 Chest pain, unspecified: Secondary | ICD-10-CM

## 2020-01-28 LAB — CBC
HCT: 35.3 % — ABNORMAL LOW (ref 36.0–46.0)
Hemoglobin: 11.2 g/dL — ABNORMAL LOW (ref 12.0–15.0)
MCH: 27.8 pg (ref 26.0–34.0)
MCHC: 31.7 g/dL (ref 30.0–36.0)
MCV: 87.6 fL (ref 80.0–100.0)
Platelets: 194 10*3/uL (ref 150–400)
RBC: 4.03 MIL/uL (ref 3.87–5.11)
RDW: 17.2 % — ABNORMAL HIGH (ref 11.5–15.5)
WBC: 2.8 10*3/uL — ABNORMAL LOW (ref 4.0–10.5)
nRBC: 0 % (ref 0.0–0.2)

## 2020-01-28 LAB — BASIC METABOLIC PANEL
Anion gap: 7 (ref 5–15)
BUN: 13 mg/dL (ref 8–23)
CO2: 25 mmol/L (ref 22–32)
Calcium: 9 mg/dL (ref 8.9–10.3)
Chloride: 101 mmol/L (ref 98–111)
Creatinine, Ser: 0.62 mg/dL (ref 0.44–1.00)
GFR calc Af Amer: >60 mL/min (ref >60)
GFR calc non Af Amer: >60 mL/min (ref >60)
Glucose, Bld: 182 mg/dL — ABNORMAL HIGH (ref 70–99)
Potassium: 3.8 mmol/L (ref 3.5–5.1)
Sodium: 133 mmol/L — ABNORMAL LOW (ref 135–145)

## 2020-01-28 LAB — ECHOCARDIOGRAM COMPLETE
Height: 65 in
Weight: 2654.4 oz

## 2020-01-28 LAB — T3, FREE: T3, Free: 2.6 pg/mL (ref 2.0–4.4)

## 2020-01-28 LAB — GLUCOSE, CAPILLARY
Glucose-Capillary: 147 mg/dL — ABNORMAL HIGH (ref 70–99)
Glucose-Capillary: 181 mg/dL — ABNORMAL HIGH (ref 70–99)
Glucose-Capillary: 145 mg/dL — ABNORMAL HIGH (ref 70–99)
Glucose-Capillary: 205 mg/dL — ABNORMAL HIGH (ref 70–99)

## 2020-01-28 MED ORDER — ASPIRIN EC 81 MG PO TBEC
81.0000 mg | DELAYED_RELEASE_TABLET | Freq: Every day | ORAL | Status: DC
  Administered 2020-01-29 – 2020-01-30 (×2): 81 mg via ORAL
  Filled 2020-01-28 (×2): qty 1

## 2020-01-28 MED ORDER — HEPARIN (PORCINE) 25000 UT/250ML-% IV SOLN
850.0000 [IU]/h | INTRAVENOUS | Status: DC
  Administered 2020-01-28: 15:00:00 1100 [IU]/h via INTRAVENOUS
  Administered 2020-01-29: 950 [IU]/h via INTRAVENOUS
  Filled 2020-01-28 (×2): qty 250

## 2020-01-28 MED ORDER — APIXABAN 5 MG PO TABS: 5.0000 mg | ORAL_TABLET | Freq: Two times a day (BID) | ORAL | Status: DC

## 2020-01-28 MED ORDER — METOPROLOL TARTRATE 25 MG PO TABS
25.0000 mg | ORAL_TABLET | Freq: Two times a day (BID) | ORAL | Status: DC
  Administered 2020-01-28 – 2020-01-29 (×3): 25 mg via ORAL
  Filled 2020-01-28 (×3): qty 1

## 2020-01-28 MED ORDER — HEPARIN BOLUS VIA INFUSION
3000.0000 [IU] | Freq: Once | INTRAVENOUS | Status: AC
  Administered 2020-01-28: 3000 [IU] via INTRAVENOUS
  Filled 2020-01-28: qty 3000

## 2020-01-28 NOTE — Progress Notes (Addendum)
PROGRESS NOTE  Kristen Murray [id number] DOB: 1946-08-02 DOA: 01/26/2020 PCP: [name], MD   LOS: 0 days   Brief narrative: As per HPI,  Kristen Murray is a 74 y.o. female with medical history significant of IIDM, migraines on verapamil, GERD, severe iron deficiency anemia, presented with bilateral jaw pain and chest pain along with palpitations.  Symptoms started yesterday at rest, initially was bilateral dropping feeling soreness, then started to have sharp-like chest pain retrosternal, associated with short of breath and palpitations.  Episodic, worse with deep breath.  She says she had a similar symptoms of bilateral jaw pain about 2 weeks ago came to [facility] ED, at that point she also had a sore throat and some cough, she was given antibiotics and sent home.  This time yesterday she denies any fever chills, no sore throat no cough.  She claims she has been losing weight for the last 3 months about 5 pounds, and any nausea vomit, she was diagnosed with GERD and started PPI last week.  Patient also was found to have leukopenia since late last year, and iron deficiency anemia, for which she received 5 iron infusion in the oncologist office this year.  She denied any abdominal pain, no history of melena, she never had EGD or colonoscopy. ED Course: At [facility] ED, EKG showed a flutter 2-1 transduction, Cardizem drip started, poorly controlled overnight, received Lopressor IV pushes.  Assessment/Plan:  Principal Problem:   Atrial flutter, paroxysmal (HCC) Active Problems:   Chest pain at rest   A-fib [facility])  Chest pain Likely from uncontrolled A. Fib/flutter.  CT angiogram was performed which showed no evidence of pulmonary embolism but some pulmonary nodules bilaterally.  Likely on heparin drip.  Cardiology recommending CT coronary.  Paroxysmal A. Fib/atrial flutter with rapid ventricular response.  Spoke with cardiology for consultation.  Currently on aspirin Cardizem 60 every 6  and heparin drip as per cardiology.  Toprol has been added to the regimen as well.  Will closely monitor overnight.  Patient will need long-term anticoagulation on discharge.CHA2DS2-VASc Score of at least 3.   Thyroid nodules Incidental finding on the CT scan.  History of left hemithyroidectomy.  Ultrasound of the thyroid showed stable scattered right nodules and cyst.  No indication for biopsy.  TSH, free T3 and T4 was within normal limits  Leukopenia Mild.  We will continue to monitor.  No thrombocytopenia.  Will need outpatient follow-up.  Migraines/jaw pain Like this has been a chronic issue, she had temporal artery biopsy 2 years ago, which was negative.  Verapamil has been changed to Cardizem.  Diabetes mellitus type II. Hemoglobin A1c was 7.9.  Continue sliding scale insulin, Accu-Cheks diabetic diet.  Lung nodules Periodic CT follow-up as outpatient.  VTE Prophylaxis: Heparin subcu  Code Status: Full code  Family Communication: None  Disposition Plan:  . Patient is from home . Likely disposition to home likely in 1 to 2 days . Barriers to discharge: Atrial flutter, symptomatic, cardiology on board titrating medication, heparin drip.  Will need PT for ambulation when her rhythm stabilizes, CT coronaries ordered by cardiology.   Consultants:  Cardiology  Procedures:  None  Antibiotics:  . None  Anti-infectives (From admission, onward)   Start     Dose/Rate Route Frequency Ordered Stop   01/27/20 1845  valACYclovir (VALTREX) tablet 500 mg  Status:  Discontinued     500 mg Oral Daily 01/27/20 1844 01/27/20 1952      Subjective: Today,  patient was seen and examined at bedside.  Patient complains of of mild chest discomfort and palpitations.  Nursing staff reported atrial flutter at 150-160 bpm.  Denies dyspnea cough fever  Objective: Vitals:   01/28/20 1118 01/28/20 1223  BP: 128/68 140/61  Pulse: 94   Resp:    Temp: 98.4 F (36.9 C)   SpO2: 98%      Intake/Output Summary (Last 24 hours) at 01/28/2020 1452 Last data filed at 01/27/2020 1709 Gross per 24 hour  Intake 40.93 ml  Output --  Net 40.93 ml   Filed Weights   01/26/20 1803 01/27/20 1726 01/28/20 0513  Weight: 71.7 kg 74.9 kg 75.3 kg   Body mass index is 27.61 kg/m.   Physical Exam: GENERAL: Patient is alert awake and oriented. Not in obvious distress. HENT: No scleral pallor or icterus. Pupils equally reactive to light. Oral mucosa is moist NECK: is supple, no gross swelling noted. CHEST: Clear to auscultation. No crackles or wheezes.  Diminished breath sounds bilaterally. CVS: S1 and S2 heard, systolic murmur regular rate and rhythm.  Tachycardia noted ABDOMEN: Soft, non-tender, bowel sounds are present. EXTREMITIES: No edema. CNS: Cranial nerves are intact. No focal motor deficits. SKIN: warm and dry without rashes.  Data Review: I have personally reviewed the following laboratory data and studies,  CBC: Recent Labs  Lab 01/26/20 1826 01/28/20 0251  WBC 3.5* 2.8*  HGB 12.4 11.2*  HCT 37.9 35.3*  MCV 87.1 87.6  PLT 194 [id number]   Basic Metabolic Panel: Recent Labs  Lab 01/26/20 1826 01/28/20 0251  NA 138 133*  K 4.4 3.8  CL 102 101  CO2 27 25  GLUCOSE 209* 182*  BUN 17 13  CREATININE 0.84 0.62  CALCIUM 9.3 9.0  MG 2.1  --    Liver Function Tests: No results for input(s): AST, ALT, ALKPHOS, BILITOT, PROT, ALBUMIN in the last 168 hours. No results for input(s): LIPASE, AMYLASE in the last 168 hours. No results for input(s): AMMONIA in the last 168 hours. Cardiac Enzymes: No results for input(s): CKTOTAL, CKMB, CKMBINDEX, TROPONINI in the last 168 hours. BNP (last 3 results) No results for input(s): BNP in the last 8760 hours.  ProBNP (last 3 results) No results for input(s): PROBNP in the last 8760 hours.  CBG: Recent Labs  Lab 01/27/20 1815 01/27/20 2101 01/28/20 0745 01/28/20 1115  GLUCAP 148* 134* 147* 181*   Recent Results (from  the past 240 hour(s))  SARS CORONAVIRUS 2 (TAT 6-24 HRS) Nasopharyngeal Nasopharyngeal Swab     Status: None   Collection Time: 01/26/20  9:40 PM   Specimen: Nasopharyngeal Swab  Result Value Ref Range Status   SARS Coronavirus 2 NEGATIVE NEGATIVE Final    Comment: (NOTE) SARS-CoV-2 target nucleic acids are NOT DETECTED. The SARS-CoV-2 RNA is generally detectable in upper and lower respiratory specimens during the acute phase of infection. Negative results do not preclude SARS-CoV-2 infection, do not rule out co-infections with other pathogens, and should not be used as the sole basis for treatment or other patient management decisions. Negative results must be combined with clinical observations, patient history, and epidemiological information. The expected result is Negative. Fact Sheet for Patients: SugarRoll.be Fact Sheet for Healthcare Providers: https://www.woods-mathews.com/ This test is not yet approved or cleared by the [place] FDA and  has been authorized for detection and/or diagnosis of SARS-CoV-2 by FDA under an Emergency Use Authorization (EUA). This EUA will remain  in effect (meaning this test can be used)  for the duration of the COVID-19 declaration under Section 56 4(b)(1) of the Act, 21 U.S.C. section 360bbb-3(b)(1), unless the authorization is terminated or revoked sooner. Performed at [facility], [place] [address] [place], [place] [address]      Studies: DG Chest 2 View  Result Date: 01/26/2020 CLINICAL DATA:  Chest pain EXAM: CHEST - 2 VIEW COMPARISON:  01/10/2020 FINDINGS: Lungs are clear.  No pleural effusion or pneumothorax. The heart is normal in size. Visualized osseous structures are within normal limits. IMPRESSION: Normal chest radiographs. Electronically Signed   By: Julian Hy M.D.   On: 01/26/2020 19:12   CT ANGIO CHEST PE W OR WO CONTRAST  Result Date: 01/26/2020 CLINICAL DATA:  Chest  pain. EXAM: CT ANGIOGRAPHY CHEST WITH CONTRAST TECHNIQUE: Multidetector CT imaging of the chest was performed using the standard protocol during bolus administration of intravenous contrast. Multiplanar CT image reconstructions and MIPs were obtained to evaluate the vascular anatomy. CONTRAST:  131mL OMNIPAQUE IOHEXOL 350 MG/ML SOLN COMPARISON:  None. FINDINGS: Cardiovascular: Contrast injection is sufficient to demonstrate satisfactory opacification of the pulmonary arteries to the segmental level. There is no pulmonary embolus. The main pulmonary artery is within normal limits for size. The heart size is normal. There is no significant pericardial effusion. There is no evidence for thoracic aortic dissection or aneurysm. The arch vessels are grossly patent. The left vertebral artery arises directly from the aortic arch. Mediastinum/Nodes: --No mediastinal or hilar lymphadenopathy. --No axillary lymphadenopathy. --No supraclavicular lymphadenopathy. --the patient is status post left hemithyroidectomy. There is a right-sided thyroid nodule measuring approximately 1.6 cm (axial series 4, image 3). --The esophagus is unremarkable Lungs/Pleura: There are innumerable small pulmonary nodules bilaterally. For example there is a 3 mm pulmonary nodule in the right lower lobe (axial series 5, image 56). There are 2-3 mm pulmonary nodules in the posterior left lower lobe (axial series 5, image 35). There is no pneumothorax or large pleural effusion. There is no focal infiltrate. Upper Abdomen: There are multiple hypoattenuating structures throughout the liver favored to represent hepatic cysts, however many of them are too small to characterize on this study. There is no acute abnormality detected within the upper abdomen. Musculoskeletal: No chest wall abnormality. No acute or significant osseous findings. Review of the MIP images confirms the above findings. IMPRESSION: 1. No acute pulmonary embolism. 2. No evidence for  thoracic aortic dissection. 3. There are innumerable small pulmonary nodules bilaterally measuring less than 6 mm. No follow-up needed if patient is low-risk (and has no known or suspected primary neoplasm). Non-contrast chest CT can be considered in 12 months if patient is high-risk. This recommendation follows the consensus statement: Guidelines for Management of Incidental Pulmonary Nodules Detected on CT Images: From the Fleischner Society 2017; Radiology 2017; 284:228-243. 4. Status post left-sided hemithyroidectomy. There is a right-sided thyroid nodule measuring approximately 1.6 cm. Recommend thyroid US(Ref: J Am Coll Radiol. 2015 Feb;12(2): 143-50). 5. Multiple small hypoattenuating structures are noted in the patient's liver. Some of these are too small to characterize but are still favored to represent hepatic cysts. Consider further evaluation with a nonemergent outpatient ultrasound. Electronically Signed   By: Constance Holster M.D.   On: 01/26/2020 19:49   US THYROID  Result Date: 01/28/2020 CLINICAL DATA:  Nodule.  Previous hemithyroidectomy. EXAM: THYROID ULTRASOUND TECHNIQUE: Ultrasound examination of the thyroid gland and adjacent soft tissues was performed. COMPARISON:  08/19/2013 FINDINGS: Parenchymal Echotexture: Mildly heterogenous Isthmus: 0.1 cm thickness Right lobe: 4.9 x 1.6  x 2 cm, previously 4.8 x 1.7 x 1.7 Left lobe: Surgically absent _________________________________________________________ Estimated total number of nodules >/= 1 cm: 0 Number of spongiform nodules >/=  2 cm not described below (TR1): 0 Number of mixed cystic and solid nodules >/= 1.5 cm not described below ([place]): 0 _________________________________________________________ Nodule # 1: 0.9 cm hyperechoic, inferior right, previously 1.1 cm; stability for greater than 5 years implies benignity; This nodule does NOT meet TI-RADS criteria for biopsy or dedicated follow-up. In Nodule # 2: 0.8 cm hypoechoic nodule without  calcifications, superior right; This nodule does NOT meet TI-RADS criteria for biopsy or dedicated follow-up. Additional scattered subcentimeter benign colloid cysts and complex cysts which do not merit follow-up. IMPRESSION: 1. No residual/recurrent tissue post left hemithyroidectomy. 2. Stable scattered small right nodules and cysts. No indication for biopsy or dedicated imaging follow-up. The above is in keeping with the ACR TI-RADS recommendations - J Am Coll Radiol 2017;14:587-595. Electronically Signed   By: Lucrezia Europe M.D.   On: 01/28/2020 07:35      Flora Lipps, MD  [facility] 01/28/2020

## 2020-01-28 NOTE — Progress Notes (Signed)
[place] for heparin Indication: atrial fibrillation  Allergies  Allergen Reactions  . Codeine Hives and Other (See Comments)    Because of a history of documented adverse serious drug reaction, Medi Alert bracelet  is recommended  . Macrodantin [Nitrofurantoin Macrocrystal] Other (See Comments)    Numbness, aching all over  . Other Shortness Of Breath, Rash and Other (See Comments)    Pine nuts & walnuts throat congestion. No documented angioedema. Avocado- Rash  . Propoxyphene Hives, Swelling and Rash  . Propoxyphene Hcl Hives, Swelling and Rash  . Latex Rash  . Tizanidine Other (See Comments)    Reaction not recalled  . Banana Rash  . 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 Tramadol Nausea Only    Patient Measurements: Height: 5\' 5"  (165.1 cm) Weight: 165 lb 14.4 oz (75.3 kg) IBW/kg (Calculated) : 57 Heparin Dosing Weight: 75kg  Vital Signs: Temp: 98.4 F (36.9 C) (03/31 1118) Temp Source: Oral (03/31 1118) BP: 140/61 (03/31 1223) Pulse Rate: 94 (03/31 1118)  Labs: Recent Labs    01/26/20 1826 01/26/20 2050 01/28/20 0251  HGB 12.4  --  11.2*  HCT 37.9  --  35.3*  PLT 194  --  194  CREATININE 0.84  --  0.62  TROPONINIHS 6 6  --     Estimated Creatinine Clearance: 62.6 mL/min (by C-G formula based on SCr of 0.62 mg/dL).   Medical History: Past Medical History:  Diagnosis Date  . Arthritis   . Borderline abnormal TFTs    as a teen  . Diabetes mellitus without complication ([place])   . E. coli UTI 10/05/12   [facility]; resistant only to Septra DS & tetracycline  . Eye infection   . Fibromyalgia   . GERD (gastroesophageal reflux disease)   . Headache(784.0)   . 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 HSV-1 infection   . HTN (hypertension)   . Hyperglycemia   . Hyperlipidemia   . Meningioma ([place])    . Nephrolithiasis    Dr Amalia Hailey, [facility], Hx of [3][  . Polyp of colon   . Radiculopathy      Assessment: 68 yoF admitted with palpitations found to have new AFL. Pharmacy asked to start IV heparin, CHADSVASc 3. No AC PTA, hx of IDA but Hgb relatively normal at 11.2. Enoxaparin prophylactic dose given ~18h ago.  Goal of Therapy:  Heparin level 0.3-0.7 units/ml Monitor platelets by anticoagulation protocol: Yes   Plan:  -Heparin 3000 units x1 then 1100 units/h -Check 8hr heparin level -Daily heparin level and CBC  Arrie Senate, PharmD, BCPS Clinical Pharmacist [phone] Please check AMION for all [facility] numbers 01/28/2020

## 2020-01-28 NOTE — Progress Notes (Signed)
  Echocardiogram 2D Echocardiogram has been performed.  Kristen Murray 01/28/2020, 3:04 PM

## 2020-01-28 NOTE — Progress Notes (Signed)
Patient's heart rate increased and sustained in the 150-170's, complained of chest and jaw pain. No shortness of breath. [place], M.D with [facility] p[facility]. PRN metoprolol 5 mg IV administered, 12-lead EKG obtained. HR currently 80 BPM and BP 137/66 mmHg with a MAP of 88 @ 2343 on 01/27/20. Will continue to monitor.   Elaina Hoops, RN

## 2020-01-28 NOTE — Consult Note (Addendum)
Cardiology Consultation:   Patient ID: Kristen Murray MRN: [id number]; DOB: 03/04/1946  Admit date: 01/26/2020 Date of Consult: 01/28/2020  Primary Care Provider: [name], MD Primary Cardiologist:  - previous Ron Parker.  Primary Electrophysiologist:  None    Patient Profile:   Kristen Murray is a 74 y.o. female with a hx of DM, GERD, IDA, HL, HTN who is being seen today for the evaluation of Aflutter at the request of Dr. Louanne Belton.  History of Present Illness:   Kristen Murray is a 74 yo female with PMH noted above. Seen remotely in the past by Dr. Ron Parker. Last seen in consultation in 2016 for chest pain and had a low risk myoview. Followed by her PCP. Presented to the ED on 3/29 with bilateral jaw pain and chest pain in the setting of palpitations. Reported she had a similar episode about 2 weeks prior and seen at [facility] ED. Was given antibiotics and sent home. This episode started while she was walking out of the [place]. Reports onset of palpitations, with chest pressure that radiated up into her jaws bilaterally.   In the ED her labs showed stable electrolytes, Cr 0.84, WBC 3.5, Hgb 12.4, Ddimer 1.22. Initial EKG in the ED showed ST, but serial EKG showed new set atrial flutter rates in the 140s. She was started on a Cardizem drip and transitioned to oral dosing. She was not started on systemic anticoagulation with her hx of severe IDA. Per IM will need outpatient GI evaluation prior to initiation of Vanleer. In review of telemetry she has had intermittent burst episodes of rapid atrial flutter, along with what appears to be short runs of SVT. She symptomatic with these episodes, but otherwise feels ok.    Past Medical History:  Diagnosis Date  . Arthritis   . Borderline abnormal TFTs    as a teen  . Diabetes mellitus without complication ([place])   . E. coli UTI 10/05/12   [facility]; resistant only to Septra DS & tetracycline  . Eye infection   . Fibromyalgia   . GERD (gastroesophageal reflux  disease)   . Headache(784.0)   . 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 HSV-1 infection   . HTN (hypertension)   . Hyperglycemia   . Hyperlipidemia   . Meningioma ([place])   . Nephrolithiasis    Dr Amalia Hailey, [facility], Hx of [3][  . Polyp of colon   . Radiculopathy     Past Surgical History:  Procedure Laterality Date  . ABDOMINAL HYSTERECTOMY     TAH/BSO --fibroids, no cancer  . BALLOON DILATION N/A 07/20/2014   Procedure: BALLOON DILATION;  Surgeon: Garlan Fair, MD;  Location: [facility];  Service: Endoscopy;  Laterality: N/A;  . BREAST BIOPSY     Right  . CARPAL TUNNEL RELEASE     & trigger thumb RUE; Dr Alphonzo Cruise  . COLONOSCOPY W/ POLYPECTOMY  07/2004   Neg 2011; Dr Earle Gell  . CYSTOSCOPY  11/2009   Neg  . ESOPHAGOGASTRODUODENOSCOPY N/A 07/20/2014   Procedure: ESOPHAGOGASTRODUODENOSCOPY (EGD);  Surgeon: Garlan Fair, MD;  Location: [facility];  Service: Endoscopy;  Laterality: N/A;  . FLEXIBLE BRONCHOSCOPY W/ UPPER ENDOSCOPY     neg  . KNEE ARTHROSCOPY     Left  . ROTATOR CUFF REPAIR     R shoulder  . TONSILLECTOMY    . TRIGGER FINGER RELEASE Right    Right Thumb    Home Medications:  Prior to  Admission medications   Medication Sig Start Date End Date Taking? Authorizing Provider  acetaminophen (TYLENOL) 325 MG tablet Take 325-650 mg by mouth every 6 (six) hours as needed (for pain).   Yes [provider]  cyclobenzaprine (FLEXERIL) 10 MG tablet Take 2.5 mg by mouth daily as needed for muscle spasms.    Yes [provider]  fluorometholone (FML) 0.1 % ophthalmic suspension Place 1 drop into both eyes every Monday, Wednesday, and Friday.    Yes [provider]  fluticasone (FLONASE) 50 MCG/ACT nasal spray Place 1 spray into both nostrils 2 (two) times daily. 04/01/19  Yes Kozlow, Donnamarie Poag, MD  ibuprofen (ADVIL) 600 MG tablet Take 300-600 mg by mouth every 6 (six) hours as needed for headache or mild pain.    Yes [provider]  ipratropium (ATROVENT) 0.06 % nasal spray Place 1-2 sprays into both nostrils every 6 (six) hours as needed for rhinitis. 09/30/19  Yes Kozlow, Donnamarie Poag, MD  loratadine (CLARITIN) 10 MG tablet Take 1 tablet (10 mg total) by mouth daily. Patient taking differently: Take 5-10 mg by mouth daily.  01/10/20  Yes Isla Pence, MD  montelukast (SINGULAIR) 10 MG tablet Take 1 tablet (10 mg total) by mouth at bedtime. 09/30/19  Yes Kozlow, Donnamarie Poag, MD  Multiple Vitamin (MULTIVITAMIN) tablet Take 1 tablet by mouth daily. Shaklee brand   Yes [provider]  pantoprazole (PROTONIX) 40 MG tablet Take 40 mg by mouth 2 (two) times daily. 01/23/20  Yes [provider]  rosuvastatin (CRESTOR) 5 MG tablet Take 5 mg by mouth See admin instructions. Take 5 mg by mouth at bedtime on Mondays and Thursdays 11/20/19  Yes [provider]  verapamil (CALAN-SR) 180 MG CR tablet Take 180 mg by mouth at bedtime.  01/02/17  Yes [provider]  glucose blood (FREESTYLE LITE) test strip CHECK BLOOD SUGAR ONCE DAILY AS DIRECTED.  DX:790.29 01/09/14   Hendricks Limes, MD  JANUVIA 100 MG tablet Take 100 mg by mouth daily. 01/19/20   [provider]  Lancets (FREESTYLE) lancets Check blood sugar once daily as directed DX:790.29 (FREESTYLE FREEDOM LITE) 01/09/14   Hendricks Limes, MD   Inpatient Medications: Scheduled Meds: . aspirin EC  81 mg Oral Daily  . cyclobenzaprine  10 mg Oral TID  . diltiazem  60 mg Oral Q6H  . enoxaparin (LOVENOX) injection  40 mg Subcutaneous Q24H  . fluticasone  1 spray Each Nare BID  . insulin aspart  0-9 Units Subcutaneous TID WC  . ipratropium  1-2 spray Each Nare TID  . loratadine  10 mg Oral Daily  . montelukast  10 mg Oral QHS  . pantoprazole  40 mg Oral BID  . sodium chloride flush  3 mL Intravenous Once   Continuous Infusions:  PRN Meds: acetaminophen **OR** acetaminophen, metoprolol tartrate  Allergies:      Allergies  Allergen Reactions  . Codeine Hives and Other (See Comments)    Because of a history of documented adverse serious drug reaction, Medi Alert bracelet  is recommended  . Macrodantin [Nitrofurantoin Macrocrystal] Other (See Comments)    Numbness, aching all over  . Other Shortness Of Breath, Rash and Other (See Comments)    Pine nuts & walnuts throat congestion. No documented angioedema. Avocado- Rash  . Propoxyphene Hives, Swelling and Rash  . Propoxyphene Hcl Hives, Swelling and Rash  . Latex Rash  . Tizanidine Other (See Comments)    Reaction not recalled  .  Banana Rash  . 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 Tramadol Nausea Only    Social History:   Social History   Socioeconomic History  . Marital status: Married    Spouse name: Not on file  . Number of children: Not on file  . Years of education: Not on file  . Highest education level: Not on file  Occupational History  . Occupation: Estate manager/land agent: [employer]  Tobacco Use  . Smoking status: Never Smoker  . Smokeless tobacco: Never Used  Substance and Sexual Activity  . Alcohol use: No    Alcohol/week: 0.0 standard drinks  . Drug use: No  . Sexual activity: Not Currently    Birth control/protection: Post-menopausal    Comment: 1st intercourse- 20  partners - 1  Other Topics Concern  . Not on file  Social History Narrative   Regular Exercise-no         Social Determinants of Health   Financial Resource Strain:   . Difficulty of Paying Living Expenses:   Food Insecurity:   . Worried About Charity fundraiser in the Last Year:   . Arboriculturist in the Last Year:   Transportation Needs:   . Film/video editor (Medical):   Marland Kitchen Lack of Transportation (Non-Medical):   Physical Activity:   . Days of Exercise per Week:   . Minutes of Exercise per Session:   Stress:   . Feeling of  Stress :   Social Connections:   . Frequency of Communication with Friends and Family:   . Frequency of Social Gatherings with Friends and Family:   . Attends Religious Services:   . Active Member of Clubs or Organizations:   . Attends Archivist Meetings:   Marland Kitchen Marital Status:   Intimate Partner Violence:   . Fear of Current or Ex-Partner:   . Emotionally Abused:   Marland Kitchen Physically Abused:   . Sexually Abused:     Family History:    Family History  Problem Relation Age of Onset  . Alcohol abuse Mother   . Diabetes Maternal Aunt   . Diabetes Maternal Uncle   . Diabetes Maternal Grandmother   . Tuberculosis Maternal Uncle   . Other Father        unknown  . Coronary artery disease Neg Hx      ROS:  Please see the history of present illness.   All other ROS reviewed and negative.     Physical Exam/Data:   Vitals:   01/28/20 0513 01/28/20 0632 01/28/20 0748 01/28/20 1118  BP: (!) 142/75 (!) 150/78 132/70 128/68  Pulse: 93  95 94  Resp: 18     Temp: 98.2 F (36.8 C)  98 F (36.7 C) 98.4 F (36.9 C)  TempSrc: Oral  Oral Oral  SpO2: 95%  94% 98%  Weight: 75.3 kg     Height:        Intake/Output Summary (Last 24 hours) at 01/28/2020 1219 Last data filed at 01/27/2020 1709 Gross per 24 hour  Intake 40.93 ml  Output --  Net 40.93 ml   Last 3 Weights 01/28/2020 01/27/2020 01/26/2020  Weight (lbs) 165 lb 14.4 oz 165 lb 3.2 oz 158 lb  Weight (kg) 75.252 kg 74.934 kg 71.668 kg     Body mass index is 27.61 kg/m.  General:  Well nourished, well developed,  in no acute distress HEENT: normal Lymph: no adenopathy Neck: no JVD Endocrine:  No thryomegaly Vascular: No carotid bruits; FA pulses 2+ bilaterally without bruits  Cardiac:  normal S1, S2; RRR; soft systolic murmur  Lungs:  clear to auscultation bilaterally, no wheezing, rhonchi or rales  Abd: soft, nontender, no hepatomegaly  Ext: no edema Musculoskeletal:  No deformities, BUE and BLE strength normal and  equal Skin: warm and dry  Neuro:  CNs 2-12 intact, no focal abnormalities noted Psych:  Normal affect   EKG:  The EKG was personally reviewed and demonstrates:  ST--> Afib/flutter Telemetry:  Telemetry was personally reviewed and demonstrates:  SR, bursts of atrial fib/flutter, short run of SVT  Relevant CV Studies:  Echo: 2016  Study Conclusions   - Left ventricle: The cavity size was normal. There was mild  concentric hypertrophy. Systolic function was vigorous. The  estimated ejection fraction was in the range of 65% to 70%. Wall  motion was normal; there were no regional wall motion  abnormalities. Doppler parameters are consistent with abnormal  left ventricular relaxation (grade 1 diastolic dysfunction).  - Aortic valve: Trileaflet; normal thickness leaflets. There was  trivial regurgitation.  - Mitral valve: Structurally normal valve. There was no  regurgitation.  - Right ventricle: The cavity size was normal. Wall thickness was  normal.  - Right atrium: The atrium was normal in size.  - Tricuspid valve: There was no regurgitation.  - Pulmonic valve: There was no regurgitation.  - Pulmonary arteries: Systolic pressure was within the normal  range.  - Pericardium, extracardiac: There was no pericardial effusion.   Impressions:   - Normal biventricular size and systolic function. Abnormal  relaxation with normal filling pressures. No significant valvular  abnormality.    Laboratory Data:  High Sensitivity Troponin:   Recent Labs  Lab 01/10/20 1129 01/26/20 1826 01/26/20 2050  TROPONINIHS 4 6 6      Chemistry Recent Labs  Lab 01/26/20 1826 01/28/20 0251  NA 138 133*  K 4.4 3.8  CL 102 101  CO2 27 25  GLUCOSE 209* 182*  BUN 17 13  CREATININE 0.84 0.62  CALCIUM 9.3 9.0  GFRNONAA >60 >60  GFRAA >60 >60  ANIONGAP 9 7    No results for input(s): PROT, ALBUMIN, AST, ALT, ALKPHOS, BILITOT in the last 168 hours. Hematology Recent  Labs  Lab 01/26/20 1826 01/28/20 0251  WBC 3.5* 2.8*  RBC 4.35 4.03  HGB 12.4 11.2*  HCT 37.9 35.3*  MCV 87.1 87.6  MCH 28.5 27.8  MCHC 32.7 31.7  RDW 16.9* 17.2*  PLT 194 194   BNPNo results for input(s): BNP, PROBNP in the last 168 hours.  DDimer  Recent Labs  Lab 01/26/20 1826  DDIMER 1.22*     Radiology/Studies:  DG Chest 2 View  Result Date: 01/26/2020 CLINICAL DATA:  Chest pain EXAM: CHEST - 2 VIEW COMPARISON:  01/10/2020 FINDINGS: Lungs are clear.  No pleural effusion or pneumothorax. The heart is normal in size. Visualized osseous structures are within normal limits. IMPRESSION: Normal chest radiographs. Electronically Signed   By: Julian Hy M.D.   On: 01/26/2020 19:12   CT ANGIO CHEST PE W OR WO CONTRAST  Result Date: 01/26/2020 CLINICAL DATA:  Chest pain. EXAM: CT ANGIOGRAPHY CHEST WITH CONTRAST TECHNIQUE: Multidetector CT imaging of the chest was performed using the standard protocol during bolus administration of intravenous contrast. Multiplanar CT image reconstructions and MIPs were obtained to evaluate the vascular anatomy. CONTRAST:  157mL  OMNIPAQUE IOHEXOL 350 MG/ML SOLN COMPARISON:  None. FINDINGS: Cardiovascular: Contrast injection is sufficient to demonstrate satisfactory opacification of the pulmonary arteries to the segmental level. There is no pulmonary embolus. The main pulmonary artery is within normal limits for size. The heart size is normal. There is no significant pericardial effusion. There is no evidence for thoracic aortic dissection or aneurysm. The arch vessels are grossly patent. The left vertebral artery arises directly from the aortic arch. Mediastinum/Nodes: --No mediastinal or hilar lymphadenopathy. --No axillary lymphadenopathy. --No supraclavicular lymphadenopathy. --the patient is status post left hemithyroidectomy. There is a right-sided thyroid nodule measuring approximately 1.6 cm (axial series 4, image 3). --The esophagus is  unremarkable Lungs/Pleura: There are innumerable small pulmonary nodules bilaterally. For example there is a 3 mm pulmonary nodule in the right lower lobe (axial series 5, image 56). There are 2-3 mm pulmonary nodules in the posterior left lower lobe (axial series 5, image 35). There is no pneumothorax or large pleural effusion. There is no focal infiltrate. Upper Abdomen: There are multiple hypoattenuating structures throughout the liver favored to represent hepatic cysts, however many of them are too small to characterize on this study. There is no acute abnormality detected within the upper abdomen. Musculoskeletal: No chest wall abnormality. No acute or significant osseous findings. Review of the MIP images confirms the above findings. IMPRESSION: 1. No acute pulmonary embolism. 2. No evidence for thoracic aortic dissection. 3. There are innumerable small pulmonary nodules bilaterally measuring less than 6 mm. No follow-up needed if patient is low-risk (and has no known or suspected primary neoplasm). Non-contrast chest CT can be considered in 12 months if patient is high-risk. This recommendation follows the consensus statement: Guidelines for Management of Incidental Pulmonary Nodules Detected on CT Images: From the Fleischner Society 2017; Radiology 2017; 284:228-243. 4. Status post left-sided hemithyroidectomy. There is a right-sided thyroid nodule measuring approximately 1.6 cm. Recommend thyroid US(Ref: J Am Coll Radiol. 2015 Feb;12(2): 143-50). 5. Multiple small hypoattenuating structures are noted in the patient's liver. Some of these are too small to characterize but are still favored to represent hepatic cysts. Consider further evaluation with a nonemergent outpatient ultrasound. Electronically Signed   By: Constance Holster M.D.   On: 01/26/2020 19:49   US THYROID  Result Date: 01/28/2020 CLINICAL DATA:  Nodule.  Previous hemithyroidectomy. EXAM: THYROID ULTRASOUND TECHNIQUE: Ultrasound  examination of the thyroid gland and adjacent soft tissues was performed. COMPARISON:  08/19/2013 FINDINGS: Parenchymal Echotexture: Mildly heterogenous Isthmus: 0.1 cm thickness Right lobe: 4.9 x 1.6 x 2 cm, previously 4.8 x 1.7 x 1.7 Left lobe: Surgically absent _________________________________________________________ Estimated total number of nodules >/= 1 cm: 0 Number of spongiform nodules >/=  2 cm not described below (TR1): 0 Number of mixed cystic and solid nodules >/= 1.5 cm not described below ([place]): 0 _________________________________________________________ Nodule # 1: 0.9 cm hyperechoic, inferior right, previously 1.1 cm; stability for greater than 5 years implies benignity; This nodule does NOT meet TI-RADS criteria for biopsy or dedicated follow-up. In Nodule # 2: 0.8 cm hypoechoic nodule without calcifications, superior right; This nodule does NOT meet TI-RADS criteria for biopsy or dedicated follow-up. Additional scattered subcentimeter benign colloid cysts and complex cysts which do not merit follow-up. IMPRESSION: 1. No residual/recurrent tissue post left hemithyroidectomy. 2. Stable scattered small right nodules and cysts. No indication for biopsy or dedicated imaging follow-up. The above is in keeping with the ACR TI-RADS recommendations - J Am Coll Radiol 2017;14:587-595. Electronically Signed   By: Keturah Barre  Vernard Gambles M.D.   On: 01/28/2020 07:35   Assessment and Plan:   MIKEALA CARLOCK is a 74 y.o. female with a hx of DM, GERD, IDA, HL, HTN who is being seen today for the evaluation of Aflutter at the request of Dr. Louanne Belton.  1. Atrial Fib/Flutter RVR: She presented with chest pain in the setting of palpitations. Initially in SR on admission, then developed atrial flutter. Placed on Dilt gtt, and transitioned to oral dosing of 60mg  q6hr. Currently in SR, through did have bursts of atrial flutter during the evening.  -- CHA2DS2-VASc Score of at least 3. Would benefit from [facility]. Discussed the risk vs  benefits. Her Hgb remains stable. Will start IV heparin with bursts of aFib, plan to start DOAC once work up completed.  -- check echo -- would plan to consolidate Dilt tomorrow.  2. Chest pain: in the setting of rapid afib/flutter. hsTn have been negative. Will episodes of chest pain/jaw pain, will plan for coronary CT. Will add metoprolol 25mg  BID.   3. DM: on Januvia prior to admission. Hgb A1c 7.9. Management per primary  4. HTN: stable with current therapy.   5. IDA/Leukopenia: she is followed through the oncology clinic. There was some concern regarding her IDA and anticoagulation but H/H is stable. Would recommend starting [facility] for stroke risk reduction.   6. HL: on crestor 5mg  Monday/thursday  For questions or updates, please contact [place] Please consult [email or web address] for contact info under    Signed, Reino Bellis, NP  01/28/2020 12:19 PM    Attending Note:   The patient was seen and examined.  Agree with assessment and plan as noted above.  Changes made to the above note as needed.  Patient seen and independently examined with Reino Bellis, NP.   We discussed all aspects of the encounter. I agree with the assessment and plan as stated above.  1.   Palpitations: She has been found to have paroxysmal atrial fibrillation.  Her A. fib has been better controlled on IV Cardizem.  She has been transitioned to oral Cardizem.  We will also add low-dose beta-blocker to help with her tachycardia.  Eventually she needs to be started on Eliquis.   CHA2DS2-VASc Score of at least 3.   We will start her on heparin for now since we are also working up possibility of ischemic heart disease.  2.  Jaw pain: She has episodes of intense jaw pain whenever her heart rate goes fast.  This could be an angina equivalent.  We will schedule her for a coronary CT angiogram tomorrow.  We will start her onto metoprolol in addition to the Cardizem to help with rate control.   I have spent a  total of 40 minutes with patient reviewing hospital  notes , telemetry, EKGs, labs and examining patient as well as establishing an assessment and plan that was discussed with the patient. > 50% of time was spent in direct patient care.    Thayer Headings, Brooke Bonito., MD, [facility] 01/28/2020, 2:16 PM [address],  [place] Pager [phone]

## 2020-01-29 ENCOUNTER — Encounter (HOSPITAL_COMMUNITY): Payer: Self-pay | Admitting: Internal Medicine

## 2020-01-29 DIAGNOSIS — I4891 Unspecified atrial fibrillation: Secondary | ICD-10-CM

## 2020-01-29 DIAGNOSIS — M797 Fibromyalgia: Secondary | ICD-10-CM | POA: Diagnosis present

## 2020-01-29 DIAGNOSIS — I10 Essential (primary) hypertension: Secondary | ICD-10-CM | POA: Diagnosis present

## 2020-01-29 DIAGNOSIS — E785 Hyperlipidemia, unspecified: Secondary | ICD-10-CM | POA: Diagnosis present

## 2020-01-29 DIAGNOSIS — Z6826 Body mass index (BMI) 26.0-26.9, adult: Secondary | ICD-10-CM | POA: Diagnosis not present

## 2020-01-29 DIAGNOSIS — Z8719 Personal history of other diseases of the digestive system: Secondary | ICD-10-CM | POA: Diagnosis not present

## 2020-01-29 DIAGNOSIS — E119 Type 2 diabetes mellitus without complications: Secondary | ICD-10-CM | POA: Diagnosis present

## 2020-01-29 DIAGNOSIS — R0789 Other chest pain: Secondary | ICD-10-CM | POA: Diagnosis present

## 2020-01-29 DIAGNOSIS — Z91040 Latex allergy status: Secondary | ICD-10-CM | POA: Diagnosis not present

## 2020-01-29 DIAGNOSIS — D509 Iron deficiency anemia, unspecified: Secondary | ICD-10-CM | POA: Diagnosis present

## 2020-01-29 DIAGNOSIS — Z20822 Contact with and (suspected) exposure to covid-19: Secondary | ICD-10-CM | POA: Diagnosis present

## 2020-01-29 DIAGNOSIS — E663 Overweight: Secondary | ICD-10-CM | POA: Diagnosis present

## 2020-01-29 DIAGNOSIS — M5410 Radiculopathy, site unspecified: Secondary | ICD-10-CM | POA: Diagnosis present

## 2020-01-29 DIAGNOSIS — R002 Palpitations: Secondary | ICD-10-CM | POA: Diagnosis present

## 2020-01-29 DIAGNOSIS — M1990 Unspecified osteoarthritis, unspecified site: Secondary | ICD-10-CM | POA: Diagnosis present

## 2020-01-29 DIAGNOSIS — Z811 Family history of alcohol abuse and dependence: Secondary | ICD-10-CM | POA: Diagnosis not present

## 2020-01-29 DIAGNOSIS — Z885 Allergy status to narcotic agent status: Secondary | ICD-10-CM | POA: Diagnosis not present

## 2020-01-29 DIAGNOSIS — Z888 Allergy status to other drugs, medicaments and biological substances status: Secondary | ICD-10-CM | POA: Diagnosis not present

## 2020-01-29 DIAGNOSIS — I480 Paroxysmal atrial fibrillation: Secondary | ICD-10-CM | POA: Diagnosis present

## 2020-01-29 DIAGNOSIS — R079 Chest pain, unspecified: Secondary | ICD-10-CM | POA: Diagnosis not present

## 2020-01-29 DIAGNOSIS — I4892 Unspecified atrial flutter: Secondary | ICD-10-CM | POA: Diagnosis present

## 2020-01-29 DIAGNOSIS — Z833 Family history of diabetes mellitus: Secondary | ICD-10-CM | POA: Diagnosis not present

## 2020-01-29 DIAGNOSIS — Z881 Allergy status to other antibiotic agents status: Secondary | ICD-10-CM | POA: Diagnosis not present

## 2020-01-29 DIAGNOSIS — K219 Gastro-esophageal reflux disease without esophagitis: Secondary | ICD-10-CM | POA: Diagnosis present

## 2020-01-29 DIAGNOSIS — G43909 Migraine, unspecified, not intractable, without status migrainosus: Secondary | ICD-10-CM | POA: Diagnosis present

## 2020-01-29 DIAGNOSIS — R6884 Jaw pain: Secondary | ICD-10-CM | POA: Diagnosis present

## 2020-01-29 LAB — CBC
HCT: 36.5 % (ref 36.0–46.0)
Hemoglobin: 11.9 g/dL — ABNORMAL LOW (ref 12.0–15.0)
MCH: 27.9 pg (ref 26.0–34.0)
MCHC: 32.6 g/dL (ref 30.0–36.0)
MCV: 85.7 fL (ref 80.0–100.0)
Platelets: 195 10*3/uL (ref 150–400)
RBC: 4.26 MIL/uL (ref 3.87–5.11)
RDW: 16.8 % — ABNORMAL HIGH (ref 11.5–15.5)
WBC: 3.1 10*3/uL — ABNORMAL LOW (ref 4.0–10.5)
nRBC: 0 % (ref 0.0–0.2)

## 2020-01-29 LAB — HEPARIN LEVEL (UNFRACTIONATED)
Heparin Unfractionated: 0.67 IU/mL (ref 0.30–0.70)
Heparin Unfractionated: 0.79 IU/mL — ABNORMAL HIGH (ref 0.30–0.70)
Heparin Unfractionated: 0.58 IU/mL (ref 0.30–0.70)

## 2020-01-29 LAB — GLUCOSE, CAPILLARY
Glucose-Capillary: 160 mg/dL — ABNORMAL HIGH (ref 70–99)
Glucose-Capillary: 126 mg/dL — ABNORMAL HIGH (ref 70–99)
Glucose-Capillary: 178 mg/dL — ABNORMAL HIGH (ref 70–99)
Glucose-Capillary: 152 mg/dL — ABNORMAL HIGH (ref 70–99)

## 2020-01-29 MED ORDER — METOPROLOL TARTRATE 50 MG PO TABS
50.0000 mg | ORAL_TABLET | Freq: Two times a day (BID) | ORAL | Status: DC
  Administered 2020-01-29 – 2020-01-30 (×2): 50 mg via ORAL
  Filled 2020-01-29 (×2): qty 1

## 2020-01-29 MED ORDER — DILTIAZEM HCL 60 MG PO TABS
60.0000 mg | ORAL_TABLET | Freq: Three times a day (TID) | ORAL | Status: DC
  Administered 2020-01-29 – 2020-01-30 (×3): 60 mg via ORAL
  Filled 2020-01-29 (×3): qty 1

## 2020-01-29 MED ORDER — CYCLOBENZAPRINE HCL 10 MG PO TABS: 10.0000 mg | ORAL_TABLET | Freq: Three times a day (TID) | ORAL | Status: DC | PRN

## 2020-01-29 NOTE — Progress Notes (Signed)
ANTICOAGULATION CONSULT NOTE - Follow Up Consult  Pharmacy Consult for heparin Indication: atrial fibrillation  Labs: Recent Labs    01/26/20 1826 01/26/20 2050 01/28/20 0251 01/28/20 2312  HGB 12.4  --  11.2*  --   HCT 37.9  --  35.3*  --   PLT 194  --  194  --   HEPARINUNFRC  --   --   --  0.67  CREATININE 0.84  --  0.62  --   TROPONINIHS 6 6  --   --     Assessment/Plan:  74yo female therapeutic on heparin with initial dosing for Afib. Will continue gtt at current rate and confirm stable with am labs.   Wynona Neat, PharmD, BCPS  01/29/2020,12:13 AM

## 2020-01-29 NOTE — Progress Notes (Signed)
Progress Note  Patient Name: Kristen Murray Date of Encounter: 01/29/2020  Primary Cardiologist:     Subjective   74 year old female with history of diabetes mellitus, hyperlipidemia, hypertension who was recently diagnosed with atrial flutter.  She has been started on Eliquis.  She is on low-dose metoprolol as well as Cardizem.  We originally scheduled her for a coronary CT angiogram today but she was too tachycardic to have the CT angiogram.  We have scheduled her for a Lexiscan Myoview study tomorrow.  Inpatient Medications    Scheduled Meds: . aspirin EC  81 mg Oral Daily  . diltiazem  60 mg Oral Q6H  . fluticasone  1 spray Each Nare BID  . insulin aspart  0-9 Units Subcutaneous TID WC  . ipratropium  1-2 spray Each Nare TID  . loratadine  10 mg Oral Daily  . metoprolol tartrate  25 mg Oral BID  . montelukast  10 mg Oral QHS  . pantoprazole  40 mg Oral BID  . sodium chloride flush  3 mL Intravenous Once   Continuous Infusions: . heparin 950 Units/hr (01/29/20 1116)   PRN Meds: acetaminophen **OR** acetaminophen, cyclobenzaprine, metoprolol tartrate   Vital Signs    Vitals:   01/29/20 0809 01/29/20 1000 01/29/20 1244 01/29/20 1557  BP: 133/72 131/70 129/80 106/65  Pulse: 93  81 76  Resp:    18  Temp: 98.4 F (36.9 C)   97.8 F (36.6 C)  TempSrc: Oral   Oral  SpO2:    98%  Weight:      Height:        Intake/Output Summary (Last 24 hours) at 01/29/2020 1804 Last data filed at 01/29/2020 0600 Gross per 24 hour  Intake 250.2 ml  Output --  Net 250.2 ml   Last 3 Weights 01/29/2020 01/28/2020 01/27/2020  Weight (lbs) 165 lb 1.6 oz 165 lb 14.4 oz 165 lb 3.2 oz  Weight (kg) 74.889 kg 75.252 kg 74.934 kg      Telemetry    NSR at 98 - Personally Reviewed  ECG     - Personally Reviewed  Physical Exam   GEN: think female,  nad   Neck: No JVD Cardiac: RRR, no murmurs, rubs, or gallops.  Respiratory: Clear to auscultation bilaterally. GI: Soft,  nontender, non-distended  MS: No edema; No deformity. Neuro:  Nonfocal  Psych: Normal affect   Labs    High Sensitivity Troponin:   Recent Labs  Lab 01/10/20 1129 01/26/20 1826 01/26/20 2050  TROPONINIHS 4 6 6       Chemistry Recent Labs  Lab 01/26/20 1826 01/28/20 0251  NA 138 133*  K 4.4 3.8  CL 102 101  CO2 27 25  GLUCOSE 209* 182*  BUN 17 13  CREATININE 0.84 0.62  CALCIUM 9.3 9.0  GFRNONAA >60 >60  GFRAA >60 >60  ANIONGAP 9 7     Hematology Recent Labs  Lab 01/26/20 1826 01/28/20 0251 01/29/20 0410  WBC 3.5* 2.8* 3.1*  RBC 4.35 4.03 4.26  HGB 12.4 11.2* 11.9*  HCT 37.9 35.3* 36.5  MCV 87.1 87.6 85.7  MCH 28.5 27.8 27.9  MCHC 32.7 31.7 32.6  RDW 16.9* 17.2* 16.8*  PLT 194 194 195    BNPNo results for input(s): BNP, PROBNP in the last 168 hours.   DDimer  Recent Labs  Lab 01/26/20 1826  DDIMER 1.22*     Radiology    ECHOCARDIOGRAM COMPLETE  Result Date: 01/28/2020    ECHOCARDIOGRAM REPORT  Patient Name:   Kristen Murray Date of Exam: 01/28/2020 Medical Rec #:  [id number]     Height:       65.0 in Accession #:    [id number]    Weight:       165.9 lb Date of Birth:  07-Jan-1946      BSA:          1.827 m Patient Age:    24 years      BP:           128/68 mmHg Patient Gender: F             HR:           94 bpm. Exam Location:  Inpatient Procedure: 2D Echo, Cardiac Doppler and Color Doppler Indications:    786.50 chest pain  History:        Patient has prior history of Echocardiogram examinations, most                 recent 11/27/2014. Risk Factors:Dyslipidemia and Diabetes.  Sonographer:    Jannett Celestine RDCS (AE) Referring Phys: [place]  1. Left ventricular ejection fraction, by estimation, is 60 to 65%. The left ventricle has normal function. The left ventricle has no regional wall motion abnormalities. There is moderate concentric left ventricular hypertrophy. Left ventricular diastolic parameters are indeterminate.  2. Right  ventricular systolic function is normal. The right ventricular size is normal.  3. The mitral valve is normal in structure. Trivial mitral valve regurgitation. No evidence of mitral stenosis.  4. The aortic valve is tricuspid. Aortic valve regurgitation is trivial. No aortic stenosis is present.  5. The inferior vena cava is normal in size with greater than 50% respiratory variability, suggesting right atrial pressure of 3 mmHg. Comparison(s): No significant change from prior study. FINDINGS  Left Ventricle: Left ventricular ejection fraction, by estimation, is 60 to 65%. The left ventricle has normal function. The left ventricle has no regional wall motion abnormalities. The left ventricular internal cavity size was normal in size. There is  moderate concentric left ventricular hypertrophy. Left ventricular diastolic parameters are indeterminate. Right Ventricle: The right ventricular size is normal. No increase in right ventricular wall thickness. Right ventricular systolic function is normal. Left Atrium: Left atrial size was normal in size. Right Atrium: Right atrial size was normal in size. Pericardium: There is no evidence of pericardial effusion. Mitral Valve: The mitral valve is normal in structure. There is mild thickening of the mitral valve leaflet(s). There is mild calcification of the mitral valve leaflet(s). Mild to moderate mitral annular calcification. Trivial mitral valve regurgitation.  No evidence of mitral valve stenosis. Tricuspid Valve: The tricuspid valve is normal in structure. Tricuspid valve regurgitation is trivial. No evidence of tricuspid stenosis. Aortic Valve: The aortic valve is tricuspid. Aortic valve regurgitation is trivial. No aortic stenosis is present. Pulmonic Valve: The pulmonic valve was grossly normal. Pulmonic valve regurgitation is trivial. No evidence of pulmonic stenosis. Aorta: The aortic root, ascending aorta and aortic arch are all structurally normal, with no  evidence of dilitation or obstruction. Venous: The inferior vena cava is normal in size with greater than 50% respiratory variability, suggesting right atrial pressure of 3 mmHg. IAS/Shunts: No atrial level shunt detected by color flow Doppler.  LEFT VENTRICLE PLAX 2D LVIDd:         3.27 cm  Diastology LVIDs:         2.20 cm  LV e' lateral:  10.70 cm/s LV PW:         1.81 cm  LV E/e' lateral: 7.2 LV IVS:        1.52 cm  LV e' medial:    6.64 cm/s LVOT diam:     1.90 cm  LV E/e' medial:  11.6 LV SV:         56 LV SV Index:   31 LVOT Area:     2.84 cm  RIGHT VENTRICLE RV S prime:     11.20 cm/s TAPSE (M-mode): 2.4 cm LEFT ATRIUM             Index       RIGHT ATRIUM           Index LA diam:        3.40 cm 1.86 cm/m  RA Area:     15.40 cm LA Vol (A2C):   34.3 ml 18.77 ml/m RA Volume:   35.60 ml  19.48 ml/m LA Vol (A4C):   31.3 ml 17.13 ml/m LA Biplane Vol: 33.9 ml 18.55 ml/m  AORTIC VALVE LVOT Vmax:   102.00 cm/s LVOT Vmean:  67.600 cm/s LVOT VTI:    0.197 m  AORTA Ao Root diam: 2.60 cm MITRAL VALVE MV Area (PHT): 3.31 cm    SHUNTS MV Decel Time: 229 msec    Systemic VTI:  0.20 m MV E velocity: 76.70 cm/s  Systemic Diam: 1.90 cm MV A velocity: 73.30 cm/s MV E/A ratio:  1.05 Buford Dresser MD Electronically signed by Buford Dresser MD Signature Date/Time: 01/28/2020/8:29:27 PM    Final    US THYROID  Result Date: 01/28/2020 CLINICAL DATA:  Nodule.  Previous hemithyroidectomy. EXAM: THYROID ULTRASOUND TECHNIQUE: Ultrasound examination of the thyroid gland and adjacent soft tissues was performed. COMPARISON:  08/19/2013 FINDINGS: Parenchymal Echotexture: Mildly heterogenous Isthmus: 0.1 cm thickness Right lobe: 4.9 x 1.6 x 2 cm, previously 4.8 x 1.7 x 1.7 Left lobe: Surgically absent _________________________________________________________ Estimated total number of nodules >/= 1 cm: 0 Number of spongiform nodules >/=  2 cm not described below (TR1): 0 Number of mixed cystic and solid nodules >/=  1.5 cm not described below ([place]): 0 _________________________________________________________ Nodule # 1: 0.9 cm hyperechoic, inferior right, previously 1.1 cm; stability for greater than 5 years implies benignity; This nodule does NOT meet TI-RADS criteria for biopsy or dedicated follow-up. In Nodule # 2: 0.8 cm hypoechoic nodule without calcifications, superior right; This nodule does NOT meet TI-RADS criteria for biopsy or dedicated follow-up. Additional scattered subcentimeter benign colloid cysts and complex cysts which do not merit follow-up. IMPRESSION: 1. No residual/recurrent tissue post left hemithyroidectomy. 2. Stable scattered small right nodules and cysts. No indication for biopsy or dedicated imaging follow-up. The above is in keeping with the ACR TI-RADS recommendations - J Am Coll Radiol 2017;14:587-595. Electronically Signed   By: Lucrezia Europe M.D.   On: 01/28/2020 07:35    Cardiac Studies      Patient Profile     74 y.o. female with new onset atrial flutter/atrial fibrillation.  Assessment & Plan   1.  Atrial fibrillation: She has been started on Eliquis.  She is on Lopressor and diltiazem. She still slightly tachycardic.  I would like to increase her metoprolol to 50 mg twice a day.  I will decrease her Cardizem to 60 mg 3 times a day.  2.  Jaw pain: She is scheduled for Lexiscan Myoview study tomorrow.      For questions or updates, please  contact [place] Please consult [email or web address] for contact info under        Signed, Mertie Moores, MD  01/29/2020, 6:04 PM

## 2020-01-29 NOTE — Progress Notes (Signed)
[place] for heparin Indication: atrial fibrillation  Allergies  Allergen Reactions  . Codeine Hives and Other (See Comments)    Because of a history of documented adverse serious drug reaction, Medi Alert bracelet  is recommended  . Macrodantin [Nitrofurantoin Macrocrystal] Other (See Comments)    Numbness, aching all over  . Other Shortness Of Breath, Rash and Other (See Comments)    Pine nuts & walnuts throat congestion. No documented angioedema. Avocado- Rash  . Propoxyphene Hives, Swelling and Rash  . Propoxyphene Hcl Hives, Swelling and Rash  . Latex Rash  . Tizanidine Other (See Comments)    Reaction not recalled  . Banana Rash  . 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 Tramadol Nausea Only    Patient Measurements: Height: 5\' 5"  (165.1 cm) Weight: 74.9 kg (165 lb 1.6 oz) IBW/kg (Calculated) : 57 Heparin Dosing Weight: 75kg  Vital Signs: Temp: 97.8 F (36.6 C) (04/01 1557) Temp Source: Oral (04/01 1557) BP: 106/65 (04/01 1557) Pulse Rate: 76 (04/01 1557)  Labs: Recent Labs    01/26/20 2050 01/28/20 0251 01/28/20 2312 01/29/20 0410 01/29/20 1933  HGB  --  11.2*  --  11.9*  --   HCT  --  35.3*  --  36.5  --   PLT  --  194  --  195  --   HEPARINUNFRC  --   --  0.67 0.79* 0.58  CREATININE  --  0.62  --   --   --   TROPONINIHS 6  --   --   --   --     Estimated Creatinine Clearance: 62.5 mL/min (by C-G formula based on SCr of 0.62 mg/dL).   Medical History: Past Medical History:  Diagnosis Date  . Arthritis   . Borderline abnormal TFTs    as a teen  . Diabetes mellitus without complication ([place])   . E. coli UTI 10/05/12   [facility]; resistant only to Septra DS & tetracycline  . Eye infection   . Fibromyalgia   . GERD (gastroesophageal reflux disease)   . Headache(784.0)   . 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 HSV-1 infection   . HTN (hypertension)   . Hyperglycemia   . Hyperlipidemia   . Meningioma ([place])   . Nephrolithiasis    Dr Amalia Hailey, [facility], Hx of [3][  . Polyp of colon   . Radiculopathy      Assessment: 57 yoF admitted with palpitations found to have new AFL. Pharmacy asked to start IV heparin, CHADSVASc 3. No AC PTA, hx of IDA but Hgb stable at 11.9, plt 195.   Heparin level this evening is therapeutic on 950 units/hr. RN reports no s/s of bleeding   Goal of Therapy:  Heparin level 0.3-0.7 units/ml Monitor platelets by anticoagulation protocol: Yes   Plan:  -Continue heparin at 950 units/hr -F/u AM level  -Daily heparin level and CBC  Albertina Parr, PharmD., BCPS Clinical Pharmacist Clinical phone for 01/29/20 until [id number] If after 10pm, please refer to [facility] for unit-specific pharmacist

## 2020-01-29 NOTE — Progress Notes (Signed)
RN notified of patient having jaw pain.  Upon speaking with patient she stated while rubbing the area that the pain was bilaterally on neck under ears.  EKG obtained.  Dr. Louanne Belton notifed and aware.  No new orders received.

## 2020-01-29 NOTE — Progress Notes (Signed)
Right AC IV painful to touch.  Swelling and warmth noted at site.   IV removed by Ethlyn Daniels, SN [facility]).

## 2020-01-29 NOTE — Progress Notes (Signed)
RN notified that patient in AFIB.  Dr Louanne Belton aware.

## 2020-01-29 NOTE — Progress Notes (Signed)
[place] for heparin Indication: atrial fibrillation  Allergies  Allergen Reactions  . Codeine Hives and Other (See Comments)    Because of a history of documented adverse serious drug reaction, Medi Alert bracelet  is recommended  . Macrodantin [Nitrofurantoin Macrocrystal] Other (See Comments)    Numbness, aching all over  . Other Shortness Of Breath, Rash and Other (See Comments)    Pine nuts & walnuts throat congestion. No documented angioedema. Avocado- Rash  . Propoxyphene Hives, Swelling and Rash  . Propoxyphene Hcl Hives, Swelling and Rash  . Latex Rash  . Tizanidine Other (See Comments)    Reaction not recalled  . Banana Rash  . 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 Tramadol Nausea Only    Patient Measurements: Height: 5\' 5"  (165.1 cm) Weight: 74.9 kg (165 lb 1.6 oz) IBW/kg (Calculated) : 57 Heparin Dosing Weight: 75kg  Vital Signs: Temp: 98.4 F (36.9 C) (04/01 0809) Temp Source: Oral (04/01 0809) BP: 131/70 (04/01 1000) Pulse Rate: 93 (04/01 0809)  Labs: Recent Labs    01/26/20 1826 01/26/20 1826 01/26/20 2050 01/28/20 0251 01/28/20 2312 01/29/20 0410  HGB 12.4   < >  --  11.2*  --  11.9*  HCT 37.9  --   --  35.3*  --  36.5  PLT 194  --   --  194  --  195  HEPARINUNFRC  --   --   --   --  0.67 0.79*  CREATININE 0.84  --   --  0.62  --   --   TROPONINIHS 6  --  6  --   --   --    < > = values in this interval not displayed.    Estimated Creatinine Clearance: 62.5 mL/min (by C-G formula based on SCr of 0.62 mg/dL).   Medical History: Past Medical History:  Diagnosis Date  . Arthritis   . Borderline abnormal TFTs    as a teen  . Diabetes mellitus without complication ([place])   . E. coli UTI 10/05/12   [facility]; resistant only to Septra DS & tetracycline  . Eye infection   . Fibromyalgia   . GERD (gastroesophageal reflux  disease)   . Headache(784.0)   . 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 HSV-1 infection   . HTN (hypertension)   . Hyperglycemia   . Hyperlipidemia   . Meningioma ([place])   . Nephrolithiasis    Dr Amalia Hailey, [facility], Hx of [3][  . Polyp of colon   . Radiculopathy      Assessment: 13 yoF admitted with palpitations found to have new AFL. Pharmacy asked to start IV heparin, CHADSVASc 3. No AC PTA, hx of IDA but Hgb stable at 11.9, plt 195.   Heparin level above goal this morning at 0.79. No bleeding or IV issues noted.   Goal of Therapy:  Heparin level 0.3-0.7 units/ml Monitor platelets by anticoagulation protocol: Yes   Plan:  -Reduce heparin to 950 units/hr -Check 8hr heparin level -Daily heparin level and CBC  Erin Hearing PharmD., BCPS Clinical Pharmacist 01/29/2020 11:11 AM

## 2020-01-29 NOTE — Progress Notes (Signed)
PROGRESS NOTE  Kristen Murray [id number] DOB: 14-Oct-1946 DOA: 01/26/2020 PCP: [name], MD   LOS: 0 days   Brief narrative: As per HPI,  Kristen Murray is a 74 y.o. female with past medical history of diabetes mellitus, iron deficiency anemia,  migraines on verapamil, GERD,  presented to the hospital with bilateral jaw pain and chest pain along with palpitations.  Patient did have bilateral jaw pain about 2 weeks ago came to [facility] ED, at that point she also had a sore throat and some cough, she was given antibiotics and sent home.   Patient also was found to have leukopenia since late last year, and iron deficiency anemia, for which she received 5 iron infusion in the oncologist office this year.  She denied any abdominal pain, no history of melena, she never had EGD or colonoscopy. ED Course: At [facility] ED, EKG showed a flutter 2-1 transduction, Cardizem drip was started and was transferred to [facility].  Patient also received IV Lopressor and IV fluids.  Assessment/Plan:  Principal Problem:   Atrial flutter, paroxysmal (HCC) Active Problems:   Chest pain at rest   A-fib [facility])  Chest pain, jaw pain. Complaining of jaw pain today.  EKG from today showed rate controlled atrial fibrillation.Will need to rule out ischemic etiology.  CT angiogram was performed which showed no evidence of pulmonary embolism but some pulmonary nodules bilaterally.  Patient was given heparin drip.  Cardiology consulted and initially CT coronary was recommended but due to tachycardia this has been changed to stress test which will be performed tomorrow.  Spoke with cardiology about it.    Paroxysmal A. Fib/atrial flutter with rapid ventricular response.   Rate controlled now.  Cardiology has seen the patient.  Verapamil was discontinued and patient has been continued on aspirin, Cardizem and Toprol. Patient will need long-term anticoagulation on discharge.CHA2DS2-VASc Score of at least 3.   Patient does have history of iron deficiency anemia.  No overt bleeding.  Will get stool occult blood screening.  Will need to closely monitor hemoglobin as outpatient.  Thyroid nodules Incidental finding on the CT scan.  History of left hemithyroidectomy.  Ultrasound of the thyroid showed stable scattered right nodules and cyst.  No indication for biopsy.  TSH, free T3 and T4 was within normal limits  Leukopenia Mild.  We will continue to monitor.  No thrombocytopenia.  Will need outpatient follow-up with oncology.  Patient has followed up with oncology for this in the past..  Migraines/jaw pain Like this has been a chronic issue, she had temporal artery biopsy 2 years ago, which was negative.  Verapamil has been changed to Cardizem at this time..  Diabetes mellitus type II. Hemoglobin A1c was 7.9.  Continue sliding scale insulin, Accu-Cheks diabetic diet.  Lung nodules Periodic CT follow-up as outpatient.  VTE Prophylaxis: Heparin subcu  Code Status: Full code  Family Communication: None  Disposition Plan:  . Patient is from home . Likely disposition to home  in 1 to 2 days . Barriers to discharge: Atrial flutter titrating medication, on heparin drip.  Will need PT for ambulation when her rhythm stabilizes, pending stress test in a.m.  Cardiology recommendations.  We will change the patient's status to inpatient at this time.  Consultants:  Cardiology  Procedures:  None  Antibiotics:  . None  Anti-infectives (From admission, onward)   Start     Dose/Rate Route Frequency Ordered Stop   01/27/20 1845  valACYclovir (VALTREX) tablet  500 mg  Status:  Discontinued     500 mg Oral Daily 01/27/20 1844 01/27/20 1952     Subjective: Today, patient was seen and examined at bedside.  Patient denied dyspnea but complains of jaw pain.  Patient was initially in sinus rhythm but again was in atrial flutter but with rate controlled.  Objective: Vitals:   01/29/20 1244  01/29/20 1557  BP: 129/80 106/65  Pulse: 81 76  Resp:  18  Temp:  97.8 F (36.6 C)  SpO2:  98%    Intake/Output Summary (Last 24 hours) at 01/29/2020 1617 Last data filed at 01/29/2020 0600 Gross per 24 hour  Intake 250.2 ml  Output --  Net 250.2 ml   Filed Weights   01/27/20 1726 01/28/20 0513 01/29/20 0422  Weight: 74.9 kg 75.3 kg 74.9 kg   Body mass index is 27.47 kg/m.   Physical Exam:  GENERAL: Patient is alert awake and oriented. Not in obvious distress. HENT: No scleral pallor or icterus. Pupils equally reactive to light. Oral mucosa is moist NECK: is supple, no gross swelling noted. CHEST: Clear to auscultation. No crackles or wheezes.  Diminished breath sounds bilaterally. CVS: S1 and S2 heard, systolic murmur, irregular rhythm.   ABDOMEN: Soft, non-tender, bowel sounds are present. EXTREMITIES: No edema. CNS: Cranial nerves are intact. No focal motor deficits. SKIN: warm and dry without rashes.  Data Review: I have personally reviewed the following laboratory data and studies,  CBC: Recent Labs  Lab 01/26/20 1826 01/28/20 0251 01/29/20 0410  WBC 3.5* 2.8* 3.1*  HGB 12.4 11.2* 11.9*  HCT 37.9 35.3* 36.5  MCV 87.1 87.6 85.7  PLT 194 194 0000000   Basic Metabolic Panel: Recent Labs  Lab 01/26/20 1826 01/28/20 0251  NA 138 133*  K 4.4 3.8  CL 102 101  CO2 27 25  GLUCOSE 209* 182*  BUN 17 13  CREATININE 0.84 0.62  CALCIUM 9.3 9.0  MG 2.1  --    Liver Function Tests: No results for input(s): AST, ALT, ALKPHOS, BILITOT, PROT, ALBUMIN in the last 168 hours. No results for input(s): LIPASE, AMYLASE in the last 168 hours. No results for input(s): AMMONIA in the last 168 hours. Cardiac Enzymes: No results for input(s): CKTOTAL, CKMB, CKMBINDEX, TROPONINI in the last 168 hours. BNP (last 3 results) No results for input(s): BNP in the last 8760 hours.  ProBNP (last 3 results) No results for input(s): PROBNP in the last 8760 hours.  CBG: Recent Labs   Lab 01/28/20 1115 01/28/20 1636 01/28/20 2137 01/29/20 0825 01/29/20 1144  GLUCAP 181* 145* 205* 160* 126*   Recent Results (from the past 240 hour(s))  SARS CORONAVIRUS 2 (TAT 6-24 HRS) Nasopharyngeal Nasopharyngeal Swab     Status: None   Collection Time: 01/26/20  9:40 PM   Specimen: Nasopharyngeal Swab  Result Value Ref Range Status   SARS Coronavirus 2 NEGATIVE NEGATIVE Final    Comment: (NOTE) SARS-CoV-2 target nucleic acids are NOT DETECTED. The SARS-CoV-2 RNA is generally detectable in upper and lower respiratory specimens during the acute phase of infection. Negative results do not preclude SARS-CoV-2 infection, do not rule out co-infections with other pathogens, and should not be used as the sole basis for treatment or other patient management decisions. Negative results must be combined with clinical observations, patient history, and epidemiological information. The expected result is Negative. Fact Sheet for Patients: SugarRoll.be Fact Sheet for Healthcare Providers: https://www.woods-mathews.com/ This test is not yet approved or cleared by the [place]  States FDA and  has been authorized for detection and/or diagnosis of SARS-CoV-2 by FDA under an Emergency Use Authorization (EUA). This EUA will remain  in effect (meaning this test can be used) for the duration of the COVID-19 declaration under Section 56 4(b)(1) of the Act, 21 U.S.C. section 360bbb-3(b)(1), unless the authorization is terminated or revoked sooner. Performed at [facility], [place] [address] [place], [place] [address]      Studies: ECHOCARDIOGRAM COMPLETE  Result Date: 01/28/2020    ECHOCARDIOGRAM REPORT   Patient Name:   Breawna HAYLY COGLIANO Date of Exam: 01/28/2020 Medical Rec #:  [id number]     Height:       65.0 in Accession #:    [id number]    Weight:       165.9 lb Date of Birth:  02-Nov-1945      BSA:          1.827 m Patient Age:    26 years      BP:            128/68 mmHg Patient Gender: F             HR:           94 bpm. Exam Location:  Inpatient Procedure: 2D Echo, Cardiac Doppler and Color Doppler Indications:    786.50 chest pain  History:        Patient has prior history of Echocardiogram examinations, most                 recent 11/27/2014. Risk Factors:Dyslipidemia and Diabetes.  Sonographer:    Jannett Celestine RDCS (AE) Referring Phys: [place]  1. Left ventricular ejection fraction, by estimation, is 60 to 65%. The left ventricle has normal function. The left ventricle has no regional wall motion abnormalities. There is moderate concentric left ventricular hypertrophy. Left ventricular diastolic parameters are indeterminate.  2. Right ventricular systolic function is normal. The right ventricular size is normal.  3. The mitral valve is normal in structure. Trivial mitral valve regurgitation. No evidence of mitral stenosis.  4. The aortic valve is tricuspid. Aortic valve regurgitation is trivial. No aortic stenosis is present.  5. The inferior vena cava is normal in size with greater than 50% respiratory variability, suggesting right atrial pressure of 3 mmHg. Comparison(s): No significant change from prior study. FINDINGS  Left Ventricle: Left ventricular ejection fraction, by estimation, is 60 to 65%. The left ventricle has normal function. The left ventricle has no regional wall motion abnormalities. The left ventricular internal cavity size was normal in size. There is  moderate concentric left ventricular hypertrophy. Left ventricular diastolic parameters are indeterminate. Right Ventricle: The right ventricular size is normal. No increase in right ventricular wall thickness. Right ventricular systolic function is normal. Left Atrium: Left atrial size was normal in size. Right Atrium: Right atrial size was normal in size. Pericardium: There is no evidence of pericardial effusion. Mitral Valve: The mitral valve is normal in  structure. There is mild thickening of the mitral valve leaflet(s). There is mild calcification of the mitral valve leaflet(s). Mild to moderate mitral annular calcification. Trivial mitral valve regurgitation.  No evidence of mitral valve stenosis. Tricuspid Valve: The tricuspid valve is normal in structure. Tricuspid valve regurgitation is trivial. No evidence of tricuspid stenosis. Aortic Valve: The aortic valve is tricuspid. Aortic valve regurgitation is trivial. No aortic stenosis is present. Pulmonic Valve: The pulmonic valve was grossly normal. Pulmonic valve regurgitation  is trivial. No evidence of pulmonic stenosis. Aorta: The aortic root, ascending aorta and aortic arch are all structurally normal, with no evidence of dilitation or obstruction. Venous: The inferior vena cava is normal in size with greater than 50% respiratory variability, suggesting right atrial pressure of 3 mmHg. IAS/Shunts: No atrial level shunt detected by color flow Doppler.  LEFT VENTRICLE PLAX 2D LVIDd:         3.27 cm  Diastology LVIDs:         2.20 cm  LV e' lateral:   10.70 cm/s LV PW:         1.81 cm  LV E/e' lateral: 7.2 LV IVS:        1.52 cm  LV e' medial:    6.64 cm/s LVOT diam:     1.90 cm  LV E/e' medial:  11.6 LV SV:         56 LV SV Index:   31 LVOT Area:     2.84 cm  RIGHT VENTRICLE RV S prime:     11.20 cm/s TAPSE (M-mode): 2.4 cm LEFT ATRIUM             Index       RIGHT ATRIUM           Index LA diam:        3.40 cm 1.86 cm/m  RA Area:     15.40 cm LA Vol (A2C):   34.3 ml 18.77 ml/m RA Volume:   35.60 ml  19.48 ml/m LA Vol (A4C):   31.3 ml 17.13 ml/m LA Biplane Vol: 33.9 ml 18.55 ml/m  AORTIC VALVE LVOT Vmax:   102.00 cm/s LVOT Vmean:  67.600 cm/s LVOT VTI:    0.197 m  AORTA Ao Root diam: 2.60 cm MITRAL VALVE MV Area (PHT): 3.31 cm    SHUNTS MV Decel Time: 229 msec    Systemic VTI:  0.20 m MV E velocity: 76.70 cm/s  Systemic Diam: 1.90 cm MV A velocity: 73.30 cm/s MV E/A ratio:  1.05 Buford Dresser MD  Electronically signed by Buford Dresser MD Signature Date/Time: 01/28/2020/8:29:27 PM    Final    US THYROID  Result Date: 01/28/2020 CLINICAL DATA:  Nodule.  Previous hemithyroidectomy. EXAM: THYROID ULTRASOUND TECHNIQUE: Ultrasound examination of the thyroid gland and adjacent soft tissues was performed. COMPARISON:  08/19/2013 FINDINGS: Parenchymal Echotexture: Mildly heterogenous Isthmus: 0.1 cm thickness Right lobe: 4.9 x 1.6 x 2 cm, previously 4.8 x 1.7 x 1.7 Left lobe: Surgically absent _________________________________________________________ Estimated total number of nodules >/= 1 cm: 0 Number of spongiform nodules >/=  2 cm not described below (TR1): 0 Number of mixed cystic and solid nodules >/= 1.5 cm not described below ([place]): 0 _________________________________________________________ Nodule # 1: 0.9 cm hyperechoic, inferior right, previously 1.1 cm; stability for greater than 5 years implies benignity; This nodule does NOT meet TI-RADS criteria for biopsy or dedicated follow-up. In Nodule # 2: 0.8 cm hypoechoic nodule without calcifications, superior right; This nodule does NOT meet TI-RADS criteria for biopsy or dedicated follow-up. Additional scattered subcentimeter benign colloid cysts and complex cysts which do not merit follow-up. IMPRESSION: 1. No residual/recurrent tissue post left hemithyroidectomy. 2. Stable scattered small right nodules and cysts. No indication for biopsy or dedicated imaging follow-up. The above is in keeping with the ACR TI-RADS recommendations - J Am Coll Radiol 2017;14:587-595. Electronically Signed   By: Lucrezia Europe M.D.   On: 01/28/2020 07:35      Flora Lipps, MD  [facility] 01/29/2020

## 2020-01-30 ENCOUNTER — Inpatient Hospital Stay (HOSPITAL_COMMUNITY): Payer: Medicare Other

## 2020-01-30 DIAGNOSIS — E785 Hyperlipidemia, unspecified: Secondary | ICD-10-CM

## 2020-01-30 DIAGNOSIS — E663 Overweight: Secondary | ICD-10-CM

## 2020-01-30 DIAGNOSIS — R079 Chest pain, unspecified: Secondary | ICD-10-CM

## 2020-01-30 DIAGNOSIS — I10 Essential (primary) hypertension: Secondary | ICD-10-CM

## 2020-01-30 LAB — NM MYOCAR MULTI W/SPECT W/WALL MOTION / EF
Estimated workload: 1 METS
Exercise duration (min): 0 min
Exercise duration (sec): 0 s
MPHR: 146 {beats}/min
Peak HR: 111 {beats}/min
Percent HR: 76 %
Rest HR: 85 {beats}/min

## 2020-01-30 LAB — CBC
HCT: 37 % (ref 36.0–46.0)
Hemoglobin: 12.2 g/dL (ref 12.0–15.0)
MCH: 28.2 pg (ref 26.0–34.0)
MCHC: 33 g/dL (ref 30.0–36.0)
MCV: 85.5 fL (ref 80.0–100.0)
Platelets: UNDETERMINED 10*3/uL (ref 150–400)
RBC: 4.33 MIL/uL (ref 3.87–5.11)
RDW: 17 % — ABNORMAL HIGH (ref 11.5–15.5)
WBC: 2.8 10*3/uL — ABNORMAL LOW (ref 4.0–10.5)
nRBC: 0 % (ref 0.0–0.2)

## 2020-01-30 LAB — COMPREHENSIVE METABOLIC PANEL
ALT: 14 U/L (ref 0–44)
AST: 16 U/L (ref 15–41)
Albumin: 3.2 g/dL — ABNORMAL LOW (ref 3.5–5.0)
Alkaline Phosphatase: 73 U/L (ref 38–126)
Anion gap: 13 (ref 5–15)
BUN: 11 mg/dL (ref 8–23)
CO2: 22 mmol/L (ref 22–32)
Calcium: 9.8 mg/dL (ref 8.9–10.3)
Chloride: 102 mmol/L (ref 98–111)
Creatinine, Ser: 0.8 mg/dL (ref 0.44–1.00)
GFR calc Af Amer: >60 mL/min (ref >60)
GFR calc non Af Amer: >60 mL/min (ref >60)
Glucose, Bld: 172 mg/dL — ABNORMAL HIGH (ref 70–99)
Potassium: 4.1 mmol/L (ref 3.5–5.1)
Sodium: 137 mmol/L (ref 135–145)
Total Bilirubin: 0.4 mg/dL (ref 0.3–1.2)
Total Protein: 7.2 g/dL (ref 6.5–8.1)

## 2020-01-30 LAB — HEPARIN LEVEL (UNFRACTIONATED)
Heparin Unfractionated: 0.72 IU/mL — ABNORMAL HIGH (ref 0.30–0.70)
Heparin Unfractionated: 0.49 IU/mL (ref 0.30–0.70)

## 2020-01-30 LAB — GLUCOSE, CAPILLARY
Glucose-Capillary: 155 mg/dL — ABNORMAL HIGH (ref 70–99)
Glucose-Capillary: 211 mg/dL — ABNORMAL HIGH (ref 70–99)

## 2020-01-30 LAB — MAGNESIUM: Magnesium: 2 mg/dL (ref 1.7–2.4)

## 2020-01-30 MED ORDER — TECHNETIUM TC 99M TETROFOSMIN IV KIT
32.5000 | PACK | Freq: Once | INTRAVENOUS | Status: AC | PRN
  Administered 2020-01-30: 32.5 via INTRAVENOUS

## 2020-01-30 MED ORDER — REGADENOSON 0.4 MG/5ML IV SOLN
INTRAVENOUS | Status: AC
  Filled 2020-01-30: qty 5

## 2020-01-30 MED ORDER — DILTIAZEM HCL ER COATED BEADS 120 MG PO TB24: 120.0000 mg | ORAL_TABLET | Freq: Every day | ORAL | 1 refills | Status: DC

## 2020-01-30 MED ORDER — REGADENOSON 0.4 MG/5ML IV SOLN
0.4000 mg | Freq: Once | INTRAVENOUS | Status: AC
  Administered 2020-01-30: 0.4 mg via INTRAVENOUS

## 2020-01-30 MED ORDER — APIXABAN 5 MG PO TABS: 5.0000 mg | ORAL_TABLET | Freq: Two times a day (BID) | ORAL | 3 refills | Status: DC

## 2020-01-30 MED ORDER — METOPROLOL TARTRATE 50 MG PO TABS: 50.0000 mg | ORAL_TABLET | Freq: Two times a day (BID) | ORAL | 1 refills | Status: DC

## 2020-01-30 MED ORDER — APIXABAN 5 MG PO TABS
5.0000 mg | ORAL_TABLET | Freq: Two times a day (BID) | ORAL | Status: DC
  Administered 2020-01-30: 5 mg via ORAL
  Filled 2020-01-30: qty 1

## 2020-01-30 MED ORDER — TECHNETIUM TC 99M TETROFOSMIN IV KIT
9.3000 | PACK | Freq: Once | INTRAVENOUS | Status: AC | PRN
  Administered 2020-01-30: 9.3 via INTRAVENOUS

## 2020-01-30 MED ORDER — APIXABAN 5 MG PO TABS: 5.0000 mg | ORAL_TABLET | Freq: Two times a day (BID) | ORAL | Status: DC

## 2020-01-30 MED FILL — ELIQUIS 5 MG TABLET: 5 | 30 days supply | Qty: 60 | Fill #0

## 2020-01-30 NOTE — Plan of Care (Signed)
  Problem: Education: Goal: Knowledge of disease or condition will improve Outcome: Progressing Goal: Understanding of medication regimen will improve Outcome: Progressing Goal: Individualized Educational Video(s) Outcome: Progressing   

## 2020-01-30 NOTE — Discharge Summary (Signed)
Discharge Summary  Kristen Murray [id number] DOB: 01-Mar-1946  PCP: [name], MD  Admit date: 01/26/2020 Discharge date: 01/30/2020  Time spent: 35 minutes  Recommendations for Outpatient Follow-up:  1. New medication: Eliquis 5 mg p.o. twice daily 2. New medication: Metoprolol 50 mg p.o. twice daily 3. New medication: Cardizem 120 daily  Discharge Diagnoses:  Active Hospital Problems   Diagnosis Date Noted  . Atrial flutter, paroxysmal ([place]) 01/26/2020  . Atrial flutter ([place]) 01/29/2020  . A-fib ([place]) 01/27/2020  . Chest pain at rest 11/26/2014    Resolved Hospital Problems  No resolved problems to display.    Discharge Condition: Improved, being discharged to home  Diet recommendation: Low-sodium, heart healthy  Vitals:   01/30/20 1250 01/30/20 1538  BP: 140/74 140/74  Pulse: (!) 107   Resp:    Temp:    SpO2:      History of present illness:  Kristen Murray a 74 y.o.femalewith past medical history of diabetes mellitus, iron deficiency anemia,migraines on verapamil, GERD,  presented to the hospital  on 3/31 with bilateral jaw pain and chest pain along with palpitations.  Patient did have bilateral jaw pain about 2 weeks ago came to [facility] ED, at that point she also had a sore throat and some cough, she was given antibiotics and sent home. Patient also was found to have leukopenia since late last year, and iron deficiency anemia, for which she received5 iron infusion in the oncologist office this year. She denied any abdominal pain, no history of melena, she never had EGD or colonoscopy. ED Course:At [facility] ED, EKG showed a flutter 2-1 transduction, Cardizem drip was started and was transferred to [facility].  Patient also received IV Lopressor and IV fluids.   Hospital Course:  Principal Problem:   Atrial flutter, paroxysmal [facility]): Initially sinus rhythm in the emergency room, then developed atrial flutter as high as 140s.  Initially on IV  Cardizem then changed over to p.o. metoprolol and Cardizem and since then has been maintaining sinus rhythm.  Following completion of work-up, transition to Eliquis 5 mg twice daily for stroke prophylaxis.  Has been tolerating p.o. medications and heart rate has been staying controlled.  She will follow-up with cardiology as outpatient. Active Problems:   Chest pain at rest: Initially presented with chest pain and bilateral jaw pain.  Possibly related to her tachyarrhythmia.  Patient underwent nuclear stress test which was unremarkable noting no evidence of ischemia. Essential hypertension: Blood pressure slightly elevated, will improve with metoprolol.  Diabetes mellitus type 2, almost well controlled: A1c at 7.9.  Follow-up with her PCP.  Overweight: Patient is criteria BMI greater than 25  Procedures:  Nuclear medicine stress test done 4/2: No evidence of ischemia  Consultations:  Cardiology  Discharge Exam: BP 140/74   Pulse (!) 107   Temp 98.1 F (36.7 C) (Oral)   Resp 18   Ht 5\' 5"  (1.651 m)   Wt 73.5 kg   LMP 07/30/1990   SpO2 98%   BMI 26.96 kg/m   General: Alert and oriented x3, no acute distress Cardiovascular: Regular rate and rhythm, currently normal sinus, S1-S2 Respiratory: Clear to auscultation bilaterally  Discharge Instructions You were cared for by a hospitalist during your hospital stay. If you have any questions about your discharge medications or the care you received while you were in the hospital after you are discharged, you can call the unit and asked to speak with the hospitalist on call  if the hospitalist that took care of you is not available. Once you are discharged, your primary care physician will handle any further medical issues. Please note that NO REFILLS for any discharge medications will be authorized once you are discharged, as it is imperative that you return to your primary care physician (or establish a relationship with a primary care  physician if you do not have one) for your aftercare needs so that they can reassess your need for medications and monitor your lab values.  Discharge Instructions    Diet - low sodium heart healthy   Complete by: As directed    Increase activity slowly   Complete by: As directed      Allergies as of 01/30/2020      Reactions   Codeine Hives, Other (See Comments)   Because of a history of documented adverse serious drug reaction, Medi Alert bracelet  is recommended   Macrodantin [nitrofurantoin Macrocrystal] Other (See Comments)   Numbness, aching all over   Other Shortness Of Breath, Rash, Other (See Comments)   Pine nuts & walnuts throat congestion. No documented angioedema. Avocado- Rash   Propoxyphene Hives, Swelling, Rash   Propoxyphene Hcl Hives, Swelling, Rash   Latex Rash   Tizanidine Other (See Comments)   Reaction not recalled   Banana Rash   Ciprofloxacin Nausea Only, Other (See Comments)   Nausea (Note: Patient takes this when on mission trips, however)   Tramadol Nausea Only      Medication List    STOP taking these medications   ibuprofen 600 MG tablet Commonly known as: ADVIL     TAKE these medications   acetaminophen 325 MG tablet Commonly known as: TYLENOL Take 325-650 mg by mouth every 6 (six) hours as needed (for pain).   apixaban 5 MG Tabs tablet Commonly known as: ELIQUIS Take 1 tablet (5 mg total) by mouth 2 (two) times daily.   cyclobenzaprine 10 MG tablet Commonly known as: FLEXERIL Take 2.5 mg by mouth daily as needed for muscle spasms.   diltiazem 120 MG 24 hr tablet Commonly known as: CARDIZEM LA Take 1 tablet (120 mg total) by mouth daily.   fluorometholone 0.1 % ophthalmic suspension Commonly known as: FML Place 1 drop into both eyes every Monday, Wednesday, and Friday.   fluticasone 50 MCG/ACT nasal spray Commonly known as: FLONASE Place 1 spray into both nostrils 2 (two) times daily.   freestyle lancets Check blood sugar once  daily as directed DX:790.29 (FREESTYLE FREEDOM LITE)   glucose blood test strip Commonly known as: FREESTYLE LITE CHECK BLOOD SUGAR ONCE DAILY AS DIRECTED.  DX:790.29   ipratropium 0.06 % nasal spray Commonly known as: ATROVENT Place 1-2 sprays into both nostrils every 6 (six) hours as needed for rhinitis.   Januvia 100 MG tablet Generic drug: sitaGLIPtin Take 100 mg by mouth daily.   loratadine 10 MG tablet Commonly known as: CLARITIN Take 1 tablet (10 mg total) by mouth daily. What changed: how much to take   metoprolol tartrate 50 MG tablet Commonly known as: LOPRESSOR Take 1 tablet (50 mg total) by mouth 2 (two) times daily.   montelukast 10 MG tablet Commonly known as: SINGULAIR Take 1 tablet (10 mg total) by mouth at bedtime.   multivitamin tablet Take 1 tablet by mouth daily. Shaklee brand   pantoprazole 40 MG tablet Commonly known as: PROTONIX Take 40 mg by mouth 2 (two) times daily.   rosuvastatin 5 MG tablet Commonly known as: CRESTOR Take  5 mg by mouth See admin instructions. Take 5 mg by mouth at bedtime on Mondays and Thursdays   verapamil 180 MG CR tablet Commonly known as: CALAN-SR Take 180 mg by mouth at bedtime.      Allergies  Allergen Reactions  . Codeine Hives and Other (See Comments)    Because of a history of documented adverse serious drug reaction, Medi Alert bracelet  is recommended  . Macrodantin [Nitrofurantoin Macrocrystal] Other (See Comments)    Numbness, aching all over  . Other Shortness Of Breath, Rash and Other (See Comments)    Pine nuts & walnuts throat congestion. No documented angioedema. Avocado- Rash  . Propoxyphene Hives, Swelling and Rash  . Propoxyphene Hcl Hives, Swelling and Rash  . Latex Rash  . Tizanidine Other (See Comments)    Reaction not recalled  . Banana Rash  . 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 Tramadol Nausea Only     Follow-up Information    Richardson Dopp T, PA-C Follow up.   Specialties: Cardiology, Physician Assistant Why: Hospital follow-up scheduled for 02/11/2020 at 11:15am with Richardson Dopp, one of Dr. Elmarie Shiley PAs. If this date/time does not work for you, please call our office to reschedule.  Contact information: [id number] [address] [place] [address] [phone]            The results of significant diagnostics from this hospitalization (including imaging, microbiology, ancillary and laboratory) are listed below for reference.    Significant Diagnostic Studies: DG Chest 2 View  Result Date: 01/26/2020 CLINICAL DATA:  Chest pain EXAM: CHEST - 2 VIEW COMPARISON:  01/10/2020 FINDINGS: Lungs are clear.  No pleural effusion or pneumothorax. The heart is normal in size. Visualized osseous structures are within normal limits. IMPRESSION: Normal chest radiographs. Electronically Signed   By: Julian Hy M.D.   On: 01/26/2020 19:12   CT ANGIO CHEST PE W OR WO CONTRAST  Result Date: 01/26/2020 CLINICAL DATA:  Chest pain. EXAM: CT ANGIOGRAPHY CHEST WITH CONTRAST TECHNIQUE: Multidetector CT imaging of the chest was performed using the standard protocol during bolus administration of intravenous contrast. Multiplanar CT image reconstructions and MIPs were obtained to evaluate the vascular anatomy. CONTRAST:  143mL OMNIPAQUE IOHEXOL 350 MG/ML SOLN COMPARISON:  None. FINDINGS: Cardiovascular: Contrast injection is sufficient to demonstrate satisfactory opacification of the pulmonary arteries to the segmental level. There is no pulmonary embolus. The main pulmonary artery is within normal limits for size. The heart size is normal. There is no significant pericardial effusion. There is no evidence for thoracic aortic dissection or aneurysm. The arch vessels are grossly patent. The left vertebral artery arises directly from the aortic arch. Mediastinum/Nodes: --No mediastinal or hilar  lymphadenopathy. --No axillary lymphadenopathy. --No supraclavicular lymphadenopathy. --the patient is status post left hemithyroidectomy. There is a right-sided thyroid nodule measuring approximately 1.6 cm (axial series 4, image 3). --The esophagus is unremarkable Lungs/Pleura: There are innumerable small pulmonary nodules bilaterally. For example there is a 3 mm pulmonary nodule in the right lower lobe (axial series 5, image 56). There are 2-3 mm pulmonary nodules in the posterior left lower lobe (axial series 5, image 35). There is no pneumothorax or large pleural effusion. There is no focal infiltrate. Upper Abdomen: There are multiple hypoattenuating structures throughout the liver favored to represent hepatic cysts, however many of them are too small to characterize on this study. There is no  acute abnormality detected within the upper abdomen. Musculoskeletal: No chest wall abnormality. No acute or significant osseous findings. Review of the MIP images confirms the above findings. IMPRESSION: 1. No acute pulmonary embolism. 2. No evidence for thoracic aortic dissection. 3. There are innumerable small pulmonary nodules bilaterally measuring less than 6 mm. No follow-up needed if patient is low-risk (and has no known or suspected primary neoplasm). Non-contrast chest CT can be considered in 12 months if patient is high-risk. This recommendation follows the consensus statement: Guidelines for Management of Incidental Pulmonary Nodules Detected on CT Images: From the Fleischner Society 2017; Radiology 2017; 284:228-243. 4. Status post left-sided hemithyroidectomy. There is a right-sided thyroid nodule measuring approximately 1.6 cm. Recommend thyroid US(Ref: J Am Coll Radiol. 2015 Feb;12(2): 143-50). 5. Multiple small hypoattenuating structures are noted in the patient's liver. Some of these are too small to characterize but are still favored to represent hepatic cysts. Consider further evaluation with a  nonemergent outpatient ultrasound. Electronically Signed   By: Constance Holster M.D.   On: 01/26/2020 19:49   NM Myocar Multi W/Spect W/Wall Motion / EF  Result Date: 01/30/2020 CLINICAL DATA:  Chest pain EXAM: MYOCARDIAL IMAGING WITH SPECT (REST AND PHARMACOLOGIC-STRESS) GATED LEFT VENTRICULAR WALL MOTION STUDY LEFT VENTRICULAR EJECTION FRACTION TECHNIQUE: Standard myocardial SPECT imaging was performed after resting intravenous injection of 9.3 mCi Tc-71m sestamibi. Subsequently, intravenous infusion of Lexiscan was performed under the supervision of the Cardiology staff. At peak effect of the drug, 32.5 mCi Tc-6m sestamibi was injected intravenously and standard myocardial SPECT imaging was performed. Quantitative gated imaging was also performed to evaluate left ventricular wall motion, and estimate left ventricular ejection fraction. COMPARISON:  06/06/2019 FINDINGS: Perfusion: No decreased activity in the left ventricle on stress imaging to suggest reversible ischemia or infarction. Wall Motion: Normal left ventricular wall motion. No left ventricular dilation. Left Ventricular Ejection Fraction: 76 % End diastolic volume 65 ml End systolic volume 16 ml IMPRESSION: 1. No reversible ischemia or infarction. 2. Normal left ventricular wall motion. 3. Left ventricular ejection fraction 76% 4. Non invasive risk stratification*: Low *2012 Appropriate Use Criteria for Coronary Revascularization Focused Update: J Am Coll Cardiol. [id number]. http://content.airportbarriers.com.aspx?articleid=1201161 Electronically Signed   By: Lucrezia Europe M.D.   On: 01/30/2020 13:46   DG Chest Portable 1 View  Result Date: 01/10/2020 CLINICAL DATA:  Shortness of breath and cough EXAM: PORTABLE CHEST 1 VIEW COMPARISON:  05/30/2018 FINDINGS: The cardiomediastinal silhouette is unremarkable. There is no evidence of focal airspace disease, pulmonary edema, suspicious pulmonary nodule/mass, pleural effusion, or  pneumothorax. No acute bony abnormalities are identified. IMPRESSION: No active disease. Electronically Signed   By: Margarette Canada M.D.   On: 01/10/2020 11:43   ECHOCARDIOGRAM COMPLETE  Result Date: 01/28/2020    ECHOCARDIOGRAM REPORT   Patient Name:   Kristen Murray Date of Exam: 01/28/2020 Medical Rec #:  [id number]     Height:       65.0 in Accession #:    [id number]    Weight:       165.9 lb Date of Birth:  1946-02-16      BSA:          1.827 m Patient Age:    12 years      BP:           128/68 mmHg Patient Gender: F             HR:           94 bpm.  Exam Location:  Inpatient Procedure: 2D Echo, Cardiac Doppler and Color Doppler Indications:    786.50 chest pain  History:        Patient has prior history of Echocardiogram examinations, most                 recent 11/27/2014. Risk Factors:Dyslipidemia and Diabetes.  Sonographer:    Jannett Celestine RDCS (AE) Referring Phys: [place]  1. Left ventricular ejection fraction, by estimation, is 60 to 65%. The left ventricle has normal function. The left ventricle has no regional wall motion abnormalities. There is moderate concentric left ventricular hypertrophy. Left ventricular diastolic parameters are indeterminate.  2. Right ventricular systolic function is normal. The right ventricular size is normal.  3. The mitral valve is normal in structure. Trivial mitral valve regurgitation. No evidence of mitral stenosis.  4. The aortic valve is tricuspid. Aortic valve regurgitation is trivial. No aortic stenosis is present.  5. The inferior vena cava is normal in size with greater than 50% respiratory variability, suggesting right atrial pressure of 3 mmHg. Comparison(s): No significant change from prior study. FINDINGS  Left Ventricle: Left ventricular ejection fraction, by estimation, is 60 to 65%. The left ventricle has normal function. The left ventricle has no regional wall motion abnormalities. The left ventricular internal cavity size was  normal in size. There is  moderate concentric left ventricular hypertrophy. Left ventricular diastolic parameters are indeterminate. Right Ventricle: The right ventricular size is normal. No increase in right ventricular wall thickness. Right ventricular systolic function is normal. Left Atrium: Left atrial size was normal in size. Right Atrium: Right atrial size was normal in size. Pericardium: There is no evidence of pericardial effusion. Mitral Valve: The mitral valve is normal in structure. There is mild thickening of the mitral valve leaflet(s). There is mild calcification of the mitral valve leaflet(s). Mild to moderate mitral annular calcification. Trivial mitral valve regurgitation.  No evidence of mitral valve stenosis. Tricuspid Valve: The tricuspid valve is normal in structure. Tricuspid valve regurgitation is trivial. No evidence of tricuspid stenosis. Aortic Valve: The aortic valve is tricuspid. Aortic valve regurgitation is trivial. No aortic stenosis is present. Pulmonic Valve: The pulmonic valve was grossly normal. Pulmonic valve regurgitation is trivial. No evidence of pulmonic stenosis. Aorta: The aortic root, ascending aorta and aortic arch are all structurally normal, with no evidence of dilitation or obstruction. Venous: The inferior vena cava is normal in size with greater than 50% respiratory variability, suggesting right atrial pressure of 3 mmHg. IAS/Shunts: No atrial level shunt detected by color flow Doppler.  LEFT VENTRICLE PLAX 2D LVIDd:         3.27 cm  Diastology LVIDs:         2.20 cm  LV e' lateral:   10.70 cm/s LV PW:         1.81 cm  LV E/e' lateral: 7.2 LV IVS:        1.52 cm  LV e' medial:    6.64 cm/s LVOT diam:     1.90 cm  LV E/e' medial:  11.6 LV SV:         56 LV SV Index:   31 LVOT Area:     2.84 cm  RIGHT VENTRICLE RV S prime:     11.20 cm/s TAPSE (M-mode): 2.4 cm LEFT ATRIUM             Index       RIGHT ATRIUM  Index LA diam:        3.40 cm 1.86 cm/m  RA Area:      15.40 cm LA Vol (A2C):   34.3 ml 18.77 ml/m RA Volume:   35.60 ml  19.48 ml/m LA Vol (A4C):   31.3 ml 17.13 ml/m LA Biplane Vol: 33.9 ml 18.55 ml/m  AORTIC VALVE LVOT Vmax:   102.00 cm/s LVOT Vmean:  67.600 cm/s LVOT VTI:    0.197 m  AORTA Ao Root diam: 2.60 cm MITRAL VALVE MV Area (PHT): 3.31 cm    SHUNTS MV Decel Time: 229 msec    Systemic VTI:  0.20 m MV E velocity: 76.70 cm/s  Systemic Diam: 1.90 cm MV A velocity: 73.30 cm/s MV E/A ratio:  1.05 Buford Dresser MD Electronically signed by Buford Dresser MD Signature Date/Time: 01/28/2020/8:29:27 PM    Final    US THYROID  Result Date: 01/28/2020 CLINICAL DATA:  Nodule.  Previous hemithyroidectomy. EXAM: THYROID ULTRASOUND TECHNIQUE: Ultrasound examination of the thyroid gland and adjacent soft tissues was performed. COMPARISON:  08/19/2013 FINDINGS: Parenchymal Echotexture: Mildly heterogenous Isthmus: 0.1 cm thickness Right lobe: 4.9 x 1.6 x 2 cm, previously 4.8 x 1.7 x 1.7 Left lobe: Surgically absent _________________________________________________________ Estimated total number of nodules >/= 1 cm: 0 Number of spongiform nodules >/=  2 cm not described below (TR1): 0 Number of mixed cystic and solid nodules >/= 1.5 cm not described below ([place]): 0 _________________________________________________________ Nodule # 1: 0.9 cm hyperechoic, inferior right, previously 1.1 cm; stability for greater than 5 years implies benignity; This nodule does NOT meet TI-RADS criteria for biopsy or dedicated follow-up. In Nodule # 2: 0.8 cm hypoechoic nodule without calcifications, superior right; This nodule does NOT meet TI-RADS criteria for biopsy or dedicated follow-up. Additional scattered subcentimeter benign colloid cysts and complex cysts which do not merit follow-up. IMPRESSION: 1. No residual/recurrent tissue post left hemithyroidectomy. 2. Stable scattered small right nodules and cysts. No indication for biopsy or dedicated imaging follow-up.  The above is in keeping with the ACR TI-RADS recommendations - J Am Coll Radiol 2017;14:587-595. Electronically Signed   By: Lucrezia Europe M.D.   On: 01/28/2020 07:35    Microbiology: Recent Results (from the past 240 hour(s))  SARS CORONAVIRUS 2 (TAT 6-24 HRS) Nasopharyngeal Nasopharyngeal Swab     Status: None   Collection Time: 01/26/20  9:40 PM   Specimen: Nasopharyngeal Swab  Result Value Ref Range Status   SARS Coronavirus 2 NEGATIVE NEGATIVE Final    Comment: (NOTE) SARS-CoV-2 target nucleic acids are NOT DETECTED. The SARS-CoV-2 RNA is generally detectable in upper and lower respiratory specimens during the acute phase of infection. Negative results do not preclude SARS-CoV-2 infection, do not rule out co-infections with other pathogens, and should not be used as the sole basis for treatment or other patient management decisions. Negative results must be combined with clinical observations, patient history, and epidemiological information. The expected result is Negative. Fact Sheet for Patients: SugarRoll.be Fact Sheet for Healthcare Providers: https://www.woods-mathews.com/ This test is not yet approved or cleared by the [place] FDA and  has been authorized for detection and/or diagnosis of SARS-CoV-2 by FDA under an Emergency Use Authorization (EUA). This EUA will remain  in effect (meaning this test can be used) for the duration of the COVID-19 declaration under Section 56 4(b)(1) of the Act, 21 U.S.C. section 360bbb-3(b)(1), unless the authorization is terminated or revoked sooner. Performed at [facility], [place] [address] [place], [place] [address]  Labs: Basic Metabolic Panel: Recent Labs  Lab 01/26/20 1826 01/28/20 0251 01/30/20 0524  NA 138 133* 137  K 4.4 3.8 4.1  CL 102 101 102  CO2 27 25 22   GLUCOSE 209* 182* 172*  BUN 17 13 11   CREATININE 0.84 0.62 0.80  CALCIUM 9.3 9.0 9.8  MG 2.1  --  2.0     Liver Function Tests: Recent Labs  Lab 01/30/20 0524  AST 16  ALT 14  ALKPHOS 73  BILITOT 0.4  PROT 7.2  ALBUMIN 3.2*   No results for input(s): LIPASE, AMYLASE in the last 168 hours. No results for input(s): AMMONIA in the last 168 hours. CBC: Recent Labs  Lab 01/26/20 1826 01/28/20 0251 01/29/20 0410 01/30/20 0524  WBC 3.5* 2.8* 3.1* 2.8*  HGB 12.4 11.2* 11.9* 12.2  HCT 37.9 35.3* 36.5 37.0  MCV 87.1 87.6 85.7 85.5  PLT 194 194 195 PLATELET CLUMPS NOTED ON SMEAR, UNABLE TO ESTIMATE   Cardiac Enzymes: No results for input(s): CKTOTAL, CKMB, CKMBINDEX, TROPONINI in the last 168 hours. BNP: BNP (last 3 results) No results for input(s): BNP in the last 8760 hours.  ProBNP (last 3 results) No results for input(s): PROBNP in the last 8760 hours.  CBG: Recent Labs  Lab 01/29/20 1144 01/29/20 1622 01/29/20 2203 01/30/20 0802 01/30/20 1211  GLUCAP 126* 178* 152* 155* 211*       Signed:  Annita Brod, MD [facility] 01/30/2020, 6:54 PM

## 2020-01-30 NOTE — Progress Notes (Signed)
Progress Note  Patient Name: Kristen Murray Date of Encounter: 01/30/2020  Primary Cardiologist: No primary care provider on file.   Subjective   She is still feeling fatigued. Occasional palpitations. She did have some jaw pain with the stress test which has now resolved. No recurrent chest pain or SOB.   Inpatient Medications    Scheduled Meds: . aspirin EC  81 mg Oral Daily  . diltiazem  60 mg Oral Q8H  . fluticasone  1 spray Each Nare BID  . insulin aspart  0-9 Units Subcutaneous TID WC  . ipratropium  1-2 spray Each Nare TID  . loratadine  10 mg Oral Daily  . metoprolol tartrate  50 mg Oral BID  . montelukast  10 mg Oral QHS  . pantoprazole  40 mg Oral BID  . regadenoson      . sodium chloride flush  3 mL Intravenous Once   Continuous Infusions: . heparin 850 Units/hr (01/30/20 0648)   PRN Meds: acetaminophen **OR** acetaminophen, cyclobenzaprine, metoprolol tartrate   Vital Signs    Vitals:   01/30/20 1038 01/30/20 1048 01/30/20 1050 01/30/20 1052  BP: (!) 142/73 (!) 148/73 (!) 143/68 134/66  Pulse:      Resp:      Temp:      TempSrc:      SpO2:      Weight:      Height:        Intake/Output Summary (Last 24 hours) at 01/30/2020 1118 Last data filed at 01/30/2020 0600 Gross per 24 hour  Intake 356.01 ml  Output 750 ml  Net -393.99 ml   Filed Weights   01/28/20 0513 01/29/20 0422 01/30/20 0630  Weight: 75.3 kg 74.9 kg 73.5 kg    Telemetry    Unable to review in Nuc Med - Personally Reviewed  ECG    Sinus rhythm with rate 85 bpm, no STE/D - Personally Reviewed  Physical Exam   GEN: No acute distress.   Neck: No JVD, no carotid bruits Cardiac: RRR, no murmurs, rubs, or gallops.  Respiratory: Clear to auscultation bilaterally, no wheezes/ rales/ rhonchi GI: NABS, Soft, nontender, non-distended  MS: No edema; No deformity. Neuro:  Nonfocal, moving all extremities spontaneously Psych: Normal affect   Labs    Chemistry Recent Labs  Lab  01/26/20 1826 01/28/20 0251 01/30/20 0524  NA 138 133* 137  K 4.4 3.8 4.1  CL 102 101 102  CO2 27 25 22   GLUCOSE 209* 182* 172*  BUN 17 13 11   CREATININE 0.84 0.62 0.80  CALCIUM 9.3 9.0 9.8  PROT  --   --  7.2  ALBUMIN  --   --  3.2*  AST  --   --  16  ALT  --   --  14  ALKPHOS  --   --  73  BILITOT  --   --  0.4  GFRNONAA >60 >60 >60  GFRAA >60 >60 >60  ANIONGAP 9 7 13      Hematology Recent Labs  Lab 01/28/20 0251 01/29/20 0410 01/30/20 0524  WBC 2.8* 3.1* 2.8*  RBC 4.03 4.26 4.33  HGB 11.2* 11.9* 12.2  HCT 35.3* 36.5 37.0  MCV 87.6 85.7 85.5  MCH 27.8 27.9 28.2  MCHC 31.7 32.6 33.0  RDW 17.2* 16.8* 17.0*  PLT 194 195 PLATELET CLUMPS NOTED ON SMEAR, UNABLE TO ESTIMATE    Cardiac EnzymesNo results for input(s): TROPONINI in the last 168 hours. No results for input(s): TROPIPOC in  the last 168 hours.   BNPNo results for input(s): BNP, PROBNP in the last 168 hours.   DDimer  Recent Labs  Lab 01/26/20 1826  DDIMER 1.22*     Radiology    ECHOCARDIOGRAM COMPLETE  Result Date: 01/28/2020    ECHOCARDIOGRAM REPORT   Patient Name:   Kristen Murray Date of Exam: 01/28/2020 Medical Rec #:  [id number]     Height:       65.0 in Accession #:    [id number]    Weight:       165.9 lb Date of Birth:  1946/04/17      BSA:          1.827 m Patient Age:    74 years      BP:           128/68 mmHg Patient Gender: F             HR:           94 bpm. Exam Location:  Inpatient Procedure: 2D Echo, Cardiac Doppler and Color Doppler Indications:    786.50 chest pain  History:        Patient has prior history of Echocardiogram examinations, most                 recent 11/27/2014. Risk Factors:Dyslipidemia and Diabetes.  Sonographer:    Jannett Celestine RDCS (AE) Referring Phys: [place]  1. Left ventricular ejection fraction, by estimation, is 60 to 65%. The left ventricle has normal function. The left ventricle has no regional wall motion abnormalities. There is moderate  concentric left ventricular hypertrophy. Left ventricular diastolic parameters are indeterminate.  2. Right ventricular systolic function is normal. The right ventricular size is normal.  3. The mitral valve is normal in structure. Trivial mitral valve regurgitation. No evidence of mitral stenosis.  4. The aortic valve is tricuspid. Aortic valve regurgitation is trivial. No aortic stenosis is present.  5. The inferior vena cava is normal in size with greater than 50% respiratory variability, suggesting right atrial pressure of 3 mmHg. Comparison(s): No significant change from prior study. FINDINGS  Left Ventricle: Left ventricular ejection fraction, by estimation, is 60 to 65%. The left ventricle has normal function. The left ventricle has no regional wall motion abnormalities. The left ventricular internal cavity size was normal in size. There is  moderate concentric left ventricular hypertrophy. Left ventricular diastolic parameters are indeterminate. Right Ventricle: The right ventricular size is normal. No increase in right ventricular wall thickness. Right ventricular systolic function is normal. Left Atrium: Left atrial size was normal in size. Right Atrium: Right atrial size was normal in size. Pericardium: There is no evidence of pericardial effusion. Mitral Valve: The mitral valve is normal in structure. There is mild thickening of the mitral valve leaflet(s). There is mild calcification of the mitral valve leaflet(s). Mild to moderate mitral annular calcification. Trivial mitral valve regurgitation.  No evidence of mitral valve stenosis. Tricuspid Valve: The tricuspid valve is normal in structure. Tricuspid valve regurgitation is trivial. No evidence of tricuspid stenosis. Aortic Valve: The aortic valve is tricuspid. Aortic valve regurgitation is trivial. No aortic stenosis is present. Pulmonic Valve: The pulmonic valve was grossly normal. Pulmonic valve regurgitation is trivial. No evidence of pulmonic  stenosis. Aorta: The aortic root, ascending aorta and aortic arch are all structurally normal, with no evidence of dilitation or obstruction. Venous: The inferior vena cava is normal in size with greater than 50% respiratory  variability, suggesting right atrial pressure of 3 mmHg. IAS/Shunts: No atrial level shunt detected by color flow Doppler.  LEFT VENTRICLE PLAX 2D LVIDd:         3.27 cm  Diastology LVIDs:         2.20 cm  LV e' lateral:   10.70 cm/s LV PW:         1.81 cm  LV E/e' lateral: 7.2 LV IVS:        1.52 cm  LV e' medial:    6.64 cm/s LVOT diam:     1.90 cm  LV E/e' medial:  11.6 LV SV:         56 LV SV Index:   31 LVOT Area:     2.84 cm  RIGHT VENTRICLE RV S prime:     11.20 cm/s TAPSE (M-mode): 2.4 cm LEFT ATRIUM             Index       RIGHT ATRIUM           Index LA diam:        3.40 cm 1.86 cm/m  RA Area:     15.40 cm LA Vol (A2C):   34.3 ml 18.77 ml/m RA Volume:   35.60 ml  19.48 ml/m LA Vol (A4C):   31.3 ml 17.13 ml/m LA Biplane Vol: 33.9 ml 18.55 ml/m  AORTIC VALVE LVOT Vmax:   102.00 cm/s LVOT Vmean:  67.600 cm/s LVOT VTI:    0.197 m  AORTA Ao Root diam: 2.60 cm MITRAL VALVE MV Area (PHT): 3.31 cm    SHUNTS MV Decel Time: 229 msec    Systemic VTI:  0.20 m MV E velocity: 76.70 cm/s  Systemic Diam: 1.90 cm MV A velocity: 73.30 cm/s MV E/A ratio:  1.05 Buford Dresser MD Electronically signed by Buford Dresser MD Signature Date/Time: 01/28/2020/8:29:27 PM    Final     Cardiac Studies   Echocardiogram 01/28/20: 1. Left ventricular ejection fraction, by estimation, is 60 to 65%. The  left ventricle has normal function. The left ventricle has no regional  wall motion abnormalities. There is moderate concentric left ventricular  hypertrophy. Left ventricular  diastolic parameters are indeterminate.  2. Right ventricular systolic function is normal. The right ventricular  size is normal.  3. The mitral valve is normal in structure. Trivial mitral valve   regurgitation. No evidence of mitral stenosis.  4. The aortic valve is tricuspid. Aortic valve regurgitation is trivial.  No aortic stenosis is present.  5. The inferior vena cava is normal in size with greater than 50%  respiratory variability, suggesting right atrial pressure of 3 mmHg.   Patient Profile     74 y.o. female with PMH of HTN, HLD, DM type 2, GERD, and iron deficiency anemia, who presented with chest pain and bilateral jaw pain with associated palpitations. She was found to be in new onset atrial flutter, for which cardiology is following  Assessment & Plan    1. New onset atrial flutter: paroxysmal episodes this admission. Initially SR in the ED, then developed atrial flutter with rates in the 140s. Placed on IV>po diltiazem and po metoprolol for rate control and has been maintaining sinus rhythm. She was started on IV heparin for stroke ppx until work-up complete with plans to transition to eliquis 5mg  BID. Unable to review telemetry at this time as she was seen in [facility] this morning, HR's in the 80s-100s on flow sheet review.  - Could  consider uptitration of medications given room in HR and BP - Consolidate diltiazem dosing prior to discharge - Anticipate transition to eliquis prior to discharge for stroke ppx  2. Chest pain: initially presented with chest pain and bilateral jaw pain. Felt to be related to her tachyarrhythmia. Initially planned for coronary CTA, however, HR control limited this option. She was seen at the time of her NST today. Did have recurrent jaw pain with this study, though no dynamic EKG changes at that time. LDL is 103 this admission. If NST abnormal, would consider starting atorvastatin for risk factor modifications. - Will follow-up NST read to determine if further work-up is necessary.  - Continue aspirin - Continue metoprolol  3. HTN: BP intermittently elevated - Managed in the context of #1  4. DM type 2: A1C 7.9 this admission; goal <7  -  Continue management per primary team      For questions or updates, please contact [facility] Please consult [email or web address] for contact info under Cardiology/STEMI.      Signed, Abigail Butts, PA-C  01/30/2020, 11:18 AM   [phone]

## 2020-01-30 NOTE — Evaluation (Addendum)
Physical Therapy Evaluation Patient Details Name: Kristen Murray MRN: [id number] DOB: Jul 03, 1946 Today's Date: 01/30/2020   History of Present Illness  Patient is a 74 y/o female who presents with palpitations, chest pain and jaw pain. EKG- A-flutter.  PMH includes DM, fibromyalgia, HTN, HLD  Clinical Impression  Patient presents with decreased activity tolerance, impaired balance and impaired mobility s/p above. Pt is independent and lives with spouse PTA. Today, pt tolerated transfers and ambulation with supervision for safety with pt holding onto IV pole for support. VSS on RA with HR in 90s and Sp02 >95%. Reports no palpitations, just feeling tired post stress test. Education re: short bouts of activity with longer rest breaks at home. Encouraged walking 2-3 times per day with nursing. Will need to negotiate stairs prior to d/c. Will follow acutely to maximize independence and mobility prior to return home.    Follow Up Recommendations No PT follow up;Supervision - Intermittent    Equipment Recommendations  None recommended by PT    Recommendations for Other Services       Precautions / Restrictions Precautions Precautions: None Restrictions Weight Bearing Restrictions: No      Mobility  Bed Mobility Overal bed mobility: Modified Independent             General bed mobility comments: HOB elevated.  Transfers Overall transfer level: Modified independent Equipment used: None             General transfer comment: Stood from EOB x1, no assist needed.  Ambulation/Gait Ambulation/Gait assistance: Supervision Gait Distance (Feet): 400 Feet Assistive device: IV Pole Gait Pattern/deviations: Step-through pattern;Decreased stride length   Gait velocity interpretation: <1.31 ft/sec, indicative of household ambulator General Gait Details: Slow, steady gait holding onto IV pole for support. HR stayed in 90s. Sp02 >95% on RA. 2 standing rest breaks.  Stairs             Wheelchair Mobility    Modified Rankin (Stroke Patients Only)       Balance Overall balance assessment: Mild deficits observed, not formally tested                                           Pertinent Vitals/Pain Pain Assessment: Faces Faces Pain Scale: Hurts a little bit Pain Location: RUE Pain Descriptors / Indicators: Tender Pain Intervention(s): Monitored during session    Home Living Family/patient expects to be discharged to:: Private residence Living Arrangements: Spouse/significant other Available Help at Discharge: Family;Available 24 hours/day Type of Home: House Home Access: Stairs to enter Entrance Stairs-Rails: Right Entrance Stairs-Number of Steps: 7-8  or 5-6 steps Home Layout: Two level Home Equipment: Shower seat - built in;Walker - 2 wheels;Cane - single point      Prior Function Level of Independence: Independent         Comments: Does ADLs and IADLs. Drives. Plays with grandkids. Reports 1 fall last October when playing kick with her grandkids.     Hand Dominance   Dominant Hand: Right    Extremity/Trunk Assessment   Upper Extremity Assessment Upper Extremity Assessment: Defer to OT evaluation    Lower Extremity Assessment Lower Extremity Assessment: Overall WFL for tasks assessed    Cervical / Trunk Assessment Cervical / Trunk Assessment: Normal  Communication   Communication: No difficulties  Cognition Arousal/Alertness: Awake/alert Behavior During Therapy: WFL for tasks assessed/performed Overall Cognitive Status: Within Functional Limits  for tasks assessed                                        General Comments General comments (skin integrity, edema, etc.): Spouse present during session,. Some palpable knots in right UE from infiltrated IV.    Exercises     Assessment/Plan    PT Assessment Patient needs continued PT services  PT Problem List Cardiopulmonary status limiting  activity;Decreased activity tolerance;Pain;Decreased balance       PT Treatment Interventions Therapeutic activities;Therapeutic exercise;Patient/family education;Stair training;Functional mobility training    PT Goals (Current goals can be found in the Care Plan section)  Acute Rehab PT Goals Patient Stated Goal: to feel better and go home PT Goal Formulation: With patient Time For Goal Achievement: 02/13/20 Potential to Achieve Goals: Good    Frequency Min 3X/week   Barriers to discharge Inaccessible home environment stairs    Co-evaluation               AM-PAC PT "6 Clicks" Mobility  Outcome Measure Help needed turning from your back to your side while in a flat bed without using bedrails?: None Help needed moving from lying on your back to sitting on the side of a flat bed without using bedrails?: None Help needed moving to and from a bed to a chair (including a wheelchair)?: None Help needed standing up from a chair using your arms (e.g., wheelchair or bedside chair)?: None Help needed to walk in hospital room?: None Help needed climbing 3-5 steps with a railing? : A Little 6 Click Score: 23    End of Session Equipment Utilized During Treatment: Gait belt Activity Tolerance: Patient tolerated treatment well Patient left: in bed;with call bell/phone within reach;with family/visitor present Nurse Communication: Mobility status PT Visit Diagnosis: Difficulty in walking, not elsewhere classified (R26.2)    Time: [id number] PT Time Calculation (min) (ACUTE ONLY): 22 min   Charges:   PT Evaluation $PT Eval Moderate Complexity: 1 Mod          Marisa Severin, PT, DPT Acute Rehabilitation Services Pager [phone] Office [phone]      Marguarite Arbour A Sabra Heck 01/30/2020, 2:59 PM

## 2020-01-30 NOTE — Progress Notes (Signed)
   Carmie Kanner presented for a nuclear stress test today.  No immediate complications.  Stress imaging is pending at this time.  Preliminary EKG findings may be listed in the chart, but the stress test result will not be finalized until perfusion imaging is complete.  One day study, [facility] Radiology to read.  Abigail Butts, PA-C 01/30/2020, 11:09 AM

## 2020-01-30 NOTE — Progress Notes (Addendum)
[place] for heparin Indication: atrial fibrillation  Allergies  Allergen Reactions  . Codeine Hives and Other (See Comments)    Because of a history of documented adverse serious drug reaction, Medi Alert bracelet  is recommended  . Macrodantin [Nitrofurantoin Macrocrystal] Other (See Comments)    Numbness, aching all over  . Other Shortness Of Breath, Rash and Other (See Comments)    Pine nuts & walnuts throat congestion. No documented angioedema. Avocado- Rash  . Propoxyphene Hives, Swelling and Rash  . Propoxyphene Hcl Hives, Swelling and Rash  . Latex Rash  . Tizanidine Other (See Comments)    Reaction not recalled  . Banana Rash  . 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 Tramadol Nausea Only    Patient Measurements: Height: 5\' 5"  (165.1 cm) Weight: 73.5 kg (162 lb 0.6 oz) IBW/kg (Calculated) : 57 Heparin Dosing Weight: 75kg  Vital Signs: Temp: 98.1 F (36.7 C) (04/02 0630) Temp Source: Oral (04/02 0630) BP: 140/74 (04/02 1250) Pulse Rate: 107 (04/02 1250)  Labs: Recent Labs    01/28/20 0251 01/28/20 2312 01/29/20 0410 01/29/20 0410 01/29/20 1933 01/30/20 0524 01/30/20 1214  HGB 11.2*  --  11.9*  --   --  12.2  --   HCT 35.3*  --  36.5  --   --  37.0  --   PLT 194  --  195  --   --  PLATELET CLUMPS NOTED ON SMEAR, UNABLE TO ESTIMATE  --   HEPARINUNFRC  --    < > 0.79*   < > 0.58 0.72* 0.49  CREATININE 0.62  --   --   --   --  0.80  --    < > = values in this interval not displayed.    Estimated Creatinine Clearance: 61.9 mL/min (by C-G formula based on SCr of 0.8 mg/dL).   Medical History: Past Medical History:  Diagnosis Date  . Arthritis   . Borderline abnormal TFTs    as a teen  . Diabetes mellitus without complication ([place])   . E. coli UTI 10/05/12   [facility]; resistant only to Septra DS & tetracycline  . Eye infection    . Fibromyalgia   . GERD (gastroesophageal reflux disease)   . Headache(784.0)   . 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 HSV-1 infection   . HTN (hypertension)   . Hyperglycemia   . Hyperlipidemia   . Meningioma ([place])   . Nephrolithiasis    Dr Amalia Hailey, [facility], Hx of [3][  . Polyp of colon   . Radiculopathy      Assessment: 79 yoF admitted with palpitations found to have new AFL. Pharmacy asked to start IV heparin, CHADSVASc 3. No AC PTA, hx of IDA but Hgb stable at 11 on admit.   Heparin level this morning is therapeutic on 850 units/hr. No bleeding issues noted. CBC remained stable overnight.   Goal of Therapy:  Heparin level 0.3-0.7 units/ml Monitor platelets by anticoagulation protocol: Yes   Plan:  -Continue heparin at 850 units/hr -Daily heparin level and CBC  Erin Hearing PharmD., BCPS Clinical Pharmacist 01/30/2020 2:38 PM  Addendum: Orders to start apixaban this afternoon. Patient does not meet any criteria for dose adjustments.   Plan  Apixaban 5mg  bid

## 2020-01-30 NOTE — Progress Notes (Signed)
ANTICOAGULATION CONSULT NOTE - Follow Up Consult  Pharmacy Consult for heparin Indication: atrial fibrillation  Labs: Recent Labs    01/28/20 0251 01/28/20 2312 01/29/20 0410 01/29/20 1933 01/30/20 0524  HGB 11.2*  --  11.9*  --   --   HCT 35.3*  --  36.5  --   --   PLT 194  --  195  --   --   HEPARINUNFRC  --    < > 0.79* 0.58 0.72*  CREATININE 0.62  --   --   --  0.80   < > = values in this interval not displayed.    Assessment: 74yo female supratherapeutic on heparin after one level at goal; no gtt issues or signs of bleeding per RN.  Goal of Therapy:  Heparin level 0.3-0.7 units/ml   Plan:  Will decrease heparin gtt by ~1 units/kg/hr to 850 units/hr and check level in 6 hours.    Wynona Neat, PharmD, BCPS  01/30/2020,6:23 AM

## 2020-01-30 NOTE — Plan of Care (Signed)
  Problem: Education: Goal: Knowledge of disease or condition will improve Outcome: Progressing   Problem: Activity: Goal: Ability to tolerate increased activity will improve Outcome: Progressing   

## 2020-01-30 NOTE — Discharge Instructions (Addendum)

## 2020-02-10 NOTE — Progress Notes (Signed)
Cardiology Office Note:    Date:  02/11/2020   ID:  Kristen Murray, DOB [birth year], MRN [id number]  PCP:  [name], MD  Cardiologist:  Mertie Moores, MD   Electrophysiologist:  None   Referring MD: [name], MD   Chief Complaint:  Hospitalization Follow-up (Admx w/ Atrial Flutter)    Patient Profile:    Kristen Murray is a 74 y.o. female with:   Atrial fibrillation/flutter  CHA2DS2-VASc=4 (female, HTN, Diab, age x 1)   Admitted 12/2019; Apixaban started; conv to NSR w/ Dilt  Diabetes mellitus  Hyperlipidemia (managed by PCP)  Hypertension  GERD  Iron deficiency anemia  Migraine HAs  Verapamil; followed by Neuro at [facility])  Prior CV studies: Myoview 01/30/2020 EF 76, no ischemia or infarction, low risk  Echocardiogram 01/28/2020 EF 60-65, no RWMA, moderate LVH, normal RV SF, trivial MR, trivial AI  Myoview 06/06/2019 Perfusion normal, EF 70, low risk  History of Present Illness:    [name] was admitted 3/29-4/2 with Atrial Flutter.  She presented with chest pain and jaw pain along with palpitations.  She converted to normal sinus rhythm with Diltiazem.  Echocardiogram demonstrated normal LV function.  She was scheduled for coronary CTA but her heart rate was too high.  Therefore, Myoview was obtained and was neg for ischemia.   She returns for follow-up.  She is here alone.  Since discharge from the hospital, she has not had further chest discomfort.  She does have some dyspnea with moderate activities.  She has not had orthopnea, significant leg swelling or syncope.  She does feel tired.  However, she was fatigued prior to this episode.  She has not had any rapid palpitations.        Past Medical History:  Diagnosis Date  . Arthritis   . Borderline abnormal TFTs    as a teen  . Diabetes mellitus without complication ([place])   . E. coli UTI 10/05/12   [facility]; resistant only to Septra DS & tetracycline  . Eye infection   . Fibromyalgia   . GERD  (gastroesophageal reflux disease)   . Headache(784.0)   . 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 HSV-1 infection   . HTN (hypertension)   . Hyperglycemia   . Hyperlipidemia   . Meningioma ([place])   . Nephrolithiasis    Dr Amalia Hailey, [facility], Hx of [3][  . Polyp of colon   . Radiculopathy     Current Medications: Current Meds  Medication Sig  . acetaminophen (TYLENOL) 325 MG tablet Take 325-650 mg by mouth every 6 (six) hours as needed (for pain).  Marland Kitchen apixaban (ELIQUIS) 5 MG TABS tablet Take 1 tablet (5 mg total) by mouth 2 (two) times daily.  . cyclobenzaprine (FLEXERIL) 10 MG tablet Take 2.5 mg by mouth daily as needed for muscle spasms.   . fluorometholone (FML) 0.1 % ophthalmic suspension Place 1 drop into both eyes every Monday, Wednesday, and Friday.   . fluticasone (FLONASE) 50 MCG/ACT nasal spray Place 1 spray into both nostrils 2 (two) times daily.  Marland Kitchen glucose blood (FREESTYLE LITE) test strip CHECK BLOOD SUGAR ONCE DAILY AS DIRECTED.  DX:790.29  . ipratropium (ATROVENT) 0.06 % nasal spray Place 1-2 sprays into both nostrils every 6 (six) hours as needed for rhinitis.  Marland Kitchen JANUVIA 100 MG tablet Take 100 mg by mouth daily.  . Lancets (FREESTYLE) lancets Check blood sugar once daily as directed DX:790.29 (FREESTYLE FREEDOM LITE)  .  loratadine (CLARITIN) 10 MG tablet Take 1 tablet (10 mg total) by mouth daily.  . metoprolol tartrate (LOPRESSOR) 50 MG tablet Take 1.5 tablets (75 mg total) by mouth 2 (two) times daily.  . montelukast (SINGULAIR) 10 MG tablet Take 1 tablet (10 mg total) by mouth at bedtime.  . Multiple Vitamin (MULTIVITAMIN) tablet Take 1 tablet by mouth daily. [place]aklee brand  . pantoprazole (PROTONIX) 40 MG tablet Take 40 mg by mouth 2 (two) times daily.  . verapamil (CALAN-SR) 180 MG CR tablet Take 180 mg by mouth at bedtime.   . [DISCONTINUED] diltiazem (CARDIZEM LA) 120 MG 24 hr tablet Take 1 tablet (120 mg total) by mouth daily.  .  [DISCONTINUED] metoprolol tartrate (LOPRESSOR) 50 MG tablet Take 1 tablet (50 mg total) by mouth 2 (two) times daily.     Allergies:   Codeine, Macrodantin [nitrofurantoin macrocrystal], Other, Propoxyphene, Propoxyphene hcl, Latex, Banana, Ciprofloxacin, Tizanidine, and Tramadol   Social History   Tobacco Use  . Smoking status: Never Smoker  . Smokeless tobacco: Never Used  Substance Use Topics  . Alcohol use: No    Alcohol/week: 0.0 standard drinks  . Drug use: No     Family Hx: The patient's family history includes Alcohol abuse in her mother; Diabetes in her maternal aunt, maternal grandmother, and maternal uncle; Other in her father; Tuberculosis in her maternal uncle. There is no history of Coronary artery disease.  ROS   EKGs/Labs/Other Test Reviewed:    EKG:  EKG is  ordered today.  The ekg ordered today demonstrates normal sinus rhythm, heart rate 67, normal axis, no ST-T wave changes, QTC 397  Recent Labs: 01/27/2020: TSH 3.229 01/30/2020: ALT 14; BUN 11; Creatinine, Ser 0.80; Hemoglobin 12.2; Magnesium 2.0; Platelets PLATELET CLUMPS NOTED ON SMEAR, UNABLE TO ESTIMATE; Potassium 4.1; Sodium 137   Recent Lipid Panel Lab Results  Component Value Date/Time   CHOL 164 01/27/2020 06:35 PM   TRIG 85 01/27/2020 06:35 PM   HDL 44 01/27/2020 06:35 PM   CHOLHDL 3.7 01/27/2020 06:35 PM   LDLCALC 103 (H) 01/27/2020 06:35 PM    Physical Exam:    VS:  BP 120/62   Pulse 67   Ht 5\' 5"  (1.651 m)   Wt 169 lb 9.6 oz (76.9 kg)   LMP 07/30/1990   SpO2 95%   BMI 28.22 kg/m     Wt Readings from Last 3 Encounters:  02/11/20 169 lb 9.6 oz (76.9 kg)  01/30/20 162 lb 0.6 oz (73.5 kg)  01/10/20 161 lb (73 kg)     Constitutional:      Appearance: Healthy appearance. Not in distress.  Neck:     Thyroid: No thyromegaly.     Vascular: JVD normal.  Pulmonary:     Effort: Pulmonary effort is normal.     Breath sounds: No wheezing. No rales.  Cardiovascular:     Normal rate.  Regular rhythm. Normal S1. Normal S2.     Murmurs: There is no murmur.  Edema:    Peripheral edema absent.  Abdominal:     Palpations: Abdomen is soft. There is no hepatomegaly.  Skin:    General: Skin is warm and dry.  Neurological:     Mental Status: Alert and oriented to person, place and time.     Cranial Nerves: Cranial nerves are intact.       ASSESSMENT & PLAN:    1. Atrial flutter, paroxysmal ([place]) She was noted to have atrial fibrillation/flutter in the hospital.  She converted to sinus rhythm on diltiazem.  She is maintaining normal sinus rhythm.  She is tolerating anticoagulation.  She is now on diltiazem and verapamil as well as metoprolol tartrate.  She has been on verapamil for a long time for migraine prophylaxis.  Therefore, I have decided to continue her on verapamil.  I will stop her diltiazem and increase her metoprolol.  -Continue apixaban 5 mg twice daily  -DC diltiazem  -Increase metoprolol tartrate to 75 mg twice daily  -BMET, CBC 4 weeks   -Follow-up 3 months  2. Essential hypertension The patient's blood pressure is controlled on her current regimen.  Adjust medications as outlined above.  3. Hx of migraine headaches As noted, she has a history of migraine headaches which are controlled on verapamil therapy.  Therefore, have decided to keep her on verapamil and stop diltiazem.  Hopefully the addition of beta-blocker therapy will also help prevent headaches.  4. Snoring She does note a history of snoring as well as daytime hypersomnolence.  Now that she has atrial fibrillation, I will order a home sleep study to rule out sleep apnea.  5.  Iron deficiency anemia She has not experienced any bleeding issues.  As she is on Apixaban now, I will obtain a follow-up CBC in 4 weeks.  6.  Exertional chest pain She noted exertional chest pain and jaw pain upon admission.  High-sensitivity troponins were normal.  Her HR was too fast for Coronary CTA.  A Myoview was  neg for ischemia.  I have encouraged her to increase her activity, slowly.  If she has recurrent chest or jaw discomfort with exertion, we may need to reconsider coronary CTA.  Her heart rate is now in the 60s.    Dispo:  Return in about 3 months (around 05/12/2020) for Routine Follow Up, w/ Dr. Acie Fredrickson, or Richardson Dopp, PA-C, in person.   Medication Adjustments/Labs and Tests Ordered: Current medicines are reviewed at length with the patient today.  Concerns regarding medicines are outlined above.  Tests Ordered: Orders Placed This Encounter  Procedures  . Basic metabolic panel  . CBC  . Ambulatory referral to Sleep Studies  . EKG 12-Lead   Medication Changes: Meds ordered this encounter  Medications  . metoprolol tartrate (LOPRESSOR) 50 MG tablet    Sig: Take 1.5 tablets (75 mg total) by mouth 2 (two) times daily.    Dispense:  90 tablet    Refill:  2    Signed, Richardson Dopp, PA-C  02/11/2020 12:09 PM    [address], [place], [place]  [address] Phone: [phone]; Fax: [phone]

## 2020-02-11 ENCOUNTER — Other Ambulatory Visit: Payer: Self-pay

## 2020-02-11 ENCOUNTER — Encounter: Payer: Self-pay | Admitting: Physician Assistant

## 2020-02-11 ENCOUNTER — Ambulatory Visit: Payer: Medicare Other | Admitting: Physician Assistant

## 2020-02-11 VITALS — BP 120/62 | HR 67 | Ht 65.0 in | Wt 169.6 lb

## 2020-02-11 DIAGNOSIS — I4892 Unspecified atrial flutter: Secondary | ICD-10-CM | POA: Diagnosis not present

## 2020-02-11 DIAGNOSIS — R079 Chest pain, unspecified: Secondary | ICD-10-CM

## 2020-02-11 DIAGNOSIS — D508 Other iron deficiency anemias: Secondary | ICD-10-CM

## 2020-02-11 DIAGNOSIS — R0683 Snoring: Secondary | ICD-10-CM

## 2020-02-11 DIAGNOSIS — Z8669 Personal history of other diseases of the nervous system and sense organs: Secondary | ICD-10-CM

## 2020-02-11 DIAGNOSIS — I10 Essential (primary) hypertension: Secondary | ICD-10-CM | POA: Diagnosis not present

## 2020-02-11 MED ORDER — METOPROLOL TARTRATE 50 MG PO TABS: 75.0000 mg | ORAL_TABLET | Freq: Two times a day (BID) | ORAL | 2 refills | Status: DC

## 2020-02-11 NOTE — Patient Instructions (Addendum)
Medication Instructions:   Your physician has recommended you make the following change in your medication:   1) Stop Diltiazem 2) Increase Metoprolol to 75 mg, 1.5 tablets by mouth twice a day  *If you need a refill on your cardiac medications before your next appointment, please call your pharmacy*  Lab Work:  Your physician recommends that you return for lab work in 4 weeks on 03/10/20 at 11:30AM  If you have labs (blood work) drawn today and your tests are completely normal, you will receive your results only by: Marland Kitchen MyChart Message (if you have MyChart) OR . A paper copy in the mail If you have any lab test that is abnormal or we need to change your treatment, we will call you to review the results.  Testing/Procedures:  Your physician has recommended that you have a sleep study. This test records several body functions during sleep, including: brain activity, eye movement, oxygen and carbon dioxide blood levels, heart rate and rhythm, breathing rate and rhythm, the flow of air through your mouth and nose, snoring, body muscle movements, and chest and belly movement.  Follow-Up: At [facility], you and your health needs are our priority.  As part of our continuing mission to provide you with exceptional heart care, we have created designated Provider Care Teams.  These Care Teams include your primary Cardiologist (physician) and Advanced Practice Providers (APPs -  Physician Assistants and Nurse Practitioners) who all work together to provide you with the care you need, when you need it.  We recommend signing up for the patient portal called "MyChart".  Sign up information is provided on this After Visit Summary.  MyChart is used to connect with patients for Virtual Visits (Telemedicine).  Patients are able to view lab/test results, encounter notes, upcoming appointments, etc.  Non-urgent messages can be sent to your provider as well.   To learn more about what you can do with MyChart,  go to [email or web address].    Your next appointment:   3 month(s)  The format for your next appointment:   In Person  Provider:   You may see Mertie Moores, MD or Richardson Dopp, PA-C  Other Instructions   Heart-Healthy Eating Plan Many factors influence your heart (coronary) health, including eating and exercise habits. Coronary risk increases with abnormal blood fat (lipid) levels. Heart-healthy meal planning includes limiting unhealthy fats, increasing healthy fats, and making other diet and lifestyle changes. What is my plan? Your health care provider may recommend that you:  Limit your fat intake to _________% or less of your total calories each day.  Limit your saturated fat intake to _________% or less of your total calories each day.  Limit the amount of cholesterol in your diet to less than _________ mg per day. What are tips for following this plan? Cooking Cook foods using methods other than frying. Baking, boiling, grilling, and broiling are all good options. Other ways to reduce fat include:  Removing the skin from poultry.  Removing all visible fats from meats.  Steaming vegetables in water or broth. Meal planning   At meals, imagine dividing your plate into fourths: ? Fill one-half of your plate with vegetables and green salads. ? Fill one-fourth of your plate with whole grains. ? Fill one-fourth of your plate with lean protein foods.  Eat 4-5 servings of vegetables per day. One serving equals 1 cup raw or cooked vegetable, or 2 cups raw leafy greens.  Eat 4-5 servings of fruit per day. One  serving equals 1 medium whole fruit,  cup dried fruit,  cup fresh, frozen, or canned fruit, or  cup 100% fruit juice.  Eat more foods that contain soluble fiber. Examples include apples, broccoli, carrots, beans, peas, and barley. Aim to get 25-30 g of fiber per day.  Increase your consumption of legumes, nuts, and seeds to 4-5 servings per week. One serving of  dried beans or legumes equals  cup cooked, 1 serving of nuts is  cup, and 1 serving of seeds equals 1 tablespoon. Fats  Choose healthy fats more often. Choose monounsaturated and polyunsaturated fats, such as olive and canola oils, flaxseeds, walnuts, almonds, and seeds.  Eat more omega-3 fats. Choose salmon, mackerel, sardines, tuna, flaxseed oil, and ground flaxseeds. Aim to eat fish at least 2 times each week.  Check food labels carefully to identify foods with trans fats or high amounts of saturated fat.  Limit saturated fats. These are found in animal products, such as meats, butter, and cream. Plant sources of saturated fats include palm oil, palm kernel oil, and coconut oil.  Avoid foods with partially hydrogenated oils in them. These contain trans fats. Examples are stick margarine, some tub margarines, cookies, crackers, and other baked goods.  Avoid fried foods. General information  Eat more home-cooked food and less restaurant, buffet, and fast food.  Limit or avoid alcohol.  Limit foods that are high in starch and sugar.  Lose weight if you are overweight. Losing just 5-10% of your body weight can help your overall health and prevent diseases such as diabetes and heart disease.  Monitor your salt (sodium) intake, especially if you have high blood pressure. Talk with your health care provider about your sodium intake.  Try to incorporate more vegetarian meals weekly. What foods can I eat? Fruits All fresh, canned (in natural juice), or frozen fruits. Vegetables Fresh or frozen vegetables (raw, steamed, roasted, or grilled). Green salads. Grains Most grains. Choose whole wheat and whole grains most of the time. Rice and pasta, including brown rice and pastas made with whole wheat. Meats and other proteins Lean, well-trimmed beef, veal, pork, and lamb. Chicken and [place] without skin. All fish and shellfish. Wild duck, rabbit, pheasant, and venison. Egg whites or  low-cholesterol egg substitutes. Dried beans, peas, lentils, and tofu. Seeds and most nuts. Dairy Low-fat or nonfat cheeses, including ricotta and mozzarella. Skim or 1% milk (liquid, powdered, or evaporated). Buttermilk made with low-fat milk. Nonfat or low-fat yogurt. Fats and oils Non-hydrogenated (trans-free) margarines. Vegetable oils, including soybean, sesame, sunflower, olive, peanut, safflower, corn, canola, and cottonseed. Salad dressings or mayonnaise made with a vegetable oil. Beverages Water (mineral or sparkling). Coffee and tea. Diet carbonated beverages. Sweets and desserts Sherbet, gelatin, and fruit ice. Small amounts of dark chocolate. Limit all sweets and desserts. Seasonings and condiments All seasonings and condiments. The items listed above may not be a complete list of foods and beverages you can eat. Contact a dietitian for more options. What foods are not recommended? Fruits Canned fruit in heavy syrup. Fruit in cream or butter sauce. Fried fruit. Limit coconut. Vegetables Vegetables cooked in cheese, cream, or butter sauce. Fried vegetables. Grains Breads made with saturated or trans fats, oils, or whole milk. Croissants. Sweet rolls. Donuts. High-fat crackers, such as cheese crackers. Meats and other proteins Fatty meats, such as hot dogs, ribs, sausage, bacon, rib-eye roast or steak. High-fat deli meats, such as salami and bologna. Caviar. Domestic duck and goose. Organ meats, such as liver.  Dairy Cream, sour cream, cream cheese, and creamed cottage cheese. Whole milk cheeses. Whole or 2% milk (liquid, evaporated, or condensed). Whole buttermilk. Cream sauce or high-fat cheese sauce. Whole-milk yogurt. Fats and oils Meat fat, or shortening. Cocoa butter, hydrogenated oils, palm oil, coconut oil, palm kernel oil. Solid fats and shortenings, including bacon fat, salt pork, lard, and butter. Nondairy cream substitutes. Salad dressings with cheese or sour  cream. Beverages Regular sodas and any drinks with added sugar. Sweets and desserts Frosting. Pudding. Cookies. Cakes. Pies. Milk chocolate or white chocolate. Buttered syrups. Full-fat ice cream or ice cream drinks. The items listed above may not be a complete list of foods and beverages to avoid. Contact a dietitian for more information. Summary  Heart-healthy meal planning includes limiting unhealthy fats, increasing healthy fats, and making other diet and lifestyle changes.  Lose weight if you are overweight. Losing just 5-10% of your body weight can help your overall health and prevent diseases such as diabetes and heart disease.  Focus on eating a balance of foods, including fruits and vegetables, low-fat or nonfat dairy, lean protein, nuts and legumes, whole grains, and heart-healthy oils and fats. This information is not intended to replace advice given to you by your health care provider. Make sure you discuss any questions you have with your health care provider. Document Revised: 11/23/2017 Document Reviewed: 11/23/2017 Elsevier Patient Education  2020 [employer].

## 2020-02-19 ENCOUNTER — Telehealth: Payer: Self-pay | Admitting: Cardiovascular Disease

## 2020-02-19 NOTE — Telephone Encounter (Signed)
Spoke with patient who states she is having a core needle biopsy of her breast and wants to know if she should hold her Eliquis. I advised her to contact the provider that is doing the procedure to determine if they request that she hold it. She states she reported this information to their office and she thinks she understood them to say that she did not have to hold Eliquis. I explained the reason that we would not advise her to stop the medication unless necessary and she verbalized understanding.  She also reports some fatigue, especially with going up stairs, since being diagnosed with a fib 3 weeks ago. She was started on metoprolol 75 mg on 4/14 in addition to her verapamil. I explained that this medication could cause some additional fatigue and that the a fib could also cause SOB especially with activity. I advised her to continue the current treatment plan since this is a new diagnosis and treatment plan and to call back if her symptoms do not improve in another week or so. She verbalized understanding and agreement with plan and thanked me for the call.

## 2020-02-19 NOTE — Telephone Encounter (Signed)
Patient is having a core needle biopsy of her left breast. She wants to know if this is safe since she is on eliquis. Will she need surgical clearance? She states she is scheduled for 03/08/20 but it can be moved. You can call her mobile number if she does not answer her home phone. You may also leave a VM on her home phone.

## 2020-02-24 ENCOUNTER — Encounter: Payer: Self-pay | Admitting: Gynecology

## 2020-02-25 ENCOUNTER — Telehealth: Payer: Self-pay | Admitting: Physician Assistant

## 2020-02-25 NOTE — Telephone Encounter (Signed)
New Message    Patient c/o Palpitations:  High priority if patient c/o lightheadedness, shortness of breath, or chest pain  1) How long have you had palpitations/irregular HR/ Afib? Are you having the symptoms now? No chest fluttering symptoms right now but her ankle is swelling and left arm is hurting very bad.   2) Are you currently experiencing lightheadedness, SOB or CP? No   3) Do you have a history of afib (atrial fibrillation) or irregular heart rhythm? Pt says she was in the hospital March 29th and they diagnosed her with A flutter   4) Have you checked your BP or HR? (document readings if available): Has not checked them this morning   5) Are you experiencing any other symptoms? Pt is calling with concerns of feeling a flutter in her chest last night. She also says she has been having left arm pain that hurts really bad, she says it is about 2 inches about the arm and her arm feels cool on the outside. She also says 2 of her fingers turned cold. She is noticing some swelling in one of her ankles.    Please call

## 2020-02-25 NOTE — Telephone Encounter (Signed)
Pt reports left arm soreness, "like I hit my funny bone but I didn't".  Reports it measures about 3 inches down from funny bone and it is "very sore".  States she was doing some mopping over weekend, something she had not done in awhile, and wonders if this contributed. She put Voltaren on it, didn't help.  She put Aspercreme on it, improved some. Reports ring & pinky finger were cold yesterday. Left ankle swollen when she went to bed last night but no swelling this morning. Reports hx of fibromyalgia & arthritis. She also reports "fluttering" yesterday evening about 5/6.  No CP, resolved quickly.  Pt is going to discuss concerns w/ PCP and have further evaluated. She will call office if she experiences more episodes with fluttering and/or symptoms occur w/ episodes. Patient verbalized understanding and agreeable to plan.

## 2020-03-09 ENCOUNTER — Ambulatory Visit: Payer: Medicare Other | Admitting: Allergy and Immunology

## 2020-03-10 ENCOUNTER — Other Ambulatory Visit: Payer: Self-pay

## 2020-03-10 ENCOUNTER — Other Ambulatory Visit: Payer: Medicare Other | Admitting: *Deleted

## 2020-03-10 DIAGNOSIS — I4892 Unspecified atrial flutter: Secondary | ICD-10-CM

## 2020-03-10 LAB — BASIC METABOLIC PANEL
BUN/Creatinine Ratio: 14 (ref 12–28)
BUN: 10 mg/dL (ref 8–27)
CO2: 27 mmol/L (ref 20–29)
Calcium: 9.4 mg/dL (ref 8.7–10.3)
Chloride: 101 mmol/L (ref 96–106)
Creatinine, Ser: 0.74 mg/dL (ref 0.57–1.00)
GFR calc Af Amer: 92 mL/min/{1.73_m2} (ref >59)
GFR calc non Af Amer: 80 mL/min/{1.73_m2} (ref >59)
Glucose: 152 mg/dL — ABNORMAL HIGH (ref 65–99)
Potassium: 4.5 mmol/L (ref 3.5–5.2)
Sodium: 138 mmol/L (ref 134–144)

## 2020-03-11 LAB — CBC
Hematocrit: 35.9 % (ref 34.0–46.6)
Hemoglobin: 12 g/dL (ref 11.1–15.9)
MCH: 28.6 pg (ref 26.6–33.0)
MCHC: 33.4 g/dL (ref 31.5–35.7)
MCV: 86 fL (ref 79–97)
Platelets: 221 10*3/uL (ref 150–450)
RBC: 4.19 x10E6/uL (ref 3.77–5.28)
RDW: 15.5 % — ABNORMAL HIGH (ref 11.7–15.4)
WBC: 2.7 10*3/uL — ABNORMAL LOW (ref 3.4–10.8)

## 2020-03-17 ENCOUNTER — Other Ambulatory Visit: Payer: Self-pay | Admitting: Radiology

## 2020-03-24 ENCOUNTER — Encounter: Payer: Self-pay | Admitting: Gynecology

## 2020-04-13 ENCOUNTER — Telehealth: Payer: Self-pay | Admitting: Hematology & Oncology

## 2020-04-13 ENCOUNTER — Other Ambulatory Visit: Payer: Self-pay

## 2020-04-13 ENCOUNTER — Ambulatory Visit: Payer: Medicare Other | Admitting: Nurse Practitioner

## 2020-04-13 ENCOUNTER — Encounter: Payer: Self-pay | Admitting: Nurse Practitioner

## 2020-04-13 VITALS — BP 132/78

## 2020-04-13 DIAGNOSIS — N898 Other specified noninflammatory disorders of vagina: Secondary | ICD-10-CM | POA: Diagnosis not present

## 2020-04-13 DIAGNOSIS — N760 Acute vaginitis: Secondary | ICD-10-CM

## 2020-04-13 DIAGNOSIS — R350 Frequency of micturition: Secondary | ICD-10-CM

## 2020-04-13 DIAGNOSIS — B373 Candidiasis of vulva and vagina: Secondary | ICD-10-CM | POA: Diagnosis not present

## 2020-04-13 DIAGNOSIS — B3731 Acute candidiasis of vulva and vagina: Secondary | ICD-10-CM

## 2020-04-13 LAB — WET PREP FOR TRICH, YEAST, CLUE

## 2020-04-13 LAB — URINALYSIS, COMPLETE W/RFL CULTURE
Bacteria, UA: NONE SEEN /HPF
Bilirubin Urine: NEGATIVE
Glucose, UA: NEGATIVE
Hgb urine dipstick: NEGATIVE
Hyaline Cast: NONE SEEN /LPF
Leukocyte Esterase: NEGATIVE
Nitrites, Initial: NEGATIVE
Protein, ur: NEGATIVE
RBC / HPF: NONE SEEN /HPF (ref 0–2)
Specific Gravity, Urine: 1.02 (ref 1.001–1.03)
WBC, UA: NONE SEEN /HPF (ref 0–5)
pH: 5.5 (ref 5.0–8.0)

## 2020-04-13 LAB — NO CULTURE INDICATED

## 2020-04-13 MED ORDER — FLUCONAZOLE 150 MG PO TABS: 150.0000 mg | ORAL_TABLET | Freq: Once | ORAL | 0 refills | Status: AC

## 2020-04-13 MED ORDER — HYDROCORTISONE 1 % EX OINT: 1.0000 "application " | TOPICAL_OINTMENT | Freq: Two times a day (BID) | CUTANEOUS | 0 refills | Status: DC

## 2020-04-13 NOTE — Telephone Encounter (Signed)
Ennever on PAL 7/15 updated letter/appt info mailed   

## 2020-04-13 NOTE — Progress Notes (Signed)
   Acute Office Visit  Subjective:    Patient ID: Kristen Murray, female    DOB: [birth year], 74 y.o.   MRN: [id number]  Chief Complaint  Patient presents with  . Vaginal Itching    JK  . Vaginal Discharge  . Urinary Frequency    HPI 74 year old presents today for urinary frequency, vaginal discharge, and odor that began 5 days ago. Took OTC monistat x 3 days, last dose last night, with some relief but symptoms returned. Non-bleeding hemorrhoid that bothers her at times. 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    Review of Systems  Gastrointestinal: Negative.   Genitourinary: Positive for frequency and vaginal discharge. Negative for dysuria, hematuria and urgency.       Vaginal itching, odor       Objective:    Physical Exam Constitutional:      Appearance: Normal appearance.  Abdominal:     General: Abdomen is flat.     Palpations: Abdomen is soft.     Tenderness: There is no right CVA tenderness or left CVA tenderness.  Genitourinary:    General: Normal vulva.     Labia:        Right: No rash, tenderness or lesion.        Left: No rash, tenderness or lesion.      Vagina: Vaginal discharge (small amount of thick white) present. No erythema.     Cervix: Normal.     Uterus: Normal.        BP 132/78 (BP Location: Right Arm, Patient Position: Sitting, Cuff Size: Normal)   LMP 07/30/1990  Wt Readings from Last 3 Encounters:  02/11/20 169 lb 9.6 oz (76.9 kg)  01/30/20 162 lb 0.6 oz (73.5 kg)  01/10/20 161 lb (73 kg)   UA: negative Wet prep negative     Assessment & Plan:   Problem List Items Addressed This Visit    None    Visit Diagnoses    Vaginal itching    -  Primary   Relevant Orders   WET PREP FOR TRICH, YEAST, CLUE   Vulvovaginal candidiasis       Relevant Medications   fluconazole (DIFLUCAN) 150 MG tablet   Acute vaginitis       Relevant Medications   hydrocortisone 1 % ointment     Plan: UA unremarkable. Frequency  most likely related to vulvovaginal irritation. Wet prep negative, may be false negative due to recent Monistat use. Diflucan 150 mg x 1 dose. Hydrocortisone 1% ointment to external vagina twice a day for 14 days. Recommend OTC preparation H for external hemorrhoid. Follow up if symptoms persist. Pt agreeable to plan.      [place], 4:13 PM 04/13/2020

## 2020-05-04 ENCOUNTER — Encounter: Payer: Self-pay | Admitting: Allergy and Immunology

## 2020-05-04 ENCOUNTER — Other Ambulatory Visit: Payer: Self-pay

## 2020-05-04 ENCOUNTER — Ambulatory Visit: Payer: Medicare Other | Admitting: Allergy and Immunology

## 2020-05-04 VITALS — BP 118/74 | HR 61 | Resp 16 | Ht 65.0 in | Wt 166.0 lb

## 2020-05-04 DIAGNOSIS — K219 Gastro-esophageal reflux disease without esophagitis: Secondary | ICD-10-CM

## 2020-05-04 DIAGNOSIS — T7805XD Anaphylactic reaction due to tree nuts and seeds, subsequent encounter: Secondary | ICD-10-CM | POA: Diagnosis not present

## 2020-05-04 DIAGNOSIS — J3089 Other allergic rhinitis: Secondary | ICD-10-CM | POA: Diagnosis not present

## 2020-05-04 NOTE — Patient Instructions (Addendum)
  1. Continue avoidance - tree nuts, pine nuts, banana, avocado  2. Continue to Treat and prevent inflammation:   A. Flonase 1-2 sprays each nostril one time per day  B. montelukast 10 mg tablet once a day  3. Continue to Treat LPR:   A. continue to consolidate all caffeine and chocolate use  B. can add OTC Tums if needed  C. Continue to replace throat clearing with swallowing maneuver  D. Continue Pantoprazole prescribed by Dr. Laurann Montana  4. If needed:   A. nasal saline  B. OTC antihistamine  C. Epi-Pen  D. Can add OTC Mucinex two times per day  E.  Ipratropium 0.06% 1-2 sprays each nostril every 6 hours to dry nose  5. Obtain COVID vaccine and fall flu vaccine  6. Return to clinic in 1 year or earlier if problem

## 2020-05-04 NOTE — Progress Notes (Signed)
[place]   Follow-up Note  Referring Provider: [name], MD Primary Provider: [name], MD Date of Office Visit: 05/04/2020  Subjective:   Kristen Murray (DOB: 06-14-1946) is a 74 y.o. female who returns to the [facility] on 05/04/2020 in re-evaluation of the following:  HPI: Markeesha returns to this clinic in reevaluation of allergic rhinitis and LPR and food allergy and a history of latex allergy.  Her last visit to this clinic was 30 November 2018.  Overall she has really done well with her airway issue while consistently using a nasal steroid and a occasional nasal antihistamine.  It does not sound as though she has required a systemic steroid or an antibiotic for any type of airway issue.  She still has some occasional runny nose.  She still occasionally has some postnasal drip and throat clearing.  Occasionally she will also develop an issue with a discomfort in her abdomen and regurgitation.  She does not consume any caffeine and she has been very good about eliminating throat clearing which is certainly helped her throat significantly.  She is using pantoprazole prescribed by her primary care doctor, Dr. Laurann Montana, about 3 times per week.  She remains away from eating tree nuts and pine nuts and banana and avocado.  She has not received the Covid vaccine yet.  She tells me that she was hospitalized in March 2021 for chest pain and jaw pain which led to the diagnosis of atrial flutter and she is now using a beta-blocker and Eliquis.  She also informs me that she received the shingles vaccine and developed myalgias and fever and a large local reaction for a few days.  Allergies as of 05/04/2020      Reactions   Codeine Hives, Other (See Comments)   Because of a history of documented adverse serious drug reaction, Medi Alert bracelet  is recommended   Macrodantin [nitrofurantoin Macrocrystal] Other (See Comments)     Numbness, aching all over   Other Shortness Of Breath, Rash, Other (See Comments)   Pine nuts & walnuts throat congestion. No documented angioedema. Avocado- Rash   Propoxyphene Hives, Swelling, Rash   Propoxyphene Hcl Hives, Swelling, Rash   Latex Rash   Banana Rash   Ciprofloxacin Nausea Only, Other (See Comments)   Nausea (Note: Patient takes this when on mission trips, however)   Tizanidine Other (See Comments)   Reaction not recalled   Tramadol Nausea Only      Medication List      acetaminophen 325 MG tablet Commonly known as: TYLENOL Take 325-650 mg by mouth every 6 (six) hours as needed (for pain).   apixaban 5 MG Tabs tablet Commonly known as: ELIQUIS Take 1 tablet (5 mg total) by mouth 2 (two) times daily.   cyclobenzaprine 10 MG tablet Commonly known as: FLEXERIL Take 2.5 mg by mouth daily as needed for muscle spasms.   fluorometholone 0.1 % ophthalmic suspension Commonly known as: FML Place 1 drop into both eyes every Monday, Wednesday, and Friday.   fluticasone 50 MCG/ACT nasal spray Commonly known as: FLONASE Place 1 spray into both nostrils 2 (two) times daily.   freestyle lancets Check blood sugar once daily as directed DX:790.29 (FREESTYLE FREEDOM LITE)   glucose blood test strip Commonly known as: FREESTYLE LITE CHECK BLOOD SUGAR ONCE DAILY AS DIRECTED.  DX:790.29   hydrocortisone 1 % ointment Apply 1 application topically 2 (two) times daily.  ipratropium 0.06 % nasal spray Commonly known as: ATROVENT Place 1-2 sprays into both nostrils every 6 (six) hours as needed for rhinitis.   Januvia 100 MG tablet Generic drug: sitaGLIPtin Take 100 mg by mouth daily.   loratadine 10 MG tablet Commonly known as: CLARITIN Take 1 tablet (10 mg total) by mouth daily.   metoprolol tartrate 50 MG tablet Commonly known as: LOPRESSOR Take 1.5 tablets (75 mg total) by mouth 2 (two) times daily.   montelukast 10 MG tablet Commonly known as:  SINGULAIR Take 1 tablet (10 mg total) by mouth at bedtime.   multivitamin tablet Take 1 tablet by mouth daily. Shaklee brand   pantoprazole 40 MG tablet Commonly known as: PROTONIX Take 40 mg by mouth 2 (two) times daily.   rosuvastatin 5 MG tablet Commonly known as: CRESTOR Take 5 mg by mouth See admin instructions. Take 5 mg by mouth at bedtime on Mondays and Thursdays..Currently on hold   verapamil 180 MG CR tablet Commonly known as: CALAN-SR Take 180 mg by mouth at bedtime.       Past Medical History:  Diagnosis Date  . Arthritis   . Borderline abnormal TFTs    as a teen  . Diabetes mellitus without complication ([place])   . E. coli UTI 10/05/12   [facility]; resistant only to Septra DS & tetracycline  . Eye infection   . Fibromyalgia   . GERD (gastroesophageal reflux disease)   . Headache(784.0)   . 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 HSV-1 infection   . HTN (hypertension)   . Hyperglycemia   . Hyperlipidemia   . Meningioma ([place])   . Nephrolithiasis    Dr Amalia Hailey, [facility], Hx of [3][  . Polyp of colon   . Radiculopathy     Past Surgical History:  Procedure Laterality Date  . ABDOMINAL HYSTERECTOMY     TAH/BSO --fibroids, no cancer  . BALLOON DILATION N/A 07/20/2014   Procedure: BALLOON DILATION;  Surgeon: Garlan Fair, MD;  Location: [facility];  Service: Endoscopy;  Laterality: N/A;  . BREAST BIOPSY     Right  . CARPAL TUNNEL RELEASE     & trigger thumb RUE; Dr Alphonzo Cruise  . COLONOSCOPY W/ POLYPECTOMY  07/2004   Neg 2011; Dr Earle Gell  . CYSTOSCOPY  11/2009   Neg  . ESOPHAGOGASTRODUODENOSCOPY N/A 07/20/2014   Procedure: ESOPHAGOGASTRODUODENOSCOPY (EGD);  Surgeon: Garlan Fair, MD;  Location: [facility];  Service: Endoscopy;  Laterality: N/A;  . FLEXIBLE BRONCHOSCOPY W/ UPPER ENDOSCOPY     neg  . KNEE ARTHROSCOPY     Left  . ROTATOR CUFF REPAIR     R shoulder  . TONSILLECTOMY    . TRIGGER FINGER RELEASE  Right    Right Thumb    Review of systems negative except as noted in HPI / PMHx or noted below:  Review of Systems  Constitutional: Negative.   HENT: Negative.   Eyes: Negative.   Respiratory: Negative.   Cardiovascular: Negative.   Gastrointestinal: Negative.   Genitourinary: Negative.   Musculoskeletal: Negative.   Skin: Negative.   Neurological: Negative.   Endo/Heme/Allergies: Negative.   Psychiatric/Behavioral: Negative.      Objective:   Vitals:   05/04/20 0934  BP: 118/74  Pulse: 61  Resp: 16  SpO2: 98%   Height: 5\' 5"  (165.1 cm)  Weight: 166 lb (75.3 kg)   Physical Exam Constitutional:      Appearance: She is  not diaphoretic.  HENT:     Head: Normocephalic.     Right Ear: Tympanic membrane, ear canal and external ear normal.     Left Ear: Tympanic membrane, ear canal and external ear normal.     Nose: Nose normal. No mucosal edema or rhinorrhea.     Mouth/Throat:     Pharynx: Uvula midline. No oropharyngeal exudate.  Eyes:     Conjunctiva/sclera: Conjunctivae normal.  Neck:     Thyroid: No thyromegaly.     Trachea: Trachea normal. No tracheal tenderness or tracheal deviation.  Cardiovascular:     Rate and Rhythm: Normal rate and regular rhythm.     Heart sounds: Normal heart sounds, S1 normal and S2 normal. No murmur heard.   Pulmonary:     Effort: No respiratory distress.     Breath sounds: Normal breath sounds. No stridor. No wheezing or rales.  Lymphadenopathy:     Head:     Right side of head: No tonsillar adenopathy.     Left side of head: No tonsillar adenopathy.     Cervical: No cervical adenopathy.  Skin:    Findings: No erythema or rash.     Nails: There is no clubbing.  Neurological:     Mental Status: She is alert.     Diagnostics: none  Assessment and Plan:   1. Other allergic rhinitis   2. LPRD (laryngopharyngeal reflux disease)   3. 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 Continue  avoidance - tree nuts, pine nuts, banana, avocado  2. Continue to Treat and prevent inflammation:   A. Flonase 1-2 sprays each nostril one time per day  B. montelukast 10 mg tablet once a day  3. Continue to Treat LPR:   A. continue to consolidate all caffeine and chocolate use  B. can add OTC Tums if needed  C. Continue to replace throat clearing with swallowing maneuver  D. Continue Pantoprazole prescribed by Dr. Laurann Montana  4. If needed:   A. nasal saline  B. OTC antihistamine  C. Epi-Pen  D. Can add OTC Mucinex two times per day  E.  Ipratropium 0.06% 1-2 sprays each nostril every 6 hours to dry nose  5. Obtain COVID vaccine and fall flu vaccine  6. Return to clinic in 1 year or earlier if problem  Clovia appears to be doing pretty well with her current therapy as noted above which includes some anti-inflammatory agents for airway and some therapy directed against reflux.  I think her reflux is probably a little more significant then she can recognize especially considering the fact that she still has some throat clearing and some occasional abdominal discomfort and I recommended that she use her pantoprazole on a consistent basis every day.  As well, she appears to have a significant knee problem which appears to be interfering with her lifestyle and she apparently has been offered the option of having a knee replacement.  I did inform her that the window for knee replacement may be closing the older she gets.  Of course, she would need to be off her Eliquis to have this procedure performed.  Assuming she does well with this plan I will see her back in this clinic in 1 year or earlier if there is a problem.  Allena Katz, MD Allergy / Immunology [place]

## 2020-05-05 ENCOUNTER — Encounter: Payer: Self-pay | Admitting: Allergy and Immunology

## 2020-05-10 ENCOUNTER — Telehealth: Payer: Self-pay | Admitting: *Deleted

## 2020-05-10 DIAGNOSIS — R0683 Snoring: Secondary | ICD-10-CM

## 2020-05-10 DIAGNOSIS — G4719 Other hypersomnia: Secondary | ICD-10-CM

## 2020-05-10 DIAGNOSIS — I10 Essential (primary) hypertension: Secondary | ICD-10-CM

## 2020-05-10 NOTE — Telephone Encounter (Signed)
Per Richardson Dopp :I will order a home sleep study to rule out sleep apnea.

## 2020-05-11 ENCOUNTER — Telehealth: Payer: Self-pay | Admitting: *Deleted

## 2020-05-11 DIAGNOSIS — M25562 Pain in left knee: Secondary | ICD-10-CM | POA: Insufficient documentation

## 2020-05-11 NOTE — Telephone Encounter (Signed)
Staff message sent to [facility] does not require a PA for HST. Ok to schedule.

## 2020-05-12 ENCOUNTER — Encounter: Payer: Self-pay | Admitting: Cardiovascular Disease

## 2020-05-12 ENCOUNTER — Other Ambulatory Visit: Payer: Self-pay

## 2020-05-12 ENCOUNTER — Ambulatory Visit (INDEPENDENT_AMBULATORY_CARE_PROVIDER_SITE_OTHER): Payer: Medicare Other | Admitting: Cardiovascular Disease

## 2020-05-12 VITALS — BP 114/58 | HR 80 | Ht 65.0 in | Wt 164.2 lb

## 2020-05-12 DIAGNOSIS — I10 Essential (primary) hypertension: Secondary | ICD-10-CM | POA: Diagnosis not present

## 2020-05-12 DIAGNOSIS — I4892 Unspecified atrial flutter: Secondary | ICD-10-CM | POA: Diagnosis not present

## 2020-05-12 MED ORDER — METOPROLOL TARTRATE 50 MG PO TABS: 75.0000 mg | ORAL_TABLET | Freq: Two times a day (BID) | ORAL | 3 refills | Status: DC

## 2020-05-12 NOTE — Patient Instructions (Signed)
Medication Instructions:  No changes *If you need a refill on your cardiac medications before your next appointment, please call your pharmacy*   Lab Work: none If you have labs (blood work) drawn today and your tests are completely normal, you will receive your results only by: Marland Kitchen MyChart Message (if you have MyChart) OR . A paper copy in the mail If you have any lab test that is abnormal or we need to change your treatment, we will call you to review the results.   Testing/Procedures: none   Follow-Up: At [facility], you and your health needs are our priority.  As part of our continuing mission to provide you with exceptional heart care, we have created designated Provider Care Teams.  These Care Teams include your primary Cardiologist (physician) and Advanced Practice Providers (APPs -  Physician Assistants and Nurse Practitioners) who all work together to provide you with the care you need, when you need it.  Your next appointment:   6 month(s)  The format for your next appointment:   In Person  Provider:   You may see   one of the following Advanced Practice Providers on your designated Care Team:    Richardson Dopp, PA-C  Robbie Lis, [place]    Other Instructions

## 2020-05-12 NOTE — Progress Notes (Signed)
Cardiology Office Note:    Date:  05/12/2020   ID:  Kristen Murray, DOB 1945/12/04, MRN [id number]  PCP:  [name], MD  [facility] Cardiologist:  Mertie Moores, MD  [place] Electrophysiologist:  None   Referring MD: [name], MD   Chief Complaint  Patient presents with  . Hypertension    History of Present Illness:    Kristen Murray is a 74 y.o. female with a hx of diabetes mellitus, reflux, hyperlipidemia, hypertension and recent diagnosis of atrial flutter.  I met her during the hospitalization in March, 2021 when she presented with atrial flutter. Echocardiogram at that time reveals normal left ventricular systolic function.  There was no significant valvular abnormalities.  Lexiscan Myoview study performed on January 30, 2020 reveals normal left ventricular systolic function with no evidence of ischemia.  Troponin levels were normal   Has had some palpitations Has rare episodes of jaw pan .   Is exercising in her house .   Has not gone outside much    Past Medical History:  Diagnosis Date  . Arthritis   . Borderline abnormal TFTs    as a teen  . Diabetes mellitus without complication ([place])   . E. coli UTI 10/05/12   [facility]; resistant only to Septra DS & tetracycline  . Eye infection   . Fibromyalgia   . GERD (gastroesophageal reflux disease)   . Headache(784.0)   . 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 HSV-1 infection   . HTN (hypertension)   . Hyperglycemia   . Hyperlipidemia   . Meningioma ([place])   . Nephrolithiasis    Dr Amalia Hailey, [facility], Hx of [3][  . Polyp of colon   . Radiculopathy     Past Surgical History:  Procedure Laterality Date  . ABDOMINAL HYSTERECTOMY     TAH/BSO --fibroids, no cancer  . BALLOON DILATION N/A 07/20/2014   Procedure: BALLOON DILATION;  Surgeon: Garlan Fair, MD;  Location: [facility];  Service: Endoscopy;  Laterality: N/A;  . BREAST BIOPSY     Right  . CARPAL TUNNEL  RELEASE     & trigger thumb RUE; Dr Alphonzo Cruise  . COLONOSCOPY W/ POLYPECTOMY  07/2004   Neg 2011; Dr Earle Gell  . CYSTOSCOPY  11/2009   Neg  . ESOPHAGOGASTRODUODENOSCOPY N/A 07/20/2014   Procedure: ESOPHAGOGASTRODUODENOSCOPY (EGD);  Surgeon: Garlan Fair, MD;  Location: [facility];  Service: Endoscopy;  Laterality: N/A;  . FLEXIBLE BRONCHOSCOPY W/ UPPER ENDOSCOPY     neg  . KNEE ARTHROSCOPY     Left  . ROTATOR CUFF REPAIR     R shoulder  . TONSILLECTOMY    . TRIGGER FINGER RELEASE Right    Right Thumb    Current Medications: Current Meds  Medication Sig  . acetaminophen (TYLENOL) 325 MG tablet Take 325-650 mg by mouth every 6 (six) hours as needed (for pain).  Marland Kitchen apixaban (ELIQUIS) 5 MG TABS tablet Take 1 tablet (5 mg total) by mouth 2 (two) times daily.  . cyclobenzaprine (FLEXERIL) 10 MG tablet Take 2.5 mg by mouth daily as needed for muscle spasms.   . fluorometholone (FML) 0.1 % ophthalmic suspension Place 1 drop into both eyes every Monday, Wednesday, and Friday.   . fluticasone (FLONASE) 50 MCG/ACT nasal spray Place 1 spray into both nostrils 2 (two) times daily.  Marland Kitchen glucose blood (FREESTYLE LITE) test strip CHECK BLOOD SUGAR ONCE DAILY AS DIRECTED.  DX:790.29  .  hydrocortisone 1 % ointment Apply 1 application topically 2 (two) times daily.  Marland Kitchen JANUVIA 100 MG tablet Take 100 mg by mouth daily.  . Lancets (FREESTYLE) lancets Check blood sugar once daily as directed DX:790.29 (FREESTYLE FREEDOM LITE)  . loratadine (CLARITIN) 10 MG tablet Take 1 tablet (10 mg total) by mouth daily.  . montelukast (SINGULAIR) 10 MG tablet Take 1 tablet (10 mg total) by mouth at bedtime.  . Multiple Vitamin (MULTIVITAMIN) tablet Take 1 tablet by mouth daily. Shaklee brand  . pantoprazole (PROTONIX) 40 MG tablet Take 40 mg by mouth 2 (two) times daily.  . rosuvastatin (CRESTOR) 5 MG tablet Take 5 mg by mouth See admin instructions. Take 5 mg by mouth at bedtime on Mondays and  Thursdays..Currently on hold  . TRULICITY 0.08 QP/6.1PJ SOPN Inject 0.75 mg into the skin once a week.  . verapamil (CALAN-SR) 180 MG CR tablet Take 180 mg by mouth at bedtime.   . [DISCONTINUED] metoprolol tartrate (LOPRESSOR) 50 MG tablet Take 1.5 tablets (75 mg total) by mouth 2 (two) times daily.     Allergies:   Codeine, Macrodantin [nitrofurantoin macrocrystal], Other, Propoxyphene, Propoxyphene hcl, Latex, Banana, Ciprofloxacin, Tizanidine, and Tramadol   Social History   Socioeconomic History  . Marital status: Married    Spouse name: Not on file  . Number of children: Not on file  . Years of education: Not on file  . Highest education level: Not on file  Occupational History  . Occupation: Estate manager/land agent: [employer]  Tobacco Use  . Smoking status: Never Smoker  . Smokeless tobacco: Never Used  Vaping Use  . Vaping Use: Never used  Substance and Sexual Activity  . Alcohol use: No    Alcohol/week: 0.0 standard drinks  . Drug use: No  . Sexual activity: Not Currently    Birth control/protection: Post-menopausal    Comment: 1st intercourse- 20  partners - 1  Other Topics Concern  . Not on file  Social History Narrative   Regular Exercise-no         Social Determinants of Health   Financial Resource Strain:   . Difficulty of Paying Living Expenses:   Food Insecurity:   . Worried About Charity fundraiser in the Last Year:   . Arboriculturist in the Last Year:   Transportation Needs:   . Film/video editor (Medical):   Marland Kitchen Lack of Transportation (Non-Medical):   Physical Activity:   . Days of Exercise per Week:   . Minutes of Exercise per Session:   Stress:   . Feeling of Stress :   Social Connections:   . Frequency of Communication with Friends and Family:   . Frequency of Social Gatherings with Friends and Family:   . Attends Religious Services:   . Active Member of Clubs or Organizations:   . Attends Archivist  Meetings:   Marland Kitchen Marital Status:      Family History: The patient's family history includes Alcohol abuse in her mother; Diabetes in her maternal aunt, maternal grandmother, and maternal uncle; Other in her father; Tuberculosis in her maternal uncle. There is no history of Coronary artery disease.  ROS:   Please see the history of present illness.     All other systems reviewed and are negative.  EKGs/Labs/Other Studies Reviewed:    The following studies were reviewed today:   EKG:     Recent Labs: 01/27/2020: TSH 3.229 01/30/2020: ALT 14;  Magnesium 2.0 03/10/2020: BUN 10; Creatinine, Ser 0.74; Hemoglobin 12.0; Platelets 221; Potassium 4.5; Sodium 138  Recent Lipid Panel    Component Value Date/Time   CHOL 164 01/27/2020 1835   TRIG 85 01/27/2020 1835   HDL 44 01/27/2020 1835   CHOLHDL 3.7 01/27/2020 1835   VLDL 17 01/27/2020 1835   LDLCALC 103 (H) 01/27/2020 1835    Physical Exam:    VS:  BP (!) 114/58   Pulse 80   Ht 5' 5" (1.651 m)   Wt 164 lb 3.2 oz (74.5 kg)   LMP 07/30/1990   SpO2 97%   BMI 27.32 kg/m     Wt Readings from Last 3 Encounters:  05/12/20 164 lb 3.2 oz (74.5 kg)  05/04/20 166 lb (75.3 kg)  02/11/20 169 lb 9.6 oz (76.9 kg)     GEN:  Well nourished, well developed in no acute distress HEENT: Normal NECK: No JVD; No carotid bruits LYMPHATICS: No lymphadenopathy CARDIAC: RRR, no murmurs, rubs, gallops RESPIRATORY:  Clear to auscultation without rales, wheezing or rhonchi  ABDOMEN: Soft, non-tender, non-distended MUSCULOSKELETAL:  No edema; No deformity  SKIN: Warm and dry NEUROLOGIC:  Alert and oriented x 3 PSYCHIATRIC:  Normal affect   ASSESSMENT:    No diagnosis found. PLAN:    In order of problems listed above:  1. Atrial Flutter:    Has occasional palpitations.  Overall, has done well.  Cont eliquis  Overall is doing very well   2.  Hypertension: Blood pressure stable.  3.  Hyperlipidemia: Managed by her primary medical  doctor.  Medication Adjustments/Labs and Tests Ordered: Current medicines are reviewed at length with the patient today.  Concerns regarding medicines are outlined above.  No orders of the defined types were placed in this encounter.  No orders of the defined types were placed in this encounter.   Patient Instructions  Medication Instructions:  No changes *If you need a refill on your cardiac medications before your next appointment, please call your pharmacy*   Lab Work: none If you have labs (blood work) drawn today and your tests are completely normal, you will receive your results only by: Marland Kitchen MyChart Message (if you have MyChart) OR . A paper copy in the mail If you have any lab test that is abnormal or we need to change your treatment, we will call you to review the results.   Testing/Procedures: none   Follow-Up: At [facility], you and your health needs are our priority.  As part of our continuing mission to provide you with exceptional heart care, we have created designated Provider Care Teams.  These Care Teams include your primary Cardiologist (physician) and Advanced Practice Providers (APPs -  Physician Assistants and Nurse Practitioners) who all work together to provide you with the care you need, when you need it.  Your next appointment:   6 month(s)  The format for your next appointment:   In Person  Provider:   You may see   one of the following Advanced Practice Providers on your designated Care Team:    Richardson Dopp, PA-C  Robbie Lis, [place]    Other Instructions      Signed, Mertie Moores, MD  05/12/2020 6:00 PM    [place]

## 2020-05-12 NOTE — Telephone Encounter (Signed)
Patient is aware and agreeable to Home Sleep Study through [facility] [facility]. Patient is scheduled for 07/23/2020 at 10 to pick up home sleep kit and meet with Respiratory therapist at [facility]. Patient is aware that if this appointment date and time does not work for them they should contact Artis Delay directly at [phone]. Patient is aware that a sleep packet will be sent from [facility] in week. Left detailed message on voicemail with date and time of ss and informed patient to call back to confirm or reschedule.

## 2020-05-13 ENCOUNTER — Other Ambulatory Visit: Payer: Medicare Other

## 2020-05-13 ENCOUNTER — Ambulatory Visit: Payer: Medicare Other | Admitting: Hematology & Oncology

## 2020-05-14 ENCOUNTER — Other Ambulatory Visit: Payer: Self-pay | Admitting: *Deleted

## 2020-05-14 DIAGNOSIS — D508 Other iron deficiency anemias: Secondary | ICD-10-CM

## 2020-05-14 DIAGNOSIS — D72819 Decreased white blood cell count, unspecified: Secondary | ICD-10-CM

## 2020-05-15 ENCOUNTER — Other Ambulatory Visit: Payer: Self-pay | Admitting: Physician Assistant

## 2020-05-17 ENCOUNTER — Other Ambulatory Visit: Payer: Self-pay

## 2020-05-17 ENCOUNTER — Inpatient Hospital Stay: Payer: Medicare Other | Attending: Hematology & Oncology

## 2020-05-17 ENCOUNTER — Inpatient Hospital Stay (HOSPITAL_BASED_OUTPATIENT_CLINIC_OR_DEPARTMENT_OTHER): Payer: Medicare Other | Admitting: Hematology & Oncology

## 2020-05-17 ENCOUNTER — Encounter: Payer: Self-pay | Admitting: Hematology & Oncology

## 2020-05-17 VITALS — BP 133/63 | HR 92 | Temp 98.6°F | Resp 16 | Wt 164.0 lb

## 2020-05-17 DIAGNOSIS — D508 Other iron deficiency anemias: Secondary | ICD-10-CM

## 2020-05-17 DIAGNOSIS — D72819 Decreased white blood cell count, unspecified: Secondary | ICD-10-CM | POA: Diagnosis not present

## 2020-05-17 DIAGNOSIS — K909 Intestinal malabsorption, unspecified: Secondary | ICD-10-CM | POA: Diagnosis not present

## 2020-05-17 DIAGNOSIS — Z7901 Long term (current) use of anticoagulants: Secondary | ICD-10-CM | POA: Diagnosis not present

## 2020-05-17 DIAGNOSIS — D500 Iron deficiency anemia secondary to blood loss (chronic): Secondary | ICD-10-CM | POA: Diagnosis not present

## 2020-05-17 HISTORY — DX: Decreased white blood cell count, unspecified: D72.819

## 2020-05-17 LAB — RETICULOCYTES
Immature Retic Fract: 9 % (ref 2.3–15.9)
RBC.: 4.42 MIL/uL (ref 3.87–5.11)
Retic Count, Absolute: 56.6 10*3/uL (ref 19.0–186.0)
Retic Ct Pct: 1.3 % (ref 0.4–3.1)

## 2020-05-17 LAB — CBC WITH DIFFERENTIAL (CANCER CENTER ONLY)
Abs Immature Granulocytes: 0.01 10*3/uL (ref 0.00–0.07)
Basophils Absolute: 0 10*3/uL (ref 0.0–0.1)
Basophils Relative: 0 %
Eosinophils Absolute: 0 10*3/uL (ref 0.0–0.5)
Eosinophils Relative: 0 %
HCT: 39.2 % (ref 36.0–46.0)
Hemoglobin: 12.2 g/dL (ref 12.0–15.0)
Immature Granulocytes: 0 %
Lymphocytes Relative: 60 %
Lymphs Abs: 1.9 10*3/uL (ref 0.7–4.0)
MCH: 27.3 pg (ref 26.0–34.0)
MCHC: 31.1 g/dL (ref 30.0–36.0)
MCV: 87.7 fL (ref 80.0–100.0)
Monocytes Absolute: 0.5 10*3/uL (ref 0.1–1.0)
Monocytes Relative: 14 %
Neutro Abs: 0.8 10*3/uL — ABNORMAL LOW (ref 1.7–7.7)
Neutrophils Relative %: 26 %
Platelet Count: 177 10*3/uL (ref 150–400)
RBC: 4.47 MIL/uL (ref 3.87–5.11)
RDW: 16.6 % — ABNORMAL HIGH (ref 11.5–15.5)
WBC Count: 3.3 10*3/uL — ABNORMAL LOW (ref 4.0–10.5)
nRBC: 0 % (ref 0.0–0.2)

## 2020-05-17 LAB — CMP (CANCER CENTER ONLY)
ALT: 9 U/L (ref 0–44)
AST: 10 U/L — ABNORMAL LOW (ref 15–41)
Albumin: 4.2 g/dL (ref 3.5–5.0)
Alkaline Phosphatase: 81 U/L (ref 38–126)
Anion gap: 5 (ref 5–15)
BUN: 12 mg/dL (ref 8–23)
CO2: 32 mmol/L (ref 22–32)
Calcium: 10.2 mg/dL (ref 8.9–10.3)
Chloride: 101 mmol/L (ref 98–111)
Creatinine: 0.7 mg/dL (ref 0.44–1.00)
GFR, Est AFR Am: >60 mL/min (ref >60)
GFR, Estimated: >60 mL/min (ref >60)
Glucose, Bld: 117 mg/dL — ABNORMAL HIGH (ref 70–99)
Potassium: 4.1 mmol/L (ref 3.5–5.1)
Sodium: 138 mmol/L (ref 135–145)
Total Bilirubin: 0.4 mg/dL (ref 0.3–1.2)
Total Protein: 7.6 g/dL (ref 6.5–8.1)

## 2020-05-17 LAB — SAVE SMEAR(SSMR), FOR PROVIDER SLIDE REVIEW

## 2020-05-17 NOTE — Progress Notes (Signed)
Hematology and Oncology Follow Up Visit  Kristen Murray [id number] 1946-03-28 74 y.o. 05/17/2020   Principle Diagnosis:   Leukopenia-progressive  Iron deficiency anemia-malabsorption  Current Therapy:    IV iron as needed-dose of Feraheme given on 11/28/2019      Interim History:  Kristen Murray is back for follow-up.  Unfortunately, since last time we saw her, she had to be in the hospital.  It sounds like she developed atrial flutter.  She in the hospital back in late March.  She was placed on verapamil.  She was also placed on Eliquis.  She sees cardiology probably every 6 months now.  She is doing well with respect to any problems with the low white cells.  She has had no problem with infections.  She has had no problem with rashes.  There is been no nausea or vomiting.  She has had no change in bowel or bladder habits.  She has had no cough or shortness of breath.  She has not yet had the coronavirus vaccine.  She says that she will get it but is little bit worried because she had a reaction to the shingles vaccine.  She did get iron back in January 2021.  At that time, her iron saturation was 13%.  Overall, her performance status is ECOG 1.  Medications:  Current Outpatient Medications:    acetaminophen (TYLENOL) 325 MG tablet, Take 325-650 mg by mouth every 6 (six) hours as needed (for pain)., Disp: , Rfl:    apixaban (ELIQUIS) 5 MG TABS tablet, Take 1 tablet (5 mg total) by mouth 2 (two) times daily., Disp: 60 tablet, Rfl: 3   cyclobenzaprine (FLEXERIL) 10 MG tablet, Take 2.5 mg by mouth daily as needed for muscle spasms. , Disp: , Rfl:    diltiazem (CARDIZEM LA) 120 MG 24 hr tablet, Take 120 mg by mouth daily., Disp: , Rfl:    fluorometholone (FML) 0.1 % ophthalmic suspension, Place 1 drop into both eyes every Monday, Wednesday, and Friday. , Disp: , Rfl:    fluticasone (FLONASE) 50 MCG/ACT nasal spray, Place 1 spray into both nostrils 2 (two) times daily., Disp: 16 g,  Rfl: 5   glucose blood (FREESTYLE LITE) test strip, CHECK BLOOD SUGAR ONCE DAILY AS DIRECTED.  DX:790.29, Disp: 100 each, Rfl: 12   hydrocortisone 1 % ointment, Apply 1 application topically 2 (two) times daily., Disp: 30 g, Rfl: 0   JANUVIA 100 MG tablet, Take 100 mg by mouth daily., Disp: , Rfl:    Lancets (FREESTYLE) lancets, Check blood sugar once daily as directed DX:790.29 (FREESTYLE FREEDOM LITE), Disp: 100 each, Rfl: 1   loratadine (CLARITIN) 10 MG tablet, Take 1 tablet (10 mg total) by mouth daily., Disp: 30 tablet, Rfl: 0   metoprolol tartrate (LOPRESSOR) 50 MG tablet, TAKE 1 AND 1/2 TABLETS(75 MG) BY MOUTH TWICE DAILY, Disp: 270 tablet, Rfl: 3   montelukast (SINGULAIR) 10 MG tablet, Take 1 tablet (10 mg total) by mouth at bedtime., Disp: 30 tablet, Rfl: 5   Multiple Vitamin (MULTIVITAMIN) tablet, Take 1 tablet by mouth daily. Shaklee brand, Disp: , Rfl:    pantoprazole (PROTONIX) 40 MG tablet, Take 40 mg by mouth 2 (two) times daily., Disp: , Rfl:    rosuvastatin (CRESTOR) 5 MG tablet, Take 5 mg by mouth See admin instructions. Take 5 mg by mouth at bedtime on Mondays and Thursdays..Currently on hold, Disp: , Rfl:    TRULICITY 4.33 IR/5.1OA SOPN, Inject 0.75 mg into the skin once  a week., Disp: , Rfl:    verapamil (CALAN-SR) 180 MG CR tablet, Take 180 mg by mouth at bedtime. , Disp: , Rfl:   Allergies:  Allergies  Allergen Reactions   Codeine Hives and Other (See Comments)    Because of a history of documented adverse serious drug reaction, Medi Alert bracelet  is recommended   Macrodantin [Nitrofurantoin Macrocrystal] Other (See Comments)    Numbness, aching all over   Other Shortness Of Breath, Rash and Other (See Comments)    Pine nuts & walnuts throat congestion. No documented angioedema. Avocado- Rash   Propoxyphene Hives, Swelling and Rash   Propoxyphene Hcl Hives, Swelling and Rash   Latex Rash   Banana Rash   Ciprofloxacin Nausea Only and Other (See  Comments)    Nausea (Note: Patient takes this when on mission trips, however)   Tizanidine Other (See Comments)    Reaction not recalled   Tramadol Nausea Only    Past Medical History, Surgical history, Social history, and Family History were reviewed and updated.  Review of Systems: Review of Systems  Constitutional: Negative.   HENT:  Negative.   Eyes: Negative.   Respiratory: Negative.   Cardiovascular: Negative.   Gastrointestinal: Negative.   Endocrine: Negative.   Genitourinary: Negative.    Musculoskeletal: Negative.   Skin: Negative.   Neurological: Negative.   Hematological: Negative.   Psychiatric/Behavioral: Negative.     Physical Exam:  weight is 164 lb (74.4 kg). Her oral temperature is 98.6 F (37 C). Her blood pressure is 133/63 and her pulse is 92. Her respiration is 16 and oxygen saturation is 100%.   Wt Readings from Last 3 Encounters:  05/17/20 164 lb (74.4 kg)  05/12/20 164 lb 3.2 oz (74.5 kg)  05/04/20 166 lb (75.3 kg)    Physical Exam Vitals reviewed.  HENT:     Head: Normocephalic and atraumatic.  Eyes:     Pupils: Pupils are equal, round, and reactive to light.  Cardiovascular:     Rate and Rhythm: Normal rate and regular rhythm.     Heart sounds: Normal heart sounds.  Pulmonary:     Effort: Pulmonary effort is normal.     Breath sounds: Normal breath sounds.  Abdominal:     General: Bowel sounds are normal.     Palpations: Abdomen is soft.  Musculoskeletal:        General: No tenderness or deformity. Normal range of motion.     Cervical back: Normal range of motion.     Comments: There is moderate swelling of the left calf area.  This is somewhat tender to palpation.  There is no redness.  I cannot palpate any obvious venous cord.  There is no pain behind the left knee.  She has decent pulses in her distal extremities.  Lymphadenopathy:     Cervical: No cervical adenopathy.  Skin:    General: Skin is warm and dry.     Findings: No  erythema or rash.  Neurological:     Mental Status: She is alert and oriented to person, place, and time.  Psychiatric:        Behavior: Behavior normal.        Thought Content: Thought content normal.        Judgment: Judgment normal.      Lab Results  Component Value Date   WBC 3.3 (L) 05/17/2020   HGB 12.2 05/17/2020   HCT 39.2 05/17/2020   MCV 87.7 05/17/2020   PLT  177 05/17/2020     Chemistry      Component Value Date/Time   NA 138 05/17/2020 1411   NA 138 03/10/2020 1138   K 4.1 05/17/2020 1411   CL 101 05/17/2020 1411   CO2 32 05/17/2020 1411   BUN 12 05/17/2020 1411   BUN 10 03/10/2020 1138   CREATININE 0.70 05/17/2020 1411      Component Value Date/Time   CALCIUM 10.2 05/17/2020 1411   ALKPHOS 81 05/17/2020 1411   AST 10 (L) 05/17/2020 1411   ALT 9 05/17/2020 1411   BILITOT 0.4 05/17/2020 1411       Impression and Plan: Kristen Murray is a 74 year old African-American female.  I am happy that her white cells are little bit better.  I looked at her blood smear under the microscope.  Her white cells looked mature.  She may have had a slight increase in lymphocytes.  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  I am glad that her anemia has resolved with the iron.  We will plan to get her back in another 4 or 5 months.  I would like to make sure we see her back little more often now that she has this cardiac issue.     [name], MD 7/19/20213:19 PM

## 2020-05-18 ENCOUNTER — Telehealth: Payer: Self-pay | Admitting: *Deleted

## 2020-05-18 LAB — IRON AND TIBC
Iron: 101 ug/dL (ref 41–142)
Saturation Ratios: 40 % (ref 21–57)
TIBC: 253 ug/dL (ref 236–444)
UIBC: 152 ug/dL (ref 120–384)

## 2020-05-18 LAB — FERRITIN: Ferritin: 483 ng/mL — ABNORMAL HIGH (ref 11–307)

## 2020-05-18 NOTE — Telephone Encounter (Signed)
As noted below by Dr. Marin Olp, I informed the patient that her iron level is great. She verbalized understanding.

## 2020-05-18 NOTE — Telephone Encounter (Signed)
-----   Message from [name], MD sent at 05/18/2020  1:07 PM EDT ----- Call - the iron level is great!!  pete

## 2020-05-21 ENCOUNTER — Other Ambulatory Visit: Payer: Self-pay

## 2020-05-21 MED ORDER — APIXABAN 5 MG PO TABS: 5.0000 mg | ORAL_TABLET | Freq: Two times a day (BID) | ORAL | 1 refills | Status: DC

## 2020-05-21 NOTE — Telephone Encounter (Signed)
Eliquis 5mg  refill request received. Patient is 74 years old, weight-74.4kg, Crea-0.70 on 05/17/2020, Diagnosis-Aflutter, and last seen by Dr. Acie Fredrickson on 05/12/2020. Dose is appropriate based on dosing criteria. Will send in refill to requested pharmacy.

## 2020-06-14 ENCOUNTER — Other Ambulatory Visit: Payer: Self-pay | Admitting: Cardiovascular Disease

## 2020-06-15 NOTE — Telephone Encounter (Signed)
Home sleep study scheduled for 9/24

## 2020-07-23 ENCOUNTER — Other Ambulatory Visit: Payer: Self-pay

## 2020-07-23 ENCOUNTER — Ambulatory Visit (HOSPITAL_BASED_OUTPATIENT_CLINIC_OR_DEPARTMENT_OTHER): Payer: Medicare Other | Attending: Physician Assistant | Admitting: Cardiology

## 2020-07-23 DIAGNOSIS — G4733 Obstructive sleep apnea (adult) (pediatric): Secondary | ICD-10-CM | POA: Insufficient documentation

## 2020-07-23 DIAGNOSIS — R0683 Snoring: Secondary | ICD-10-CM

## 2020-07-23 DIAGNOSIS — I10 Essential (primary) hypertension: Secondary | ICD-10-CM

## 2020-07-23 DIAGNOSIS — G4719 Other hypersomnia: Secondary | ICD-10-CM

## 2020-07-26 NOTE — Procedures (Signed)
   Patient Name: Kristen Murray, Kristen Murray Date: 07/23/2020 Gender: Female D.O.B: 17-Sep-1946 Age (years): 70 Referring Provider: Richardson Dopp Height (inches): 51 Interpreting Physician: Fransico Him MD, ABSM Weight (lbs): 167 RPSGT: Jacolyn Reedy BMI: 28 MRN: [id number] Neck Size: 13.50  CLINICAL INFORMATION Sleep Study Type: HST  Indication for sleep study: N/A  Epworth Sleepiness Score: 13  SLEEP STUDY TECHNIQUE A multi-channel overnight portable sleep study was performed. The channels recorded were: nasal airflow, thoracic respiratory movement, and oxygen saturation with a pulse oximetry. Snoring was also monitored.  MEDICATIONS Patient self administered medications include: N/A.  SLEEP ARCHITECTURE Patient was studied for 556.8 minutes. The sleep efficiency was 100.0 % and the patient was supine for 81.2%. The arousal index was 0.0 per hour.  RESPIRATORY PARAMETERS The overall AHI was 16.9 per hour, with a central apnea index of 0.0 per hour.  The oxygen nadir was 77% during sleep.  CARDIAC DATA Mean heart rate during sleep was 66.6 bpm.  IMPRESSIONS - Moderate obstructive sleep apnea occurred during this study (AHI = 16.9/h). - No significant central sleep apnea occurred during this study (CAI = 0.0/h). - Severe oxygen desaturation was noted during this study (Min O2 = 77%). - Patient snored 0.1% during the sleep.  DIAGNOSIS - Obstructive Sleep Apnea (G47.33)  RECOMMENDATIONS - Recommend ResMed CPAP on auto from 4 to 15cm H2O with heated humidity and mask of choice.  - Avoid alcohol, sedatives and other CNS depressants that may worsen sleep apnea and disrupt normal sleep architecture. - Sleep hygiene should be reviewed to assess factors that may improve sleep quality. - Weight management and regular exercise should be initiated or continued. - Return to [facility] Center in 8 weeks.  [Electronically signed] 07/26/2020 08:27 PM  Fransico Him MD, ABSM Diplomate,  American Board of Sleep Medicine

## 2020-07-27 ENCOUNTER — Telehealth: Payer: Self-pay | Admitting: *Deleted

## 2020-07-27 NOTE — Telephone Encounter (Signed)
Informed patient of sleep study results and patient understanding was verbalized. Patient understands her sleep study showed they have sleep apnea and recommend auto CPAP titration through DME. Orders have been placed in Epic. Please set 8 week OV with me.   Upon patient request DME selection is [facility]. Patient understands she will be contacted by [facility] to set up her cpap. Patient understands to call if [place] [facility] does not contact her with new setup in a timely manner. Patient understands they will be called once confirmation has been received from [facility] that they have received their new machine to schedule 10 week follow up appointment.  [facility] notified of new cpap order  Please add to airview Patient was grateful for the call and thanked me.

## 2020-07-27 NOTE — Telephone Encounter (Signed)
-----   Message from Sueanne Margarita, MD sent at 07/26/2020  8:31 PM EDT ----- Please let patient know that they have sleep apnea and recommend auto CPAP titration through DME.  Orders have been placed in Epic. Please set 8 week OV with me.

## 2020-07-27 NOTE — Progress Notes (Signed)
+   [facility] Needs CPAP. Gae Bon, please make sure CPAP is arranged. Thanks, Richardson Dopp, PA-C    07/27/2020 5:07 PM

## 2020-07-30 DIAGNOSIS — M48061 Spinal stenosis, lumbar region without neurogenic claudication: Secondary | ICD-10-CM | POA: Insufficient documentation

## 2020-08-20 ENCOUNTER — Telehealth: Payer: Self-pay | Admitting: *Deleted

## 2020-08-20 NOTE — Telephone Encounter (Signed)
Pharmacy, can you please comment on how long Eliquis can be held for upcoming surgery.   Thank you!

## 2020-08-20 NOTE — Telephone Encounter (Signed)
   [facility] Pre-operative Risk Assessment    HEARTCARE STAFF: - Please ensure there is not already an duplicate clearance open for this procedure. - Under Visit Info/Reason for Call, type in Other and utilize the format Clearance MM/DD/YY or Clearance TBD. Do not use dashes or single digits. - If request is for dental extraction, please clarify the # of teeth to be extracted.  Request for surgical clearance:  1. What type of surgery is being performed? RIGHT TOTAL KNEE ARTHROPLASTY   2. When is this surgery scheduled? TBD   3. What type of clearance is required (medical clearance vs. Pharmacy clearance to hold med vs. Both)? BOTH  4. Are there any medications that need to be held prior to surgery and how long? ELIQUIS   5. Practice name and name of physician performing surgery? [facility]; DR. Esterbrook   6. What is the office phone number? [phone]   7.   What is the office fax number? [email or web address]  8.   Anesthesia type (None, local, MAC, general) ? SPINAL   Julaine Hua 08/20/2020, 2:01 PM  _________________________________________________________________   (provider comments below)

## 2020-08-20 NOTE — Telephone Encounter (Signed)
Patient with diagnosis of atrial fibrillation on Eliquis for anticoagulation.    Procedure: right total knee arthroplasty Date of procedure: TBD    CHA2DS2-VASc Score = 5  This indicates a 7.2% annual risk of stroke. The patient's score is based upon: CHF History: 0 HTN History: 1 Diabetes History: 1 Stroke History: 0 Vascular Disease History: 0 Age Score: 2 Gender Score: 1   CrCl 81.6 Platelet count 177  Per office protocol, patient can hold Eliquis for 3 days prior to procedure.   Patient will not need bridging with Lovenox (enoxaparin) around procedure.  For orthopedic procedures please be sure to resume therapeutic (not prophylactic) dosing.

## 2020-08-23 NOTE — Telephone Encounter (Signed)
° °  Primary Cardiologist: Mertie Moores, MD  Chart reviewed as part of pre-operative protocol coverage. I called patient and she denies any changes in cardiac symptoms. Denies chest pain, shortness of breath, palpitations, lightheadedness, or dizziness. Given past medical history and time since last visit, based on ACC/AHA guidelines, Kristen Murray would be at acceptable risk for the planned procedure without further cardiovascular testing.   Per pharmacy recommendations it is okay to hold Eliquis 3 days prior to her procedure. Patient will not need bridging with Lovenox around procedure.   The patient was advised that if she develops new symptoms prior to surgery to contact our office to arrange for a follow-up visit, and she verbalized understanding.  I will route this recommendation to the requesting party via Epic fax function and remove from pre-op pool.  Please call with questions.   Ninfa Meeker, PA-C 08/23/2020, 11:45 AM

## 2020-09-03 ENCOUNTER — Telehealth: Payer: Self-pay

## 2020-09-03 NOTE — Telephone Encounter (Signed)
Sanitary pads could definitely be the reason for the irritation/rash. I recommend a brand for sensitive skin. I know "Always" has a sensitive one and there are organic pads that can be found at [employer] and probably most pharmacies. Make sure Kristen Murray is changing frequently to keep genital area as dry as possible. Sleep without underwear and air out as much as possible.

## 2020-09-03 NOTE — Telephone Encounter (Signed)
Patient saw you on 04/13/20 for a "rash". She said you prescribed cortisone cream but it still persists.  She questions if the Poise or Kotex pads that she wears might be irritating her. Asked if you could suggest something less abrasive of what you rec?

## 2020-09-03 NOTE — Telephone Encounter (Signed)
Spoke with patient and informed her. °

## 2020-09-07 ENCOUNTER — Telehealth: Payer: Self-pay | Admitting: *Deleted

## 2020-09-07 ENCOUNTER — Other Ambulatory Visit: Payer: Self-pay | Admitting: *Deleted

## 2020-09-07 ENCOUNTER — Other Ambulatory Visit: Payer: Self-pay | Admitting: Family

## 2020-09-07 ENCOUNTER — Encounter: Payer: Self-pay | Admitting: *Deleted

## 2020-09-07 DIAGNOSIS — D72819 Decreased white blood cell count, unspecified: Secondary | ICD-10-CM

## 2020-09-07 DIAGNOSIS — D708 Other neutropenia: Secondary | ICD-10-CM

## 2020-09-07 DIAGNOSIS — D500 Iron deficiency anemia secondary to blood loss (chronic): Secondary | ICD-10-CM

## 2020-09-07 NOTE — Telephone Encounter (Signed)
Patient called back this morning to verify her appointments tomorrow, 09/08/20.

## 2020-09-08 ENCOUNTER — Ambulatory Visit: Payer: Medicare Other | Admitting: Family

## 2020-09-08 ENCOUNTER — Inpatient Hospital Stay: Payer: Medicare Other | Attending: Hematology & Oncology

## 2020-09-08 ENCOUNTER — Other Ambulatory Visit: Payer: Self-pay

## 2020-09-08 DIAGNOSIS — D72819 Decreased white blood cell count, unspecified: Secondary | ICD-10-CM

## 2020-09-08 DIAGNOSIS — D500 Iron deficiency anemia secondary to blood loss (chronic): Secondary | ICD-10-CM

## 2020-09-08 DIAGNOSIS — Z79899 Other long term (current) drug therapy: Secondary | ICD-10-CM | POA: Diagnosis not present

## 2020-09-08 DIAGNOSIS — D708 Other neutropenia: Secondary | ICD-10-CM

## 2020-09-08 LAB — CBC WITH DIFFERENTIAL (CANCER CENTER ONLY)
Abs Immature Granulocytes: 0 10*3/uL (ref 0.00–0.07)
Basophils Absolute: 0 10*3/uL (ref 0.0–0.1)
Basophils Relative: 0 %
Eosinophils Absolute: 0 10*3/uL (ref 0.0–0.5)
Eosinophils Relative: 0 %
HCT: 35.9 % — ABNORMAL LOW (ref 36.0–46.0)
Hemoglobin: 11.5 g/dL — ABNORMAL LOW (ref 12.0–15.0)
Immature Granulocytes: 0 %
Lymphocytes Relative: 71 %
Lymphs Abs: 2.2 10*3/uL (ref 0.7–4.0)
MCH: 28.1 pg (ref 26.0–34.0)
MCHC: 32 g/dL (ref 30.0–36.0)
MCV: 87.8 fL (ref 80.0–100.0)
Monocytes Absolute: 0.4 10*3/uL (ref 0.1–1.0)
Monocytes Relative: 14 %
Neutro Abs: 0.5 10*3/uL — ABNORMAL LOW (ref 1.7–7.7)
Neutrophils Relative %: 15 %
Platelet Count: 166 10*3/uL (ref 150–400)
RBC: 4.09 MIL/uL (ref 3.87–5.11)
RDW: 15.1 % (ref 11.5–15.5)
WBC Count: 3.1 10*3/uL — ABNORMAL LOW (ref 4.0–10.5)
nRBC: 0 % (ref 0.0–0.2)

## 2020-09-08 LAB — CMP (CANCER CENTER ONLY)
ALT: 14 U/L (ref 0–44)
AST: 15 U/L (ref 15–41)
Albumin: 3.9 g/dL (ref 3.5–5.0)
Alkaline Phosphatase: 68 U/L (ref 38–126)
Anion gap: 5 (ref 5–15)
BUN: 15 mg/dL (ref 8–23)
CO2: 31 mmol/L (ref 22–32)
Calcium: 10 mg/dL (ref 8.9–10.3)
Chloride: 103 mmol/L (ref 98–111)
Creatinine: 0.62 mg/dL (ref 0.44–1.00)
GFR, Estimated: >60 mL/min (ref >60)
Glucose, Bld: 60 mg/dL — ABNORMAL LOW (ref 70–99)
Potassium: 4.5 mmol/L (ref 3.5–5.1)
Sodium: 139 mmol/L (ref 135–145)
Total Bilirubin: 0.3 mg/dL (ref 0.3–1.2)
Total Protein: 7.3 g/dL (ref 6.5–8.1)

## 2020-09-08 LAB — RETICULOCYTES
Immature Retic Fract: 7.5 % (ref 2.3–15.9)
RBC.: 4.1 MIL/uL (ref 3.87–5.11)
Retic Count, Absolute: 68.1 10*3/uL (ref 19.0–186.0)
Retic Ct Pct: 1.7 % (ref 0.4–3.1)

## 2020-09-09 LAB — IRON AND TIBC
Iron: 91 ug/dL (ref 41–142)
Saturation Ratios: 36 % (ref 21–57)
TIBC: 251 ug/dL (ref 236–444)
UIBC: 160 ug/dL (ref 120–384)

## 2020-09-09 LAB — FERRITIN: Ferritin: 376 ng/mL — ABNORMAL HIGH (ref 11–307)

## 2020-09-29 ENCOUNTER — Other Ambulatory Visit: Payer: Self-pay

## 2020-09-29 ENCOUNTER — Telehealth: Payer: Self-pay

## 2020-09-29 ENCOUNTER — Inpatient Hospital Stay: Payer: Medicare Other | Attending: Hematology & Oncology

## 2020-09-29 ENCOUNTER — Encounter: Payer: Self-pay | Admitting: Hematology & Oncology

## 2020-09-29 ENCOUNTER — Inpatient Hospital Stay (HOSPITAL_BASED_OUTPATIENT_CLINIC_OR_DEPARTMENT_OTHER): Payer: Medicare Other | Admitting: Hematology & Oncology

## 2020-09-29 VITALS — BP 138/58 | HR 79 | Temp 99.0°F | Resp 20 | Wt 169.0 lb

## 2020-09-29 DIAGNOSIS — D72819 Decreased white blood cell count, unspecified: Secondary | ICD-10-CM

## 2020-09-29 DIAGNOSIS — Z7901 Long term (current) use of anticoagulants: Secondary | ICD-10-CM | POA: Insufficient documentation

## 2020-09-29 DIAGNOSIS — D508 Other iron deficiency anemias: Secondary | ICD-10-CM | POA: Insufficient documentation

## 2020-09-29 DIAGNOSIS — Z7984 Long term (current) use of oral hypoglycemic drugs: Secondary | ICD-10-CM | POA: Diagnosis not present

## 2020-09-29 DIAGNOSIS — Z79899 Other long term (current) drug therapy: Secondary | ICD-10-CM | POA: Diagnosis not present

## 2020-09-29 DIAGNOSIS — K909 Intestinal malabsorption, unspecified: Secondary | ICD-10-CM | POA: Diagnosis not present

## 2020-09-29 DIAGNOSIS — E119 Type 2 diabetes mellitus without complications: Secondary | ICD-10-CM | POA: Insufficient documentation

## 2020-09-29 DIAGNOSIS — D500 Iron deficiency anemia secondary to blood loss (chronic): Secondary | ICD-10-CM

## 2020-09-29 LAB — CBC WITH DIFFERENTIAL (CANCER CENTER ONLY)
Abs Immature Granulocytes: 0.01 10*3/uL (ref 0.00–0.07)
Basophils Absolute: 0 10*3/uL (ref 0.0–0.1)
Basophils Relative: 0 %
Eosinophils Absolute: 0 10*3/uL (ref 0.0–0.5)
Eosinophils Relative: 1 %
HCT: 34.3 % — ABNORMAL LOW (ref 36.0–46.0)
Hemoglobin: 10.8 g/dL — ABNORMAL LOW (ref 12.0–15.0)
Immature Granulocytes: 0 %
Lymphocytes Relative: 52 %
Lymphs Abs: 1.3 10*3/uL (ref 0.7–4.0)
MCH: 28.1 pg (ref 26.0–34.0)
MCHC: 31.5 g/dL (ref 30.0–36.0)
MCV: 89.1 fL (ref 80.0–100.0)
Monocytes Absolute: 0.3 10*3/uL (ref 0.1–1.0)
Monocytes Relative: 14 %
Neutro Abs: 0.8 10*3/uL — ABNORMAL LOW (ref 1.7–7.7)
Neutrophils Relative %: 33 %
Platelet Count: 207 10*3/uL (ref 150–400)
RBC: 3.85 MIL/uL — ABNORMAL LOW (ref 3.87–5.11)
RDW: 15.6 % — ABNORMAL HIGH (ref 11.5–15.5)
WBC Count: 2.4 10*3/uL — ABNORMAL LOW (ref 4.0–10.5)
nRBC: 0 % (ref 0.0–0.2)

## 2020-09-29 LAB — RETICULOCYTES
Immature Retic Fract: 15.3 % (ref 2.3–15.9)
RBC.: 3.79 MIL/uL — ABNORMAL LOW (ref 3.87–5.11)
Retic Count, Absolute: 57.2 10*3/uL (ref 19.0–186.0)
Retic Ct Pct: 1.5 % (ref 0.4–3.1)

## 2020-09-29 LAB — CMP (CANCER CENTER ONLY)
ALT: 24 U/L (ref 0–44)
AST: 19 U/L (ref 15–41)
Albumin: 3.7 g/dL (ref 3.5–5.0)
Alkaline Phosphatase: 72 U/L (ref 38–126)
Anion gap: 6 (ref 5–15)
BUN: 13 mg/dL (ref 8–23)
CO2: 30 mmol/L (ref 22–32)
Calcium: 9.9 mg/dL (ref 8.9–10.3)
Chloride: 102 mmol/L (ref 98–111)
Creatinine: 0.75 mg/dL (ref 0.44–1.00)
GFR, Estimated: >60 mL/min (ref >60)
Glucose, Bld: 152 mg/dL — ABNORMAL HIGH (ref 70–99)
Potassium: 3.6 mmol/L (ref 3.5–5.1)
Sodium: 138 mmol/L (ref 135–145)
Total Bilirubin: 0.2 mg/dL — ABNORMAL LOW (ref 0.3–1.2)
Total Protein: 7.2 g/dL (ref 6.5–8.1)

## 2020-09-29 LAB — SAVE SMEAR(SSMR), FOR PROVIDER SLIDE REVIEW

## 2020-09-29 LAB — LACTATE DEHYDROGENASE: LDH: 170 U/L (ref 98–192)

## 2020-09-29 NOTE — Progress Notes (Signed)
Hematology and Oncology Follow Up Visit  Kristen Murray [id number] 05-23-1946 74 y.o. 09/29/2020   Principle Diagnosis:   Leukopenia-progressive  Iron deficiency anemia-malabsorption  Current Therapy:    IV iron as needed-dose of Feraheme given on 11/28/2019      Interim History:  Kristen Murray is back for follow-up.  It looks like she is going to need surgery on her right knee.  She apparently has a torn meniscus.  She has seen orthopedic surgery.  She is not sure when she will have surgery.  She is not sure if she wants to have surgery.  Her white cell count and hemoglobin are going down.  I think were going to have to do a bone marrow biopsy on her at this point.  Particularly, I want to make sure that she has an adequate bone marrow and bone marrow reserve if she is going to have surgery.  When we last saw her, her iron studies looked okay.  Her ferritin was 483 with an iron saturation of 40%.  It does not like she has had problems with her heart.  She is on Eliquis.  She is no longer taking medication for diabetes outside of glimepiride.   There has been no fever.  She has had no problems with bleeding.  There has been no change in bowel or bladder habits.  She has had no nausea or vomiting.  She has had no rashes.  There has been no leg swelling.  Currently, her performance status is ECOG 1.     Medications:  Current Outpatient Medications:  .  acetaminophen (TYLENOL) 325 MG tablet, Take 325-650 mg by mouth every 6 (six) hours as needed (for pain)., Disp: , Rfl:  .  apixaban (ELIQUIS) 5 MG TABS tablet, Take 1 tablet (5 mg total) by mouth 2 (two) times daily., Disp: 180 tablet, Rfl: 1 .  fluorometholone (FML) 0.1 % ophthalmic suspension, Place 1 drop into both eyes every Monday, Wednesday, and Friday. , Disp: , Rfl:  .  fluticasone (FLONASE) 50 MCG/ACT nasal spray, Place 1 spray into both nostrils 2 (two) times daily., Disp: 16 g, Rfl: 5 .  glimepiride (AMARYL) 1 MG tablet, Take  1 mg by mouth daily., Disp: , Rfl:  .  glucose blood (FREESTYLE LITE) test strip, CHECK BLOOD SUGAR ONCE DAILY AS DIRECTED.  DX:790.29, Disp: 100 each, Rfl: 12 .  hydrocortisone 1 % ointment, Apply 1 application topically 2 (two) times daily., Disp: 30 g, Rfl: 0 .  Lancets (FREESTYLE) lancets, Check blood sugar once daily as directed DX:790.29 (FREESTYLE FREEDOM LITE), Disp: 100 each, Rfl: 1 .  loratadine (CLARITIN) 10 MG tablet, Take 1 tablet (10 mg total) by mouth daily., Disp: 30 tablet, Rfl: 0 .  metoprolol tartrate (LOPRESSOR) 50 MG tablet, TAKE 1 AND 1/2 TABLETS BY  MOUTH TWICE DAILY, Disp: 270 tablet, Rfl: 3 .  montelukast (SINGULAIR) 10 MG tablet, Take 1 tablet (10 mg total) by mouth at bedtime., Disp: 30 tablet, Rfl: 5 .  Multiple Vitamin (MULTIVITAMIN) tablet, Take 1 tablet by mouth daily. Shaklee brand, Disp: , Rfl:  .  pantoprazole (PROTONIX) 40 MG tablet, Take 40 mg by mouth 2 (two) times daily., Disp: , Rfl:  .  rosuvastatin (CRESTOR) 5 MG tablet, Take 5 mg by mouth See admin instructions. Take 5 mg by mouth at bedtime on Mondays and Thursdays..Currently on hold, Disp: , Rfl:  .  traMADol (ULTRAM) 50 MG tablet, tramadol 50 mg tablet  Take 1 tablet every  6 hours by oral route as needed., Disp: , Rfl:  .  verapamil (CALAN-SR) 180 MG CR tablet, Take 180 mg by mouth at bedtime. , Disp: , Rfl:  .  cyclobenzaprine (FLEXERIL) 10 MG tablet, Take 2.5 mg by mouth daily as needed for muscle spasms.  (Patient not taking: Reported on 09/29/2020), Disp: , Rfl:   Allergies:  Allergies  Allergen Reactions  . Codeine Hives and Other (See Comments)    Because of a history of documented adverse serious drug reaction, Medi Alert bracelet  is recommended  . Macrodantin [Nitrofurantoin Macrocrystal] Other (See Comments)    Numbness, aching all over  . Other Shortness Of Breath, Rash and Other (See Comments)    Pine nuts & walnuts throat congestion. No documented angioedema. Avocado- Rash  .  Propoxyphene Hives, Swelling and Rash  . Propoxyphene Hcl Hives, Swelling and Rash  . Latex Rash  . Banana Rash  . 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 Tizanidine Other (See Comments)    Reaction not recalled  . Tramadol Nausea Only    Past Medical History, Surgical history, Social history, and Family History were reviewed and updated.  Review of Systems: Review of Systems  Constitutional: Negative.   HENT:  Negative.   Eyes: Negative.   Respiratory: Negative.   Cardiovascular: Negative.   Gastrointestinal: Negative.   Endocrine: Negative.   Genitourinary: Negative.    Musculoskeletal: Negative.   Skin: Negative.   Neurological: Negative.   Hematological: Negative.   Psychiatric/Behavioral: Negative.     Physical Exam:  weight is 169 lb (76.7 kg). Her oral temperature is 99 F (37.2 C). Her blood pressure is 138/58 (abnormal) and her pulse is 79. Her respiration is 20 and oxygen saturation is 99%.   Wt Readings from Last 3 Encounters:  09/29/20 169 lb (76.7 kg)  05/17/20 164 lb (74.4 kg)  05/12/20 164 lb 3.2 oz (74.5 kg)    Physical Exam Vitals reviewed.  HENT:     Head: Normocephalic and atraumatic.  Eyes:     Pupils: Pupils are equal, round, and reactive to light.  Cardiovascular:     Rate and Rhythm: Normal rate and regular rhythm.     Heart sounds: Normal heart sounds.  Pulmonary:     Effort: Pulmonary effort is normal.     Breath sounds: Normal breath sounds.  Abdominal:     General: Bowel sounds are normal.     Palpations: Abdomen is soft.  Musculoskeletal:        General: No tenderness or deformity. Normal range of motion.     Cervical back: Normal range of motion.     Comments: There is moderate swelling of the left calf area.  This is somewhat tender to palpation.  There is no redness.  I cannot palpate any obvious venous cord.  There is no pain behind the left knee.   She has decent pulses in her distal extremities.  Lymphadenopathy:     Cervical: No cervical adenopathy.  Skin:    General: Skin is warm and dry.     Findings: No erythema or rash.  Neurological:     Mental Status: She is alert and oriented to person, place, and time.  Psychiatric:        Behavior: Behavior normal.        Thought Content: Thought content normal.        Judgment: Judgment normal.  Lab Results  Component Value Date   WBC 2.4 (L) 09/29/2020   HGB 10.8 (L) 09/29/2020   HCT 34.3 (L) 09/29/2020   MCV 89.1 09/29/2020   PLT 207 09/29/2020     Chemistry      Component Value Date/Time   NA 138 09/29/2020 1320   NA 138 03/10/2020 1138   K 3.6 09/29/2020 1320   CL 102 09/29/2020 1320   CO2 30 09/29/2020 1320   BUN 13 09/29/2020 1320   BUN 10 03/10/2020 1138   CREATININE 0.75 09/29/2020 1320      Component Value Date/Time   CALCIUM 9.9 09/29/2020 1320   ALKPHOS 72 09/29/2020 1320   AST 19 09/29/2020 1320   ALT 24 09/29/2020 1320   BILITOT 0.2 (L) 09/29/2020 1320       Impression and Plan: Ms. Mcquitty is a 74 year old African-American female.  I just am not happy with the fact that her white cells keep trending downward.  This, in addition to her hemoglobin dropping is troublesome to me.  I think we are going to have to look at a bone marrow biopsy to make sure there is no underlying bone marrow disorder.  At her age, myelodysplasia is always a possibility.  At first when I first saw her, I thought that she may have had ethnic associated leukopenia.  There is concern is still be the case.  A bone marrow will help [place] with this.  We will see about doing the bone marrow test in a couple weeks.  I would like to see her back in January.  Again she is not sure when she is going to have surgery for her right knee.    [name], MD 12/1/20212:13 PM

## 2020-09-29 NOTE — Telephone Encounter (Signed)
appts made and calendar and avs printed per pt req...Marland KitchenAOM

## 2020-09-30 LAB — IRON AND TIBC
Iron: 18 ug/dL — ABNORMAL LOW (ref 41–142)
Saturation Ratios: 8 % — ABNORMAL LOW (ref 21–57)
TIBC: 223 ug/dL — ABNORMAL LOW (ref 236–444)
UIBC: 204 ug/dL (ref 120–384)

## 2020-09-30 LAB — FERRITIN: Ferritin: 390 ng/mL — ABNORMAL HIGH (ref 11–307)

## 2020-10-14 ENCOUNTER — Other Ambulatory Visit: Payer: Self-pay | Admitting: Radiology

## 2020-10-18 ENCOUNTER — Ambulatory Visit (HOSPITAL_COMMUNITY)
Admission: RE | Admit: 2020-10-18 | Discharge: 2020-10-18 | Disposition: A | Discharge status: Home / Self Care | Payer: Medicare Other | Source: Ambulatory Visit | Attending: Hematology & Oncology | Admitting: Hematology & Oncology

## 2020-10-18 ENCOUNTER — Encounter (HOSPITAL_COMMUNITY): Payer: Self-pay

## 2020-10-18 ENCOUNTER — Other Ambulatory Visit: Payer: Self-pay

## 2020-10-18 DIAGNOSIS — Z79899 Other long term (current) drug therapy: Secondary | ICD-10-CM | POA: Insufficient documentation

## 2020-10-18 DIAGNOSIS — D649 Anemia, unspecified: Secondary | ICD-10-CM | POA: Insufficient documentation

## 2020-10-18 DIAGNOSIS — D72819 Decreased white blood cell count, unspecified: Secondary | ICD-10-CM | POA: Diagnosis not present

## 2020-10-18 LAB — CBC WITH DIFFERENTIAL/PLATELET
Abs Immature Granulocytes: 0 10*3/uL (ref 0.00–0.07)
Basophils Absolute: 0 10*3/uL (ref 0.0–0.1)
Basophils Relative: 1 %
Eosinophils Absolute: 0 10*3/uL (ref 0.0–0.5)
Eosinophils Relative: 1 %
HCT: 36.5 % (ref 36.0–46.0)
Hemoglobin: 11.6 g/dL — ABNORMAL LOW (ref 12.0–15.0)
Immature Granulocytes: 0 %
Lymphocytes Relative: 65 %
Lymphs Abs: 1.5 10*3/uL (ref 0.7–4.0)
MCH: 27.5 pg (ref 26.0–34.0)
MCHC: 31.8 g/dL (ref 30.0–36.0)
MCV: 86.5 fL (ref 80.0–100.0)
Monocytes Absolute: 0.3 10*3/uL (ref 0.1–1.0)
Monocytes Relative: 12 %
Neutro Abs: 0.5 10*3/uL — ABNORMAL LOW (ref 1.7–7.7)
Neutrophils Relative %: 21 %
Platelets: 181 10*3/uL (ref 150–400)
RBC: 4.22 MIL/uL (ref 3.87–5.11)
RDW: 15.8 % — ABNORMAL HIGH (ref 11.5–15.5)
WBC: 2.2 10*3/uL — ABNORMAL LOW (ref 4.0–10.5)
nRBC: 0 % (ref 0.0–0.2)

## 2020-10-18 LAB — PROTIME-INR
INR: 1.4 — ABNORMAL HIGH (ref 0.8–1.2)
Prothrombin Time: 16.2 seconds — ABNORMAL HIGH (ref 11.4–15.2)

## 2020-10-18 LAB — GLUCOSE, CAPILLARY: Glucose-Capillary: 156 mg/dL — ABNORMAL HIGH (ref 70–99)

## 2020-10-18 MED ORDER — SODIUM CHLORIDE 0.9 % IV SOLN: INTRAVENOUS | Status: DC

## 2020-10-18 MED ORDER — MIDAZOLAM HCL 2 MG/2ML IJ SOLN
INTRAMUSCULAR | Status: AC
  Filled 2020-10-18: qty 2

## 2020-10-18 MED ORDER — FENTANYL CITRATE (PF) 100 MCG/2ML IJ SOLN
INTRAMUSCULAR | Status: AC | PRN
  Administered 2020-10-18: 25 ug via INTRAVENOUS
  Administered 2020-10-18: 50 ug via INTRAVENOUS
  Administered 2020-10-18: 25 ug via INTRAVENOUS

## 2020-10-18 MED ORDER — MIDAZOLAM HCL 2 MG/2ML IJ SOLN
INTRAMUSCULAR | Status: AC | PRN
  Administered 2020-10-18 (×2): 0.5 mg via INTRAVENOUS
  Administered 2020-10-18: 1 mg via INTRAVENOUS

## 2020-10-18 MED ORDER — LIDOCAINE-EPINEPHRINE (PF) 1 %-1:200000 IJ SOLN
INTRAMUSCULAR | Status: AC | PRN
  Administered 2020-10-18: 10 mL via INTRADERMAL

## 2020-10-18 MED ORDER — FENTANYL CITRATE (PF) 100 MCG/2ML IJ SOLN
INTRAMUSCULAR | Status: AC
  Filled 2020-10-18: qty 2

## 2020-10-18 NOTE — Procedures (Signed)
Pre-procedure Diagnosis: Progressive leukopenia and anemia of uncertain etiology  Post-procedure Diagnosis: Same  Technically successful CT guided bone marrow aspiration and biopsy of left iliac crest.   Complications: None Immediate  EBL: None  Signed: Sandi Mariscal Pager: [phone] 10/18/2020, 10:56 AM

## 2020-10-18 NOTE — Consult Note (Addendum)
Chief Complaint: Patient was seen in consultation today for CT-guided bone marrow biopsy   Referring Physician(s): Ennever,Peter R  Supervising Physician: Sandi Mariscal  Patient Status: [facility] - Out-pt  History of Present Illness: Kristen Murray is a 74 y.o. female with history of progressive leukopenia and anemia of uncertain etiology who presents today for CT-guided bone marrow biopsy for further evaluation.  Additional medical history as below.  Past Medical History:  Diagnosis Date  . Arthritis   . Borderline abnormal TFTs    as a teen  . 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 Diabetes mellitus without complication ([place])   . E. coli UTI 10/05/12   [facility]; resistant only to Septra DS & tetracycline  . Eye infection   . Fibromyalgia   . GERD (gastroesophageal reflux disease)   . Headache(784.0)   . 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 HSV-1 infection   . HTN (hypertension)   . Hyperglycemia   . Hyperlipidemia   . Meningioma ([place])   . Nephrolithiasis    Dr Amalia Hailey, [facility], Hx of [3][  . Polyp of colon   . Radiculopathy     Past Surgical History:  Procedure Laterality Date  . ABDOMINAL HYSTERECTOMY     TAH/BSO --fibroids, no cancer  . BALLOON DILATION N/A 07/20/2014   Procedure: BALLOON DILATION;  Surgeon: Garlan Fair, MD;  Location: [facility];  Service: Endoscopy;  Laterality: N/A;  . BREAST BIOPSY     Right  . CARPAL TUNNEL RELEASE     & trigger thumb RUE; Dr Alphonzo Cruise  . COLONOSCOPY W/ POLYPECTOMY  07/2004   Neg 2011; Dr Earle Gell  . CYSTOSCOPY  11/2009   Neg  . ESOPHAGOGASTRODUODENOSCOPY N/A 07/20/2014   Procedure: ESOPHAGOGASTRODUODENOSCOPY (EGD);  Surgeon: Garlan Fair, MD;  Location: [facility];  Service: Endoscopy;  Laterality: N/A;  . FLEXIBLE BRONCHOSCOPY W/ UPPER ENDOSCOPY     neg  . KNEE ARTHROSCOPY     Left  . ROTATOR CUFF REPAIR     R shoulder  . TONSILLECTOMY    . TRIGGER FINGER RELEASE Right     Right Thumb    Allergies: Codeine, Macrodantin [nitrofurantoin macrocrystal], Other, Propoxyphene, Propoxyphene hcl, Latex, Banana, Ciprofloxacin, Tizanidine, and Tramadol  Medications: Prior to Admission medications   Medication Sig Start Date End Date Taking? Authorizing Provider  apixaban (ELIQUIS) 5 MG TABS tablet Take 1 tablet (5 mg total) by mouth 2 (two) times daily. 05/21/20  Yes Nahser, Wonda Cheng, MD  diclofenac (VOLTAREN) 0.1 % ophthalmic solution 4 (four) times daily.   Yes [provider]  fluorometholone (FML) 0.1 % ophthalmic suspension Place 1 drop into both eyes every Monday, Wednesday, and Friday.    Yes [provider]  glimepiride (AMARYL) 1 MG tablet Take 1 mg by mouth daily. 08/06/20  Yes [provider]  metoprolol tartrate (LOPRESSOR) 50 MG tablet TAKE 1 AND 1/2 TABLETS BY  MOUTH TWICE DAILY 06/16/20  Yes Nahser, Wonda Cheng, MD  montelukast (SINGULAIR) 10 MG tablet Take 1 tablet (10 mg total) by mouth at bedtime. 09/30/19  Yes Kozlow, Donnamarie Poag, MD  Multiple Vitamin (MULTIVITAMIN) tablet Take 1 tablet by mouth daily. Shaklee brand   Yes [provider]  pantoprazole (PROTONIX) 40 MG tablet Take 40 mg by mouth 2 (two) times daily. 01/23/20  Yes [provider]  rosuvastatin (CRESTOR) 5 MG tablet Take 5 mg by mouth See admin instructions. Take 5 mg by mouth at bedtime  on Mondays and Thursdays..Currently on hold 11/20/19  Yes [provider]  traMADol (ULTRAM) 50 MG tablet tramadol 50 mg tablet  Take 1 tablet every 6 hours by oral route as needed.   Yes [provider]  trolamine salicylate (ASPERCREME) 10 % cream Apply 1 application topically as needed for muscle pain.   Yes [provider]  verapamil (CALAN-SR) 180 MG CR tablet Take 180 mg by mouth at bedtime.  01/02/17  Yes [provider]  acetaminophen (TYLENOL) 325 MG tablet Take 325-650 mg by mouth every 6 (six) hours as needed (for pain).    [provider]  cyclobenzaprine (FLEXERIL) 10 MG tablet Take 2.5 mg by mouth daily as needed for muscle spasms.  Patient not taking: Reported on 09/29/2020    [provider]  fluticasone (FLONASE) 50 MCG/ACT nasal spray Place 1 spray into both nostrils 2 (two) times daily. 04/01/19   Kozlow, Donnamarie Poag, MD  glucose blood (FREESTYLE LITE) test strip CHECK BLOOD SUGAR ONCE DAILY AS DIRECTED.  DX:790.29 01/09/14   Hendricks Limes, MD  hydrocortisone 1 % ointment Apply 1 application topically 2 (two) times daily. 04/13/20   Tamela Gammon, NP  Lancets (FREESTYLE) lancets Check blood sugar once daily as directed DX:790.29 (FREESTYLE FREEDOM LITE) 01/09/14   Hendricks Limes, MD  loratadine (CLARITIN) 10 MG tablet Take 1 tablet (10 mg total) by mouth daily. 01/10/20   Isla Pence, MD     Family History  Problem Relation Age of Onset  . Alcohol abuse Mother   . Diabetes Maternal Aunt   . Diabetes Maternal Uncle   . Diabetes Maternal Grandmother   . Tuberculosis Maternal Uncle   . Other Father        unknown  . Coronary artery disease Neg Hx     Social History   Socioeconomic History  . Marital status: Married    Spouse name: Not on file  . Number of children: Not on file  . Years of education: Not on file  . Highest education level: Not on file  Occupational History  . Occupation: Estate manager/land agent: [employer]  Tobacco Use  . Smoking status: Never Smoker  . Smokeless tobacco: Never Used  Vaping Use  . Vaping Use: Never used  Substance and Sexual Activity  . Alcohol use: No    Alcohol/week: 0.0 standard drinks  . Drug use: No  . Sexual activity: Not Currently    Birth control/protection: Post-menopausal    Comment: 1st intercourse- 20  partners - 1  Other Topics Concern  . Not on file  Social History Narrative   Regular Exercise-no         Social Determinants of Health   Financial Resource Strain: Not on file  Food Insecurity: Not on file   Transportation Needs: Not on file  Physical Activity: Not on file  Stress: Not on file  Social Connections: Not on file      Review of Systems currently denies fever, headache, chest pain, dyspnea, abdominal pain, back pain, nausea, vomiting or bleeding.  She does have occasional cough due to allergies  Vital Signs: BP (!) 146/79   Pulse 76   Temp 98.1 F (36.7 C) (Oral)   Resp 16   Ht $R'5\' 5"'xp$  (1.651 m)   Wt 169 lb (76.7 kg)   LMP 07/30/1990   SpO2 99%   BMI 28.12 kg/m   Physical Exam awake, alert.  Chest clear to auscultation  bilaterally.  Heart with regular rate and rhythm.  Abdomen soft, positive bowel sounds, nontender.  No significant right lower extremity edema, trace pretibial edema on left.  Imaging: No results found.  Labs:  CBC: Recent Labs    05/17/20 1411 09/08/20 1336 09/29/20 1320 10/18/20 0940  WBC 3.3* 3.1* 2.4* 2.2*  HGB 12.2 11.5* 10.8* 11.6*  HCT 39.2 35.9* 34.3* 36.5  PLT 177 166 207 181    COAGS: No results for input(s): INR, APTT in the last 8760 hours.  BMP: Recent Labs    01/28/20 0251 01/30/20 0524 03/10/20 1138 05/17/20 1411 09/08/20 1336 09/29/20 1320  NA 133* 137 138 138 139 138  K 3.8 4.1 4.5 4.1 4.5 3.6  CL 101 102 101 101 103 102  CO2 $Re'25 22 27 'WqW$ 32 31 30  GLUCOSE 182* 172* 152* 117* 60* 152*  BUN $Re'13 11 10 12 15 13  'LiY$ CALCIUM 9.0 9.8 9.4 10.2 10.0 9.9  CREATININE 0.62 0.80 0.74 0.70 0.62 0.75  GFRNONAA >60 >60 80 >60 >60 >60  GFRAA >60 >60 92 >60  --   --     LIVER FUNCTION TESTS: Recent Labs    01/30/20 0524 05/17/20 1411 09/08/20 1336 09/29/20 1320  BILITOT 0.4 0.4 0.3 0.2*  AST 16 10* 15 19  ALT $Re'14 9 14 24  'Ypt$ ALKPHOS 73 81 68 72  PROT 7.2 7.6 7.3 7.2  ALBUMIN 3.2* 4.2 3.9 3.7    TUMOR MARKERS: No results for input(s): AFPTM, CEA, CA199, CHROMGRNA in the last 8760 hours.  Assessment and Plan: 74 y.o. female with history of progressive leukopenia and anemia of uncertain etiology who presents today for  CT-guided bone marrow biopsy for further evaluation.Risks and benefits of procedure was discussed with the patient  including, but not limited to bleeding, infection, damage to adjacent structures or low yield requiring additional tests.  All of the questions were answered and there is agreement to proceed.  Consent signed and in chart.     Thank you for this interesting consult.  I greatly enjoyed meeting Kristen Murray and look forward to participating in their care.  A copy of this report was sent to the requesting provider on this date.  Electronically Signed: D. Rowe Robert, PA-C 10/18/2020, 10:05 AM   I spent a total of  20 minutes   in face to face in clinical consultation, greater than 50% of which was counseling/coordinating care for CT-guided bone marrow biopsy

## 2020-10-18 NOTE — Discharge Instructions (Signed)
For questions /concerns may call Interventional Radiology at [phone]  You may remove your dressing and shower tomorrow afternoon     Bone Marrow Aspiration and Bone Marrow Biopsy, Adult, Care After This sheet gives you information about how to care for yourself after your procedure. Your health care provider may also give you more specific instructions. If you have problems or questions, contact your health care provider. What can I expect after the procedure? After the procedure, it is common to have:  Mild pain and tenderness.  Swelling.  Bruising. Follow these instructions at home: Puncture site care   Follow instructions from your health care provider about how to take care of the puncture site. Make sure you: ? Wash your hands with soap and water before and after you change your bandage (dressing). If soap and water are not available, use hand sanitizer. ? Change your dressing as told by your health care provider.  Check your puncture site every day for signs of infection. Check for: ? More redness, swelling, or pain. ? Fluid or blood. ? Warmth. ? Pus or a bad smell. Activity  Return to your normal activities as told by your health care provider. Ask your health care provider what activities are safe for you.  Do not lift anything that is heavier than 10 lb (4.5 kg), or the limit that you are told, until your health care provider says that it is safe.  Do not drive for 24 hours if you were given a sedative during your procedure. General instructions   Take over-the-counter and prescription medicines only as told by your health care provider.  Do not take baths, swim, or use a hot tub until your health care provider approves. Ask your health care provider if you may take showers. You may only be allowed to take sponge baths.  If directed, put ice on the affected area. To do this: ? Put ice in a plastic bag. ? Place a towel between your skin and the  bag. ? Leave the ice on for 20 minutes, 2-3 times a day.  Keep all follow-up visits as told by your health care provider. This is important. Contact a health care provider if:  Your pain is not controlled with medicine.  You have a fever.  You have more redness, swelling, or pain around the puncture site.  You have fluid or blood coming from the puncture site.  Your puncture site feels warm to the touch.  You have pus or a bad smell coming from the puncture site. Summary  After the procedure, it is common to have mild pain, tenderness, swelling, and bruising.  Follow instructions from your health care provider about how to take care of the puncture site and what activities are safe for you.  Take over-the-counter and prescription medicines only as told by your health care provider.  Contact a health care provider if you have any signs of infection, such as fluid or blood coming from the puncture site. This information is not intended to replace advice given to you by your health care provider. Make sure you discuss any questions you have with your health care provider. Document Revised: 03/04/2019 Document Reviewed: 03/04/2019 Elsevier Patient Education  [place]. Moderate Conscious Sedation, Adult, Care After These instructions provide you with information about caring for yourself after your procedure. Your health care provider may also give you more specific instructions. Your treatment has been planned according to current medical practices, but problems sometimes occur. Call  your health care provider if you have any problems or questions after your procedure. What can I expect after the procedure? After your procedure, it is common:  To feel sleepy for several hours.  To feel clumsy and have poor balance for several hours.  To have poor judgment for several hours.  To vomit if you eat too soon. Follow these instructions at home: For at least 24 hours after the  procedure:   Do not: ? Participate in activities where you could fall or become injured. ? Drive. ? Use heavy machinery. ? Drink alcohol. ? Take sleeping pills or medicines that cause drowsiness. ? Make important decisions or sign legal documents. ? Take care of children on your own.  Rest. Eating and drinking  Follow the diet recommended by your health care provider.  If you vomit: ? Drink water, juice, or soup when you can drink without vomiting. ? Make sure you have little or no nausea before eating solid foods. General instructions  Have a responsible adult stay with you until you are awake and alert.  Take over-the-counter and prescription medicines only as told by your health care provider.  If you smoke, do not smoke without supervision.  Keep all follow-up visits as told by your health care provider. This is important. Contact a health care provider if:  You keep feeling nauseous or you keep vomiting.  You feel light-headed.  You develop a rash.  You have a fever. Get help right away if:  You have trouble breathing. This information is not intended to replace advice given to you by your health care provider. Make sure you discuss any questions you have with your health care provider. Document Revised: 09/28/2017 Document Reviewed: 02/05/2016 Elsevier Patient Education  2020 [employer].

## 2020-10-21 LAB — SURGICAL PATHOLOGY

## 2020-10-27 ENCOUNTER — Encounter (HOSPITAL_COMMUNITY): Payer: Self-pay | Admitting: Hematology & Oncology

## 2020-11-05 ENCOUNTER — Telehealth: Payer: Self-pay

## 2020-11-05 ENCOUNTER — Encounter (HOSPITAL_COMMUNITY): Payer: Self-pay | Admitting: Hematology & Oncology

## 2020-11-05 LAB — SURGICAL PATHOLOGY

## 2020-11-05 NOTE — Telephone Encounter (Signed)
Patient called and left message stating she wanted to know about next steps from her bone marrow biopsy, appt scheduled 1/20. Per MD he will call her this evening to go over results. Called patient and informed her. She verbalized understanding and denies any other questions or concerns at this time.

## 2020-11-09 ENCOUNTER — Ambulatory Visit: Payer: Medicare Other | Admitting: Physician Assistant

## 2020-11-18 ENCOUNTER — Encounter: Payer: Self-pay | Admitting: Hematology & Oncology

## 2020-11-18 ENCOUNTER — Inpatient Hospital Stay (HOSPITAL_BASED_OUTPATIENT_CLINIC_OR_DEPARTMENT_OTHER): Payer: Medicare Other | Admitting: Hematology & Oncology

## 2020-11-18 ENCOUNTER — Other Ambulatory Visit: Payer: Self-pay

## 2020-11-18 ENCOUNTER — Inpatient Hospital Stay: Payer: Medicare Other | Attending: Hematology & Oncology

## 2020-11-18 VITALS — BP 152/67 | HR 79 | Temp 98.1°F | Resp 16 | Wt 170.0 lb

## 2020-11-18 DIAGNOSIS — D4620 Refractory anemia with excess of blasts, unspecified: Secondary | ICD-10-CM

## 2020-11-18 DIAGNOSIS — D469 Myelodysplastic syndrome, unspecified: Secondary | ICD-10-CM | POA: Diagnosis not present

## 2020-11-18 DIAGNOSIS — D72819 Decreased white blood cell count, unspecified: Secondary | ICD-10-CM

## 2020-11-18 DIAGNOSIS — Z7901 Long term (current) use of anticoagulants: Secondary | ICD-10-CM | POA: Insufficient documentation

## 2020-11-18 DIAGNOSIS — D46Z Other myelodysplastic syndromes: Secondary | ICD-10-CM

## 2020-11-18 DIAGNOSIS — K909 Intestinal malabsorption, unspecified: Secondary | ICD-10-CM | POA: Insufficient documentation

## 2020-11-18 DIAGNOSIS — Z79899 Other long term (current) drug therapy: Secondary | ICD-10-CM | POA: Insufficient documentation

## 2020-11-18 DIAGNOSIS — D508 Other iron deficiency anemias: Secondary | ICD-10-CM | POA: Insufficient documentation

## 2020-11-18 DIAGNOSIS — I519 Heart disease, unspecified: Secondary | ICD-10-CM | POA: Diagnosis not present

## 2020-11-18 HISTORY — DX: Refractory anemia with excess of blasts, unspecified: D46.20

## 2020-11-18 HISTORY — DX: Other myelodysplastic syndromes: D46.Z

## 2020-11-18 LAB — CMP (CANCER CENTER ONLY)
ALT: 14 U/L (ref 0–44)
AST: 16 U/L (ref 15–41)
Albumin: 4 g/dL (ref 3.5–5.0)
Alkaline Phosphatase: 79 U/L (ref 38–126)
Anion gap: 6 (ref 5–15)
BUN: 14 mg/dL (ref 8–23)
CO2: 29 mmol/L (ref 22–32)
Calcium: 10.2 mg/dL (ref 8.9–10.3)
Chloride: 104 mmol/L (ref 98–111)
Creatinine: 0.76 mg/dL (ref 0.44–1.00)
GFR, Estimated: >60 mL/min (ref >60)
Glucose, Bld: 112 mg/dL — ABNORMAL HIGH (ref 70–99)
Potassium: 3.9 mmol/L (ref 3.5–5.1)
Sodium: 139 mmol/L (ref 135–145)
Total Bilirubin: 0.3 mg/dL (ref 0.3–1.2)
Total Protein: 7.6 g/dL (ref 6.5–8.1)

## 2020-11-18 LAB — CBC WITH DIFFERENTIAL (CANCER CENTER ONLY)
Abs Immature Granulocytes: 0.01 10*3/uL (ref 0.00–0.07)
Basophils Absolute: 0 10*3/uL (ref 0.0–0.1)
Basophils Relative: 0 %
Eosinophils Absolute: 0 10*3/uL (ref 0.0–0.5)
Eosinophils Relative: 0 %
HCT: 35.8 % — ABNORMAL LOW (ref 36.0–46.0)
Hemoglobin: 11.4 g/dL — ABNORMAL LOW (ref 12.0–15.0)
Immature Granulocytes: 0 %
Lymphocytes Relative: 67 %
Lymphs Abs: 1.8 10*3/uL (ref 0.7–4.0)
MCH: 26.9 pg (ref 26.0–34.0)
MCHC: 31.8 g/dL (ref 30.0–36.0)
MCV: 84.4 fL (ref 80.0–100.0)
Monocytes Absolute: 0.3 10*3/uL (ref 0.1–1.0)
Monocytes Relative: 11 %
Neutro Abs: 0.6 10*3/uL — ABNORMAL LOW (ref 1.7–7.7)
Neutrophils Relative %: 22 %
Platelet Count: 202 10*3/uL (ref 150–400)
RBC: 4.24 MIL/uL (ref 3.87–5.11)
RDW: 16.3 % — ABNORMAL HIGH (ref 11.5–15.5)
WBC Count: 2.7 10*3/uL — ABNORMAL LOW (ref 4.0–10.5)
nRBC: 0 % (ref 0.0–0.2)

## 2020-11-18 LAB — SAVE SMEAR(SSMR), FOR PROVIDER SLIDE REVIEW

## 2020-11-18 LAB — RETIC PANEL
Immature Retic Fract: 17.8 % — ABNORMAL HIGH (ref 2.3–15.9)
RBC.: 4.2 MIL/uL (ref 3.87–5.11)
Retic Count, Absolute: 35.7 10*3/uL (ref 19.0–186.0)
Retic Ct Pct: 0.9 % (ref 0.4–3.1)
Reticulocyte Hemoglobin: 34.4 pg (ref >27.9)

## 2020-11-18 LAB — LACTATE DEHYDROGENASE: LDH: 168 U/L (ref 98–192)

## 2020-11-18 NOTE — Progress Notes (Signed)
Hematology and Oncology Follow Up Visit  Kristen Murray [id number] Jul 31, 1946 75 y.o. 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  We did have to get a bone marrow biopsy on her.  This was done on 10/18/2020.  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  This was consistent with myelodysplasia.  There were no blasts noted.  We did do cytogenetics.  She had 1 abnormality which was not clear.  She had a 14q rearrangement.  We did do FISH on this which was not found.  On her IPSS-R score, this was only 1.5 which would put this at very low risk.  The problem is that her white cell count is on the low side.  It sounds like she might be eventually having surgery for her right knee.  She had put this off until we figured out what was going on with her blood.  As of right now, I do not see a problem with her having knee surgery.  I think that we should try her on Neulasta on to see if this may not get the white cell count up a little bit.  We will send off an erythropoietin level on her.  I do need to get the myelodysplasia NGS panel to see if this might help [place] out.  She has had no fever.  She has had no rashes.  There is been no change in bowel or bladder habits.  She has had no bleeding.  Overall, I would say her performance status is ECOG 1.   Medications:  Current Outpatient Medications:  .  acetaminophen (TYLENOL) 325 MG tablet, Take 325-650 mg by mouth every 6 (six) hours as needed (for pain)., Disp: , Rfl:  .  apixaban (ELIQUIS) 5 MG TABS tablet, Take 1 tablet (5 mg total) by mouth 2 (two) times daily., Disp: 180 tablet, Rfl: 1 .  cyclobenzaprine (FLEXERIL) 10 MG tablet, Take 2.5 mg by mouth  daily as needed for muscle spasms.  (Patient not taking: Reported on 09/29/2020), Disp: , Rfl:  .  diclofenac (VOLTAREN) 0.1 % ophthalmic solution, 4 (four) times daily., Disp: , Rfl:  .  fluorometholone (FML) 0.1 % ophthalmic suspension, Place 1 drop into both eyes every Monday, Wednesday, and Friday. , Disp: , Rfl:  .  fluticasone (FLONASE) 50 MCG/ACT nasal spray, Place 1 spray into both nostrils 2 (two) times daily., Disp: 16 g, Rfl: 5 .  glimepiride (AMARYL) 1 MG tablet, Take 1 mg by mouth daily., Disp: , Rfl:  .  glucose blood (FREESTYLE LITE) test strip, CHECK BLOOD SUGAR ONCE DAILY AS DIRECTED.  DX:790.29, Disp: 100 each, Rfl: 12 .  hydrocortisone 1 % ointment, Apply 1 application topically 2 (two) times daily., Disp: 30 g, Rfl: 0 .  Lancets (FREESTYLE) lancets, Check blood sugar once daily as directed DX:790.29 (FREESTYLE FREEDOM LITE), Disp: 100 each, Rfl: 1 .  loratadine (CLARITIN) 10 MG tablet, Take 1 tablet (10 mg total) by mouth daily., Disp: 30 tablet, Rfl: 0 .  metoprolol tartrate (LOPRESSOR) 50 MG tablet, TAKE 1 AND 1/2 TABLETS BY  MOUTH TWICE DAILY, Disp: 270 tablet, Rfl: 3 .  montelukast (SINGULAIR) 10 MG tablet, Take 1 tablet (10 mg total)  by mouth at bedtime., Disp: 30 tablet, Rfl: 5 .  Multiple Vitamin (MULTIVITAMIN) tablet, Take 1 tablet by mouth daily. Shaklee brand, Disp: , Rfl:  .  pantoprazole (PROTONIX) 40 MG tablet, Take 40 mg by mouth 2 (two) times daily., Disp: , Rfl:  .  rosuvastatin (CRESTOR) 5 MG tablet, Take 5 mg by mouth See admin instructions. Take 5 mg by mouth at bedtime on Mondays and Thursdays..Currently on hold, Disp: , Rfl:  .  traMADol (ULTRAM) 50 MG tablet, tramadol 50 mg tablet  Take 1 tablet every 6 hours by oral route as needed., Disp: , Rfl:  .  trolamine salicylate (ASPERCREME) 10 % cream, Apply 1 application topically as needed for muscle pain., Disp: , Rfl:  .  verapamil (CALAN-SR) 180 MG CR tablet, Take 180 mg by mouth at bedtime. , Disp: , Rfl:    Allergies:  Allergies  Allergen Reactions  . Codeine Hives and Other (See Comments)    Because of a history of documented adverse serious drug reaction, Medi Alert bracelet  is recommended  . Macrodantin [Nitrofurantoin Macrocrystal] Other (See Comments)    Numbness, aching all over  . Other Shortness Of Breath, Rash and Other (See Comments)    Pine nuts & walnuts throat congestion. No documented angioedema. Avocado- Rash  . Propoxyphene Hives, Swelling and Rash  . Propoxyphene Hcl Hives, Swelling and Rash  . Latex Rash  . Banana Rash  . 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 Tizanidine Other (See Comments)    Reaction not recalled  . Tramadol Nausea Only    Past Medical History, Surgical history, Social history, and Family History were reviewed and updated.  Review of Systems: Review of Systems  Constitutional: Negative.   HENT:  Negative.   Eyes: Negative.   Respiratory: Negative.   Cardiovascular: Negative.   Gastrointestinal: Negative.   Endocrine: Negative.   Genitourinary: Negative.    Musculoskeletal: Negative.   Skin: Negative.   Neurological: Negative.   Hematological: Negative.   Psychiatric/Behavioral: Negative.     Physical Exam:  weight is 170 lb (77.1 kg). Her oral temperature is 98.1 F (36.7 C). Her blood pressure is 152/67 (abnormal) and her pulse is 79. Her respiration is 16 and oxygen saturation is 100%.   Wt Readings from Last 3 Encounters:  11/18/20 170 lb (77.1 kg)  10/18/20 169 lb (76.7 kg)  09/29/20 169 lb (76.7 kg)    Physical Exam Vitals reviewed.  HENT:     Head: Normocephalic and atraumatic.  Eyes:     Pupils: Pupils are equal, round, and reactive to light.  Cardiovascular:     Rate and Rhythm: Normal rate and regular rhythm.     Heart sounds: Normal heart sounds.  Pulmonary:     Effort: Pulmonary effort is normal.     Breath sounds: Normal  breath sounds.  Abdominal:     General: Bowel sounds are normal.     Palpations: Abdomen is soft.  Musculoskeletal:        General: No tenderness or deformity. Normal range of motion.     Cervical back: Normal range of motion.     Comments: There is moderate swelling of the left calf area.  This is somewhat tender to palpation.  There is no redness.  I cannot palpate any obvious venous cord.  There is no pain behind the left knee.  She has decent pulses in  her distal extremities.  Lymphadenopathy:     Cervical: No cervical adenopathy.  Skin:    General: Skin is warm and dry.     Findings: No erythema or rash.  Neurological:     Mental Status: She is alert and oriented to person, place, and time.  Psychiatric:        Behavior: Behavior normal.        Thought Content: Thought content normal.        Judgment: Judgment normal.      Lab Results  Component Value Date   WBC 2.7 (L) 11/18/2020   HGB 11.4 (L) 11/18/2020   HCT 35.8 (L) 11/18/2020   MCV 84.4 11/18/2020   PLT 202 11/18/2020     Chemistry      Component Value Date/Time   NA 139 11/18/2020 1428   NA 138 03/10/2020 1138   K 3.9 11/18/2020 1428   CL 104 11/18/2020 1428   CO2 29 11/18/2020 1428   BUN 14 11/18/2020 1428   BUN 10 03/10/2020 1138   CREATININE 0.76 11/18/2020 1428      Component Value Date/Time   CALCIUM 10.2 11/18/2020 1428   ALKPHOS 79 11/18/2020 1428   AST 16 11/18/2020 1428   ALT 14 11/18/2020 1428   BILITOT 0.3 11/18/2020 1428       Impression and Plan: Ms. Baldonado is a 75 year old African-American female.   Looks like she does have a low-grade myelodysplasia.  Again she has a very low IPSS-R score.  We certainly do not have to intervene with therapy right now.  Again we will try her on Neulasta to see if this might not be able to get her white cell count up a little bit.  She will make an appointment to see the orthopedic surgeon about her knee.  We will certainly get her through surgery  safely.  She is on Eliquis for cardiac issues.  I am sure that the cardiologist will also be involved.  I would just be surprised if we ever had any issues with this myelodysplasia.  We will certainly follow this along.  I spent a good 45 minutes with her today.  I went over the bone marrow report.  I went over all of my recommendations.  I reassured her that this was not going to be a major issue with respect to her life.  I would like to see her back in about 3 or 4 weeks.    [name], MD 1/20/20225:34 PM

## 2020-11-19 ENCOUNTER — Telehealth: Payer: Self-pay | Admitting: Hematology & Oncology

## 2020-11-19 LAB — IRON AND TIBC
Iron: 24 ug/dL — ABNORMAL LOW (ref 41–142)
Saturation Ratios: 10 % — ABNORMAL LOW (ref 21–57)
TIBC: 250 ug/dL (ref 236–444)
UIBC: 226 ug/dL (ref 120–384)

## 2020-11-19 LAB — FERRITIN: Ferritin: 396 ng/mL — ABNORMAL HIGH (ref 11–307)

## 2020-11-19 NOTE — Telephone Encounter (Signed)
Called and spoke with patient regarding appointments scheduled per 1/20 los.  She will need [place] as well and the imaging will call her for this appointment once insurance has been auth'd.  She understood these instructions.

## 2020-11-24 ENCOUNTER — Ambulatory Visit: Payer: Medicare Other | Admitting: Physician Assistant

## 2020-11-25 ENCOUNTER — Ambulatory Visit: Payer: Medicare Other

## 2020-11-25 ENCOUNTER — Inpatient Hospital Stay (HOSPITAL_BASED_OUTPATIENT_CLINIC_OR_DEPARTMENT_OTHER): Admission: RE | Admit: 2020-11-25 | Payer: Medicare Other | Source: Ambulatory Visit

## 2020-11-29 ENCOUNTER — Other Ambulatory Visit (HOSPITAL_BASED_OUTPATIENT_CLINIC_OR_DEPARTMENT_OTHER): Payer: Medicare Other

## 2020-12-01 ENCOUNTER — Ambulatory Visit
Admission: RE | Admit: 2020-12-01 | Discharge: 2020-12-01 | Disposition: A | Discharge status: Home / Self Care | Payer: Medicare Other | Source: Ambulatory Visit | Attending: Internal Medicine | Admitting: Internal Medicine

## 2020-12-01 ENCOUNTER — Telehealth: Payer: Self-pay

## 2020-12-01 ENCOUNTER — Other Ambulatory Visit: Payer: Self-pay | Admitting: Internal Medicine

## 2020-12-01 ENCOUNTER — Other Ambulatory Visit: Payer: Self-pay

## 2020-12-01 DIAGNOSIS — R42 Dizziness and giddiness: Secondary | ICD-10-CM

## 2020-12-01 NOTE — Telephone Encounter (Signed)
Patient called and asked if she was okay to have her injection and iron on Friday if she was just placed on meclizine for vertigo Called patient back and informed her that that was okay for her to take. She verbalized understanding and denies any questions or concerns at this time.

## 2020-12-02 ENCOUNTER — Ambulatory Visit (HOSPITAL_BASED_OUTPATIENT_CLINIC_OR_DEPARTMENT_OTHER)
Admission: RE | Admit: 2020-12-02 | Discharge: 2020-12-02 | Disposition: A | Discharge status: Home / Self Care | Payer: Medicare Other | Source: Ambulatory Visit | Attending: Hematology & Oncology | Admitting: Hematology & Oncology

## 2020-12-02 DIAGNOSIS — D72819 Decreased white blood cell count, unspecified: Secondary | ICD-10-CM | POA: Insufficient documentation

## 2020-12-03 ENCOUNTER — Inpatient Hospital Stay: Payer: Medicare Other

## 2020-12-03 ENCOUNTER — Telehealth: Payer: Self-pay | Admitting: *Deleted

## 2020-12-03 NOTE — Telephone Encounter (Signed)
Notified pt of results. Pt verbalized understanding. No concerns at this time. 

## 2020-12-03 NOTE — Telephone Encounter (Signed)
-----   Message from [name], MD sent at 12/02/2020  5:31 PM EST ----- Call - the ultrasound looks ok!!  No enlarged spleen!!  Laurey Arrow

## 2020-12-06 ENCOUNTER — Encounter: Payer: Self-pay | Admitting: Hematology & Oncology

## 2020-12-06 ENCOUNTER — Inpatient Hospital Stay: Payer: Medicare Other

## 2020-12-06 ENCOUNTER — Other Ambulatory Visit: Payer: Self-pay

## 2020-12-06 ENCOUNTER — Inpatient Hospital Stay: Payer: Medicare Other | Attending: Hematology & Oncology

## 2020-12-06 ENCOUNTER — Inpatient Hospital Stay: Payer: Medicare Other | Admitting: Hematology & Oncology

## 2020-12-06 VITALS — BP 140/50 | HR 76 | Temp 97.7°F | Resp 18 | Wt 172.0 lb

## 2020-12-06 DIAGNOSIS — D469 Myelodysplastic syndrome, unspecified: Secondary | ICD-10-CM | POA: Insufficient documentation

## 2020-12-06 DIAGNOSIS — D500 Iron deficiency anemia secondary to blood loss (chronic): Secondary | ICD-10-CM

## 2020-12-06 DIAGNOSIS — D4620 Refractory anemia with excess of blasts, unspecified: Secondary | ICD-10-CM

## 2020-12-06 DIAGNOSIS — D72819 Decreased white blood cell count, unspecified: Secondary | ICD-10-CM

## 2020-12-06 LAB — LACTATE DEHYDROGENASE: LDH: 167 U/L (ref 98–192)

## 2020-12-06 LAB — CBC WITH DIFFERENTIAL (CANCER CENTER ONLY)
Abs Immature Granulocytes: 0 10*3/uL (ref 0.00–0.07)
Basophils Absolute: 0 10*3/uL (ref 0.0–0.1)
Basophils Relative: 0 %
Eosinophils Absolute: 0 10*3/uL (ref 0.0–0.5)
Eosinophils Relative: 0 %
HCT: 36 % (ref 36.0–46.0)
Hemoglobin: 11.7 g/dL — ABNORMAL LOW (ref 12.0–15.0)
Immature Granulocytes: 0 %
Lymphocytes Relative: 68 %
Lymphs Abs: 1.7 10*3/uL (ref 0.7–4.0)
MCH: 27.1 pg (ref 26.0–34.0)
MCHC: 32.5 g/dL (ref 30.0–36.0)
MCV: 83.3 fL (ref 80.0–100.0)
Monocytes Absolute: 0.2 10*3/uL (ref 0.1–1.0)
Monocytes Relative: 9 %
Neutro Abs: 0.6 10*3/uL — ABNORMAL LOW (ref 1.7–7.7)
Neutrophils Relative %: 23 %
Platelet Count: 188 10*3/uL (ref 150–400)
RBC: 4.32 MIL/uL (ref 3.87–5.11)
RDW: 16.4 % — ABNORMAL HIGH (ref 11.5–15.5)
WBC Count: 2.5 10*3/uL — ABNORMAL LOW (ref 4.0–10.5)
nRBC: 0 % (ref 0.0–0.2)

## 2020-12-06 LAB — CMP (CANCER CENTER ONLY)
ALT: 14 U/L (ref 0–44)
AST: 13 U/L — ABNORMAL LOW (ref 15–41)
Albumin: 4.1 g/dL (ref 3.5–5.0)
Alkaline Phosphatase: 87 U/L (ref 38–126)
Anion gap: 6 (ref 5–15)
BUN: 13 mg/dL (ref 8–23)
CO2: 30 mmol/L (ref 22–32)
Calcium: 10.1 mg/dL (ref 8.9–10.3)
Chloride: 102 mmol/L (ref 98–111)
Creatinine: 0.69 mg/dL (ref 0.44–1.00)
GFR, Estimated: >60 mL/min (ref >60)
Glucose, Bld: 152 mg/dL — ABNORMAL HIGH (ref 70–99)
Potassium: 4.5 mmol/L (ref 3.5–5.1)
Sodium: 138 mmol/L (ref 135–145)
Total Bilirubin: 0.5 mg/dL (ref 0.3–1.2)
Total Protein: 7.4 g/dL (ref 6.5–8.1)

## 2020-12-06 LAB — SAVE SMEAR(SSMR), FOR PROVIDER SLIDE REVIEW

## 2020-12-06 LAB — RETICULOCYTES
Immature Retic Fract: 13 % (ref 2.3–15.9)
RBC.: 4.24 MIL/uL (ref 3.87–5.11)
Retic Count, Absolute: 78.4 10*3/uL (ref 19.0–186.0)
Retic Ct Pct: 1.9 % (ref 0.4–3.1)

## 2020-12-06 MED ORDER — SODIUM CHLORIDE 0.9 % IV SOLN
200.0000 mg | Freq: Once | INTRAVENOUS | Status: AC
  Administered 2020-12-06: 200 mg via INTRAVENOUS
  Filled 2020-12-06: qty 200

## 2020-12-06 MED ORDER — PEGFILGRASTIM-BMEZ 6 MG/0.6ML ~~LOC~~ SOSY
PREFILLED_SYRINGE | SUBCUTANEOUS | Status: AC
  Filled 2020-12-06: qty 0.6

## 2020-12-06 MED ORDER — SODIUM CHLORIDE 0.9 % IV SOLN
Freq: Once | INTRAVENOUS | Status: AC
  Filled 2020-12-06: qty 250

## 2020-12-06 MED ORDER — PEGFILGRASTIM-BMEZ 6 MG/0.6ML ~~LOC~~ SOSY
6.0000 mg | PREFILLED_SYRINGE | Freq: Once | SUBCUTANEOUS | Status: AC
  Administered 2020-12-06: 6 mg via SUBCUTANEOUS

## 2020-12-06 NOTE — Patient Instructions (Signed)
Pegfilgrastim injection What is this medicine? PEGFILGRASTIM (PEG fil gra stim) is a long-acting granulocyte colony-stimulating factor that stimulates the growth of neutrophils, a type of white blood cell important in the body's fight against infection. It is used to reduce the incidence of fever and infection in patients with certain types of cancer who are receiving chemotherapy that affects the bone marrow, and to increase survival after being exposed to high doses of radiation. This medicine may be used for other purposes; ask your health care provider or pharmacist if you have questions. COMMON BRAND NAME(S): Fulphila, Neulasta, Nyvepria, UDENYCA, Ziextenzo What should I tell my health care provider before I take this medicine? They need to know if you have any of these conditions:  kidney disease  latex allergy  ongoing radiation therapy  sickle cell disease  skin reactions to acrylic adhesives (On-Body Injector only)  an unusual or allergic reaction to pegfilgrastim, filgrastim, other medicines, foods, dyes, or preservatives  pregnant or trying to get pregnant  breast-feeding How should I use this medicine? This medicine is for injection under the skin. If you get this medicine at home, you will be taught how to prepare and give the pre-filled syringe or how to use the On-body Injector. Refer to the patient Instructions for Use for detailed instructions. Use exactly as directed. Tell your healthcare provider immediately if you suspect that the On-body Injector may not have performed as intended or if you suspect the use of the On-body Injector resulted in a missed or partial dose. It is important that you put your used needles and syringes in a special sharps container. Do not put them in a trash can. If you do not have a sharps container, call your pharmacist or healthcare provider to get one. Talk to your pediatrician regarding the use of this medicine in children. While this drug  may be prescribed for selected conditions, precautions do apply. Overdosage: If you think you have taken too much of this medicine contact a poison control center or emergency room at once. NOTE: This medicine is only for you. Do not share this medicine with others. What if I miss a dose? It is important not to miss your dose. Call your doctor or health care professional if you miss your dose. If you miss a dose due to an On-body Injector failure or leakage, a new dose should be administered as soon as possible using a single prefilled syringe for manual use. What may interact with this medicine? Interactions have not been studied. This list may not describe all possible interactions. Give your health care provider a list of all the medicines, herbs, non-prescription drugs, or dietary supplements you use. Also tell them if you smoke, drink alcohol, or use illegal drugs. Some items may interact with your medicine. What should I watch for while using this medicine? Your condition will be monitored carefully while you are receiving this medicine. You may need blood work done while you are taking this medicine. Talk to your health care provider about your risk of cancer. You may be more at risk for certain types of cancer if you take this medicine. If you are going to need a MRI, CT scan, or other procedure, tell your doctor that you are using this medicine (On-Body Injector only). What side effects may I notice from receiving this medicine? Side effects that you should report to your doctor or health care professional as soon as possible:  allergic reactions (skin rash, itching or hives, swelling of   the face, lips, or tongue)  back pain  dizziness  fever  pain, redness, or irritation at site where injected  pinpoint red spots on the skin  red or dark-brown urine  shortness of breath or breathing problems  stomach or side pain, or pain at the shoulder  swelling  tiredness  trouble  passing urine or change in the amount of urine  unusual bruising or bleeding Side effects that usually do not require medical attention (report to your doctor or health care professional if they continue or are bothersome):  bone pain  muscle pain This list may not describe all possible side effects. Call your doctor for medical advice about side effects. You may report side effects to FDA at 1-800-FDA-1088. Where should I keep my medicine? Keep out of the reach of children. If you are using this medicine at home, you will be instructed on how to store it. Throw away any unused medicine after the expiration date on the label. NOTE: This sheet is a summary. It may not cover all possible information. If you have questions about this medicine, talk to your doctor, pharmacist, or health care provider.  2021 Elsevier/Gold Standard (2019-11-07 13:20:51) Iron Sucrose injection What is this medicine? IRON SUCROSE (AHY ern SOO krohs) is an iron complex. Iron is used to make healthy red blood cells, which carry oxygen and nutrients throughout the body. This medicine is used to treat iron deficiency anemia in people with chronic kidney disease. This medicine may be used for other purposes; ask your health care provider or pharmacist if you have questions. COMMON BRAND NAME(S): Venofer What should I tell my health care provider before I take this medicine? They need to know if you have any of these conditions:  anemia not caused by low iron levels  heart disease  high levels of iron in the blood  kidney disease  liver disease  an unusual or allergic reaction to iron, other medicines, foods, dyes, or preservatives  pregnant or trying to get pregnant  breast-feeding How should I use this medicine? This medicine is for infusion into a vein. It is given by a health care professional in a hospital or clinic setting. Talk to your pediatrician regarding the use of this medicine in children.  While this drug may be prescribed for children as young as 2 years for selected conditions, precautions do apply. Overdosage: If you think you have taken too much of this medicine contact a poison control center or emergency room at once. NOTE: This medicine is only for you. Do not share this medicine with others. What if I miss a dose? It is important not to miss your dose. Call your doctor or health care professional if you are unable to keep an appointment. What may interact with this medicine? Do not take this medicine with any of the following medications:  deferoxamine  dimercaprol  other iron products This medicine may also interact with the following medications:  chloramphenicol  deferasirox This list may not describe all possible interactions. Give your health care provider a list of all the medicines, herbs, non-prescription drugs, or dietary supplements you use. Also tell them if you smoke, drink alcohol, or use illegal drugs. Some items may interact with your medicine. What should I watch for while using this medicine? Visit your doctor or healthcare professional regularly. Tell your doctor or healthcare professional if your symptoms do not start to get better or if they get worse. You may need blood work done while  you are taking this medicine. You may need to follow a special diet. Talk to your doctor. Foods that contain iron include: whole grains/cereals, dried fruits, beans, or peas, leafy green vegetables, and organ meats (liver, kidney). What side effects may I notice from receiving this medicine? Side effects that you should report to your doctor or health care professional as soon as possible:  allergic reactions like skin rash, itching or hives, swelling of the face, lips, or tongue  breathing problems  changes in blood pressure  cough  fast, irregular heartbeat  feeling faint or lightheaded, falls  fever or chills  flushing, sweating, or hot  feelings  joint or muscle aches/pains  seizures  swelling of the ankles or feet  unusually weak or tired Side effects that usually do not require medical attention (report to your doctor or health care professional if they continue or are bothersome):  diarrhea  feeling achy  headache  irritation at site where injected  nausea, vomiting  stomach upset  tiredness This list may not describe all possible side effects. Call your doctor for medical advice about side effects. You may report side effects to FDA at 1-800-FDA-1088. Where should I keep my medicine? This drug is given in a hospital or clinic and will not be stored at home. NOTE: This sheet is a summary. It may not cover all possible information. If you have questions about this medicine, talk to your doctor, pharmacist, or health care provider.  2021 Elsevier/Gold Standard (2011-07-27 17:14:35)

## 2020-12-06 NOTE — Progress Notes (Signed)
Hematology and Oncology Follow Up Visit  Kristen Murray [id number] 02-02-1946 75 y.o. 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  We did have to get a bone marrow biopsy on her.  This was done on 10/18/2020.  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  This was consistent with myelodysplasia.  There were no blasts noted.  We did do cytogenetics.  She had 1 abnormality which was not clear.  She had a 14q rearrangement.  We did do FISH on this which was not found.  On her IPSS-R score, this was only 1.5 which would put this at very low risk.  The problem is that her white cell count continues to drop slowly.  We will go ahead and get her on G-CSF.  I'm not sure which product will be approved.  I'm sure which ever is most cost efficient will be available.  Her iron studies we saw her showed a ferritin of 396 with an iron saturation of 10%.  We'll go ahead and give her dose of IV iron.  She still is thinking about surgery for the right knee.  I really don't see a problem with her having this.   Her appetite is doing okay.  She has had no nausea or vomiting.  There is no fever.    Overall, I would say her performance status is ECOG 1.   Medications:  Current Outpatient Medications:  .  meclizine (ANTIVERT) 25 MG tablet, 1 tablet as needed, Disp: , Rfl:  .  acetaminophen (TYLENOL) 325 MG tablet, Take 325-650 mg by mouth every 6 (six) hours as needed (for pain)., Disp: , Rfl:  .  apixaban (ELIQUIS) 5 MG TABS tablet, Take 1 tablet (5 mg total) by mouth 2 (two) times daily., Disp: 180 tablet, Rfl: 1 .  cyclobenzaprine (FLEXERIL) 10 MG tablet, Take 2.5 mg by mouth daily as needed for muscle spasms.   (Patient not taking: Reported on 09/29/2020), Disp: , Rfl:  .  diclofenac (VOLTAREN) 0.1 % ophthalmic solution, 4 (four) times daily., Disp: , Rfl:  .  fluorometholone (FML) 0.1 % ophthalmic suspension, Place 1 drop into both eyes every Monday, Wednesday, and Friday. , Disp: , Rfl:  .  fluticasone (FLONASE) 50 MCG/ACT nasal spray, Place 1 spray into both nostrils 2 (two) times daily., Disp: 16 g, Rfl: 5 .  glimepiride (AMARYL) 1 MG tablet, Take 1 mg by mouth daily., Disp: , Rfl:  .  glucose blood (FREESTYLE LITE) test strip, CHECK BLOOD SUGAR ONCE DAILY AS DIRECTED.  DX:790.29, Disp: 100 each, Rfl: 12 .  hydrocortisone 1 % ointment, Apply 1 application topically 2 (two) times daily., Disp: 30 g, Rfl: 0 .  Lancets (FREESTYLE) lancets, Check blood sugar once daily as directed DX:790.29 (FREESTYLE FREEDOM LITE), Disp: 100 each, Rfl: 1 .  loratadine (CLARITIN) 10 MG tablet, Take 1 tablet (10 mg total) by mouth daily., Disp: 30 tablet, Rfl: 0 .  metoprolol tartrate (LOPRESSOR) 50 MG tablet, TAKE 1 AND 1/2 TABLETS BY  MOUTH TWICE DAILY, Disp: 270 tablet, Rfl: 3 .  montelukast (SINGULAIR) 10 MG tablet, Take 1 tablet (10 mg total) by mouth at bedtime., Disp: 30 tablet, Rfl: 5 .  Multiple  Vitamin (MULTIVITAMIN) tablet, Take 1 tablet by mouth daily. Shaklee brand, Disp: , Rfl:  .  pantoprazole (PROTONIX) 40 MG tablet, Take 40 mg by mouth 2 (two) times daily., Disp: , Rfl:  .  rosuvastatin (CRESTOR) 5 MG tablet, Take 5 mg by mouth See admin instructions. Take 5 mg by mouth at bedtime on Mondays and Thursdays..Currently on hold, Disp: , Rfl:  .  traMADol (ULTRAM) 50 MG tablet, tramadol 50 mg tablet  Take 1 tablet every 6 hours by oral route as needed., Disp: , Rfl:  .  trolamine salicylate (ASPERCREME) 10 % cream, Apply 1 application topically as needed for muscle pain., Disp: , Rfl:  .  verapamil (CALAN-SR) 180 MG CR tablet, Take 180 mg by mouth at bedtime. , Disp: , Rfl:  No current facility-administered  medications for this visit.  Facility-Administered Medications Ordered in Other Visits:  .  0.9 %  sodium chloride infusion, , Intravenous, Once, [place], Sarah M, NP .  iron sucrose (VENOFER) 200 mg in sodium chloride 0.9 % 100 mL IVPB, 200 mg, Intravenous, Once, [place], Brand Males, NP  Allergies:  Allergies  Allergen Reactions  . Codeine Hives and Other (See Comments)    Because of a history of documented adverse serious drug reaction, Medi Alert bracelet  is recommended  . Macrodantin [Nitrofurantoin Macrocrystal] Other (See Comments)    Numbness, aching all over  . Other Shortness Of Breath, Rash and Other (See Comments)    Pine nuts & walnuts throat congestion. No documented angioedema. Avocado- Rash  . Propoxyphene Hives, Swelling and Rash  . Propoxyphene Hcl Hives, Swelling and Rash  . Latex Rash  . Banana Rash  . 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 Tizanidine Other (See Comments)    Reaction not recalled  . Tramadol Nausea Only    Past Medical History, Surgical history, Social history, and Family History were reviewed and updated.  Review of Systems: Review of Systems  Constitutional: Negative.   HENT:  Negative.   Eyes: Negative.   Respiratory: Negative.   Cardiovascular: Negative.   Gastrointestinal: Negative.   Endocrine: Negative.   Genitourinary: Negative.    Musculoskeletal: Negative.   Skin: Negative.   Neurological: Negative.   Hematological: Negative.   Psychiatric/Behavioral: Negative.     Physical Exam:  weight is 172 lb (78 kg). Her oral temperature is 97.7 F (36.5 C). Her blood pressure is 140/50 (abnormal) and her pulse is 76. Her respiration is 18 and oxygen saturation is 100%.   Wt Readings from Last 3 Encounters:  12/06/20 172 lb (78 kg)  11/18/20 170 lb (77.1 kg)  10/18/20 169 lb (76.7 kg)    Physical Exam Vitals reviewed.  HENT:     Head:  Normocephalic and atraumatic.  Eyes:     Pupils: Pupils are equal, round, and reactive to light.  Cardiovascular:     Rate and Rhythm: Normal rate and regular rhythm.     Heart sounds: Normal heart sounds.  Pulmonary:     Effort: Pulmonary effort is normal.     Breath sounds: Normal breath sounds.  Abdominal:     General: Bowel sounds are normal.     Palpations: Abdomen is soft.  Musculoskeletal:        General: No tenderness or deformity. Normal range of motion.     Cervical back: Normal range of motion.     Comments: There is moderate  swelling of the left calf area.  This is somewhat tender to palpation.  There is no redness.  I cannot palpate any obvious venous cord.  There is no pain behind the left knee.  She has decent pulses in her distal extremities.  Lymphadenopathy:     Cervical: No cervical adenopathy.  Skin:    General: Skin is warm and dry.     Findings: No erythema or rash.  Neurological:     Mental Status: She is alert and oriented to person, place, and time.  Psychiatric:        Behavior: Behavior normal.        Thought Content: Thought content normal.        Judgment: Judgment normal.      Lab Results  Component Value Date   WBC 2.5 (L) 12/06/2020   HGB 11.7 (L) 12/06/2020   HCT 36.0 12/06/2020   MCV 83.3 12/06/2020   PLT 188 12/06/2020     Chemistry      Component Value Date/Time   NA 138 12/06/2020 1249   NA 138 03/10/2020 1138   K 4.5 12/06/2020 1249   CL 102 12/06/2020 1249   CO2 30 12/06/2020 1249   BUN 13 12/06/2020 1249   BUN 10 03/10/2020 1138   CREATININE 0.69 12/06/2020 1249      Component Value Date/Time   CALCIUM 10.1 12/06/2020 1249   ALKPHOS 87 12/06/2020 1249   AST 13 (L) 12/06/2020 1249   ALT 14 12/06/2020 1249   BILITOT 0.5 12/06/2020 1249       Impression and Plan: Ms. Charlot is a 75 year old African-American female.   Looks like she does have a low-grade myelodysplasia.  Again she has a very low IPSS-R score.  We  certainly do not have to intervene with therapy right now.  Again we will try her on Neulasta or one of the bio similars, to see if this might not be able to get her white cell count up a little bit.  Hopefully, the iron will help.  We will see what her erythropoietin level is going to be.  I would like to see her back in about 3 or 4 weeks.    [name], MD 2/7/20221:56 PM

## 2020-12-07 ENCOUNTER — Encounter: Payer: Self-pay | Admitting: *Deleted

## 2020-12-07 ENCOUNTER — Telehealth: Payer: Self-pay | Admitting: *Deleted

## 2020-12-07 LAB — IRON AND TIBC
Iron: 59 ug/dL (ref 41–142)
Saturation Ratios: 25 % (ref 21–57)
TIBC: 239 ug/dL (ref 236–444)
UIBC: 180 ug/dL (ref 120–384)

## 2020-12-07 LAB — ERYTHROPOIETIN: Erythropoietin: 30.9 m[IU]/mL — ABNORMAL HIGH (ref 2.6–18.5)

## 2020-12-07 LAB — FERRITIN: Ferritin: 331 ng/mL — ABNORMAL HIGH (ref 11–307)

## 2020-12-07 NOTE — Telephone Encounter (Signed)
Called patient and gave her upcoming appts.

## 2020-12-09 ENCOUNTER — Encounter: Payer: Self-pay | Admitting: Physical Therapy

## 2020-12-09 ENCOUNTER — Other Ambulatory Visit: Payer: Self-pay

## 2020-12-09 ENCOUNTER — Ambulatory Visit: Payer: Medicare Other | Attending: Internal Medicine | Admitting: Physical Therapy

## 2020-12-09 ENCOUNTER — Ambulatory Visit: Payer: Medicare Other | Admitting: Physical Therapy

## 2020-12-09 DIAGNOSIS — R2681 Unsteadiness on feet: Secondary | ICD-10-CM | POA: Diagnosis present

## 2020-12-09 DIAGNOSIS — R42 Dizziness and giddiness: Secondary | ICD-10-CM | POA: Diagnosis present

## 2020-12-09 DIAGNOSIS — H8111 Benign paroxysmal vertigo, right ear: Secondary | ICD-10-CM | POA: Diagnosis not present

## 2020-12-09 NOTE — Therapy (Signed)
[facility] [address]  [place], [place], [address] Phone: [phone]   Fax:  [phone]  Physical Therapy Evaluation  Patient Details  Name: Kristen Murray MRN: [id number] Date of Birth: [birth year] Referring Provider (PT): [name], MD   Encounter Date: 12/09/2020   PT End of Session - 12/09/20 1705    Visit Number 1    Number of Visits 13    Date for PT Re-Evaluation 01/20/21    Authorization Type UHC Medicare    PT Start Time 1450    PT Stop Time 1534    PT Time Calculation (min) 44 min    Activity Tolerance --   limited by dizziness   Behavior During Therapy [facility] for tasks assessed/performed           Past Medical History:  Diagnosis Date  . Arthritis   . Borderline abnormal TFTs    as a teen  . 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 Diabetes mellitus without complication ([place])   . E. coli UTI 10/05/12   [facility]; resistant only to Septra DS & tetracycline  . Eye infection   . Fibromyalgia   . GERD (gastroesophageal reflux disease)   . Headache(784.0)   . 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 HSV-1 infection   . HTN (hypertension)   . Hyperglycemia   . Hyperlipidemia   . Meningioma ([employer])   . Myelodysplasia, low grade ([place]) 11/18/2020  . Nephrolithiasis    Dr Amalia Hailey, [facility], Hx of [3][  . Polyp of colon   . Radiculopathy     Past Surgical History:  Procedure Laterality Date  . ABDOMINAL HYSTERECTOMY     TAH/BSO --fibroids, no cancer  . BALLOON DILATION N/A 07/20/2014   Procedure: BALLOON DILATION;  Surgeon: Garlan Fair, MD;  Location: [facility];  Service: Endoscopy;  Laterality: N/A;  . BREAST BIOPSY     Right  . CARPAL TUNNEL RELEASE     & trigger thumb RUE; Dr Alphonzo Cruise  . COLONOSCOPY W/ POLYPECTOMY  07/2004   Neg 2011; Dr Earle Gell  . CYSTOSCOPY  11/2009   Neg  . ESOPHAGOGASTRODUODENOSCOPY N/A 07/20/2014   Procedure:  ESOPHAGOGASTRODUODENOSCOPY (EGD);  Surgeon: Garlan Fair, MD;  Location: [facility];  Service: Endoscopy;  Laterality: N/A;  . FLEXIBLE BRONCHOSCOPY W/ UPPER ENDOSCOPY     neg  . KNEE ARTHROSCOPY     Left  . ROTATOR CUFF REPAIR     R shoulder  . TONSILLECTOMY    . TRIGGER FINGER RELEASE Right    Right Thumb    There were no vitals filed for this visit.    Subjective Assessment - 12/09/20 1452    Subjective Patient reports vertigo since last Wednesday. Wednesday in the middle of the night she got up to go to the bathroom and the room was spinning. Episode resolved within a couple minutes. Denies nausea, vomiting, recent cold or infection, head trauma, hearing loss. Does report worsening allergies. Does recently notice nausea with Meclizine. Dizziness worse when laying down, getting out of bed, walking. Better when sitting still. Notes a strain in her neck recently a lot. Has a hx of a similar episode 10 years ago which improved with PT. Walks with a SPC at [facility].    Pertinent History radiculopathy, HLD, hyperglycemia, HTN, HA, GERD, fibromyalgia, DM, R carpal tunnel release, L knee arthroscopy, R RTC repair    Limitations Lifting;Standing;Walking;House hold activities  Diagnostic tests 12/01/20 head CT:  No acute intracranial pathology    Patient Stated Goals decrease dizziness    Currently in Pain? No/denies              [facility] PT Assessment - 12/09/20 1457      Assessment   Medical Diagnosis Acute vertigo    Referring Provider (PT) [name], MD    Onset Date/Surgical Date 12/01/20    Next MD Visit 12/27/20    Prior Therapy yes- for vertigo      Precautions   Precautions None      Balance Screen   Has the patient fallen in the past 6 months No    Has the patient had a decrease in activity level because of a fear of falling?  No    Is the patient reluctant to leave their home because of a fear of falling?  No      Home Ecologist  residence    Living Arrangements Spouse/significant other    Available Help at Discharge Family    Type of [place] to enter    Entrance Stairs-Number of Steps 8-9 + 15    Entrance Stairs-Rails Right    Home Layout Two level    Alternate Level Stairs-Number of Steps 16    Alternate Level Stairs-Rails Right    Superior - single point   stair lift     Prior Function   Level of Independence Independent    Vocation Retired      Associate Professor   Overall Cognitive Status Within Functional Limits for tasks assessed      Sensation   Light Touch Appears Intact      Coordination   Gross Motor Movements are Fluid and Coordinated Yes      ROM / Strength   AROM / PROM / Strength Strength      Strength   Strength Assessment Site Ankle    Right/Left Ankle Right;Left    Right Ankle Dorsiflexion 4+/5    Right Ankle Plantar Flexion 4+/5    Left Ankle Dorsiflexion 4+/5    Left Ankle Plantar Flexion 4+/5                  Vestibular Assessment - 12/09/20 1501      Oculomotor Exam   Oculomotor Alignment Normal    Ocular ROM WNL    Spontaneous Absent    Gaze-induced  Absent    Smooth Pursuits Intact    Saccades Intact    Comment convergence intact      Oculomotor Exam-Fixation Suppressed    Left Head Impulse --   questionable positive HIT without suppression   Right Head Impulse intact   without suppression     Vestibulo-Ocular Reflex   VOR 1 Head Only (x 1 viewing) horizontal and vertical directions slow but asymptomatic    VOR Cancellation Corrective saccades   to L; mild dizziness     Positional Testing   Dix-Hallpike Dix-Hallpike Right    Sidelying Test Sidelying Right      Dix-Hallpike Right   Dix-Hallpike Right Duration 20 sec    Dix-Hallpike Right Symptoms Upbeat, right rotatory nystagmus   pt with severe dizziness and emotional response     Sidelying Right   Sidelying Right Duration 30 sec    Sidelying Right Symptoms Upbeat,  right rotatory nystagmus   pt with severe dizziness and emotional response  Objective measurements completed on examination: See above findings.        Vestibular Treatment/Exercise - 12/09/20 0001      Vestibular Treatment/Exercise   Vestibular Treatment Provided Canalith Repositioning    Canalith Repositioning Epley Manuever Right       EPLEY MANUEVER RIGHT   Number of Reps  1    Overall Response No change    Response Details  patient reported diziness upon position 3 and position 4- patient with severe dizziness and emotional response, nearly laying supine from fear of falling however a fall did not occur with therapist's support                 PT Education - 12/09/20 1705    Education Details prognosis, POC, edu on post-epley precautions    Person(s) Educated Patient    Methods Explanation;Demonstration;Tactile cues;Verbal cues;Handout    Comprehension Verbalized understanding;Returned demonstration            PT Short Term Goals - 12/09/20 1752      PT SHORT TERM GOAL #1   Title Patient to report understanding and compliance with post-Epley precautions.    Time 2    Period Weeks    Status New    Target Date 12/23/20             PT Long Term Goals - 12/09/20 1754      PT LONG TERM GOAL #1   Title Patient to be independent with vestibular HEP as needed for dizziness.    Time 6    Period Weeks    Status New    Target Date 01/20/21      PT LONG TERM GOAL #2   Title Patient to demonstrate negative R [employer].    Time 6    Period Weeks    Status New    Target Date 01/20/21      PT LONG TERM GOAL #3   Title Patient to report no dizziness with bed mobility.    Time 6    Period Weeks    Status New    Target Date 01/20/21      PT LONG TERM GOAL #4   Title Patient to report no dizziness and demonstrate no LOB when ambulating with SPC while performing head turns.    Time 6    Period Weeks    Status New    Target Date  01/20/21                  Plan - 12/09/20 1706    Clinical Impression Statement Patient is a 74y/o F presenting to OPPT with c/o vertigo for the past week. Denies nausea, vomiting, recent cold or infection, head trauma, hearing loss. Episodes last seconds-minutes and are described as "spinning." Worse with laying down, getting out of bed, walking. Oculomotor exam revealed corrective saccades with L VOR cancellation and slow vertical and horizontal VOR. Patient with positive R sidelying and R DH, presenting with upbeating torsional nystagmus and dizziness. Patient was treated with R Epley- responded with considerable dizziness and emotional response upon sitting which resolved within seconds. Patient was educated on post-epley instructions and was able to ambulate safely out of session. Would benefit from skilled PT services 2x/week for 6 weeks to address aforementioned impairments.    Personal Factors and Comorbidities Age;Time since onset of injury/illness/exacerbation;Behavior Pattern;Comorbidity 3+    Comorbidities radiculopathy, HLD, hyperglycemia, HTN, HA, GERD, fibromyalgia, DM, R carpal tunnel release, L knee arthroscopy, R RTC repair  Examination-Activity Limitations Lift;Locomotion Level;Bed Mobility;Bend;Reach Overhead;Transfers;Carry;Sleep;Dressing;Squat;Stairs;Hygiene/Grooming    Examination-Participation Restrictions Church;Cleaning;Meal Prep;Yard Work;Community Activity;Laundry;Shop;Driving    Stability/Clinical Decision Making Stable/Uncomplicated    Clinical Decision Making Low    Rehab Potential Good    PT Frequency 2x / week    PT Duration 6 weeks    PT Treatment/Interventions ADLs/Self Care Home Management;Canalith Repostioning;Cryotherapy;Moist Heat;Therapeutic activities;Functional mobility training;Stair training;Gait training;Therapeutic exercise;Balance training;Neuromuscular re-education;Patient/family education;Vestibular    PT Next Visit Plan reassess R DH     Consulted and Agree with Plan of Care Patient           Patient will benefit from skilled therapeutic intervention in order to improve the following deficits and impairments:  Dizziness,Decreased activity tolerance,Decreased balance,Difficulty walking  Visit Diagnosis: BPPV (benign paroxysmal positional vertigo), right  Dizziness and giddiness  Unsteadiness on feet     Problem List Patient Active Problem List   Diagnosis Date Noted  . 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 Atrial flutter ([place]) 01/29/2020  . A-fib ([place]) 01/27/2020  . Atrial flutter, paroxysmal ([place]) 01/26/2020  . Iron deficiency anemia due to chronic blood loss 07/18/2019  . Pain in the chest   . Chest pain at rest 11/26/2014  . Shingles 05/14/2014  . 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 Vaginal atrophy 10/16/2012  . Menopause 10/16/2012  . Chest pain, atypical 02/19/2012  . 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 COSTOCHONDRITIS 11/16/2010  . DISTURBANCES OF SENSATION OF SMELL AND TASTE 04/05/2010  . NEPHROLITHIASIS, HX OF 02/08/2010  . EDEMA 10/01/2009  . Brachial neuritis or radiculitis NOS 06/14/2009  . EXTERNAL HEMORRHOIDS 05/25/2009  . MUSCLE PAIN 05/17/2009  . Hyperlipidemia 04/23/2009  . ABNORMAL THYROID FUNCTION TESTS 04/23/2009  . MENINGIOMA 12/26/2007  . HEADACHE 12/26/2007  . RADICULOPATHY 04/02/2007  . Essential hypertension 11/21/2006  . GERD 11/21/2006     Janene Harvey, PT, DPT 12/09/20 6:00 PM   [facility] [address]  [place], [place], [address] Phone: [phone]   Fax:  [phone]  Name: Kristen Murray MRN: [id number] Date of Birth: 04-14-1946

## 2020-12-13 ENCOUNTER — Other Ambulatory Visit: Payer: Self-pay

## 2020-12-13 ENCOUNTER — Ambulatory Visit: Payer: Medicare Other | Admitting: Physical Therapy

## 2020-12-13 ENCOUNTER — Encounter: Payer: Self-pay | Admitting: Physical Therapy

## 2020-12-13 DIAGNOSIS — R2681 Unsteadiness on feet: Secondary | ICD-10-CM

## 2020-12-13 DIAGNOSIS — R42 Dizziness and giddiness: Secondary | ICD-10-CM

## 2020-12-13 DIAGNOSIS — H8111 Benign paroxysmal vertigo, right ear: Secondary | ICD-10-CM | POA: Diagnosis not present

## 2020-12-13 LAB — MYELODYSPLASTIC SYNDROME (MDS) PANEL

## 2020-12-13 NOTE — Therapy (Signed)
[facility] [address]  [place], [place], [address] Phone: [phone]   Fax:  [phone]  Physical Therapy Treatment  Patient Details  Name: Kristen Murray MRN: [id number] Date of Birth: [birth year] Referring Provider (PT): [name], MD   Encounter Date: 12/13/2020   PT End of Session - 12/13/20 1528    Visit Number 2    Number of Visits 13    Date for PT Re-Evaluation 01/20/21    Authorization Type UHC Medicare    PT Start Time 1324    PT Stop Time 1526    PT Time Calculation (min) 41 min    Activity Tolerance Patient tolerated treatment well   limited by dizziness   Behavior During Therapy [facility] for tasks assessed/performed           Past Medical History:  Diagnosis Date  . Arthritis   . Borderline abnormal TFTs    as a teen  . 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 Diabetes mellitus without complication ([place])   . E. coli UTI 10/05/12   [facility]; resistant only to Septra DS & tetracycline  . Eye infection   . Fibromyalgia   . GERD (gastroesophageal reflux disease)   . Headache(784.0)   . 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 HSV-1 infection   . HTN (hypertension)   . Hyperglycemia   . Hyperlipidemia   . Meningioma ([place])   . 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 Nephrolithiasis    Dr Amalia Hailey, [facility], Hx of [3][  . Polyp of colon   . Radiculopathy     Past Surgical History:  Procedure Laterality Date  . ABDOMINAL HYSTERECTOMY     TAH/BSO --fibroids, no cancer  . BALLOON DILATION N/A 07/20/2014   Procedure: BALLOON DILATION;  Surgeon: Garlan Fair, MD;  Location: [facility];  Service: Endoscopy;  Laterality: N/A;  . BREAST BIOPSY     Right  . CARPAL TUNNEL RELEASE     & trigger thumb RUE; Dr Alphonzo Cruise  . COLONOSCOPY W/ POLYPECTOMY  07/2004   Neg 2011; Dr Earle Gell  . CYSTOSCOPY  11/2009   Neg  . ESOPHAGOGASTRODUODENOSCOPY N/A 07/20/2014    Procedure: ESOPHAGOGASTRODUODENOSCOPY (EGD);  Surgeon: Garlan Fair, MD;  Location: [facility];  Service: Endoscopy;  Laterality: N/A;  . FLEXIBLE BRONCHOSCOPY W/ UPPER ENDOSCOPY     neg  . KNEE ARTHROSCOPY     Left  . ROTATOR CUFF REPAIR     R shoulder  . TONSILLECTOMY    . TRIGGER FINGER RELEASE Right    Right Thumb    There were no vitals filed for this visit.   Subjective Assessment - 12/13/20 1445    Subjective Reports that she woke up pretty dizzine on Saturday AM and felt like the room was spinning. Reports that she was able to follow post-Epley instructions pretty well. Cannot recall how she felt Friday. Current dizziness 4-5/10.    Pertinent History radiculopathy, HLD, hyperglycemia, HTN, HA, GERD, fibromyalgia, DM, R carpal tunnel release, L knee arthroscopy, R RTC repair    Diagnostic tests 12/01/20 head CT:  No acute intracranial pathology    Patient Stated Goals decrease dizziness    Currently in Pain? No/denies                   Vestibular Assessment - 12/13/20 0001      Positional Testing   Dix-Hallpike Dix-Hallpike Left  Horizontal Canal Testing Horizontal Canal Right;Horizontal Canal Left      Dix-Hallpike Right   Dix-Hallpike Right Duration 0    Dix-Hallpike Right Symptoms No nystagmus   no dizziness; did have dizziness and indiscernable nystagmus upon sitting     Dix-Hallpike Left   Dix-Hallpike Left Duration 0    Dix-Hallpike Left Symptoms No nystagmus      Horizontal Canal Right   Horizontal Canal Right Duration 15 sec    Horizontal Canal Right Symptoms Nystagmus   2x appeared to be upbeating torstional nyastagmus; c/o dizziness     Horizontal Canal Left   Horizontal Canal Left Duration 0    Horizontal Canal Left Symptoms Normal                     Vestibular Treatment/Exercise - 12/13/20 0001      Vestibular Treatment/Exercise   Canalith Repositioning Semont Procedure Right Posterior      Semont Procedure Right  Posterior   Number of Reps  1    Overall Response  Improved Symptoms    Response Details  no c/o dizziness throughout                 PT Education - 12/13/20 1527    Education Details edu on risk factors of BPPV as well as rationale for today's treatment; edu on HEP    Person(s) Educated Patient    Methods Explanation;Demonstration;Tactile cues;Verbal cues;Handout    Comprehension Verbalized understanding            PT Short Term Goals - 12/13/20 1532      PT SHORT TERM GOAL #1   Title Patient to report understanding and compliance with post-Epley precautions.    Time 2    Period Weeks    Status Achieved             PT Long Term Goals - 12/13/20 1532      PT LONG TERM GOAL #1   Title Patient to be independent with vestibular HEP as needed for dizziness.    Time 6    Period Weeks    Status On-going      PT LONG TERM GOAL #2   Title Patient to demonstrate negative R [employer].    Time 6    Period Weeks    Status On-going      PT LONG TERM GOAL #3   Title Patient to report no dizziness with bed mobility.    Time 6    Period Weeks    Status On-going      PT LONG TERM GOAL #4   Title Patient to report no dizziness and demonstrate no LOB when ambulating with SPC while performing head turns.    Time 6    Period Weeks    Status On-going                 Plan - 12/13/20 1531    Clinical Impression Statement Patient arrived with report of dizziness on Saturday morning with sensation of spinning. Re-assessed R DH x2 which was negative for dizziness and nystagmus. L DH negative. Tested horizontal canals, with visible but indiscernible nystagmus and c/o dizziness with R roll test. D/t uncertainty between R posterior vs. R horizontal canalithiasis, treated patient with R Semont maneuver. Patient tolerated this well and without complaints at end of session. Patient was educated on R Nestor Lewandowsky to be performed with husband's help. Patient reported  understanding.    Comorbidities radiculopathy, HLD,  hyperglycemia, HTN, HA, GERD, fibromyalgia, DM, R carpal tunnel release, L knee arthroscopy, R RTC repair    PT Treatment/Interventions ADLs/Self Care Home Management;Canalith Repostioning;Cryotherapy;Moist Heat;Therapeutic activities;Functional mobility training;Stair training;Gait training;Therapeutic exercise;Balance training;Neuromuscular re-education;Patient/family education;Vestibular    PT Next Visit Plan reassess R DH, R roll test    Consulted and Agree with Plan of Care Patient           Patient will benefit from skilled therapeutic intervention in order to improve the following deficits and impairments:     Visit Diagnosis: BPPV (benign paroxysmal positional vertigo), right  Dizziness and giddiness  Unsteadiness on feet     Problem List Patient Active Problem List   Diagnosis Date Noted  . 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 Atrial flutter ([place]) 01/29/2020  . A-fib ([place]) 01/27/2020  . Atrial flutter, paroxysmal ([place]) 01/26/2020  . Iron deficiency anemia due to chronic blood loss 07/18/2019  . Pain in the chest   . Chest pain at rest 11/26/2014  . Shingles 05/14/2014  . 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 Vaginal atrophy 10/16/2012  . Menopause 10/16/2012  . Chest pain, atypical 02/19/2012  . 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 COSTOCHONDRITIS 11/16/2010  . DISTURBANCES OF SENSATION OF SMELL AND TASTE 04/05/2010  . NEPHROLITHIASIS, HX OF 02/08/2010  . EDEMA 10/01/2009  . Brachial neuritis or radiculitis NOS 06/14/2009  . EXTERNAL HEMORRHOIDS 05/25/2009  . MUSCLE PAIN 05/17/2009  . Hyperlipidemia 04/23/2009  . ABNORMAL THYROID FUNCTION TESTS 04/23/2009  . MENINGIOMA 12/26/2007  . HEADACHE 12/26/2007  . RADICULOPATHY 04/02/2007  . Essential hypertension 11/21/2006  . GERD 11/21/2006     Janene Harvey, PT,  DPT 12/13/20 3:33 PM   [facility] [address]  [place], [place], [address] Phone: [phone]   Fax:  [phone]  Name: DANNIELLA ROBBEN MRN: [id number] Date of Birth: July 13, 1946

## 2020-12-16 ENCOUNTER — Encounter: Payer: Medicare Other | Admitting: Obstetrics and Gynecology

## 2020-12-17 ENCOUNTER — Other Ambulatory Visit: Payer: Self-pay

## 2020-12-17 ENCOUNTER — Encounter: Payer: Self-pay | Admitting: Physical Therapy

## 2020-12-17 ENCOUNTER — Ambulatory Visit: Payer: Medicare Other | Admitting: Physical Therapy

## 2020-12-17 DIAGNOSIS — R2681 Unsteadiness on feet: Secondary | ICD-10-CM

## 2020-12-17 DIAGNOSIS — H8111 Benign paroxysmal vertigo, right ear: Secondary | ICD-10-CM | POA: Diagnosis not present

## 2020-12-17 DIAGNOSIS — R42 Dizziness and giddiness: Secondary | ICD-10-CM

## 2020-12-17 NOTE — Therapy (Signed)
[facility] [address]  [place], [place], [address] Phone: [phone]   Fax:  [phone]  Physical Therapy Treatment  Patient Details  Name: Kristen Murray MRN: [id number] Date of Birth: 23-Nov-1945 Referring Provider (PT): [name], MD   Encounter Date: 12/17/2020   PT End of Session - 12/17/20 1015    Visit Number 3    Number of Visits 13    Date for PT Re-Evaluation 01/20/21    Authorization Type UHC Medicare    PT Start Time 0927    PT Stop Time 1012    PT Time Calculation (min) 45 min    Activity Tolerance Patient tolerated treatment well   limited by dizziness   Behavior During Therapy [facility] for tasks assessed/performed           Past Medical History:  Diagnosis Date  . Arthritis   . Borderline abnormal TFTs    as a teen  . 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 Diabetes mellitus without complication ([place])   . E. coli UTI 10/05/12   [facility]; resistant only to Septra DS & tetracycline  . Eye infection   . Fibromyalgia   . GERD (gastroesophageal reflux disease)   . Headache(784.0)   . 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 HSV-1 infection   . HTN (hypertension)   . Hyperglycemia   . Hyperlipidemia   . Meningioma ([place])   . Myelodysplasia, low grade ([place]) 11/18/2020  . Nephrolithiasis    Dr Amalia Hailey, [facility], Hx of [3][  . Polyp of colon   . Radiculopathy     Past Surgical History:  Procedure Laterality Date  . ABDOMINAL HYSTERECTOMY     TAH/BSO --fibroids, no cancer  . BALLOON DILATION N/A 07/20/2014   Procedure: BALLOON DILATION;  Surgeon: Garlan Fair, MD;  Location: [facility];  Service: Endoscopy;  Laterality: N/A;  . BREAST BIOPSY     Right  . CARPAL TUNNEL RELEASE     & trigger thumb RUE; Dr Alphonzo Cruise  . COLONOSCOPY W/ POLYPECTOMY  07/2004   Neg 2011; Dr Earle Gell  . CYSTOSCOPY  11/2009   Neg  . ESOPHAGOGASTRODUODENOSCOPY N/A 07/20/2014    Procedure: ESOPHAGOGASTRODUODENOSCOPY (EGD);  Surgeon: Garlan Fair, MD;  Location: [facility];  Service: Endoscopy;  Laterality: N/A;  . FLEXIBLE BRONCHOSCOPY W/ UPPER ENDOSCOPY     neg  . KNEE ARTHROSCOPY     Left  . ROTATOR CUFF REPAIR     R shoulder  . TONSILLECTOMY    . TRIGGER FINGER RELEASE Right    Right Thumb    There were no vitals filed for this visit.   Subjective Assessment - 12/17/20 0927    Subjective Reports that she was doing her exercise with her husband- he jerked her up and she became very dizzy and reports sensation of an earache on her L. Still feels dizzy and like she is being pulled to her R. Feeling a little better today compared to yesterday. Notes several years of allergies and congesion in her nose- feels like it is a little worse recently.    Pertinent History radiculopathy, HLD, hyperglycemia, HTN, HA, GERD, fibromyalgia, DM, R carpal tunnel release, L knee arthroscopy, R RTC repair    Diagnostic tests 12/01/20 head CT:  No acute intracranial pathology    Patient Stated Goals decrease dizziness    Currently in Pain? No/denies  Vestibular Assessment - 12/17/20 0001      Positional Testing   Sidelying Test Sidelying Left      Dix-Hallpike Right   Dix-Hallpike Right Duration 0    Dix-Hallpike Right Symptoms No nystagmus      Sidelying Right   Sidelying Right Duration 0    Sidelying Right Symptoms No nystagmus   dizziness upon sitting- no nystagmus evident     Sidelying Left   Sidelying Left Duration 0    Sidelying Left Symptoms No nystagmus      Horizontal Canal Right   Horizontal Canal Right Duration 45 sec    Horizontal Canal Right Symptoms Ageotrophic   ~15 sec latency; 2nd trial negative for nystagmus     Horizontal Canal Left   Horizontal Canal Left Duration 0    Horizontal Canal Left Symptoms Normal                     Vestibular Treatment/Exercise - 12/17/20 0001      Vestibular  Treatment/Exercise   Vestibular Treatment Provided Habituation;Canalith Repositioning    Canalith Repositioning Appiani Left    Habituation Exercises Comment   R/L anterior habituation in sitting 1x each side; no dizziness   Gaze Exercises X1 Viewing Horizontal;X1 Viewing Vertical      Appiani Left   Number of Reps  1    Overall Response  No change    Response Details Gufoni for apogeotrophic nystagmus- L horozontal cupololithiasis;  resport of sensation of rocking backward upon sitting which resolved after 10 sec      X1 Viewing Horizontal   Reps --   2x10   Comments cues to increase speed   more difficulty wiht gaze stability to L; no dizziness     X1 Viewing Vertical   Reps --   2x10   Comments cues to increase speed   no dizziness                PT Education - 12/17/20 1017    Education Details advised patient to f/u with MD about possible L ear infection; edu on common symptoms of vestibular hypofunction            PT Short Term Goals - 12/13/20 1532      PT SHORT TERM GOAL #1   Title Patient to report understanding and compliance with post-Epley precautions.    Time 2    Period Weeks    Status Achieved             PT Long Term Goals - 12/13/20 1532      PT LONG TERM GOAL #1   Title Patient to be independent with vestibular HEP as needed for dizziness.    Time 6    Period Weeks    Status On-going      PT LONG TERM GOAL #2   Title Patient to demonstrate negative R [employer].    Time 6    Period Weeks    Status On-going      PT LONG TERM GOAL #3   Title Patient to report no dizziness with bed mobility.    Time 6    Period Weeks    Status On-going      PT LONG TERM GOAL #4   Title Patient to report no dizziness and demonstrate no LOB when ambulating with SPC while performing head turns.    Time 6    Period Weeks    Status On-going  Plan - 12/17/20 1015    Clinical Impression Statement Reassessed R SL test, which  patient reported dizziness with upon sitting. R DH negative. L/R roll test was performed with latent c/o dizziness with R head turn and very slightly apogeotophic nystagmus. VOR training was performed with patient tolerating this well. Performed Gufoni for apogeotrophic nystagmus- patient tolerated this well, and subsequent R roll test was negative for dizziness and nystagmus. Patient at this time seems to be demonstrating more generalized motion sensitivity rather than BPPV-type symptoms. Encourage patient to continue performing [place] with husband's help for habitation and to speak with her MD about possible L ear infection. Patient reported understanding and without complaints at end of session.    Comorbidities radiculopathy, HLD, hyperglycemia, HTN, HA, GERD, fibromyalgia, DM, R carpal tunnel release, L knee arthroscopy, R RTC repair    PT Treatment/Interventions ADLs/Self Care Home Management;Canalith Repostioning;Cryotherapy;Moist Heat;Therapeutic activities;Functional mobility training;Stair training;Gait training;Therapeutic exercise;Balance training;Neuromuscular re-education;Patient/family education;Vestibular    PT Next Visit Plan reassess R roll test; continue habiutation    Consulted and Agree with Plan of Care Patient           Patient will benefit from skilled therapeutic intervention in order to improve the following deficits and impairments:     Visit Diagnosis: BPPV (benign paroxysmal positional vertigo), right  Dizziness and giddiness  Unsteadiness on feet     Problem List Patient Active Problem List   Diagnosis Date Noted  . 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 Atrial flutter ([place]) 01/29/2020  . A-fib ([place]) 01/27/2020  . Atrial flutter, paroxysmal ([place]) 01/26/2020  . Iron deficiency anemia due to chronic blood loss 07/18/2019  . Pain in the chest   . Chest pain at rest 11/26/2014  . Shingles 05/14/2014  .  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 Vaginal atrophy 10/16/2012  . Menopause 10/16/2012  . Chest pain, atypical 02/19/2012  . 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 COSTOCHONDRITIS 11/16/2010  . DISTURBANCES OF SENSATION OF SMELL AND TASTE 04/05/2010  . NEPHROLITHIASIS, HX OF 02/08/2010  . EDEMA 10/01/2009  . Brachial neuritis or radiculitis NOS 06/14/2009  . EXTERNAL HEMORRHOIDS 05/25/2009  . MUSCLE PAIN 05/17/2009  . Hyperlipidemia 04/23/2009  . ABNORMAL THYROID FUNCTION TESTS 04/23/2009  . MENINGIOMA 12/26/2007  . HEADACHE 12/26/2007  . RADICULOPATHY 04/02/2007  . Essential hypertension 11/21/2006  . GERD 11/21/2006    Manuela Neptune 12/17/2020, 10:17 AM  [facility] [address]  [place], [place], [address] Phone: [phone]   Fax:  [phone]  Name: Kristen Murray MRN: [id number] Date of Birth: May 26, 1946

## 2020-12-21 ENCOUNTER — Ambulatory Visit (INDEPENDENT_AMBULATORY_CARE_PROVIDER_SITE_OTHER): Payer: Medicare Other | Admitting: Obstetrics and Gynecology

## 2020-12-21 ENCOUNTER — Other Ambulatory Visit: Payer: Self-pay

## 2020-12-21 ENCOUNTER — Encounter: Payer: Self-pay | Admitting: Obstetrics and Gynecology

## 2020-12-21 VITALS — BP 132/60 | Ht 65.0 in | Wt 169.0 lb

## 2020-12-21 DIAGNOSIS — Z13820 Encounter for screening for osteoporosis: Secondary | ICD-10-CM

## 2020-12-21 DIAGNOSIS — A6000 Herpesviral infection of urogenital system, unspecified: Secondary | ICD-10-CM | POA: Diagnosis not present

## 2020-12-21 DIAGNOSIS — Z01419 Encounter for gynecological examination (general) (routine) without abnormal findings: Secondary | ICD-10-CM | POA: Diagnosis not present

## 2020-12-21 DIAGNOSIS — N6310 Unspecified lump in the right breast, unspecified quadrant: Secondary | ICD-10-CM | POA: Diagnosis not present

## 2020-12-21 DIAGNOSIS — R350 Frequency of micturition: Secondary | ICD-10-CM

## 2020-12-21 NOTE — Progress Notes (Signed)
HEAVIN Murray [birth year] [id number]  SUBJECTIVE:  75 y.o. G3P3003 female for annual routine gynecologic exam and Pap smear.  Intermittent urinary frequency, no pain with urination. She has no gynecologic concerns.      Current Outpatient Medications  Medication Sig Dispense Refill  . acetaminophen (TYLENOL) 325 MG tablet Take 325-650 mg by mouth every 6 (six) hours as needed (for pain).    Marland Kitchen apixaban (ELIQUIS) 5 MG TABS tablet Take 1 tablet (5 mg total) by mouth 2 (two) times daily. 180 tablet 1  . cyclobenzaprine (FLEXERIL) 10 MG tablet Take 2.5 mg by mouth daily as needed for muscle spasms.    . diclofenac (VOLTAREN) 0.1 % ophthalmic solution 4 (four) times daily.    . fluorometholone (FML) 0.1 % ophthalmic suspension Place 1 drop into both eyes every Monday, Wednesday, and Friday.     . fluticasone (FLONASE) 50 MCG/ACT nasal spray Place 1 spray into both nostrils 2 (two) times daily. 16 g 5  . glimepiride (AMARYL) 1 MG tablet Take 1 mg by mouth daily.    Marland Kitchen glucose blood (FREESTYLE LITE) test strip CHECK BLOOD SUGAR ONCE DAILY AS DIRECTED.  DX:790.29 100 each 12  . hydrocortisone 1 % ointment Apply 1 application topically 2 (two) times daily. 30 g 0  . Lancets (FREESTYLE) lancets Check blood sugar once daily as directed DX:790.29 (FREESTYLE FREEDOM LITE) 100 each 1  . loratadine (CLARITIN) 10 MG tablet Take 1 tablet (10 mg total) by mouth daily. 30 tablet 0  . meclizine (ANTIVERT) 25 MG tablet 1 tablet as needed    . metoprolol tartrate (LOPRESSOR) 50 MG tablet TAKE 1 AND 1/2 TABLETS BY  MOUTH TWICE DAILY 270 tablet 3  . montelukast (SINGULAIR) 10 MG tablet Take 1 tablet (10 mg total) by mouth at bedtime. 30 tablet 5  . Multiple Vitamin (MULTIVITAMIN) tablet Take 1 tablet by mouth daily. Shaklee brand    . pantoprazole (PROTONIX) 40 MG tablet Take 40 mg by mouth 2 (two) times daily.    . rosuvastatin (CRESTOR) 5 MG tablet Take 5 mg by mouth See admin instructions. Take 5 mg by mouth at  bedtime on Mondays and Thursdays..Currently on hold    . traMADol (ULTRAM) 50 MG tablet tramadol 50 mg tablet  Take 1 tablet every 6 hours by oral route as needed.    . trolamine salicylate (ASPERCREME) 10 % cream Apply 1 application topically as needed for muscle pain.    . verapamil (CALAN-SR) 180 MG CR tablet Take 180 mg by mouth at bedtime.      No current facility-administered medications for this visit.   Allergies: Codeine, Macrodantin [nitrofurantoin macrocrystal], Other, Propoxyphene, Propoxyphene hcl, Latex, Banana, Ciprofloxacin, Tizanidine, and Tramadol  Patient's last menstrual period was 07/30/1990.  Past medical history,surgical history, problem list, medications, allergies, family history and social history were all reviewed and documented as reviewed in the EPIC chart.  ROS: Pertinent positives and negatives as reviewed in HPI  OBJECTIVE:  BP 132/60 (BP Location: Right Arm, Patient Position: Sitting, Cuff Size: Normal)   Ht 5\' 5"  (1.651 m)   Wt 169 lb (76.7 kg)   LMP 07/30/1990   BMI 28.12 kg/m  The patient appears well, alert, oriented, in no distress.  BREAST EXAM: breasts appear normal, right breast with 2 cm lump that is mobile and about 4 cm off of the areola at 2:00, left with no suspicious masses, no skin or nipple changes or axillary nodes  PELVIC EXAM: VULVA: normal appearing  vulva with no masses, tenderness or lesions, atrophic changes noted, VAGINA: normal appearing vagina with atrophic change, normal color and discharge, no lesions, CERVIX: surgically absent, UTERUS: surgically absent, vaginal cuff well healed, ADNEXA: no masses, RECTAL: normal rectal, no masses Kristen Manchester RN present during the examination  ASSESSMENT:  75 y.o. [id number] here for annual gynecologic exam  PLAN:   1. Postmenopausal.  Prior TAH/BSO for leiomyoma.  No significant menopausal symptoms. 2. Pap smear 2021.  No history of abnormal Pap smears.   3. Mammogram 12/2019.  Right breast  lump noted on exam today.  She says she has had a biopsy in this breast before.  We will have staff arrange for diagnostic right mammogram to go along with her screening mammogram.   4. Colonoscopy 2011.  Defer to her primary for further recommendations regarding screening. 5. DEXA 2017 was normal.  Recommended repeat at 5 year interval, which would be this year.  Test is ordered. 6. History of genital HSV.  Taking Valtrex prophylaxis and says she does not need a refill at this time.  Will contact us if that changes. 7.  Urinary frequency.  No symptoms today but has recently had this issue.  Previously was on Myrbetriq in the past.  Will check urine culture as she had left a limited urine specimen today. 8. Health maintenance.  No lab work as she has this completed elsewhere.  The patient is aware that I will only be at this practice until early March 2022 so she knows to make sure she requests follow-up on results for any medical tests that she does when I am no longer at the practice.   Return annually or sooner, prn.  Joseph Pierini MD  12/21/20

## 2020-12-22 ENCOUNTER — Telehealth: Payer: Self-pay | Admitting: *Deleted

## 2020-12-22 LAB — URINE CULTURE
MICRO NUMBER:: 11563985
SPECIMEN QUALITY:: ADEQUATE

## 2020-12-22 NOTE — Telephone Encounter (Addendum)
Patient scheduled at [facility] on 02/09/21 @ 8:00am order faxed to [phone]. Left message for patient to call with time and date.

## 2020-12-22 NOTE — Telephone Encounter (Signed)
-----   Message from Joseph Pierini, MD sent at 12/21/2020  2:13 PM EST ----- Regarding: Diagnostic mammogram for right breast This patient will need a right breast diagnostic mammogram to go along with her screening mammogram, thank you

## 2020-12-23 ENCOUNTER — Encounter: Payer: Self-pay | Admitting: Obstetrics and Gynecology

## 2020-12-23 NOTE — Telephone Encounter (Signed)
I spoke with patient and informed her of date/time for appt at [facility].

## 2020-12-24 ENCOUNTER — Other Ambulatory Visit: Payer: Self-pay

## 2020-12-24 ENCOUNTER — Encounter: Payer: Self-pay | Admitting: Physical Therapy

## 2020-12-24 ENCOUNTER — Ambulatory Visit: Payer: Medicare Other | Admitting: Physical Therapy

## 2020-12-24 DIAGNOSIS — H8111 Benign paroxysmal vertigo, right ear: Secondary | ICD-10-CM | POA: Diagnosis not present

## 2020-12-24 DIAGNOSIS — R2681 Unsteadiness on feet: Secondary | ICD-10-CM

## 2020-12-24 DIAGNOSIS — R42 Dizziness and giddiness: Secondary | ICD-10-CM

## 2020-12-24 NOTE — Therapy (Signed)
[facility] [address]  [place], [place], [address] Phone: [phone]   Fax:  [phone]  Physical Therapy Treatment  Patient Details  Name: Kristen Murray MRN: [id number] Date of Birth: 1946-04-26 Referring Provider (PT): [name], MD   Encounter Date: 12/24/2020   PT End of Session - 12/24/20 0927    Visit Number 4    Number of Visits 13    Date for PT Re-Evaluation 01/20/21    Authorization Type UHC Medicare    PT Start Time 0845    PT Stop Time 0922    PT Time Calculation (min) 37 min    Activity Tolerance Patient tolerated treatment well   limited by dizziness   Behavior During Therapy [facility] for tasks assessed/performed           Past Medical History:  Diagnosis Date  . Arthritis   . Borderline abnormal TFTs    as a teen  . 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 Diabetes mellitus without complication ([place])   . E. coli UTI 10/05/12   [facility]; resistant only to Septra DS & tetracycline  . Eye infection   . Fibromyalgia   . GERD (gastroesophageal reflux disease)   . Headache(784.0)   . 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 HSV-1 infection   . HTN (hypertension)   . Hyperglycemia   . Hyperlipidemia   . Meningioma ([place])   . Myelodysplasia, low grade ([place]) 11/18/2020  . Nephrolithiasis    Dr Amalia Hailey, [facility], Hx of [3][  . Polyp of colon   . Radiculopathy     Past Surgical History:  Procedure Laterality Date  . ABDOMINAL HYSTERECTOMY     TAH/BSO --fibroids, no cancer  . BALLOON DILATION N/A 07/20/2014   Procedure: BALLOON DILATION;  Surgeon: Garlan Fair, MD;  Location: [facility];  Service: Endoscopy;  Laterality: N/A;  . BREAST BIOPSY     Right  . CARPAL TUNNEL RELEASE     & trigger thumb RUE; Dr Alphonzo Cruise  . COLONOSCOPY W/ POLYPECTOMY  07/2004   Neg 2011; Dr Earle Gell  . CYSTOSCOPY  11/2009   Neg  . ESOPHAGOGASTRODUODENOSCOPY N/A 07/20/2014    Procedure: ESOPHAGOGASTRODUODENOSCOPY (EGD);  Surgeon: Garlan Fair, MD;  Location: [facility];  Service: Endoscopy;  Laterality: N/A;  . FLEXIBLE BRONCHOSCOPY W/ UPPER ENDOSCOPY     neg  . KNEE ARTHROSCOPY     Left  . ROTATOR CUFF REPAIR     R shoulder  . TONSILLECTOMY    . TRIGGER FINGER RELEASE Right    Right Thumb    There were no vitals filed for this visit.   Subjective Assessment - 12/24/20 0847    Subjective Feels a little dizzy. Feels that she is being pulled to the L and being pulled forward whean walking. No longer experiencing spinning. Will be seeing her PCP soon. Reports 80% improvement in dizziness since initial eval. Still experiences dizziness when getting up from supine.    Pertinent History radiculopathy, HLD, hyperglycemia, HTN, HA, GERD, fibromyalgia, DM, R carpal tunnel release, L knee arthroscopy, R RTC repair    Diagnostic tests 12/01/20 head CT:  No acute intracranial pathology    Patient Stated Goals decrease dizziness    Currently in Pain? No/denies                   Vestibular Assessment - 12/24/20 0001      Dix-Hallpike  Right   Dix-Hallpike Right Duration 0    Dix-Hallpike Right Symptoms No nystagmus      Horizontal Canal Right   Horizontal Canal Right Duration 45 sec    Horizontal Canal Right Symptoms Ageotrophic;Geotrophic   1st trial- dizziness; 2nd trial- demonstrated geotrophic nystagmus but no dizziness     Horizontal Canal Left   Horizontal Canal Left Duration 0    Horizontal Canal Left Symptoms Normal                     Vestibular Treatment/Exercise - 12/24/20 0001      Vestibular Treatment/Exercise   Habituation Exercises Laruth Bouchard Daroff      Appiani Left   Number of Reps  2    Overall Response  Improved Symptoms    Response Details 1st rep- Gufoni for apogeotrophic nystagmus- L horozontal cupololithiasis;  report of sensation of moving backward upon sitting which resolved after ~1 minute; 2nd rep- Gufoni  for R horizontal canalithiasis      Nestor Lewandowsky   Number of Reps  2    Symptom Description  R;- c/o dizziness upon laying down and sitting up 9/10                   PT Short Term Goals - 12/13/20 1532      PT SHORT TERM GOAL #1   Title Patient to report understanding and compliance with post-Epley precautions.    Time 2    Period Weeks    Status Achieved             PT Long Term Goals - 12/13/20 1532      PT LONG TERM GOAL #1   Title Patient to be independent with vestibular HEP as needed for dizziness.    Time 6    Period Weeks    Status On-going      PT LONG TERM GOAL #2   Title Patient to demonstrate negative R [employer].    Time 6    Period Weeks    Status On-going      PT LONG TERM GOAL #3   Title Patient to report no dizziness with bed mobility.    Time 6    Period Weeks    Status On-going      PT LONG TERM GOAL #4   Title Patient to report no dizziness and demonstrate no LOB when ambulating with SPC while performing head turns.    Time 6    Period Weeks    Status On-going                 Plan - 12/24/20 6948    Clinical Impression Statement Patient arrived to session with report of 80% improvement in dizziness overall however still with c/o dizziness with supine>sit and sensation of L and anterior pulsion with walking. Re-assessed R Nestor Lewandowsky, with which patient reported dizziness in dependent positioning and supine>sit. Upon observation, slight indiscernible nystagmus was present. Re-tested R DH which was negative, however patient with positive R roll test, demonstrating apogeotropic nystagmus and dizziness. Patient treated with Gufoni for apogeotropic nystagmus. R roll test then demonstrated R geotropic nystagmus. Patient was then treated with Gufoni for R horizontal canalithiasis. Patient reported very mild dizziness at end of session, otherwise without complaints. Plan for continued reassessment of dizziness next session d/t  changing complaints.    Comorbidities radiculopathy, HLD, hyperglycemia, HTN, HA, GERD, fibromyalgia, DM, R carpal tunnel release, L knee arthroscopy, R RTC repair  PT Treatment/Interventions ADLs/Self Care Home Management;Canalith Repostioning;Cryotherapy;Moist Heat;Therapeutic activities;Functional mobility training;Stair training;Gait training;Therapeutic exercise;Balance training;Neuromuscular re-education;Patient/family education;Vestibular    PT Next Visit Plan reassess R roll test; continue habiutation    Consulted and Agree with Plan of Care Patient           Patient will benefit from skilled therapeutic intervention in order to improve the following deficits and impairments:     Visit Diagnosis: BPPV (benign paroxysmal positional vertigo), right  Dizziness and giddiness  Unsteadiness on feet     Problem List Patient Active Problem List   Diagnosis Date Noted  . 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 Atrial flutter ([place]) 01/29/2020  . A-fib ([place]) 01/27/2020  . Atrial flutter, paroxysmal ([place]) 01/26/2020  . Iron deficiency anemia due to chronic blood loss 07/18/2019  . Pain in the chest   . Chest pain at rest 11/26/2014  . Shingles 05/14/2014  . 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 Vaginal atrophy 10/16/2012  . Menopause 10/16/2012  . Chest pain, atypical 02/19/2012  . 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 COSTOCHONDRITIS 11/16/2010  . DISTURBANCES OF SENSATION OF SMELL AND TASTE 04/05/2010  . NEPHROLITHIASIS, HX OF 02/08/2010  . EDEMA 10/01/2009  . Brachial neuritis or radiculitis NOS 06/14/2009  . EXTERNAL HEMORRHOIDS 05/25/2009  . MUSCLE PAIN 05/17/2009  . Hyperlipidemia 04/23/2009  . ABNORMAL THYROID FUNCTION TESTS 04/23/2009  . MENINGIOMA 12/26/2007  . HEADACHE 12/26/2007  . RADICULOPATHY 04/02/2007  . Essential hypertension 11/21/2006  . GERD 11/21/2006     Janene Harvey, PT, DPT 12/24/20 9:30 AM   [facility] [address]  [place], [place], [address] Phone: [phone]   Fax:  [phone]  Name: Kristen Murray MRN: [id number] Date of Birth: 1946/03/05

## 2020-12-29 ENCOUNTER — Inpatient Hospital Stay: Payer: Medicare Other

## 2020-12-29 ENCOUNTER — Inpatient Hospital Stay: Payer: Medicare Other | Attending: Hematology & Oncology

## 2020-12-29 ENCOUNTER — Telehealth: Payer: Self-pay | Admitting: *Deleted

## 2020-12-29 ENCOUNTER — Other Ambulatory Visit: Payer: Self-pay

## 2020-12-29 ENCOUNTER — Inpatient Hospital Stay: Payer: Medicare Other | Admitting: Hematology & Oncology

## 2020-12-29 ENCOUNTER — Encounter: Payer: Self-pay | Admitting: Hematology & Oncology

## 2020-12-29 VITALS — BP 105/74 | HR 75 | Temp 98.8°F | Resp 18 | Ht 64.0 in | Wt 171.4 lb

## 2020-12-29 DIAGNOSIS — Z79899 Other long term (current) drug therapy: Secondary | ICD-10-CM | POA: Insufficient documentation

## 2020-12-29 DIAGNOSIS — D4620 Refractory anemia with excess of blasts, unspecified: Secondary | ICD-10-CM | POA: Diagnosis not present

## 2020-12-29 DIAGNOSIS — D509 Iron deficiency anemia, unspecified: Secondary | ICD-10-CM | POA: Diagnosis not present

## 2020-12-29 DIAGNOSIS — E119 Type 2 diabetes mellitus without complications: Secondary | ICD-10-CM | POA: Insufficient documentation

## 2020-12-29 DIAGNOSIS — D469 Myelodysplastic syndrome, unspecified: Secondary | ICD-10-CM | POA: Diagnosis not present

## 2020-12-29 LAB — RETICULOCYTES
Immature Retic Fract: 12.1 % (ref 2.3–15.9)
RBC.: 3.85 MIL/uL — ABNORMAL LOW (ref 3.87–5.11)
Retic Count, Absolute: 47 10*3/uL (ref 19.0–186.0)
Retic Ct Pct: 1.2 % (ref 0.4–3.1)

## 2020-12-29 LAB — CBC WITH DIFFERENTIAL (CANCER CENTER ONLY)
Abs Immature Granulocytes: 0 10*3/uL (ref 0.00–0.07)
Basophils Absolute: 0 10*3/uL (ref 0.0–0.1)
Basophils Relative: 1 %
Eosinophils Absolute: 0 10*3/uL (ref 0.0–0.5)
Eosinophils Relative: 1 %
HCT: 32.6 % — ABNORMAL LOW (ref 36.0–46.0)
Hemoglobin: 10.4 g/dL — ABNORMAL LOW (ref 12.0–15.0)
Immature Granulocytes: 0 %
Lymphocytes Relative: 63 %
Lymphs Abs: 1.3 10*3/uL (ref 0.7–4.0)
MCH: 27.2 pg (ref 26.0–34.0)
MCHC: 31.9 g/dL (ref 30.0–36.0)
MCV: 85.3 fL (ref 80.0–100.0)
Monocytes Absolute: 0.3 10*3/uL (ref 0.1–1.0)
Monocytes Relative: 13 %
Neutro Abs: 0.5 10*3/uL — ABNORMAL LOW (ref 1.7–7.7)
Neutrophils Relative %: 22 %
Platelet Count: 200 10*3/uL (ref 150–400)
RBC: 3.82 MIL/uL — ABNORMAL LOW (ref 3.87–5.11)
RDW: 17.8 % — ABNORMAL HIGH (ref 11.5–15.5)
WBC Count: 2.1 10*3/uL — ABNORMAL LOW (ref 4.0–10.5)
nRBC: 0 % (ref 0.0–0.2)

## 2020-12-29 LAB — CMP (CANCER CENTER ONLY)
ALT: 17 U/L (ref 0–44)
AST: 16 U/L (ref 15–41)
Albumin: 3.8 g/dL (ref 3.5–5.0)
Alkaline Phosphatase: 75 U/L (ref 38–126)
Anion gap: 5 (ref 5–15)
BUN: 13 mg/dL (ref 8–23)
CO2: 30 mmol/L (ref 22–32)
Calcium: 10 mg/dL (ref 8.9–10.3)
Chloride: 102 mmol/L (ref 98–111)
Creatinine: 0.68 mg/dL (ref 0.44–1.00)
GFR, Estimated: >60 mL/min (ref >60)
Glucose, Bld: 211 mg/dL — ABNORMAL HIGH (ref 70–99)
Potassium: 4.2 mmol/L (ref 3.5–5.1)
Sodium: 137 mmol/L (ref 135–145)
Total Bilirubin: 0.3 mg/dL (ref 0.3–1.2)
Total Protein: 7.1 g/dL (ref 6.5–8.1)

## 2020-12-29 LAB — IRON AND TIBC
Iron: 23 ug/dL — ABNORMAL LOW (ref 41–142)
Saturation Ratios: 10 % — ABNORMAL LOW (ref 21–57)
TIBC: 228 ug/dL — ABNORMAL LOW (ref 236–444)
UIBC: 204 ug/dL (ref 120–384)

## 2020-12-29 LAB — FERRITIN: Ferritin: 466 ng/mL — ABNORMAL HIGH (ref 11–307)

## 2020-12-29 MED ORDER — PEGFILGRASTIM-BMEZ 6 MG/0.6ML ~~LOC~~ SOSY
PREFILLED_SYRINGE | SUBCUTANEOUS | Status: AC
  Filled 2020-12-29: qty 0.6

## 2020-12-29 NOTE — Progress Notes (Signed)
Hematology and Oncology Follow Up Visit  Kristen Murray [id number] 1946/01/09 75 y.o. 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  We did have to get a bone marrow biopsy on her.  This was done on 10/18/2020.  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  This was consistent with myelodysplasia.  There were no blasts noted.  We did do cytogenetics.  She had 1 abnormality which was not clear.  She had a 14q rearrangement.  We did do FISH on this which was not found.  On her IPSS-R score, this was only 1.5 which would put this at very low risk.  We have given her Neulasta in the past.  This is helped her white cell count a little bit.  However, the white cell count is a little bit lower today.  She has had no fever.  She has had no problems with infection.  Her iron studies today show a ferritin of 466 and iron saturation of only 10%.  As such, going to have to give her some IV iron.  This might explain her anemia.  She does feel tired.  She does have problems with high blood sugar.  I think that her family doctor is working on this.  She has had no problems with her bowels or bladder.  Overall, I would say performance status is by ECOG 1-2.    Medications:  Current Outpatient Medications:  .  acetaminophen (TYLENOL) 325 MG tablet, Take 325-650 mg by mouth every 6 (six) hours as needed (for pain)., Disp: , Rfl:  .  apixaban (ELIQUIS) 5 MG TABS tablet, Take 1 tablet (5 mg total) by mouth 2 (two) times daily., Disp: 180 tablet, Rfl: 1 .  cyclobenzaprine (FLEXERIL) 10 MG tablet, Take 2.5 mg by mouth daily as needed for muscle spasms., Disp: , Rfl:  .  diclofenac (VOLTAREN) 0.1 %  ophthalmic solution, 4 (four) times daily., Disp: , Rfl:  .  fluorometholone (FML) 0.1 % ophthalmic suspension, Place 1 drop into both eyes every Monday, Wednesday, and Friday. , Disp: , Rfl:  .  fluticasone (FLONASE) 50 MCG/ACT nasal spray, Place 1 spray into both nostrils 2 (two) times daily., Disp: 16 g, Rfl: 5 .  glimepiride (AMARYL) 1 MG tablet, Take 1 mg by mouth daily., Disp: , Rfl:  .  glucose blood (FREESTYLE LITE) test strip, CHECK BLOOD SUGAR ONCE DAILY AS DIRECTED.  DX:790.29, Disp: 100 each, Rfl: 12 .  hydrocortisone 1 % ointment, Apply 1 application topically 2 (two) times daily., Disp: 30 g, Rfl: 0 .  Lancets (FREESTYLE) lancets, Check blood sugar once daily as directed DX:790.29 (FREESTYLE FREEDOM LITE), Disp: 100 each, Rfl: 1 .  loratadine (CLARITIN) 10 MG tablet, Take 1 tablet (10 mg total) by mouth daily., Disp: 30 tablet, Rfl: 0 .  meclizine (ANTIVERT) 25 MG tablet, 1 tablet as needed, Disp: , Rfl:  .  metoprolol tartrate (LOPRESSOR) 50 MG tablet, TAKE 1 AND 1/2 TABLETS BY  MOUTH TWICE DAILY, Disp: 270 tablet, Rfl: 3 .  montelukast (SINGULAIR) 10 MG tablet, Take 1 tablet (10 mg total) by mouth at bedtime., Disp: 30 tablet, Rfl: 5 .  Multiple Vitamin (MULTIVITAMIN) tablet,  Take 1 tablet by mouth daily. Shaklee brand, Disp: , Rfl:  .  pantoprazole (PROTONIX) 40 MG tablet, Take 40 mg by mouth 2 (two) times daily., Disp: , Rfl:  .  rosuvastatin (CRESTOR) 5 MG tablet, Take 5 mg by mouth See admin instructions. Take 5 mg by mouth at bedtime on Mondays and Thursdays..Currently on hold, Disp: , Rfl:  .  traMADol (ULTRAM) 50 MG tablet, tramadol 50 mg tablet  Take 1 tablet every 6 hours by oral route as needed., Disp: , Rfl:  .  trolamine salicylate (ASPERCREME) 10 % cream, Apply 1 application topically as needed for muscle pain., Disp: , Rfl:  .  verapamil (CALAN-SR) 180 MG CR tablet, Take 180 mg by mouth at bedtime. , Disp: , Rfl:   Allergies:  Allergies  Allergen Reactions  .  Codeine Hives and Other (See Comments)    Because of a history of documented adverse serious drug reaction, Medi Alert bracelet  is recommended  . Macrodantin [Nitrofurantoin Macrocrystal] Other (See Comments)    Numbness, aching all over  . Other Shortness Of Breath, Rash and Other (See Comments)    Pine nuts & walnuts throat congestion. No documented angioedema. Avocado- Rash  . Propoxyphene Hives, Swelling and Rash  . Propoxyphene Hcl Hives, Swelling and Rash  . Latex Rash  . Banana Rash  . 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 Tizanidine Other (See Comments)    Reaction not recalled  . Tramadol Nausea Only    Past Medical History, Surgical history, Social history, and Family History were reviewed and updated.  Review of Systems: Review of Systems  Constitutional: Negative.   HENT:  Negative.   Eyes: Negative.   Respiratory: Negative.   Cardiovascular: Negative.   Gastrointestinal: Negative.   Endocrine: Negative.   Genitourinary: Negative.    Musculoskeletal: Negative.   Skin: Negative.   Neurological: Negative.   Hematological: Negative.   Psychiatric/Behavioral: Negative.     Physical Exam:  height is _0  (1.626 m) and weight is 171 lb 6.4 oz (77.7 kg). Her oral temperature is 98.8 F (37.1 C). Her blood pressure is 105/74 and her pulse is 75. Her respiration is 18 and oxygen saturation is 100%.   Wt Readings from Last 3 Encounters:  12/29/20 171 lb 6.4 oz (77.7 kg)  12/21/20 169 lb (76.7 kg)  12/06/20 172 lb (78 kg)    Physical Exam Vitals reviewed.  HENT:     Head: Normocephalic and atraumatic.  Eyes:     Pupils: Pupils are equal, round, and reactive to light.  Cardiovascular:     Rate and Rhythm: Normal rate and regular rhythm.     Heart sounds: Normal heart sounds.  Pulmonary:     Effort: Pulmonary effort is normal.     Breath sounds: Normal breath sounds.   Abdominal:     General: Bowel sounds are normal.     Palpations: Abdomen is soft.  Musculoskeletal:        General: No tenderness or deformity. Normal range of motion.     Cervical back: Normal range of motion.     Comments: There is moderate swelling of the left calf area.  This is somewhat tender to palpation.  There is no redness.  I cannot palpate any obvious venous cord.  There is no pain behind the left knee.  She has decent pulses in her distal extremities.  Lymphadenopathy:  Cervical: No cervical adenopathy.  Skin:    General: Skin is warm and dry.     Findings: No erythema or rash.  Neurological:     Mental Status: She is alert and oriented to person, place, and time.  Psychiatric:        Behavior: Behavior normal.        Thought Content: Thought content normal.        Judgment: Judgment normal.      Lab Results  Component Value Date   WBC 2.1 (L) 12/29/2020   HGB 10.4 (L) 12/29/2020   HCT 32.6 (L) 12/29/2020   MCV 85.3 12/29/2020   PLT 200 12/29/2020     Chemistry      Component Value Date/Time   NA 137 12/29/2020 0754   NA 138 03/10/2020 1138   K 4.2 12/29/2020 0754   CL 102 12/29/2020 0754   CO2 30 12/29/2020 0754   BUN 13 12/29/2020 0754   BUN 10 03/10/2020 1138   CREATININE 0.68 12/29/2020 0754      Component Value Date/Time   CALCIUM 10.0 12/29/2020 0754   ALKPHOS 75 12/29/2020 0754   AST 16 12/29/2020 0754   ALT 17 12/29/2020 0754   BILITOT 0.3 12/29/2020 0754       Impression and Plan: Kristen Murray is a 75 year old African-American female.   Looks like she does have a low-grade myelodysplasia.  Again she has a very low IPSS-R score.  We will go ahead and give her IV iron today.  Her erythropoietin level is only 31.  As such, we can always utilize ESA if necessary.  I am not going to give her Neulasta today.  I would like to see her back in about 3 weeks.  By this time, we should be able to see a nice bump in her hemoglobin.  If not,  then we will probably have to use ESA on her.   [name], MD 3/2/20228:47 AM

## 2020-12-29 NOTE — Telephone Encounter (Addendum)
-----   Message from [name], MD sent at 12/29/2020 11:58 AM EST ----- Called patient to let her know the iron is very low!! She needs a good dose of IV iron!! Patient called and appt made

## 2020-12-29 NOTE — Telephone Encounter (Signed)
-----   Message from [name], MD sent at 12/29/2020 11:58 AM EST ----- Call - the iron is very low!! She needs a good dose of IV iron!!  Laurey Arrow

## 2020-12-31 ENCOUNTER — Other Ambulatory Visit: Payer: Self-pay

## 2020-12-31 ENCOUNTER — Inpatient Hospital Stay: Payer: Medicare Other

## 2020-12-31 VITALS — BP 121/53 | HR 63 | Temp 98.6°F

## 2020-12-31 DIAGNOSIS — D72819 Decreased white blood cell count, unspecified: Secondary | ICD-10-CM

## 2020-12-31 DIAGNOSIS — D500 Iron deficiency anemia secondary to blood loss (chronic): Secondary | ICD-10-CM

## 2020-12-31 DIAGNOSIS — D4620 Refractory anemia with excess of blasts, unspecified: Secondary | ICD-10-CM

## 2020-12-31 DIAGNOSIS — D469 Myelodysplastic syndrome, unspecified: Secondary | ICD-10-CM | POA: Diagnosis not present

## 2020-12-31 MED ORDER — PEGFILGRASTIM-BMEZ 6 MG/0.6ML ~~LOC~~ SOSY
6.0000 mg | PREFILLED_SYRINGE | Freq: Once | SUBCUTANEOUS | Status: AC
  Administered 2020-12-31: 6 mg via SUBCUTANEOUS

## 2020-12-31 MED ORDER — SODIUM CHLORIDE 0.9 % IV SOLN
200.0000 mg | Freq: Once | INTRAVENOUS | Status: AC
  Administered 2020-12-31: 200 mg via INTRAVENOUS
  Filled 2020-12-31: qty 200

## 2020-12-31 MED ORDER — SODIUM CHLORIDE 0.9 % IV SOLN
Freq: Once | INTRAVENOUS | Status: AC
  Filled 2020-12-31: qty 250

## 2020-12-31 MED ORDER — PEGFILGRASTIM-BMEZ 6 MG/0.6ML ~~LOC~~ SOSY
PREFILLED_SYRINGE | SUBCUTANEOUS | Status: AC
  Filled 2020-12-31: qty 0.6

## 2020-12-31 NOTE — Patient Instructions (Signed)

## 2021-01-03 ENCOUNTER — Encounter: Payer: Self-pay | Admitting: Physical Therapy

## 2021-01-03 ENCOUNTER — Other Ambulatory Visit: Payer: Self-pay

## 2021-01-03 ENCOUNTER — Ambulatory Visit: Payer: Medicare Other | Attending: Internal Medicine | Admitting: Physical Therapy

## 2021-01-03 DIAGNOSIS — H8111 Benign paroxysmal vertigo, right ear: Secondary | ICD-10-CM | POA: Insufficient documentation

## 2021-01-03 DIAGNOSIS — R42 Dizziness and giddiness: Secondary | ICD-10-CM | POA: Insufficient documentation

## 2021-01-03 DIAGNOSIS — R2681 Unsteadiness on feet: Secondary | ICD-10-CM | POA: Insufficient documentation

## 2021-01-03 NOTE — Therapy (Signed)
[facility] [address]  [place], [place], [address] Phone: [phone]   Fax:  [phone]  Physical Therapy Treatment  Patient Details  Name: Kristen Murray MRN: [id number] Date of Birth: 21-Jan-1946 Referring Provider (PT): [name], MD   Encounter Date: 01/03/2021   PT End of Session - 01/03/21 1018    Visit Number 5    Number of Visits 13    Date for PT Re-Evaluation 01/20/21    Authorization Type [employer] Medicare    PT Start Time 0933    PT Stop Time 1014    PT Time Calculation (min) 41 min    Equipment Utilized During Treatment Gait belt    Activity Tolerance Patient tolerated treatment well   limited by dizziness   Behavior During Therapy [facility] for tasks assessed/performed           Past Medical History:  Diagnosis Date  . Arthritis   . Borderline abnormal TFTs    as a teen  . 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 Diabetes mellitus without complication ([place])   . E. coli UTI 10/05/12   [facility]; resistant only to Septra DS & tetracycline  . Eye infection   . Fibromyalgia   . GERD (gastroesophageal reflux disease)   . Headache(784.0)   . 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 HSV-1 infection   . HTN (hypertension)   . Hyperglycemia   . Hyperlipidemia   . Meningioma ([place])   . Myelodysplasia, low grade ([place]) 11/18/2020  . Nephrolithiasis    Dr Amalia Hailey, [facility], Hx of [3][  . Polyp of colon   . Radiculopathy     Past Surgical History:  Procedure Laterality Date  . ABDOMINAL HYSTERECTOMY     TAH/BSO --fibroids, no cancer  . BALLOON DILATION N/A 07/20/2014   Procedure: BALLOON DILATION;  Surgeon: Garlan Fair, MD;  Location: [facility];  Service: Endoscopy;  Laterality: N/A;  . BREAST BIOPSY     Right  . CARPAL TUNNEL RELEASE     & trigger thumb RUE; Dr Alphonzo Cruise  . COLONOSCOPY W/ POLYPECTOMY  07/2004   Neg 2011; Dr Earle Gell  . CYSTOSCOPY  11/2009   Neg  .  ESOPHAGOGASTRODUODENOSCOPY N/A 07/20/2014   Procedure: ESOPHAGOGASTRODUODENOSCOPY (EGD);  Surgeon: Garlan Fair, MD;  Location: [facility];  Service: Endoscopy;  Laterality: N/A;  . FLEXIBLE BRONCHOSCOPY W/ UPPER ENDOSCOPY     neg  . KNEE ARTHROSCOPY     Left  . ROTATOR CUFF REPAIR     R shoulder  . TONSILLECTOMY    . TRIGGER FINGER RELEASE Right    Right Thumb    There were no vitals filed for this visit.   Subjective Assessment - 01/03/21 0934    Subjective Reports that after her last session she had a severe HA that lasted for 3 days. Also mentions that the spring blossoms probably didnt help d/t her allergies. Reports that when getting out of the bed she does not notice much dizziness now. Tried to do her HEP a time or two and that was okay. One of the only things that she notices "wooziness" with is laying down and putting her eyedrops in. Dizziness currently 3/10.    Pertinent History radiculopathy, HLD, hyperglycemia, HTN, HA, GERD, fibromyalgia, DM, R carpal tunnel release, L knee arthroscopy, R RTC repair    Diagnostic tests 12/01/20 head CT:  No acute intracranial pathology  Patient Stated Goals decrease dizziness    Currently in Pain? No/denies                              Vestibular Treatment/Exercise - 01/03/21 0001      Vestibular Treatment/Exercise   Habituation Exercises Legrand Como Daroff   Number of Reps  3    Symptom Description  R; no dizziness 3x with EO, 3x with EC 5-6/10 dizziness on last rep              Balance Exercises - 01/03/21 0001      Balance Exercises: Standing   Gait with Head Turns Forward   + CGA and VOR vertical and horizontal 4x47ft; mild dizziness   Other Standing Exercises walking around gym looking for cones for motion sensitivity x7 min   CGA for safety, good tolerance   Other Standing Exercises Comments ring toss + bending forward and R/L to pick up rings from elevated and floor surface x4  min   no LOB; c/o mild dizziness            PT Education - 01/03/21 1015    Education Details update to HEP- Nestor Lewandowsky to R with EC    Person(s) Educated Patient    Methods Explanation;Demonstration;Tactile cues;Verbal cues    Comprehension Verbalized understanding;Returned demonstration            PT Short Term Goals - 12/13/20 1532      PT SHORT TERM GOAL #1   Title Patient to report understanding and compliance with post-[place] precautions.    Time 2    Period Weeks    Status Achieved             PT Long Term Goals - 12/13/20 1532      PT LONG TERM GOAL #1   Title Patient to be independent with vestibular HEP as needed for dizziness.    Time 6    Period Weeks    Status On-going      PT LONG TERM GOAL #2   Title Patient to demonstrate negative R [employer].    Time 6    Period Weeks    Status On-going      PT LONG TERM GOAL #3   Title Patient to report no dizziness with bed mobility.    Time 6    Period Weeks    Status On-going      PT LONG TERM GOAL #4   Title Patient to report no dizziness and demonstrate no LOB when ambulating with SPC while performing head turns.    Time 6    Period Weeks    Status On-going                 Plan - 01/03/21 1018    Clinical Impression Statement Patient arrived to session with report of having had a HA for 3 days after last appointment. Still noting worsening allergies d/t the weather. Noting no remaining dizziness when sitting up from supine, however does note some dizziness when laying down to put her eye drops in. Simulated this position with patient supporting herself on her L elbow with head slightly tilted back, however patient did not report dizziness with this position or coming up from this position. Re-assessed [place] with patient demonstrating good carryover and without c/o dizziness. Able to increase challenge with this exercise with EC, with patient  reporting slightly increased dizziness  but able to manage/tolerate. Worked on dynamic balance and motion sensitivity exercises with CGA for safety and patient tolerating this well. Updated HEP with Nestor Lewandowsky with EC- patient reported understanding and without complaints at end of session. Patient is progressing well towards goals.    Comorbidities radiculopathy, HLD, hyperglycemia, HTN, HA, GERD, fibromyalgia, DM, R carpal tunnel release, L knee arthroscopy, R RTC repair    PT Treatment/Interventions ADLs/Self Care Home Management;Canalith Repostioning;Cryotherapy;Moist Heat;Therapeutic activities;Functional mobility training;Stair training;Gait training;Therapeutic exercise;Balance training;Neuromuscular re-education;Patient/family education;Vestibular    PT Next Visit Plan continue habiutation & VOR    Consulted and Agree with Plan of Care Patient           Patient will benefit from skilled therapeutic intervention in order to improve the following deficits and impairments:     Visit Diagnosis: BPPV (benign paroxysmal positional vertigo), right  Dizziness and giddiness  Unsteadiness on feet     Problem List Patient Active Problem List   Diagnosis Date Noted  . Myelodysplasia, low grade ([place]) 11/18/2020  . 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 Atrial flutter ([place]) 01/29/2020  . A-fib ([place]) 01/27/2020  . Atrial flutter, paroxysmal ([place]) 01/26/2020  . Iron deficiency anemia due to chronic blood loss 07/18/2019  . Pain in the chest   . Chest pain at rest 11/26/2014  . Shingles 05/14/2014  . 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 Vaginal atrophy 10/16/2012  . Menopause 10/16/2012  . Chest pain, atypical 02/19/2012  . 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 COSTOCHONDRITIS 11/16/2010  . DISTURBANCES OF SENSATION OF SMELL AND TASTE 04/05/2010  . NEPHROLITHIASIS, HX OF 02/08/2010  . EDEMA 10/01/2009  . Brachial neuritis or radiculitis NOS 06/14/2009  . EXTERNAL HEMORRHOIDS  05/25/2009  . MUSCLE PAIN 05/17/2009  . Hyperlipidemia 04/23/2009  . ABNORMAL THYROID FUNCTION TESTS 04/23/2009  . MENINGIOMA 12/26/2007  . HEADACHE 12/26/2007  . RADICULOPATHY 04/02/2007  . Essential hypertension 11/21/2006  . GERD 11/21/2006     Janene Harvey, PT, DPT 01/03/21 10:21 AM   [facility] [address]  [place], [place], [address] Phone: [phone]   Fax:  [phone]  Name: Kristen Murray MRN: [id number] Date of Birth: [birth year]

## 2021-01-11 ENCOUNTER — Other Ambulatory Visit: Payer: Self-pay

## 2021-01-11 ENCOUNTER — Ambulatory Visit: Payer: Medicare Other | Admitting: Physician Assistant

## 2021-01-11 ENCOUNTER — Encounter: Payer: Self-pay | Admitting: Physician Assistant

## 2021-01-11 VITALS — BP 120/44 | HR 68 | Ht 64.0 in | Wt 171.0 lb

## 2021-01-11 DIAGNOSIS — I10 Essential (primary) hypertension: Secondary | ICD-10-CM

## 2021-01-11 DIAGNOSIS — I4892 Unspecified atrial flutter: Secondary | ICD-10-CM | POA: Diagnosis not present

## 2021-01-11 DIAGNOSIS — I209 Angina pectoris, unspecified: Secondary | ICD-10-CM | POA: Diagnosis not present

## 2021-01-11 DIAGNOSIS — D509 Iron deficiency anemia, unspecified: Secondary | ICD-10-CM

## 2021-01-11 MED ORDER — NITROGLYCERIN 0.4 MG SL SUBL: 0.4000 mg | SUBLINGUAL_TABLET | SUBLINGUAL | 3 refills | Status: AC | PRN

## 2021-01-11 NOTE — Patient Instructions (Signed)
Medication Instructions:  Your physician has recommended you make the following change in your medication:   1.   Nitroglycerin (0.4 mg) SL was sent in to your pharmacy today.   If a single episode of chest pain is not relieved by one tablet, the patient will try another tablet  within 5 minutes; and if this doesn't relieve the pain, the patient will try another within 5 minutes and if this doesn't relieve the pain the patient is instructed to call 911 for transportation to an emergency department.  *If you need a refill on your cardiac medications before your next appointment, please call your pharmacy*   Lab Work: Labs will be drawn at Dr. Antonieta Pert office.   If you have labs (blood work) drawn today and your tests are completely normal, you will receive your results only by: Marland Kitchen MyChart Message (if you have MyChart) OR . A paper copy in the mail If you have any lab test that is abnormal or we need to change your treatment, we will call you to review the results.   Testing/Procedures: Your cardiac CT will be scheduled at one of the below locations:   [facility] [address] [place], [place] [address] [phone]   If scheduled at [facility], please arrive at the [facility] main entrance (entrance A) of [facility] 30 minutes prior to test start time. Proceed to the [facility] Radiology Department (first floor) to check-in and test prep.  I Please follow these instructions carefully (unless otherwise directed):   On the Night Before the Test: . Be sure to Drink plenty of water. . Do not consume any caffeinated/decaffeinated beverages or chocolate 12 hours prior to your test. . Do not take any antihistamines 12 hours prior to your test.   On the Day of the Test: . Drink plenty of water until 1 hour prior to the test. . Do not eat any food 4 hours prior to the test. . You may take your regular medications prior to the test.  . Take  metoprolol (Lopressor) two hours prior to test. . FEMALES- please wear underwire-free bra if available        -Drink plenty of water         -Take metoprolol (Lopressor) 2 hours prior to test (if applicable).                         -IF HR is greater than 55 BPM and patient is less than or equal to 23 yrs old Lopressor 100mg  x1.       After the Test: . Drink plenty of water. . After receiving IV contrast, you may experience a mild flushed feeling. This is normal. . On occasion, you may experience a mild rash up to 24 hours after the test. This is not dangerous. If this occurs, you can take Benadryl 25 mg and increase your fluid intake. . If you experience trouble breathing, this can be serious. If it is severe call 911 IMMEDIATELY. If it is mild, please call our office. . If you take any of these medications: Glipizide/Metformin, Avandament, Glucavance, please do not take 48 hours after completing test unless otherwise instructed.   Once we have confirmed authorization from your insurance company, we will call you to set up a date and time for your test. Based on how quickly your insurance processes prior authorizations requests, please allow up to 4 weeks to be contacted for  scheduling your Cardiac CT appointment. Be advised that routine Cardiac CT appointments could be scheduled as many as 8 weeks after your provider has ordered it.  For non-scheduling related questions, please contact the cardiac imaging nurse navigator should you have any questions/concerns: Marchia Bond, Cardiac Imaging Nurse Navigator Gordy Clement, Cardiac Imaging Nurse Navigator [facility] Direct Office Dial: [phone]   For scheduling needs, including cancellations and rescheduling, please call [place], [phone].     Follow-Up: At [facility], you and your health needs are our priority.  As part of our continuing mission to provide you with exceptional heart care, we have  created designated Provider Care Teams.  These Care Teams include your primary Cardiologist (physician) and Advanced Practice Providers (APPs -  Physician Assistants and Nurse Practitioners) who all work together to provide you with the care you need, when you need it.  We recommend signing up for the patient portal called "MyChart".  Sign up information is provided on this After Visit Summary.  MyChart is used to connect with patients for Virtual Visits (Telemedicine).  Patients are able to view lab/test results, encounter notes, upcoming appointments, etc.  Non-urgent messages can be sent to your provider as well.   To learn more about what you can do with MyChart, go to [email or web address].    Your next appointment:   4 week(s)  The format for your next appointment:   In Person  Provider:   Mertie Moores, MD

## 2021-01-11 NOTE — Progress Notes (Signed)
Cardiology Office Note:    Date:  01/11/2021   ID:  Kristen Murray, DOB [birth year], MRN [id number]  PCP:  Kristen Murray, Kristen Murray  Cardiologist:  Kristen Moores, MD   Advanced Practice Provider:  [name], PA-C Electrophysiologist:  None       Referring MD: Kristen Orn, MD   Chief Complaint:  Follow-up (AFib)    Patient Profile:    Kristen Murray is a 75 y.o. female with:   Atrial fibrillation/flutter ? CHA2DS2-VASc=4 (female, HTN, Diab, age x 1)  ? Admitted 12/2019; Apixaban started; conv to NSR w/ Dilt  Diabetes mellitus  Hyperlipidemia (managed by PCP)  Hypertension  GERD  Iron deficiency anemia  Myelodysplasia   Migraine HAs ? Verapamil; followed by Neuro at [facility] Kristen Murray)  OSA   Prior CV studies: Myoview 01/30/2020 EF 76, no ischemia or infarction, low risk  Echocardiogram 01/28/2020 EF 60-65, no RWMA, moderate LVH, normal RV SF, trivial MR, trivial AI  Myoview 06/06/2019 Perfusion normal, EF 70, low risk  History of Present Illness:    [name] was last seen by Dr. Acie Murray in 7/21.  She returns for f/u.  She is here alone.  She notes significant symptoms of fatigue.  She is fairly sedentary.  She has not had significant shortness of breath.  She continues to have episodes of upper chest discomfort with associated jaw discomfort.  This is not necessarily related to exertion.  She does not have associated nausea or diaphoresis. She has not had syncope, orthopnea, leg edema.        Past Medical History:  Diagnosis Date  . Arthritis   . Borderline abnormal TFTs    as a teen  . 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 Diabetes mellitus without complication ([place])   . E. coli UTI 10/05/12   [facility]; resistant only to Septra DS & tetracycline  . Eye infection   . Fibromyalgia   . GERD (gastroesophageal reflux disease)   . Headache(784.0)   . 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 HSV-1  infection   . HTN (hypertension)   . Hyperglycemia   . Hyperlipidemia   . Meningioma ([place])   . 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 Nephrolithiasis    Dr Kristen Murray, [facility], Hx of [3][  . Polyp of colon   . Radiculopathy     Current Medications: Current Meds  Medication Sig  . acetaminophen (TYLENOL) 325 MG tablet Take 325-650 mg by mouth every 6 (six) hours as needed (for pain).  Marland Kitchen apixaban (ELIQUIS) 5 MG TABS tablet Take 1 tablet (5 mg total) by mouth 2 (two) times daily.  . cyclobenzaprine (FLEXERIL) 10 MG tablet Take 2.5 mg by mouth daily as needed for muscle spasms.  . diclofenac (VOLTAREN) 0.1 % ophthalmic solution 4 (four) times daily.  . ferrous gluconate (FERGON) 240 (27 FE) MG tablet Take 27 mg by mouth daily.  . fluorometholone (FML) 0.1 % ophthalmic suspension Place 1 drop into both eyes every Monday, Wednesday, and Friday.   . fluticasone (FLONASE) 50 MCG/ACT nasal spray Place 1 spray into both nostrils 2 (two) times daily.  Marland Kitchen glimepiride (AMARYL) 1 MG tablet Take 1 mg by mouth daily.  Marland Kitchen glucose blood (FREESTYLE LITE) test strip CHECK BLOOD SUGAR ONCE DAILY AS DIRECTED.  DX:790.29  . hydrocortisone 1 % ointment Apply 1 application topically 2 (two) times daily.  . Lancets (FREESTYLE) lancets Check blood sugar  once daily as directed DX:790.29 (FREESTYLE FREEDOM LITE)  . loratadine (CLARITIN) 10 MG tablet Take 1 tablet (10 mg total) by mouth daily.  . meclizine (ANTIVERT) 25 MG tablet Take 25 mg by mouth as needed for dizziness.  . metoprolol tartrate (LOPRESSOR) 50 MG tablet TAKE 1 AND 1/2 TABLETS BY  MOUTH TWICE DAILY  . montelukast (SINGULAIR) 10 MG tablet Take 1 tablet (10 mg total) by mouth at bedtime.  . Multiple Vitamin (MULTIVITAMIN) tablet Take 1 tablet by mouth daily. Shaklee brand  . nitroGLYCERIN (NITROSTAT) 0.4 MG SL tablet Place 1 tablet (0.4 mg total) under the tongue every 5 (five) minutes as needed for chest pain.  . pantoprazole (PROTONIX) 40 MG tablet  Take 40 mg by mouth 2 (two) times daily.  . rosuvastatin (CRESTOR) 5 MG tablet Take 5 mg by mouth See admin instructions. Take 5 mg by mouth at bedtime on Mondays and Thursdays..Currently on hold  . traMADol (ULTRAM) 50 MG tablet tramadol 50 mg tablet  Take 1 tablet every 6 hours by oral route as needed.  . trolamine salicylate (ASPERCREME) 10 % cream Apply 1 application topically as needed for muscle pain.  . verapamil (CALAN-SR) 180 MG CR tablet Take 180 mg by mouth at bedtime.      Allergies:   Codeine, Macrodantin [nitrofurantoin macrocrystal], Other, Propoxyphene, Propoxyphene hcl, Latex, Banana, Ciprofloxacin, Tizanidine, and Tramadol   Social History   Tobacco Use  . Smoking status: Never Smoker  . Smokeless tobacco: Never Used  Vaping Use  . Vaping Use: Never used  Substance Use Topics  . Alcohol use: No    Alcohol/week: 0.0 standard drinks  . Drug use: No     Family Hx: The patient's family history includes Alcohol abuse in her mother; Diabetes in her maternal aunt, maternal grandmother, and maternal uncle; Other in her father; Tuberculosis in her maternal uncle. There is no history of Coronary artery disease.  ROS   EKGs/Labs/Other Test Reviewed:    EKG:  EKG is   ordered today.  The ekg ordered today demonstrates normal sinus rhythm, heart rate 68, normal axis, no ST-T wave changes, QTC 418  Recent Labs: 01/27/2020: TSH 3.229 01/30/2020: Magnesium 2.0 12/29/2020: ALT 17; BUN 13; Creatinine 0.68; Hemoglobin 10.4; Platelet Count 200; Potassium 4.2; Sodium 137   Recent Lipid Panel Lab Results  Component Value Date/Time   CHOL 164 01/27/2020 06:35 PM   TRIG 85 01/27/2020 06:35 PM   HDL 44 01/27/2020 06:35 PM   CHOLHDL 3.7 01/27/2020 06:35 PM   LDLCALC 103 (H) 01/27/2020 06:35 PM      Risk Assessment/Calculations:    CHA2DS2-VASc Score = 5  This indicates a 7.2% annual risk of stroke. The patient's score is based upon: CHF History: No HTN History: Yes Diabetes  History: Yes Stroke History: No Vascular Disease History: No Age Score: 2 Gender Score: 1     Physical Exam:    VS:  BP (!) 120/44   Pulse 68   Ht 5\' 4"  (1.626 m)   Wt 171 lb (77.6 kg)   LMP 07/30/1990   SpO2 96%   BMI 29.35 kg/m     Wt Readings from Last 3 Encounters:  01/11/21 171 lb (77.6 kg)  12/29/20 171 lb 6.4 oz (77.7 kg)  12/21/20 169 lb (76.7 kg)     Constitutional:      Appearance: Healthy appearance. Not in distress.  Neck:     Vascular: JVD normal.  Pulmonary:     Effort: Pulmonary  effort is normal.     Breath sounds: No wheezing. No rales.  Cardiovascular:     Normal rate. Regular rhythm. Normal S1. Normal S2.     Murmurs: There is no murmur.  Edema:    Peripheral edema absent.  Abdominal:     Palpations: Abdomen is soft.  Skin:    General: Skin is warm and dry.  Neurological:     Mental Status: Alert and oriented to person, place and time.     Cranial Nerves: Cranial nerves are intact.       ASSESSMENT & PLAN:    1. Angina pectoris ([place]) She continues to have episodes of chest discomfort that are suspicious for angina.  Her electrocardiogram does not demonstrate any acute changes.  She has not really had exertional symptoms.  She presented to the hospital in March 2021 with similar symptoms and she was in atrial flutter.  During her initial hospitalization, coronary CTA was planned but could not be done due to elevated heart rate.  Her Myoview was low risk.  Her heart rate now is low enough that we could get a good CT study.  I have recommended proceeding with coronary CTA to better define her symptoms.  If she does not have significant CAD, we may need to have her wear a heart monitor to see if she is having recurrent atrial flutter contributing to her symptoms.  -Arrange coronary CTA  -Continue metoprolol, rosuvastatin  -She is not on aspirin as she is on Apixaban  -Follow-up 4 weeks  2. Atrial flutter, paroxysmal (H[facility]) Maintaining sinus  rhythm.  As noted, if her coronary CTA does not demonstrate significant CAD, consider 30-day event monitor to assess for recurrent atrial flutter contributing to her symptoms.  Recent creatinine normal.  Recent Hgb was 10.4.  Continue current dose of metoprolol tartrate, verapamil, Apixaban.  3. Essential hypertension Diastolic blood pressure is somewhat low.  I would like to keep her metoprolol at the same dose for now so that we can get a good CT study.  If her coronary CTA is without significant CAD, we can try to decrease her metoprolol tartrate to 50 mg twice daily to see if this improves any of her fatigue.  4. Iron deficiency anemia, unspecified iron deficiency anemia type She has a history of myelodysplasia.  She is followed by Dr. Marin Olp with hematology.  She just had an iron infusion and has follow-up next week.   Dispo:  Return in about 4 weeks (around 02/08/2021) for Follow up after testing, w/ Dr. Acie Murray, or Richardson Dopp, PA-C, in person.   Medication Adjustments/Labs and Tests Ordered: Current medicines are reviewed at length with the patient today.  Concerns regarding medicines are outlined above.  Tests Ordered: Orders Placed This Encounter  Procedures  . CT CORONARY MORPH W/CTA COR W/SCORE W/CA W/CM &/OR WO/CM  . CT CORONARY FRACTIONAL FLOW RESERVE DATA PREP  . CT CORONARY FRACTIONAL FLOW RESERVE FLUID ANALYSIS  . EKG 12-Lead   Medication Changes: Meds ordered this encounter  Medications  . nitroGLYCERIN (NITROSTAT) 0.4 MG SL tablet    Sig: Place 1 tablet (0.4 mg total) under the tongue every 5 (five) minutes as needed for chest pain.    Dispense:  25 tablet    Refill:  3    Signed, Richardson Dopp, PA-C  01/11/2021 4:58 PM    [facility] [place], [place], [place]  [address] Phone: [phone]; Fax: [phone]

## 2021-01-12 ENCOUNTER — Ambulatory Visit: Payer: Medicare Other | Admitting: Physical Therapy

## 2021-01-19 ENCOUNTER — Telehealth: Payer: Self-pay | Admitting: Hematology & Oncology

## 2021-01-19 ENCOUNTER — Inpatient Hospital Stay: Payer: Medicare Other

## 2021-01-19 ENCOUNTER — Inpatient Hospital Stay (HOSPITAL_BASED_OUTPATIENT_CLINIC_OR_DEPARTMENT_OTHER): Payer: Medicare Other | Admitting: Hematology & Oncology

## 2021-01-19 ENCOUNTER — Other Ambulatory Visit: Payer: Self-pay

## 2021-01-19 ENCOUNTER — Encounter: Payer: Self-pay | Admitting: Hematology & Oncology

## 2021-01-19 VITALS — BP 129/58 | HR 85 | Temp 98.8°F | Resp 18 | Wt 166.0 lb

## 2021-01-19 DIAGNOSIS — D4620 Refractory anemia with excess of blasts, unspecified: Secondary | ICD-10-CM

## 2021-01-19 DIAGNOSIS — D469 Myelodysplastic syndrome, unspecified: Secondary | ICD-10-CM | POA: Diagnosis not present

## 2021-01-19 DIAGNOSIS — D46Z Other myelodysplastic syndromes: Secondary | ICD-10-CM

## 2021-01-19 LAB — RETICULOCYTES
Immature Retic Fract: 7.6 % (ref 2.3–15.9)
RBC.: 4.22 MIL/uL (ref 3.87–5.11)
Retic Count, Absolute: 50.2 10*3/uL (ref 19.0–186.0)
Retic Ct Pct: 1.2 % (ref 0.4–3.1)

## 2021-01-19 LAB — CBC WITH DIFFERENTIAL (CANCER CENTER ONLY)
Abs Immature Granulocytes: 0.01 10*3/uL (ref 0.00–0.07)
Basophils Absolute: 0 10*3/uL (ref 0.0–0.1)
Basophils Relative: 0 %
Eosinophils Absolute: 0 10*3/uL (ref 0.0–0.5)
Eosinophils Relative: 0 %
HCT: 37 % (ref 36.0–46.0)
Hemoglobin: 11.7 g/dL — ABNORMAL LOW (ref 12.0–15.0)
Immature Granulocytes: 0 %
Lymphocytes Relative: 68 %
Lymphs Abs: 1.6 10*3/uL (ref 0.7–4.0)
MCH: 27.2 pg (ref 26.0–34.0)
MCHC: 31.6 g/dL (ref 30.0–36.0)
MCV: 86 fL (ref 80.0–100.0)
Monocytes Absolute: 0.3 10*3/uL (ref 0.1–1.0)
Monocytes Relative: 12 %
Neutro Abs: 0.5 10*3/uL — ABNORMAL LOW (ref 1.7–7.7)
Neutrophils Relative %: 20 %
Platelet Count: 227 10*3/uL (ref 150–400)
RBC: 4.3 MIL/uL (ref 3.87–5.11)
RDW: 18.1 % — ABNORMAL HIGH (ref 11.5–15.5)
WBC Count: 2.4 10*3/uL — ABNORMAL LOW (ref 4.0–10.5)
nRBC: 0 % (ref 0.0–0.2)

## 2021-01-19 LAB — CMP (CANCER CENTER ONLY)
ALT: 13 U/L (ref 0–44)
AST: 14 U/L — ABNORMAL LOW (ref 15–41)
Albumin: 4.1 g/dL (ref 3.5–5.0)
Alkaline Phosphatase: 88 U/L (ref 38–126)
Anion gap: 7 (ref 5–15)
BUN: 16 mg/dL (ref 8–23)
CO2: 31 mmol/L (ref 22–32)
Calcium: 10.5 mg/dL — ABNORMAL HIGH (ref 8.9–10.3)
Chloride: 100 mmol/L (ref 98–111)
Creatinine: 0.72 mg/dL (ref 0.44–1.00)
GFR, Estimated: >60 mL/min (ref >60)
Glucose, Bld: 231 mg/dL — ABNORMAL HIGH (ref 70–99)
Potassium: 4.4 mmol/L (ref 3.5–5.1)
Sodium: 138 mmol/L (ref 135–145)
Total Bilirubin: 0.6 mg/dL (ref 0.3–1.2)
Total Protein: 7.9 g/dL (ref 6.5–8.1)

## 2021-01-19 LAB — LACTATE DEHYDROGENASE: LDH: 166 U/L (ref 98–192)

## 2021-01-19 LAB — IRON AND TIBC
Iron: 54 ug/dL (ref 41–142)
Saturation Ratios: 22 % (ref 21–57)
TIBC: 246 ug/dL (ref 236–444)
UIBC: 192 ug/dL (ref 120–384)

## 2021-01-19 LAB — FERRITIN: Ferritin: 792 ng/mL — ABNORMAL HIGH (ref 11–307)

## 2021-01-19 NOTE — Telephone Encounter (Signed)
Appointments scheduled calendar printed per 3/23 los

## 2021-01-19 NOTE — Progress Notes (Signed)
Hematology and Oncology Follow Up Visit  Kristen Murray [id number] Aug 25, 1946 75 y.o. 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  Overall she is doing pretty well.  We last saw her back in early March, her ferritin was 466 with an iron saturation of 10%.  We did going give her some IV iron.  She does feel a little bit better.  She still feels little bit tired.  She has had no problems with fever.  She has had no issues with nausea or vomiting.  There has been no change in bowel or bladder habits.  She has had no rashes.  Is been no leg swelling.  She has had no cough.  Overall, performance status is ECOG 1.     Medications:  Current Outpatient Medications:  .  acetaminophen (TYLENOL) 325 MG tablet, Take 325-650 mg by mouth every 6 (six) hours as needed (for pain)., Disp: , Rfl:  .  apixaban (ELIQUIS) 5 MG TABS tablet, Take 1 tablet (5 mg total) by mouth 2 (two) times daily., Disp: 180 tablet, Rfl: 1 .  cyclobenzaprine (FLEXERIL) 10 MG tablet, Take 2.5 mg by mouth daily as needed for muscle spasms., Disp: , Rfl:  .  diclofenac (VOLTAREN) 0.1 % ophthalmic solution, 4 (four) times daily., Disp: , Rfl:  .  ferrous gluconate (FERGON) 240 (27 FE) MG tablet, Take 27 mg by mouth daily., Disp: , Rfl:  .  fluorometholone (FML) 0.1 % ophthalmic suspension, Place 1 drop into both eyes every Monday, Wednesday, and Friday. , Disp: , Rfl:  .  fluticasone (FLONASE) 50 MCG/ACT nasal spray, Place 1 spray into both nostrils 2 (two) times daily., Disp: 16 g, Rfl: 5 .  glimepiride (AMARYL) 1 MG tablet, Take 1 mg by mouth daily., Disp: , Rfl:  .  glucose blood (FREESTYLE LITE) test strip, CHECK BLOOD SUGAR ONCE DAILY AS DIRECTED.  DX:790.29, Disp: 100 each,  Rfl: 12 .  hydrocortisone 1 % ointment, Apply 1 application topically 2 (two) times daily., Disp: 30 g, Rfl: 0 .  Lancets (FREESTYLE) lancets, Check blood sugar once daily as directed DX:790.29 (FREESTYLE FREEDOM LITE), Disp: 100 each, Rfl: 1 .  loratadine (CLARITIN) 10 MG tablet, Take 1 tablet (10 mg total) by mouth daily., Disp: 30 tablet, Rfl: 0 .  meclizine (ANTIVERT) 25 MG tablet, Take 25 mg by mouth as needed for dizziness., Disp: , Rfl:  .  metoprolol tartrate (LOPRESSOR) 50 MG tablet, TAKE 1 AND 1/2 TABLETS BY  MOUTH TWICE DAILY, Disp: 270 tablet, Rfl: 3 .  montelukast (SINGULAIR) 10 MG tablet, Take 1 tablet (10 mg total) by mouth at bedtime., Disp: 30 tablet, Rfl: 5 .  Multiple Vitamin (MULTIVITAMIN) tablet, Take 1 tablet by mouth daily. Shaklee brand, Disp: , Rfl:  .  nitroGLYCERIN (NITROSTAT) 0.4 MG SL tablet, Place 1 tablet (0.4 mg total) under the tongue every 5 (five) minutes as needed for chest pain., Disp: 25 tablet, Rfl: 3 .  pantoprazole (PROTONIX) 40 MG tablet, Take 40 mg by mouth 2 (two) times daily., Disp: , Rfl:  .  rosuvastatin (CRESTOR) 5 MG tablet, Take 5 mg by mouth See admin instructions. Take 5 mg by mouth at bedtime on Mondays and  Thursdays..Currently on hold, Disp: , Rfl:  .  traMADol (ULTRAM) 50 MG tablet, tramadol 50 mg tablet  Take 1 tablet every 6 hours by oral route as needed., Disp: , Rfl:  .  trolamine salicylate (ASPERCREME) 10 % cream, Apply 1 application topically as needed for muscle pain., Disp: , Rfl:  .  verapamil (CALAN-SR) 180 MG CR tablet, Take 180 mg by mouth at bedtime. , Disp: , Rfl:   Allergies:  Allergies  Allergen Reactions  . Codeine Hives and Other (See Comments)    Because of a history of documented adverse serious drug reaction, Medi Alert bracelet  is recommended  . Macrodantin [Nitrofurantoin Macrocrystal] Other (See Comments)    Numbness, aching all over  . Other Shortness Of Breath, Rash and Other (See Comments)    Pine nuts &  walnuts throat congestion. No documented angioedema. Avocado- Rash  . Propoxyphene Hives, Swelling and Rash  . Propoxyphene Hcl Hives, Swelling and Rash  . Latex Rash  . Banana Rash  . 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 Tizanidine Other (See Comments)    Reaction not recalled  . Tramadol Nausea Only    Past Medical History, Surgical history, Social history, and Family History were reviewed and updated.  Review of Systems: Review of Systems  Constitutional: Negative.   HENT:  Negative.   Eyes: Negative.   Respiratory: Negative.   Cardiovascular: Negative.   Gastrointestinal: Negative.   Endocrine: Negative.   Genitourinary: Negative.    Musculoskeletal: Negative.   Skin: Negative.   Neurological: Negative.   Hematological: Negative.   Psychiatric/Behavioral: Negative.     Physical Exam:  weight is 166 lb (75.3 kg). Her oral temperature is 98.8 F (37.1 C). Her blood pressure is 129/58 (abnormal) and her pulse is 85. Her respiration is 18 and oxygen saturation is 98%.   Wt Readings from Last 3 Encounters:  01/19/21 166 lb (75.3 kg)  01/11/21 171 lb (77.6 kg)  12/29/20 171 lb 6.4 oz (77.7 kg)    Physical Exam Vitals reviewed.  HENT:     Head: Normocephalic and atraumatic.  Eyes:     Pupils: Pupils are equal, round, and reactive to light.  Cardiovascular:     Rate and Rhythm: Normal rate and regular rhythm.     Heart sounds: Normal heart sounds.  Pulmonary:     Effort: Pulmonary effort is normal.     Breath sounds: Normal breath sounds.  Abdominal:     General: Bowel sounds are normal.     Palpations: Abdomen is soft.  Musculoskeletal:        General: No tenderness or deformity. Normal range of motion.     Cervical back: Normal range of motion.     Comments: There is moderate swelling of the left calf area.  This is somewhat tender to palpation.  There is no redness.  I  cannot palpate any obvious venous cord.  There is no pain behind the left knee.  She has decent pulses in her distal extremities.  Lymphadenopathy:     Cervical: No cervical adenopathy.  Skin:    General: Skin is warm and dry.     Findings: No erythema or rash.  Neurological:     Mental Status: She is alert and oriented to person, place, and time.  Psychiatric:        Behavior: Behavior normal.  Thought Content: Thought content normal.        Judgment: Judgment normal.      Lab Results  Component Value Date   WBC 2.4 (L) 01/19/2021   HGB 11.7 (L) 01/19/2021   HCT 37.0 01/19/2021   MCV 86.0 01/19/2021   PLT 227 01/19/2021     Chemistry      Component Value Date/Time   NA 138 01/19/2021 0830   NA 138 03/10/2020 1138   K 4.4 01/19/2021 0830   CL 100 01/19/2021 0830   CO2 31 01/19/2021 0830   BUN 16 01/19/2021 0830   BUN 10 03/10/2020 1138   CREATININE 0.72 01/19/2021 0830      Component Value Date/Time   CALCIUM 10.5 (H) 01/19/2021 0830   ALKPHOS 88 01/19/2021 0830   AST 14 (L) 01/19/2021 0830   ALT 13 01/19/2021 0830   BILITOT 0.6 01/19/2021 0830       Impression and Plan: Ms. Haapala is a 75 year old African-American female.   Looks like she does have a low-grade myelodysplasia.  Again she has a very low IPSS-R score.  We will see what her iron levels are.  I would like to think that they are better..  I do not think that she needs any Neulasta today.  We have yet to give her any ESA.  We certainly have this as an option if we needed to.   I would like to see her back right after the Easter holiday.    [name], MD 3/23/20229:53 AM

## 2021-01-21 ENCOUNTER — Encounter: Payer: Self-pay | Admitting: Physical Therapy

## 2021-01-21 ENCOUNTER — Ambulatory Visit: Payer: Medicare Other | Admitting: Physical Therapy

## 2021-01-21 ENCOUNTER — Other Ambulatory Visit: Payer: Self-pay

## 2021-01-21 DIAGNOSIS — R2681 Unsteadiness on feet: Secondary | ICD-10-CM

## 2021-01-21 DIAGNOSIS — H8111 Benign paroxysmal vertigo, right ear: Secondary | ICD-10-CM

## 2021-01-21 DIAGNOSIS — R42 Dizziness and giddiness: Secondary | ICD-10-CM

## 2021-01-21 NOTE — Therapy (Signed)
[facility] [address]  [place], [place], [address] Phone: [phone]   Fax:  [phone]  Physical Therapy Treatment  Patient Details  Name: Kristen Murray MRN: [id number] Date of Birth: [birth year] Referring Provider (PT): [name], MD   Progress Note Reporting Period 12/09/20 to 01/21/21  See note below for Objective Data and Assessment of Progress/Goals.     Encounter Date: 01/21/2021   PT End of Session - 01/21/21 0929    Visit Number 6    Number of Visits 12    Date for PT Re-Evaluation 03/04/21    Authorization Type UHC Medicare    PT Start Time 0851    PT Stop Time 0926    PT Time Calculation (min) 35 min    Equipment Utilized During Treatment Gait belt    Activity Tolerance Patient tolerated treatment well   limited by dizziness   Behavior During Therapy WFL for tasks assessed/performed           Past Medical History:  Diagnosis Date  . Arthritis   . Borderline abnormal TFTs    as a teen  . 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 Diabetes mellitus without complication ([place])   . E. coli UTI 10/05/12   [facility]; resistant only to Septra DS & tetracycline  . Eye infection   . Fibromyalgia   . GERD (gastroesophageal reflux disease)   . Headache(784.0)   . 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 HSV-1 infection   . HTN (hypertension)   . Hyperglycemia   . Hyperlipidemia   . Meningioma ([place])   . Myelodysplasia, low grade ([place]) 11/18/2020  . Nephrolithiasis    Dr Amalia Hailey, [facility], Hx of [3][  . Polyp of colon   . Radiculopathy     Past Surgical History:  Procedure Laterality Date  . ABDOMINAL HYSTERECTOMY     TAH/BSO --fibroids, no cancer  . BALLOON DILATION N/A 07/20/2014   Procedure: BALLOON DILATION;  Surgeon: Garlan Fair, MD;  Location: [facility];  Service: Endoscopy;  Laterality: N/A;  . BREAST BIOPSY     Right  . CARPAL TUNNEL RELEASE     &  trigger thumb RUE; Dr Alphonzo Cruise  . COLONOSCOPY W/ POLYPECTOMY  07/2004   Neg 2011; Dr Earle Gell  . CYSTOSCOPY  11/2009   Neg  . ESOPHAGOGASTRODUODENOSCOPY N/A 07/20/2014   Procedure: ESOPHAGOGASTRODUODENOSCOPY (EGD);  Surgeon: Garlan Fair, MD;  Location: [facility];  Service: Endoscopy;  Laterality: N/A;  . FLEXIBLE BRONCHOSCOPY W/ UPPER ENDOSCOPY     neg  . KNEE ARTHROSCOPY     Left  . ROTATOR CUFF REPAIR     R shoulder  . TONSILLECTOMY    . TRIGGER FINGER RELEASE Right    Right Thumb    There were no vitals filed for this visit.   Subjective Assessment - 01/21/21 0852    Subjective Has been "dizzy, dizz, dizzy." Feels like she is being pulled to the R. Feels like the dizziness has fluctuated. Notes very little remainign dizziness when laying down, but not most of her dizziness is when she is up and about. Current dizziness 5/10. Reports 30-40% improvement in dizziness since initial eval.    Pertinent History radiculopathy, HLD, hyperglycemia, HTN, HA, GERD, fibromyalgia, DM, R carpal tunnel release, L knee arthroscopy, R RTC repair    Diagnostic tests 12/01/20 head CT:  No acute intracranial pathology    Patient Stated Goals  decrease dizziness    Currently in Pain? No/denies              [facility] PT Assessment - 01/21/21 0001      Assessment   Medical Diagnosis Acute vertigo    Referring Provider (PT) [name], MD    Onset Date/Surgical Date 12/01/20               Vestibular Assessment - 01/21/21 0001      Dix-Hallpike Right   Dix-Hallpike Right Duration 0    Dix-Hallpike Right Symptoms No nystagmus   no dizziness     Sidelying Right   Sidelying Right Duration 0    Sidelying Right Symptoms No nystagmus   very mild dizziness upon laying down     Sidelying Left   Sidelying Left Duration 0    Sidelying Left Symptoms No nystagmus   very mild dizziness upon laying down     Horizontal Canal Right   Horizontal Canal Right Duration 0    Horizontal  Canal Right Symptoms Normal   no dizziness     Horizontal Canal Left   Horizontal Canal Left Duration 0    Horizontal Canal Left Symptoms Normal   no dizziness                    Vestibular Treatment/Exercise - 01/21/21 0001      Vestibular Treatment/Exercise   Habituation Exercises Legrand Como Daroff   Number of Reps  3    Symptom Description  2-2.5/10 dizziness with [facility]              Balance Exercises - 01/21/21 0001      Balance Exercises: Standing   Gait with Head Turns Forward;4 reps   2x each for 23ft head nods and head turns with CGA            PT Education - 01/21/21 0926    Education Details update to HEP; discussion on patient's remaining dizziness    Person(s) Educated Patient    Methods Explanation;Demonstration;Tactile cues;Verbal cues;Handout    Comprehension Verbalized understanding;Returned demonstration            PT Short Term Goals - 01/21/21 0853      PT SHORT TERM GOAL #1   Title Patient to report understanding and compliance with post-Epley precautions.    Time 2    Period Weeks    Status Achieved             PT Long Term Goals - 01/21/21 8502      PT LONG TERM GOAL #1   Title Patient to be independent with vestibular HEP as needed for dizziness.    Time 6    Period Weeks    Status Partially Met   met for current; admits to decreased compliance d/t family issues   Target Date 03/04/21      PT LONG TERM GOAL #2   Title Patient to demonstrate negative R [employer].    Time 6    Period Weeks    Status Achieved      PT LONG TERM GOAL #3   Title Patient to report no dizziness with bed mobility.    Time 6    Period Weeks    Status Partially Met   able to perform supine<>sit without dizziness today, but reporting mild dizziness remaining at home   Target Date 03/04/21      PT LONG TERM GOAL #4  Title Patient to report no dizziness and demonstrate no LOB when ambulating with SPC while performing  head turns.    Time 6    Period Weeks    Status Partially Met   no dizziness; very mild LOB to the R   Target Date 03/04/21                 Plan - 01/21/21 0930    Clinical Impression Statement Patient arrived to session with report of continued dizziness since last session. Admits to poor HEP adherence d/t having a death in the family. Does report 30-40% improvement in dizziness since initial eval. Remaining dizziness still occurs very slightly when laying down, more intense with up and about. R/L sidelying test revealed no nystagmus, however patient with report of very mild dizziness upon laying down to B sides. Remaining canal testing was also negative for nystagmus and dizziness. Gait with head turns revealed no dizziness but patient demonstrating mild imbalance to the R. Patient was able to perform bed mobility without dizziness today, but reporting mild dizziness remaining with bed mobility at home. Worked on increasing challenge with Augusto Garbe by taking away visual fixation- patient with report of mild dizziness; much improved since last session. Discussed with patient the importance of HEP compliance for max benefit with motion sensitivity. Patient reported understanding. Patient is demonstrating good improvement in dizziness. Would benefit from additional skilled PT services 1x/week-biweekly for 6 weeks to return to PLOF.    Comorbidities radiculopathy, HLD, hyperglycemia, HTN, HA, GERD, fibromyalgia, DM, R carpal tunnel release, L knee arthroscopy, R RTC repair    PT Frequency --   1x/week-biweekly for 6 weeks   PT Treatment/Interventions ADLs/Self Care Home Management;Canalith Repostioning;Moist Heat;Therapeutic activities;Functional mobility training;Stair training;Gait training;Therapeutic exercise;Balance training;Neuromuscular re-education;Patient/family education;Vestibular;Cryotherapy    PT Next Visit Plan continue habiutation & VOR    Consulted and Agree with Plan of Care  Patient           Patient will benefit from skilled therapeutic intervention in order to improve the following deficits and impairments:     Visit Diagnosis: BPPV (benign paroxysmal positional vertigo), right  Dizziness and giddiness  Unsteadiness on feet     Problem List Patient Active Problem List   Diagnosis Date Noted  . 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 Atrial flutter ([place]) 01/29/2020  . A-fib ([place]) 01/27/2020  . Atrial flutter, paroxysmal ([place]) 01/26/2020  . Iron deficiency anemia due to chronic blood loss 07/18/2019  . Pain in the chest   . Chest pain at rest 11/26/2014  . Shingles 05/14/2014  . 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 Vaginal atrophy 10/16/2012  . Menopause 10/16/2012  . Chest pain, atypical 02/19/2012  . 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 COSTOCHONDRITIS 11/16/2010  . DISTURBANCES OF SENSATION OF SMELL AND TASTE 04/05/2010  . NEPHROLITHIASIS, HX OF 02/08/2010  . EDEMA 10/01/2009  . Brachial neuritis or radiculitis NOS 06/14/2009  . EXTERNAL HEMORRHOIDS 05/25/2009  . MUSCLE PAIN 05/17/2009  . Hyperlipidemia 04/23/2009  . ABNORMAL THYROID FUNCTION TESTS 04/23/2009  . MENINGIOMA 12/26/2007  . HEADACHE 12/26/2007  . RADICULOPATHY 04/02/2007  . Essential hypertension 11/21/2006  . GERD 11/21/2006     Janene Harvey, PT, DPT 01/21/21 9:40 AM   [facility] [address]  [place], [place], [address] Phone: [phone]   Fax:  [phone]  Name: Kristen Murray MRN: [id number] Date of Birth:  07/02/1946   

## 2021-01-24 ENCOUNTER — Telehealth (HOSPITAL_COMMUNITY): Payer: Self-pay | Admitting: *Deleted

## 2021-01-24 NOTE — Telephone Encounter (Signed)
Reaching out to patient to offer assistance regarding upcoming cardiac imaging study; pt verbalizes understanding of appt date/time, parking situation and where to check in, pre-test NPO status and medications ordered, and verified current allergies; name and call back number provided for further questions should they arise    RN Navigator Cardiac Imaging [facility] [phone] office [phone] cell  

## 2021-01-25 ENCOUNTER — Encounter: Payer: Self-pay | Admitting: Physician Assistant

## 2021-01-25 ENCOUNTER — Other Ambulatory Visit: Payer: Self-pay

## 2021-01-25 ENCOUNTER — Ambulatory Visit (HOSPITAL_COMMUNITY)
Admission: RE | Admit: 2021-01-25 | Discharge: 2021-01-25 | Disposition: A | Discharge status: Home / Self Care | Payer: Medicare Other | Source: Ambulatory Visit | Attending: Physician Assistant | Admitting: Physician Assistant

## 2021-01-25 DIAGNOSIS — I10 Essential (primary) hypertension: Secondary | ICD-10-CM | POA: Diagnosis not present

## 2021-01-25 DIAGNOSIS — I700 Atherosclerosis of aorta: Secondary | ICD-10-CM | POA: Diagnosis not present

## 2021-01-25 DIAGNOSIS — I4892 Unspecified atrial flutter: Secondary | ICD-10-CM

## 2021-01-25 DIAGNOSIS — I2510 Atherosclerotic heart disease of native coronary artery without angina pectoris: Secondary | ICD-10-CM | POA: Insufficient documentation

## 2021-01-25 DIAGNOSIS — R079 Chest pain, unspecified: Secondary | ICD-10-CM | POA: Diagnosis not present

## 2021-01-25 MED ORDER — METOPROLOL TARTRATE 5 MG/5ML IV SOLN
INTRAVENOUS | Status: AC
  Administered 2021-01-25: 5 mg via INTRAVENOUS
  Filled 2021-01-25: qty 5

## 2021-01-25 MED ORDER — NITROGLYCERIN 0.4 MG SL SUBL
0.8000 mg | SUBLINGUAL_TABLET | Freq: Once | SUBLINGUAL | Status: AC
  Administered 2021-01-25: 0.8 mg via SUBLINGUAL

## 2021-01-25 MED ORDER — NITROGLYCERIN 0.4 MG SL SUBL
SUBLINGUAL_TABLET | SUBLINGUAL | Status: AC
  Filled 2021-01-25: qty 2

## 2021-01-25 MED ORDER — IOHEXOL 350 MG/ML SOLN
80.0000 mL | Freq: Once | INTRAVENOUS | Status: AC | PRN
  Administered 2021-01-25: 80 mL via INTRAVENOUS

## 2021-01-25 MED ORDER — METOPROLOL TARTRATE 5 MG/5ML IV SOLN: 5.0000 mg | INTRAVENOUS | Status: DC | PRN

## 2021-01-26 ENCOUNTER — Encounter: Payer: Self-pay | Admitting: *Deleted

## 2021-01-26 ENCOUNTER — Telehealth: Payer: Self-pay

## 2021-01-26 DIAGNOSIS — I4892 Unspecified atrial flutter: Secondary | ICD-10-CM

## 2021-01-26 NOTE — Telephone Encounter (Signed)
-----   Message from [name], [place] sent at 01/25/2021  5:21 PM EDT ----- Coronary CTA demonstrates minimal nonobstructive plaque.  Her chest discomfort is not due to a blockage.  Question if she may be having recurrent atrial fibrillation/flutter contributing to her symptoms. PLAN:  -Continue current medications -Arrange 30-day event monitor (atrial fibrillation/flutter) Richardson Dopp, PA-C    01/25/2021 5:15 PM

## 2021-01-26 NOTE — Progress Notes (Signed)
Patient ID: Kristen Murray, female   DOB: 08-14-1946, 75 y.o.   MRN: [id number] Patient enrolled for [employer] to ship a 30 day cardiac event monitor to her home.

## 2021-01-26 NOTE — Telephone Encounter (Signed)
Reviewed results with patient and answered all questions.  She is okay with me placing instructions for heart monitor on My Chart.  I briefly went over instructions.  Order placed for 30 day heart monitor.

## 2021-02-01 ENCOUNTER — Ambulatory Visit (INDEPENDENT_AMBULATORY_CARE_PROVIDER_SITE_OTHER): Payer: Medicare Other

## 2021-02-01 DIAGNOSIS — I4892 Unspecified atrial flutter: Secondary | ICD-10-CM

## 2021-02-02 ENCOUNTER — Ambulatory Visit: Payer: Medicare Other | Attending: Internal Medicine | Admitting: Physical Therapy

## 2021-02-02 ENCOUNTER — Encounter: Payer: Self-pay | Admitting: Physical Therapy

## 2021-02-02 ENCOUNTER — Other Ambulatory Visit: Payer: Self-pay

## 2021-02-02 DIAGNOSIS — H8111 Benign paroxysmal vertigo, right ear: Secondary | ICD-10-CM | POA: Insufficient documentation

## 2021-02-02 DIAGNOSIS — R2681 Unsteadiness on feet: Secondary | ICD-10-CM | POA: Diagnosis present

## 2021-02-02 DIAGNOSIS — R42 Dizziness and giddiness: Secondary | ICD-10-CM | POA: Insufficient documentation

## 2021-02-02 NOTE — Therapy (Addendum)
[facility] [address]  [place], [place], [address] Phone: [phone]   Fax:  [phone]  Physical Therapy Treatment  Patient Details  Name: Kristen Murray MRN: [id number] Date of Birth: 01-31-1946 Referring Provider (PT): [name], MD   Progress Note Reporting Period 12/09/20 to 02/02/21  See note below for Objective Data and Assessment of Progress/Goals.     Encounter Date: 02/02/2021   PT End of Session - 02/02/21 1526    Visit Number 7    Number of Visits 12    Date for PT Re-Evaluation 03/04/21    Authorization Type UHC Medicare    PT Start Time 4315    PT Stop Time 1523    PT Time Calculation (min) 50 min    Equipment Utilized During Treatment Gait belt    Activity Tolerance Patient tolerated treatment well   limited by dizziness   Behavior During Therapy WFL for tasks assessed/performed           Past Medical History:  Diagnosis Date  . Arthritis   . Borderline abnormal TFTs    as a teen  . CAD (coronary artery disease)    Coronary CTA 3/22: Calcium score 1 (38th percentile), minimal nonobstructive CAD with 0-24% in mid RCA; aortic atherosclerosis  . 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 Diabetes mellitus without complication ([place])   . E. coli UTI 10/05/12   [facility]; resistant only to Septra DS & tetracycline  . Eye infection   . Fibromyalgia   . GERD (gastroesophageal reflux disease)   . Headache(784.0)   . 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 HSV-1 infection   . HTN (hypertension)   . Hyperglycemia   . Hyperlipidemia   . Meningioma ([place])   . 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 Nephrolithiasis    Dr Amalia Hailey, [facility], Hx of [3][  . Polyp of colon   . Radiculopathy     Past Surgical History:  Procedure Laterality Date  . ABDOMINAL HYSTERECTOMY     TAH/BSO --fibroids, no cancer  . BALLOON DILATION N/A 07/20/2014   Procedure: BALLOON  DILATION;  Surgeon: Garlan Fair, MD;  Location: [facility];  Service: Endoscopy;  Laterality: N/A;  . BREAST BIOPSY     Right  . CARPAL TUNNEL RELEASE     & trigger thumb RUE; Dr Alphonzo Cruise  . COLONOSCOPY W/ POLYPECTOMY  07/2004   Neg 2011; Dr Earle Gell  . CYSTOSCOPY  11/2009   Neg  . ESOPHAGOGASTRODUODENOSCOPY N/A 07/20/2014   Procedure: ESOPHAGOGASTRODUODENOSCOPY (EGD);  Surgeon: Garlan Fair, MD;  Location: [facility];  Service: Endoscopy;  Laterality: N/A;  . FLEXIBLE BRONCHOSCOPY W/ UPPER ENDOSCOPY     neg  . KNEE ARTHROSCOPY     Left  . ROTATOR CUFF REPAIR     R shoulder  . TONSILLECTOMY    . TRIGGER FINGER RELEASE Right    Right Thumb    There were no vitals filed for this visit.   Subjective Assessment - 02/02/21 1431    Subjective Reports that she got her cardiac monitor yesterday. Notes that she was diagnosed with a-flutter and now trying to monitor for a-fib. Does not feel like she is being pulled to the R today, but was a bit dizzy upon standing yesterday. Has been able to work on her HEP and feels like it is getting a little better. Dizziness still occurs sometimes when she is laying  down.    Pertinent History radiculopathy, HLD, hyperglycemia, HTN, HA, GERD, fibromyalgia, DM, R carpal tunnel release, L knee arthroscopy, R RTC repair    Diagnostic tests 12/01/20 head CT:  No acute intracranial pathology    Patient Stated Goals decrease dizziness    Currently in Pain? No/denies                              Vestibular Treatment/Exercise - 02/02/21 0001      Vestibular Treatment/Exercise   Habituation Exercises Comment   R modified dix hallpike 3x c/o mild dizziness upon laying down     [employer]   Number of Reps  6    Symptom Description  3 reps with EO; 3 reps 2-3/10 dizziness with EC      X1 Viewing Horizontal   Foot Position standing on foam    Reps 10    Comments CGA d/t frequent posterior LOB      X1 Viewing  Vertical   Foot Position standing on foam    Reps 10    Comments CGA d/t frequent posterior LOB              Balance Exercises - 02/02/21 0001      Balance Exercises: Standing   Standing Eyes Opened Narrow base of support (BOS);30 secs    Standing Eyes Closed 1 rep;30 secs;Foam/compliant surface   severe sway with cueing to shift over toes   Step Ups Forward   R/L stepping forward/back on foam 10x each; more imbalance on R   Other Standing Exercises Comments marching on foam x1 min   cueing to avoid walkign forward on faom            PT Education - 02/02/21 1526    Education Details update to [employer]) Educated Patient    Methods Explanation;Demonstration;Tactile cues;Handout;Verbal cues    Comprehension Verbalized understanding;Returned demonstration            PT Short Term Goals - 01/21/21 0853      PT SHORT TERM GOAL #1   Title Patient to report understanding and compliance with post-Epley precautions.    Time 2    Period Weeks    Status Achieved             PT Long Term Goals - 01/21/21 0454      PT LONG TERM GOAL #1   Title Patient to be independent with vestibular HEP as needed for dizziness.    Time 6    Period Weeks    Status Partially Met   met for current; admits to decreased compliance d/t family issues   Target Date 03/04/21      PT LONG TERM GOAL #2   Title Patient to demonstrate negative R [employer].    Time 6    Period Weeks    Status Achieved      PT LONG TERM GOAL #3   Title Patient to report no dizziness with bed mobility.    Time 6    Period Weeks    Status Partially Met   able to perform supine<>sit without dizziness today, but reporting mild dizziness remaining at home   Target Date 03/04/21      PT LONG TERM GOAL #4   Title Patient to report no dizziness and demonstrate no LOB when ambulating with SPC while performing head turns.    Time 6  Period Weeks    Status Partially Met   no dizziness; very mild LOB to  the R   Target Date 03/04/21                 Plan - 02/02/21 1527    Clinical Impression Statement Patient arrived to session with report of receiving a cardiac monitor yesterday d/t concern over a-fib/a-flutter. Noting improvement in dizziness and R pulsion today as well as severity of dizziness with R Nestor Lewandowsky at home. Mild dizziness but no nystagmus evident with [place] today. Noting slightly increased dizziness with EC vs. EO but improvement in dizziness with subsequent reps. Initiated modified R DH, which patient reported mild dizziness with as well, with sit>supine position rather than supine>sit. Worked on static and dynamic balance challenges on compliant surfaces with patient demonstrating severe sway posteriorly, requiring intermittent mod A and cueing to shift weight anteriorly. Updated HEP with exercises that were safely performed today. Patient reported understanding and without complaints at end of session.    Comorbidities radiculopathy, HLD, hyperglycemia, HTN, HA, GERD, fibromyalgia, DM, R carpal tunnel release, L knee arthroscopy, R RTC repair    PT Frequency --   1x/week-biweekly for 6 weeks   PT Treatment/Interventions ADLs/Self Care Home Management;Canalith Repostioning;Moist Heat;Therapeutic activities;Functional mobility training;Stair training;Gait training;Therapeutic exercise;Balance training;Neuromuscular re-education;Patient/family education;Vestibular;Cryotherapy    PT Next Visit Plan continue habiutation & VOR    Consulted and Agree with Plan of Care Patient           Patient will benefit from skilled therapeutic intervention in order to improve the following deficits and impairments:     Visit Diagnosis: BPPV (benign paroxysmal positional vertigo), right  Dizziness and giddiness  Unsteadiness on feet     Problem List Patient Active Problem List   Diagnosis Date Noted  . CAD (coronary artery disease)   . Myelodysplasia, low grade  ([place]) 11/18/2020  . 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 Atrial flutter ([place]) 01/29/2020  . A-fib ([place]) 01/27/2020  . 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 Iron deficiency anemia due to chronic blood loss 07/18/2019  . Pain in the chest   . Chest pain at rest 11/26/2014  . Shingles 05/14/2014  . 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 Vaginal atrophy 10/16/2012  . Menopause 10/16/2012  . Chest pain, atypical 02/19/2012  . 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 COSTOCHONDRITIS 11/16/2010  . DISTURBANCES OF SENSATION OF SMELL AND TASTE 04/05/2010  . NEPHROLITHIASIS, HX OF 02/08/2010  . EDEMA 10/01/2009  . Brachial neuritis or radiculitis NOS 06/14/2009  . EXTERNAL HEMORRHOIDS 05/25/2009  . MUSCLE PAIN 05/17/2009  . Hyperlipidemia 04/23/2009  . ABNORMAL THYROID FUNCTION TESTS 04/23/2009  . MENINGIOMA 12/26/2007  . HEADACHE 12/26/2007  . RADICULOPATHY 04/02/2007  . Essential hypertension 11/21/2006  . GERD 11/21/2006     Janene Harvey, PT, DPT 02/02/21 3:31 PM   [facility] [address]  [place], [place], [address] Phone: [phone]   Fax:  [phone]  Name: Kristen Murray MRN: [id number] Date of Birth: 1945-11-30   PHYSICAL THERAPY DISCHARGE SUMMARY  Visits from Start of Care: 7  Current functional level related to goals / functional outcomes: Unable to assess; patient did not return   Remaining deficits: Unable to assess   Education / Equipment: HEP  Plan: Patient agrees to discharge.  Patient goals were partially met. Patient is being discharged due to not returning since the last visit.  ?????  Janene Harvey, PT, DPT 03/17/21 3:20 PM

## 2021-02-02 NOTE — Patient Instructions (Addendum)
   Also added in R modified epley 3-5x, twice a day

## 2021-02-13 NOTE — Progress Notes (Signed)
Cardiology Office Note:    Date:  02/14/2021   ID:  Kristen Murray, DOB Apr 11, 1946, MRN [id number]  PCP:  [name], MD  [facility] Cardiologist:  Mertie Moores, MD  [place] Electrophysiologist:  None   Referring MD: [name], MD   Chief Complaint  Patient presents with  . Hypertension  . Atrial Flutter    Previous notes.:    Kristen Murray is a 75 y.o. female with a hx of diabetes mellitus, reflux, hyperlipidemia, hypertension and recent diagnosis of atrial flutter.  I met her during the hospitalization in March, 2021 when she presented with atrial flutter. Echocardiogram at that time reveals normal left ventricular systolic function.  There was no significant valvular abnormalities.  Lexiscan Myoview study performed on January 30, 2020 reveals normal left ventricular systolic function with no evidence of ischemia.  Troponin levels were normal   Has had some palpitations Has rare episodes of jaw pan .   Is exercising in her house .   Has not gone outside much   February 14, 2021:  Seen for follow up of her atrial flutter  Has palpitations in the am ,  Is wearing an event monitor .  Does not sleep well at night.   Wonders if she has OSA .   She was having some jaw pain and ches pain  She had a coronary CT angiogram.  She has a coronary calcium score of 1 which places her in the 38th percentile for age and sex matched controls.  The left main is normal The left anterior descending artery is a large vessel with no plaque The left circumflex artery is a nondominant vessel that gives rise to a large OM1 in both the left circumflex and the obtuse marginal arteries are normal. The right coronary artery has very mild plaque in the mid right coronary artery (0 to 24% stenosis).  Past Medical History:  Diagnosis Date  . Arthritis   . Borderline abnormal TFTs    as a teen  . CAD (coronary artery disease)    Coronary CTA 3/22: Calcium score 1 (38th percentile), minimal  nonobstructive CAD with 0-24% in mid RCA; aortic atherosclerosis  . 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 Diabetes mellitus without complication ([place])   . E. coli UTI 10/05/12   [facility]; resistant only to Septra DS & tetracycline  . Eye infection   . Fibromyalgia   . GERD (gastroesophageal reflux disease)   . Headache(784.0)   . 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 HSV-1 infection   . HTN (hypertension)   . Hyperglycemia   . Hyperlipidemia   . Meningioma ([place])   . 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 Nephrolithiasis    Dr Amalia Hailey, [facility], Hx of [3][  . Polyp of colon   . Radiculopathy     Past Surgical History:  Procedure Laterality Date  . ABDOMINAL HYSTERECTOMY     TAH/BSO --fibroids, no cancer  . BALLOON DILATION N/A 07/20/2014   Procedure: BALLOON DILATION;  Surgeon: Garlan Fair, MD;  Location: [facility];  Service: Endoscopy;  Laterality: N/A;  . BREAST BIOPSY     Right  . CARPAL TUNNEL RELEASE     & trigger thumb RUE; Dr Alphonzo Cruise  . COLONOSCOPY W/ POLYPECTOMY  07/2004   Neg 2011; Dr Earle Gell  . CYSTOSCOPY  11/2009   Neg  . ESOPHAGOGASTRODUODENOSCOPY N/A 07/20/2014   Procedure: ESOPHAGOGASTRODUODENOSCOPY (EGD);  Surgeon: Garlan Fair,  MD;  Location: [facility];  Service: Endoscopy;  Laterality: N/A;  . FLEXIBLE BRONCHOSCOPY W/ UPPER ENDOSCOPY     neg  . KNEE ARTHROSCOPY     Left  . ROTATOR CUFF REPAIR     R shoulder  . TONSILLECTOMY    . TRIGGER FINGER RELEASE Right    Right Thumb    Current Medications: Current Meds  Medication Sig  . acetaminophen (TYLENOL) 325 MG tablet Take 325-650 mg by mouth every 6 (six) hours as needed (for pain).  Marland Kitchen apixaban (ELIQUIS) 5 MG TABS tablet Take 1 tablet (5 mg total) by mouth 2 (two) times daily.  . cyclobenzaprine (FLEXERIL) 10 MG tablet Take 2.5 mg by mouth daily as needed for muscle spasms.  . diclofenac (VOLTAREN) 0.1 % ophthalmic solution 4 (four) times daily.  .  ferrous gluconate (FERGON) 240 (27 FE) MG tablet Take 27 mg by mouth daily.  . fluorometholone (FML) 0.1 % ophthalmic suspension Place 1 drop into both eyes every Monday, Wednesday, and Friday.   . fluticasone (FLONASE) 50 MCG/ACT nasal spray Place 1 spray into both nostrils 2 (two) times daily.  Marland Kitchen glimepiride (AMARYL) 1 MG tablet Take 1 mg by mouth daily.  Marland Kitchen glucose blood (FREESTYLE LITE) test strip CHECK BLOOD SUGAR ONCE DAILY AS DIRECTED.  DX:790.29  . hydrocortisone 1 % ointment Apply 1 application topically 2 (two) times daily.  . Lancets (FREESTYLE) lancets Check blood sugar once daily as directed DX:790.29 (FREESTYLE FREEDOM LITE)  . loratadine (CLARITIN) 10 MG tablet Take 1 tablet (10 mg total) by mouth daily.  . meclizine (ANTIVERT) 25 MG tablet Take 25 mg by mouth as needed for dizziness.  . montelukast (SINGULAIR) 10 MG tablet Take 1 tablet (10 mg total) by mouth at bedtime.  . Multiple Vitamin (MULTIVITAMIN) tablet Take 1 tablet by mouth daily. Shaklee brand  . nitroGLYCERIN (NITROSTAT) 0.4 MG SL tablet Place 1 tablet (0.4 mg total) under the tongue every 5 (five) minutes as needed for chest pain.  . pantoprazole (PROTONIX) 40 MG tablet Take 40 mg by mouth 2 (two) times daily.  . rosuvastatin (CRESTOR) 5 MG tablet Take 5 mg by mouth See admin instructions. Take 5 mg by mouth at bedtime on Mondays and Thursdays..Currently on hold  . traMADol (ULTRAM) 50 MG tablet tramadol 50 mg tablet  Take 1 tablet every 6 hours by oral route as needed.  . trolamine salicylate (ASPERCREME) 10 % cream Apply 1 application topically as needed for muscle pain.  . verapamil (CALAN-SR) 180 MG CR tablet Take 180 mg by mouth at bedtime.   . [DISCONTINUED] metoprolol tartrate (LOPRESSOR) 50 MG tablet TAKE 1 AND 1/2 TABLETS BY  MOUTH TWICE DAILY     Allergies:   Codeine, Macrodantin [nitrofurantoin macrocrystal], Other, Propoxyphene, Propoxyphene hcl, Latex, Banana, Ciprofloxacin, Tizanidine, and Tramadol    Social History   Socioeconomic History  . Marital status: Married    Spouse name: Not on file  . Number of children: Not on file  . Years of education: Not on file  . Highest education level: Not on file  Occupational History  . Occupation: Estate manager/land agent: [employer]  Tobacco Use  . Smoking status: Never Smoker  . Smokeless tobacco: Never Used  Vaping Use  . Vaping Use: Never used  Substance and Sexual Activity  . Alcohol use: No    Alcohol/week: 0.0 standard drinks  . Drug use: No  . Sexual activity: Not Currently    Birth  control/protection: Post-menopausal    Comment: 1st intercourse- 20  partners - 1  Other Topics Concern  . Not on file  Social History Narrative   Regular Exercise-no         Social Determinants of Health   Financial Resource Strain: Not on file  Food Insecurity: Not on file  Transportation Needs: Not on file  Physical Activity: Not on file  Stress: Not on file  Social Connections: Not on file     Family History: The patient's family history includes Alcohol abuse in her mother; Diabetes in her maternal aunt, maternal grandmother, and maternal uncle; Other in her father; Tuberculosis in her maternal uncle. There is no history of Coronary artery disease.  ROS:   Please see the history of present illness.     All other systems reviewed and are negative.  EKGs/Labs/Other Studies Reviewed:    The following studies were reviewed today:   EKG:     Recent Labs: 01/19/2021: ALT 13; BUN 16; Creatinine 0.72; Hemoglobin 11.7; Platelet Count 227; Potassium 4.4; Sodium 138  Recent Lipid Panel    Component Value Date/Time   CHOL 164 01/27/2020 1835   TRIG 85 01/27/2020 1835   HDL 44 01/27/2020 1835   CHOLHDL 3.7 01/27/2020 1835   VLDL 17 01/27/2020 1835   LDLCALC 103 (H) 01/27/2020 1835    Physical Exam:    Physical Exam: Blood pressure 134/64, pulse 70, height 5' 4" (1.626 m), weight 169 lb 3.2 oz (76.7 kg), last  menstrual period 07/30/1990, SpO2 97 %.  GEN:  Well nourished, well developed in no acute distress HEENT: Normal NECK: No JVD; No carotid bruits LYMPHATICS: No lymphadenopathy CARDIAC: RRR , no murmurs, rubs, gallops RESPIRATORY:  Clear to auscultation without rales, wheezing or rhonchi  ABDOMEN: Soft, non-tender, non-distended MUSCULOSKELETAL:  No edema; No deformity  SKIN: Warm and dry NEUROLOGIC:  Alert and oriented x 3    ASSESSMENT:    No diagnosis found. PLAN:      Atrial Flutter:     :  No obvious recurrence   2.  Hypertension:   BP is well controlled.   3.  Hyperlipidemia:  Stable   4.  Chest pain :  Coronary CT angio showed minimal CAD .  Marland Kitchen  Her CP is not angina    Medication Adjustments/Labs and Tests Ordered: Current medicines are reviewed at length with the patient today.  Concerns regarding medicines are outlined above.  No orders of the defined types were placed in this encounter.  Meds ordered this encounter  Medications  . metoprolol tartrate (LOPRESSOR) 50 MG tablet    Sig: Take 1.5 tablets (75 mg total) by mouth 2 (two) times daily.    Dispense:  180 tablet    Refill:  3    Requesting 1 year supply    Patient Instructions  Medication Instructions:  Your physician recommends that you continue on your current medications as directed. Please refer to the Current Medication list given to you today.  *If you need a refill on your cardiac medications before your next appointment, please call your pharmacy*   Lab Work: none If you have labs (blood work) drawn today and your tests are completely normal, you will receive your results only by: Marland Kitchen MyChart Message (if you have MyChart) OR . A paper copy in the mail If you have any lab test that is abnormal or we need to change your treatment, we will call you to review the results.   Testing/Procedures:  none   Follow-Up: At [facility], you and your health needs are our priority.  As part of our  continuing mission to provide you with exceptional heart care, we have created designated Provider Care Teams.  These Care Teams include your primary Cardiologist (physician) and Advanced Practice Providers (APPs -  Physician Assistants and Nurse Practitioners) who all work together to provide you with the care you need, when you need it.   Your next appointment:   6 month(s)  The format for your next appointment:   In Person  Provider:   Richardson Dopp, PA-C       Signed, Mertie Moores, MD  02/14/2021 3:39 PM    [place]

## 2021-02-14 ENCOUNTER — Encounter: Payer: Self-pay | Admitting: Cardiovascular Disease

## 2021-02-14 ENCOUNTER — Other Ambulatory Visit: Payer: Self-pay

## 2021-02-14 ENCOUNTER — Ambulatory Visit: Payer: Medicare Other | Admitting: Cardiovascular Disease

## 2021-02-14 VITALS — BP 134/64 | HR 70 | Ht 64.0 in | Wt 169.2 lb

## 2021-02-14 DIAGNOSIS — I10 Essential (primary) hypertension: Secondary | ICD-10-CM | POA: Diagnosis not present

## 2021-02-14 DIAGNOSIS — I2510 Atherosclerotic heart disease of native coronary artery without angina pectoris: Secondary | ICD-10-CM

## 2021-02-14 MED ORDER — METOPROLOL TARTRATE 50 MG PO TABS: 75.0000 mg | ORAL_TABLET | Freq: Two times a day (BID) | ORAL | 3 refills | Status: DC

## 2021-02-14 MED ORDER — APIXABAN 5 MG PO TABS: 5.0000 mg | ORAL_TABLET | Freq: Two times a day (BID) | ORAL | 1 refills | Status: DC

## 2021-02-14 NOTE — Patient Instructions (Signed)
Medication Instructions:  Your physician recommends that you continue on your current medications as directed. Please refer to the Current Medication list given to you today.  *If you need a refill on your cardiac medications before your next appointment, please call your pharmacy*   Lab Work: none If you have labs (blood work) drawn today and your tests are completely normal, you will receive your results only by: Marland Kitchen MyChart Message (if you have MyChart) OR . A paper copy in the mail If you have any lab test that is abnormal or we need to change your treatment, we will call you to review the results.   Testing/Procedures: none   Follow-Up: At [facility], you and your health needs are our priority.  As part of our continuing mission to provide you with exceptional heart care, we have created designated Provider Care Teams.  These Care Teams include your primary Cardiologist (physician) and Advanced Practice Providers (APPs -  Physician Assistants and Nurse Practitioners) who all work together to provide you with the care you need, when you need it.   Your next appointment:   6 month(s)  The format for your next appointment:   In Person  Provider:   Richardson Dopp, PA-C

## 2021-02-14 NOTE — Telephone Encounter (Signed)
Eliquis 5mg  refill request received. Patient is 75 years old, weight-76.7kg, Crea-0.72 on 01/19/21, Diagnosis-Aflutter, and last seen by Dr. Acie Fredrickson today (02/14/21). Dose is appropriate based on dosing criteria. Will send in refill to requested pharmacy.

## 2021-02-18 ENCOUNTER — Inpatient Hospital Stay (HOSPITAL_BASED_OUTPATIENT_CLINIC_OR_DEPARTMENT_OTHER): Payer: Medicare Other | Admitting: Hematology & Oncology

## 2021-02-18 ENCOUNTER — Inpatient Hospital Stay: Payer: Medicare Other

## 2021-02-18 ENCOUNTER — Encounter: Payer: Self-pay | Admitting: Hematology & Oncology

## 2021-02-18 ENCOUNTER — Other Ambulatory Visit: Payer: Self-pay

## 2021-02-18 ENCOUNTER — Inpatient Hospital Stay: Payer: Medicare Other | Attending: Hematology & Oncology

## 2021-02-18 VITALS — BP 131/62 | HR 78 | Temp 98.6°F | Resp 16 | Wt 168.0 lb

## 2021-02-18 DIAGNOSIS — D4620 Refractory anemia with excess of blasts, unspecified: Secondary | ICD-10-CM

## 2021-02-18 DIAGNOSIS — D508 Other iron deficiency anemias: Secondary | ICD-10-CM | POA: Diagnosis not present

## 2021-02-18 DIAGNOSIS — K909 Intestinal malabsorption, unspecified: Secondary | ICD-10-CM | POA: Insufficient documentation

## 2021-02-18 DIAGNOSIS — D469 Myelodysplastic syndrome, unspecified: Secondary | ICD-10-CM | POA: Insufficient documentation

## 2021-02-18 LAB — CBC WITH DIFFERENTIAL (CANCER CENTER ONLY)
Abs Immature Granulocytes: 0 10*3/uL (ref 0.00–0.07)
Basophils Absolute: 0 10*3/uL (ref 0.0–0.1)
Basophils Relative: 0 %
Eosinophils Absolute: 0 10*3/uL (ref 0.0–0.5)
Eosinophils Relative: 1 %
HCT: 35.6 % — ABNORMAL LOW (ref 36.0–46.0)
Hemoglobin: 11.6 g/dL — ABNORMAL LOW (ref 12.0–15.0)
Immature Granulocytes: 0 %
Lymphocytes Relative: 70 %
Lymphs Abs: 1.6 10*3/uL (ref 0.7–4.0)
MCH: 27.3 pg (ref 26.0–34.0)
MCHC: 32.6 g/dL (ref 30.0–36.0)
MCV: 83.8 fL (ref 80.0–100.0)
Monocytes Absolute: 0.2 10*3/uL (ref 0.1–1.0)
Monocytes Relative: 10 %
Neutro Abs: 0.5 10*3/uL — ABNORMAL LOW (ref 1.7–7.7)
Neutrophils Relative %: 19 %
Platelet Count: 163 10*3/uL (ref 150–400)
RBC: 4.25 MIL/uL (ref 3.87–5.11)
RDW: 17 % — ABNORMAL HIGH (ref 11.5–15.5)
WBC Count: 2.4 10*3/uL — ABNORMAL LOW (ref 4.0–10.5)
nRBC: 0 % (ref 0.0–0.2)

## 2021-02-18 LAB — RETICULOCYTES
Immature Retic Fract: 6.1 % (ref 2.3–15.9)
RBC.: 4.25 MIL/uL (ref 3.87–5.11)
Retic Count, Absolute: 26.8 10*3/uL (ref 19.0–186.0)
Retic Ct Pct: 0.6 % (ref 0.4–3.1)

## 2021-02-18 LAB — CMP (CANCER CENTER ONLY)
ALT: 12 U/L (ref 0–44)
AST: 14 U/L — ABNORMAL LOW (ref 15–41)
Albumin: 3.8 g/dL (ref 3.5–5.0)
Alkaline Phosphatase: 86 U/L (ref 38–126)
Anion gap: 5 (ref 5–15)
BUN: 14 mg/dL (ref 8–23)
CO2: 32 mmol/L (ref 22–32)
Calcium: 10 mg/dL (ref 8.9–10.3)
Chloride: 101 mmol/L (ref 98–111)
Creatinine: 0.64 mg/dL (ref 0.44–1.00)
GFR, Estimated: >60 mL/min (ref >60)
Glucose, Bld: 147 mg/dL — ABNORMAL HIGH (ref 70–99)
Potassium: 3.8 mmol/L (ref 3.5–5.1)
Sodium: 138 mmol/L (ref 135–145)
Total Bilirubin: 0.3 mg/dL (ref 0.3–1.2)
Total Protein: 7.4 g/dL (ref 6.5–8.1)

## 2021-02-18 LAB — SAVE SMEAR(SSMR), FOR PROVIDER SLIDE REVIEW

## 2021-02-18 NOTE — Progress Notes (Signed)
Hematology and Oncology Follow Up Visit  Kristen Murray [id number] 05-26-1946 75 y.o. 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 is back for follow-up.  She is doing nicely.  She had a very nice Easter weekend.  So far, she is done well without having the use of any ESA.  Her erythropoietin level is on 31 so we could certainly use ESA if necessary.  Her last iron studies back in March.  She has had no bleeding.  She has had no cough or shortness of breath.  There has been no change in bowel or bladder habits.  She has had no rashes.  Her leg swelling seems to be doing better.  She does have some knee issues.  Currently, her performance status is ECOG 1.    Medications:  Current Outpatient Medications:  .  ipratropium (ATROVENT HFA) 17 MCG/ACT inhaler, Inhale 2 puffs into the lungs as needed., Disp: , Rfl:  .  acetaminophen (TYLENOL) 325 MG tablet, Take 325-650 mg by mouth every 6 (six) hours as needed (for pain)., Disp: , Rfl:  .  apixaban (ELIQUIS) 5 MG TABS tablet, Take 1 tablet (5 mg total) by mouth 2 (two) times daily., Disp: 180 tablet, Rfl: 1 .  cyclobenzaprine (FLEXERIL) 10 MG tablet, Take 2.5 mg by mouth daily as needed for muscle spasms., Disp: , Rfl:  .  diclofenac (VOLTAREN) 0.1 % ophthalmic solution, 4 (four) times daily., Disp: , Rfl:  .  ferrous gluconate (FERGON) 240 (27 FE) MG tablet, Take 27 mg by mouth daily., Disp: , Rfl:  .  fluorometholone (FML) 0.1 % ophthalmic suspension, Place 1 drop into both eyes every Monday, Wednesday, and Friday. , Disp: , Rfl:  .  fluticasone (FLONASE) 50 MCG/ACT nasal spray, Place 1 spray into both nostrils 2 (two) times daily., Disp: 16 g, Rfl: 5 .  glimepiride (AMARYL) 1 MG tablet, Take 1 mg by mouth daily.,  Disp: , Rfl:  .  glucose blood (FREESTYLE LITE) test strip, CHECK BLOOD SUGAR ONCE DAILY AS DIRECTED.  DX:790.29, Disp: 100 each, Rfl: 12 .  hydrocortisone 1 % ointment, Apply 1 application topically 2 (two) times daily., Disp: 30 g, Rfl: 0 .  Lancets (FREESTYLE) lancets, Check blood sugar once daily as directed DX:790.29 (FREESTYLE FREEDOM LITE), Disp: 100 each, Rfl: 1 .  loratadine (CLARITIN) 10 MG tablet, Take 1 tablet (10 mg total) by mouth daily., Disp: 30 tablet, Rfl: 0 .  meclizine (ANTIVERT) 25 MG tablet, Take 25 mg by mouth as needed for dizziness., Disp: , Rfl:  .  metoprolol tartrate (LOPRESSOR) 50 MG tablet, Take 1.5 tablets (75 mg total) by mouth 2 (two) times daily., Disp: 180 tablet, Rfl: 3 .  montelukast (SINGULAIR) 10 MG tablet, Take 1 tablet (10 mg total) by mouth at bedtime., Disp: 30 tablet, Rfl: 5 .  Multiple Vitamin (MULTIVITAMIN) tablet, Take 1 tablet by mouth daily. Shaklee brand, Disp: , Rfl:  .  nitroGLYCERIN (NITROSTAT) 0.4 MG SL tablet, Place 1 tablet (0.4 mg total) under the tongue every 5 (five) minutes as needed for chest pain., Disp: 25 tablet, Rfl: 3 .  pantoprazole (PROTONIX) 40 MG tablet, Take 40 mg by mouth 2 (two) times daily., Disp: , Rfl:  .  rosuvastatin (CRESTOR) 5 MG tablet, Take 5 mg by mouth See admin instructions. Take 5 mg by mouth at bedtime on Mondays and Thursdays..Currently on hold, Disp: , Rfl:  .  traMADol (ULTRAM) 50 MG tablet, tramadol 50 mg tablet  Take 1 tablet every 6 hours by oral route as needed., Disp: , Rfl:  .  trolamine salicylate (ASPERCREME) 10 % cream, Apply 1 application topically as needed for muscle pain., Disp: , Rfl:  .  verapamil (CALAN-SR) 180 MG CR tablet, Take 180 mg by mouth at bedtime. , Disp: , Rfl:   Allergies:  Allergies  Allergen Reactions  . Codeine Hives and Other (See Comments)    Because of a history of documented adverse serious drug reaction, Medi Alert bracelet  is recommended  . Macrodantin [Nitrofurantoin  Macrocrystal] Other (See Comments)    Numbness, aching all over  . Other Shortness Of Breath, Rash and Other (See Comments)    Pine nuts & walnuts throat congestion. No documented angioedema. Avocado- Rash  . Propoxyphene Hives, Swelling and Rash  . Propoxyphene Hcl Hives, Swelling and Rash  . Latex Rash  . Banana Rash  . 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 Tizanidine Other (See Comments)    Reaction not recalled  . Tramadol Nausea Only    Past Medical History, Surgical history, Social history, and Family History were reviewed and updated.  Review of Systems: Review of Systems  Constitutional: Negative.   HENT:  Negative.   Eyes: Negative.   Respiratory: Negative.   Cardiovascular: Negative.   Gastrointestinal: Negative.   Endocrine: Negative.   Genitourinary: Negative.    Musculoskeletal: Negative.   Skin: Negative.   Neurological: Negative.   Hematological: Negative.   Psychiatric/Behavioral: Negative.     Physical Exam:  weight is 168 lb (76.2 kg). Her oral temperature is 98.6 F (37 C). Her blood pressure is 131/62 and her pulse is 78. Her respiration is 16 and oxygen saturation is 99%.   Wt Readings from Last 3 Encounters:  02/18/21 168 lb (76.2 kg)  02/14/21 169 lb 3.2 oz (76.7 kg)  01/19/21 166 lb (75.3 kg)    Physical Exam Vitals reviewed.  HENT:     Head: Normocephalic and atraumatic.  Eyes:     Pupils: Pupils are equal, round, and reactive to light.  Cardiovascular:     Rate and Rhythm: Normal rate and regular rhythm.     Heart sounds: Normal heart sounds.  Pulmonary:     Effort: Pulmonary effort is normal.     Breath sounds: Normal breath sounds.  Abdominal:     General: Bowel sounds are normal.     Palpations: Abdomen is soft.  Musculoskeletal:        General: No tenderness or deformity. Normal range of motion.     Cervical back: Normal range of motion.      Comments: There is moderate swelling of the left calf area.  This is somewhat tender to palpation.  There is no redness.  I cannot palpate any obvious venous cord.  There is no pain behind the left knee.  She has decent pulses in her distal extremities.  Lymphadenopathy:     Cervical: No cervical adenopathy.  Skin:    General: Skin is warm and dry.     Findings: No erythema or rash.  Neurological:     Mental Status: She is alert and oriented to person,  place, and time.  Psychiatric:        Behavior: Behavior normal.        Thought Content: Thought content normal.        Judgment: Judgment normal.      Lab Results  Component Value Date   WBC 2.4 (L) 02/18/2021   HGB 11.6 (L) 02/18/2021   HCT 35.6 (L) 02/18/2021   MCV 83.8 02/18/2021   PLT 163 02/18/2021     Chemistry      Component Value Date/Time   NA 138 02/18/2021 1159   NA 138 03/10/2020 1138   K 3.8 02/18/2021 1159   CL 101 02/18/2021 1159   CO2 32 02/18/2021 1159   BUN 14 02/18/2021 1159   BUN 10 03/10/2020 1138   CREATININE 0.64 02/18/2021 1159      Component Value Date/Time   CALCIUM 10.0 02/18/2021 1159   ALKPHOS 86 02/18/2021 1159   AST 14 (L) 02/18/2021 1159   ALT 12 02/18/2021 1159   BILITOT 0.3 02/18/2021 1159       Impression and Plan: Ms. Leppanen is a 76 year old African-American female.   Looks like she does have a low-grade myelodysplasia.  Again she has a very low IPSS-R score.  We will see what her iron levels are.  I would like to think that they are better.    I do not think that she needs any Neulasta today.  We have yet to give her any ESA.  We certainly have this as an option if we needed to.   We will try to get her back now in about 4 or 5 weeks.  [name], MD 4/22/202212:51 PM

## 2021-02-21 LAB — IRON AND TIBC
Iron: 57 ug/dL (ref 41–142)
Saturation Ratios: 26 % (ref 21–57)
TIBC: 218 ug/dL — ABNORMAL LOW (ref 236–444)
UIBC: 160 ug/dL (ref 120–384)

## 2021-02-21 LAB — FERRITIN: Ferritin: 524 ng/mL — ABNORMAL HIGH (ref 11–307)

## 2021-03-07 ENCOUNTER — Encounter: Payer: Self-pay | Admitting: Obstetrics & Gynecology

## 2021-03-25 ENCOUNTER — Inpatient Hospital Stay: Payer: Medicare Other | Admitting: Hematology & Oncology

## 2021-03-25 ENCOUNTER — Encounter: Payer: Self-pay | Admitting: Hematology & Oncology

## 2021-03-25 ENCOUNTER — Other Ambulatory Visit: Payer: Self-pay

## 2021-03-25 ENCOUNTER — Inpatient Hospital Stay: Payer: Medicare Other | Attending: Hematology & Oncology

## 2021-03-25 VITALS — BP 130/64 | HR 69 | Temp 98.3°F | Resp 20 | Wt 169.8 lb

## 2021-03-25 DIAGNOSIS — D4620 Refractory anemia with excess of blasts, unspecified: Secondary | ICD-10-CM

## 2021-03-25 DIAGNOSIS — D46Z Other myelodysplastic syndromes: Secondary | ICD-10-CM

## 2021-03-25 DIAGNOSIS — K909 Intestinal malabsorption, unspecified: Secondary | ICD-10-CM | POA: Insufficient documentation

## 2021-03-25 DIAGNOSIS — D508 Other iron deficiency anemias: Secondary | ICD-10-CM | POA: Diagnosis not present

## 2021-03-25 DIAGNOSIS — D469 Myelodysplastic syndrome, unspecified: Secondary | ICD-10-CM | POA: Diagnosis present

## 2021-03-25 LAB — CBC WITH DIFFERENTIAL (CANCER CENTER ONLY)
Abs Immature Granulocytes: 0.01 10*3/uL (ref 0.00–0.07)
Basophils Absolute: 0 10*3/uL (ref 0.0–0.1)
Basophils Relative: 0 %
Eosinophils Absolute: 0 10*3/uL (ref 0.0–0.5)
Eosinophils Relative: 1 %
HCT: 35.4 % — ABNORMAL LOW (ref 36.0–46.0)
Hemoglobin: 11.5 g/dL — ABNORMAL LOW (ref 12.0–15.0)
Immature Granulocytes: 1 %
Lymphocytes Relative: 53 %
Lymphs Abs: 1 10*3/uL (ref 0.7–4.0)
MCH: 27.1 pg (ref 26.0–34.0)
MCHC: 32.5 g/dL (ref 30.0–36.0)
MCV: 83.3 fL (ref 80.0–100.0)
Monocytes Absolute: 0.2 10*3/uL (ref 0.1–1.0)
Monocytes Relative: 10 %
Neutro Abs: 0.6 10*3/uL — ABNORMAL LOW (ref 1.7–7.7)
Neutrophils Relative %: 35 %
Platelet Count: 170 10*3/uL (ref 150–400)
RBC: 4.25 MIL/uL (ref 3.87–5.11)
RDW: 17.1 % — ABNORMAL HIGH (ref 11.5–15.5)
WBC Count: 1.8 10*3/uL — ABNORMAL LOW (ref 4.0–10.5)
nRBC: 0 % (ref 0.0–0.2)

## 2021-03-25 LAB — CMP (CANCER CENTER ONLY)
ALT: 11 U/L (ref 0–44)
AST: 13 U/L — ABNORMAL LOW (ref 15–41)
Albumin: 4 g/dL (ref 3.5–5.0)
Alkaline Phosphatase: 77 U/L (ref 38–126)
Anion gap: 6 (ref 5–15)
BUN: 17 mg/dL (ref 8–23)
CO2: 30 mmol/L (ref 22–32)
Calcium: 10.4 mg/dL — ABNORMAL HIGH (ref 8.9–10.3)
Chloride: 100 mmol/L (ref 98–111)
Creatinine: 0.7 mg/dL (ref 0.44–1.00)
GFR, Estimated: >60 mL/min (ref >60)
Glucose, Bld: 187 mg/dL — ABNORMAL HIGH (ref 70–99)
Potassium: 3.9 mmol/L (ref 3.5–5.1)
Sodium: 136 mmol/L (ref 135–145)
Total Bilirubin: 0.4 mg/dL (ref 0.3–1.2)
Total Protein: 7.4 g/dL (ref 6.5–8.1)

## 2021-03-25 LAB — LACTATE DEHYDROGENASE: LDH: 179 U/L (ref 98–192)

## 2021-03-25 LAB — IRON AND TIBC
Iron: 60 ug/dL (ref 41–142)
Saturation Ratios: 25 % (ref 21–57)
TIBC: 239 ug/dL (ref 236–444)
UIBC: 179 ug/dL (ref 120–384)

## 2021-03-25 LAB — RETICULOCYTES
Immature Retic Fract: 9 % (ref 2.3–15.9)
RBC.: 4.25 MIL/uL (ref 3.87–5.11)
Retic Count, Absolute: 56.1 10*3/uL (ref 19.0–186.0)
Retic Ct Pct: 1.3 % (ref 0.4–3.1)

## 2021-03-25 LAB — FERRITIN: Ferritin: 632 ng/mL — ABNORMAL HIGH (ref 11–307)

## 2021-03-25 NOTE — Progress Notes (Signed)
Hematology and Oncology Follow Up Visit  Kristen Murray [id number] 03-09-1946 75 y.o. 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  She is doing nicely.  She had to get up quite early this morning.  There is bad weather outside so she was worried about losing her power.  She is little bit fatigued.  I suspect this might be from her white cells being a little on the low side.  Her ANC is 600.  I think that we may have to think about giving her a dose of Neulasta today.  She has had no problem with rashes.  There is no swollen lymph nodes.  She has had no cough or shortness of breath.  There is no nausea or vomiting.  She has had no change in bowel or bladder habits.  She has had no leg swelling.  We last saw her back in April, her ferritin was 524 with an iron saturation of 26%.  Currently, her performance status is ECOG 1.    Medications:  Current Outpatient Medications:  .  acetaminophen (TYLENOL) 325 MG tablet, Take 325-650 mg by mouth every 6 (six) hours as needed (for pain)., Disp: , Rfl:  .  apixaban (ELIQUIS) 5 MG TABS tablet, Take 1 tablet (5 mg total) by mouth 2 (two) times daily., Disp: 180 tablet, Rfl: 1 .  cyclobenzaprine (FLEXERIL) 10 MG tablet, Take 2.5 mg by mouth daily as needed for muscle spasms., Disp: , Rfl:  .  diclofenac (VOLTAREN) 0.1 % ophthalmic solution, 4 (four) times daily., Disp: , Rfl:  .  ferrous gluconate (FERGON) 240 (27 FE) MG tablet, Take 27 mg by mouth daily., Disp: , Rfl:  .  fluorometholone (FML) 0.1 % ophthalmic suspension, Place 1 drop into both eyes every Monday, Wednesday, and Friday. , Disp: , Rfl:  .  fluticasone (FLONASE) 50 MCG/ACT nasal spray, Place 1 spray into both nostrils 2 (two) times daily.,  Disp: 16 g, Rfl: 5 .  glipiZIDE (GLUCOTROL XL) 5 MG 24 hr tablet, Take 5 mg by mouth daily., Disp: , Rfl:  .  glucose blood (FREESTYLE LITE) test strip, CHECK BLOOD SUGAR ONCE DAILY AS DIRECTED.  DX:790.29, Disp: 100 each, Rfl: 12 .  hydrocortisone 1 % ointment, Apply 1 application topically 2 (two) times daily., Disp: 30 g, Rfl: 0 .  ipratropium (ATROVENT HFA) 17 MCG/ACT inhaler, Inhale 2 puffs into the lungs as needed., Disp: , Rfl:  .  Lancets (FREESTYLE) lancets, Check blood sugar once daily as directed DX:790.29 (FREESTYLE FREEDOM LITE), Disp: 100 each, Rfl: 1 .  metoprolol tartrate (LOPRESSOR) 50 MG tablet, Take 1.5 tablets (75 mg total) by mouth 2 (two) times daily., Disp: 180 tablet, Rfl: 3 .  montelukast (SINGULAIR) 10 MG tablet, Take 1 tablet (10 mg total) by mouth at bedtime., Disp: 30 tablet, Rfl: 5 .  Multiple Vitamin (MULTIVITAMIN) tablet, Take 1 tablet by mouth daily. Shaklee brand, Disp: , Rfl:  .  rosuvastatin (CRESTOR) 5 MG tablet, Take 5 mg by mouth See admin instructions. Take 5 mg by mouth at bedtime on Mondays and Thursdays..Currently on hold, Disp: , Rfl:  .  traMADol (ULTRAM) 50 MG tablet, Take 50 mg by mouth daily as needed.,  Disp: , Rfl:  .  trolamine salicylate (ASPERCREME) 10 % cream, Apply 1 application topically as needed for muscle pain., Disp: , Rfl:  .  verapamil (CALAN-SR) 180 MG CR tablet, Take 180 mg by mouth at bedtime. , Disp: , Rfl:  .  loratadine (CLARITIN) 10 MG tablet, Take 1 tablet (10 mg total) by mouth daily. (Patient not taking: Reported on 03/25/2021), Disp: 30 tablet, Rfl: 0 .  meclizine (ANTIVERT) 25 MG tablet, Take 25 mg by mouth as needed for dizziness. (Patient not taking: Reported on 03/25/2021), Disp: , Rfl:  .  nitroGLYCERIN (NITROSTAT) 0.4 MG SL tablet, Place 1 tablet (0.4 mg total) under the tongue every 5 (five) minutes as needed for chest pain. (Patient not taking: Reported on 03/25/2021), Disp: 25 tablet, Rfl: 3 .  pantoprazole (PROTONIX) 40 MG  tablet, Take 40 mg by mouth 2 (two) times daily. (Patient not taking: Reported on 03/25/2021), Disp: , Rfl:   Allergies:  Allergies  Allergen Reactions  . Codeine Hives and Other (See Comments)    Because of a history of documented adverse serious drug reaction, Medi Alert bracelet  is recommended  . Macrodantin [Nitrofurantoin Macrocrystal] Other (See Comments)    Numbness, aching all over  . Other Shortness Of Breath, Rash and Other (See Comments)    Pine nuts & walnuts throat congestion. No documented angioedema. Avocado- Rash  . Propoxyphene Hives, Swelling and Rash  . Propoxyphene Hcl Hives, Swelling and Rash  . Latex Rash  . Banana Rash  . 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 Tizanidine Other (See Comments)    Reaction not recalled  . Tramadol Nausea Only    Past Medical History, Surgical history, Social history, and Family History were reviewed and updated.  Review of Systems: Review of Systems  Constitutional: Negative.   HENT:  Negative.   Eyes: Negative.   Respiratory: Negative.   Cardiovascular: Negative.   Gastrointestinal: Negative.   Endocrine: Negative.   Genitourinary: Negative.    Musculoskeletal: Negative.   Skin: Negative.   Neurological: Negative.   Hematological: Negative.   Psychiatric/Behavioral: Negative.     Physical Exam:  weight is 169 lb 12.8 oz (77 kg). Her oral temperature is 98.3 F (36.8 C). Her blood pressure is 130/64 and her pulse is 69. Her respiration is 20 and oxygen saturation is 99%.   Wt Readings from Last 3 Encounters:  03/25/21 169 lb 12.8 oz (77 kg)  02/18/21 168 lb (76.2 kg)  02/14/21 169 lb 3.2 oz (76.7 kg)    Physical Exam Vitals reviewed.  HENT:     Head: Normocephalic and atraumatic.  Eyes:     Pupils: Pupils are equal, round, and reactive to light.  Cardiovascular:     Rate and Rhythm: Normal rate and regular rhythm.      Heart sounds: Normal heart sounds.  Pulmonary:     Effort: Pulmonary effort is normal.     Breath sounds: Normal breath sounds.  Abdominal:     General: Bowel sounds are normal.     Palpations: Abdomen is soft.  Musculoskeletal:        General: No tenderness or deformity. Normal range of motion.     Cervical back: Normal range of motion.     Comments: There is moderate swelling of the left calf area.  This is somewhat tender to palpation.  There is no redness.  I cannot palpate  any obvious venous cord.  There is no pain behind the left knee.  She has decent pulses in her distal extremities.  Lymphadenopathy:     Cervical: No cervical adenopathy.  Skin:    General: Skin is warm and dry.     Findings: No erythema or rash.  Neurological:     Mental Status: She is alert and oriented to person, place, and time.  Psychiatric:        Behavior: Behavior normal.        Thought Content: Thought content normal.        Judgment: Judgment normal.      Lab Results  Component Value Date   WBC 1.8 (L) 03/25/2021   HGB 11.5 (L) 03/25/2021   HCT 35.4 (L) 03/25/2021   MCV 83.3 03/25/2021   PLT 170 03/25/2021     Chemistry      Component Value Date/Time   NA 136 03/25/2021 0956   NA 138 03/10/2020 1138   K 3.9 03/25/2021 0956   CL 100 03/25/2021 0956   CO2 30 03/25/2021 0956   BUN 17 03/25/2021 0956   BUN 10 03/10/2020 1138   CREATININE 0.70 03/25/2021 0956      Component Value Date/Time   CALCIUM 10.4 (H) 03/25/2021 0956   ALKPHOS 77 03/25/2021 0956   AST 13 (L) 03/25/2021 0956   ALT 11 03/25/2021 0956   BILITOT 0.4 03/25/2021 0956       Impression and Plan: Ms. Lovecchio is a 75 year old African-American female.   Looks like she does have a low-grade myelodysplasia.  Again she has a very low IPSS-R score.  We will try her on Neulasta.  Hopefully this will help with her white cell count.  I want try to minimize her risk of infection.  When she does not need any Aranesp  today.  We are strictly dealing with quality of life issues.  I would like to make sure that we help her so she can enjoy her quality of life.  I will plan to get her back to see [place] in another month or so.    [name], MD 5/27/202211:01 AM

## 2021-04-22 ENCOUNTER — Telehealth: Payer: Self-pay | Admitting: *Deleted

## 2021-04-22 ENCOUNTER — Encounter: Payer: Self-pay | Admitting: *Deleted

## 2021-04-22 ENCOUNTER — Inpatient Hospital Stay: Payer: Medicare Other | Admitting: Hematology & Oncology

## 2021-04-22 ENCOUNTER — Inpatient Hospital Stay: Payer: Medicare Other

## 2021-04-22 ENCOUNTER — Inpatient Hospital Stay: Payer: Medicare Other | Attending: Hematology & Oncology

## 2021-04-22 ENCOUNTER — Other Ambulatory Visit: Payer: Self-pay

## 2021-04-22 ENCOUNTER — Encounter: Payer: Self-pay | Admitting: Hematology & Oncology

## 2021-04-22 VITALS — BP 141/63 | HR 79 | Temp 97.8°F | Resp 16 | Wt 167.0 lb

## 2021-04-22 DIAGNOSIS — D46Z Other myelodysplastic syndromes: Secondary | ICD-10-CM

## 2021-04-22 DIAGNOSIS — D509 Iron deficiency anemia, unspecified: Secondary | ICD-10-CM | POA: Insufficient documentation

## 2021-04-22 DIAGNOSIS — M1990 Unspecified osteoarthritis, unspecified site: Secondary | ICD-10-CM | POA: Diagnosis not present

## 2021-04-22 DIAGNOSIS — D4620 Refractory anemia with excess of blasts, unspecified: Secondary | ICD-10-CM | POA: Diagnosis not present

## 2021-04-22 DIAGNOSIS — D469 Myelodysplastic syndrome, unspecified: Secondary | ICD-10-CM | POA: Diagnosis present

## 2021-04-22 LAB — CBC WITH DIFFERENTIAL (CANCER CENTER ONLY)
Abs Immature Granulocytes: 0 10*3/uL (ref 0.00–0.07)
Basophils Absolute: 0 10*3/uL (ref 0.0–0.1)
Basophils Relative: 0 %
Eosinophils Absolute: 0 10*3/uL (ref 0.0–0.5)
Eosinophils Relative: 0 %
HCT: 36.1 % (ref 36.0–46.0)
Hemoglobin: 11.5 g/dL — ABNORMAL LOW (ref 12.0–15.0)
Immature Granulocytes: 0 %
Lymphocytes Relative: 67 %
Lymphs Abs: 1.6 10*3/uL (ref 0.7–4.0)
MCH: 27.4 pg (ref 26.0–34.0)
MCHC: 31.9 g/dL (ref 30.0–36.0)
MCV: 86.2 fL (ref 80.0–100.0)
Monocytes Absolute: 0.3 10*3/uL (ref 0.1–1.0)
Monocytes Relative: 13 %
Neutro Abs: 0.5 10*3/uL — ABNORMAL LOW (ref 1.7–7.7)
Neutrophils Relative %: 20 %
Platelet Count: 144 10*3/uL — ABNORMAL LOW (ref 150–400)
RBC: 4.19 MIL/uL (ref 3.87–5.11)
RDW: 18.1 % — ABNORMAL HIGH (ref 11.5–15.5)
WBC Count: 2.5 10*3/uL — ABNORMAL LOW (ref 4.0–10.5)
nRBC: 0 % (ref 0.0–0.2)

## 2021-04-22 LAB — CMP (CANCER CENTER ONLY)
ALT: 11 U/L (ref 0–44)
AST: 13 U/L — ABNORMAL LOW (ref 15–41)
Albumin: 4 g/dL (ref 3.5–5.0)
Alkaline Phosphatase: 68 U/L (ref 38–126)
Anion gap: 8 (ref 5–15)
BUN: 16 mg/dL (ref 8–23)
CO2: 28 mmol/L (ref 22–32)
Calcium: 10.1 mg/dL (ref 8.9–10.3)
Chloride: 102 mmol/L (ref 98–111)
Creatinine: 0.67 mg/dL (ref 0.44–1.00)
GFR, Estimated: >60 mL/min (ref >60)
Glucose, Bld: 139 mg/dL — ABNORMAL HIGH (ref 70–99)
Potassium: 4.1 mmol/L (ref 3.5–5.1)
Sodium: 138 mmol/L (ref 135–145)
Total Bilirubin: 0.5 mg/dL (ref 0.3–1.2)
Total Protein: 7.7 g/dL (ref 6.5–8.1)

## 2021-04-22 LAB — SAVE SMEAR(SSMR), FOR PROVIDER SLIDE REVIEW

## 2021-04-22 LAB — FERRITIN: Ferritin: 714 ng/mL — ABNORMAL HIGH (ref 11–307)

## 2021-04-22 LAB — IRON AND TIBC
Iron: 99 ug/dL (ref 41–142)
Saturation Ratios: 43 % (ref 21–57)
TIBC: 229 ug/dL — ABNORMAL LOW (ref 236–444)
UIBC: 130 ug/dL (ref 120–384)

## 2021-04-22 LAB — LACTATE DEHYDROGENASE: LDH: 175 U/L (ref 98–192)

## 2021-04-22 NOTE — Telephone Encounter (Signed)
Per 04/22/21 los - gave patient upcoming appointments - patient confirmed

## 2021-04-22 NOTE — Progress Notes (Signed)
Hematology and Oncology Follow Up Visit  Kristen Murray [id number] [birth year] 75 y.o. 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 is back for follow-up.  She seems to be managing pretty well.  She really has no specific complaints.  She does have tight knees.  She has arthritis in her knees.  She has had no problems with fever.  There is been no rashes.  She has had no issues with cough or shortness of breath.  Currently, I would have to say that her performance status is probably ECOG 1-2.     Medications:  Current Outpatient Medications:    chlorpheniramine-HYDROcodone (TUSSIONEX) 10-8 MG/5ML SUER, 5 ml as needed, Disp: , Rfl:    fluconazole (DIFLUCAN) 150 MG tablet, Take 150 mg by mouth., Disp: , Rfl:    acetaminophen (TYLENOL) 325 MG tablet, Take 325-650 mg by mouth every 6 (six) hours as needed (for pain)., Disp: , Rfl:    acyclovir (ZOVIRAX) 400 MG tablet, Take 400 mg by mouth 3 (three) times daily., Disp: , Rfl:    ALPRAZolam (XANAX) 0.25 MG tablet, Take 0.25 mg by mouth 2 (two) times daily as needed., Disp: , Rfl:    apixaban (ELIQUIS) 5 MG TABS tablet, Take 1 tablet (5 mg total) by mouth 2 (two) times daily., Disp: 180 tablet, Rfl: 1   ascorbic acid (VITAMIN C) 100 MG tablet, Take 100 mg by mouth daily., Disp: , Rfl:    b complex vitamins capsule, 1 tablet, Disp: , Rfl:    Cholecalciferol 25 MCG (1000 UT) capsule, Take 1,000 Units by mouth daily., Disp: , Rfl:    cyclobenzaprine (FLEXERIL) 10 MG tablet, Take 2.5 mg by mouth daily as needed for muscle spasms., Disp: , Rfl:    diclofenac (VOLTAREN) 0.1 % ophthalmic solution, 4 (four) times daily., Disp: , Rfl:    ferrous gluconate (FERGON) 240 (27 FE) MG tablet, Take 27 mg by mouth daily., Disp: , Rfl:     fluorometholone (FML) 0.1 % ophthalmic suspension, Place 1 drop into both eyes every Monday, Wednesday, and Friday. , Disp: , Rfl:    fluticasone (FLONASE) 50 MCG/ACT nasal spray, Place 1 spray into both nostrils 2 (two) times daily., Disp: 16 g, Rfl: 5   folic acid (FOLVITE) 976 MCG tablet, 1 tablet, Disp: , Rfl:    gabapentin (NEURONTIN) 300 MG capsule, Take 1 capsule by mouth daily., Disp: , Rfl:    glipiZIDE (GLUCOTROL XL) 5 MG 24 hr tablet, Take 5 mg by mouth daily., Disp: , Rfl:    glucose blood (FREESTYLE LITE) test strip, CHECK BLOOD SUGAR ONCE DAILY AS DIRECTED.  DX:790.29, Disp: 100 each, Rfl: 12   hydrocortisone 1 % ointment, Apply 1 application topically 2 (two) times daily., Disp: 30 g, Rfl: 0   ipratropium (ATROVENT HFA) 17 MCG/ACT inhaler, Inhale 2 puffs into the lungs as needed., Disp: , Rfl:    ipratropium (ATROVENT) 0.03 % nasal spray, 2 sprays in each nostril, Disp: , Rfl:    Lancets (FREESTYLE) lancets, Check blood sugar once daily as directed DX:790.29 (FREESTYLE FREEDOM LITE), Disp: 100 each, Rfl: 1   loratadine (CLARITIN) 10 MG tablet, Take 1 tablet (10 mg total) by mouth daily. (Patient not taking: Reported on 03/25/2021), Disp: 30 tablet, Rfl:  0   meclizine (ANTIVERT) 25 MG tablet, Take 25 mg by mouth as needed for dizziness. (Patient not taking: Reported on 03/25/2021), Disp: , Rfl:    metoprolol tartrate (LOPRESSOR) 50 MG tablet, Take 1.5 tablets (75 mg total) by mouth 2 (two) times daily., Disp: 180 tablet, Rfl: 3   montelukast (SINGULAIR) 10 MG tablet, Take 1 tablet (10 mg total) by mouth at bedtime., Disp: 30 tablet, Rfl: 5   Multiple Vitamin (MULTIVITAMIN) tablet, Take 1 tablet by mouth daily. Shaklee brand, Disp: , Rfl:    nitroGLYCERIN (NITROSTAT) 0.4 MG SL tablet, Place 1 tablet (0.4 mg total) under the tongue every 5 (five) minutes as needed for chest pain. (Patient not taking: Reported on 03/25/2021), Disp: 25 tablet, Rfl: 3   pantoprazole (PROTONIX) 40 MG tablet,  Take 40 mg by mouth 2 (two) times daily. (Patient not taking: Reported on 03/25/2021), Disp: , Rfl:    rosuvastatin (CRESTOR) 5 MG tablet, Take 5 mg by mouth See admin instructions. Take 5 mg by mouth at bedtime on Mondays and Thursdays..Currently on hold, Disp: , Rfl:    traMADol (ULTRAM) 50 MG tablet, Take 50 mg by mouth daily as needed., Disp: , Rfl:    trolamine salicylate (ASPERCREME) 10 % cream, Apply 1 application topically as needed for muscle pain., Disp: , Rfl:    verapamil (CALAN-SR) 180 MG CR tablet, Take 180 mg by mouth at bedtime. , Disp: , Rfl:   Allergies:  Allergies  Allergen Reactions   Codeine Hives and Other (See Comments)    Because of a history of documented adverse serious drug reaction, Medi Alert bracelet  is recommended   Macrodantin [Nitrofurantoin Macrocrystal] Other (See Comments)    Numbness, aching all over   Other Shortness Of Breath, Rash and Other (See Comments)    Pine nuts & walnuts throat congestion. No documented angioedema. 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  Review of Systems: Review of Systems  Constitutional: Negative.   HENT:  Negative.    Eyes: Negative.   Respiratory: Negative.    Cardiovascular: Negative.   Gastrointestinal: Negative.   Endocrine: Negative.   Genitourinary: Negative.    Musculoskeletal: Negative.   Skin: Negative.   Neurological: Negative.   Hematological: Negative.   Psychiatric/Behavioral: Negative.     Physical Exam:  weight is 167 lb (75.8 kg). Her oral temperature is 97.8 F (36.6 C). Her blood pressure is 141/63  (abnormal) and her pulse is 79. Her respiration is 16 and oxygen saturation is 98%.   Wt Readings from Last 3 Encounters:  04/22/21 167 lb (75.8 kg)  03/25/21 169 lb 12.8 oz (77 kg)  02/18/21 168 lb (76.2 kg)    Physical Exam Vitals reviewed.  HENT:     Head: Normocephalic and atraumatic.  Eyes:     Pupils: Pupils are equal, round, and reactive to light.  Cardiovascular:     Rate and Rhythm: Normal rate and regular rhythm.     Heart sounds: Normal heart sounds.  Pulmonary:     Effort: Pulmonary effort is normal.  Breath sounds: Normal breath sounds.  Abdominal:     General: Bowel sounds are normal.     Palpations: Abdomen is soft.  Musculoskeletal:        General: No tenderness or deformity. Normal range of motion.     Cervical back: Normal range of motion.     Comments: There is moderate swelling of the left calf area.  This is somewhat tender to palpation.  There is no redness.  I cannot palpate any obvious venous cord.  There is no pain behind the left knee.  She has decent pulses in her distal extremities.  Lymphadenopathy:     Cervical: No cervical adenopathy.  Skin:    General: Skin is warm and dry.     Findings: No erythema or rash.  Neurological:     Mental Status: She is alert and oriented to person, place, and time.  Psychiatric:        Behavior: Behavior normal.        Thought Content: Thought content normal.        Judgment: Judgment normal.     Lab Results  Component Value Date   WBC 2.5 (L) 04/22/2021   HGB 11.5 (L) 04/22/2021   HCT 36.1 04/22/2021   MCV 86.2 04/22/2021   PLT 144 (L) 04/22/2021     Chemistry      Component Value Date/Time   NA 138 04/22/2021 0946   NA 138 03/10/2020 1138   K 4.1 04/22/2021 0946   CL 102 04/22/2021 0946   CO2 28 04/22/2021 0946   BUN 16 04/22/2021 0946   BUN 10 03/10/2020 1138   CREATININE 0.67 04/22/2021 0946      Component Value Date/Time   CALCIUM 10.1 04/22/2021 0946   ALKPHOS 68 04/22/2021 0946    AST 13 (L) 04/22/2021 0946   ALT 11 04/22/2021 0946   BILITOT 0.5 04/22/2021 0946       Impression and Plan: Ms. Harjo is a 75 year old African-American female.   Looks like she does have a low-grade myelodysplasia.  Again she has a very low IPSS-R score.  At this point, we will just follow her along.  She has a better white cell count.  I think she will always have a low white cell count.  Thankfully, I do not think she has had issues with respect to infections.  She does not need any Aranesp.  We will see what her iron levels are.  I think we can try to move her appointment out now to 6 weeks.    [name], MD 6/24/202211:05 AM

## 2021-05-24 ENCOUNTER — Encounter: Payer: Self-pay | Admitting: Allergy and Immunology

## 2021-05-24 ENCOUNTER — Ambulatory Visit (INDEPENDENT_AMBULATORY_CARE_PROVIDER_SITE_OTHER): Payer: Medicare Other | Admitting: Allergy and Immunology

## 2021-05-24 ENCOUNTER — Other Ambulatory Visit: Payer: Self-pay

## 2021-05-24 VITALS — BP 126/62 | HR 68 | Temp 97.9°F | Resp 18 | Ht 65.0 in | Wt 176.0 lb

## 2021-05-24 DIAGNOSIS — J3089 Other allergic rhinitis: Secondary | ICD-10-CM | POA: Diagnosis not present

## 2021-05-24 DIAGNOSIS — K219 Gastro-esophageal reflux disease without esophagitis: Secondary | ICD-10-CM

## 2021-05-24 DIAGNOSIS — T7805XD Anaphylactic reaction due to tree nuts and seeds, subsequent encounter: Secondary | ICD-10-CM

## 2021-05-24 MED ORDER — FLUTICASONE PROPIONATE 50 MCG/ACT NA SUSP: 1.0000 | Freq: Two times a day (BID) | NASAL | 11 refills | Status: DC

## 2021-05-24 MED ORDER — MONTELUKAST SODIUM 10 MG PO TABS
10.0000 mg | ORAL_TABLET | Freq: Every day | ORAL | 11 refills | Status: DC
Start: 2021-05-24 — End: 2021-12-22

## 2021-05-24 NOTE — Progress Notes (Signed)
[place]   Follow-up Note  Referring Provider: [name], MD Primary Provider: [name], MD Date of Office Visit: 05/24/2021  Subjective:   Kristen Murray (DOB: 1946/01/26) is a 75 y.o. female who returns to the [facility] on 05/24/2021 in re-evaluation of the following:  HPI: Valkyrie returns to this clinic in evaluation of allergic rhinitis and LPR and food allergy directed against tree nuts, pine nuts, banana, avocado.  Her last visit to this clinic was 04 May 2020.  She is done very well with her upper airway and has not required a systemic steroid or an antibiotic to treat any type of airway issue.  She is very pleased with the response that she is receiving at this point in time while using montelukast and Flonase.  She uses nasal ipratropium as well when her nose is runny.  Her LPR is under very good control as is her classic reflux symptoms while utilizing pantoprazole and being very careful about consolidating caffeine and chocolate consumption and replacing her throat clearing with swallowing maneuver.  She does not consume tree nuts or pine nuts or banana or avocado.  She recently developed a stiff neck syndrome over the course of the past 7 days without any obvious precipitant which is slowly improving.  Allergies as of 05/24/2021       Reactions   Codeine Hives, Other (See Comments)   Because of a history of documented adverse serious drug reaction, Medi Alert bracelet  is recommended   Macrodantin [nitrofurantoin Macrocrystal] Other (See Comments)   Numbness, aching all over   Other Shortness Of Breath, Rash, Other (See Comments)   Pine nuts & walnuts throat congestion. No documented angioedema. Avocado- Rash   Propoxyphene Hives, Swelling, Rash   Propoxyphene Hcl Hives, Swelling, Rash   Latex Rash   Banana Rash   Ciprofloxacin Nausea Only, Other (See Comments)   Nausea (Note: Patient takes this  when on mission trips, however)   Tizanidine Other (See Comments)   Reaction not recalled   Tramadol Nausea Only        Medication List    acetaminophen 325 MG tablet Commonly known as: TYLENOL Take 325-650 mg by mouth every 6 (six) hours as needed (for pain).   acyclovir 400 MG tablet Commonly known as: ZOVIRAX Take 400 mg by mouth 3 (three) times daily.   ALPRAZolam 0.25 MG tablet Commonly known as: XANAX Take 0.25 mg by mouth 2 (two) times daily as needed.   apixaban 5 MG Tabs tablet Commonly known as: ELIQUIS Take 1 tablet (5 mg total) by mouth 2 (two) times daily.   ascorbic acid 100 MG tablet Commonly known as: VITAMIN C Take 100 mg by mouth daily.   b complex vitamins capsule 1 tablet   chlorpheniramine-HYDROcodone 10-8 MG/5ML Suer Commonly known as: TUSSIONEX 5 ml as needed   Cholecalciferol 25 MCG (1000 UT) capsule Take 1,000 Units by mouth daily.   cyclobenzaprine 10 MG tablet Commonly known as: FLEXERIL Take 2.5 mg by mouth daily as needed for muscle spasms.   diclofenac 0.1 % ophthalmic solution Commonly known as: VOLTAREN 4 (four) times daily.   ferrous gluconate 240 (27 FE) MG tablet Commonly known as: FERGON Take 27 mg by mouth daily.   fluconazole 150 MG tablet Commonly known as: DIFLUCAN Take 150 mg by mouth.   fluorometholone 0.1 % ophthalmic suspension Commonly known as: FML Place 1 drop into both eyes every  Monday, Wednesday, and Friday.   fluticasone 50 MCG/ACT nasal spray Commonly known as: FLONASE Place 1 spray into both nostrils 2 (two) times daily.   folic acid A999333 MCG tablet Commonly known as: FOLVITE 1 tablet   freestyle lancets Check blood sugar once daily as directed DX:790.29 (FREESTYLE FREEDOM LITE)   gabapentin 300 MG capsule Commonly known as: NEURONTIN Take 1 capsule by mouth daily.   glipiZIDE 5 MG 24 hr tablet Commonly known as: GLUCOTROL XL Take 5 mg by mouth daily.   glucose blood test  strip Commonly known as: FREESTYLE LITE CHECK BLOOD SUGAR ONCE DAILY AS DIRECTED.  DX:790.29   hydrocortisone 1 % ointment Apply 1 application topically 2 (two) times daily.   ipratropium 0.03 % nasal spray Commonly known as: ATROVENT 2 sprays in each nostril   ipratropium 17 MCG/ACT inhaler Commonly known as: ATROVENT HFA Inhale 2 puffs into the lungs as needed.   loratadine 10 MG tablet Commonly known as: CLARITIN Take 1 tablet (10 mg total) by mouth daily.   metoprolol tartrate 50 MG tablet Commonly known as: LOPRESSOR Take 1.5 tablets (75 mg total) by mouth 2 (two) times daily.   montelukast 10 MG tablet Commonly known as: SINGULAIR Take 1 tablet (10 mg total) by mouth at bedtime.   multivitamin tablet Take 1 tablet by mouth daily. Shaklee brand   nitroGLYCERIN 0.4 MG SL tablet Commonly known as: NITROSTAT Place 1 tablet (0.4 mg total) under the tongue every 5 (five) minutes as needed for chest pain.   pantoprazole 40 MG tablet Commonly known as: PROTONIX Take 40 mg by mouth 2 (two) times daily.   rosuvastatin 5 MG tablet Commonly known as: CRESTOR Take 5 mg by mouth See admin instructions. Take 5 mg by mouth at bedtime on Mondays and Thursdays..Currently on hold   traMADol 50 MG tablet Commonly known as: ULTRAM Take 50 mg by mouth daily as needed.   trolamine salicylate 10 % cream Commonly known as: ASPERCREME Apply 1 application topically as needed for muscle pain.   verapamil 180 MG CR tablet Commonly known as: CALAN-SR Take 180 mg by mouth at bedtime.        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    HTN (hypertension)    Hyperglycemia    Hyperlipidemia    Meningioma (HCC)    Myelodysplasia, low grade ([place]) 11/18/2020   Nephrolithiasis    Dr Amalia Hailey, [facility], Hx of [3][   Polyp of colon    Radiculopathy     Past Surgical History:  Procedure Laterality Date   ABDOMINAL HYSTERECTOMY     TAH/BSO --fibroids, no cancer   BALLOON DILATION N/A 07/20/2014   Procedure: BALLOON DILATION;  Surgeon: Garlan Fair, MD;  Location: [facility];  Service: Endoscopy;  Laterality: N/A;   BREAST BIOPSY     Right   CARPAL TUNNEL  RELEASE     & trigger thumb RUE; Dr Alphonzo Cruise   COLONOSCOPY W/ POLYPECTOMY  07/2004   Neg 2011; Dr Earle Gell   CYSTOSCOPY  11/2009   Neg   ESOPHAGOGASTRODUODENOSCOPY N/A 07/20/2014   Procedure: ESOPHAGOGASTRODUODENOSCOPY (EGD);  Surgeon: Garlan Fair, MD;  Location: [facility];  Service: Endoscopy;  Laterality: N/A;   FLEXIBLE BRONCHOSCOPY W/ UPPER ENDOSCOPY     neg   KNEE ARTHROSCOPY     Left   ROTATOR CUFF REPAIR     R shoulder   TONSILLECTOMY     TRIGGER FINGER RELEASE Right    Right Thumb    Review of systems negative except as noted in HPI / PMHx or noted below:  Review of Systems  Constitutional: Negative.   HENT: Negative.    Eyes: Negative.   Respiratory: Negative.    Cardiovascular: Negative.   Gastrointestinal: Negative.   Genitourinary: Negative.   Musculoskeletal: Negative.   Skin: Negative.   Neurological: Negative.   Endo/Heme/Allergies: Negative.   Psychiatric/Behavioral: Negative.      Objective:   Vitals:   05/24/21 1042  BP: 126/62  Pulse: 68  Resp: 18  Temp: 97.9 F (36.6 C)  SpO2: 97%   Height: '5\' 5"'$  (165.1 cm)  Weight: 176 lb (79.8 kg)   Physical Exam Constitutional:      Appearance: She is not  diaphoretic.  HENT:     Head: Normocephalic.     Right Ear: Tympanic membrane, ear canal and external ear normal.     Left Ear: Tympanic membrane, ear canal and external ear normal.     Nose: Nose normal. No mucosal edema or rhinorrhea.     Mouth/Throat:     Pharynx: Uvula midline. No oropharyngeal exudate.  Eyes:     Conjunctiva/sclera: Conjunctivae normal.  Neck:     Thyroid: No thyromegaly.     Trachea: Trachea normal. No tracheal tenderness or tracheal deviation.  Cardiovascular:     Rate and Rhythm: Normal rate and regular rhythm.     Heart sounds: Normal heart sounds, S1 normal and S2 normal. No murmur heard. Pulmonary:     Effort: No respiratory distress.     Breath sounds: Normal breath sounds. No stridor. No wheezing or rales.  Lymphadenopathy:     Head:     Right side of head: No tonsillar adenopathy.     Left side of head: No tonsillar adenopathy.     Cervical: No cervical adenopathy.  Skin:    Findings: No erythema or rash.     Nails: There is no clubbing.  Neurological:     Mental Status: She is alert.    Diagnostics: none  Assessment and Plan:   1. Other allergic rhinitis   2. LPRD (laryngopharyngeal reflux disease)   3. 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 Continue avoidance - tree nuts, pine nuts, banana, avocado  2. Continue to Treat and prevent inflammation:   A. Flonase 1-2 sprays each nostril one time per day  B. montelukast 10 mg tablet once a day  3. Continue to Treat LPR:   A. Continue to consolidate all caffeine and chocolate use  B. Continue to replace throat clearing with swallowing maneuver  C. Continue Pantoprazole 40 mg - 1 tablet 1 time per day  4. If needed:   A. nasal saline  B. OTC antihistamine - Cetirizine (Zyrtec)  C. Epi-Pen  D. Can add OTC Mucinex two times per day  E.  Ipratropium 0.06% 1-2 sprays each nostril every 6 hours to dry nose  5. Obtain fall flu vaccine  6. Return to  clinic in 1 year or earlier if problem  Jealousy appears to be doing very well at this point in time regarding her airway issue which is a combination of atopic disease and LPR on her current plan.  Assuming she does well with this plan and also does well while performing avoidance measures directed against specific food products we will see her back in this clinic in 1 year or earlier if there is a problem.   Allena Katz, MD Allergy / Immunology [place]

## 2021-05-24 NOTE — Patient Instructions (Signed)
  1. Continue avoidance - tree nuts, pine nuts, banana, avocado  2. Continue to Treat and prevent inflammation:   A. Flonase 1-2 sprays each nostril one time per day  B. montelukast 10 mg tablet once a day  3. Continue to Treat LPR:   A. Continue to consolidate all caffeine and chocolate use  B. Continue to replace throat clearing with swallowing maneuver  C. Continue Pantoprazole 40 mg - 1 tablet 1 time per day  4. If needed:   A. nasal saline  B. OTC antihistamine - Cetirizine (Zyrtec)  C. Epi-Pen  D. Can add OTC Mucinex two times per day  E.  Ipratropium 0.06% 1-2 sprays each nostril every 6 hours to dry nose  5. Obtain fall flu vaccine  6. Return to clinic in 1 year or earlier if problem

## 2021-05-25 ENCOUNTER — Encounter (HOSPITAL_BASED_OUTPATIENT_CLINIC_OR_DEPARTMENT_OTHER): Payer: Self-pay | Admitting: Emergency Medicine

## 2021-05-25 ENCOUNTER — Encounter: Payer: Self-pay | Admitting: Allergy and Immunology

## 2021-05-25 ENCOUNTER — Emergency Department (HOSPITAL_BASED_OUTPATIENT_CLINIC_OR_DEPARTMENT_OTHER)
Admission: EM | Admit: 2021-05-25 | Discharge: 2021-05-25 | Disposition: A | Discharge status: Home / Self Care | Payer: Medicare Other | Attending: Emergency Medicine | Admitting: Emergency Medicine

## 2021-05-25 ENCOUNTER — Emergency Department (HOSPITAL_BASED_OUTPATIENT_CLINIC_OR_DEPARTMENT_OTHER): Payer: Medicare Other

## 2021-05-25 DIAGNOSIS — I2510 Atherosclerotic heart disease of native coronary artery without angina pectoris: Secondary | ICD-10-CM | POA: Diagnosis not present

## 2021-05-25 DIAGNOSIS — R079 Chest pain, unspecified: Secondary | ICD-10-CM | POA: Diagnosis present

## 2021-05-25 DIAGNOSIS — I10 Essential (primary) hypertension: Secondary | ICD-10-CM | POA: Insufficient documentation

## 2021-05-25 DIAGNOSIS — Z91040 Latex allergy status: Secondary | ICD-10-CM | POA: Diagnosis not present

## 2021-05-25 DIAGNOSIS — I4891 Unspecified atrial fibrillation: Secondary | ICD-10-CM | POA: Insufficient documentation

## 2021-05-25 DIAGNOSIS — R1010 Upper abdominal pain, unspecified: Secondary | ICD-10-CM | POA: Diagnosis not present

## 2021-05-25 DIAGNOSIS — E1149 Type 2 diabetes mellitus with other diabetic neurological complication: Secondary | ICD-10-CM | POA: Insufficient documentation

## 2021-05-25 DIAGNOSIS — R0789 Other chest pain: Secondary | ICD-10-CM

## 2021-05-25 DIAGNOSIS — Z7984 Long term (current) use of oral hypoglycemic drugs: Secondary | ICD-10-CM | POA: Insufficient documentation

## 2021-05-25 DIAGNOSIS — Z79899 Other long term (current) drug therapy: Secondary | ICD-10-CM | POA: Insufficient documentation

## 2021-05-25 DIAGNOSIS — Z7901 Long term (current) use of anticoagulants: Secondary | ICD-10-CM | POA: Insufficient documentation

## 2021-05-25 LAB — CBC WITH DIFFERENTIAL/PLATELET
Abs Immature Granulocytes: 0.03 10*3/uL (ref 0.00–0.07)
Basophils Absolute: 0 10*3/uL (ref 0.0–0.1)
Basophils Relative: 0 %
Eosinophils Absolute: 0 10*3/uL (ref 0.0–0.5)
Eosinophils Relative: 0 %
HCT: 33.6 % — ABNORMAL LOW (ref 36.0–46.0)
Hemoglobin: 11.1 g/dL — ABNORMAL LOW (ref 12.0–15.0)
Immature Granulocytes: 1 %
Lymphocytes Relative: 59 %
Lymphs Abs: 1.5 10*3/uL (ref 0.7–4.0)
MCH: 27.9 pg (ref 26.0–34.0)
MCHC: 33 g/dL (ref 30.0–36.0)
MCV: 84.4 fL (ref 80.0–100.0)
Monocytes Absolute: 0.3 10*3/uL (ref 0.1–1.0)
Monocytes Relative: 13 %
Neutro Abs: 0.7 10*3/uL — ABNORMAL LOW (ref 1.7–7.7)
Neutrophils Relative %: 27 %
Platelets: 173 10*3/uL (ref 150–400)
RBC: 3.98 MIL/uL (ref 3.87–5.11)
RDW: 18.8 % — ABNORMAL HIGH (ref 11.5–15.5)
Smear Review: NORMAL
WBC: 2.6 10*3/uL — ABNORMAL LOW (ref 4.0–10.5)
nRBC: 0 % (ref 0.0–0.2)

## 2021-05-25 LAB — COMPREHENSIVE METABOLIC PANEL
ALT: 33 U/L (ref 0–44)
AST: 29 U/L (ref 15–41)
Albumin: 3.5 g/dL (ref 3.5–5.0)
Alkaline Phosphatase: 82 U/L (ref 38–126)
Anion gap: 7 (ref 5–15)
BUN: 13 mg/dL (ref 8–23)
CO2: 26 mmol/L (ref 22–32)
Calcium: 9 mg/dL (ref 8.9–10.3)
Chloride: 103 mmol/L (ref 98–111)
Creatinine, Ser: 0.76 mg/dL (ref 0.44–1.00)
GFR, Estimated: >60 mL/min (ref >60)
Glucose, Bld: 160 mg/dL — ABNORMAL HIGH (ref 70–99)
Potassium: 4.1 mmol/L (ref 3.5–5.1)
Sodium: 136 mmol/L (ref 135–145)
Total Bilirubin: 0.3 mg/dL (ref 0.3–1.2)
Total Protein: 7.2 g/dL (ref 6.5–8.1)

## 2021-05-25 LAB — TROPONIN I (HIGH SENSITIVITY): Troponin I (High Sensitivity): 5 ng/L (ref <18)

## 2021-05-25 MED ORDER — SODIUM CHLORIDE 0.9 % IV BOLUS
500.0000 mL | Freq: Once | INTRAVENOUS | Status: AC
  Administered 2021-05-25: 500 mL via INTRAVENOUS

## 2021-05-25 MED ORDER — IOHEXOL 350 MG/ML SOLN
100.0000 mL | Freq: Once | INTRAVENOUS | Status: AC | PRN
  Administered 2021-05-25: 100 mL via INTRAVENOUS

## 2021-05-25 MED ORDER — HYDROCODONE-ACETAMINOPHEN 5-325 MG PO TABS: 1.0000 | ORAL_TABLET | Freq: Four times a day (QID) | ORAL | 0 refills | Status: DC | PRN

## 2021-05-25 NOTE — Discharge Instructions (Addendum)
Begin taking hydrocodone as prescribed as needed for pain.  Follow-up with primary doctor if symptoms or not improving in the next 3 to 4 days, and return to the ER symptoms significantly worsen or change in the meantime.

## 2021-05-25 NOTE — ED Provider Notes (Signed)
[facility] Provider Note   CSN: [id number] Arrival date & time: 05/25/21  0057     History Chief Complaint  Patient presents with   Back Pain   Chest Pain    Kristen Murray is a 75 y.o. female.  Patient is a 75 year old female with past medical history of coronary artery disease, diabetes, hypertension, hyperlipidemia.  Patient presenting for evaluation of pain in her upper abdomen/chest radiating into her back.  This began suddenly yesterday.  She reports she was hanging a shirt in the closet when she felt a pop in her upper abdomen, followed by constant pain in her back.  She denies shortness of breath or numbness or tingling of the extremities.  The history is provided by the patient.      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   Atrial flutter ([place]) 01/29/2020   A-fib ([place]) 01/27/2020   Atrial flutter, 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   Iron deficiency anemia due to chronic blood loss 07/18/2019   Pain in the chest     Chest pain at rest 11/26/2014   Shingles 05/14/2014   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   Vaginal atrophy 10/16/2012   Menopause 10/16/2012   Chest pain, atypical 02/19/2012   Type 2 diabetes mellitus with neurological manifestations, 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   COSTOCHONDRITIS 11/16/2010   DISTURBANCES OF SENSATION OF SMELL AND TASTE 04/05/2010   NEPHROLITHIASIS, HX OF 02/08/2010   EDEMA 10/01/2009   Brachial neuritis or radiculitis NOS 06/14/2009   EXTERNAL HEMORRHOIDS 05/25/2009   MUSCLE PAIN 05/17/2009   Hyperlipidemia 04/23/2009   ABNORMAL THYROID FUNCTION TESTS 04/23/2009   MENINGIOMA 12/26/2007   HEADACHE 12/26/2007   RADICULOPATHY 04/02/2007   Essential hypertension 11/21/2006   GERD 11/21/2006    Past Surgical History:  Procedure Laterality Date   ABDOMINAL HYSTERECTOMY     TAH/BSO --fibroids, no cancer   BALLOON DILATION N/A 07/20/2014   Procedure: BALLOON DILATION;  Surgeon: Garlan Fair, MD;  Location: [facility];  Service: Endoscopy;  Laterality: N/A;   BREAST BIOPSY     Right   CARPAL TUNNEL RELEASE     & trigger thumb RUE; Dr Alphonzo Cruise   COLONOSCOPY W/ POLYPECTOMY  07/2004   Neg 2011; Dr Earle Gell   CYSTOSCOPY  11/2009   Neg   ESOPHAGOGASTRODUODENOSCOPY N/A 07/20/2014   Procedure: ESOPHAGOGASTRODUODENOSCOPY (EGD);  Surgeon: Garlan Fair, MD;  Location: [facility];  Service: Endoscopy;  Laterality: N/A;   FLEXIBLE BRONCHOSCOPY W/ UPPER ENDOSCOPY     neg   KNEE ARTHROSCOPY     Left   ROTATOR CUFF REPAIR     R shoulder   TONSILLECTOMY     TRIGGER FINGER RELEASE Right    Right Thumb     OB History     Gravida  3   Para  3   Term  3   Preterm      AB      Living  3      SAB      IAB      Ectopic      Multiple      Live Births  3           Family History  Problem Relation Age of Onset   Alcohol abuse Mother    Diabetes Maternal Aunt    Diabetes Maternal Uncle    Diabetes Maternal Grandmother    Tuberculosis Maternal  Uncle    Other Father        unknown   Coronary artery disease Neg Hx     Social History   Tobacco Use   Smoking status: Never   Smokeless tobacco: Never  Vaping Use   Vaping Use: Never used  Substance Use Topics   Alcohol use: No    Alcohol/week: 0.0 standard drinks   Drug use: No    Home Medications Prior to Admission medications   Medication Sig Start Date End Date Taking? Authorizing Provider  acetaminophen (TYLENOL) 325 MG tablet Take 325-650 mg by mouth every 6 (six) hours as needed (for pain).    [provider]  acyclovir (ZOVIRAX) 400 MG tablet Take 400 mg by mouth 3 (three) times daily. 04/14/21   [provider]  ALPRAZolam Duanne Moron) 0.25 MG tablet Take 0.25 mg by mouth 2 (two) times daily as needed. 04/19/21   [provider]  apixaban (ELIQUIS) 5 MG TABS tablet Take 1 tablet (5 mg total) by mouth 2 (two) times daily. 02/14/21   Nahser, Wonda Cheng, MD  ascorbic acid (VITAMIN C) 100 MG tablet Take 100 mg by mouth daily.    [provider]  b complex vitamins capsule 1 tablet    [provider]  chlorpheniramine-HYDROcodone (TUSSIONEX) 10-8 MG/5ML SUER 5 ml as needed 04/04/21   [provider]  Cholecalciferol 25 MCG (1000 UT) capsule Take 1,000 Units by mouth daily.    [provider]  cyclobenzaprine (FLEXERIL) 10 MG tablet Take 2.5 mg by mouth daily as needed for muscle spasms.    [provider]  diclofenac (VOLTAREN) 0.1 % ophthalmic solution 4 (four) times daily.    [provider]  ferrous gluconate (FERGON) 240 (27 FE) MG tablet Take 27 mg by mouth daily.    [provider]  fluconazole (DIFLUCAN) 150 MG tablet Take 150 mg by mouth. 04/08/21   [provider]  fluorometholone (FML) 0.1 % ophthalmic suspension Place 1 drop into both eyes every Monday, Wednesday, and Friday.     [provider]  fluticasone (FLONASE) 50 MCG/ACT nasal spray Place 1 spray into both  nostrils 2 (two) times daily. 05/24/21   Kozlow, Donnamarie Poag, MD  folic acid (FOLVITE) A999333 MCG tablet 1 tablet    [provider]  gabapentin (NEURONTIN) 300 MG capsule Take 1 capsule by mouth daily. 04/21/21   [provider]  glipiZIDE (GLUCOTROL XL) 5 MG 24 hr tablet Take 5 mg  by mouth daily. 03/18/21   [provider]  glucose blood (FREESTYLE LITE) test strip CHECK BLOOD SUGAR ONCE DAILY AS DIRECTED.  DX:790.29 01/09/14   Hendricks Limes, MD  hydrocortisone 1 % ointment Apply 1 application topically 2 (two) times daily. 04/13/20   Marny Lowenstein A, NP  ipratropium (ATROVENT HFA) 17 MCG/ACT inhaler Inhale 2 puffs into the lungs as needed.    [provider]  ipratropium (ATROVENT) 0.03 % nasal spray 2 sprays in each nostril    [provider]  Lancets (FREESTYLE) lancets Check blood sugar once daily as directed DX:790.29 (FREESTYLE FREEDOM LITE) 01/09/14   Hendricks Limes, MD  loratadine (CLARITIN) 10 MG tablet Take 1 tablet (10 mg total) by mouth daily. 01/10/20   Isla Pence, MD  metoprolol tartrate (LOPRESSOR) 50 MG tablet Take 1.5 tablets (75 mg total) by mouth 2 (two) times daily. 02/14/21   Nahser, Wonda Cheng, MD  montelukast (SINGULAIR) 10 MG tablet Take 1 tablet (10 mg total) by mouth at bedtime. 05/24/21   Kozlow, Donnamarie Poag, MD  Multiple Vitamin (MULTIVITAMIN) tablet Take 1 tablet by mouth daily. Shaklee brand    [provider]  nitroGLYCERIN (NITROSTAT) 0.4 MG SL tablet Place 1 tablet (0.4 mg total) under the tongue every 5 (five) minutes as needed for chest pain. Patient not taking: Reported on 03/25/2021 01/11/21 04/11/21  Richardson Dopp T, PA-C  pantoprazole (PROTONIX) 40 MG tablet Take 40 mg by mouth 2 (two) times daily. 01/23/20   [provider]  rosuvastatin (CRESTOR) 5 MG tablet Take 5 mg by mouth See admin instructions. Take 5 mg by mouth at bedtime on Mondays and Thursdays..Currently on hold 11/20/19   [provider]   traMADol (ULTRAM) 50 MG tablet Take 50 mg by mouth daily as needed.    [provider]  trolamine salicylate (ASPERCREME) 10 % cream Apply 1 application topically as needed for muscle pain.    [provider]  verapamil (CALAN-SR) 180 MG CR tablet Take 180 mg by mouth at bedtime.  01/02/17   [provider]    Allergies    Codeine, Macrodantin [nitrofurantoin macrocrystal], Other, Propoxyphene, Propoxyphene hcl, Latex, Banana, Ciprofloxacin, Tizanidine, and Tramadol  Review of Systems   Review of Systems  All other systems reviewed and are negative.  Physical Exam Updated Vital Signs BP (!) 177/89   Pulse 73   Temp 98.4 F (36.9 C) (Oral)   Resp 19   Ht '5\' 5"'$  (1.651 m)   Wt 78.9 kg   LMP 07/30/1990   SpO2 98%   BMI 28.96 kg/m   Physical Exam Vitals and nursing note reviewed.  Constitutional:      General: She is not in acute distress.    Appearance: She is well-developed. She is not diaphoretic.  HENT:     Head: Normocephalic and atraumatic.  Cardiovascular:     Rate and Rhythm: Normal rate and regular rhythm.     Heart sounds: No murmur heard.   No friction rub. No gallop.  Pulmonary:     Effort: Pulmonary effort is normal. No respiratory distress.     Breath sounds: Normal breath sounds. No wheezing.  Abdominal:     General: Bowel sounds are normal. There is no distension.     Palpations: Abdomen is soft.     Tenderness: There is no abdominal tenderness.  Musculoskeletal:        General: Normal range of motion.     Cervical back: Normal range  of motion and neck supple.     Right lower leg: No tenderness. No edema.     Left lower leg: No tenderness. No edema.     Comments: There is no calf tenderness.  Skin:    General: Skin is warm and dry.  Neurological:     General: No focal deficit present.     Mental Status: She is alert and oriented to person, place, and time.    ED Results / Procedures / Treatments   Labs (all labs ordered  are listed, but only abnormal results are displayed) Labs Reviewed - No data to display  EKG None  Radiology No results found.  Procedures Procedures   Medications Ordered in ED Medications  sodium chloride 0.9 % bolus 500 mL (has no administration in time range)    ED Course  I have reviewed the triage vital signs and the nursing notes.  Pertinent labs & imaging results that were available during my care of the patient were reviewed by me and considered in my medical decision making (see chart for details).    MDM Rules/Calculators/A&P  Patient presenting here with complaints of pain in her chest/upper abdomen radiating into her back.  This started suddenly while she was hanging up laundry yesterday evening.  Symptoms sound musculoskeletal in nature, but also concerning enough to proceed with CT scan of the chest, abdomen, and pelvis.  This showed no evidence for dissection or other acute abnormality.  There was the mention of a stripe of fluid around the left lung and anterior to the descending thoracic aorta, with pericarditis mentioned as a possibility.  She has no EKG findings consistent with pericarditis and this does not seem to fit the clinical picture.  Her troponin is negative.  EKG is unchanged.  I highly doubt a cardiac etiology.  Patient's pain does seem musculoskeletal and I feel as though I have ruled out emergent pathology.  Patient to be discharged with tramadol and return as needed.  Final Clinical Impression(s) / ED Diagnoses Final diagnoses:  None    Rx / DC Orders ED Discharge Orders     None        Veryl Speak, MD 05/25/21 [phone]

## 2021-05-25 NOTE — ED Triage Notes (Signed)
Pt reports on Monday reaching up to grab something and felt a "pop" in epigastric area and has been having pain in epigastric area and in mid-upper back; pt reports [facility]

## 2021-06-03 ENCOUNTER — Inpatient Hospital Stay: Payer: Medicare Other | Attending: Hematology & Oncology

## 2021-06-03 ENCOUNTER — Encounter: Payer: Self-pay | Admitting: Hematology & Oncology

## 2021-06-03 ENCOUNTER — Inpatient Hospital Stay (HOSPITAL_BASED_OUTPATIENT_CLINIC_OR_DEPARTMENT_OTHER): Payer: Medicare Other | Admitting: Hematology & Oncology

## 2021-06-03 ENCOUNTER — Other Ambulatory Visit: Payer: Self-pay

## 2021-06-03 VITALS — BP 132/69 | HR 75 | Temp 98.7°F | Resp 16 | Wt 171.0 lb

## 2021-06-03 DIAGNOSIS — D4620 Refractory anemia with excess of blasts, unspecified: Secondary | ICD-10-CM

## 2021-06-03 DIAGNOSIS — K909 Intestinal malabsorption, unspecified: Secondary | ICD-10-CM | POA: Diagnosis not present

## 2021-06-03 DIAGNOSIS — D46Z Other myelodysplastic syndromes: Secondary | ICD-10-CM

## 2021-06-03 DIAGNOSIS — D469 Myelodysplastic syndrome, unspecified: Secondary | ICD-10-CM | POA: Diagnosis present

## 2021-06-03 DIAGNOSIS — D508 Other iron deficiency anemias: Secondary | ICD-10-CM | POA: Insufficient documentation

## 2021-06-03 LAB — CBC WITH DIFFERENTIAL (CANCER CENTER ONLY)
Abs Immature Granulocytes: 0.02 10*3/uL (ref 0.00–0.07)
Basophils Absolute: 0 10*3/uL (ref 0.0–0.1)
Basophils Relative: 0 %
Eosinophils Absolute: 0 10*3/uL (ref 0.0–0.5)
Eosinophils Relative: 1 %
HCT: 33.8 % — ABNORMAL LOW (ref 36.0–46.0)
Hemoglobin: 10.9 g/dL — ABNORMAL LOW (ref 12.0–15.0)
Immature Granulocytes: 1 %
Lymphocytes Relative: 64 %
Lymphs Abs: 1.7 10*3/uL (ref 0.7–4.0)
MCH: 27.7 pg (ref 26.0–34.0)
MCHC: 32.2 g/dL (ref 30.0–36.0)
MCV: 86 fL (ref 80.0–100.0)
Monocytes Absolute: 0.4 10*3/uL (ref 0.1–1.0)
Monocytes Relative: 14 %
Neutro Abs: 0.6 10*3/uL — ABNORMAL LOW (ref 1.7–7.7)
Neutrophils Relative %: 20 %
Platelet Count: 194 10*3/uL (ref 150–400)
RBC: 3.93 MIL/uL (ref 3.87–5.11)
RDW: 18.1 % — ABNORMAL HIGH (ref 11.5–15.5)
WBC Count: 2.7 10*3/uL — ABNORMAL LOW (ref 4.0–10.5)
nRBC: 0 % (ref 0.0–0.2)

## 2021-06-03 LAB — CMP (CANCER CENTER ONLY)
ALT: 17 U/L (ref 0–44)
AST: 14 U/L — ABNORMAL LOW (ref 15–41)
Albumin: 3.8 g/dL (ref 3.5–5.0)
Alkaline Phosphatase: 84 U/L (ref 38–126)
Anion gap: 6 (ref 5–15)
BUN: 13 mg/dL (ref 8–23)
CO2: 31 mmol/L (ref 22–32)
Calcium: 9.6 mg/dL (ref 8.9–10.3)
Chloride: 103 mmol/L (ref 98–111)
Creatinine: 0.71 mg/dL (ref 0.44–1.00)
GFR, Estimated: >60 mL/min (ref >60)
Glucose, Bld: 134 mg/dL — ABNORMAL HIGH (ref 70–99)
Potassium: 4.1 mmol/L (ref 3.5–5.1)
Sodium: 140 mmol/L (ref 135–145)
Total Bilirubin: 0.3 mg/dL (ref 0.3–1.2)
Total Protein: 7 g/dL (ref 6.5–8.1)

## 2021-06-03 LAB — LACTATE DEHYDROGENASE: LDH: 167 U/L (ref 98–192)

## 2021-06-03 LAB — IRON AND TIBC
Iron: 42 ug/dL (ref 41–142)
Saturation Ratios: 18 % — ABNORMAL LOW (ref 21–57)
TIBC: 238 ug/dL (ref 236–444)
UIBC: 195 ug/dL (ref 120–384)

## 2021-06-03 LAB — RETICULOCYTES
Immature Retic Fract: 19.7 % — ABNORMAL HIGH (ref 2.3–15.9)
RBC.: 3.94 MIL/uL (ref 3.87–5.11)
Retic Count, Absolute: 30.7 10*3/uL (ref 19.0–186.0)
Retic Ct Pct: 0.8 % (ref 0.4–3.1)

## 2021-06-03 LAB — SAVE SMEAR(SSMR), FOR PROVIDER SLIDE REVIEW

## 2021-06-03 LAB — FERRITIN: Ferritin: 551 ng/mL — ABNORMAL HIGH (ref 11–307)

## 2021-06-03 NOTE — Progress Notes (Signed)
Hematology and Oncology Follow Up Visit  Kristen Murray [id number] 02-19-1946 75 y.o. 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  She is doing okay.  She had a nice birthday last month.  She is going have anniversary this month.  In September, she will be going up to [place] to see a play.  May go up to [place] with her daughter.  She has had no problems with infection.  She has had no issues with nausea or vomiting.  She has had no change in bowel or bladder habits.  She does have arthritis.  This does not seem to be too bad right now despite the hot humid weather.  Her last iron studies back in June showed a ferritin of 714 with an iron saturation of 43%.  Her last erythropoietin level was only 31.  As such, we can clearly use Aranesp to try to help with her anemia if her hemoglobin trends downward further.  Overall, her performance status is ECOG 1.   Medications:  Current Outpatient Medications:    acetaminophen (TYLENOL) 325 MG tablet, Take 325-650 mg by mouth every 6 (six) hours as needed (for pain)., Disp: , Rfl:    acyclovir (ZOVIRAX) 400 MG tablet, Take 400 mg by mouth 3 (three) times daily., Disp: , Rfl:    ALPRAZolam (XANAX) 0.25 MG tablet, Take 0.25 mg by mouth 2 (two) times daily as needed., Disp: , Rfl:    apixaban (ELIQUIS) 5 MG TABS tablet, Take 1 tablet (5 mg total) by mouth 2 (two) times daily., Disp: 180 tablet, Rfl: 1   ascorbic acid (VITAMIN C) 100 MG tablet, Take 100 mg by mouth daily., Disp: , Rfl:    b complex vitamins capsule, 1 tablet, Disp: , Rfl:    chlorpheniramine-HYDROcodone (TUSSIONEX) 10-8 MG/5ML SUER, 5 ml as needed, Disp: , Rfl:    Cholecalciferol 25 MCG (1000 UT)  capsule, Take 1,000 Units by mouth daily., Disp: , Rfl:    cyclobenzaprine (FLEXERIL) 10 MG tablet, Take 2.5 mg by mouth daily as needed for muscle spasms., Disp: , Rfl:    diclofenac (VOLTAREN) 0.1 % ophthalmic solution, 4 (four) times daily., Disp: , Rfl:    ferrous gluconate (FERGON) 240 (27 FE) MG tablet, Take 27 mg by mouth daily., Disp: , Rfl:    fluconazole (DIFLUCAN) 150 MG tablet, Take 150 mg by mouth., Disp: , Rfl:    fluorometholone (FML) 0.1 % ophthalmic suspension, Place 1 drop into both eyes every Monday, Wednesday, and Friday. , Disp: , Rfl:    fluticasone (FLONASE) 50 MCG/ACT nasal spray, Place 1 spray into both nostrils 2 (two) times daily., Disp: 16 g, Rfl: 11   folic acid (FOLVITE) A999333 MCG tablet, 1 tablet, Disp: , Rfl:    gabapentin (NEURONTIN) 300 MG capsule, Take 1 capsule by mouth daily., Disp: , Rfl:    glipiZIDE (GLUCOTROL XL) 5 MG 24 hr tablet, Take 5 mg by mouth daily., Disp: , Rfl:    glucose blood (FREESTYLE LITE) test strip, CHECK BLOOD SUGAR ONCE DAILY AS DIRECTED.  DX:790.29, Disp: 100 each, Rfl: 12   HYDROcodone-acetaminophen (NORCO) 5-325 MG tablet, Take 1 tablet by mouth every 6 (six)  hours as needed., Disp: 12 tablet, Rfl: 0   hydrocortisone 1 % ointment, Apply 1 application topically 2 (two) times daily., Disp: 30 g, Rfl: 0   ipratropium (ATROVENT HFA) 17 MCG/ACT inhaler, Inhale 2 puffs into the lungs as needed., Disp: , Rfl:    ipratropium (ATROVENT) 0.03 % nasal spray, 2 sprays in each nostril, Disp: , Rfl:    Lancets (FREESTYLE) lancets, Check blood sugar once daily as directed DX:790.29 (FREESTYLE FREEDOM LITE), Disp: 100 each, Rfl: 1   loratadine (CLARITIN) 10 MG tablet, Take 1 tablet (10 mg total) by mouth daily., Disp: 30 tablet, Rfl: 0   metoprolol tartrate (LOPRESSOR) 50 MG tablet, Take 1.5 tablets (75 mg total) by mouth 2 (two) times daily., Disp: 180 tablet, Rfl: 3   montelukast (SINGULAIR) 10 MG tablet, Take 1 tablet (10 mg total) by mouth at  bedtime., Disp: 30 tablet, Rfl: 11   Multiple Vitamin (MULTIVITAMIN) tablet, Take 1 tablet by mouth daily. Shaklee brand, Disp: , Rfl:    nitroGLYCERIN (NITROSTAT) 0.4 MG SL tablet, Place 1 tablet (0.4 mg total) under the tongue every 5 (five) minutes as needed for chest pain. (Patient not taking: Reported on 03/25/2021), Disp: 25 tablet, Rfl: 3   pantoprazole (PROTONIX) 40 MG tablet, Take 40 mg by mouth 2 (two) times daily., Disp: , Rfl:    rosuvastatin (CRESTOR) 5 MG tablet, Take 5 mg by mouth See admin instructions. Take 5 mg by mouth at bedtime on Mondays and Thursdays..Currently on hold, Disp: , Rfl:    traMADol (ULTRAM) 50 MG tablet, Take 50 mg by mouth daily as needed., Disp: , Rfl:    trolamine salicylate (ASPERCREME) 10 % cream, Apply 1 application topically as needed for muscle pain., Disp: , Rfl:    verapamil (CALAN-SR) 180 MG CR tablet, Take 180 mg by mouth at bedtime. , Disp: , Rfl:   Allergies:  Allergies  Allergen Reactions   Codeine Hives and Other (See Comments)    Because of a history of documented adverse serious drug reaction, Medi Alert bracelet  is recommended   Macrodantin [Nitrofurantoin Macrocrystal] Other (See Comments)    Numbness, aching all over   Other Shortness Of Breath, Rash and Other (See Comments)    Pine nuts & walnuts throat congestion. No documented angioedema. 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  Review of Systems: Review of Systems  Constitutional: Negative.   HENT:  Negative.    Eyes: Negative.   Respiratory: Negative.    Cardiovascular: Negative.    Gastrointestinal: Negative.   Endocrine: Negative.   Genitourinary: Negative.    Musculoskeletal: Negative.   Skin: Negative.   Neurological: Negative.   Hematological: Negative.   Psychiatric/Behavioral: Negative.     Physical Exam:  vitals were not taken for this visit.   Wt Readings from Last 3 Encounters:  05/25/21 174 lb (78.9 kg)  05/24/21 176 lb (79.8 kg)  04/22/21 167 lb (75.8 kg)    Physical Exam Vitals reviewed.  HENT:     Head: Normocephalic and atraumatic.  Eyes:  Pupils: Pupils are equal, round, and reactive to light.  Cardiovascular:     Rate and Rhythm: Normal rate and regular rhythm.     Heart sounds: Normal heart sounds.  Pulmonary:     Effort: Pulmonary effort is normal.     Breath sounds: Normal breath sounds.  Abdominal:     General: Bowel sounds are normal.     Palpations: Abdomen is soft.  Musculoskeletal:        General: No tenderness or deformity. Normal range of motion.     Cervical back: Normal range of motion.     Comments: There is moderate swelling of the left calf area.  This is somewhat tender to palpation.  There is no redness.  I cannot palpate any obvious venous cord.  There is no pain behind the left knee.  She has decent pulses in her distal extremities.  Lymphadenopathy:     Cervical: No cervical adenopathy.  Skin:    General: Skin is warm and dry.     Findings: No erythema or rash.  Neurological:     Mental Status: She is alert and oriented to person, place, and time.  Psychiatric:        Behavior: Behavior normal.        Thought Content: Thought content normal.        Judgment: Judgment normal.     Lab Results  Component Value Date   WBC 2.7 (L) 06/03/2021   HGB 10.9 (L) 06/03/2021   HCT 33.8 (L) 06/03/2021   MCV 86.0 06/03/2021   PLT 194 06/03/2021     Chemistry      Component Value Date/Time   NA 136 05/25/2021 0157   NA 138 03/10/2020 1138   K 4.1 05/25/2021 0157   CL 103 05/25/2021 0157   CO2 26  05/25/2021 0157   BUN 13 05/25/2021 0157   BUN 10 03/10/2020 1138   CREATININE 0.76 05/25/2021 0157   CREATININE 0.67 04/22/2021 0946      Component Value Date/Time   CALCIUM 9.0 05/25/2021 0157   ALKPHOS 82 05/25/2021 0157   AST 29 05/25/2021 0157   AST 13 (L) 04/22/2021 0946   ALT 33 05/25/2021 0157   ALT 11 04/22/2021 0946   BILITOT 0.3 05/25/2021 0157   BILITOT 0.5 04/22/2021 0946       Impression and Plan: Ms. Seaward is a 75 year old African-American female.   Looks like she does have a low-grade myelodysplasia.  Again she has a very low IPSS-R score.  At this point, we will still follow her along.  I will that her white cell count has not gone down further.  I did look at her blood smear.  I do not see anything that looked suspicious.  If her hemoglobin does not trend down further, we will clearly have to boost it back up.  Again I think that she would respond to Aranesp.  I would like to try to get her in before she goes up to [place].  She will be going up to [place] the second weekend in September.      [name], MD 8/5/20229:04 AM

## 2021-06-06 ENCOUNTER — Other Ambulatory Visit: Payer: Self-pay

## 2021-06-06 ENCOUNTER — Encounter: Payer: Self-pay | Admitting: Obstetrics and Gynecology

## 2021-06-06 ENCOUNTER — Ambulatory Visit: Payer: Medicare Other | Admitting: Obstetrics and Gynecology

## 2021-06-06 VITALS — BP 140/68 | Ht 65.0 in

## 2021-06-06 DIAGNOSIS — N763 Subacute and chronic vulvitis: Secondary | ICD-10-CM

## 2021-06-06 MED ORDER — CLOTRIMAZOLE-BETAMETHASONE 1-0.05 % EX CREA: 1.0000 "application " | TOPICAL_CREAM | Freq: Two times a day (BID) | CUTANEOUS | 0 refills | Status: DC

## 2021-06-06 MED ORDER — LIDOCAINE 5 % EX OINT: 1.0000 "application " | TOPICAL_OINTMENT | Freq: Three times a day (TID) | CUTANEOUS | 0 refills | Status: AC | PRN

## 2021-06-06 NOTE — Progress Notes (Signed)
GYNECOLOGY  VISIT   HPI: 75 y.o.   Married  [place] American  female   [phone] with Patient's last menstrual period was 07/30/1990.   here for vulvar irritation for over a year.   Patient received hydrocortisone 1% in June, 2021, and it is not helping.   She is having thigh itching and burning.  She noticed blood on her pad after itching.   No vaginal discharge.  No known odor.   She is wearing light pads daily due to urinary incontinence.   No change in her personal products.   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  Has Rx for Acyclovir.   Last A1C was about 7.3.  GYNECOLOGIC HISTORY: Patient's last menstrual period was 07/30/1990. 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   Atrial flutter ([place]) 01/29/2020   A-fib ([place]) 01/27/2020   Atrial flutter, paroxysmal ([place]) 01/26/2020   Iron deficiency anemia due to chronic blood loss 07/18/2019   Pain in the chest    Chest pain at rest 11/26/2014   Shingles 05/14/2014   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   Vaginal atrophy 10/16/2012   Menopause 10/16/2012   Chest pain, atypical 02/19/2012   Type 2 diabetes mellitus with neurological manifestations, 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   COSTOCHONDRITIS 11/16/2010   DISTURBANCES OF SENSATION OF SMELL AND TASTE 04/05/2010   NEPHROLITHIASIS, HX OF 02/08/2010   EDEMA 10/01/2009   Brachial neuritis or radiculitis NOS 06/14/2009   EXTERNAL HEMORRHOIDS 05/25/2009   MUSCLE PAIN 05/17/2009    Hyperlipidemia 04/23/2009   ABNORMAL THYROID FUNCTION TESTS 04/23/2009   MENINGIOMA 12/26/2007   HEADACHE 12/26/2007   RADICULOPATHY 04/02/2007   Essential hypertension 11/21/2006   GERD 11/21/2006    Past Medical History:  Diagnosis Date   Arthritis    Borderline abnormal TFTs    as a teen   CAD (coronary artery disease)    Coronary CTA 3/22: Calcium score 1 (38th percentile), minimal nonobstructive CAD with 0-24% in mid RCA; aortic atherosclerosis   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   Diabetes mellitus without complication ([place])    E. 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    HTN (hypertension)    Hyperglycemia    Hyperlipidemia    Meningioma ([facility])    Myelodysplasia, low grade ([place]) 11/18/2020   Nephrolithiasis    Dr Amalia Hailey, [facility], Hx of [3][   Polyp of colon    Radiculopathy     Past Surgical History:  Procedure  Laterality Date   ABDOMINAL HYSTERECTOMY     TAH/BSO --fibroids, no cancer   BALLOON DILATION N/A 07/20/2014   Procedure: BALLOON DILATION;  Surgeon: Garlan Fair, MD;  Location: [facility];  Service: Endoscopy;  Laterality: N/A;   BREAST BIOPSY     Right   CARPAL TUNNEL RELEASE     & trigger thumb RUE; Dr Alphonzo Cruise   COLONOSCOPY W/ POLYPECTOMY  07/2004   Neg 2011; Dr Earle Gell   CYSTOSCOPY  11/2009   Neg   ESOPHAGOGASTRODUODENOSCOPY N/A 07/20/2014   Procedure: ESOPHAGOGASTRODUODENOSCOPY (EGD);  Surgeon: Garlan Fair, MD;  Location: [facility];  Service: Endoscopy;  Laterality: N/A;   FLEXIBLE BRONCHOSCOPY W/ UPPER ENDOSCOPY     neg   KNEE ARTHROSCOPY     Left   ROTATOR CUFF REPAIR     R shoulder   TONSILLECTOMY     TRIGGER FINGER RELEASE Right    Right Thumb    Current Outpatient Medications  Medication Sig Dispense  Refill   acetaminophen (TYLENOL) 325 MG tablet Take 325-650 mg by mouth every 6 (six) hours as needed (for pain).     acyclovir (ZOVIRAX) 400 MG tablet Take 400 mg by mouth 3 (three) times daily.     ALPRAZolam (XANAX) 0.25 MG tablet Take 0.25 mg by mouth 2 (two) times daily as needed.     apixaban (ELIQUIS) 5 MG TABS tablet Take 1 tablet (5 mg total) by mouth 2 (two) times daily. 180 tablet 1   ascorbic acid (VITAMIN C) 100 MG tablet Take 100 mg by mouth daily.     b complex vitamins capsule 1 tablet     Cholecalciferol 25 MCG (1000 UT) capsule Take 1,000 Units by mouth daily.     cyclobenzaprine (FLEXERIL) 10 MG tablet Take 2.5 mg by mouth daily as needed for muscle spasms.     diclofenac (VOLTAREN) 0.1 % ophthalmic solution 4 (four) times daily.     ferrous gluconate (FERGON) 240 (27 FE) MG tablet Take 27 mg by mouth daily.     fluorometholone (FML) 0.1 % ophthalmic suspension Place 1 drop into both eyes every Monday, Wednesday, and Friday.      fluticasone (FLONASE) 50 MCG/ACT nasal spray Place 1 spray into both nostrils 2 (two) times daily. 16 g 11   folic acid (FOLVITE) A999333 MCG tablet 1 tablet     gabapentin (NEURONTIN) 300 MG capsule Take 1 capsule by mouth daily.     glipiZIDE (GLUCOTROL XL) 5 MG 24 hr tablet Take 5 mg by mouth daily.     glucose blood (FREESTYLE LITE) test strip CHECK BLOOD SUGAR ONCE DAILY AS DIRECTED.  DX:790.29 100 each 12   hydrocortisone 1 % ointment Apply 1 application topically 2 (two) times daily. 30 g 0   ipratropium (ATROVENT HFA) 17 MCG/ACT inhaler Inhale 2 puffs into the lungs as needed.     ipratropium (ATROVENT) 0.03 % nasal spray 2 sprays in each nostril     Lancets (FREESTYLE) lancets Check blood sugar once daily as directed DX:790.29 (FREESTYLE FREEDOM LITE) 100 each 1   loratadine (CLARITIN) 10 MG tablet Take 1 tablet (10 mg total) by mouth daily. 30 tablet 0   metoprolol tartrate (LOPRESSOR) 50 MG tablet Take 1.5 tablets (75 mg total) by mouth 2  (two) times daily. 180 tablet 3   montelukast (SINGULAIR) 10 MG tablet Take 1 tablet (10 mg total) by mouth at bedtime. 30 tablet 11   Multiple Vitamin (MULTIVITAMIN) tablet Take 1 tablet  by mouth daily. Shaklee brand     nitroGLYCERIN (NITROSTAT) 0.4 MG SL tablet Place 1 tablet (0.4 mg total) under the tongue every 5 (five) minutes as needed for chest pain. (Patient not taking: Reported on 03/25/2021) 25 tablet 3   pantoprazole (PROTONIX) 40 MG tablet Take 40 mg by mouth 2 (two) times daily.     rosuvastatin (CRESTOR) 5 MG tablet Take 5 mg by mouth See admin instructions. Take 5 mg by mouth at bedtime on Mondays and Thursdays..Currently on hold     trolamine salicylate (ASPERCREME) 10 % cream Apply 1 application topically as needed for muscle pain.     verapamil (CALAN-SR) 180 MG CR tablet Take 180 mg by mouth at bedtime.      No current facility-administered medications for this visit.     ALLERGIES: Codeine, Macrodantin [nitrofurantoin macrocrystal], Other, Propoxyphene, Propoxyphene hcl, Latex, Neosporin [bacitracin-polymyxin b], Avocado, Banana, Ciprofloxacin, Tizanidine, and Tramadol  Family History  Problem Relation Age of Onset   Alcohol abuse Mother    Diabetes Maternal Aunt    Diabetes Maternal Uncle    Diabetes Maternal Grandmother    Tuberculosis Maternal Uncle    Other Father        unknown   Coronary artery disease Neg Hx     Social History   Socioeconomic History   Marital status: Married    Spouse name: Not on file   Number of children: Not on file   Years of education: Not on file   Highest education level: Not on file  Occupational History   Occupation: Estate manager/land agent: [employer]  Tobacco Use   Smoking status: Never   Smokeless tobacco: Never  Vaping Use   Vaping Use: Never used  Substance and Sexual Activity   Alcohol use: No    Alcohol/week: 0.0 standard drinks   Drug use: No   Sexual activity: Not Currently    Birth  control/protection: Post-menopausal    Comment: 1st intercourse- 20  partners - 1  Other Topics Concern   Not on file  Social History Narrative   Regular Exercise-no         Social Determinants of Health   Financial Resource Strain: Not on file  Food Insecurity: Not on file  Transportation Needs: Not on file  Physical Activity: Not on file  Stress: Not on file  Social Connections: Not on file  Intimate Partner Violence: Not on file    Review of Systems  Genitourinary:        Vaginal irritation   All other systems reviewed and are negative.  PHYSICAL EXAMINATION:    BP 140/68   Ht '5\' 5"'$  (1.651 m)   LMP 07/30/1990   BMI 28.46 kg/m     General appearance: alert, cooperative and appears stated age  Pelvic: External genitalia: ulcerative slit in the skin of the left labia minora.   Greyish coloration of the perineal area and medial thighs.  Introitus with moderate brown coloration of the skin.               Urethra:  normal appearing urethra with no masses, tenderness or lesions              Bartholins and Skenes: normal                 Vagina: normal appearing vagina with normal color and discharge, no lesions              Cervix: absent.  Bimanual Exam:  Uterus:  absent              Adnexa: no mass, fullness, tenderness            Chaperone was present for exam:  Raquel Sarna, RN.  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  Likely has some yeast component, contact dermatitis, chronic itch/scratch.  DM.  Hx HSV.  Urinary incontinence.  Using pads.   PLAN  Lotrisone cream bid x 2 weeks.  Lidocaine ointment tid prn.  Try a new pads.  Good control of blood sugar encouraged.  Fu if symptoms not resolved.    An After Visit Summary was printed and given to the patient.  24 mini  total time was spent for this patient encounter, including preparation, face-to-face counseling with the patient, coordination of care, and documentation of the  encounter.

## 2021-07-05 ENCOUNTER — Other Ambulatory Visit: Payer: Self-pay

## 2021-07-05 ENCOUNTER — Inpatient Hospital Stay: Payer: Medicare Other | Attending: Hematology & Oncology

## 2021-07-05 ENCOUNTER — Encounter: Payer: Self-pay | Admitting: Hematology & Oncology

## 2021-07-05 ENCOUNTER — Inpatient Hospital Stay (HOSPITAL_BASED_OUTPATIENT_CLINIC_OR_DEPARTMENT_OTHER): Payer: Medicare Other | Admitting: Hematology & Oncology

## 2021-07-05 ENCOUNTER — Inpatient Hospital Stay: Payer: Medicare Other

## 2021-07-05 VITALS — BP 131/68 | HR 57 | Temp 98.3°F | Wt 175.8 lb

## 2021-07-05 DIAGNOSIS — D508 Other iron deficiency anemias: Secondary | ICD-10-CM | POA: Diagnosis not present

## 2021-07-05 DIAGNOSIS — R945 Abnormal results of liver function studies: Secondary | ICD-10-CM

## 2021-07-05 DIAGNOSIS — D469 Myelodysplastic syndrome, unspecified: Secondary | ICD-10-CM | POA: Insufficient documentation

## 2021-07-05 DIAGNOSIS — K909 Intestinal malabsorption, unspecified: Secondary | ICD-10-CM | POA: Diagnosis not present

## 2021-07-05 DIAGNOSIS — D4620 Refractory anemia with excess of blasts, unspecified: Secondary | ICD-10-CM

## 2021-07-05 DIAGNOSIS — D46Z Other myelodysplastic syndromes: Secondary | ICD-10-CM

## 2021-07-05 DIAGNOSIS — R7989 Other specified abnormal findings of blood chemistry: Secondary | ICD-10-CM

## 2021-07-05 LAB — CBC WITH DIFFERENTIAL (CANCER CENTER ONLY)
Abs Immature Granulocytes: 0.04 10*3/uL (ref 0.00–0.07)
Basophils Absolute: 0 10*3/uL (ref 0.0–0.1)
Basophils Relative: 0 %
Eosinophils Absolute: 0 10*3/uL (ref 0.0–0.5)
Eosinophils Relative: 0 %
HCT: 34 % — ABNORMAL LOW (ref 36.0–46.0)
Hemoglobin: 11 g/dL — ABNORMAL LOW (ref 12.0–15.0)
Immature Granulocytes: 1 %
Lymphocytes Relative: 78 %
Lymphs Abs: 2.9 10*3/uL (ref 0.7–4.0)
MCH: 27.9 pg (ref 26.0–34.0)
MCHC: 32.4 g/dL (ref 30.0–36.0)
MCV: 86.3 fL (ref 80.0–100.0)
Monocytes Absolute: 0.3 10*3/uL (ref 0.1–1.0)
Monocytes Relative: 9 %
Neutro Abs: 0.5 10*3/uL — ABNORMAL LOW (ref 1.7–7.7)
Neutrophils Relative %: 12 %
Platelet Count: 178 10*3/uL (ref 150–400)
RBC: 3.94 MIL/uL (ref 3.87–5.11)
RDW: 17.5 % — ABNORMAL HIGH (ref 11.5–15.5)
WBC Count: 3.7 10*3/uL — ABNORMAL LOW (ref 4.0–10.5)
nRBC: 0 % (ref 0.0–0.2)

## 2021-07-05 LAB — RETICULOCYTES
Immature Retic Fract: 10 % (ref 2.3–15.9)
RBC.: 3.92 MIL/uL (ref 3.87–5.11)
Retic Count, Absolute: 77.6 10*3/uL (ref 19.0–186.0)
Retic Ct Pct: 2 % (ref 0.4–3.1)

## 2021-07-05 LAB — CMP (CANCER CENTER ONLY)
ALT: 123 U/L — ABNORMAL HIGH (ref 0–44)
AST: 56 U/L — ABNORMAL HIGH (ref 15–41)
Albumin: 3.9 g/dL (ref 3.5–5.0)
Alkaline Phosphatase: 80 U/L (ref 38–126)
Anion gap: 7 (ref 5–15)
BUN: 21 mg/dL (ref 8–23)
CO2: 29 mmol/L (ref 22–32)
Calcium: 9.7 mg/dL (ref 8.9–10.3)
Chloride: 103 mmol/L (ref 98–111)
Creatinine: 0.85 mg/dL (ref 0.44–1.00)
GFR, Estimated: >60 mL/min (ref >60)
Glucose, Bld: 113 mg/dL — ABNORMAL HIGH (ref 70–99)
Potassium: 3.7 mmol/L (ref 3.5–5.1)
Sodium: 139 mmol/L (ref 135–145)
Total Bilirubin: 0.3 mg/dL (ref 0.3–1.2)
Total Protein: 6.8 g/dL (ref 6.5–8.1)

## 2021-07-05 NOTE — Progress Notes (Signed)
Hematology and Oncology Follow Up Visit  Kristen Murray [id number] 1946-04-09 75 y.o. 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  She is doing okay.  She is getting ready to go up to [place] for a play.  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  It will be [name].  She feels well.  She has had no problems over Labor Day weekend.  She has had no issues with nausea or vomiting.  She has had no problems with bowels or bladder.  The only thing I have noticed is that her liver function studies are elevated.  I am not sure as to why they would be elevated.  She is on a statin but she has been on this for 1-2 years.  I think be worthwhile checking ultrasound of her liver.  Because she is asymptomatic, we can do this when she gets back from [place].  She has had no problems with rashes.  There is been no bleeding.  She has had no cough or shortness of breath.  She has had no leg swelling.  Overall, I would say her performance status is ECOG 1.    Medications:  Current Outpatient Medications:    acetaminophen (TYLENOL) 325 MG tablet, Take 325-650 mg by mouth every 6 (six) hours as needed (for pain)., Disp: , Rfl:    acyclovir (ZOVIRAX) 400 MG tablet, Take 400 mg by mouth 3 (three) times daily., Disp: , Rfl:    ALPRAZolam (XANAX) 0.25 MG tablet, Take 0.25 mg by mouth 2 (two) times daily as needed., Disp: , Rfl:    apixaban (ELIQUIS) 5 MG TABS tablet, Take 1 tablet (5 mg total) by mouth 2 (two) times daily., Disp: 180 tablet, Rfl: 1   ascorbic acid (VITAMIN C) 100 MG tablet, Take 100 mg by mouth daily., Disp: , Rfl:    b complex vitamins capsule, 1 tablet, Disp: , Rfl:    Cholecalciferol 25 MCG (1000 UT) capsule, Take 1,000 Units  by mouth daily., Disp: , Rfl:    clotrimazole-betamethasone (LOTRISONE) cream, Apply 1 application topically 2 (two) times daily. Use for 2 weeks., Disp: 30 g, Rfl: 0   cyclobenzaprine (FLEXERIL) 10 MG tablet, Take 2.5 mg by mouth daily as needed for muscle spasms., Disp: , Rfl:    diclofenac (VOLTAREN) 0.1 % ophthalmic solution, 4 (four) times daily., Disp: , Rfl:    ferrous gluconate (FERGON) 240 (27 FE) MG tablet, Take 27 mg by mouth daily., Disp: , Rfl:    fluorometholone (FML) 0.1 % ophthalmic suspension, Place 1 drop into both eyes every Monday, Wednesday, and Friday. , Disp: , Rfl:    fluticasone (FLONASE) 50 MCG/ACT nasal spray, Place 1 spray into both nostrils 2 (two) times daily., Disp: 16 g, Rfl: 11   folic acid (FOLVITE) A999333 MCG tablet, 1 tablet, Disp: , Rfl:    gabapentin (NEURONTIN) 300 MG capsule, Take 1 capsule by mouth daily., Disp: , Rfl:    glipiZIDE (GLUCOTROL XL) 5 MG 24 hr tablet, Take 5 mg by mouth daily., Disp: , Rfl:    glucose blood (FREESTYLE LITE) test strip, CHECK BLOOD SUGAR ONCE DAILY AS DIRECTED.  DX:790.29, Disp: 100 each, Rfl: 12  glucose blood test strip, Inject 1 strip into the skin daily., Disp: , Rfl:    hydrocortisone 1 % ointment, Apply 1 application topically 2 (two) times daily., Disp: 30 g, Rfl: 0   ipratropium (ATROVENT HFA) 17 MCG/ACT inhaler, Inhale 2 puffs into the lungs as needed., Disp: , Rfl:    ipratropium (ATROVENT) 0.03 % nasal spray, 2 sprays in each nostril, Disp: , Rfl:    Lancets (FREESTYLE) lancets, Check blood sugar once daily as directed DX:790.29 (FREESTYLE FREEDOM LITE), Disp: 100 each, Rfl: 1   lidocaine (XYLOCAINE) 5 % ointment, Apply 1 application topically 3 (three) times daily as needed., Disp: 1.25 g, Rfl: 0   loratadine (CLARITIN) 10 MG tablet, Take 1 tablet (10 mg total) by mouth daily., Disp: 30 tablet, Rfl: 0   metoprolol tartrate (LOPRESSOR) 50 MG tablet, Take 1.5 tablets (75 mg total) by mouth 2 (two) times daily., Disp: 180  tablet, Rfl: 3   montelukast (SINGULAIR) 10 MG tablet, Take 1 tablet (10 mg total) by mouth at bedtime., Disp: 30 tablet, Rfl: 11   Multiple Vitamin (MULTIVITAMIN) tablet, Take 1 tablet by mouth daily. Shaklee brand, Disp: , Rfl:    pantoprazole (PROTONIX) 40 MG tablet, Take 40 mg by mouth 2 (two) times daily., Disp: , Rfl:    predniSONE (DELTASONE) 10 MG tablet, Take 10 mg by mouth daily., Disp: , Rfl:    rosuvastatin (CRESTOR) 5 MG tablet, Take 5 mg by mouth See admin instructions. Take 5 mg by mouth at bedtime on Mondays and Thursdays..Currently on hold, Disp: , Rfl:    trolamine salicylate (ASPERCREME) 10 % cream, Apply 1 application topically as needed for muscle pain., Disp: , Rfl:    verapamil (CALAN-SR) 180 MG CR tablet, Take 180 mg by mouth at bedtime. , Disp: , Rfl:    nitroGLYCERIN (NITROSTAT) 0.4 MG SL tablet, Place 1 tablet (0.4 mg total) under the tongue every 5 (five) minutes as needed for chest pain. (Patient not taking: Reported on 03/25/2021), Disp: 25 tablet, Rfl: 3  Allergies:  Allergies  Allergen Reactions   Codeine Hives and Other (See Comments)    Because of a history of documented adverse serious drug reaction, Medi Alert bracelet  is recommended   Macrodantin [Nitrofurantoin Macrocrystal] Other (See Comments)    Numbness, aching all over   Other Shortness Of Breath, Rash and Other (See Comments)    Pine nuts & walnuts throat congestion. No documented angioedema. Avocado- Rash   Propoxyphene Hives, Swelling and Rash   Propoxyphene Hcl Hives, Swelling and Rash   Latex Rash   Neosporin [Bacitracin-Polymyxin B] Other (See Comments)    Blisters   Avocado Rash   Banana Rash   Ciprofloxacin Nausea Only and Other (See Comments)    Nausea (Note: Patient takes this when on mission trips, however)   Tizanidine Other (See Comments)    Reaction not recalled   Tramadol Nausea Only    Past Medical History, Surgical history, Social history, and Family History were reviewed  and updated.  Review of Systems: Review of Systems  Constitutional: Negative.   HENT:  Negative.    Eyes: Negative.   Respiratory: Negative.    Cardiovascular: Negative.   Gastrointestinal: Negative.   Endocrine: Negative.   Genitourinary: Negative.    Musculoskeletal: Negative.   Skin: Negative.   Neurological: Negative.   Hematological: Negative.   Psychiatric/Behavioral: Negative.     Physical Exam:  weight is 175 lb 12.8 oz (79.7 kg). Her oral temperature is 98.3  F (36.8 C). Her blood pressure is 131/68 and her pulse is 57 (abnormal). Her oxygen saturation is 99%.   Wt Readings from Last 3 Encounters:  07/05/21 175 lb 12.8 oz (79.7 kg)  06/03/21 171 lb (77.6 kg)  05/25/21 174 lb (78.9 kg)    Physical Exam Vitals reviewed.  HENT:     Head: Normocephalic and atraumatic.  Eyes:     Pupils: Pupils are equal, round, and reactive to light.  Cardiovascular:     Rate and Rhythm: Normal rate and regular rhythm.     Heart sounds: Normal heart sounds.  Pulmonary:     Effort: Pulmonary effort is normal.     Breath sounds: Normal breath sounds.  Abdominal:     General: Bowel sounds are normal.     Palpations: Abdomen is soft.  Musculoskeletal:        General: No tenderness or deformity. Normal range of motion.     Cervical back: Normal range of motion.     Comments: There is moderate swelling of the left calf area.  This is somewhat tender to palpation.  There is no redness.  I cannot palpate any obvious venous cord.  There is no pain behind the left knee.  She has decent pulses in her distal extremities.  Lymphadenopathy:     Cervical: No cervical adenopathy.  Skin:    General: Skin is warm and dry.     Findings: No erythema or rash.  Neurological:     Mental Status: She is alert and oriented to person, place, and time.  Psychiatric:        Behavior: Behavior normal.        Thought Content: Thought content normal.        Judgment: Judgment normal.     Lab Results   Component Value Date   WBC 3.7 (L) 07/05/2021   HGB 11.0 (L) 07/05/2021   HCT 34.0 (L) 07/05/2021   MCV 86.3 07/05/2021   PLT 178 07/05/2021     Chemistry      Component Value Date/Time   NA 139 07/05/2021 1500   NA 138 03/10/2020 1138   K 3.7 07/05/2021 1500   CL 103 07/05/2021 1500   CO2 29 07/05/2021 1500   BUN 21 07/05/2021 1500   BUN 10 03/10/2020 1138   CREATININE 0.85 07/05/2021 1500      Component Value Date/Time   CALCIUM 9.7 07/05/2021 1500   ALKPHOS 80 07/05/2021 1500   AST 56 (H) 07/05/2021 1500   ALT 123 (H) 07/05/2021 1500   BILITOT 0.3 07/05/2021 1500       Impression and Plan: Kristen Murray is a 75 year old African-American female.   Looks like she does have a low-grade myelodysplasia.  Again she has a very low IPSS-R score.  We will have to focus on the liver right now.  Again I am not sure as to why she has elevated liver function studies.  If everything looks okay on the ultrasound and her liver function studies do not get better when we have them rechecked, we will have to make the referral over to her family doctor.  I am glad that she feels well.  I know she will enjoy the play up in [place].  I would like to see her back myself in about 4 or 5 weeks.  Again we will have her come back in a couple weeks or so for additional labs.   [name], MD 9/6/20223:59 PM

## 2021-07-06 ENCOUNTER — Telehealth: Payer: Self-pay

## 2021-07-06 LAB — IRON AND TIBC
Iron: 78 ug/dL (ref 41–142)
Saturation Ratios: 30 % (ref 21–57)
TIBC: 263 ug/dL (ref 236–444)
UIBC: 184 ug/dL (ref 120–384)

## 2021-07-06 LAB — FERRITIN: Ferritin: 631 ng/mL — ABNORMAL HIGH (ref 11–307)

## 2021-07-12 ENCOUNTER — Other Ambulatory Visit: Payer: Self-pay | Admitting: Hematology & Oncology

## 2021-07-12 ENCOUNTER — Ambulatory Visit (HOSPITAL_BASED_OUTPATIENT_CLINIC_OR_DEPARTMENT_OTHER)
Admission: RE | Admit: 2021-07-12 | Discharge: 2021-07-12 | Disposition: A | Discharge status: Home / Self Care | Payer: Medicare Other | Source: Ambulatory Visit | Attending: Hematology & Oncology | Admitting: Hematology & Oncology

## 2021-07-12 ENCOUNTER — Other Ambulatory Visit: Payer: Self-pay

## 2021-07-12 DIAGNOSIS — R7989 Other specified abnormal findings of blood chemistry: Secondary | ICD-10-CM

## 2021-07-12 DIAGNOSIS — R945 Abnormal results of liver function studies: Secondary | ICD-10-CM | POA: Diagnosis not present

## 2021-07-13 ENCOUNTER — Telehealth: Payer: Self-pay

## 2021-07-13 NOTE — Telephone Encounter (Signed)
Called and LMOM (ok per DPR) informing patient of results. Requested patient CB with any questions or concerns.

## 2021-07-13 NOTE — Telephone Encounter (Signed)
-----   Message from [name], MD sent at 07/13/2021  8:20 AM EDT ----- Please call her and let her know that the liver shows that there is fatty liver.  Nothing else is noted.  Gallbladder looks okay.  Kristen Murray

## 2021-07-16 ENCOUNTER — Other Ambulatory Visit: Payer: Self-pay | Admitting: Cardiovascular Disease

## 2021-07-19 ENCOUNTER — Inpatient Hospital Stay: Payer: Medicare Other

## 2021-07-19 ENCOUNTER — Other Ambulatory Visit: Payer: Self-pay

## 2021-07-19 ENCOUNTER — Telehealth: Payer: Self-pay

## 2021-07-19 DIAGNOSIS — R7989 Other specified abnormal findings of blood chemistry: Secondary | ICD-10-CM

## 2021-07-19 DIAGNOSIS — R945 Abnormal results of liver function studies: Secondary | ICD-10-CM

## 2021-07-19 DIAGNOSIS — D469 Myelodysplastic syndrome, unspecified: Secondary | ICD-10-CM | POA: Diagnosis not present

## 2021-07-19 LAB — CMP (CANCER CENTER ONLY)
ALT: 17 U/L (ref 0–44)
AST: 14 U/L — ABNORMAL LOW (ref 15–41)
Albumin: 3.8 g/dL (ref 3.5–5.0)
Alkaline Phosphatase: 82 U/L (ref 38–126)
Anion gap: 6 (ref 5–15)
BUN: 14 mg/dL (ref 8–23)
CO2: 30 mmol/L (ref 22–32)
Calcium: 9.6 mg/dL (ref 8.9–10.3)
Chloride: 102 mmol/L (ref 98–111)
Creatinine: 0.72 mg/dL (ref 0.44–1.00)
GFR, Estimated: >60 mL/min (ref >60)
Glucose, Bld: 170 mg/dL — ABNORMAL HIGH (ref 70–99)
Potassium: 4 mmol/L (ref 3.5–5.1)
Sodium: 138 mmol/L (ref 135–145)
Total Bilirubin: 0.3 mg/dL (ref 0.3–1.2)
Total Protein: 6.9 g/dL (ref 6.5–8.1)

## 2021-07-19 NOTE — Telephone Encounter (Signed)
-----   Message from [name], MD sent at 07/19/2021  2:23 PM EDT ----- Call - the liver tests are back to normal!!!  Laurey Arrow

## 2021-07-19 NOTE — Telephone Encounter (Signed)
Called and informed patient of lab results, patient verbalized understanding and denies any questions or concerns at this time.   

## 2021-08-11 ENCOUNTER — Encounter: Payer: Self-pay | Admitting: Hematology & Oncology

## 2021-08-11 ENCOUNTER — Inpatient Hospital Stay: Payer: Medicare Other

## 2021-08-11 ENCOUNTER — Inpatient Hospital Stay: Payer: Medicare Other | Attending: Hematology & Oncology

## 2021-08-11 ENCOUNTER — Other Ambulatory Visit: Payer: Self-pay

## 2021-08-11 ENCOUNTER — Inpatient Hospital Stay (HOSPITAL_BASED_OUTPATIENT_CLINIC_OR_DEPARTMENT_OTHER): Payer: Medicare Other | Admitting: Hematology & Oncology

## 2021-08-11 VITALS — BP 139/64 | HR 75 | Temp 98.3°F | Resp 20 | Wt 171.4 lb

## 2021-08-11 DIAGNOSIS — D469 Myelodysplastic syndrome, unspecified: Secondary | ICD-10-CM | POA: Insufficient documentation

## 2021-08-11 DIAGNOSIS — K909 Intestinal malabsorption, unspecified: Secondary | ICD-10-CM | POA: Insufficient documentation

## 2021-08-11 DIAGNOSIS — D508 Other iron deficiency anemias: Secondary | ICD-10-CM | POA: Diagnosis not present

## 2021-08-11 DIAGNOSIS — D4620 Refractory anemia with excess of blasts, unspecified: Secondary | ICD-10-CM | POA: Diagnosis not present

## 2021-08-11 DIAGNOSIS — R7989 Other specified abnormal findings of blood chemistry: Secondary | ICD-10-CM

## 2021-08-11 LAB — RETICULOCYTES
Immature Retic Fract: 14.2 % (ref 2.3–15.9)
RBC.: 4.29 MIL/uL (ref 3.87–5.11)
Retic Count, Absolute: 65.2 10*3/uL (ref 19.0–186.0)
Retic Ct Pct: 1.5 % (ref 0.4–3.1)

## 2021-08-11 LAB — CBC WITH DIFFERENTIAL (CANCER CENTER ONLY)
Abs Immature Granulocytes: 0.01 10*3/uL (ref 0.00–0.07)
Basophils Absolute: 0 10*3/uL (ref 0.0–0.1)
Basophils Relative: 0 %
Eosinophils Absolute: 0 10*3/uL (ref 0.0–0.5)
Eosinophils Relative: 0 %
HCT: 37.1 % (ref 36.0–46.0)
Hemoglobin: 11.8 g/dL — ABNORMAL LOW (ref 12.0–15.0)
Immature Granulocytes: 0 %
Lymphocytes Relative: 67 %
Lymphs Abs: 2.1 10*3/uL (ref 0.7–4.0)
MCH: 27.7 pg (ref 26.0–34.0)
MCHC: 31.8 g/dL (ref 30.0–36.0)
MCV: 87.1 fL (ref 80.0–100.0)
Monocytes Absolute: 0.5 10*3/uL (ref 0.1–1.0)
Monocytes Relative: 17 %
Neutro Abs: 0.5 10*3/uL — ABNORMAL LOW (ref 1.7–7.7)
Neutrophils Relative %: 16 %
Platelet Count: 176 10*3/uL (ref 150–400)
RBC: 4.26 MIL/uL (ref 3.87–5.11)
RDW: 16.8 % — ABNORMAL HIGH (ref 11.5–15.5)
WBC Count: 3.2 10*3/uL — ABNORMAL LOW (ref 4.0–10.5)
nRBC: 0 % (ref 0.0–0.2)

## 2021-08-11 LAB — LACTATE DEHYDROGENASE: LDH: 178 U/L (ref 98–192)

## 2021-08-11 LAB — CMP (CANCER CENTER ONLY)
ALT: 13 U/L (ref 0–44)
AST: 16 U/L (ref 15–41)
Albumin: 3.7 g/dL (ref 3.5–5.0)
Alkaline Phosphatase: 75 U/L (ref 38–126)
Anion gap: 9 (ref 5–15)
BUN: 15 mg/dL (ref 8–23)
CO2: 28 mmol/L (ref 22–32)
Calcium: 9.5 mg/dL (ref 8.9–10.3)
Chloride: 101 mmol/L (ref 98–111)
Creatinine: 0.59 mg/dL (ref 0.44–1.00)
GFR, Estimated: >60 mL/min (ref >60)
Glucose, Bld: 81 mg/dL (ref 70–99)
Potassium: 4 mmol/L (ref 3.5–5.1)
Sodium: 138 mmol/L (ref 135–145)
Total Bilirubin: <0.2 mg/dL — ABNORMAL LOW (ref 0.3–1.2)
Total Protein: 7.7 g/dL (ref 6.5–8.1)

## 2021-08-11 LAB — IRON AND TIBC
Iron: 21 ug/dL — ABNORMAL LOW (ref 28–170)
Saturation Ratios: 8 % — ABNORMAL LOW (ref 10.4–31.8)
TIBC: 259 ug/dL (ref 250–450)
UIBC: 238 ug/dL

## 2021-08-11 LAB — FERRITIN: Ferritin: 409 ng/mL — ABNORMAL HIGH (ref 11–307)

## 2021-08-11 NOTE — Progress Notes (Signed)
Hematology and Oncology Follow Up Visit  GAURI GALVAO [id number] 1946/07/04 75 y.o. 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 Domangue is back for follow-up.  She had a wonderful time up in [place] at the visible play of [name].  She really enjoyed.  She met family of there and they had a superb time.  She is feeling okay.  She is worried about her blood sugars.  Her blood sugar from my point of view is doing okay.  She has had no problems with bleeding.  There is no fever.  She has had a good appetite.  She has had no nausea or vomiting.  There has been no change in bowel or bladder habits.  She has had no leg swelling.  There is been no rashes.  We did do an ultrasound of her liver.  This is because some elevated liver function studies.  The ultrasound was done on 07/12/2021.  This does show some hepatic steatosis.  Overall, her performance status is ECOG 1.    Medications:  Current Outpatient Medications:    acetaminophen (TYLENOL) 325 MG tablet, Take 325-650 mg by mouth every 6 (six) hours as needed (for pain)., Disp: , Rfl:    acyclovir (ZOVIRAX) 400 MG tablet, Take 400 mg by mouth 3 (three) times daily., Disp: , Rfl:    ALPRAZolam (XANAX) 0.25 MG tablet, Take 0.25 mg by mouth 2 (two) times daily as needed., Disp: , Rfl:    apixaban (ELIQUIS) 5 MG TABS tablet, Take 1 tablet (5 mg total) by mouth 2 (two) times daily., Disp: 180 tablet, Rfl: 1   ascorbic acid (VITAMIN C) 100 MG tablet, Take 100 mg by mouth daily., Disp: , Rfl:    b complex vitamins capsule, daily., Disp: , Rfl:    Cholecalciferol 25 MCG (1000 UT) capsule, Take 1,000 Units by mouth daily., Disp: , Rfl:    diclofenac (VOLTAREN) 0.1 % ophthalmic solution, 4 (four) times  daily as needed., Disp: , Rfl:    ferrous gluconate (FERGON) 240 (27 FE) MG tablet, Take 27 mg by mouth daily., Disp: , Rfl:    fluorometholone (FML) 0.1 % ophthalmic suspension, Place 1 drop into both eyes every Monday, Wednesday, and Friday. , Disp: , Rfl:    fluticasone (FLONASE) 50 MCG/ACT nasal spray, Place 1 spray into both nostrils 2 (two) times daily., Disp: 16 g, Rfl: 11   folic acid (FOLVITE) 470 MCG tablet, daily., Disp: , Rfl:    glipiZIDE (GLUCOTROL XL) 5 MG 24 hr tablet, Take 5 mg by mouth daily., Disp: , Rfl:    glucose blood (FREESTYLE LITE) test strip, CHECK BLOOD SUGAR ONCE DAILY AS DIRECTED.  DX:790.29, Disp: 100 each, Rfl: 12   glucose blood test strip, Inject 1 strip into the skin daily., Disp: , Rfl:    hydrocortisone 1 % ointment, Apply 1 application topically 2 (two) times daily., Disp: 30 g, Rfl: 0   ipratropium (ATROVENT HFA) 17 MCG/ACT inhaler, Inhale 2 puffs into the lungs as needed., Disp: , Rfl:    ipratropium (ATROVENT) 0.03 % nasal spray, 2 sprays in each nostril, Disp: , Rfl:    Lancets (FREESTYLE) lancets, Check blood sugar once daily as directed DX:790.29 (FREESTYLE  FREEDOM LITE), Disp: 100 each, Rfl: 1   loratadine (CLARITIN) 10 MG tablet, Take 1 tablet (10 mg total) by mouth daily., Disp: 30 tablet, Rfl: 0   metoprolol tartrate (LOPRESSOR) 50 MG tablet, TAKE 1 AND 1/2 TABLETS BY  MOUTH TWICE DAILY, Disp: 270 tablet, Rfl: 1   montelukast (SINGULAIR) 10 MG tablet, Take 1 tablet (10 mg total) by mouth at bedtime., Disp: 30 tablet, Rfl: 11   Multiple Vitamin (MULTIVITAMIN) tablet, Take 1 tablet by mouth daily. Shaklee brand, Disp: , Rfl:    rosuvastatin (CRESTOR) 5 MG tablet, Take 5 mg by mouth See admin instructions. Take 5 mg by mouth at bedtime on Mondays and Thursdays..Currently on hold, Disp: , Rfl:    trolamine salicylate (ASPERCREME) 10 % cream, Apply 1 application topically as needed for muscle pain., Disp: , Rfl:    verapamil (CALAN-SR) 180 MG CR tablet,  Take 180 mg by mouth at bedtime. , Disp: , Rfl:    clotrimazole-betamethasone (LOTRISONE) cream, Apply 1 application topically 2 (two) times daily. Use for 2 weeks. (Patient not taking: Reported on 08/11/2021), Disp: 30 g, Rfl: 0   cyclobenzaprine (FLEXERIL) 10 MG tablet, Take 2.5 mg by mouth daily as needed for muscle spasms. (Patient not taking: Reported on 08/11/2021), Disp: , Rfl:    lidocaine (XYLOCAINE) 5 % ointment, Apply 1 application topically 3 (three) times daily as needed. (Patient not taking: Reported on 08/11/2021), Disp: 1.25 g, Rfl: 0   nitroGLYCERIN (NITROSTAT) 0.4 MG SL tablet, Place 1 tablet (0.4 mg total) under the tongue every 5 (five) minutes as needed for chest pain. (Patient not taking: Reported on 03/25/2021), Disp: 25 tablet, Rfl: 3   pantoprazole (PROTONIX) 40 MG tablet, Take 40 mg by mouth 2 (two) times daily. (Patient not taking: Reported on 08/11/2021), Disp: , Rfl:   Allergies:  Allergies  Allergen Reactions   Codeine Hives and Other (See Comments)    Because of a history of documented adverse serious drug reaction, Medi Alert bracelet  is recommended   Macrodantin [Nitrofurantoin Macrocrystal] Other (See Comments)    Numbness, aching all over   Other Shortness Of Breath, Rash and Other (See Comments)    Pine nuts & walnuts throat congestion. No documented angioedema. Avocado- Rash   Propoxyphene Hives, Swelling and Rash   Propoxyphene Hcl Hives, Swelling and Rash   Latex Rash   Neosporin [Bacitracin-Polymyxin B] Other (See Comments)    Blisters   Avocado Rash   Banana Rash   Ciprofloxacin Nausea Only and Other (See Comments)    Nausea (Note: Patient takes this when on mission trips, however)   Tizanidine Other (See Comments)    Reaction not recalled   Tramadol Nausea Only    Past Medical History, Surgical history, Social history, and Family History were reviewed and updated.  Review of Systems: Review of Systems  Constitutional: Negative.   HENT:   Negative.    Eyes: Negative.   Respiratory: Negative.    Cardiovascular: Negative.   Gastrointestinal: Negative.   Endocrine: Negative.   Genitourinary: Negative.    Musculoskeletal: Negative.   Skin: Negative.   Neurological: Negative.   Hematological: Negative.   Psychiatric/Behavioral: Negative.     Physical Exam:  weight is 171 lb 6.4 oz (77.7 kg). Her oral temperature is 98.3 F (36.8 C). Her blood pressure is 139/64 and her pulse is 75. Her respiration is 20 and oxygen saturation is 100%.   Wt Readings from Last 3 Encounters:  08/11/21 171 lb 6.4 oz (  77.7 kg)  07/05/21 175 lb 12.8 oz (79.7 kg)  06/03/21 171 lb (77.6 kg)    Physical Exam Vitals reviewed.  HENT:     Head: Normocephalic and atraumatic.  Eyes:     Pupils: Pupils are equal, round, and reactive to light.  Cardiovascular:     Rate and Rhythm: Normal rate and regular rhythm.     Heart sounds: Normal heart sounds.  Pulmonary:     Effort: Pulmonary effort is normal.     Breath sounds: Normal breath sounds.  Abdominal:     General: Bowel sounds are normal.     Palpations: Abdomen is soft.  Musculoskeletal:        General: No tenderness or deformity. Normal range of motion.     Cervical back: Normal range of motion.     Comments: There is moderate swelling of the left calf area.  This is somewhat tender to palpation.  There is no redness.  I cannot palpate any obvious venous cord.  There is no pain behind the left knee.  She has decent pulses in her distal extremities.  Lymphadenopathy:     Cervical: No cervical adenopathy.  Skin:    General: Skin is warm and dry.     Findings: No erythema or rash.  Neurological:     Mental Status: She is alert and oriented to person, place, and time.  Psychiatric:        Behavior: Behavior normal.        Thought Content: Thought content normal.        Judgment: Judgment normal.     Lab Results  Component Value Date   WBC 3.2 (L) 08/11/2021   HGB 11.8 (L)  08/11/2021   HCT 37.1 08/11/2021   MCV 87.1 08/11/2021   PLT 176 08/11/2021     Chemistry      Component Value Date/Time   NA 138 08/11/2021 1450   NA 138 03/10/2020 1138   K 4.0 08/11/2021 1450   CL 101 08/11/2021 1450   CO2 28 08/11/2021 1450   BUN 15 08/11/2021 1450   BUN 10 03/10/2020 1138   CREATININE 0.59 08/11/2021 1450      Component Value Date/Time   CALCIUM 9.5 08/11/2021 1450   ALKPHOS 75 08/11/2021 1450   AST 16 08/11/2021 1450   ALT 13 08/11/2021 1450   BILITOT <0.2 (L) 08/11/2021 1450       Impression and Plan: Ms. Rasmus is a 75 year old African-American female.   Looks like she does have a low-grade myelodysplasia.  Again she has a very low IPSS-R score.  I guess I should not be surprised by the steatosis.  Her liver function studies are pretty much normal right now.  She does not need any Aranesp.  We will try to move her appointments out little bit longer.  I would like to see her back right before Thanksgiving.  If we do this, then I think we can get her through the Christmas and New Year's holiday.    [name], MD 10/13/20224:56 PM

## 2021-08-12 ENCOUNTER — Telehealth: Payer: Self-pay | Admitting: *Deleted

## 2021-08-12 NOTE — Telephone Encounter (Signed)
Per (result)  message Kristen Murray called to schedule (4) doses of IV Iron. Patient will be calling back Monday to schedule. She did not have her calendar in front of her.

## 2021-08-19 ENCOUNTER — Inpatient Hospital Stay: Payer: Medicare Other

## 2021-08-19 ENCOUNTER — Other Ambulatory Visit: Payer: Self-pay

## 2021-08-19 VITALS — BP 123/59 | HR 76 | Temp 98.2°F | Resp 18

## 2021-08-19 DIAGNOSIS — D72819 Decreased white blood cell count, unspecified: Secondary | ICD-10-CM

## 2021-08-19 DIAGNOSIS — D500 Iron deficiency anemia secondary to blood loss (chronic): Secondary | ICD-10-CM

## 2021-08-19 DIAGNOSIS — D4620 Refractory anemia with excess of blasts, unspecified: Secondary | ICD-10-CM

## 2021-08-19 DIAGNOSIS — D469 Myelodysplastic syndrome, unspecified: Secondary | ICD-10-CM | POA: Diagnosis not present

## 2021-08-19 MED ORDER — SODIUM CHLORIDE 0.9 % IV SOLN
200.0000 mg | Freq: Once | INTRAVENOUS | Status: AC
  Administered 2021-08-19: 200 mg via INTRAVENOUS
  Filled 2021-08-19: qty 200

## 2021-08-19 MED ORDER — SODIUM CHLORIDE 0.9 % IV SOLN: INTRAVENOUS | Status: DC

## 2021-08-19 NOTE — Patient Instructions (Signed)

## 2021-08-26 ENCOUNTER — Other Ambulatory Visit: Payer: Self-pay

## 2021-08-26 ENCOUNTER — Inpatient Hospital Stay: Payer: Medicare Other

## 2021-08-26 VITALS — BP 144/64 | HR 85 | Temp 98.3°F | Resp 20

## 2021-08-26 DIAGNOSIS — D500 Iron deficiency anemia secondary to blood loss (chronic): Secondary | ICD-10-CM

## 2021-08-26 DIAGNOSIS — D469 Myelodysplastic syndrome, unspecified: Secondary | ICD-10-CM | POA: Diagnosis not present

## 2021-08-26 DIAGNOSIS — D4620 Refractory anemia with excess of blasts, unspecified: Secondary | ICD-10-CM

## 2021-08-26 DIAGNOSIS — D72819 Decreased white blood cell count, unspecified: Secondary | ICD-10-CM

## 2021-08-26 MED ORDER — SODIUM CHLORIDE 0.9 % IV SOLN: INTRAVENOUS | Status: DC

## 2021-08-26 MED ORDER — SODIUM CHLORIDE 0.9 % IV SOLN
200.0000 mg | Freq: Once | INTRAVENOUS | Status: AC
  Administered 2021-08-26: 200 mg via INTRAVENOUS
  Filled 2021-08-26: qty 200

## 2021-08-26 NOTE — Patient Instructions (Signed)

## 2021-08-31 ENCOUNTER — Ambulatory Visit: Payer: Medicare Other | Admitting: Cardiovascular Disease

## 2021-08-31 ENCOUNTER — Other Ambulatory Visit: Payer: Self-pay

## 2021-08-31 ENCOUNTER — Encounter: Payer: Self-pay | Admitting: Cardiovascular Disease

## 2021-08-31 VITALS — BP 118/64 | HR 72 | Ht 65.0 in | Wt 171.2 lb

## 2021-08-31 DIAGNOSIS — I4892 Unspecified atrial flutter: Secondary | ICD-10-CM

## 2021-08-31 NOTE — Patient Instructions (Signed)
Medication Instructions:  Your physician recommends that you continue on your current medications as directed. Please refer to the Current Medication list given to you today.  *If you need a refill on your cardiac medications before your next appointment, please call your pharmacy*   Lab Work: None If you have labs (blood work) drawn today and your tests are completely normal, you will receive your results only by: [place] (if you have MyChart) OR A paper copy in the mail If you have any lab test that is abnormal or we need to change your treatment, we will call you to review the results.   Testing/Procedures: None   Follow-Up: At [facility], you and your health needs are our priority.  As part of our continuing mission to provide you with exceptional heart care, we have created designated Provider Care Teams.  These Care Teams include your primary Cardiologist (physician) and Advanced Practice Providers (APPs -  Physician Assistants and Nurse Practitioners) who all work together to provide you with the care you need, when you need it.  We recommend signing up for the patient portal called "MyChart".  Sign up information is provided on this After Visit Summary.  MyChart is used to connect with patients for Virtual Visits (Telemedicine).  Patients are able to view lab/test results, encounter notes, upcoming appointments, etc.  Non-urgent messages can be sent to your provider as well.   To learn more about what you can do with MyChart, go to [email or web address].    Your next appointment:   1 year(s)  The format for your next appointment:   In Person  Provider:   Richardson Dopp, PA-C   Other Instructions

## 2021-08-31 NOTE — Progress Notes (Signed)
Cardiology Office Note:    Date:  08/31/2021   ID:  Kristen Murray, DOB February 18, 1946, MRN [id number]  PCP:  [name], MD  [facility] Cardiologist:  Mertie Moores, MD  [facility] Electrophysiologist:  None   Referring MD: [name], MD   Chief Complaint  Patient presents with   Hypertension         Previous notes.:    Kristen Murray is a 75 y.o. female with a hx of diabetes mellitus, reflux, hyperlipidemia, hypertension and recent diagnosis of atrial flutter.  I met her during the hospitalization in March, 2021 when she presented with atrial flutter. Echocardiogram at that time reveals normal left ventricular systolic function.  There was no significant valvular abnormalities.  Lexiscan Myoview study performed on January 30, 2020 reveals normal left ventricular systolic function with no evidence of ischemia.  Troponin levels were normal   Has had some palpitations Has rare episodes of jaw pan .   Is exercising in her house .   Has not gone outside much   February 14, 2021:  Seen for follow up of her atrial flutter  Has palpitations in the am ,  Is wearing an event monitor .  Does not sleep well at night.   Wonders if she has OSA .   She was having some jaw pain and ches pain  She had a coronary CT angiogram.  She has a coronary calcium score of 1 which places her in the 38th percentile for age and sex matched controls.  The left main is normal The left anterior descending artery is a large vessel with no plaque The left circumflex artery is a nondominant vessel that gives rise to a large OM1 in both the left circumflex and the obtuse marginal arteries are normal. The right coronary artery has very mild plaque in the mid right coronary artery (0 to 24% stenosis).   Nov. 2, 2022: Kristen Murray is seen for follow up of her atrial flutter  Cp Coronary CT angio shows minimal CAD   Feeling well Has had lots of fatigue,   has iron infusions periodically  Sees hematology    Stil has some atypical CP  Complains of being irritable in the afternoon   Past Medical History:  Diagnosis Date   Arthritis    Borderline abnormal TFTs    as a teen   CAD (coronary artery disease)    Coronary CTA 3/22: Calcium score 1 (38th percentile), minimal nonobstructive CAD with 0-24% in mid RCA; aortic atherosclerosis   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   Diabetes mellitus without complication ([place])    E. 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    HTN (hypertension)    Hyperglycemia    Hyperlipidemia    Meningioma (HCC)    Myelodysplasia, low grade ([place]) 11/18/2020   Nephrolithiasis    Dr Amalia Hailey, [facility], Hx of [3][   Polyp of colon    Radiculopathy     Past Surgical History:  Procedure Laterality Date   ABDOMINAL HYSTERECTOMY     TAH/BSO --fibroids, no cancer   BALLOON DILATION N/A 07/20/2014   Procedure: BALLOON DILATION;  Surgeon: Garlan Fair, MD;  Location: [facility] ENDOSCOPY;  Service: Endoscopy;  Laterality: N/A;   BREAST  BIOPSY     Right   CARPAL TUNNEL RELEASE     & trigger thumb RUE; Dr Alphonzo Cruise   COLONOSCOPY W/ POLYPECTOMY  07/2004   Neg 2011; Dr Earle Gell   CYSTOSCOPY  11/2009   Neg   ESOPHAGOGASTRODUODENOSCOPY N/A 07/20/2014   Procedure: ESOPHAGOGASTRODUODENOSCOPY (EGD);  Surgeon: Garlan Fair, MD;  Location: [facility];  Service: Endoscopy;  Laterality: N/A;   FLEXIBLE BRONCHOSCOPY W/ UPPER ENDOSCOPY     neg   KNEE ARTHROSCOPY     Left   ROTATOR CUFF REPAIR     R shoulder   TONSILLECTOMY     TRIGGER FINGER RELEASE Right    Right Thumb    Current Medications: Current Meds  Medication Sig   acetaminophen (TYLENOL) 325 MG tablet Take 325-650 mg by mouth every 6 (six) hours as needed (for pain).   acyclovir (ZOVIRAX) 400  MG tablet Take 400 mg by mouth 3 (three) times daily.   ALPRAZolam (XANAX) 0.25 MG tablet Take 0.25 mg by mouth 2 (two) times daily as needed.   apixaban (ELIQUIS) 5 MG TABS tablet Take 1 tablet (5 mg total) by mouth 2 (two) times daily.   ascorbic acid (VITAMIN C) 100 MG tablet Take 100 mg by mouth daily.   b complex vitamins capsule daily.   Cholecalciferol 25 MCG (1000 UT) capsule Take 1,000 Units by mouth daily.   cyclobenzaprine (FLEXERIL) 10 MG tablet Take 2.5 mg by mouth daily as needed for muscle spasms.   diclofenac (VOLTAREN) 0.1 % ophthalmic solution 4 (four) times daily as needed.   ferrous gluconate (FERGON) 240 (27 FE) MG tablet Take 27 mg by mouth daily.   fluorometholone (FML) 0.1 % ophthalmic suspension Place 1 drop into both eyes every Monday, Wednesday, and Friday.    fluticasone (FLONASE) 50 MCG/ACT nasal spray Place 1 spray into both nostrils 2 (two) times daily.   folic acid (FOLVITE) 332 MCG tablet daily.   glipiZIDE (GLUCOTROL XL) 5 MG 24 hr tablet Take 5 mg by mouth daily.   glucose blood (FREESTYLE LITE) test strip CHECK BLOOD SUGAR ONCE DAILY AS DIRECTED.  DX:790.29   glucose blood test strip Inject 1 strip into the skin daily.   hydrocortisone 1 % ointment Apply 1 application topically 2 (two) times daily.   ipratropium (ATROVENT HFA) 17 MCG/ACT inhaler Inhale 2 puffs into the lungs as needed.   ipratropium (ATROVENT) 0.03 % nasal spray 2 sprays in each nostril   Lancets (FREESTYLE) lancets Check blood sugar once daily as directed DX:790.29 (FREESTYLE FREEDOM LITE)   lidocaine (XYLOCAINE) 5 % ointment Apply 1 application topically 3 (three) times daily as needed.   loratadine (CLARITIN) 10 MG tablet Take 1 tablet (10 mg total) by mouth daily.   metoprolol tartrate (LOPRESSOR) 50 MG tablet TAKE 1 AND 1/2 TABLETS BY  MOUTH TWICE DAILY   montelukast (SINGULAIR) 10 MG tablet Take 1 tablet (10 mg total) by mouth at bedtime.   Multiple Vitamin (MULTIVITAMIN) tablet Take 1  tablet by mouth daily. Shaklee brand   pantoprazole (PROTONIX) 40 MG tablet Take 40 mg by mouth 2 (two) times daily.   rosuvastatin (CRESTOR) 5 MG tablet Take 5 mg by mouth See admin instructions. Take 5 mg by mouth at bedtime on Mondays and Thursdays..Currently on hold   trolamine salicylate (ASPERCREME) 10 % cream Apply 1 application topically as needed for muscle pain.   verapamil (CALAN-SR) 180 MG CR tablet Take 180 mg by mouth at bedtime.  Allergies:   Codeine, Macrodantin [nitrofurantoin macrocrystal], Other, Propoxyphene, Propoxyphene hcl, Latex, Dulaglutide, Metformin hcl, Neosporin [bacitracin-polymyxin b], Avocado, Banana, Ciprofloxacin, Tizanidine, and Tramadol   Social History   Socioeconomic History   Marital status: Married    Spouse name: Not on file   Number of children: Not on file   Years of education: Not on file   Highest education level: Not on file  Occupational History   Occupation: Estate manager/land agent: [employer]  Tobacco Use   Smoking status: Never   Smokeless tobacco: Never  Vaping Use   Vaping Use: Never used  Substance and Sexual Activity   Alcohol use: No    Alcohol/week: 0.0 standard drinks   Drug use: No   Sexual activity: Not Currently    Birth control/protection: Post-menopausal    Comment: 1st intercourse- 20  partners - 1  Other Topics Concern   Not on file  Social History Narrative   Regular Exercise-no         Social Determinants of Health   Financial Resource Strain: Not on file  Food Insecurity: Not on file  Transportation Needs: Not on file  Physical Activity: Not on file  Stress: Not on file  Social Connections: Not on file     Family History: The patient's family history includes Alcohol abuse in her mother; Diabetes in her maternal aunt, maternal grandmother, and maternal uncle; Other in her father; Tuberculosis in her maternal uncle. There is no history of Coronary artery disease.  Kristen Murray:   Please  see the history of present illness.     All other systems reviewed and are negative.  EKGs/Labs/Other Studies Reviewed:    The following studies were reviewed today:   EKG:     Recent Labs: 08/11/2021: ALT 13; BUN 15; Creatinine 0.59; Hemoglobin 11.8; Platelet Count 176; Potassium 4.0; Sodium 138  Recent Lipid Panel    Component Value Date/Time   CHOL 164 01/27/2020 1835   TRIG 85 01/27/2020 1835   HDL 44 01/27/2020 1835   CHOLHDL 3.7 01/27/2020 1835   VLDL 17 01/27/2020 1835   LDLCALC 103 (H) 01/27/2020 1835    Physical Exam:    Physical Exam: Blood pressure 118/64, pulse 72, height $RemoveBe'5\' 5"'[id number]$  (1.651 m), weight 171 lb 3.2 oz (77.7 kg), last menstrual period 07/30/1990, SpO2 97 %.  GEN:  Well nourished, well developed in no acute distress HEENT: Normal NECK: No JVD; No carotid bruits LYMPHATICS: No lymphadenopathy CARDIAC: RRR , no murmurs, rubs, gallops RESPIRATORY:  Clear to auscultation without rales, wheezing or rhonchi  ABDOMEN: Soft, non-tender, non-distended MUSCULOSKELETAL:  No edema; No deformity  SKIN: Warm and dry NEUROLOGIC:  Alert and oriented x 3     ASSESSMENT:    1. Atrial flutter, paroxysmal (HCC)    PLAN:      Atrial Flutter:     :   clinically , she has maintained NSR ,  Cont eliquis   2.  Hypertension:    BP is well controlled.   3.  Hyperlipidemia:   - managed by her primary md   4.  Chest pain :  Coronary CT angio showed minimal CAD .  Marland Kitchen    Still has occasional CP ,  sounds like MSK pain at this point .   Medication Adjustments/Labs and Tests Ordered: Current medicines are reviewed at length with the patient today.  Concerns regarding medicines are outlined above.  No orders of the defined types were placed in this encounter.  No orders of the defined types were placed in this encounter.   Patient Instructions  Medication Instructions:  Your physician recommends that you continue on your current medications as directed. Please  refer to the Current Medication list given to you today.  *If you need a refill on your cardiac medications before your next appointment, please call your pharmacy*   Lab Work: None If you have labs (blood work) drawn today and your tests are completely normal, you will receive your results only by: [place] (if you have MyChart) OR A paper copy in the mail If you have any lab test that is abnormal or we need to change your treatment, we will call you to review the results.   Testing/Procedures: None   Follow-Up: At [facility], you and your health needs are our priority.  As part of our continuing mission to provide you with exceptional heart care, we have created designated Provider Care Teams.  These Care Teams include your primary Cardiologist (physician) and Advanced Practice Providers (APPs -  Physician Assistants and Nurse Practitioners) who all work together to provide you with the care you need, when you need it.  We recommend signing up for the patient portal called "MyChart".  Sign up information is provided on this After Visit Summary.  MyChart is used to connect with patients for Virtual Visits (Telemedicine).  Patients are able to view lab/test results, encounter notes, upcoming appointments, etc.  Non-urgent messages can be sent to your provider as well.   To learn more about what you can do with MyChart, go to [email or web address].    Your next appointment:   1 year(s)  The format for your next appointment:   In Person  Provider:   Richardson Dopp, PA-C   Other Instructions     Signed, Mertie Moores, MD  08/31/2021 11:33 AM    [place]

## 2021-09-02 ENCOUNTER — Inpatient Hospital Stay: Payer: Medicare Other | Attending: Hematology & Oncology

## 2021-09-02 ENCOUNTER — Other Ambulatory Visit: Payer: Self-pay

## 2021-09-02 VITALS — BP 136/73 | HR 81 | Temp 98.2°F | Resp 18

## 2021-09-02 DIAGNOSIS — K909 Intestinal malabsorption, unspecified: Secondary | ICD-10-CM | POA: Diagnosis present

## 2021-09-02 DIAGNOSIS — D500 Iron deficiency anemia secondary to blood loss (chronic): Secondary | ICD-10-CM

## 2021-09-02 DIAGNOSIS — D508 Other iron deficiency anemias: Secondary | ICD-10-CM | POA: Diagnosis present

## 2021-09-02 DIAGNOSIS — D4620 Refractory anemia with excess of blasts, unspecified: Secondary | ICD-10-CM

## 2021-09-02 DIAGNOSIS — D72819 Decreased white blood cell count, unspecified: Secondary | ICD-10-CM

## 2021-09-02 MED ORDER — SODIUM CHLORIDE 0.9 % IV SOLN
200.0000 mg | Freq: Once | INTRAVENOUS | Status: AC
  Administered 2021-09-02: 200 mg via INTRAVENOUS
  Filled 2021-09-02: qty 200

## 2021-09-02 MED ORDER — EPOETIN ALFA-EPBX 40000 UNIT/ML IJ SOLN: 40000.0000 [IU] | Freq: Once | INTRAMUSCULAR | Status: DC

## 2021-09-02 MED ORDER — SODIUM CHLORIDE 0.9 % IV SOLN: Freq: Once | INTRAVENOUS | Status: DC | PRN

## 2021-09-02 NOTE — Patient Instructions (Signed)

## 2021-09-09 ENCOUNTER — Inpatient Hospital Stay: Payer: Medicare Other

## 2021-09-09 ENCOUNTER — Other Ambulatory Visit: Payer: Self-pay

## 2021-09-09 VITALS — BP 136/64 | HR 80 | Temp 98.6°F | Resp 18

## 2021-09-09 DIAGNOSIS — D72819 Decreased white blood cell count, unspecified: Secondary | ICD-10-CM

## 2021-09-09 DIAGNOSIS — D508 Other iron deficiency anemias: Secondary | ICD-10-CM | POA: Diagnosis not present

## 2021-09-09 DIAGNOSIS — D4620 Refractory anemia with excess of blasts, unspecified: Secondary | ICD-10-CM

## 2021-09-09 DIAGNOSIS — D500 Iron deficiency anemia secondary to blood loss (chronic): Secondary | ICD-10-CM

## 2021-09-09 MED ORDER — SODIUM CHLORIDE 0.9 % IV SOLN
200.0000 mg | Freq: Once | INTRAVENOUS | Status: AC
  Administered 2021-09-09: 200 mg via INTRAVENOUS
  Filled 2021-09-09: qty 200

## 2021-09-09 MED ORDER — SODIUM CHLORIDE 0.9 % IV SOLN: Freq: Once | INTRAVENOUS | Status: AC

## 2021-09-09 NOTE — Patient Instructions (Signed)

## 2021-10-02 NOTE — Patient Instructions (Addendum)
  1. Continue avoidance - tree nuts, pine nuts, banana, avocado  2. Continue to Treat and prevent inflammation:   A. Flonase 1-2 sprays each nostril one time per day. Do not use this for about 3 days due to seeing some blood after you blew your nose last night. Consider changing to a different nasal steroid nasal spray once you finish your 90 day supply of Flonase.  B. montelukast 10 mg tablet once a day  3. Continue to Treat LPR:   A. Continue to consolidate all caffeine and chocolate use  B. Continue to replace throat clearing with swallowing maneuver    4. If needed:   A. nasal saline  B. OTC antihistamine - Cetirizine (Zyrtec)  C. Epi-Pen. Recommend picking this up from the pharmacy.  D. Can add OTC Mucinex two times per day  E.  Ipratropium 0.06% 1-2 sprays each nostril every 6 hours to dry nose. First try increasing your ipratropium bromide nasal spray to 2 sprays each nostril twice a day as needed for runny nose/drainage down throat. Caution as this can be drying.  5. Obtain fall flu vaccine  6. Return to clinic in 6 months or earlier if problem

## 2021-10-03 ENCOUNTER — Encounter: Payer: Self-pay | Admitting: Family

## 2021-10-03 ENCOUNTER — Ambulatory Visit (INDEPENDENT_AMBULATORY_CARE_PROVIDER_SITE_OTHER): Payer: Medicare Other | Admitting: Family

## 2021-10-03 ENCOUNTER — Other Ambulatory Visit: Payer: Self-pay

## 2021-10-03 VITALS — BP 130/56 | HR 74 | Temp 97.2°F | Resp 16 | Ht 63.7 in | Wt 174.0 lb

## 2021-10-03 DIAGNOSIS — J3089 Other allergic rhinitis: Secondary | ICD-10-CM | POA: Diagnosis not present

## 2021-10-03 DIAGNOSIS — T7805XD Anaphylactic reaction due to tree nuts and seeds, subsequent encounter: Secondary | ICD-10-CM

## 2021-10-03 DIAGNOSIS — K219 Gastro-esophageal reflux disease without esophagitis: Secondary | ICD-10-CM

## 2021-10-03 MED ORDER — EPINEPHRINE 0.3 MG/0.3ML IJ SOAJ: 0.3000 mg | INTRAMUSCULAR | 1 refills | Status: DC | PRN

## 2021-10-03 NOTE — Progress Notes (Signed)
[address] [place] [address] Dept: [phone]  FOLLOW UP NOTE  Patient ID: Kristen Murray, female    DOB: 05/08/1946  Age: 75 y.o. MRN: [id number] Date of Office Visit: 10/03/2021  Assessment  Chief Complaint: Other (Nasal drainage (clear) and some sneezing. Last night left nostril was bleeding. As soon as she gets up in the morning she is reaching for a tissue.) and Nasal Congestion  HPI Kristen Murray is a 75 year old female who presents today for an acute visit.  She was last seen on May 24, 2021 by Dr. 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  Allergic rhinitis is reported as not well controlled with saline rinses 3 times a week, Singulair 10 mg once a day, Zyrtec 10 mg once a day, ipratropium nasal spray 0.06% 1 spray each nostril twice a day, and Flonase 1 to 2 sprays each nostril once a day.  She reports for the past month she has had clear rhinorrhea and constantly reaching for Kleenex's.  She also has postnasal drip, nasal congestion, and off and on left earache.  She denies fever, chills, or sore throat.  She mentions that if she does a sinus rinse 3 days in a row, when she blows her nose she will see blood.  She reports this happened last night.  She reports that in the past when she saw Dr. Smitty Pluck he used to give her a shot, but her symptoms were much worse then.  She has not had any sinus infections since we last saw her.  She mentions that she just received 3 bottles of fluticasone nasal spray from [facility].  She would like to hold off on trying a different nasal steroid spray.  She reports an occasional dry cough that she feels comes from her throat. She feels like it could be due to drainage. When her cough used to be worse  a friend gave her some over the counter creomulsion. She is no longer taking this medication.  Laryngopharyngeal reflux disease is reported as controlled with no  medication at this time.  She reports that she stopped taking pantoprazole because "she is getting smarter in her old age".  She does not feel like it is worth it to take it.  She is already taking enough pills.  She hardly eats any chocolate candy and drinks 1 cup of decaffeinated coffee in the morning.  She is trying to drink more water and watch what she eats.  She denies any heartburn or reflux symptoms.  She continues to avoid tree nuts, pine nuts, banana, and avocado without any accidental ingestion or use of her EpiPen.  She reports that her EpiPen is not up-to-date and she would rather not buy another one.  Her previous one cost her $30 and she did not ever use it.  She mentions that he recently she has felt a slight fine rash on her neck.  She reports that it feels like baby measles.  It is not itchy or painful and does not ooze or bleed.  She has not tried anything for it.  She just feels it.    Drug Allergies:  Allergies  Allergen Reactions   Codeine Hives and Other (See Comments)    Because of a history of documented adverse serious drug reaction, Medi Alert bracelet  is recommended   Macrodantin [Nitrofurantoin Macrocrystal] Other (See Comments)    Numbness, aching all over   Other  Shortness Of Breath, Rash and Other (See Comments)    Pine nuts & walnuts throat congestion. No documented angioedema. Avocado- Rash   Propoxyphene Hives, Swelling and Rash   Propoxyphene Hcl Hives, Swelling and Rash   Latex Rash   Dulaglutide     Other reaction(s): weak, ha, no appetite   Metformin Hcl     Other reaction(s): leg discomfort   Neosporin [Bacitracin-Polymyxin B] Other (See Comments)    Blisters   Avocado Rash   Banana Rash   Ciprofloxacin Nausea Only and Other (See Comments)    Nausea (Note: Patient takes this when on mission trips, however)   Tizanidine Other (See Comments)    Reaction not recalled   Tramadol Nausea Only    Review of Systems: Review of Systems   Constitutional:  Negative for chills and fever.  Eyes:        Denies itchy watery eyes  Respiratory:  Positive for cough. Negative for shortness of breath and wheezing.        Reports occasional slight dry cough that she feels comes from her throat.  She feels that it could be drainage.  She does not feel like anything is stuck in her throat anymore like it used to get used to.  Cardiovascular:  Negative for chest pain and palpitations.  Gastrointestinal:        Denies heartburn or reflux symptoms.  No longer taking pantoprazole 40 mg once a day  Genitourinary:  Positive for frequency.       Reports getting up 3-4 times a night to go to urinate  Skin:  Positive for rash.       Reports that she feels little bumps on her neck.  They are not itchy or painful.  Neurological:  Negative for headaches.  Endo/Heme/Allergies:  Positive for environmental allergies.    Physical Exam: BP (!) 130/56   Pulse 74   Temp (!) 97.2 F (36.2 C) (Temporal)   Resp 16   Ht 5' 3.7" (1.618 m)   Wt 174 lb (78.9 kg)   LMP 07/30/1990   SpO2 96%   BMI 30.15 kg/m    Physical Exam Constitutional:      Appearance: Normal appearance.  HENT:     Head: Normocephalic and atraumatic.     Comments: Pharynx normal, eyes normal, ears: Unable to see left tympanic membrane due to cerumen.  Right ear normal.  Nose: Bilateral lower turbinates moderately edematous and slightly erythematous with no drainage noted.    Right Ear: Tympanic membrane, ear canal and external ear normal.     Left Ear: Ear canal and external ear normal.     Mouth/Throat:     Mouth: Mucous membranes are moist.     Pharynx: Oropharynx is clear.  Eyes:     Conjunctiva/sclera: Conjunctivae normal.  Cardiovascular:     Rate and Rhythm: Regular rhythm.     Heart sounds: Normal heart sounds.  Pulmonary:     Effort: Pulmonary effort is normal.     Breath sounds: Normal breath sounds.     Comments: Lungs clear to auscultation Skin:    General:  Skin is warm.     Comments: Tiny flesh-colored bumps noted on neck with no erythema.  Neurological:     Mental Status: She is alert and oriented to person, place, and time.  Psychiatric:        Mood and Affect: Mood normal.        Behavior: Behavior normal.  Thought Content: Thought content normal.        Judgment: Judgment normal.    Diagnostics: None  Assessment and Plan: 1. Other allergic rhinitis   2. LPRD (laryngopharyngeal reflux disease)   3. 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    Dispense:  1 each    Refill:  1     Patient Instructions   1. Continue avoidance - tree nuts, pine nuts, banana, avocado  2. Continue to Treat and prevent inflammation:   A. Flonase 1-2 sprays each nostril one time per day. Do not use this for about 3 days due to seeing some blood after you blew your nose last night. Consider changing to a different nasal steroid nasal spray once you finish your 90 day supply of Flonase.  B. montelukast 10 mg tablet once a day  3. Continue to Treat LPR:   A. Continue to consolidate all caffeine and chocolate use  B. Continue to replace throat clearing with swallowing maneuver    4. If needed:   A. nasal saline  B. OTC antihistamine - Cetirizine (Zyrtec)  C. Epi-Pen. Recommend picking this up from the pharmacy.  D. Can add OTC Mucinex two times per day  E.  Ipratropium 0.06% 1-2 sprays each nostril every 6 hours to dry nose. First try increasing your ipratropium bromide nasal spray to 2 sprays each nostril twice a day as needed for runny nose/drainage down throat. Caution as this can be drying.  5. Obtain fall flu vaccine  6. Return to clinic in 6 months or earlier if problem        Return in about 6 months (around 04/03/2022), or if symptoms worsen or  fail to improve.    Thank you for the opportunity to care for this patient.  Please do not hesitate to contact me with questions.  Althea Charon, FNP [facility] [place]

## 2021-11-04 DIAGNOSIS — T148XXA Other injury of unspecified body region, initial encounter: Secondary | ICD-10-CM | POA: Diagnosis not present

## 2021-11-15 ENCOUNTER — Other Ambulatory Visit (HOSPITAL_COMMUNITY): Payer: Self-pay

## 2021-11-15 ENCOUNTER — Encounter: Payer: Self-pay | Admitting: Family

## 2021-11-18 ENCOUNTER — Encounter: Payer: Self-pay | Admitting: Family

## 2021-11-22 ENCOUNTER — Other Ambulatory Visit: Payer: Self-pay

## 2021-11-22 ENCOUNTER — Encounter: Payer: Self-pay | Admitting: Cardiovascular Disease

## 2021-11-22 ENCOUNTER — Other Ambulatory Visit: Payer: Self-pay | Admitting: Pharmacist

## 2021-11-22 ENCOUNTER — Ambulatory Visit: Payer: Medicare HMO | Admitting: Cardiovascular Disease

## 2021-11-22 VITALS — BP 138/68 | HR 69 | Ht 65.0 in | Wt 173.2 lb

## 2021-11-22 DIAGNOSIS — I2510 Atherosclerotic heart disease of native coronary artery without angina pectoris: Secondary | ICD-10-CM

## 2021-11-22 DIAGNOSIS — I10 Essential (primary) hypertension: Secondary | ICD-10-CM

## 2021-11-22 DIAGNOSIS — I4892 Unspecified atrial flutter: Secondary | ICD-10-CM | POA: Diagnosis not present

## 2021-11-22 NOTE — Progress Notes (Signed)
Cardiology Office Note:    Date:  11/22/2021   ID:  Kristen Murray, DOB [birth year], MRN [id number]  PCP:  [name], MD  [facility] Cardiologist:  Mertie Moores, MD  [place] Electrophysiologist:  None   Referring MD: [name], MD   No chief complaint on file.   Previous notes.:    Kristen Murray is a 76 y.o. female with a hx of diabetes mellitus, reflux, hyperlipidemia, hypertension and recent diagnosis of atrial flutter.  I met her during the hospitalization in March, 2021 when she presented with atrial flutter. Echocardiogram at that time reveals normal left ventricular systolic function.  There was no significant valvular abnormalities.  Lexiscan Myoview study performed on January 30, 2020 reveals normal left ventricular systolic function with no evidence of ischemia.  Troponin levels were normal   Has had some palpitations Has rare episodes of jaw pan .   Is exercising in her house .   Has not gone outside much   February 14, 2021:  Seen for follow up of her atrial flutter  Has palpitations in the am ,  Is wearing an event monitor .  Does not sleep well at night.   Wonders if she has OSA .   She was having some jaw pain and ches pain  She had a coronary CT angiogram.  She has a coronary calcium score of 1 which places her in the 38th percentile for age and sex matched controls.  The left main is normal The left anterior descending artery is a large vessel with no plaque The left circumflex artery is a nondominant vessel that gives rise to a large OM1 in both the left circumflex and the obtuse marginal arteries are normal. The right coronary artery has very mild plaque in the mid right coronary artery (0 to 24% stenosis).   Nov. 2, 2022: Kristen Murray is seen for follow up of her atrial flutter  Cp Coronary CT angio shows minimal CAD   Feeling well Has had lots of fatigue,   has iron infusions periodically  Sees hematology   Stil has some atypical CP  Complains  of being irritable in the afternoon   Jan. 24, 2023 Kristen Murray is seen today for follow up of her atrial flutter , HTN Has had more shortness of breath - especially with talking  Occasional DOE walking in from the store .  Several nights ago, she had a sudden pain under her left breast, radiated to her toes then resolved .  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    HTN (hypertension)    Hyperglycemia    Hyperlipidemia    Meningioma (HCC)    Myelodysplasia, low grade ([place]) 11/18/2020   Nephrolithiasis    Dr Amalia Hailey, [facility], Hx of [3][  Polyp of colon    Radiculopathy     Past Surgical History:  Procedure Laterality Date   ABDOMINAL HYSTERECTOMY     TAH/BSO --fibroids, no cancer   BALLOON DILATION N/A 07/20/2014   Procedure: BALLOON DILATION;  Surgeon: Garlan Fair, MD;  Location: [facility] END[facility];  Service: Endoscopy;  Laterality: N/A;   BREAST BIOPSY     Right   CARPAL TUNNEL RELEASE     & trigger thumb RUE; Dr Alphonzo Cruise   COLONOSCOPY W/ POLYPECTOMY  07/2004   Neg 2011; Dr Earle Gell   CYSTOSCOPY  11/2009   Neg   ESOPHAGOGASTRODUODENOSCOPY N/A 07/20/2014   Procedure: ESOPHAGOGASTRODUODENOSCOPY (EGD);  Surgeon: Garlan Fair, MD;  Location: [facility];  Service: Endoscopy;  Laterality: N/A;   FLEXIBLE BRONCHOSCOPY W/ UPPER ENDOSCOPY     neg   KNEE  ARTHROSCOPY     Left   ROTATOR CUFF REPAIR     R shoulder   TONSILLECTOMY     TRIGGER FINGER RELEASE Right    Right Thumb    Current Medications: Current Meds  Medication Sig   acetaminophen (TYLENOL) 325 MG tablet Take 325-650 mg by mouth every 6 (six) hours as needed (for pain).   acyclovir (ZOVIRAX) 400 MG tablet Take 400 mg by mouth 3 (three) times daily.   apixaban (ELIQUIS) 5 MG TABS tablet Take 1 tablet (5 mg total) by mouth 2 (two) times daily.   ascorbic acid (VITAMIN C) 100 MG tablet Take 100 mg by mouth daily.   b complex vitamins capsule daily.   Cholecalciferol 25 MCG (1000 UT) capsule Take 1,000 Units by mouth daily.   diclofenac (VOLTAREN) 0.1 % ophthalmic solution 4 (four) times daily as needed.   EPINEPHrine 0.3 mg/0.3 mL IJ SOAJ injection Inject 0.3 mg into the muscle as needed for anaphylaxis.   ferrous gluconate (FERGON) 240 (27 FE) MG tablet Take 27 mg by mouth daily.   fluorometholone (FML) 0.1 % ophthalmic suspension Place 1 drop into both eyes every Monday, Wednesday, and Friday.    fluticasone (FLONASE) 50 MCG/ACT nasal spray Place 1 spray into both nostrils 2 (two) times daily.   glipiZIDE (GLUCOTROL XL) 5 MG 24 hr tablet Take 5 mg by mouth daily.   glucose blood (FREESTYLE LITE) test strip CHECK BLOOD SUGAR ONCE DAILY AS DIRECTED.  DX:790.29   glucose blood test strip Inject 1 strip into the skin daily.   hydrocortisone 1 % ointment Apply 1 application topically 2 (two) times daily.   ipratropium (ATROVENT HFA) 17 MCG/ACT inhaler Inhale 2 puffs into the lungs as needed.   ipratropium (ATROVENT) 0.03 % nasal spray 2 sprays in each nostril   Lancets (FREESTYLE) lancets Check blood sugar once daily as directed DX:790.29 (FREESTYLE FREEDOM LITE)   lidocaine (XYLOCAINE) 5 % ointment Apply 1 application topically 3 (three) times daily as needed.   loratadine (CLARITIN) 10 MG tablet Take 1 tablet (10 mg total) by mouth daily.   metoprolol tartrate (LOPRESSOR) 50 MG  tablet TAKE 1 AND 1/2 TABLETS BY  MOUTH TWICE DAILY   montelukast (SINGULAIR) 10 MG tablet Take 1 tablet (10 mg total) by mouth at bedtime.   Multiple Vitamin (MULTIVITAMIN) tablet Take 1 tablet by mouth daily. Shaklee brand   nitroGLYCERIN (NITROSTAT) 0.4 MG SL tablet Place 1 tablet (0.4 mg total) under the tongue every 5 (five) minutes as needed for chest pain.   pantoprazole (PROTONIX) 40 MG tablet Take 40 mg by mouth 2 (two) times daily.   trolamine  salicylate (ASPERCREME) 10 % cream Apply 1 application topically as needed for muscle pain.   verapamil (CALAN-SR) 180 MG CR tablet Take 180 mg by mouth at bedtime.      Allergies:   Codeine, Macrodantin [nitrofurantoin macrocrystal], Other, Propoxyphene, Propoxyphene hcl, Latex, Dulaglutide, Metformin hcl, Neosporin [bacitracin-polymyxin b], Zoster vac recomb adjuvanted, Avocado, Banana, Ciprofloxacin, Tizanidine, and Tramadol   Social History   Socioeconomic History   Marital status: Married    Spouse name: Not on file   Number of children: Not on file   Years of education: Not on file   Highest education level: Not on file  Occupational History   Occupation: Estate manager/land agent: [employer]  Tobacco Use   Smoking status: Never   Smokeless tobacco: Never  Vaping Use   Vaping Use: Never used  Substance and Sexual Activity   Alcohol use: No    Alcohol/week: 0.0 standard drinks   Drug use: No   Sexual activity: Not Currently    Birth control/protection: Post-menopausal    Comment: 1st intercourse- 20  partners - 1  Other Topics Concern   Not on file  Social History Narrative   Regular Exercise-no         Social Determinants of Health   Financial Resource Strain: Not on file  Food Insecurity: Not on file  Transportation Needs: Not on file  Physical Activity: Not on file  Stress: Not on file  Social Connections: Not on file     Family History: The patient's family history includes Alcohol abuse in  her mother; Diabetes in her maternal aunt, maternal grandmother, and maternal uncle; Other in her father; Tuberculosis in her maternal uncle. There is no history of Coronary artery disease.  Kristen Murray:   Please see the history of present illness.     All other systems reviewed and are negative.  EKGs/Labs/Other Studies Reviewed:    The following studies were reviewed today:   Recent Labs: 08/11/2021: ALT 13; BUN 15; Creatinine 0.59; Hemoglobin 11.8; Platelet Count 176; Potassium 4.0; Sodium 138  Recent Lipid Panel    Component Value Date/Time   CHOL 164 01/27/2020 1835   TRIG 85 01/27/2020 1835   HDL 44 01/27/2020 1835   CHOLHDL 3.7 01/27/2020 1835   VLDL 17 01/27/2020 1835   LDLCALC 103 (H) 01/27/2020 1835    Physical Exam:    Physical Exam: Blood pressure 138/68, pulse 69, height _0  (1.651 m), weight 173 lb 3.2 oz (78.6 kg), last menstrual period 07/30/1990, SpO2 97 %.  GEN:  Well nourished, well developed in no acute distress HEENT: Normal NECK: No JVD; No carotid bruits LYMPHATICS: No lymphadenopathy CARDIAC: RRR , no murmurs, rubs, gallops RESPIRATORY:  Clear to auscultation without rales, wheezing or rhonchi  ABDOMEN: Soft, non-tender, non-distended MUSCULOSKELETAL:  No edema; No deformity  SKIN: Warm and dry NEUROLOGIC:  Alert and oriented x 3    EKG:   Jan. 24, 2023  NSR at 69.  No ST or T wave changes.   ASSESSMENT:    1. Atrial flutter, paroxysmal ([place])   2. Essential hypertension   3. Coronary artery disease involving native coronary artery of native heart without angina pectoris    PLAN:      Atrial Flutter:     :     2.  Hypertension:    BP is well controlled.    3.  Hyperlipidemia:   - managed by her primary md   4.  Dyspnea with exertion :  she has had DOE for the past year or so .   Exam is unremarkable.  Echo showed normal LV systolic function a year ago. I suspect this is due to generalized deconditioning . Ive encouraged her to start a  regular exercise program , with advancing her exercise every several days and see how she does.  If she is not able to progress, she will give [place] a call    Medication Adjustments/Labs and Tests Ordered: Current medicines are reviewed at length with the patient today.  Concerns regarding medicines are outlined above.  No orders of the defined types were placed in this encounter.   No orders of the defined types were placed in this encounter.    There are no Patient Instructions on file for this visit.   Signed, Mertie Moores, MD  11/22/2021 9:51 AM    [place]

## 2021-11-22 NOTE — Patient Instructions (Signed)
Medication Instructions:  Your physician recommends that you continue on your current medications as directed. Please refer to the Current Medication list given to you today.  *If you need a refill on your cardiac medications before your next appointment, please call your pharmacy*   Lab Work: None ordered  If you have labs (blood work) drawn today and your tests are completely normal, you will receive your results only by: [place] (if you have MyChart) OR A paper copy in the mail If you have any lab test that is abnormal or we need to change your treatment, we will call you to review the results.   Testing/Procedures: None ordered   Follow-Up: At [facility] you and your health needs are our priority.  As part of our continuing mission to provide you with exceptional heart care, we have created designated Provider Care Teams.  These Care Teams include your primary Cardiologist (physician) and Advanced Practice Providers (APPs -  Physician Assistants and Nurse Practitioners) who all work together to provide you with the care you need, when you need it.  We recommend signing up for the patient portal called "MyChart".  Sign up information is provided on this After Visit Summary.  MyChart is used to connect with patients for Virtual Visits (Telemedicine).  Patients are able to view lab/test results, encounter notes, upcoming appointments, etc.  Non-urgent messages can be sent to your provider as well.   To learn more about what you can do with MyChart, go to [email or web address].    Your next appointment:   9 month(s)  The format for your next appointment:   In Person  Provider:   Richardson Dopp, PA-C         Other Instructions

## 2021-11-22 NOTE — Progress Notes (Signed)
Ziextenzo changed to Zarxio per Dr. Antonieta Pert instructions.

## 2021-11-23 DIAGNOSIS — K6289 Other specified diseases of anus and rectum: Secondary | ICD-10-CM | POA: Diagnosis not present

## 2021-11-23 DIAGNOSIS — N3010 Interstitial cystitis (chronic) without hematuria: Secondary | ICD-10-CM | POA: Diagnosis not present

## 2021-11-23 DIAGNOSIS — R3915 Urgency of urination: Secondary | ICD-10-CM | POA: Diagnosis not present

## 2021-11-30 ENCOUNTER — Encounter: Payer: Self-pay | Admitting: Family

## 2021-11-30 ENCOUNTER — Telehealth: Payer: Self-pay | Admitting: Family

## 2021-11-30 ENCOUNTER — Telehealth: Payer: Self-pay | Admitting: *Deleted

## 2021-11-30 NOTE — Telephone Encounter (Signed)
Called and spoke with the patient and she stated that she has still had the cough since her OV with you in December, but that the dripping nose started yesterday. She denies any fever, body aches, or chills, and no productive cough. She is still using her Flonase and Ipratropium.

## 2021-11-30 NOTE — Telephone Encounter (Signed)
Called and spoke with patient, she is scheduled to see Dr. Ernst Bowler tomorrow in [place].

## 2021-11-30 NOTE — Telephone Encounter (Signed)
Please have her schedule a follow up appointment

## 2021-11-30 NOTE — Telephone Encounter (Signed)
° °  Pre-operative Risk Assessment    Patient Name: Kristen Murray  DOB: [birth year] MRN: [id number]      Request for Surgical Clearance    Procedure:  SCREENING  COLONOSCOPY  Date of Surgery:  Clearance 02/16/22                                 Surgeon:  DR. Paulita Fujita Surgeon's Group or Practice Name:  [facility] Phone number:  [phone] Fax number:  [phone]   Type of Clearance Requested:   - Medical  - Pharmacy:  Hold Apixaban (Eliquis)     Type of Anesthesia:   PROPOFOL   Additional requests/questions:    Jiles Prows   11/30/2021, 1:45 PM

## 2021-11-30 NOTE — Telephone Encounter (Signed)
Patient called to talk with Kristen Murray and said that she is blowing her nose and coughing a lot and needs something to help her. [facility] in [place]. [phone].

## 2021-12-01 ENCOUNTER — Ambulatory Visit: Payer: Medicare Other | Admitting: Allergy & Immunology

## 2021-12-01 DIAGNOSIS — R52 Pain, unspecified: Secondary | ICD-10-CM | POA: Diagnosis not present

## 2021-12-01 DIAGNOSIS — U071 COVID-19: Secondary | ICD-10-CM | POA: Diagnosis not present

## 2021-12-01 DIAGNOSIS — R0981 Nasal congestion: Secondary | ICD-10-CM | POA: Diagnosis not present

## 2021-12-01 DIAGNOSIS — R000 Tachycardia, unspecified: Secondary | ICD-10-CM | POA: Diagnosis not present

## 2021-12-01 DIAGNOSIS — R051 Acute cough: Secondary | ICD-10-CM | POA: Diagnosis not present

## 2021-12-01 NOTE — Telephone Encounter (Signed)
Clinical pharmacist to review Eliquis 

## 2021-12-04 IMAGING — CT CT ANGIO CHEST-ABD-PELV FOR DISSECTION W/ AND WO/W CM
2 of 9 series · 13 of 46 positions shown, 15 images · non-contrast
Comparison: CTA chest 01/26/2020.  cardiac CTA 01/25/2021.

No prior CT abdomen or pelvis.

CLINICAL DATA: 75-year-old female with epigastric and abdominal
pain radiating to the mid and upper back. Shortness of breath for 2
days after "reaching up and feeling a pop".

EXAM:
CT ANGIOGRAPHY CHEST, ABDOMEN AND PELVIS
TECHNIQUE: Non-contrast CT of the chest was initially obtained.

[Series 5: axial arterial · axial · arterial · 0.98mm/px · z∈[+756,+1320]mm · 10 of 212 slices shown, 12 images]
[im 12/212  soft-tissue]
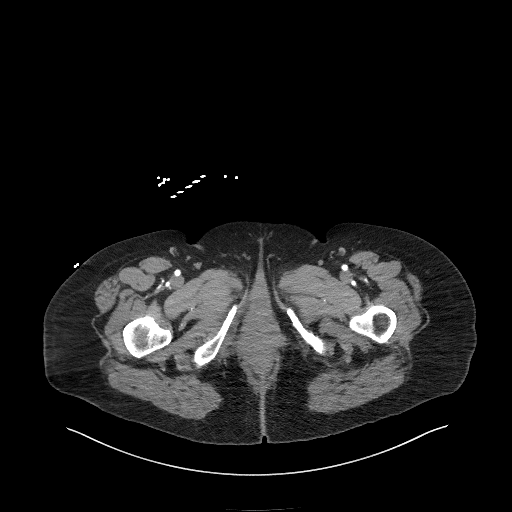
[im 12/212  bone]
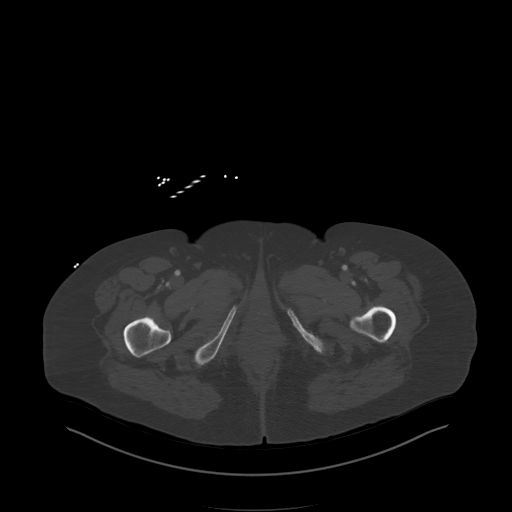
[im 34/212  soft-tissue]
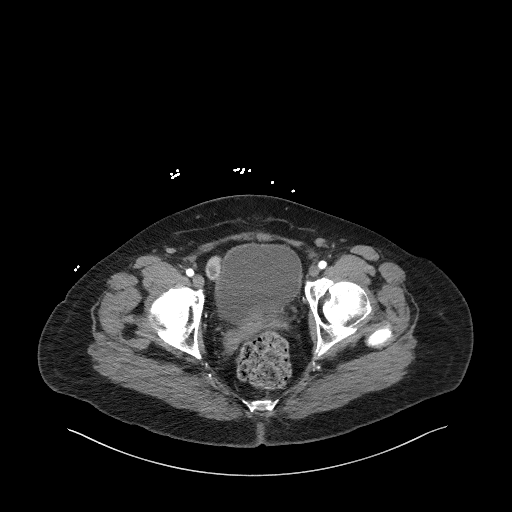
[im 56/212  soft-tissue]
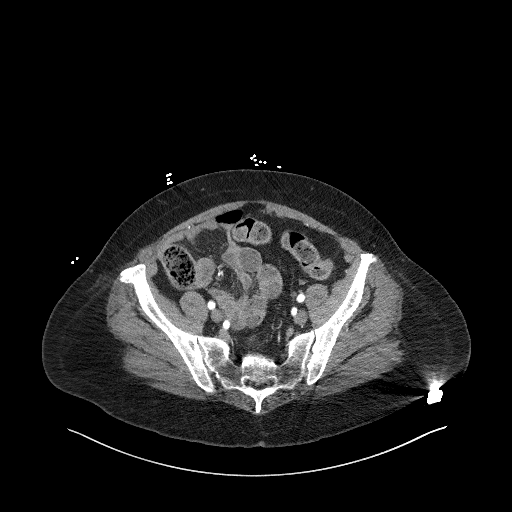
[im 78/212  soft-tissue]
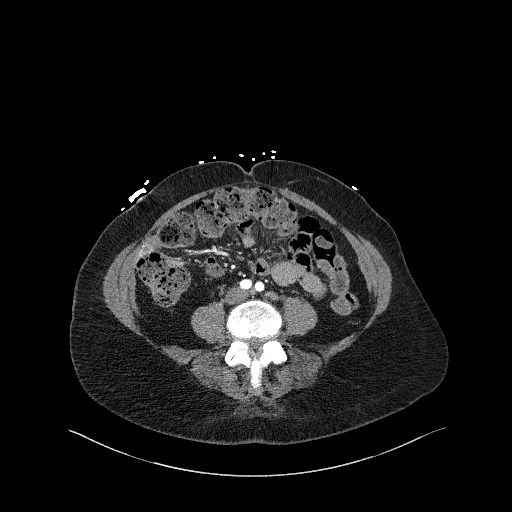
[im 100/212  soft-tissue]
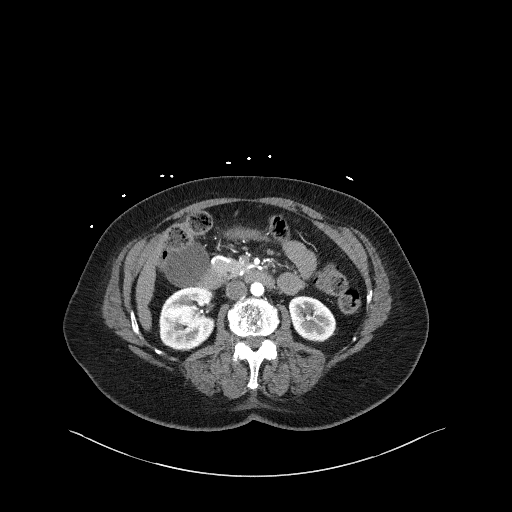
[im 112/212  soft-tissue]
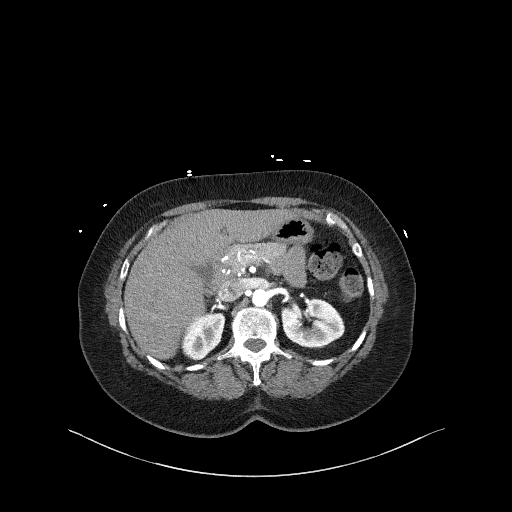
[im 134/212  soft-tissue]
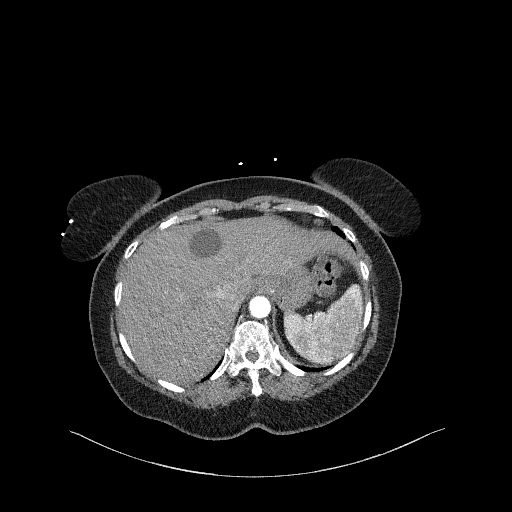
[im 156/212  soft-tissue]
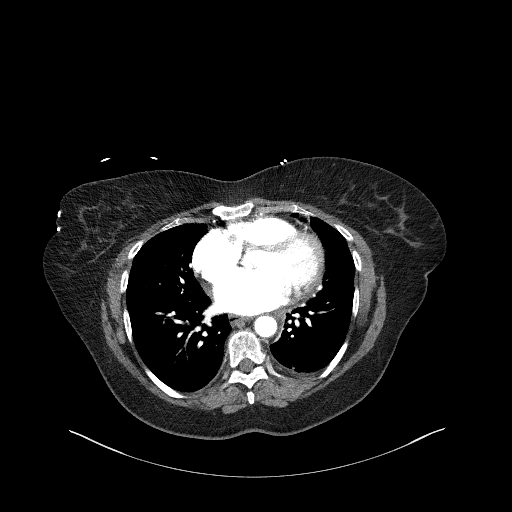
[im 178/212  soft-tissue]
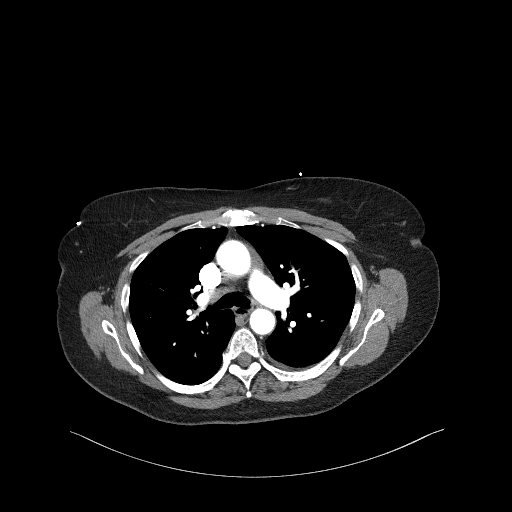
[im 178/212  bone]
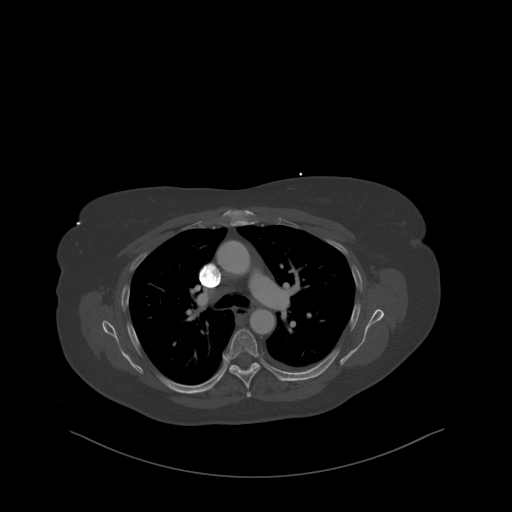
[im 200/212  soft-tissue]
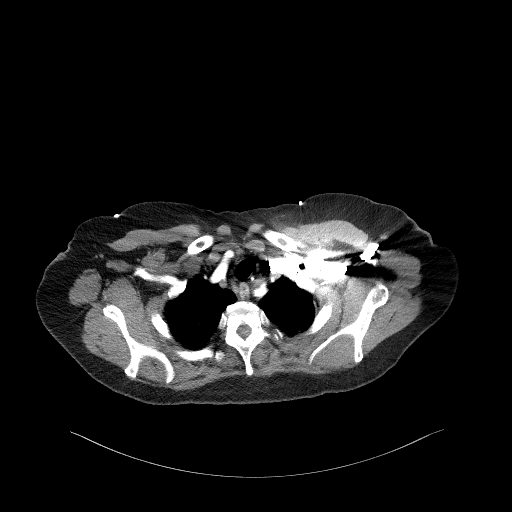

[Series 8: coronals · coronal · 0.73mm/px · 3 of 130 slices shown]
[im 33/130  soft-tissue]
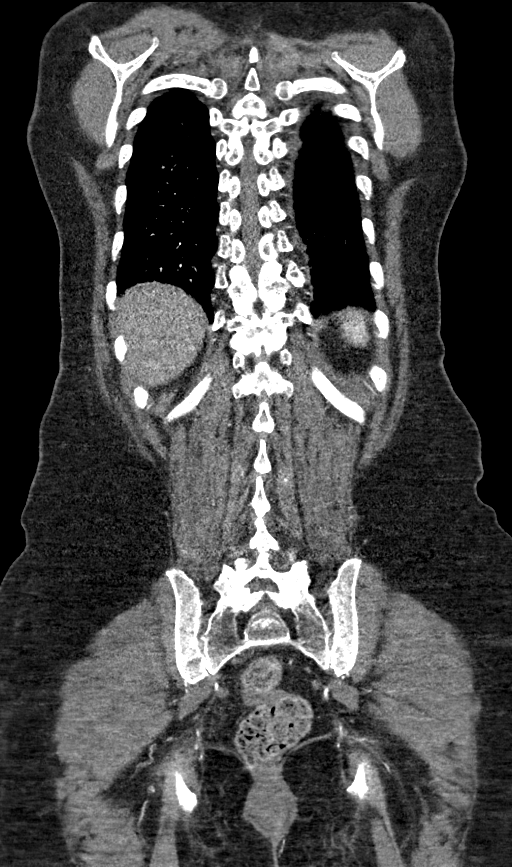
[im 65/130  soft-tissue]
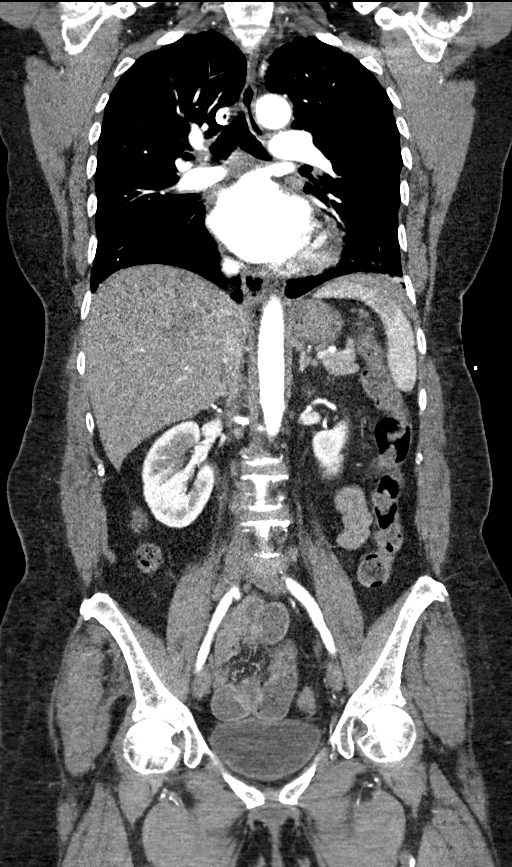
[im 97/130  soft-tissue]
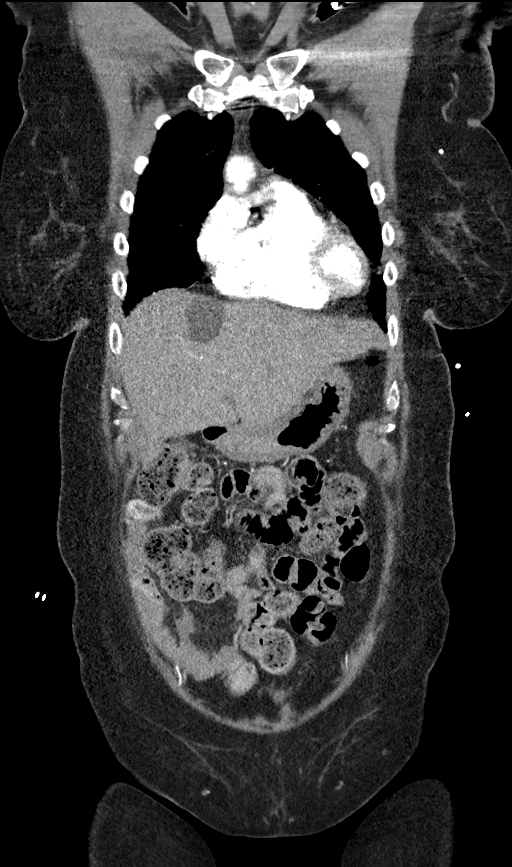

[13 of 46 positions shown; findings below may reference images not displayed]

Multidetector CT imaging through the chest, abdomen and pelvis was
performed using the standard protocol during bolus administration of
intravenous contrast. Multiplanar reconstructed images and MIPs were
obtained and reviewed to evaluate the vascular anatomy.

CONTRAST:  100mL OMNIPAQUE IOHEXOL 350 MG/ML SOLN
FINDINGS: CTA CHEST FINDINGS

Cardiovascular:  Mild cardiomegaly.

There is no thoracic aortic aneurysm or discrete dissection.

But there is a small but conspicuous area of low to intermediate
density along the anterior margin of the descending aorta. See
series 3, image 32 and series 5, image 52. This was not present in
[REDACTED], although might have been present but more apparent than on
the 5256 CTA. No underlying aortic ulceration, plaque, or other
lesion. Elsewhere no significant pericardial effusion.

Central pulmonary arteries are also opacified and appear to be
patent.

Mediastinum/Nodes: Small mediastinal and hilar lymph nodes appear
stable since 5256 and within normal limits.

Lungs/Pleura: Major airways are patent. Stable lung volumes compared
to 5256. There is a trace new layering left pleural effusion. But
otherwise both lungs appear stable and clear.

Musculoskeletal: No acute osseous abnormality identified. No
thoracic compression fracture. No rib fracture identified.

Review of the MIP images confirms the above findings.

CTA ABDOMEN AND PELVIS FINDINGS

VASCULAR

Normal caliber abdominal aorta with no dissection, and only minimal
calcified atherosclerosis mostly at the aortoiliac bifurcation. Mild
to moderate calcified iliac artery atherosclerosis, mostly the right
common iliac artery. Bilateral iliacs and proximal femoral arteries
are patent.

Review of the MIP images confirms the above findings.

NON-VASCULAR

Hepatobiliary: Stable and benign appearing chronic central hepatic
cyst is round measuring about 3.2 cm on series 5, image 78. several
smaller benign hepatic cysts also appears stable. The gallbladder is
within normal limits. No bile duct enlargement.

Pancreas: Negative.

Spleen: Negative.

Adrenals/Urinary Tract: Normal adrenal glands. Nonobstructed kidneys
enhance symmetrically. No nephrolithiasis or renal lesion
identified. Ureters are decompressed. Unremarkable bladder.

Stomach/Bowel: Redundant large bowel with a moderate volume of
retained stool. No large bowel inflammation. Normal retrocecal
appendix suspected on series 5, image 149. Terminal ileum is within
normal limits. Fluid-filled but nondilated small bowel in the
abdomen and pelvis. A small segment of hyperenhancing small bowel
adjacent to the right colon on series 5, image 126 does not appear
thickened or inflamed. No free air, free fluid, mesenteric
stranding. Stomach and duodenum appear negative.

Lymphatic: No lymphadenopathy.

Reproductive: Surgically absent uterus. Diminutive or absent
ovaries.

Other: No pelvic free fluid.

Musculoskeletal: L2 benign vertebral body hemangioma. Lower lumbar
disc, endplate, and facet degeneration. No acute osseous abnormality
identified.

Review of the MIP images confirms the above findings.
IMPRESSION: 1. Normal aortic contour and morphology in the chest and abdomen,
with only minimal atherosclerosis.
But there is a small volume of low-density material anterior to the
descending thoracic aorta (series 5, image 51) and trace layering
left pleural fluid - both new compared to a Cardiac CTA in [REDACTED].
But as there is no aortic dissection or discrete plaque/ulceration,
this finding is of unclear etiology and significance. Perhaps this
is a small volume of trapped pericardial fluid, and in conjunction
with the trace pleural fluid consider the possibility of
pericarditis.

2. Otherwise negative CTA of the chest, abdomen, and pelvis;
No other pulmonary finding, no other acute or inflammatory process
identified.
Benign hepatic cysts.
Retained stool in redundant large bowel.

## 2021-12-05 ENCOUNTER — Inpatient Hospital Stay: Payer: Medicare HMO

## 2021-12-05 ENCOUNTER — Ambulatory Visit: Payer: Medicare Other

## 2021-12-05 ENCOUNTER — Ambulatory Visit: Payer: Medicare Other | Admitting: Hematology & Oncology

## 2021-12-05 NOTE — Telephone Encounter (Signed)
Patient with diagnosis of aflutter on Eliquis for anticoagulation.    Procedure: colonoscopy Date of procedure: 02/16/22  CHA2DS2-VASc Score = 6  This indicates a 9.7% annual risk of stroke. The patient's score is based upon: CHF History: 0 HTN History: 1 Diabetes History: 1 Stroke History: 0 Vascular Disease History: 1 Age Score: 2 Gender Score: 1   CrCl 71mL/min using adjusted body weight Platelet count 176K  Per office protocol, patient can hold Eliquis for 1-2 days prior to procedure.

## 2021-12-05 NOTE — Telephone Encounter (Addendum)
° ° °  Patient Name: Kristen Murray  DOB: Jun 30, 1946 MRN: [id number]  Primary Cardiologist: Mertie Moores, MD  Chart reviewed as part of pre-operative protocol coverage. Patient was recently seen by Dr. Acie Fredrickson on 11/22/2021 at which time she reported some dyspnea with exertion that was felt to be due to deconditioning. I called and spoke with patient today for further pre-op evaluation and she reported no significant changes since last visit. She still reports some dyspnea with exertion but states this is stable (and she no longer notices the shortness of breath after talking for longer periods of time). She denies any shortness of breath at rest, orthopnea, PND, lower extremity. No chest pain, palpitations, or syncope. She is able to complete >4.0 METS. Dyspnea currently felt to be due to deconditioning. However, advised patient to let us know if dyspnea worsens prior to colonoscopy. As of now though, she is at acceptable risk to proceed with low risk for procedure without further cardiovascular testing.   Per office protocol, patient can hold Eliquis for 1-2 days prior to procedure. Please restart this as soon as safely possible afterwards.   I will route this recommendation to the requesting party via Epic fax function and remove from pre-op pool.  Please call with questions.  Darreld Mclean, PA-C 12/05/2021, 1:18 PM

## 2021-12-12 DIAGNOSIS — M25512 Pain in left shoulder: Secondary | ICD-10-CM | POA: Diagnosis not present

## 2021-12-12 DIAGNOSIS — M25522 Pain in left elbow: Secondary | ICD-10-CM | POA: Diagnosis not present

## 2021-12-13 ENCOUNTER — Encounter: Payer: Self-pay | Admitting: Hematology & Oncology

## 2021-12-13 ENCOUNTER — Inpatient Hospital Stay: Payer: Medicare HMO

## 2021-12-13 ENCOUNTER — Inpatient Hospital Stay: Payer: Medicare HMO | Attending: Hematology & Oncology

## 2021-12-13 ENCOUNTER — Telehealth: Payer: Self-pay

## 2021-12-13 ENCOUNTER — Inpatient Hospital Stay: Payer: Medicare HMO | Admitting: Hematology & Oncology

## 2021-12-13 ENCOUNTER — Other Ambulatory Visit: Payer: Self-pay

## 2021-12-13 VITALS — BP 139/67 | HR 63 | Temp 98.1°F | Resp 18 | Ht 65.0 in | Wt 172.0 lb

## 2021-12-13 DIAGNOSIS — D72819 Decreased white blood cell count, unspecified: Secondary | ICD-10-CM

## 2021-12-13 DIAGNOSIS — D4620 Refractory anemia with excess of blasts, unspecified: Secondary | ICD-10-CM

## 2021-12-13 DIAGNOSIS — D500 Iron deficiency anemia secondary to blood loss (chronic): Secondary | ICD-10-CM | POA: Diagnosis not present

## 2021-12-13 LAB — CMP (CANCER CENTER ONLY)
ALT: 33 U/L (ref 0–44)
AST: 18 U/L (ref 15–41)
Albumin: 3.6 g/dL (ref 3.5–5.0)
Alkaline Phosphatase: 78 U/L (ref 38–126)
Anion gap: 6 (ref 5–15)
BUN: 13 mg/dL (ref 8–23)
CO2: 29 mmol/L (ref 22–32)
Calcium: 9.5 mg/dL (ref 8.9–10.3)
Chloride: 102 mmol/L (ref 98–111)
Creatinine: 0.61 mg/dL (ref 0.44–1.00)
GFR, Estimated: >60 mL/min (ref >60)
Glucose, Bld: 131 mg/dL — ABNORMAL HIGH (ref 70–99)
Potassium: 4 mmol/L (ref 3.5–5.1)
Sodium: 137 mmol/L (ref 135–145)
Total Bilirubin: 0.3 mg/dL (ref 0.3–1.2)
Total Protein: 7.4 g/dL (ref 6.5–8.1)

## 2021-12-13 LAB — CBC WITH DIFFERENTIAL (CANCER CENTER ONLY)
Abs Immature Granulocytes: 0.01 10*3/uL (ref 0.00–0.07)
Basophils Absolute: 0 10*3/uL (ref 0.0–0.1)
Basophils Relative: 0 %
Eosinophils Absolute: 0 10*3/uL (ref 0.0–0.5)
Eosinophils Relative: 0 %
HCT: 33.2 % — ABNORMAL LOW (ref 36.0–46.0)
Hemoglobin: 10.7 g/dL — ABNORMAL LOW (ref 12.0–15.0)
Immature Granulocytes: 0 %
Lymphocytes Relative: 74 %
Lymphs Abs: 2.1 10*3/uL (ref 0.7–4.0)
MCH: 28 pg (ref 26.0–34.0)
MCHC: 32.2 g/dL (ref 30.0–36.0)
MCV: 86.9 fL (ref 80.0–100.0)
Monocytes Absolute: 0.4 10*3/uL (ref 0.1–1.0)
Monocytes Relative: 12 %
Neutro Abs: 0.4 10*3/uL — CL (ref 1.7–7.7)
Neutrophils Relative %: 14 %
Platelet Count: 186 10*3/uL (ref 150–400)
RBC: 3.82 MIL/uL — ABNORMAL LOW (ref 3.87–5.11)
RDW: 16.5 % — ABNORMAL HIGH (ref 11.5–15.5)
WBC Count: 2.8 10*3/uL — ABNORMAL LOW (ref 4.0–10.5)
nRBC: 0 % (ref 0.0–0.2)

## 2021-12-13 LAB — RETICULOCYTES
Immature Retic Fract: 18.3 % — ABNORMAL HIGH (ref 2.3–15.9)
RBC.: 3.86 MIL/uL — ABNORMAL LOW (ref 3.87–5.11)
Retic Count, Absolute: 54 10*3/uL (ref 19.0–186.0)
Retic Ct Pct: 1.4 % (ref 0.4–3.1)

## 2021-12-13 MED ORDER — DARBEPOETIN ALFA 300 MCG/0.6ML IJ SOSY
300.0000 ug | PREFILLED_SYRINGE | Freq: Once | INTRAMUSCULAR | Status: AC
  Administered 2021-12-13: 300 ug via SUBCUTANEOUS

## 2021-12-13 MED ORDER — DARBEPOETIN ALFA 300 MCG/0.6ML IJ SOSY
300.0000 ug | PREFILLED_SYRINGE | Freq: Once | INTRAMUSCULAR | Status: DC
  Filled 2021-12-13: qty 0.6

## 2021-12-13 NOTE — Progress Notes (Signed)
Hematology and Oncology Follow Up Visit  Kristen Murray [id number] [birth year] 76 y.o. 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  She apparently fell off the bed recently.  She fell onto her left side.  She has seen orthopedic surgery.  X-rays were done.  She did not break anything.  I am sure that she probably sprained her shoulder.  Otherwise, she seems to be doing okay.  We last saw her back in October.  We did have to give her IV iron in early November.  Her iron saturation was only 8%.  She has had no problems with fever.  There is been no bleeding.  There is no change in bowel or bladder habits.  Currently, I would say that her performance status is probably ECOG 1.   Medications:  Current Outpatient Medications:    acetaminophen (TYLENOL) 325 MG tablet, Take 325-650 mg by mouth every 6 (six) hours as needed (for pain)., Disp: , Rfl:    acyclovir (ZOVIRAX) 400 MG tablet, Take 400 mg by mouth 3 (three) times daily., Disp: , Rfl:    apixaban (ELIQUIS) 5 MG TABS tablet, Take 1 tablet (5 mg total) by mouth 2 (two) times daily., Disp: 180 tablet, Rfl: 1   ascorbic acid (VITAMIN C) 100 MG tablet, Take 100 mg by mouth daily., Disp: , Rfl:    b complex vitamins capsule, daily., Disp: , Rfl:    Cholecalciferol 25 MCG (1000 UT) capsule, Take 1,000 Units by mouth daily., Disp: , Rfl:    diclofenac (VOLTAREN) 0.1 % ophthalmic solution, 4 (four) times daily as needed., Disp: , Rfl:    EPINEPHrine 0.3 mg/0.3 mL IJ SOAJ injection, Inject 0.3 mg into the muscle as needed for anaphylaxis., Disp: 1 each, Rfl: 1   ferrous gluconate (FERGON) 240 (27 FE) MG tablet, Take 27 mg by mouth daily., Disp: , Rfl:    fluorometholone  (FML) 0.1 % ophthalmic suspension, Place 1 drop into both eyes every Monday, Wednesday, and Friday. , Disp: , Rfl:    fluticasone (FLONASE) 50 MCG/ACT nasal spray, Place 1 spray into both nostrils 2 (two) times daily., Disp: 16 g, Rfl: 11   glipiZIDE (GLUCOTROL XL) 5 MG 24 hr tablet, Take 5 mg by mouth daily., Disp: , Rfl:    glucose blood (FREESTYLE LITE) test strip, CHECK BLOOD SUGAR ONCE DAILY AS DIRECTED.  DX:790.29, Disp: 100 each, Rfl: 12   glucose blood test strip, Inject 1 strip into the skin daily., Disp: , Rfl:    hydrocortisone 1 % ointment, Apply 1 application topically 2 (two) times daily., Disp: 30 g, Rfl: 0   ipratropium (ATROVENT HFA) 17 MCG/ACT inhaler, Inhale 2 puffs into the lungs as needed., Disp: , Rfl:    ipratropium (ATROVENT) 0.03 % nasal spray, 2 sprays in each nostril, Disp: , Rfl:    Lancets (FREESTYLE) lancets, Check blood sugar once daily as directed DX:790.29 (FREESTYLE FREEDOM LITE), Disp: 100 each, Rfl: 1   lidocaine (XYLOCAINE) 5 % ointment, Apply 1 application topically 3 (three) times daily as needed., Disp: 1.25 g, Rfl: 0   loratadine (CLARITIN) 10 MG tablet, Take 1 tablet (10 mg total) by mouth daily., Disp: 30  tablet, Rfl: 0   metoprolol tartrate (LOPRESSOR) 50 MG tablet, TAKE 1 AND 1/2 TABLETS BY  MOUTH TWICE DAILY, Disp: 270 tablet, Rfl: 1   montelukast (SINGULAIR) 10 MG tablet, Take 1 tablet (10 mg total) by mouth at bedtime., Disp: 30 tablet, Rfl: 11   Multiple Vitamin (MULTIVITAMIN) tablet, Take 1 tablet by mouth daily. Shaklee brand, Disp: , Rfl:    nitroGLYCERIN (NITROSTAT) 0.4 MG SL tablet, Place 1 tablet (0.4 mg total) under the tongue every 5 (five) minutes as needed for chest pain., Disp: 25 tablet, Rfl: 3   pantoprazole (PROTONIX) 40 MG tablet, Take 40 mg by mouth 2 (two) times daily., Disp: , Rfl:    rosuvastatin (CRESTOR) 5 MG tablet, Take 5 mg by mouth See admin instructions. Take 5 mg by mouth at bedtime on Mondays and Thursdays..Currently on  hold, Disp: , Rfl:    trolamine salicylate (ASPERCREME) 10 % cream, Apply 1 application topically as needed for muscle pain., Disp: , Rfl:    verapamil (CALAN-SR) 180 MG CR tablet, Take 180 mg by mouth at bedtime. , Disp: , Rfl:    ALPRAZolam (XANAX) 0.25 MG tablet, Take 0.25 mg by mouth 2 (two) times daily as needed. (Patient not taking: Reported on 10/03/2021), Disp: , Rfl:    clotrimazole-betamethasone (LOTRISONE) cream, Apply 1 application topically 2 (two) times daily. Use for 2 weeks. (Patient not taking: Reported on 08/31/2021), Disp: 30 g, Rfl: 0   cyclobenzaprine (FLEXERIL) 10 MG tablet, Take 2.5 mg by mouth daily as needed for muscle spasms. (Patient not taking: Reported on 10/03/2021), Disp: , Rfl:    folic acid (FOLVITE) 878 MCG tablet, daily. (Patient not taking: Reported on 11/22/2021), Disp: , Rfl:  No current facility-administered medications for this visit.  Facility-Administered Medications Ordered in Other Visits:    Darbepoetin Alfa (ARANESP) injection 300 mcg, 300 mcg, Subcutaneous, Once, , Rudell Cobb, MD  Allergies:  Allergies  Allergen Reactions   Codeine Hives and Other (See Comments)    Because of a history of documented adverse serious drug reaction, Medi Alert bracelet  is recommended   Macrodantin [Nitrofurantoin Macrocrystal] Other (See Comments)    Numbness, aching all over   Other Shortness Of Breath, Rash and Other (See Comments)    Pine nuts & walnuts throat congestion. No documented angioedema. Avocado- Rash   Propoxyphene Hives, Swelling and Rash   Propoxyphene Hcl Hives, Swelling and Rash   Latex Rash   Dulaglutide     Other reaction(s): weak, ha, no appetite   Metformin Hcl     Other reaction(s): leg discomfort   Neosporin [Bacitracin-Polymyxin B] Other (See Comments)    Blisters   Zoster Vac Recomb Adjuvanted     Other reaction(s): confusion/rash   Avocado Rash   Banana Rash   Ciprofloxacin Nausea Only and Other (See Comments)    Nausea (Note:  Patient takes this when on mission trips, however)   Tizanidine Other (See Comments)    Reaction not recalled   Tramadol Nausea Only    Past Medical History, Surgical history, Social history, and Family History were reviewed and updated.  Review of Systems: Review of Systems  Constitutional: Negative.   HENT:  Negative.    Eyes: Negative.   Respiratory: Negative.    Cardiovascular: Negative.   Gastrointestinal: Negative.   Endocrine: Negative.   Genitourinary: Negative.    Musculoskeletal: Negative.   Skin: Negative.   Neurological: Negative.   Hematological: Negative.   Psychiatric/Behavioral: Negative.     Physical  Exam:  height is 5\' 5"  (1.651 m) and weight is 172 lb (78 kg). Her oral temperature is 98.1 F (36.7 C). Her blood pressure is 139/67 and her pulse is 63. Her respiration is 18 and oxygen saturation is 100%.   Wt Readings from Last 3 Encounters:  12/13/21 172 lb (78 kg)  11/22/21 173 lb 3.2 oz (78.6 kg)  10/03/21 174 lb (78.9 kg)    Physical Exam Vitals reviewed.  HENT:     Head: Normocephalic and atraumatic.  Eyes:     Pupils: Pupils are equal, round, and reactive to light.  Cardiovascular:     Rate and Rhythm: Normal rate and regular rhythm.     Heart sounds: Normal heart sounds.  Pulmonary:     Effort: Pulmonary effort is normal.     Breath sounds: Normal breath sounds.  Abdominal:     General: Bowel sounds are normal.     Palpations: Abdomen is soft.  Musculoskeletal:        General: No tenderness or deformity. Normal range of motion.     Cervical back: Normal range of motion.     Comments: There is moderate swelling of the left calf area.  This is somewhat tender to palpation.  There is no redness.  I cannot palpate any obvious venous cord.  There is no pain behind the left knee.  She has decent pulses in her distal extremities.  Lymphadenopathy:     Cervical: No cervical adenopathy.  Skin:    General: Skin is warm and dry.     Findings: No  erythema or rash.  Neurological:     Mental Status: She is alert and oriented to person, place, and time.  Psychiatric:        Behavior: Behavior normal.        Thought Content: Thought content normal.        Judgment: Judgment normal.     Lab Results  Component Value Date   WBC 2.8 (L) 12/13/2021   HGB 10.7 (L) 12/13/2021   HCT 33.2 (L) 12/13/2021   MCV 86.9 12/13/2021   PLT 186 12/13/2021     Chemistry      Component Value Date/Time   NA 137 12/13/2021 1036   NA 138 03/10/2020 1138   K 4.0 12/13/2021 1036   CL 102 12/13/2021 1036   CO2 29 12/13/2021 1036   BUN 13 12/13/2021 1036   BUN 10 03/10/2020 1138   CREATININE 0.61 12/13/2021 1036      Component Value Date/Time   CALCIUM 9.5 12/13/2021 1036   ALKPHOS 78 12/13/2021 1036   AST 18 12/13/2021 1036   ALT 33 12/13/2021 1036   BILITOT 0.3 12/13/2021 1036       Impression and Plan: Ms. Sgroi is a 76 year old African-American female.   Looks like she does have a low-grade myelodysplasia.  Again she has a very low IPSS-R score.  We will go ahead and give her the Aranesp today.  We will see what her iron studies show.  They would have to get her back in a couple months.  The big news is that she will be going to [place] in late May.  I want to make sure that she has her blood counts to be as good as possible for her trip.   [name], MD 2/14/202311:56 AM

## 2021-12-13 NOTE — Telephone Encounter (Signed)
Panic received from [place] in lab, [place] 0.4. MD aware

## 2021-12-13 NOTE — Patient Instructions (Signed)
Darbepoetin Alfa injection ?What is this medication? ?DARBEPOETIN ALFA (dar be POE e tin  AL fa) helps your body make more red blood cells. It is used to treat anemia caused by chronic kidney failure and chemotherapy. ?This medicine may be used for other purposes; ask your health care provider or pharmacist if you have questions. ?COMMON BRAND NAME(S): Aranesp ?What should I tell my care team before I take this medication? ?They need to know if you have any of these conditions: ?blood clotting disorders or history of blood clots ?cancer patient not on chemotherapy ?cystic fibrosis ?heart disease, such as angina, heart failure, or a history of a heart attack ?hemoglobin level of 12 g/dL or greater ?high blood pressure ?low levels of folate, iron, or vitamin B12 ?seizures ?an unusual or allergic reaction to darbepoetin, erythropoietin, albumin, hamster proteins, latex, other medicines, foods, dyes, or preservatives ?pregnant or trying to get pregnant ?breast-feeding ?How should I use this medication? ?This medicine is for injection into a vein or under the skin. It is usually given by a health care professional in a hospital or clinic setting. ?If you get this medicine at home, you will be taught how to prepare and give this medicine. Use exactly as directed. Take your medicine at regular intervals. Do not take your medicine more often than directed. ?It is important that you put your used needles and syringes in a special sharps container. Do not put them in a trash can. If you do not have a sharps container, call your pharmacist or healthcare provider to get one. ?A special MedGuide will be given to you by the pharmacist with each prescription and refill. Be sure to read this information carefully each time. ?Talk to your pediatrician regarding the use of this medicine in children. While this medicine may be used in children as young as 1 month of age for selected conditions, precautions do apply. ?Overdosage: If  you think you have taken too much of this medicine contact a poison control center or emergency room at once. ?NOTE: This medicine is only for you. Do not share this medicine with others. ?What if I miss a dose? ?If you miss a dose, take it as soon as you can. If it is almost time for your next dose, take only that dose. Do not take double or extra doses. ?What may interact with this medication? ?Do not take this medicine with any of the following medications: ?epoetin alfa ?This list may not describe all possible interactions. Give your health care provider a list of all the medicines, herbs, non-prescription drugs, or dietary supplements you use. Also tell them if you smoke, drink alcohol, or use illegal drugs. Some items may interact with your medicine. ?What should I watch for while using this medication? ?Your condition will be monitored carefully while you are receiving this medicine. ?You may need blood work done while you are taking this medicine. ?This medicine may cause a decrease in vitamin B6. You should make sure that you get enough vitamin B6 while you are taking this medicine. Discuss the foods you eat and the vitamins you take with your health care professional. ?What side effects may I notice from receiving this medication? ?Side effects that you should report to your doctor or health care professional as soon as possible: ?allergic reactions like skin rash, itching or hives, swelling of the face, lips, or tongue ?breathing problems ?changes in vision ?chest pain ?confusion, trouble speaking or understanding ?feeling faint or lightheaded, falls ?high blood   pressure ?muscle aches or pains ?pain, swelling, warmth in the leg ?rapid weight gain ?severe headaches ?sudden numbness or weakness of the face, arm or leg ?trouble walking, dizziness, loss of balance or coordination ?seizures (convulsions) ?swelling of the ankles, feet, hands ?unusually weak or tired ?Side effects that usually do not require  medical attention (report to your doctor or health care professional if they continue or are bothersome): ?diarrhea ?fever, chills (flu-like symptoms) ?headaches ?nausea, vomiting ?redness, stinging, or swelling at site where injected ?This list may not describe all possible side effects. Call your doctor for medical advice about side effects. You may report side effects to FDA at 1-800-FDA-1088. ?Where should I keep my medication? ?Keep out of the reach of children. ?Store in a refrigerator between 2 and 8 degrees C (36 and 46 degrees F). Do not freeze. Do not shake. Throw away any unused portion if using a single-dose vial. Throw away any unused medicine after the expiration date. ?NOTE: This sheet is a summary. It may not cover all possible information. If you have questions about this medicine, talk to your doctor, pharmacist, or health care provider. ?? 2022 Elsevier/Gold Standard (2017-11-05 00:00:00) ? ?

## 2021-12-14 ENCOUNTER — Ambulatory Visit: Payer: Medicare Other | Admitting: Cardiovascular Disease

## 2021-12-14 DIAGNOSIS — L309 Dermatitis, unspecified: Secondary | ICD-10-CM | POA: Diagnosis not present

## 2021-12-14 LAB — IRON AND IRON BINDING CAPACITY (CC-WL,HP ONLY)
Iron: 60 ug/dL (ref 28–170)
Saturation Ratios: 21 % (ref 10.4–31.8)
TIBC: 286 ug/dL (ref 250–450)
UIBC: 226 ug/dL

## 2021-12-14 LAB — FERRITIN: Ferritin: 998 ng/mL — ABNORMAL HIGH (ref 11–307)

## 2021-12-15 ENCOUNTER — Telehealth: Payer: Self-pay | Admitting: *Deleted

## 2021-12-15 NOTE — Telephone Encounter (Signed)
-----   Message from [name], MD sent at 12/15/2021  8:04 AM EST ----- Call - the iron level is ok!!  Laurey Arrow

## 2021-12-15 NOTE — Telephone Encounter (Signed)
Pt notified per order of Dr. Marin Olp that "the iron level is ok!!  Kristen Murray"  Pt is appreciative of call and has no questions at this time.

## 2021-12-19 DIAGNOSIS — M25522 Pain in left elbow: Secondary | ICD-10-CM | POA: Diagnosis not present

## 2021-12-19 DIAGNOSIS — M25512 Pain in left shoulder: Secondary | ICD-10-CM | POA: Diagnosis not present

## 2021-12-20 ENCOUNTER — Other Ambulatory Visit: Payer: Self-pay | Admitting: *Deleted

## 2021-12-20 MED ORDER — METOPROLOL TARTRATE 50 MG PO TABS: 75.0000 mg | ORAL_TABLET | Freq: Two times a day (BID) | ORAL | 3 refills | Status: DC

## 2021-12-20 MED ORDER — APIXABAN 5 MG PO TABS: 5.0000 mg | ORAL_TABLET | Freq: Two times a day (BID) | ORAL | 1 refills | Status: DC

## 2021-12-20 NOTE — Telephone Encounter (Signed)
Pt last saw Dr Acie Fredrickson 11/22/21, last labs 12/13/21 Creat 0.61, age 76, weight 78kg, based on specified criteria pt is on appropriate dosage of Eliquis 5mg  BID for aflutter.  Will refill rx.

## 2021-12-22 ENCOUNTER — Other Ambulatory Visit: Payer: Self-pay | Admitting: *Deleted

## 2021-12-22 MED ORDER — FLUTICASONE PROPIONATE 50 MCG/ACT NA SUSP: 1.0000 | Freq: Two times a day (BID) | NASAL | 1 refills | Status: DC

## 2021-12-22 MED ORDER — IPRATROPIUM BROMIDE 0.03 % NA SOLN: NASAL | 1 refills | Status: DC

## 2021-12-22 MED ORDER — MONTELUKAST SODIUM 10 MG PO TABS: 10.0000 mg | ORAL_TABLET | Freq: Every day | ORAL | 1 refills | Status: DC

## 2021-12-24 DIAGNOSIS — S0501XA Injury of conjunctiva and corneal abrasion without foreign body, right eye, initial encounter: Secondary | ICD-10-CM | POA: Diagnosis not present

## 2021-12-26 DIAGNOSIS — M25512 Pain in left shoulder: Secondary | ICD-10-CM | POA: Diagnosis not present

## 2021-12-26 DIAGNOSIS — M25532 Pain in left wrist: Secondary | ICD-10-CM | POA: Diagnosis not present

## 2021-12-29 DIAGNOSIS — Z1389 Encounter for screening for other disorder: Secondary | ICD-10-CM | POA: Diagnosis not present

## 2021-12-29 DIAGNOSIS — G250 Essential tremor: Secondary | ICD-10-CM | POA: Diagnosis not present

## 2021-12-29 DIAGNOSIS — Z0000 Encounter for general adult medical examination without abnormal findings: Secondary | ICD-10-CM | POA: Diagnosis not present

## 2021-12-29 DIAGNOSIS — D329 Benign neoplasm of meninges, unspecified: Secondary | ICD-10-CM | POA: Diagnosis not present

## 2021-12-29 DIAGNOSIS — I483 Typical atrial flutter: Secondary | ICD-10-CM | POA: Diagnosis not present

## 2021-12-29 DIAGNOSIS — E0869 Diabetes mellitus due to underlying condition with other specified complication: Secondary | ICD-10-CM | POA: Diagnosis not present

## 2021-12-29 DIAGNOSIS — D72819 Decreased white blood cell count, unspecified: Secondary | ICD-10-CM | POA: Diagnosis not present

## 2021-12-29 DIAGNOSIS — E7800 Pure hypercholesterolemia, unspecified: Secondary | ICD-10-CM | POA: Diagnosis not present

## 2021-12-29 DIAGNOSIS — E1169 Type 2 diabetes mellitus with other specified complication: Secondary | ICD-10-CM | POA: Diagnosis not present

## 2021-12-29 DIAGNOSIS — K219 Gastro-esophageal reflux disease without esophagitis: Secondary | ICD-10-CM | POA: Diagnosis not present

## 2021-12-29 DIAGNOSIS — I471 Supraventricular tachycardia: Secondary | ICD-10-CM | POA: Diagnosis not present

## 2021-12-30 ENCOUNTER — Other Ambulatory Visit: Payer: Self-pay | Admitting: *Deleted

## 2021-12-30 MED ORDER — IPRATROPIUM BROMIDE 0.03 % NA SOLN: NASAL | 1 refills | Status: DC

## 2022-01-02 NOTE — Progress Notes (Signed)
76 y.o. [id number] Married [place] American female here for breast and pelvic exam.    She is also followed for chronic vulvitis. She has chronic itching.  She has used Lotrisone cream and Lidocaine ointment.  States her symptoms persist. No vaginal discharge.   [facility] who gave her Nystatin powder, which did not help much. She has interstitial cystitis.   Wears pad when she leaves the house.  Hx genital HSV.  Uses Acyclovir as needed.   Has DM.  States her blood sugar is usually around 130.   She has a colonoscopy next month.  PCP: [name], MD    Patient's last menstrual period was 07/30/1990.           Sexually active: No.  The current method of family planning is status post hysterectomy.    Exercising: No.   Occ. Walking--bad knees Smoker:  no  Health Maintenance: Pap:  12-15-19 Neg, 11-05-14 Neg:Neg HR HPV, 10-16-11 Neg History of abnormal Pap:  no MMG:  03-07-21 Diag.Bil/neg/BiRads2 Colonoscopy:  2011 -- schedule for 01/2022 BMD: 2017  Result :Normal TDaP:  09-01-14 Gardasil:   n/a ACZ:YSAYT Hep C:never Screening Labs:  PCP   reports that she has never smoked. She has never used smokeless tobacco. She reports that she does not drink alcohol and does not use drugs.  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    HTN (hypertension)    Hyperglycemia    Hyperlipidemia    Meningioma ([facility])     Myelodysplasia, low grade ([place]) 11/18/2020   Nephrolithiasis    Dr Amalia Hailey, [facility], Hx of [3][   Polyp of colon    Radiculopathy     Past Surgical History:  Procedure Laterality Date   ABDOMINAL HYSTERECTOMY     TAH/BSO --fibroids, no cancer   BALLOON DILATION N/A 07/20/2014   Procedure: BALLOON DILATION;  Surgeon: Garlan Fair, MD;  Location: [facility];  Service: Endoscopy;  Laterality: N/A;   BREAST BIOPSY     Right   CARPAL TUNNEL RELEASE     & trigger thumb RUE; Dr Alphonzo Cruise   COLONOSCOPY W/ POLYPECTOMY  07/2004   Neg 2011; Dr Earle Gell   CYSTOSCOPY  11/2009   Neg   ESOPHAGOGASTRODUODENOSCOPY N/A 07/20/2014   Procedure: ESOPHAGOGASTRODUODENOSCOPY (EGD);  Surgeon: Garlan Fair, MD;  Location: [facility];  Service: Endoscopy;  Laterality: N/A;   FLEXIBLE BRONCHOSCOPY W/ UPPER ENDOSCOPY     neg   KNEE ARTHROSCOPY     Left   ROTATOR CUFF REPAIR     R shoulder   TONSILLECTOMY     TRIGGER FINGER RELEASE Right    Right Thumb    Current Outpatient Medications  Medication Sig Dispense Refill   acetaminophen (TYLENOL) 325 MG tablet Take 325-650 mg by mouth every 6 (six) hours as needed (for pain).     ALPRAZolam Duanne Moron)  0.25 MG tablet Take 0.25 mg by mouth 2 (two) times daily as needed.     apixaban (ELIQUIS) 5 MG TABS tablet Take 1 tablet (5 mg total) by mouth 2 (two) times daily. 180 tablet 1   ascorbic acid (VITAMIN C) 100 MG tablet Take 100 mg by mouth daily.     b complex vitamins capsule daily.     Cholecalciferol 25 MCG (1000 UT) capsule Take 1,000 Units by mouth daily.     clotrimazole-betamethasone (LOTRISONE) cream Apply 1 application topically 2 (two) times daily. Use for 2 weeks. 30 g 0   cyclobenzaprine (FLEXERIL) 10 MG tablet Take 2.5 mg by mouth daily as needed for muscle spasms.     diclofenac (VOLTAREN) 0.1 % ophthalmic solution 4 (four) times daily as needed.     EPINEPHrine 0.3 mg/0.3 mL IJ SOAJ injection Inject 0.3 mg into the muscle as needed for  anaphylaxis. 1 each 1   ferrous gluconate (FERGON) 240 (27 FE) MG tablet Take 27 mg by mouth daily.     fluorometholone (FML) 0.1 % ophthalmic suspension Place 1 drop into both eyes every Monday, Wednesday, and Friday.      fluticasone (FLONASE) 50 MCG/ACT nasal spray Place 1 spray into both nostrils 2 (two) times daily. 48 g 1   folic acid (FOLVITE) 026 MCG tablet daily.     glipiZIDE (GLUCOTROL XL) 5 MG 24 hr tablet Take 5 mg by mouth daily.     glucose blood (FREESTYLE LITE) test strip CHECK BLOOD SUGAR ONCE DAILY AS DIRECTED.  DX:790.29 100 each 12   glucose blood test strip Inject 1 strip into the skin daily.     hydrocortisone 1 % ointment Apply 1 application topically 2 (two) times daily. 30 g 0   ipratropium (ATROVENT HFA) 17 MCG/ACT inhaler Inhale 2 puffs into the lungs as needed.     ipratropium (ATROVENT) 0.03 % nasal spray 2 sprays in each nostril every 6 hours as directed 90 mL 1   Lancets (FREESTYLE) lancets Check blood sugar once daily as directed DX:790.29 (FREESTYLE FREEDOM LITE) 100 each 1   lidocaine (XYLOCAINE) 5 % ointment Apply 1 application topically 3 (three) times daily as needed. 1.25 g 0   loratadine (CLARITIN) 10 MG tablet Take 1 tablet (10 mg total) by mouth daily. 30 tablet 0   metoprolol tartrate (LOPRESSOR) 50 MG tablet Take 1.5 tablets (75 mg total) by mouth 2 (two) times daily. 270 tablet 3   montelukast (SINGULAIR) 10 MG tablet Take 1 tablet (10 mg total) by mouth at bedtime. 90 tablet 1   Multiple Vitamin (MULTIVITAMIN) tablet Take 1 tablet by mouth daily. Shaklee brand     mupirocin ointment (BACTROBAN) 2 % Apply 1 application. topically daily.     nitroGLYCERIN (NITROSTAT) 0.4 MG SL tablet Place 1 tablet (0.4 mg total) under the tongue every 5 (five) minutes as needed for chest pain. 25 tablet 3   pantoprazole (PROTONIX) 40 MG tablet Take 40 mg by mouth 2 (two) times daily.     rosuvastatin (CRESTOR) 5 MG tablet Take 5 mg by mouth See admin instructions. Take  5 mg by mouth at bedtime on Mondays and Thursdays..Currently on hold     tretinoin (RETIN-A) 0.025 % cream Apply 1 application. topically at bedtime.     trolamine salicylate (ASPERCREME) 10 % cream Apply 1 application topically as needed for muscle pain.     verapamil (CALAN-SR) 180 MG CR tablet Take 180 mg by mouth at bedtime.  valACYclovir (VALTREX) 500 MG tablet Take 1 tablet (500 mg total) by mouth 2 (two) times daily. Take twice a day for thee days as needed for an outbreak. 30 tablet 1   No current facility-administered medications for this visit.    Family History  Problem Relation Age of Onset   Alcohol abuse Mother    Other Father        unknown   Diabetes Maternal Aunt    Diabetes Maternal Uncle    Tuberculosis Maternal Uncle    Diabetes Maternal Grandmother    Coronary artery disease Neg Hx     Review of Systems  All other systems reviewed and are negative.  Exam:   BP 128/60    Pulse (!) 57    Ht '5\' 4"'$  (1.626 m)    Wt 167 lb (75.8 kg)    LMP 07/30/1990    SpO2 98%    BMI 28.67 kg/m     General appearance: alert, cooperative and appears stated age Head: normocephalic, without obvious abnormality, atraumatic Neck: no adenopathy, supple, symmetrical, trachea midline and thyroid normal to inspection and palpation Lungs: clear to auscultation bilaterally Breasts: normal appearance, no masses or tenderness, No nipple retraction or dimpling, No nipple discharge or bleeding, No axillary adenopathy Heart: regular rate and rhythm Abdomen: soft, non-tender; no masses, no organomegaly Extremities: extremities normal, atraumatic, no cyanosis or edema Skin: skin color, texture, turgor normal. No rashes or lesions Lymph nodes: cervical, supraclavicular, and axillary nodes normal. Neurologic: grossly normal  Pelvic: External genitalia:  hypopigmented are of the right inferior labia majora.               No abnormal inguinal nodes palpated.              Urethra:  normal  appearing urethra with no masses, tenderness or lesions              Bartholins and Skenes: normal                 Vagina: normal appearing vagina with normal color and discharge, no lesions              Cervix: absent.              Pap taken: no Bimanual Exam:  Uterus: absent.               Adnexa: no mass, fullness, tenderness              Rectal exam: yes.  Confirms.              Anus:  normal sphincter tone, no lesions.  Darker coloration of the perianal region.   Chaperone was present for exam:  Estill Bamberg, CMA  Assessment:   Well woman visit with gynecologic exam. Status post TAH/BSO.  Chronic vulvitis.  Probably multifactorial.  Likely has some yeast component, contact dermatitis, chronic itch/scratch.  Hx HSV.  Urinary incontinence. Using pads.  High risk Medicare GYN. DM.  CAD. On Eliquis.  Low WBC.   Plan: Mammogram screening discussed. Self breast awareness reviewed. Pap and HR HPV as above. Guidelines for Calcium, Vitamin D, regular exercise program including cardiovascular and weight bearing exercise. Stop Acyclovir.  Ok for Valtrex 500 mg po bid x 3 days as needed for an outbreak.  Return for vulvar biopsy.  Rationale explained.  Ok to use Lotrisone bid x 2 weeks as needed.  Follow up annually and prn.   After visit summary provided.  36 min  total time was spent for this patient encounter, including preparation, face-to-face counseling with the patient, coordination of care, and documentation of the encounter.

## 2022-01-04 ENCOUNTER — Other Ambulatory Visit: Payer: Self-pay

## 2022-01-04 ENCOUNTER — Ambulatory Visit (INDEPENDENT_AMBULATORY_CARE_PROVIDER_SITE_OTHER): Payer: Medicare HMO | Admitting: Obstetrics and Gynecology

## 2022-01-04 ENCOUNTER — Encounter: Payer: Self-pay | Admitting: Obstetrics and Gynecology

## 2022-01-04 VITALS — BP 128/60 | HR 57 | Ht 64.0 in | Wt 167.0 lb

## 2022-01-04 DIAGNOSIS — Z9189 Other specified personal risk factors, not elsewhere classified: Secondary | ICD-10-CM | POA: Diagnosis not present

## 2022-01-04 DIAGNOSIS — B009 Herpesviral infection, unspecified: Secondary | ICD-10-CM

## 2022-01-04 DIAGNOSIS — Z01419 Encounter for gynecological examination (general) (routine) without abnormal findings: Secondary | ICD-10-CM | POA: Diagnosis not present

## 2022-01-04 DIAGNOSIS — N763 Subacute and chronic vulvitis: Secondary | ICD-10-CM

## 2022-01-04 MED ORDER — VALACYCLOVIR HCL 500 MG PO TABS: 500.0000 mg | ORAL_TABLET | Freq: Two times a day (BID) | ORAL | 1 refills | Status: DC

## 2022-01-04 NOTE — Patient Instructions (Signed)
I prescribed Lidocaine to you last year to be used on the vulva to treat stinging pain.  ? ?EXERCISE AND DIET:  We recommended that you start or continue a regular exercise program for good health. Regular exercise means any activity that makes your heart beat faster and makes you sweat.  We recommend exercising at least 30 minutes per day at least 3 days a week, preferably 4 or 5.  We also recommend a diet low in fat and sugar.  Inactivity, poor dietary choices and obesity can cause diabetes, heart attack, stroke, and kidney damage, among others.   ? ?ALCOHOL AND SMOKING:  Women should limit their alcohol intake to no more than 7 drinks/beers/glasses of wine (combined, not each!) per week. Moderation of alcohol intake to this level decreases your risk of breast cancer and liver damage. And of course, no recreational drugs are part of a healthy lifestyle.  And absolutely no smoking or even second hand smoke. Most people know smoking can cause heart and lung diseases, but did you know it also contributes to weakening of your bones? Aging of your skin?  Yellowing of your teeth and nails? ? ?CALCIUM AND VITAMIN D:  Adequate intake of calcium and Vitamin D are recommended.  The recommendations for exact amounts of these supplements seem to change often, but generally speaking 600 mg of calcium (either carbonate or citrate) and 800 units of Vitamin D per day seems prudent. Certain women may benefit from higher intake of Vitamin D.  If you are among these women, your doctor will have told you during your visit.   ? ?PAP SMEARS:  Pap smears, to check for cervical cancer or precancers,  have traditionally been done yearly, although recent scientific advances have shown that most women can have pap smears less often.  However, every woman still should have a physical exam from her gynecologist every year. It will include a breast check, inspection of the vulva and vagina to check for abnormal growths or skin changes, a  visual exam of the cervix, and then an exam to evaluate the size and shape of the uterus and ovaries.  And after 77 years of age, a rectal exam is indicated to check for rectal cancers. We will also provide age appropriate advice regarding health maintenance, like when you should have certain vaccines, screening for sexually transmitted diseases, bone density testing, colonoscopy, mammograms, etc.  ? ?MAMMOGRAMS:  All women over 77 years old should have a yearly mammogram. Many facilities now offer a "3D" mammogram, which may cost around $50 extra out of pocket. If possible,  we recommend you accept the option to have the 3D mammogram performed.  It both reduces the number of women who will be called back for extra views which then turn out to be normal, and it is better than the routine mammogram at detecting truly abnormal areas.   ? ?COLONOSCOPY:  Colonoscopy to screen for colon cancer is recommended for all women at age 77.  We know, you hate the idea of the prep.  We agree, BUT, having colon cancer and not knowing it is worse!!  Colon cancer so often starts as a polyp that can be seen and removed at colonscopy, which can quite literally save your life!  And if your first colonoscopy is normal and you have no family history of colon cancer, most women don't have to have it again for 10 years.  Once every ten years, you can do something that may end up  saving your life, right?  We will be happy to help you get it scheduled when you are ready.  Be sure to check your insurance coverage so you understand how much it will cost.  It may be covered as a preventative service at no cost, but you should check your particular policy.   ? ? ?

## 2022-01-10 ENCOUNTER — Ambulatory Visit: Payer: Medicare HMO | Admitting: Obstetrics and Gynecology

## 2022-01-16 DIAGNOSIS — H0288B Meibomian gland dysfunction left eye, upper and lower eyelids: Secondary | ICD-10-CM | POA: Diagnosis not present

## 2022-01-16 DIAGNOSIS — H04123 Dry eye syndrome of bilateral lacrimal glands: Secondary | ICD-10-CM | POA: Diagnosis not present

## 2022-01-16 DIAGNOSIS — H11232 Symblepharon, left eye: Secondary | ICD-10-CM | POA: Diagnosis not present

## 2022-01-16 DIAGNOSIS — H11233 Symblepharon, bilateral: Secondary | ICD-10-CM | POA: Diagnosis not present

## 2022-01-16 DIAGNOSIS — H25813 Combined forms of age-related cataract, bilateral: Secondary | ICD-10-CM | POA: Diagnosis not present

## 2022-01-16 DIAGNOSIS — H0100A Unspecified blepharitis right eye, upper and lower eyelids: Secondary | ICD-10-CM | POA: Diagnosis not present

## 2022-01-16 DIAGNOSIS — H0288A Meibomian gland dysfunction right eye, upper and lower eyelids: Secondary | ICD-10-CM | POA: Diagnosis not present

## 2022-01-16 DIAGNOSIS — B301 Conjunctivitis due to adenovirus: Secondary | ICD-10-CM | POA: Diagnosis not present

## 2022-01-16 DIAGNOSIS — H2513 Age-related nuclear cataract, bilateral: Secondary | ICD-10-CM | POA: Diagnosis not present

## 2022-01-16 DIAGNOSIS — H0100B Unspecified blepharitis left eye, upper and lower eyelids: Secondary | ICD-10-CM | POA: Diagnosis not present

## 2022-01-16 DIAGNOSIS — H52223 Regular astigmatism, bilateral: Secondary | ICD-10-CM | POA: Diagnosis not present

## 2022-01-16 DIAGNOSIS — Z1159 Encounter for screening for other viral diseases: Secondary | ICD-10-CM | POA: Diagnosis not present

## 2022-01-17 ENCOUNTER — Ambulatory Visit: Payer: Medicare HMO | Admitting: Obstetrics and Gynecology

## 2022-01-19 DIAGNOSIS — E1165 Type 2 diabetes mellitus with hyperglycemia: Secondary | ICD-10-CM | POA: Diagnosis not present

## 2022-01-19 DIAGNOSIS — Z79899 Other long term (current) drug therapy: Secondary | ICD-10-CM | POA: Diagnosis not present

## 2022-01-19 DIAGNOSIS — R739 Hyperglycemia, unspecified: Secondary | ICD-10-CM | POA: Diagnosis not present

## 2022-01-20 DIAGNOSIS — H11233 Symblepharon, bilateral: Secondary | ICD-10-CM | POA: Diagnosis not present

## 2022-01-23 DIAGNOSIS — K5900 Constipation, unspecified: Secondary | ICD-10-CM | POA: Diagnosis not present

## 2022-01-23 DIAGNOSIS — R739 Hyperglycemia, unspecified: Secondary | ICD-10-CM | POA: Diagnosis not present

## 2022-01-23 DIAGNOSIS — E1165 Type 2 diabetes mellitus with hyperglycemia: Secondary | ICD-10-CM | POA: Diagnosis not present

## 2022-01-23 DIAGNOSIS — Z1211 Encounter for screening for malignant neoplasm of colon: Secondary | ICD-10-CM | POA: Diagnosis not present

## 2022-01-25 DIAGNOSIS — Z7984 Long term (current) use of oral hypoglycemic drugs: Secondary | ICD-10-CM | POA: Diagnosis not present

## 2022-01-25 DIAGNOSIS — E1165 Type 2 diabetes mellitus with hyperglycemia: Secondary | ICD-10-CM | POA: Diagnosis not present

## 2022-01-25 DIAGNOSIS — L304 Erythema intertrigo: Secondary | ICD-10-CM | POA: Diagnosis not present

## 2022-02-02 ENCOUNTER — Encounter: Payer: Self-pay | Admitting: Hematology & Oncology

## 2022-02-02 ENCOUNTER — Other Ambulatory Visit: Payer: Self-pay

## 2022-02-02 ENCOUNTER — Inpatient Hospital Stay: Payer: Medicare HMO | Admitting: Hematology & Oncology

## 2022-02-02 ENCOUNTER — Inpatient Hospital Stay: Payer: Medicare HMO

## 2022-02-02 ENCOUNTER — Inpatient Hospital Stay: Payer: Medicare HMO | Attending: Hematology & Oncology

## 2022-02-02 ENCOUNTER — Encounter: Payer: Self-pay | Admitting: *Deleted

## 2022-02-02 VITALS — BP 104/67 | HR 69 | Temp 99.0°F | Resp 18 | Ht 64.0 in | Wt 170.0 lb

## 2022-02-02 DIAGNOSIS — D4620 Refractory anemia with excess of blasts, unspecified: Secondary | ICD-10-CM | POA: Insufficient documentation

## 2022-02-02 DIAGNOSIS — D508 Other iron deficiency anemias: Secondary | ICD-10-CM | POA: Diagnosis not present

## 2022-02-02 DIAGNOSIS — D72819 Decreased white blood cell count, unspecified: Secondary | ICD-10-CM

## 2022-02-02 DIAGNOSIS — D500 Iron deficiency anemia secondary to blood loss (chronic): Secondary | ICD-10-CM

## 2022-02-02 DIAGNOSIS — K909 Intestinal malabsorption, unspecified: Secondary | ICD-10-CM | POA: Diagnosis not present

## 2022-02-02 DIAGNOSIS — D46Z Other myelodysplastic syndromes: Secondary | ICD-10-CM

## 2022-02-02 DIAGNOSIS — Z79899 Other long term (current) drug therapy: Secondary | ICD-10-CM | POA: Diagnosis not present

## 2022-02-02 LAB — CBC WITH DIFFERENTIAL (CANCER CENTER ONLY)
Abs Immature Granulocytes: 0 10*3/uL (ref 0.00–0.07)
Band Neutrophils: 2 %
Basophils Absolute: 0 10*3/uL (ref 0.0–0.1)
Basophils Relative: 0 %
Eosinophils Absolute: 0 10*3/uL (ref 0.0–0.5)
Eosinophils Relative: 0 %
HCT: 36.4 % (ref 36.0–46.0)
Hemoglobin: 11.8 g/dL — ABNORMAL LOW (ref 12.0–15.0)
Lymphocytes Relative: 60 %
Lymphs Abs: 1.4 10*3/uL (ref 0.7–4.0)
MCH: 28.4 pg (ref 26.0–34.0)
MCHC: 32.4 g/dL (ref 30.0–36.0)
MCV: 87.5 fL (ref 80.0–100.0)
Monocytes Absolute: 0.3 10*3/uL (ref 0.1–1.0)
Monocytes Relative: 12 %
Neutro Abs: 0.6 10*3/uL — ABNORMAL LOW (ref 1.7–7.7)
Neutrophils Relative %: 26 %
Platelet Count: 151 10*3/uL (ref 150–400)
RBC: 4.16 MIL/uL (ref 3.87–5.11)
RDW: 17.1 % — ABNORMAL HIGH (ref 11.5–15.5)
WBC Count: 2.3 10*3/uL — ABNORMAL LOW (ref 4.0–10.5)
nRBC: 0 % (ref 0.0–0.2)

## 2022-02-02 LAB — FERRITIN: Ferritin: 1104 ng/mL — ABNORMAL HIGH (ref 11–307)

## 2022-02-02 LAB — IRON AND IRON BINDING CAPACITY (CC-WL,HP ONLY)
Iron: 34 ug/dL (ref 28–170)
Saturation Ratios: 14 % (ref 10.4–31.8)
TIBC: 239 ug/dL — ABNORMAL LOW (ref 250–450)
UIBC: 205 ug/dL (ref 148–442)

## 2022-02-02 LAB — CMP (CANCER CENTER ONLY)
ALT: 16 U/L (ref 0–44)
AST: 13 U/L — ABNORMAL LOW (ref 15–41)
Albumin: 3.7 g/dL (ref 3.5–5.0)
Alkaline Phosphatase: 68 U/L (ref 38–126)
Anion gap: 7 (ref 5–15)
BUN: 11 mg/dL (ref 8–23)
CO2: 27 mmol/L (ref 22–32)
Calcium: 9.9 mg/dL (ref 8.9–10.3)
Chloride: 101 mmol/L (ref 98–111)
Creatinine: 0.65 mg/dL (ref 0.44–1.00)
GFR, Estimated: >60 mL/min (ref >60)
Glucose, Bld: 269 mg/dL — ABNORMAL HIGH (ref 70–99)
Potassium: 4 mmol/L (ref 3.5–5.1)
Sodium: 135 mmol/L (ref 135–145)
Total Bilirubin: 0.3 mg/dL (ref 0.3–1.2)
Total Protein: 7.1 g/dL (ref 6.5–8.1)

## 2022-02-02 LAB — RETICULOCYTES
Immature Retic Fract: 12.5 % (ref 2.3–15.9)
RBC.: 4.18 MIL/uL (ref 3.87–5.11)
Retic Count, Absolute: 43.9 10*3/uL (ref 19.0–186.0)
Retic Ct Pct: 1.1 % (ref 0.4–3.1)

## 2022-02-02 NOTE — Progress Notes (Signed)
?Hematology and Oncology Follow Up Visit ? ?Kristen Murray ?[id number] ?13-Dec-1945 76 y.o. ?02/02/2022 ? ? 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 ?Current Therapy:   ?IV iron as needed-dose of Feraheme given on 09/12/2021  ?Neulasta 6 mcg sq PRN ?Aranesp 300 mcg sq for Hgb < 11 ?    ?Interim History:  Kristen Murray is back for follow-up.  So far, everything is going okay for her.  She still feels tired.  Again I think a lot of this has to with her diabetes.  Her blood sugars are quite high.  Her blood sugar today was 269. ? ?She is also tired because of her neutropenia.  Her absolute neutrophil count was 600. ? ?Her iron studies when we last saw her showed a ferritin of 1000 with an iron saturation of 21%. ? ?She is going to go on a big trip in late May.  She will be going to [place] for 3 weeks.  I want to make sure that we get her back before then so that we can make sure her blood count will be as good as possible. ? ?She has had no fever.  There is no cough or shortness of breath.  She has had no rashes.  There is been no change in bowel or bladder habits. ? ?Overall, her performance status is ECOG 1.   ? ?Medications:  ?Current Outpatient Medications:  ?  acetaminophen (TYLENOL) 325 MG tablet, Take 325-650 mg by mouth every 6 (six) hours as needed (for pain)., Disp: , Rfl:  ?  ALPRAZolam (XANAX) 0.25 MG tablet, Take 0.25 mg by mouth 2 (two) times daily as needed., Disp: , Rfl:  ?  apixaban (ELIQUIS) 5 MG TABS tablet, Take 1 tablet (5 mg total) by mouth 2 (two) times daily., Disp: 180 tablet, Rfl: 1 ?  ascorbic acid (VITAMIN C) 100 MG tablet, Take 100 mg by mouth daily., Disp: , Rfl:  ?  b complex vitamins capsule, daily., Disp: , Rfl:  ?  Cholecalciferol 25 MCG (1000 UT) capsule, Take 1,000 Units by mouth daily., Disp: , Rfl:  ?  clotrimazole-betamethasone (LOTRISONE) cream, Apply 1 application topically 2 (two) times daily. Use for 2  weeks., Disp: 30 g, Rfl: 0 ?  cyclobenzaprine (FLEXERIL) 10 MG tablet, Take 2.5 mg by mouth daily as needed for muscle spasms., Disp: , Rfl:  ?  diclofenac (VOLTAREN) 0.1 % ophthalmic solution, 4 (four) times daily as needed., Disp: , Rfl:  ?  EPINEPHrine 0.3 mg/0.3 mL IJ SOAJ injection, Inject 0.3 mg into the muscle as needed for anaphylaxis., Disp: 1 each, Rfl: 1 ?  ferrous gluconate (FERGON) 240 (27 FE) MG tablet, Take 27 mg by mouth daily., Disp: , Rfl:  ?  fluorometholone (FML) 0.1 % ophthalmic suspension, Place 1 drop into both eyes every Monday, Wednesday, and Friday. , Disp: , Rfl:  ?  fluticasone (FLONASE) 50 MCG/ACT nasal spray, Place 1 spray into both nostrils 2 (two) times daily., Disp: 48 g, Rfl: 1 ?  folic acid (FOLVITE) 742 MCG tablet, daily., Disp: , Rfl:  ?  glipiZIDE (GLUCOTROL XL) 5 MG 24 hr tablet, Take 5 mg by mouth daily., Disp: , Rfl:  ?  glucose blood (FREESTYLE LITE) test strip, CHECK BLOOD SUGAR ONCE DAILY AS DIRECTED.  DX:790.29, Disp: 100 each, Rfl: 12 ?  glucose blood test strip, Inject 1 strip into the skin daily., Disp: , Rfl:  ?  ipratropium (ATROVENT HFA) 17 MCG/ACT inhaler, Inhale 2 puffs into the lungs as needed., Disp: , Rfl:  ?  ipratropium (ATROVENT) 0.03 % nasal spray, 2 sprays in each nostril every 6 hours as directed, Disp: 90 mL, Rfl: 1 ?  Lancets (FREESTYLE) lancets, Check blood sugar once daily as directed DX:790.29 (FREESTYLE FREEDOM LITE), Disp: 100 each, Rfl: 1 ?  lidocaine (XYLOCAINE) 5 % ointment, Apply 1 application topically 3 (three) times daily as needed., Disp: 1.25 g, Rfl: 0 ?  loratadine (CLARITIN) 10 MG tablet, Take 1 tablet (10 mg total) by mouth daily., Disp: 30 tablet, Rfl: 0 ?  metoprolol tartrate (LOPRESSOR) 50 MG tablet, Take 1.5 tablets (75 mg total) by mouth 2 (two) times daily., Disp: 270 tablet, Rfl: 3 ?  montelukast (SINGULAIR) 10 MG tablet, Take 1 tablet (10 mg total) by mouth at bedtime., Disp: 90 tablet, Rfl: 1 ?  Multiple Vitamin (MULTIVITAMIN)  tablet, Take 1 tablet by mouth daily. Shaklee brand, Disp: , Rfl:  ?  pantoprazole (PROTONIX) 40 MG tablet, Take 40 mg by mouth 2 (two) times daily., Disp: , Rfl:  ?  rosuvastatin (CRESTOR) 5 MG tablet, Take 5 mg by mouth See admin instructions. Take 5 mg by mouth at bedtime on Mondays and Thursdays..Currently on hold, Disp: , Rfl:  ?  tretinoin (RETIN-A) 0.025 % cream, Apply 1 application. topically at bedtime., Disp: , Rfl:  ?  trolamine salicylate (ASPERCREME) 10 % cream, Apply 1 application topically as needed for muscle pain., Disp: , Rfl:  ?  valACYclovir (VALTREX) 500 MG tablet, Take 1 tablet (500 mg total) by mouth 2 (two) times daily. Take twice a day for thee days as needed for an outbreak., Disp: 30 tablet, Rfl: 1 ?  verapamil (CALAN-SR) 180 MG CR tablet, Take 180 mg by mouth at bedtime. , Disp: , Rfl:  ?  nitroGLYCERIN (NITROSTAT) 0.4 MG SL tablet, Place 1 tablet (0.4 mg total) under the tongue every 5 (five) minutes as needed for chest pain. (Patient not taking: Reported on 02/02/2022), Disp: 25 tablet, Rfl: 3 ? ?Allergies:  ?Allergies  ?Allergen Reactions  ? Codeine Hives and Other (See Comments)  ?  Because of a history of documented adverse serious drug reaction, Medi Alert bracelet  is recommended  ? Macrodantin [Nitrofurantoin Macrocrystal] Other (See Comments)  ?  Numbness, aching all over  ? Other Shortness Of Breath, Rash and Other (See Comments)  ?  Pine nuts & walnuts throat congestion. No documented angioedema. ?  ? Propoxyphene Hives, Swelling, Rash and Other (See Comments)  ? Propoxyphene Hcl Hives, Swelling and Rash  ? Dulaglutide Other (See Comments)  ?  Other reaction(s): weak, ha, no appetite  ? Latex Rash and Other (See Comments)  ? Metformin Hcl Other (See Comments)  ?  Other reaction(s): leg discomfort  ? Neosporin [Bacitracin-Polymyxin B] Other (See Comments)  ?  Blisters  ? Zoster Vac Recomb Adjuvanted Rash and Other (See Comments)  ?  Other reaction(s): confusion  ? Avocado Rash  ?  Banana Rash  ? Ciprofloxacin Nausea Only and Other (See Comments)  ?  Nausea (Note: Patient takes this when on mission trips, however)  ? Tizanidine Other (See Comments)  ?  Reaction not recalled  ? Tramadol Nausea Only  ? ? ?Past Medical History, Surgical history, Social history, and Family History were reviewed and updated. ? ?Review of Systems: ?Review of Systems  ?Constitutional: Negative.   ?HENT:  Negative.    ?Eyes: Negative.   ?Respiratory: Negative.    ?  Cardiovascular: Negative.   ?Gastrointestinal: Negative.   ?Endocrine: Negative.   ?Genitourinary: Negative.    ?Musculoskeletal: Negative.   ?Skin: Negative.   ?Neurological: Negative.   ?Hematological: Negative.   ?Psychiatric/Behavioral: Negative.    ? ?Physical Exam: ? height is '5\' 4"'$  (1.626 m) and weight is 170 lb (77.1 kg). Her oral temperature is 99 ?F (37.2 ?C). Her blood pressure is 104/67 and her pulse is 69. Her respiration is 18 and oxygen saturation is 100%.  ? ?Wt Readings from Last 3 Encounters:  ?02/02/22 170 lb (77.1 kg)  ?01/04/22 167 lb (75.8 kg)  ?12/13/21 172 lb (78 kg)  ? ? ?Physical Exam ?Vitals reviewed.  ?HENT:  ?   Head: Normocephalic and atraumatic.  ?Eyes:  ?   Pupils: Pupils are equal, round, and reactive to light.  ?Cardiovascular:  ?   Rate and Rhythm: Normal rate and regular rhythm.  ?   Heart sounds: Normal heart sounds.  ?Pulmonary:  ?   Effort: Pulmonary effort is normal.  ?   Breath sounds: Normal breath sounds.  ?Abdominal:  ?   General: Bowel sounds are normal.  ?   Palpations: Abdomen is soft.  ?Musculoskeletal:     ?   General: No tenderness or deformity. Normal range of motion.  ?   Cervical back: Normal range of motion.  ?   Comments: There is moderate swelling of the left calf area.  This is somewhat tender to palpation.  There is no redness.  I cannot palpate any obvious venous cord.  There is no pain behind the left knee.  She has decent pulses in her distal extremities.  ?Lymphadenopathy:  ?   Cervical: No  cervical adenopathy.  ?Skin: ?   General: Skin is warm and dry.  ?   Findings: No erythema or rash.  ?Neurological:  ?   Mental Status: She is alert and oriented to person, place, and time.  ?Psychiatric:

## 2022-02-09 ENCOUNTER — Inpatient Hospital Stay: Payer: Medicare HMO

## 2022-02-09 VITALS — BP 140/54 | HR 72 | Temp 97.8°F | Resp 16

## 2022-02-09 DIAGNOSIS — D500 Iron deficiency anemia secondary to blood loss (chronic): Secondary | ICD-10-CM

## 2022-02-09 DIAGNOSIS — D46Z Other myelodysplastic syndromes: Secondary | ICD-10-CM

## 2022-02-09 DIAGNOSIS — Z79899 Other long term (current) drug therapy: Secondary | ICD-10-CM | POA: Diagnosis not present

## 2022-02-09 DIAGNOSIS — K909 Intestinal malabsorption, unspecified: Secondary | ICD-10-CM | POA: Diagnosis not present

## 2022-02-09 DIAGNOSIS — D4620 Refractory anemia with excess of blasts, unspecified: Secondary | ICD-10-CM | POA: Diagnosis not present

## 2022-02-09 DIAGNOSIS — D508 Other iron deficiency anemias: Secondary | ICD-10-CM | POA: Diagnosis not present

## 2022-02-09 DIAGNOSIS — D72819 Decreased white blood cell count, unspecified: Secondary | ICD-10-CM

## 2022-02-09 MED ORDER — SODIUM CHLORIDE 0.9 % IV SOLN: INTRAVENOUS | Status: DC

## 2022-02-09 MED ORDER — SODIUM CHLORIDE 0.9 % IV SOLN
200.0000 mg | Freq: Once | INTRAVENOUS | Status: AC
  Administered 2022-02-09: 200 mg via INTRAVENOUS
  Filled 2022-02-09: qty 10

## 2022-02-09 NOTE — Patient Instructions (Signed)

## 2022-02-17 DIAGNOSIS — H11233 Symblepharon, bilateral: Secondary | ICD-10-CM | POA: Diagnosis not present

## 2022-02-17 DIAGNOSIS — R7982 Elevated C-reactive protein (CRP): Secondary | ICD-10-CM | POA: Diagnosis not present

## 2022-02-17 DIAGNOSIS — H04123 Dry eye syndrome of bilateral lacrimal glands: Secondary | ICD-10-CM | POA: Diagnosis not present

## 2022-02-17 DIAGNOSIS — B301 Conjunctivitis due to adenovirus: Secondary | ICD-10-CM | POA: Diagnosis not present

## 2022-02-17 DIAGNOSIS — R760 Raised antibody titer: Secondary | ICD-10-CM | POA: Diagnosis not present

## 2022-02-17 DIAGNOSIS — H0100B Unspecified blepharitis left eye, upper and lower eyelids: Secondary | ICD-10-CM | POA: Diagnosis not present

## 2022-02-17 DIAGNOSIS — H0288A Meibomian gland dysfunction right eye, upper and lower eyelids: Secondary | ICD-10-CM | POA: Diagnosis not present

## 2022-02-17 DIAGNOSIS — R700 Elevated erythrocyte sedimentation rate: Secondary | ICD-10-CM | POA: Diagnosis not present

## 2022-02-17 DIAGNOSIS — H0100A Unspecified blepharitis right eye, upper and lower eyelids: Secondary | ICD-10-CM | POA: Diagnosis not present

## 2022-02-17 DIAGNOSIS — H0288B Meibomian gland dysfunction left eye, upper and lower eyelids: Secondary | ICD-10-CM | POA: Diagnosis not present

## 2022-02-17 DIAGNOSIS — H2513 Age-related nuclear cataract, bilateral: Secondary | ICD-10-CM | POA: Diagnosis not present

## 2022-02-22 DIAGNOSIS — E1165 Type 2 diabetes mellitus with hyperglycemia: Secondary | ICD-10-CM | POA: Diagnosis not present

## 2022-02-23 DIAGNOSIS — K219 Gastro-esophageal reflux disease without esophagitis: Secondary | ICD-10-CM | POA: Diagnosis not present

## 2022-02-23 DIAGNOSIS — E1165 Type 2 diabetes mellitus with hyperglycemia: Secondary | ICD-10-CM | POA: Diagnosis not present

## 2022-02-23 DIAGNOSIS — E7800 Pure hypercholesterolemia, unspecified: Secondary | ICD-10-CM | POA: Diagnosis not present

## 2022-02-23 DIAGNOSIS — M170 Bilateral primary osteoarthritis of knee: Secondary | ICD-10-CM | POA: Diagnosis not present

## 2022-02-24 DIAGNOSIS — R350 Frequency of micturition: Secondary | ICD-10-CM | POA: Diagnosis not present

## 2022-02-28 DIAGNOSIS — Z87448 Personal history of other diseases of urinary system: Secondary | ICD-10-CM | POA: Diagnosis not present

## 2022-02-28 DIAGNOSIS — R300 Dysuria: Secondary | ICD-10-CM | POA: Diagnosis not present

## 2022-02-28 DIAGNOSIS — R82998 Other abnormal findings in urine: Secondary | ICD-10-CM | POA: Diagnosis not present

## 2022-02-28 DIAGNOSIS — N3010 Interstitial cystitis (chronic) without hematuria: Secondary | ICD-10-CM | POA: Diagnosis not present

## 2022-02-28 DIAGNOSIS — R3915 Urgency of urination: Secondary | ICD-10-CM | POA: Diagnosis not present

## 2022-03-01 DIAGNOSIS — M25512 Pain in left shoulder: Secondary | ICD-10-CM | POA: Diagnosis not present

## 2022-03-02 DIAGNOSIS — M159 Polyosteoarthritis, unspecified: Secondary | ICD-10-CM | POA: Diagnosis not present

## 2022-03-02 DIAGNOSIS — Z79899 Other long term (current) drug therapy: Secondary | ICD-10-CM | POA: Diagnosis not present

## 2022-03-02 DIAGNOSIS — R768 Other specified abnormal immunological findings in serum: Secondary | ICD-10-CM | POA: Diagnosis not present

## 2022-03-02 DIAGNOSIS — H11233 Symblepharon, bilateral: Secondary | ICD-10-CM | POA: Diagnosis not present

## 2022-03-02 DIAGNOSIS — M797 Fibromyalgia: Secondary | ICD-10-CM | POA: Diagnosis not present

## 2022-03-09 DIAGNOSIS — Z1231 Encounter for screening mammogram for malignant neoplasm of breast: Secondary | ICD-10-CM | POA: Diagnosis not present

## 2022-03-10 ENCOUNTER — Encounter: Payer: Self-pay | Admitting: Obstetrics and Gynecology

## 2022-03-13 DIAGNOSIS — M25512 Pain in left shoulder: Secondary | ICD-10-CM | POA: Diagnosis not present

## 2022-03-14 ENCOUNTER — Inpatient Hospital Stay: Payer: Medicare HMO | Admitting: Hematology & Oncology

## 2022-03-14 ENCOUNTER — Telehealth: Payer: Self-pay | Admitting: *Deleted

## 2022-03-14 ENCOUNTER — Inpatient Hospital Stay: Payer: Medicare HMO

## 2022-03-14 ENCOUNTER — Encounter: Payer: Self-pay | Admitting: Hematology & Oncology

## 2022-03-14 ENCOUNTER — Inpatient Hospital Stay: Payer: Medicare HMO | Attending: Hematology & Oncology

## 2022-03-14 VITALS — BP 125/57 | HR 62 | Temp 98.3°F | Resp 17 | Wt 172.2 lb

## 2022-03-14 DIAGNOSIS — K909 Intestinal malabsorption, unspecified: Secondary | ICD-10-CM | POA: Insufficient documentation

## 2022-03-14 DIAGNOSIS — D508 Other iron deficiency anemias: Secondary | ICD-10-CM | POA: Diagnosis not present

## 2022-03-14 DIAGNOSIS — D46Z Other myelodysplastic syndromes: Secondary | ICD-10-CM | POA: Diagnosis not present

## 2022-03-14 DIAGNOSIS — D4620 Refractory anemia with excess of blasts, unspecified: Secondary | ICD-10-CM | POA: Insufficient documentation

## 2022-03-14 LAB — CMP (CANCER CENTER ONLY)
ALT: 12 U/L (ref 0–44)
AST: 13 U/L — ABNORMAL LOW (ref 15–41)
Albumin: 3.9 g/dL (ref 3.5–5.0)
Alkaline Phosphatase: 76 U/L (ref 38–126)
Anion gap: 8 (ref 5–15)
BUN: 19 mg/dL (ref 8–23)
CO2: 27 mmol/L (ref 22–32)
Calcium: 9.9 mg/dL (ref 8.9–10.3)
Chloride: 103 mmol/L (ref 98–111)
Creatinine: 0.73 mg/dL (ref 0.44–1.00)
GFR, Estimated: >60 mL/min (ref >60)
Glucose, Bld: 268 mg/dL — ABNORMAL HIGH (ref 70–99)
Potassium: 4.2 mmol/L (ref 3.5–5.1)
Sodium: 138 mmol/L (ref 135–145)
Total Bilirubin: 0.3 mg/dL (ref 0.3–1.2)
Total Protein: 7.7 g/dL (ref 6.5–8.1)

## 2022-03-14 LAB — SAVE SMEAR(SSMR), FOR PROVIDER SLIDE REVIEW

## 2022-03-14 LAB — IRON AND IRON BINDING CAPACITY (CC-WL,HP ONLY)
Iron: 54 ug/dL (ref 28–170)
Saturation Ratios: 21 % (ref 10.4–31.8)
TIBC: 259 ug/dL (ref 250–450)
UIBC: 205 ug/dL (ref 148–442)

## 2022-03-14 LAB — FERRITIN: Ferritin: 653 ng/mL — ABNORMAL HIGH (ref 11–307)

## 2022-03-14 LAB — CBC WITH DIFFERENTIAL (CANCER CENTER ONLY)
Abs Immature Granulocytes: 0.01 10*3/uL (ref 0.00–0.07)
Basophils Absolute: 0 10*3/uL (ref 0.0–0.1)
Basophils Relative: 1 %
Eosinophils Absolute: 0 10*3/uL (ref 0.0–0.5)
Eosinophils Relative: 1 %
HCT: 36.8 % (ref 36.0–46.0)
Hemoglobin: 11.9 g/dL — ABNORMAL LOW (ref 12.0–15.0)
Immature Granulocytes: 1 %
Lymphocytes Relative: 66 %
Lymphs Abs: 1.5 10*3/uL (ref 0.7–4.0)
MCH: 28.7 pg (ref 26.0–34.0)
MCHC: 32.3 g/dL (ref 30.0–36.0)
MCV: 88.9 fL (ref 80.0–100.0)
Monocytes Absolute: 0.3 10*3/uL (ref 0.1–1.0)
Monocytes Relative: 12 %
Neutro Abs: 0.4 10*3/uL — CL (ref 1.7–7.7)
Neutrophils Relative %: 19 %
Platelet Count: 169 10*3/uL (ref 150–400)
RBC: 4.14 MIL/uL (ref 3.87–5.11)
RDW: 16.4 % — ABNORMAL HIGH (ref 11.5–15.5)
WBC Count: 2.2 10*3/uL — ABNORMAL LOW (ref 4.0–10.5)
nRBC: 0 % (ref 0.0–0.2)

## 2022-03-14 LAB — RETICULOCYTES
Immature Retic Fract: 13.1 % (ref 2.3–15.9)
RBC.: 4.13 MIL/uL (ref 3.87–5.11)
Retic Count, Absolute: 71.9 10*3/uL (ref 19.0–186.0)
Retic Ct Pct: 1.7 % (ref 0.4–3.1)

## 2022-03-14 NOTE — Progress Notes (Signed)
?Hematology and Oncology Follow Up Visit ? ?Kristen Murray ?[id number] ?[birth year] 76 y.o. ?03/14/2022 ? ? 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 ?Current Therapy:   ?IV iron as needed-dose of Feraheme given on 09/12/2021  ?Neulasta 6 mcg sq PRN ?Aranesp 300 mcg sq for Hgb < 11 ?    ?Interim History:  Kristen Murray is back for follow-up.  She is getting ready to go to [place].  I think she leaves on the 26th.  To be gone for 2 weeks. ? ?Her blood count is looks good.  Her hemoglobin certainly is no problem.  She does not need any Aranesp. ? ?White cell count is still on the low side.  This is dropping slowly.  She has had no problems infections.  She had no problems with rashes.  Is been no cough or shortness of breath.  She has had no change in bowel or bladder habits. ? ?Her blood sugars are still on the high side.  I think that her diabetes is probably can be her biggest long-term problem. ? ?She has had no leg swelling.  She has had no weakness. ? ?Overall, I would say performance status is probably can be ECOG 1.   ? ?Medications:  ?Current Outpatient Medications:  ?  acetaminophen (TYLENOL) 325 MG tablet, Take 325-650 mg by mouth every 6 (six) hours as needed (for pain)., Disp: , Rfl:  ?  ALPRAZolam (XANAX) 0.25 MG tablet, Take 0.25 mg by mouth 2 (two) times daily as needed., Disp: , Rfl:  ?  apixaban (ELIQUIS) 5 MG TABS tablet, Take 1 tablet (5 mg total) by mouth 2 (two) times daily., Disp: 180 tablet, Rfl: 1 ?  b complex vitamins capsule, daily., Disp: , Rfl:  ?  Cholecalciferol 25 MCG (1000 UT) capsule, Take 1,000 Units by mouth daily., Disp: , Rfl:  ?  clotrimazole-betamethasone (LOTRISONE) cream, Apply 1 application topically 2 (two) times daily. Use for 2 weeks., Disp: 30 g, Rfl: 0 ?  cyclobenzaprine (FLEXERIL) 10 MG tablet, Take 2.5 mg by mouth daily as needed for muscle spasms., Disp: , Rfl:  ?  diclofenac (VOLTAREN) 0.1 %  ophthalmic solution, 4 (four) times daily as needed., Disp: , Rfl:  ?  EPINEPHrine 0.3 mg/0.3 mL IJ SOAJ injection, Inject 0.3 mg into the muscle as needed for anaphylaxis., Disp: 1 each, Rfl: 1 ?  fluorometholone (FML) 0.1 % ophthalmic suspension, Place 1 drop into both eyes every Monday, Wednesday, and Friday. , Disp: , Rfl:  ?  fluticasone (FLONASE) 50 MCG/ACT nasal spray, Place 1 spray into both nostrils 2 (two) times daily., Disp: 48 g, Rfl: 1 ?  folic acid (FOLVITE) 409 MCG tablet, daily., Disp: , Rfl:  ?  glipiZIDE (GLUCOTROL XL) 5 MG 24 hr tablet, Take 5 mg by mouth daily., Disp: , Rfl:  ?  glucose blood (FREESTYLE LITE) test strip, CHECK BLOOD SUGAR ONCE DAILY AS DIRECTED.  DX:790.29, Disp: 100 each, Rfl: 12 ?  glucose blood test strip, Inject 1 strip into the skin daily., Disp: , Rfl:  ?  ipratropium (ATROVENT) 0.03 % nasal spray, 2 sprays in each nostril every 6 hours as directed, Disp: 90 mL, Rfl: 1 ?  Lancets (FREESTYLE) lancets, Check blood sugar once daily as directed [id number] (FREESTYLE FREEDOM LITE), Disp: 100 each, Rfl: 1 ?  lidocaine (XYLOCAINE) 5 % ointment, Apply 1 application topically 3 (three) times daily as needed., Disp: 1.25 g, Rfl:  0 ?  loratadine (CLARITIN) 10 MG tablet, Take 1 tablet (10 mg total) by mouth daily., Disp: 30 tablet, Rfl: 0 ?  metoprolol tartrate (LOPRESSOR) 50 MG tablet, Take 1.5 tablets (75 mg total) by mouth 2 (two) times daily., Disp: 270 tablet, Rfl: 3 ?  montelukast (SINGULAIR) 10 MG tablet, Take 1 tablet (10 mg total) by mouth at bedtime., Disp: 90 tablet, Rfl: 1 ?  Multiple Vitamin (MULTIVITAMIN) tablet, Take 1 tablet by mouth daily. Shaklee brand, Disp: , Rfl:  ?  nitroGLYCERIN (NITROSTAT) 0.4 MG SL tablet, Place 1 tablet (0.4 mg total) under the tongue every 5 (five) minutes as needed for chest pain., Disp: 25 tablet, Rfl: 3 ?  pantoprazole (PROTONIX) 40 MG tablet, Take 40 mg by mouth 2 (two) times daily., Disp: , Rfl:  ?  rosuvastatin (CRESTOR) 5 MG tablet,  Take 5 mg by mouth See admin instructions. Take 5 mg by mouth at bedtime on Mondays and Thursdays..Currently on hold, Disp: , Rfl:  ?  tretinoin (RETIN-A) 0.025 % cream, Apply 1 application. topically at bedtime., Disp: , Rfl:  ?  trolamine salicylate (ASPERCREME) 10 % cream, Apply 1 application topically as needed for muscle pain., Disp: , Rfl:  ?  valACYclovir (VALTREX) 500 MG tablet, Take 1 tablet (500 mg total) by mouth 2 (two) times daily. Take twice a day for thee days as needed for an outbreak., Disp: 30 tablet, Rfl: 1 ?  verapamil (CALAN-SR) 180 MG CR tablet, Take 180 mg by mouth at bedtime. , Disp: , Rfl:  ? ?Allergies:  ?Allergies  ?Allergen Reactions  ? Codeine Hives and Other (See Comments)  ?  Because of a history of documented adverse serious drug reaction, Medi Alert bracelet  is recommended  ? Macrodantin [Nitrofurantoin Macrocrystal] Other (See Comments)  ?  Numbness, aching all over  ? Other Shortness Of Breath, Rash and Other (See Comments)  ?  Pine nuts & walnuts throat congestion. No documented angioedema. ?  ? Propoxyphene Hives, Swelling, Rash and Other (See Comments)  ? Propoxyphene Hcl Hives, Swelling and Rash  ? Dulaglutide Other (See Comments)  ?  Other reaction(s): weak, ha, no appetite  ? Latex Rash and Other (See Comments)  ? Metformin Hcl Other (See Comments)  ?  Other reaction(s): leg discomfort  ? Neosporin [Bacitracin-Polymyxin B] Other (See Comments)  ?  Blisters  ? Zoster Vac Recomb Adjuvanted Rash and Other (See Comments)  ?  Other reaction(s): confusion  ? Avocado Rash  ? Banana Rash  ? Ciprofloxacin Nausea Only and Other (See Comments)  ?  Nausea (Note: Patient takes this when on mission trips, however)  ? Tizanidine Other (See Comments)  ?  Reaction not recalled  ? Tramadol Nausea Only  ? ? ?Past Medical History, Surgical history, Social history, and Family History were reviewed and updated. ? ?Review of Systems: ?Review of Systems  ?Constitutional: Negative.   ?HENT:   Negative.    ?Eyes: Negative.   ?Respiratory: Negative.    ?Cardiovascular: Negative.   ?Gastrointestinal: Negative.   ?Endocrine: Negative.   ?Genitourinary: Negative.    ?Musculoskeletal: Negative.   ?Skin: Negative.   ?Neurological: Negative.   ?Hematological: Negative.   ?Psychiatric/Behavioral: Negative.    ? ?Physical Exam: ? weight is 172 lb 3.2 oz (78.1 kg). Her oral temperature is 98.3 ?F (36.8 ?C). Her blood pressure is 125/57 (abnormal) and her pulse is 62. Her respiration is 17 and oxygen saturation is 100%.  ? ?Wt Readings from Last 3 Encounters:  ?  03/14/22 172 lb 3.2 oz (78.1 kg)  ?02/02/22 170 lb (77.1 kg)  ?01/04/22 167 lb (75.8 kg)  ? ? ?Physical Exam ?Vitals reviewed.  ?HENT:  ?   Head: Normocephalic and atraumatic.  ?Eyes:  ?   Pupils: Pupils are equal, round, and reactive to light.  ?Cardiovascular:  ?   Rate and Rhythm: Normal rate and regular rhythm.  ?   Heart sounds: Normal heart sounds.  ?Pulmonary:  ?   Effort: Pulmonary effort is normal.  ?   Breath sounds: Normal breath sounds.  ?Abdominal:  ?   General: Bowel sounds are normal.  ?   Palpations: Abdomen is soft.  ?Musculoskeletal:     ?   General: No tenderness or deformity. Normal range of motion.  ?   Cervical back: Normal range of motion.  ?   Comments: There is moderate swelling of the left calf area.  This is somewhat tender to palpation.  There is no redness.  I cannot palpate any obvious venous cord.  There is no pain behind the left knee.  She has decent pulses in her distal extremities.  ?Lymphadenopathy:  ?   Cervical: No cervical adenopathy.  ?Skin: ?   General: Skin is warm and dry.  ?   Findings: No erythema or rash.  ?Neurological:  ?   Mental Status: She is alert and oriented to person, place, and time.  ?Psychiatric:     ?   Behavior: Behavior normal.     ?   Thought Content: Thought content normal.     ?   Judgment: Judgment normal.  ? ? ? ?Lab Results  ?Component Value Date  ? WBC 2.2 (L) 03/14/2022  ? HGB 11.9 (L)  03/14/2022  ? HCT 36.8 03/14/2022  ? MCV 88.9 03/14/2022  ? PLT 169 03/14/2022  ? ?  Chemistry   ?   ?Component Value Date/Time  ? NA 138 03/14/2022 1035  ? NA 138 03/10/2020 1138  ? K 4.2 03/14/2022 1035  ? CL 103 05/

## 2022-03-14 NOTE — Telephone Encounter (Signed)
Dr. Ennever notified of ANC-0.4.  No new orders received at this time.  

## 2022-03-17 DIAGNOSIS — M75110 Incomplete rotator cuff tear or rupture of unspecified shoulder, not specified as traumatic: Secondary | ICD-10-CM | POA: Insufficient documentation

## 2022-03-17 DIAGNOSIS — M25512 Pain in left shoulder: Secondary | ICD-10-CM | POA: Diagnosis not present

## 2022-03-17 DIAGNOSIS — M75112 Incomplete rotator cuff tear or rupture of left shoulder, not specified as traumatic: Secondary | ICD-10-CM | POA: Diagnosis not present

## 2022-03-23 ENCOUNTER — Inpatient Hospital Stay: Payer: Medicare HMO

## 2022-03-23 VITALS — BP 135/69 | HR 76 | Temp 97.3°F | Resp 16

## 2022-03-23 DIAGNOSIS — D46Z Other myelodysplastic syndromes: Secondary | ICD-10-CM

## 2022-03-23 DIAGNOSIS — D500 Iron deficiency anemia secondary to blood loss (chronic): Secondary | ICD-10-CM

## 2022-03-23 DIAGNOSIS — D4620 Refractory anemia with excess of blasts, unspecified: Secondary | ICD-10-CM | POA: Diagnosis not present

## 2022-03-23 DIAGNOSIS — K909 Intestinal malabsorption, unspecified: Secondary | ICD-10-CM | POA: Diagnosis not present

## 2022-03-23 DIAGNOSIS — D72819 Decreased white blood cell count, unspecified: Secondary | ICD-10-CM

## 2022-03-23 DIAGNOSIS — D508 Other iron deficiency anemias: Secondary | ICD-10-CM | POA: Diagnosis not present

## 2022-03-23 MED ORDER — FILGRASTIM-SNDZ 300 MCG/0.5ML IJ SOSY: 300.0000 ug | PREFILLED_SYRINGE | Freq: Once | INTRAMUSCULAR | Status: DC

## 2022-03-23 MED ORDER — PEGFILGRASTIM-CBQV 6 MG/0.6ML ~~LOC~~ SOSY
6.0000 mg | PREFILLED_SYRINGE | Freq: Once | SUBCUTANEOUS | Status: AC
  Administered 2022-03-23: 6 mg via SUBCUTANEOUS
  Filled 2022-03-23: qty 0.6

## 2022-03-23 NOTE — Patient Instructions (Signed)

## 2022-04-06 ENCOUNTER — Ambulatory Visit
Admission: RE | Admit: 2022-04-06 | Discharge: 2022-04-06 | Disposition: A | Discharge status: Home / Self Care | Payer: Medicare HMO | Source: Ambulatory Visit | Attending: Physician Assistant | Admitting: Physician Assistant

## 2022-04-06 ENCOUNTER — Other Ambulatory Visit: Payer: Self-pay | Admitting: Physician Assistant

## 2022-04-06 DIAGNOSIS — R102 Pelvic and perineal pain: Secondary | ICD-10-CM | POA: Diagnosis not present

## 2022-04-06 DIAGNOSIS — M545 Low back pain, unspecified: Secondary | ICD-10-CM | POA: Diagnosis not present

## 2022-04-06 DIAGNOSIS — R609 Edema, unspecified: Secondary | ICD-10-CM | POA: Diagnosis not present

## 2022-04-06 DIAGNOSIS — K5900 Constipation, unspecified: Secondary | ICD-10-CM

## 2022-04-06 DIAGNOSIS — M5136 Other intervertebral disc degeneration, lumbar region: Secondary | ICD-10-CM | POA: Diagnosis not present

## 2022-04-07 DIAGNOSIS — M25512 Pain in left shoulder: Secondary | ICD-10-CM | POA: Diagnosis not present

## 2022-04-10 DIAGNOSIS — Z91040 Latex allergy status: Secondary | ICD-10-CM | POA: Diagnosis not present

## 2022-04-10 DIAGNOSIS — Z881 Allergy status to other antibiotic agents status: Secondary | ICD-10-CM | POA: Diagnosis not present

## 2022-04-10 DIAGNOSIS — I4892 Unspecified atrial flutter: Secondary | ICD-10-CM | POA: Diagnosis not present

## 2022-04-10 DIAGNOSIS — G252 Other specified forms of tremor: Secondary | ICD-10-CM | POA: Diagnosis not present

## 2022-04-10 DIAGNOSIS — Z885 Allergy status to narcotic agent status: Secondary | ICD-10-CM | POA: Diagnosis not present

## 2022-04-10 DIAGNOSIS — G4710 Hypersomnia, unspecified: Secondary | ICD-10-CM | POA: Diagnosis not present

## 2022-04-10 DIAGNOSIS — G43909 Migraine, unspecified, not intractable, without status migrainosus: Secondary | ICD-10-CM | POA: Diagnosis not present

## 2022-04-10 DIAGNOSIS — G43009 Migraine without aura, not intractable, without status migrainosus: Secondary | ICD-10-CM | POA: Diagnosis not present

## 2022-04-10 DIAGNOSIS — G4733 Obstructive sleep apnea (adult) (pediatric): Secondary | ICD-10-CM | POA: Diagnosis not present

## 2022-04-10 DIAGNOSIS — Z79899 Other long term (current) drug therapy: Secondary | ICD-10-CM | POA: Diagnosis not present

## 2022-04-11 DIAGNOSIS — J310 Chronic rhinitis: Secondary | ICD-10-CM | POA: Diagnosis not present

## 2022-04-11 DIAGNOSIS — J300 Vasomotor rhinitis: Secondary | ICD-10-CM | POA: Diagnosis not present

## 2022-04-12 DIAGNOSIS — M25512 Pain in left shoulder: Secondary | ICD-10-CM | POA: Diagnosis not present

## 2022-04-13 DIAGNOSIS — M19012 Primary osteoarthritis, left shoulder: Secondary | ICD-10-CM | POA: Diagnosis not present

## 2022-04-13 DIAGNOSIS — M25512 Pain in left shoulder: Secondary | ICD-10-CM | POA: Diagnosis not present

## 2022-04-17 DIAGNOSIS — R109 Unspecified abdominal pain: Secondary | ICD-10-CM | POA: Diagnosis not present

## 2022-04-17 DIAGNOSIS — R399 Unspecified symptoms and signs involving the genitourinary system: Secondary | ICD-10-CM | POA: Diagnosis not present

## 2022-04-17 DIAGNOSIS — R3915 Urgency of urination: Secondary | ICD-10-CM | POA: Diagnosis not present

## 2022-04-18 ENCOUNTER — Telehealth: Payer: Self-pay

## 2022-04-18 NOTE — Telephone Encounter (Signed)
Spoke with pt regarding price of Eliquis. Pt was advise to pick up a pt assistance form, which would be left at the front desk for her.

## 2022-04-19 DIAGNOSIS — M25512 Pain in left shoulder: Secondary | ICD-10-CM | POA: Diagnosis not present

## 2022-04-19 NOTE — Telephone Encounter (Signed)
FYI pt also asked for samples of Eliquis 5 mg tablets. I gave pt 1 week of samples until pt can bring the pt assistance application back.  Lot# [id number]  Exp: 12/2023

## 2022-04-21 DIAGNOSIS — M25512 Pain in left shoulder: Secondary | ICD-10-CM | POA: Diagnosis not present

## 2022-04-24 DIAGNOSIS — M25512 Pain in left shoulder: Secondary | ICD-10-CM | POA: Diagnosis not present

## 2022-04-26 DIAGNOSIS — M25512 Pain in left shoulder: Secondary | ICD-10-CM | POA: Diagnosis not present

## 2022-04-27 ENCOUNTER — Other Ambulatory Visit: Payer: Self-pay

## 2022-04-27 ENCOUNTER — Encounter: Payer: Self-pay | Admitting: Hematology & Oncology

## 2022-04-27 ENCOUNTER — Inpatient Hospital Stay: Payer: Medicare HMO

## 2022-04-27 ENCOUNTER — Encounter: Payer: Self-pay | Admitting: *Deleted

## 2022-04-27 ENCOUNTER — Telehealth: Payer: Self-pay | Admitting: *Deleted

## 2022-04-27 ENCOUNTER — Inpatient Hospital Stay: Payer: Medicare HMO | Attending: Hematology & Oncology

## 2022-04-27 ENCOUNTER — Inpatient Hospital Stay: Payer: Medicare HMO | Admitting: Hematology & Oncology

## 2022-04-27 VITALS — BP 122/84 | HR 69 | Temp 97.6°F | Resp 18 | Ht 64.0 in | Wt 168.1 lb

## 2022-04-27 DIAGNOSIS — D46Z Other myelodysplastic syndromes: Secondary | ICD-10-CM

## 2022-04-27 DIAGNOSIS — D508 Other iron deficiency anemias: Secondary | ICD-10-CM | POA: Diagnosis not present

## 2022-04-27 DIAGNOSIS — M7989 Other specified soft tissue disorders: Secondary | ICD-10-CM | POA: Diagnosis not present

## 2022-04-27 DIAGNOSIS — K909 Intestinal malabsorption, unspecified: Secondary | ICD-10-CM | POA: Diagnosis not present

## 2022-04-27 DIAGNOSIS — E119 Type 2 diabetes mellitus without complications: Secondary | ICD-10-CM | POA: Diagnosis not present

## 2022-04-27 DIAGNOSIS — D4620 Refractory anemia with excess of blasts, unspecified: Secondary | ICD-10-CM | POA: Insufficient documentation

## 2022-04-27 DIAGNOSIS — Z79899 Other long term (current) drug therapy: Secondary | ICD-10-CM | POA: Diagnosis not present

## 2022-04-27 LAB — RETICULOCYTES
Immature Retic Fract: 7 % (ref 2.3–15.9)
RBC.: 4.08 MIL/uL (ref 3.87–5.11)
Retic Count, Absolute: 42.4 10*3/uL (ref 19.0–186.0)
Retic Ct Pct: 1 % (ref 0.4–3.1)

## 2022-04-27 LAB — CMP (CANCER CENTER ONLY)
ALT: 12 U/L (ref 0–44)
AST: 10 U/L — ABNORMAL LOW (ref 15–41)
Albumin: 4.1 g/dL (ref 3.5–5.0)
Alkaline Phosphatase: 72 U/L (ref 38–126)
Anion gap: 6 (ref 5–15)
BUN: 19 mg/dL (ref 8–23)
CO2: 29 mmol/L (ref 22–32)
Calcium: 10 mg/dL (ref 8.9–10.3)
Chloride: 102 mmol/L (ref 98–111)
Creatinine: 0.62 mg/dL (ref 0.44–1.00)
GFR, Estimated: >60 mL/min (ref >60)
Glucose, Bld: 163 mg/dL — ABNORMAL HIGH (ref 70–99)
Potassium: 4.2 mmol/L (ref 3.5–5.1)
Sodium: 137 mmol/L (ref 135–145)
Total Bilirubin: 0.6 mg/dL (ref 0.3–1.2)
Total Protein: 7.5 g/dL (ref 6.5–8.1)

## 2022-04-27 LAB — CBC WITH DIFFERENTIAL (CANCER CENTER ONLY)
Abs Immature Granulocytes: 0.21 10*3/uL — ABNORMAL HIGH (ref 0.00–0.07)
Basophils Absolute: 0 10*3/uL (ref 0.0–0.1)
Basophils Relative: 0 %
Eosinophils Absolute: 0 10*3/uL (ref 0.0–0.5)
Eosinophils Relative: 0 %
HCT: 37 % (ref 36.0–46.0)
Hemoglobin: 11.9 g/dL — ABNORMAL LOW (ref 12.0–15.0)
Immature Granulocytes: 7 %
Lymphocytes Relative: 70 %
Lymphs Abs: 2.2 10*3/uL (ref 0.7–4.0)
MCH: 28.9 pg (ref 26.0–34.0)
MCHC: 32.2 g/dL (ref 30.0–36.0)
MCV: 89.8 fL (ref 80.0–100.0)
Monocytes Absolute: 0.4 10*3/uL (ref 0.1–1.0)
Monocytes Relative: 12 %
Neutro Abs: 0.4 10*3/uL — CL (ref 1.7–7.7)
Neutrophils Relative %: 11 %
Platelet Count: 158 10*3/uL (ref 150–400)
RBC: 4.12 MIL/uL (ref 3.87–5.11)
RDW: 16.3 % — ABNORMAL HIGH (ref 11.5–15.5)
Smear Review: NORMAL
WBC Count: 3 10*3/uL — ABNORMAL LOW (ref 4.0–10.5)
nRBC: 0 % (ref 0.0–0.2)

## 2022-04-27 LAB — IRON AND IRON BINDING CAPACITY (CC-WL,HP ONLY)
Iron: 267 ug/dL — ABNORMAL HIGH (ref 28–170)
Saturation Ratios: 94 % — ABNORMAL HIGH (ref 10.4–31.8)
TIBC: 284 ug/dL (ref 250–450)
UIBC: 17 ug/dL — ABNORMAL LOW (ref 148–442)

## 2022-04-27 LAB — FERRITIN: Ferritin: 744 ng/mL — ABNORMAL HIGH (ref 11–307)

## 2022-04-27 LAB — SAVE SMEAR(SSMR), FOR PROVIDER SLIDE REVIEW

## 2022-04-27 MED ORDER — DARBEPOETIN ALFA 300 MCG/0.6ML IJ SOSY: 300.0000 ug | PREFILLED_SYRINGE | Freq: Once | INTRAMUSCULAR | Status: DC

## 2022-04-27 MED ORDER — SODIUM CHLORIDE 0.9 % IV SOLN: 200.0000 mg | Freq: Once | INTRAVENOUS | Status: DC

## 2022-04-27 MED ORDER — FILGRASTIM-SNDZ 300 MCG/0.5ML IJ SOSY
300.0000 ug | PREFILLED_SYRINGE | Freq: Once | INTRAMUSCULAR | Status: AC
  Filled 2022-04-27: qty 0.5

## 2022-04-27 NOTE — Progress Notes (Signed)
Hematology and Oncology Follow Up Visit  Kristen Murray [id number] 08-29-1946 76 y.o. 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  She had a wonderful time over in [place].  She was over there right after Memorial Day.  She had a good time.  She has good food while she was over there.  She really had no problems with respect to significant fatigue or weakness.  She had no issues with infections.  She had no problems traveling.  We did give her Neulasta right before she went over there.  However, the Neulasta did not seem to make her feel all that good for about 4 days.  She has had no issues with rashes.  There is been no swollen lymph nodes.  She has had no change in bowel or bladder habits..  She does have diabetes.  Her blood sugar today was 168.  This was fasting.  She sees her family doctor regarding this.  We last gave her iron back in April.  At that time, her iron saturation was 10%.  She has not noted any obvious bleeding.  Overall, I would say performance status is probably ECOG 1.  .    Medications:  Current Outpatient Medications:    acetaminophen (TYLENOL) 325 MG tablet, Take 325-650 mg by mouth every 6 (six) hours as needed (for pain)., Disp: , Rfl:    ALPRAZolam (XANAX) 0.25 MG tablet, Take 0.25 mg by mouth 2 (two) times daily as needed., Disp: , Rfl:    apixaban (ELIQUIS) 5 MG TABS tablet, Take 1 tablet (5 mg total) by mouth 2 (two) times daily., Disp: 180 tablet, Rfl: 1   b complex vitamins capsule, daily., Disp: , Rfl:    Cholecalciferol 25 MCG (1000 UT) capsule, Take 1,000 Units by mouth daily., Disp: , Rfl:    clotrimazole-betamethasone (LOTRISONE) cream, Apply 1  application topically 2 (two) times daily. Use for 2 weeks., Disp: 30 g, Rfl: 0   cyclobenzaprine (FLEXERIL) 10 MG tablet, Take 2.5 mg by mouth daily as needed for muscle spasms., Disp: , Rfl:    diclofenac (VOLTAREN) 0.1 % ophthalmic solution, 4 (four) times daily as needed., Disp: , Rfl:    fluorometholone (FML) 0.1 % ophthalmic suspension, Place 1 drop into both eyes every Monday, Wednesday, and Friday. , Disp: , Rfl:    fluticasone (FLONASE) 50 MCG/ACT nasal spray, Place 1 spray into both nostrils 2 (two) times daily., Disp: 48 g, Rfl: 1   folic acid (FOLVITE) 128 MCG tablet, daily., Disp: , Rfl:    glipiZIDE (GLUCOTROL XL) 5 MG 24 hr tablet, Take 5 mg by mouth daily., Disp: , Rfl:    glucose blood (FREESTYLE LITE) test strip, CHECK BLOOD SUGAR ONCE DAILY AS DIRECTED.  DX:790.29, Disp: 100 each, Rfl: 12   glucose blood test strip, Inject 1 strip into the skin daily., Disp: , Rfl:    ipratropium (ATROVENT) 0.03 % nasal spray, 2 sprays in each nostril every 6 hours as directed, Disp: 90 mL, Rfl: 1   Lancets (FREESTYLE) lancets, Check blood sugar once daily as directed DX:790.29 (FREESTYLE FREEDOM LITE), Disp: 100 each, Rfl: 1   lidocaine (XYLOCAINE)  5 % ointment, Apply 1 application topically 3 (three) times daily as needed., Disp: 1.25 g, Rfl: 0   loratadine (CLARITIN) 10 MG tablet, Take 1 tablet (10 mg total) by mouth daily., Disp: 30 tablet, Rfl: 0   metoprolol tartrate (LOPRESSOR) 50 MG tablet, Take 1.5 tablets (75 mg total) by mouth 2 (two) times daily., Disp: 270 tablet, Rfl: 3   montelukast (SINGULAIR) 10 MG tablet, Take 1 tablet (10 mg total) by mouth at bedtime., Disp: 90 tablet, Rfl: 1   Multiple Vitamin (MULTIVITAMIN) tablet, Take 1 tablet by mouth daily. Shaklee brand, Disp: , Rfl:    pantoprazole (PROTONIX) 40 MG tablet, Take 40 mg by mouth 2 (two) times daily., Disp: , Rfl:    rosuvastatin (CRESTOR) 5 MG tablet, Take 5 mg by mouth See admin instructions. Take 5 mg by mouth at bedtime  on Mondays and Thursdays..Currently on hold, Disp: , Rfl:    tretinoin (RETIN-A) 0.025 % cream, Apply 1 application. topically at bedtime., Disp: , Rfl:    trolamine salicylate (ASPERCREME) 10 % cream, Apply 1 application topically as needed for muscle pain., Disp: , Rfl:    valACYclovir (VALTREX) 500 MG tablet, Take 1 tablet (500 mg total) by mouth 2 (two) times daily. Take twice a day for thee days as needed for an outbreak., Disp: 30 tablet, Rfl: 1   verapamil (CALAN-SR) 180 MG CR tablet, Take 180 mg by mouth at bedtime. , Disp: , Rfl:    EPINEPHrine 0.3 mg/0.3 mL IJ SOAJ injection, Inject 0.3 mg into the muscle as needed for anaphylaxis. (Patient not taking: Reported on 04/27/2022), Disp: 1 each, Rfl: 1   nitroGLYCERIN (NITROSTAT) 0.4 MG SL tablet, Place 1 tablet (0.4 mg total) under the tongue every 5 (five) minutes as needed for chest pain. (Patient not taking: Reported on 04/27/2022), Disp: 25 tablet, Rfl: 3  Allergies:  Allergies  Allergen Reactions   Codeine Hives and Other (See Comments)    Because of a history of documented adverse serious drug reaction, Medi Alert bracelet  is recommended   Macrodantin [Nitrofurantoin Macrocrystal] Other (See Comments)    Numbness, aching all over   Other Shortness Of Breath, Rash and Other (See Comments)    Pine nuts & walnuts throat congestion. No documented angioedema.    Propoxyphene Hives, Swelling, Rash and Other (See Comments)   Propoxyphene Hcl Hives, Swelling and Rash   Dulaglutide Other (See Comments)    Other reaction(s): weak, ha, no appetite   Latex Rash and Other (See Comments)   Metformin Hcl Other (See Comments)    Other reaction(s): leg discomfort   Neosporin [Bacitracin-Polymyxin B] Other (See Comments)    Blisters   Zoster Vac Recomb Adjuvanted Rash and Other (See Comments)    Other reaction(s): confusion   Avocado Rash   Banana Rash   Ciprofloxacin Nausea Only and Other (See Comments)    Nausea (Note: Patient takes this  when on mission trips, however)   Tizanidine Other (See Comments)    Reaction not recalled   Tramadol Nausea Only    Past Medical History, Surgical history, Social history, and Family History were reviewed and updated.  Review of Systems: Review of Systems  Constitutional: Negative.   HENT:  Negative.    Eyes: Negative.   Respiratory: Negative.    Cardiovascular: Negative.   Gastrointestinal: Negative.   Endocrine: Negative.   Genitourinary: Negative.    Musculoskeletal: Negative.   Skin: Negative.   Neurological: Negative.   Hematological: Negative.  Psychiatric/Behavioral: Negative.      Physical Exam:  height is '5\' 4"'$  (1.626 m) and weight is 168 lb 1.3 oz (76.2 kg). Her oral temperature is 97.6 F (36.4 C). Her blood pressure is 122/84 and her pulse is 69. Her respiration is 18 and oxygen saturation is 95%.   Wt Readings from Last 3 Encounters:  04/27/22 168 lb 1.3 oz (76.2 kg)  03/14/22 172 lb 3.2 oz (78.1 kg)  02/02/22 170 lb (77.1 kg)    Physical Exam Vitals reviewed.  HENT:     Head: Normocephalic and atraumatic.  Eyes:     Pupils: Pupils are equal, round, and reactive to light.  Cardiovascular:     Rate and Rhythm: Normal rate and regular rhythm.     Heart sounds: Normal heart sounds.  Pulmonary:     Effort: Pulmonary effort is normal.     Breath sounds: Normal breath sounds.  Abdominal:     General: Bowel sounds are normal.     Palpations: Abdomen is soft.  Musculoskeletal:        General: No tenderness or deformity. Normal range of motion.     Cervical back: Normal range of motion.     Comments: There is moderate swelling of the left calf area.  This is somewhat tender to palpation.  There is no redness.  I cannot palpate any obvious venous cord.  There is no pain behind the left knee.  She has decent pulses in her distal extremities.  Lymphadenopathy:     Cervical: No cervical adenopathy.  Skin:    General: Skin is warm and dry.     Findings: No  erythema or rash.  Neurological:     Mental Status: She is alert and oriented to person, place, and time.  Psychiatric:        Behavior: Behavior normal.        Thought Content: Thought content normal.        Judgment: Judgment normal.     Lab Results  Component Value Date   WBC 3.0 (L) 04/27/2022   HGB 11.9 (L) 04/27/2022   HCT 37.0 04/27/2022   MCV 89.8 04/27/2022   PLT 158 04/27/2022     Chemistry      Component Value Date/Time   NA 137 04/27/2022 1002   NA 138 03/10/2020 1138   K 4.2 04/27/2022 1002   CL 102 04/27/2022 1002   CO2 29 04/27/2022 1002   BUN 19 04/27/2022 1002   BUN 10 03/10/2020 1138   CREATININE 0.62 04/27/2022 1002      Component Value Date/Time   CALCIUM 10.0 04/27/2022 1002   ALKPHOS 72 04/27/2022 1002   AST 10 (L) 04/27/2022 1002   ALT 12 04/27/2022 1002   BILITOT 0.6 04/27/2022 1002       Impression and Plan:  Kristen Murray is a 76 year old African-American female.   Looks like she does have a low-grade myelodysplasia.  Again she has a very low IPSS-R score.  She does not need any Aranesp today.  She does not wish to have any Neulasta today.  I agree with this.  Her white cell count certainly is doing better than I would have thought.  For right now, we will see what her iron studies look like.  I am just glad that she was able to go over to [place] and have a good time while she was over there.  We will plan to get her back after Labor Day.  I think  we can get her through the Summer.   [name], MD 6/29/202311:16 AM

## 2022-04-27 NOTE — Telephone Encounter (Signed)
Dr. Ennever notified of ANC-0.4.  No new orders received at this time.  

## 2022-05-01 DIAGNOSIS — M25512 Pain in left shoulder: Secondary | ICD-10-CM | POA: Diagnosis not present

## 2022-05-05 DIAGNOSIS — M25512 Pain in left shoulder: Secondary | ICD-10-CM | POA: Diagnosis not present

## 2022-05-10 ENCOUNTER — Other Ambulatory Visit: Payer: Self-pay | Admitting: Cardiovascular Disease

## 2022-05-10 DIAGNOSIS — Z03818 Encounter for observation for suspected exposure to other biological agents ruled out: Secondary | ICD-10-CM | POA: Diagnosis not present

## 2022-05-10 DIAGNOSIS — R197 Diarrhea, unspecified: Secondary | ICD-10-CM | POA: Diagnosis not present

## 2022-05-10 DIAGNOSIS — R051 Acute cough: Secondary | ICD-10-CM | POA: Diagnosis not present

## 2022-05-10 DIAGNOSIS — J029 Acute pharyngitis, unspecified: Secondary | ICD-10-CM | POA: Diagnosis not present

## 2022-05-10 NOTE — Telephone Encounter (Signed)
Prescription refill request for Eliquis received. Indication: Atrial flutter Last office visit: 11/22/21  P Nahser MD Scr: 0.62 on 04/27/22 Age:  76 Weight: 78.6kg  Based on above findings Eliquis '5mg'$  twice daily is the appropriate dose.  Refill approved.

## 2022-05-13 ENCOUNTER — Other Ambulatory Visit: Payer: Self-pay

## 2022-05-13 ENCOUNTER — Emergency Department (HOSPITAL_BASED_OUTPATIENT_CLINIC_OR_DEPARTMENT_OTHER): Payer: Medicare HMO

## 2022-05-13 ENCOUNTER — Emergency Department (HOSPITAL_BASED_OUTPATIENT_CLINIC_OR_DEPARTMENT_OTHER)
Admission: EM | Admit: 2022-05-13 | Discharge: 2022-05-13 | Disposition: A | Discharge status: Home / Self Care | Payer: Medicare HMO | Attending: Emergency Medicine | Admitting: Emergency Medicine

## 2022-05-13 ENCOUNTER — Encounter (HOSPITAL_BASED_OUTPATIENT_CLINIC_OR_DEPARTMENT_OTHER): Payer: Self-pay | Admitting: Emergency Medicine

## 2022-05-13 DIAGNOSIS — Z91040 Latex allergy status: Secondary | ICD-10-CM | POA: Insufficient documentation

## 2022-05-13 DIAGNOSIS — R059 Cough, unspecified: Secondary | ICD-10-CM | POA: Diagnosis not present

## 2022-05-13 DIAGNOSIS — Z7901 Long term (current) use of anticoagulants: Secondary | ICD-10-CM | POA: Diagnosis not present

## 2022-05-13 DIAGNOSIS — H109 Unspecified conjunctivitis: Secondary | ICD-10-CM | POA: Diagnosis not present

## 2022-05-13 DIAGNOSIS — I10 Essential (primary) hypertension: Secondary | ICD-10-CM | POA: Insufficient documentation

## 2022-05-13 DIAGNOSIS — Z86011 Personal history of benign neoplasm of the brain: Secondary | ICD-10-CM | POA: Insufficient documentation

## 2022-05-13 DIAGNOSIS — R519 Headache, unspecified: Secondary | ICD-10-CM | POA: Diagnosis not present

## 2022-05-13 DIAGNOSIS — J069 Acute upper respiratory infection, unspecified: Secondary | ICD-10-CM | POA: Insufficient documentation

## 2022-05-13 DIAGNOSIS — H1032 Unspecified acute conjunctivitis, left eye: Secondary | ICD-10-CM | POA: Diagnosis not present

## 2022-05-13 DIAGNOSIS — Z20822 Contact with and (suspected) exposure to covid-19: Secondary | ICD-10-CM | POA: Insufficient documentation

## 2022-05-13 DIAGNOSIS — I2510 Atherosclerotic heart disease of native coronary artery without angina pectoris: Secondary | ICD-10-CM | POA: Diagnosis not present

## 2022-05-13 DIAGNOSIS — Z7984 Long term (current) use of oral hypoglycemic drugs: Secondary | ICD-10-CM | POA: Insufficient documentation

## 2022-05-13 LAB — CBC WITH DIFFERENTIAL/PLATELET
Abs Immature Granulocytes: 0.18 10*3/uL — ABNORMAL HIGH (ref 0.00–0.07)
Basophils Absolute: 0 10*3/uL (ref 0.0–0.1)
Basophils Relative: 1 %
Eosinophils Absolute: 0 10*3/uL (ref 0.0–0.5)
Eosinophils Relative: 0 %
HCT: 32.3 % — ABNORMAL LOW (ref 36.0–46.0)
Hemoglobin: 10.5 g/dL — ABNORMAL LOW (ref 12.0–15.0)
Immature Granulocytes: 5 %
Lymphocytes Relative: 35 %
Lymphs Abs: 1.4 10*3/uL (ref 0.7–4.0)
MCH: 28.9 pg (ref 26.0–34.0)
MCHC: 32.5 g/dL (ref 30.0–36.0)
MCV: 89 fL (ref 80.0–100.0)
Monocytes Absolute: 1 10*3/uL (ref 0.1–1.0)
Monocytes Relative: 26 %
Neutro Abs: 1.3 10*3/uL — ABNORMAL LOW (ref 1.7–7.7)
Neutrophils Relative %: 33 %
Platelets: 160 10*3/uL (ref 150–400)
RBC: 3.63 MIL/uL — ABNORMAL LOW (ref 3.87–5.11)
RDW: 16.2 % — ABNORMAL HIGH (ref 11.5–15.5)
WBC: 4 10*3/uL (ref 4.0–10.5)
nRBC: 0 % (ref 0.0–0.2)

## 2022-05-13 LAB — COMPREHENSIVE METABOLIC PANEL
ALT: 29 U/L (ref 0–44)
AST: 37 U/L (ref 15–41)
Albumin: 2.7 g/dL — ABNORMAL LOW (ref 3.5–5.0)
Alkaline Phosphatase: 66 U/L (ref 38–126)
Anion gap: 9 (ref 5–15)
BUN: 12 mg/dL (ref 8–23)
CO2: 27 mmol/L (ref 22–32)
Calcium: 9 mg/dL (ref 8.9–10.3)
Chloride: 99 mmol/L (ref 98–111)
Creatinine, Ser: 0.62 mg/dL (ref 0.44–1.00)
GFR, Estimated: >60 mL/min (ref >60)
Glucose, Bld: 201 mg/dL — ABNORMAL HIGH (ref 70–99)
Potassium: 3.8 mmol/L (ref 3.5–5.1)
Sodium: 135 mmol/L (ref 135–145)
Total Bilirubin: 0.8 mg/dL (ref 0.3–1.2)
Total Protein: 7.5 g/dL (ref 6.5–8.1)

## 2022-05-13 LAB — SARS CORONAVIRUS 2 BY RT PCR: SARS Coronavirus 2 by RT PCR: NEGATIVE

## 2022-05-13 LAB — LACTIC ACID, PLASMA: Lactic Acid, Venous: 1.5 mmol/L (ref 0.5–1.9)

## 2022-05-13 MED ORDER — FLUORESCEIN SODIUM 1 MG OP STRP
1.0000 | ORAL_STRIP | Freq: Once | OPHTHALMIC | Status: AC
  Administered 2022-05-13: 1 via OPHTHALMIC
  Filled 2022-05-13: qty 1

## 2022-05-13 MED ORDER — PROCHLORPERAZINE EDISYLATE 10 MG/2ML IJ SOLN
5.0000 mg | Freq: Once | INTRAMUSCULAR | Status: AC
  Administered 2022-05-13: 5 mg via INTRAVENOUS
  Filled 2022-05-13: qty 2

## 2022-05-13 MED ORDER — CIPROFLOXACIN HCL 0.3 % OP SOLN
2.0000 [drp] | OPHTHALMIC | Status: DC
  Administered 2022-05-13: 2 [drp] via OPHTHALMIC
  Filled 2022-05-13: qty 2.5

## 2022-05-13 MED ORDER — KETOROLAC TROMETHAMINE 15 MG/ML IJ SOLN
15.0000 mg | Freq: Once | INTRAMUSCULAR | Status: AC
  Administered 2022-05-13: 15 mg via INTRAVENOUS
  Filled 2022-05-13: qty 1

## 2022-05-13 MED ORDER — TETRACAINE HCL 0.5 % OP SOLN
2.0000 [drp] | Freq: Once | OPHTHALMIC | Status: AC
  Administered 2022-05-13: 2 [drp] via OPHTHALMIC
  Filled 2022-05-13: qty 4

## 2022-05-13 NOTE — ED Provider Notes (Signed)
[place] EMERGENCY DEPARTMENT Provider Note   CSN: [id number] Arrival date & time: 05/13/22  1403     History  Chief Complaint  Patient presents with   Cough   Headache    Kristen Murray is a 76 y.o. female.   Cough Associated symptoms: headaches   Headache Associated symptoms: cough   Patient presents with cough.  Has had for the last few days.  Had been around someone else that had been coughing some URI symptoms.  No real sputum production but has had nasal drainage.  Also states I painful.  States she has redness and dryness recurrent conjunctivitis.  Hermann for this.  No fevers.  Has a headache.  Left-sided head.  Tends to get headaches but this 1 is more severe.  She is on blood thinners.  Reviewing notes appears to have a low-grade myelodysplasia.  No trauma.  No bleeding.  Reportedly had been seen at PCP and had negative RSV flu COVID and strep testing.    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 Authorizing Provider  acetaminophen (TYLENOL) 325 MG tablet Take 325-650 mg by mouth every 6  (six) hours as needed (for pain).    [provider]  ALPRAZolam Duanne Moron) 0.25 MG tablet Take 0.25 mg by mouth 2 (two) times daily as needed. 04/19/21   [provider]  b complex vitamins capsule daily.    [provider]  Cholecalciferol 25 MCG (1000 UT) capsule Take 1,000 Units by mouth daily.    [provider]  clotrimazole-betamethasone (LOTRISONE) cream Apply 1 application topically 2 (two) times daily. Use for 2 weeks. 06/06/21   Nunzio Cobbs, MD  cyclobenzaprine (FLEXERIL) 10 MG tablet Take 2.5 mg by mouth daily as needed for muscle spasms.    [provider]  diclofenac (VOLTAREN) 0.1 % ophthalmic solution 4 (four) times daily as needed.    [provider]  ELIQUIS 5 MG TABS tablet TAKE 1 TABLET TWICE DAILY 05/10/22   Nahser, Wonda Cheng, MD  EPINEPHrine 0.3 mg/0.3 mL IJ SOAJ injection Inject 0.3 mg into the muscle as needed for anaphylaxis. Patient not taking: Reported on 04/27/2022 10/03/21   Althea Charon, FNP  fluorometholone (FML) 0.1 % ophthalmic suspension Place 1 drop into both eyes every Monday, Wednesday, and Friday.     [provider]  fluticasone (FLONASE) 50 MCG/ACT nasal spray Place 1 spray into both nostrils 2 (two) times daily. 12/22/21  Kozlow, Donnamarie Poag, MD  folic acid (FOLVITE) 712 MCG tablet daily.    [provider]  glipiZIDE (GLUCOTROL XL) 5 MG 24 hr tablet Take 5 mg by mouth daily. 03/18/21   [provider]  glucose blood (FREESTYLE LITE) test strip CHECK BLOOD SUGAR ONCE DAILY AS DIRECTED.  DX:790.29 01/09/14   Hendricks Limes, MD  glucose blood test strip Inject 1 strip into the skin daily.    [provider]  ipratropium (ATROVENT) 0.03 % nasal spray 2 sprays in each nostril every 6 hours as directed 12/30/21   Kozlow, Donnamarie Poag, MD  Lancets (FREESTYLE) lancets Check blood sugar once daily as directed DX:790.29 (FREESTYLE FREEDOM LITE) 01/09/14   Hendricks Limes, MD   lidocaine (XYLOCAINE) 5 % ointment Apply 1 application topically 3 (three) times daily as needed. 06/06/21   Nunzio Cobbs, MD  loratadine (CLARITIN) 10 MG tablet Take 1 tablet (10 mg total) by mouth daily. 01/10/20   Isla Pence, MD  metoprolol tartrate (LOPRESSOR) 50 MG tablet Take 1.5 tablets (75 mg total) by mouth 2 (two) times daily. 12/20/21   Nahser, Wonda Cheng, MD  montelukast (SINGULAIR) 10 MG tablet Take 1 tablet (10 mg total) by mouth at bedtime. 12/22/21   Kozlow, Donnamarie Poag, MD  Multiple Vitamin (MULTIVITAMIN) tablet Take 1 tablet by mouth daily. Shaklee brand    [provider]  nitroGLYCERIN (NITROSTAT) 0.4 MG SL tablet Place 1 tablet (0.4 mg total) under the tongue every 5 (five) minutes as needed for chest pain. Patient not taking: Reported on 04/27/2022 01/11/21   Richardson Dopp T, PA-C  pantoprazole (PROTONIX) 40 MG tablet Take 40 mg by mouth 2 (two) times daily. 01/23/20   [provider]  rosuvastatin (CRESTOR) 5 MG tablet Take 5 mg by mouth See admin instructions. Take 5 mg by mouth at bedtime on Mondays and Thursdays..Currently on hold 11/20/19   [provider]  tretinoin (RETIN-A) 0.025 % cream Apply 1 application. topically at bedtime. 12/14/21   [provider]  trolamine salicylate (ASPERCREME) 10 % cream Apply 1 application topically as needed for muscle pain.    [provider]  valACYclovir (VALTREX) 500 MG tablet Take 1 tablet (500 mg total) by mouth 2 (two) times daily. Take twice a day for thee days as needed for an outbreak. 01/04/22   Nunzio Cobbs, MD  verapamil (CALAN-SR) 180 MG CR tablet Take 180 mg by mouth at bedtime.  01/02/17   [provider]      Allergies    Codeine, Macrodantin [nitrofurantoin macrocrystal], Other, Propoxyphene, Propoxyphene hcl, Dulaglutide, Latex, Metformin hcl, Neosporin [bacitracin-polymyxin b], Zoster vac recomb adjuvanted, Avocado, Banana, Ciprofloxacin, Tizanidine, and  Tramadol    Review of Systems   Review of Systems  Respiratory:  Positive for cough.   Neurological:  Positive for headaches.    Physical Exam Updated Vital Signs BP 133/65   Pulse 86   Temp 99.4 F (37.4 C) (Oral)   Resp 16   Ht '5\' 5"'$  (1.651 m)   Wt 77.1 kg   LMP 07/30/1990   SpO2 99%   BMI 28.29 kg/m  Physical Exam Vitals and nursing note reviewed.  HENT:     Mouth/Throat:     Comments: Mild posterior pharyngeal erythema without exudate. Eyes:     Extraocular Movements: Extraocular movements intact.     Comments: Conjunctival injection on left side.  Pupil reactive with mild photophobia on left.  Eye movements  intact.  Pulmonary:     Breath sounds: Normal breath sounds.  Abdominal:     Tenderness: There is no abdominal tenderness.  Skin:    General: Skin is warm.  Neurological:     Mental Status: She is alert.     ED Results / Procedures / Treatments   Labs (all labs ordered are listed, but only abnormal results are displayed) Labs Reviewed  COMPREHENSIVE METABOLIC PANEL - Abnormal; Notable for the following components:      Result Value   Glucose, Bld 201 (*)    Albumin 2.7 (*)    All other components within normal limits  CBC WITH DIFFERENTIAL/PLATELET - Abnormal; Notable for the following components:   RBC 3.63 (*)    Hemoglobin 10.5 (*)    HCT 32.3 (*)    RDW 16.2 (*)    Neutro Abs 1.3 (*)    Abs Immature Granulocytes 0.18 (*)    All other components within normal limits  SARS CORONAVIRUS 2 BY RT PCR  LACTIC ACID, PLASMA    EKG None  Radiology CT Head Wo Contrast  Result Date: 05/13/2022 CLINICAL DATA:  Headache. EXAM: CT HEAD WITHOUT CONTRAST TECHNIQUE: Contiguous axial images were obtained from the base of the skull through the vertex without intravenous contrast. RADIATION DOSE REDUCTION: This exam was performed according to the departmental dose-optimization program which includes automated exposure control, adjustment of the mA and/or kV  according to patient size and/or use of iterative reconstruction technique. COMPARISON:  Head CT dated 12/01/2020. FINDINGS: Brain: Mild age-related atrophy and chronic microvascular ischemic changes. Probable 1 cm calcified meningioma along the right tentorium in the posterior fossa. There is no acute intracranial hemorrhage. No mass effect or midline shift. No extra-axial fluid collection. Vascular: No hyperdense vessel or unexpected calcification. Skull: Normal. Negative for fracture or focal lesion. Sinuses/Orbits: Diffuse mucoperiosteal thickening and opacification of paranasal sinuses. Bilateral maxillary sinus air-fluid level. The mastoid air cells are clear. Other: None IMPRESSION: 1. No acute intracranial pathology. 2. Mild age-related atrophy and chronic microvascular ischemic changes. 3. Paranasal sinus disease. Electronically Signed   By: Anner Crete M.D.   On: 05/13/2022 19:28   DG Chest 2 View  Result Date: 05/13/2022 CLINICAL DATA:  Cough EXAM: CHEST - 2 VIEW COMPARISON:  01/26/2020 FINDINGS: The heart size and mediastinal contours are within normal limits. No focal airspace consolidation, pleural effusion, or pneumothorax. The visualized skeletal structures are unremarkable. IMPRESSION: No active cardiopulmonary disease. Electronically Signed   By: Davina Poke D.O.   On: 05/13/2022 15:33    Procedures Procedures    Medications Ordered in ED Medications  ciprofloxacin (CILOXAN) 0.3 % ophthalmic solution 2 drop (2 drops Left Eye Given 05/13/22 2018)  tetracaine (PONTOCAINE) 0.5 % ophthalmic solution 2 drop (2 drops Left Eye Given 05/13/22 1914)  fluorescein ophthalmic strip 1 strip (1 strip Left Eye Given 05/13/22 1914)  ketorolac (TORADOL) 15 MG/ML injection 15 mg (15 mg Intravenous Given 05/13/22 2033)  prochlorperazine (COMPAZINE) injection 5 mg (5 mg Intravenous Given 05/13/22 2034)    ED Course/ Medical Decision Making/ A&P                           Medical Decision  Making Amount and/or Complexity of Data Reviewed Labs: ordered. Radiology: ordered.  Risk Prescription drug management.   Patient was headache.  Sore throat.  URI type symptoms.  Had for the last few days.  Does have some conjunctival injection on  the left side.  Reported drainage.  Recurrent conjunctivitis.  However with headache and left-sided eye pain thought of other causes such as glaucoma, cluster headaches are considered.  States she has been on a calcium channel blocker to help.  Had less headaches because of that.  Will get head CT due to headache on a blood thinner that is worse than normal.  Will check intraocular pressure on the eye as well as a fluorescein stain.  We will give oxygen to help evaluate for a cluster headache.  Patient did feel somewhat better with oxygen.  Potentially could be some involvement of a cluster headache.  CT scan done and reassuring.  May be some sinus disease.  Do not think we need antibiotics for it at this time.  However does have some drainage in the left eye.  Worry of a conjunctivitis.  We will treat with antibiotics.  Unable to check intraocular pressure due to malfunctioning Tono-Pen.  Also white count is reassuring.  Meningitis felt less likely.  Feels better after Toradol and some Compazine.  Will discharge home with outpatient follow-up.  Likely has a virus of some kind       Final Clinical Impression(s) / ED Diagnoses Final diagnoses:  Upper respiratory tract infection, unspecified type  Acute nonintractable headache, unspecified headache type  Conjunctivitis of left eye, unspecified conjunctivitis type    Rx / DC Orders ED Discharge Orders     None         Davonna Belling, MD 05/13/22 2239

## 2022-05-13 NOTE — ED Notes (Signed)
ED Provider at bedside. 

## 2022-05-13 NOTE — Discharge Instructions (Signed)
Follow-up with an eye doctor in the next couple days.  Return for worsening vision.

## 2022-05-13 NOTE — ED Triage Notes (Signed)
Pt arrives pov, to triage in wheelchair, c/o sore throat, HA, cough and left eye swelling and drainage. Also reports bilateral neck pain and tenderness. AOx4, bilaterally equal, VAN neg

## 2022-05-15 DIAGNOSIS — J069 Acute upper respiratory infection, unspecified: Secondary | ICD-10-CM | POA: Diagnosis not present

## 2022-05-22 ENCOUNTER — Other Ambulatory Visit: Payer: Self-pay | Admitting: Allergy and Immunology

## 2022-05-23 DIAGNOSIS — B301 Conjunctivitis due to adenovirus: Secondary | ICD-10-CM | POA: Diagnosis not present

## 2022-05-23 DIAGNOSIS — H0100A Unspecified blepharitis right eye, upper and lower eyelids: Secondary | ICD-10-CM | POA: Diagnosis not present

## 2022-05-23 DIAGNOSIS — H269 Unspecified cataract: Secondary | ICD-10-CM | POA: Diagnosis not present

## 2022-05-23 DIAGNOSIS — H0100B Unspecified blepharitis left eye, upper and lower eyelids: Secondary | ICD-10-CM | POA: Diagnosis not present

## 2022-05-23 DIAGNOSIS — H11232 Symblepharon, left eye: Secondary | ICD-10-CM | POA: Diagnosis not present

## 2022-05-23 DIAGNOSIS — H04123 Dry eye syndrome of bilateral lacrimal glands: Secondary | ICD-10-CM | POA: Diagnosis not present

## 2022-05-25 DIAGNOSIS — M25512 Pain in left shoulder: Secondary | ICD-10-CM | POA: Diagnosis not present

## 2022-05-29 DIAGNOSIS — J3089 Other allergic rhinitis: Secondary | ICD-10-CM | POA: Insufficient documentation

## 2022-05-29 DIAGNOSIS — M25512 Pain in left shoulder: Secondary | ICD-10-CM | POA: Diagnosis not present

## 2022-05-29 DIAGNOSIS — M47812 Spondylosis without myelopathy or radiculopathy, cervical region: Secondary | ICD-10-CM | POA: Insufficient documentation

## 2022-05-29 DIAGNOSIS — E7800 Pure hypercholesterolemia, unspecified: Secondary | ICD-10-CM | POA: Insufficient documentation

## 2022-05-29 DIAGNOSIS — E1165 Type 2 diabetes mellitus with hyperglycemia: Secondary | ICD-10-CM | POA: Insufficient documentation

## 2022-05-29 DIAGNOSIS — J301 Allergic rhinitis due to pollen: Secondary | ICD-10-CM | POA: Insufficient documentation

## 2022-05-29 DIAGNOSIS — H10429 Simple chronic conjunctivitis, unspecified eye: Secondary | ICD-10-CM | POA: Insufficient documentation

## 2022-05-29 DIAGNOSIS — E041 Nontoxic single thyroid nodule: Secondary | ICD-10-CM | POA: Insufficient documentation

## 2022-05-29 DIAGNOSIS — M316 Other giant cell arteritis: Secondary | ICD-10-CM | POA: Insufficient documentation

## 2022-05-30 ENCOUNTER — Ambulatory Visit: Payer: Medicare HMO | Admitting: Allergy and Immunology

## 2022-05-30 VITALS — BP 122/68 | HR 69 | Temp 97.7°F | Ht 64.0 in | Wt 168.0 lb

## 2022-05-30 DIAGNOSIS — T7805XD Anaphylactic reaction due to tree nuts and seeds, subsequent encounter: Secondary | ICD-10-CM

## 2022-05-30 DIAGNOSIS — J988 Other specified respiratory disorders: Secondary | ICD-10-CM

## 2022-05-30 DIAGNOSIS — B9789 Other viral agents as the cause of diseases classified elsewhere: Secondary | ICD-10-CM

## 2022-05-30 DIAGNOSIS — K219 Gastro-esophageal reflux disease without esophagitis: Secondary | ICD-10-CM | POA: Diagnosis not present

## 2022-05-30 DIAGNOSIS — J3089 Other allergic rhinitis: Secondary | ICD-10-CM | POA: Diagnosis not present

## 2022-05-30 DIAGNOSIS — J0140 Acute pansinusitis, unspecified: Secondary | ICD-10-CM

## 2022-05-30 MED ORDER — CETIRIZINE HCL 10 MG PO TABS: 10.0000 mg | ORAL_TABLET | Freq: Every day | ORAL | 4 refills | Status: DC | PRN

## 2022-05-30 MED ORDER — FLUTICASONE PROPIONATE 50 MCG/ACT NA SUSP: 1.0000 | Freq: Two times a day (BID) | NASAL | 4 refills | Status: DC

## 2022-05-30 MED ORDER — PANTOPRAZOLE SODIUM 40 MG PO TBEC: 40.0000 mg | DELAYED_RELEASE_TABLET | Freq: Two times a day (BID) | ORAL | 4 refills | Status: DC

## 2022-05-30 MED ORDER — DM-GUAIFENESIN ER 30-600 MG PO TB12: 1.0000 | ORAL_TABLET | Freq: Two times a day (BID) | ORAL | 3 refills | Status: DC

## 2022-05-30 MED ORDER — METHYLPREDNISOLONE ACETATE 80 MG/ML IJ SUSP
80.0000 mg | Freq: Once | INTRAMUSCULAR | Status: AC
  Administered 2022-06-05: 80 mg via INTRAMUSCULAR

## 2022-05-30 MED ORDER — IPRATROPIUM BROMIDE 0.06 % NA SOLN: 2.0000 | Freq: Three times a day (TID) | NASAL | 11 refills | Status: DC

## 2022-05-30 MED ORDER — MONTELUKAST SODIUM 10 MG PO TABS: 10.0000 mg | ORAL_TABLET | Freq: Every day | ORAL | 4 refills | Status: DC

## 2022-05-30 NOTE — Patient Instructions (Signed)
  1. Continue avoidance - tree nuts, pine nuts, banana, avocado  2. Continue to Treat and prevent inflammation:   A. Flonase 1-2 sprays each nostril one time per day  B. montelukast 10 mg tablet once a day  3. Continue to Treat LPR:   A. Continue to consolidate all caffeine and chocolate use  B. Continue to replace throat clearing with swallowing maneuver  4. If needed:   A. nasal saline  B. OTC antihistamine - Cetirizine (Zyrtec)  C. Epi-Pen  D. Can add OTC Mucinex two times per day  E.  Ipratropium 0.06% 1-2 sprays each nostril every 6 hours to dry nose  5. For this recent episode:   A. Depomedrol 80 mg IM delivered in clinic  B. Use pantoprazole 40 mg - 1 tablet 2 times per day  C. OTC Mucinex DM - 1 tablet 2 times per day  D. Further treatment???  6. Obtain fall flu vaccine and RSV vaccine  7. Return to clinic in 1 year or earlier if problem

## 2022-05-30 NOTE — Progress Notes (Signed)
[place]   Follow-up Note  Referring Provider: [name], MD Primary Provider: [name], MD Date of Office Visit: 05/30/2022  Subjective:   Kristen Murray (DOB: 1946-09-02) is a 76 y.o. female who returns to the [facility] on 05/30/2022 in re-evaluation of the following:  HPI: Kristen Murray presents to this clinic in evaluation of allergic rhinitis, LPR, and food allergy.  Her last visit to this clinic was with our nurse practitioner on 03 October 2021.  I last saw her in this clinic on 24 May 2021.  For the most part she has done very well with her upper airway disease and her reflux disease while utilizing some nasal steroids and montelukast and being very careful about her reflux with minimizing caffeine and chocolate and minimizing throat clearing.  Unfortunately, on 08 May 2022 she developed acute onset of sore throat with the glands swollen up in her neck and coughing and headache and swallowing difficulties for which she went to the urgent care center and was evaluated with a CT scan of her sinuses and a chest x-ray which apparently were normal and she apparently had negative strep test and negative viral swabs and she was not treated with any therapy.  Currently, she complains of having soreness behind her ears and some headache and still has coughing and has lots of throat clearing and lots of postnasal drip and is sleeping on several pillows to get to sleep.  She had longstanding anosmia of several years.  She has not had any ugly nasal discharge or ugly sputum production or chest pain.  She does not think her reflux is particularly active.  Allergies as of 05/30/2022       Reactions   Codeine Hives, Other (See Comments)   Because of a history of documented adverse serious drug reaction, Medi Alert bracelet  is recommended   Macrodantin [nitrofurantoin Macrocrystal] Other (See Comments)   Numbness, aching all over    Other Shortness Of Breath, Rash, Other (See Comments)   Pine nuts & walnuts throat congestion. No documented angioedema.   Propoxyphene Hives, Swelling, Rash, Other (See Comments)   Propoxyphene Hcl Hives, Swelling, Rash   Dulaglutide Other (See Comments)   Other reaction(s): weak, ha, no appetite   Latex Rash, Other (See Comments)   Metformin Hcl Other (See Comments)   Other reaction(s): leg discomfort   Neosporin [bacitracin-polymyxin B] Other (See Comments)   Blisters   Zoster Vac Recomb Adjuvanted Rash, Other (See Comments)   Other reaction(s): confusion   Avocado Rash   Banana Rash   Ciprofloxacin Nausea Only, Other (See Comments)   Nausea (Note: Patient takes this when on mission trips, however)   Tizanidine Other (See Comments)   Reaction not recalled   Tramadol Nausea Only        Medication List    acetaminophen 325 MG tablet Commonly known as: TYLENOL Take 325-650 mg by mouth every 6 (six) hours as needed (for pain).   ALPRAZolam 0.25 MG tablet Commonly known as: XANAX Take 0.25 mg by mouth 2 (two) times daily as needed.   azelastine 0.1 % nasal spray Commonly known as: ASTELIN Place into both nostrils 2 (two) times daily. Use in each nostril as directed   b complex vitamins capsule daily.   Cholecalciferol 25 MCG (1000 UT) capsule Take 1,000 Units by mouth daily.   clotrimazole-betamethasone cream Commonly known as: Lotrisone Apply 1 application topically 2 (two) times  daily. Use for 2 weeks.   cyclobenzaprine 10 MG tablet Commonly known as: FLEXERIL Take 2.5 mg by mouth daily as needed for muscle spasms.   diclofenac 0.1 % ophthalmic solution Commonly known as: VOLTAREN 4 (four) times daily as needed.   Eliquis 5 MG Tabs tablet Generic drug: apixaban TAKE 1 TABLET TWICE DAILY   EPINEPHrine 0.3 mg/0.3 mL Soaj injection Commonly known as: EPI-PEN Inject 0.3 mg into the muscle as needed for anaphylaxis.   fluorometholone 0.1 % ophthalmic  suspension Commonly known as: FML Place 1 drop into both eyes every Monday, Wednesday, and Friday.   fluticasone 50 MCG/ACT nasal spray Commonly known as: FLONASE Place 1 spray into both nostrils 2 (two) times daily.   folic acid 716 MCG tablet Commonly known as: FOLVITE daily.   freestyle lancets Check blood sugar once daily as directed DX:790.29 (FREESTYLE FREEDOM LITE)   glipiZIDE 5 MG 24 hr tablet Commonly known as: GLUCOTROL XL Take 5 mg by mouth daily.   glucose blood test strip Inject 1 strip into the skin daily.   glucose blood test strip Commonly known as: FREESTYLE LITE CHECK BLOOD SUGAR ONCE DAILY AS DIRECTED.  DX:790.29   ipratropium 0.03 % nasal spray Commonly known as: ATROVENT 2 sprays in each nostril every 6 hours as directed   lidocaine 5 % ointment Commonly known as: XYLOCAINE Apply 1 application topically 3 (three) times daily as needed.   loratadine 10 MG tablet Commonly known as: CLARITIN Take 1 tablet (10 mg total) by mouth daily.   metoprolol tartrate 50 MG tablet Commonly known as: LOPRESSOR Take 1.5 tablets (75 mg total) by mouth 2 (two) times daily.   montelukast 10 MG tablet Commonly known as: SINGULAIR Take 1 tablet (10 mg total) by mouth at bedtime.   multivitamin tablet Take 1 tablet by mouth daily. Shaklee brand   nitroGLYCERIN 0.4 MG SL tablet Commonly known as: NITROSTAT Place 1 tablet (0.4 mg total) under the tongue every 5 (five) minutes as needed for chest pain.   pantoprazole 40 MG tablet Commonly known as: PROTONIX Take 40 mg by mouth 2 (two) times daily.   rosuvastatin 5 MG tablet Commonly known as: CRESTOR Take 5 mg by mouth See admin instructions. Take 5 mg by mouth at bedtime on Mondays and Thursdays..Currently on hold   tretinoin 0.025 % cream Commonly known as: RETIN-A Apply 1 application. topically at bedtime.   trolamine salicylate 10 % cream Commonly known as: ASPERCREME Apply 1 application topically as  needed for muscle pain.   valACYclovir 500 MG tablet Commonly known as: VALTREX Take 1 tablet (500 mg total) by mouth 2 (two) times daily. Take twice a day for thee days as needed for an outbreak.   verapamil 180 MG CR tablet Commonly known as: CALAN-SR Take 180 mg by mouth at bedtime.    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   Nephrolithiasis    Dr Amalia Hailey, [facility], Hx of [3][   Polyp of colon    Radiculopathy     Past Surgical History:  Procedure Laterality Date   ABDOMINAL HYSTERECTOMY     TAH/BSO --fibroids, no cancer   BALLOON DILATION N/A 07/20/2014   Procedure: BALLOON DILATION;  Surgeon: Garlan Fair, MD;  Location: [facility];  Service: Endoscopy;  Laterality: N/A;   BREAST BIOPSY     Right   CARPAL TUNNEL RELEASE     & trigger thumb RUE; Dr Alphonzo Cruise   COLONOSCOPY W/ POLYPECTOMY  07/2004   Neg 2011; Dr Earle Gell   CYSTOSCOPY  11/2009   Neg   ESOPHAGOGASTRODUODENOSCOPY N/A 07/20/2014   Procedure: ESOPHAGOGASTRODUODENOSCOPY (EGD);  Surgeon: Garlan Fair, MD;  Location: [facility];  Service: Endoscopy;  Laterality: N/A;   FLEXIBLE BRONCHOSCOPY W/ UPPER ENDOSCOPY     neg   KNEE ARTHROSCOPY     Left   ROTATOR CUFF REPAIR     R shoulder   TONSILLECTOMY     TRIGGER FINGER  RELEASE Right    Right Thumb    Review of systems negative except as noted in HPI / PMHx or noted below:  Review of Systems  Constitutional: Negative.   HENT: Negative.    Eyes: Negative.   Respiratory: Negative.    Cardiovascular: Negative.   Gastrointestinal: Negative.   Genitourinary: Negative.   Musculoskeletal: Negative.   Skin: Negative.   Neurological: Negative.   Endo/Heme/Allergies: Negative.   Psychiatric/Behavioral: Negative.       Objective:   Vitals:   05/30/22 1031  BP: 122/68  Pulse: 69  Temp: 97.7 F (36.5 C)  SpO2: 98%   Height: '5\' 4"'$  (162.6 cm)  Weight: 168 lb (76.2 kg)   Physical Exam Constitutional:      Appearance: She is not diaphoretic.     Comments: Throat clearing  HENT:     Head: Normocephalic.     Right Ear: Tympanic membrane, ear canal and external ear normal.     Left Ear: Tympanic membrane, ear canal and external ear normal.     Nose: Nose normal. No mucosal edema or rhinorrhea.     Mouth/Throat:     Pharynx: Uvula midline. No oropharyngeal exudate.  Eyes:     Conjunctiva/sclera: Conjunctivae normal.  Neck:     Thyroid: No thyromegaly.     Trachea: Trachea normal. No tracheal tenderness or tracheal deviation.  Cardiovascular:     Rate and Rhythm: Normal rate and regular rhythm.     Heart sounds: Normal heart sounds, S1 normal and S2 normal. No murmur heard. Pulmonary:     Effort: No respiratory distress.     Breath sounds: Normal breath sounds. No stridor. No wheezing or rales.  Lymphadenopathy:     Head:     Right side of head: No tonsillar adenopathy.     Left side of head: No tonsillar adenopathy.     Cervical: No cervical adenopathy.  Skin:    Findings: No erythema or rash.     Nails: There is no clubbing.  Neurological:     Mental Status: She is alert.     Diagnostics:   Results of a head CT scan obtained 13 May 2022 identifies the following:  Brain: Mild age-related atrophy and chronic microvascular  ischemic  changes. Probable 1 cm calcified meningioma along the right tentorium in the posterior fossa. There is no acute intracranial hemorrhage. No mass effect or midline shift. No extra-axial fluid collection.   Vascular: No hyperdense vessel or unexpected calcification.   Skull: Normal. Negative for fracture or focal lesion.   Sinuses/Orbits: Diffuse mucoperiosteal thickening and opacification of paranasal sinuses. Bilateral maxillary sinus air-fluid level. The mastoid air cells are clear.  Results of a chest x-ray obtained 13 May 2022 identified the following:  The heart size and mediastinal contours are within normal limits. No focal airspace consolidation, pleural effusion, or pneumothorax. The visualized skeletal structures are unremarkable.  Assessment and Plan:   1. Other allergic rhinitis   2. LPRD (laryngopharyngeal reflux disease)   3. 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 Viral respiratory illness   5. Acute non-recurrent pansinusitis     1. Continue avoidance - tree nuts, pine nuts, banana, avocado  2. Continue to Treat and prevent inflammation:   A. Flonase 1-2 sprays each nostril one time per day  B. montelukast 10 mg tablet once a day  3. Continue to Treat LPR:   A. Continue to consolidate all caffeine and chocolate use  B. Continue to replace throat clearing with swallowing maneuver  4. If needed:   A. nasal saline  B. OTC antihistamine - Cetirizine (Zyrtec)  C. Epi-Pen  D. Can add OTC Mucinex two times per day  E.  Ipratropium 0.06% 1-2 sprays each nostril every 6 hours to dry nose  5. For this recent episode:   A. Depomedrol 80 mg IM delivered in clinic  B. Use pantoprazole 40 mg - 1 tablet 2 times per day  C. OTC Mucinex DM - 1 tablet 2 times per day  D. Further treatment???  6. Obtain fall flu vaccine and RSV vaccine  7. Return to clinic in 1 year or earlier if problem  Kristen Murray apparently had some type  of viral respiratory tract infection about 2 to 3 weeks ago with MRI proves of sinusitis and although she is somewhat improved she is not clearing out the inflammation that has been left behind by this event and we will now give her a systemic steroid and because of her previous history of having significant reflux induced respiratory disease will restart a proton pump inhibitor.  She can discontinue this proton pump inhibitor once she is better which hopefully will be over the course of the next 7 to 14 days.  We will hold off on any antibiotics at this point in time but certainly administer antibiotics should she not resolve this issue within the next 7 to 14 days.  She will keep in contact with me noting her response to this approach.  I will see her back in this clinic in 1 year or earlier if there is a problem.  Allena Katz, MD Allergy / Immunology [place]

## 2022-05-31 ENCOUNTER — Encounter: Payer: Self-pay | Admitting: Allergy and Immunology

## 2022-06-02 DIAGNOSIS — R399 Unspecified symptoms and signs involving the genitourinary system: Secondary | ICD-10-CM | POA: Diagnosis not present

## 2022-06-05 DIAGNOSIS — B9789 Other viral agents as the cause of diseases classified elsewhere: Secondary | ICD-10-CM | POA: Diagnosis not present

## 2022-06-05 DIAGNOSIS — J988 Other specified respiratory disorders: Secondary | ICD-10-CM | POA: Diagnosis not present

## 2022-06-05 DIAGNOSIS — J3089 Other allergic rhinitis: Secondary | ICD-10-CM | POA: Diagnosis not present

## 2022-06-05 DIAGNOSIS — K219 Gastro-esophageal reflux disease without esophagitis: Secondary | ICD-10-CM | POA: Diagnosis not present

## 2022-06-06 DIAGNOSIS — M19012 Primary osteoarthritis, left shoulder: Secondary | ICD-10-CM | POA: Diagnosis not present

## 2022-06-07 DIAGNOSIS — M25512 Pain in left shoulder: Secondary | ICD-10-CM | POA: Diagnosis not present

## 2022-06-10 DIAGNOSIS — G43009 Migraine without aura, not intractable, without status migrainosus: Secondary | ICD-10-CM | POA: Diagnosis not present

## 2022-06-10 DIAGNOSIS — I4891 Unspecified atrial fibrillation: Secondary | ICD-10-CM | POA: Diagnosis not present

## 2022-06-10 DIAGNOSIS — G4734 Idiopathic sleep related nonobstructive alveolar hypoventilation: Secondary | ICD-10-CM | POA: Diagnosis not present

## 2022-06-12 ENCOUNTER — Encounter: Payer: Self-pay | Admitting: Allergy and Immunology

## 2022-06-12 DIAGNOSIS — M25512 Pain in left shoulder: Secondary | ICD-10-CM | POA: Diagnosis not present

## 2022-06-14 DIAGNOSIS — M25512 Pain in left shoulder: Secondary | ICD-10-CM | POA: Diagnosis not present

## 2022-06-19 DIAGNOSIS — N763 Subacute and chronic vulvitis: Secondary | ICD-10-CM | POA: Diagnosis not present

## 2022-06-19 DIAGNOSIS — N898 Other specified noninflammatory disorders of vagina: Secondary | ICD-10-CM | POA: Diagnosis not present

## 2022-06-22 DIAGNOSIS — M25512 Pain in left shoulder: Secondary | ICD-10-CM | POA: Diagnosis not present

## 2022-06-27 DIAGNOSIS — M25512 Pain in left shoulder: Secondary | ICD-10-CM | POA: Diagnosis not present

## 2022-06-29 DIAGNOSIS — Z79899 Other long term (current) drug therapy: Secondary | ICD-10-CM | POA: Diagnosis not present

## 2022-06-29 DIAGNOSIS — R519 Headache, unspecified: Secondary | ICD-10-CM | POA: Diagnosis not present

## 2022-06-30 DIAGNOSIS — M25512 Pain in left shoulder: Secondary | ICD-10-CM | POA: Diagnosis not present

## 2022-07-05 DIAGNOSIS — M25512 Pain in left shoulder: Secondary | ICD-10-CM | POA: Diagnosis not present

## 2022-07-06 NOTE — Progress Notes (Signed)
GYNECOLOGY  VISIT   HPI: 76 y.o.   Married  [place] American  female   [id number] with Patient's last menstrual period was 07/30/1990.   here for vulvar biopsy.    Has chronic vulvitis.  She has used Lotrisone cream and Lidocaine ointment for her symptoms.  Symptoms are still there but are improved.   Uses Pads chronically.  Switched to a cotton pad.  Hx HSV.   She takes Eliquis.  Has low neutrophil counts.  Went to [place].  GYNECOLOGIC HISTORY: Patient's last menstrual period was 07/30/1990. 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   Perennial allergic rhinitis 05/29/2022   Pure hypercholesterolemia 05/29/2022   Simple chronic conjunctivitis 05/29/2022   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   Partial thickness rotator cuff tear 03/17/2022   Dysuria 02/28/2022   CAD (coronary artery disease)    Myelodysplasia, 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   Spinal stenosis of lumbar region 07/30/2020   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   Bilateral knee pain 05/11/2020   Atrial flutter ([place]) 01/29/2020   A-fib ([place]) 01/27/2020   Atrial flutter, paroxysmal ([place]) 01/26/2020   Lumbar spondylosis 10/27/2019   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   Iron deficiency anemia due to chronic blood loss 07/18/2019   Closed fracture of proximal phalanx of great toe 09/02/2018   Nuclear sclerosis of both eyes 08/20/2017   High risk  medication use 06/15/2017   Recurrent urinary tract infection 10/14/2015   Pain in the chest    Chest pain at rest 11/26/2014   Shingles 05/14/2014   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   Vaginal atrophy 10/16/2012   Menopause 10/16/2012   Chest pain, atypical 02/19/2012   Type 2 diabetes mellitus with neurological manifestations, 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   Migraine 01/09/2012   Interstitial cystitis (chronic) without hematuria 12/20/2011   Cervical stenosis of spinal canal 08/28/2011   Meningioma ([place]) 08/28/2011   COSTOCHONDRITIS 11/16/2010   DISTURBANCES OF SENSATION OF SMELL AND TASTE 04/05/2010   NEPHROLITHIASIS, HX OF 02/08/2010   EDEMA 10/01/2009   Brachial neuritis or radiculitis NOS 06/14/2009   EXTERNAL HEMORRHOIDS 05/25/2009   MUSCLE PAIN 05/17/2009   Hyperlipidemia 04/23/2009   ABNORMAL THYROID FUNCTION TESTS 04/23/2009   MENINGIOMA 12/26/2007   HEADACHE 12/26/2007   RADICULOPATHY 04/02/2007   Essential hypertension 11/21/2006   GERD 11/21/2006    Past Medical History:  Diagnosis Date   Arthritis    Borderline abnormal TFTs    as a teen   CAD (coronary artery disease)    Coronary CTA 3/22: Calcium score 1 (38th percentile), minimal nonobstructive CAD with 0-24% in mid RCA; aortic atherosclerosis   Chronic leukopenia  05/17/2020   Diabetes mellitus without complication ([place])    E. 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   Nephrolithiasis    Dr Amalia Hailey, [facility], Hx of [3][   Polyp of colon    Radiculopathy     Past Surgical History:  Procedure Laterality Date   ABDOMINAL HYSTERECTOMY     TAH/BSO --fibroids, no cancer   BALLOON  DILATION N/A 07/20/2014   Procedure: BALLOON DILATION;  Surgeon: Garlan Fair, MD;  Location: [facility];  Service: Endoscopy;  Laterality: N/A;   BREAST BIOPSY     Right   CARPAL TUNNEL RELEASE     & trigger thumb RUE; Dr Alphonzo Cruise   COLONOSCOPY W/ POLYPECTOMY  07/2004   Neg 2011; Dr Earle Gell   CYSTOSCOPY  11/2009   Neg   ESOPHAGOGASTRODUODENOSCOPY N/A 07/20/2014   Procedure: ESOPHAGOGASTRODUODENOSCOPY (EGD);  Surgeon: Garlan Fair, MD;  Location: [facility];  Service: Endoscopy;  Laterality: N/A;   FLEXIBLE BRONCHOSCOPY W/ UPPER ENDOSCOPY     neg   KNEE ARTHROSCOPY     Left   ROTATOR CUFF REPAIR     R shoulder   TONSILLECTOMY     TRIGGER FINGER RELEASE Right    Right Thumb    Current Outpatient Medications  Medication Sig Dispense Refill   acetaminophen (TYLENOL) 325 MG tablet Take 325-650 mg by mouth every 6 (six) hours as needed (for pain).     ALPRAZolam (XANAX) 0.25 MG tablet Take 0.25 mg by mouth 2 (two) times daily as needed.     azelastine (ASTELIN) 0.1 % nasal spray Place into both nostrils 2 (two) times daily. Use in each nostril as directed     b complex vitamins capsule daily.     cetirizine (ZYRTEC) 10 MG tablet Take 1 tablet (10 mg total) by mouth daily as needed for allergies (Can take an extra dose during flare ups.). 180 tablet 4   Cholecalciferol 25 MCG (1000 UT) capsule Take 1,000 Units by mouth daily.     clotrimazole-betamethasone (LOTRISONE) cream Apply 1 application topically 2 (two) times daily. Use for 2 weeks. 30 g 0   cyclobenzaprine (FLEXERIL) 10 MG tablet Take 2.5 mg by mouth daily as needed for muscle spasms.     dextromethorphan-guaiFENesin (MUCINEX DM) 30-600 MG 12hr tablet Take 1 tablet by mouth 2 (two) times daily. 60 tablet 3   diclofenac (VOLTAREN) 0.1 % ophthalmic solution 4 (four) times daily as needed.     ELIQUIS 5 MG TABS tablet TAKE 1 TABLET TWICE DAILY 180 tablet 1   EPINEPHrine 0.3 mg/0.3 mL IJ SOAJ injection Inject 0.3  mg into the muscle as needed for anaphylaxis. 1 each 1   fluorometholone (FML) 0.1 % ophthalmic suspension Place 1 drop into both eyes every Monday, Wednesday, and Friday.      fluticasone (FLONASE) 50 MCG/ACT nasal spray Place 1 spray into both nostrils 2 (two) times daily. 48 g 4   folic acid (FOLVITE) 948 MCG tablet daily.     glipiZIDE (GLUCOTROL XL) 10 MG 24 hr tablet Take 10 mg by mouth daily.  glucose blood (FREESTYLE LITE) test strip CHECK BLOOD SUGAR ONCE DAILY AS DIRECTED.  DX:790.29 100 each 12   glucose blood test strip Inject 1 strip into the skin daily.     ipratropium (ATROVENT) 0.06 % nasal spray Place 2 sprays into both nostrils 3 (three) times daily. 15 mL 11   Lancets (FREESTYLE) lancets Check blood sugar once daily as directed DX:790.29 (FREESTYLE FREEDOM LITE) 100 each 1   lidocaine (XYLOCAINE) 5 % ointment Apply 1 application topically 3 (three) times daily as needed. 1.25 g 0   loratadine (CLARITIN) 10 MG tablet Take 1 tablet (10 mg total) by mouth daily. 30 tablet 0   metoprolol tartrate (LOPRESSOR) 50 MG tablet Take 1.5 tablets (75 mg total) by mouth 2 (two) times daily. 270 tablet 3   montelukast (SINGULAIR) 10 MG tablet Take 1 tablet (10 mg total) by mouth at bedtime. 90 tablet 4   Multiple Vitamin (MULTIVITAMIN) tablet Take 1 tablet by mouth daily. Shaklee brand     nitroGLYCERIN (NITROSTAT) 0.4 MG SL tablet Place 1 tablet (0.4 mg total) under the tongue every 5 (five) minutes as needed for chest pain. 25 tablet 3   pantoprazole (PROTONIX) 40 MG tablet Take 1 tablet (40 mg total) by mouth 2 (two) times daily. 180 tablet 4   rosuvastatin (CRESTOR) 5 MG tablet Take 5 mg by mouth See admin instructions. Take 5 mg by mouth at bedtime on Mondays and Thursdays..Currently on hold     tretinoin (RETIN-A) 0.025 % cream Apply 1 application. topically at bedtime.     trolamine salicylate (ASPERCREME) 10 % cream Apply 1 application topically as needed for muscle pain.      valACYclovir (VALTREX) 500 MG tablet Take 1 tablet (500 mg total) by mouth 2 (two) times daily. Take twice a day for thee days as needed for an outbreak. 30 tablet 1   verapamil (CALAN-SR) 180 MG CR tablet Take 180 mg by mouth at bedtime.      No current facility-administered medications for this visit.   Facility-Administered Medications Ordered in Other Visits  Medication Dose Route Frequency Provider Last Rate Last Admin   filgrastim-sndz (ZARXIO) injection 300 mcg  300 mcg Subcutaneous Once Ennever, Rudell Cobb, MD         ALLERGIES: Codeine, Macrodantin [nitrofurantoin macrocrystal], Other, Propoxyphene, Propoxyphene hcl, Dulaglutide, Latex, Metformin hcl, Neosporin [bacitracin-polymyxin b], Zoster vac recomb adjuvanted, Avocado, Banana, Ciprofloxacin, Tizanidine, and Tramadol  Family History  Problem Relation Age of Onset   Alcohol abuse Mother    Other Father        unknown   Diabetes Maternal Aunt    Diabetes Maternal Uncle    Tuberculosis Maternal Uncle    Diabetes Maternal Grandmother    Coronary artery disease Neg Hx     Social History   Socioeconomic History   Marital status: Married    Spouse name: Not on file   Number of children: Not on file   Years of education: Not on file   Highest education level: Not on file  Occupational History   Occupation: Estate manager/land agent: [employer]  Tobacco Use   Smoking status: Never   Smokeless tobacco: Never  Vaping Use   Vaping Use: Never used  Substance and Sexual Activity   Alcohol use: No    Alcohol/week: 0.0 standard drinks of alcohol   Drug use: No   Sexual activity: Not Currently    Birth control/protection: Post-menopausal    Comment: 1st intercourse-  20  partners - 1  Other Topics Concern   Not on file  Social History Narrative   Regular Exercise-no         Social Determinants of Health   Financial Resource Strain: Not on file  Food Insecurity: Not on file  Transportation Needs: Not on  file  Physical Activity: Not on file  Stress: Not on file  Social Connections: Not on file  Intimate Partner Violence: Not on file    Review of Systems  All other systems reviewed and are negative.   PHYSICAL EXAMINATION:    BP 128/70   Pulse 64   Ht '5\' 4"'$  (1.626 m)   Wt 167 lb (75.8 kg)   LMP 07/30/1990   SpO2 98%   BMI 28.67 kg/m     General appearance: alert, cooperative and appears stated age   Pelvic: External genitalia:  greyish coloration to the skin of the vulva and perianal region, in the outline of a sanitary pad.               Urethra:  normal appearing urethra with no masses, tenderness or lesions       Vulvar biopsy  Consent done.  Sterile prep with betadine.  Local 1% lidocaine to right labia majora, lot [id number], exp 10/31/23.  3 mm punch biopsy done.  Tissue to pathology.  Silver nitrate used.  Essentially no bleeding.  No complications.   Chaperone was present for exam:  Estill Bamberg, CMA  ASSESSMENT  Chronic vulvitis.    PLAN  We discussed chronic vulvitis.  FU biopsy.  Try sensitive skin soap.  Will re-evaluate treatment options after biopsy is final.    An After Visit Summary was printed and given to the patient.

## 2022-07-07 ENCOUNTER — Encounter: Payer: Self-pay | Admitting: Obstetrics and Gynecology

## 2022-07-07 ENCOUNTER — Other Ambulatory Visit (HOSPITAL_COMMUNITY)
Admission: RE | Admit: 2022-07-07 | Discharge: 2022-07-07 | Disposition: A | Discharge status: Home / Self Care | Payer: Medicare HMO | Source: Ambulatory Visit | Attending: Obstetrics and Gynecology | Admitting: Obstetrics and Gynecology

## 2022-07-07 ENCOUNTER — Ambulatory Visit (INDEPENDENT_AMBULATORY_CARE_PROVIDER_SITE_OTHER): Payer: Medicare HMO | Admitting: Obstetrics and Gynecology

## 2022-07-07 DIAGNOSIS — M25512 Pain in left shoulder: Secondary | ICD-10-CM | POA: Diagnosis not present

## 2022-07-07 DIAGNOSIS — N763 Subacute and chronic vulvitis: Secondary | ICD-10-CM | POA: Insufficient documentation

## 2022-07-07 DIAGNOSIS — L308 Other specified dermatitis: Secondary | ICD-10-CM | POA: Diagnosis not present

## 2022-07-07 NOTE — Patient Instructions (Signed)
Vulva Biopsy, Care After The following information offers guidance on how to care for yourself after your procedure. Your health care provider may also give you more specific instructions. If you have problems or questions, contact your health care provider. What can I expect after the procedure? After the procedure, it is common to have: Slight bleeding from the biopsy site. Slight pain or discomfort at the biopsy site. Follow these instructions at home: Biopsy site care  Follow instructions from your health care provider about how to take care of your biopsy site. Make sure you: Clean the area using water and mild soap twice a day or as told by your health care provider. Gently pat the area dry. You may shower 24 hours after the procedure. If you were prescribed an antibiotic ointment, apply it as told by your health care provider. Do not stop using the antibiotic even if your condition improves. If told by your health care provider, take a sitz bath to help with pain and discomfort. This is a warm water bath that you take while sitting down. Do this as often as told by your health care provider. The water should only come up to your hips and cover your buttocks. You may pat the area dry with a soft, clean towel. Leave stitches (sutures), skin glue, or adhesive strips in place. These skin closures may need to stay in place for 2 weeks or longer. If adhesive strip edges start to loosen and curl up, you may trim the loose edges. Do not remove adhesive strips completely unless your health care provider tells you to do that. Check your biopsy site every day for signs of infection. It may be helpful to use a handheld mirror to do this. Check for: Redness, swelling, or more pain. More fluid or blood. Warmth. Pus or a bad smell. Do not rub the biopsy area after urinating. Gently pat the area dry or use a bottle filled with warm water (peri bottle) to clean the area. Gently wipe from front to  back. Lifestyle Wear loose, cotton underwear. Do not wear tight pants. For at least 1 week or until your health care provider approves: Do not use tampons, douche, or put anything inside your vagina. Do not have sex. Until your health care provider approves: Do not exercise, such as running or biking. Do not swim or use a hot tub. General instructions Take over-the-counter and prescription medicines only as told by your health care provider. Drink enough fluid to keep your urine pale yellow. Use a sanitary napkin until the bleeding stops. If told, put ice on the biopsy site. To do this: Place ice in a plastic bag. Place a towel between your skin and the bag. Leave the ice on for 20 minutes, 2-3 times a day. Remove the ice if your skin turns bright red. This is very important. If you cannot feel pain, heat, or cold, you have a greater risk of damage to the area. Keep all follow-up visits. This is important. Contact a health care provider if: You have redness, swelling, or more pain around your biopsy site. You have more fluid or blood coming from your biopsy site. Your biopsy site feels warm to the touch. Your pain is not controlled with medicine or ice packs. You have a fever or chills. Get help right away if: You have heavy bleeding from the vulva. You have pus or a bad smell coming from the biopsy site. You have abdominal pain. Summary After the procedure, it  is common to have slight bleeding and discomfort at the biopsy site. Follow instructions from your health care provider after your biopsy. Take sitz baths as told by your health care provider to help with pain and discomfort. Leave any sutures in place. Contact your health care provider if you notice any signs of infection around the biopsy site, including redness, swelling, more pain, more fluid or blood, or warmth. Keep all follow-up visits. This is important. This information is not intended to replace advice given to you  by your health care provider. Make sure you discuss any questions you have with your health care provider. Document Revised: 07/05/2021 Document Reviewed: 07/05/2021 Elsevier Patient Education  [place].

## 2022-07-12 LAB — SURGICAL PATHOLOGY

## 2022-07-14 ENCOUNTER — Other Ambulatory Visit: Payer: Self-pay | Admitting: *Deleted

## 2022-07-14 DIAGNOSIS — M25512 Pain in left shoulder: Secondary | ICD-10-CM | POA: Diagnosis not present

## 2022-07-14 MED ORDER — CLOTRIMAZOLE-BETAMETHASONE 1-0.05 % EX CREA: 1.0000 | TOPICAL_CREAM | Freq: Two times a day (BID) | CUTANEOUS | 0 refills | Status: DC

## 2022-07-17 DIAGNOSIS — M25512 Pain in left shoulder: Secondary | ICD-10-CM | POA: Diagnosis not present

## 2022-07-18 DIAGNOSIS — G4733 Obstructive sleep apnea (adult) (pediatric): Secondary | ICD-10-CM | POA: Diagnosis not present

## 2022-07-18 DIAGNOSIS — R400 Somnolence: Secondary | ICD-10-CM | POA: Diagnosis not present

## 2022-07-18 DIAGNOSIS — K219 Gastro-esophageal reflux disease without esophagitis: Secondary | ICD-10-CM | POA: Diagnosis not present

## 2022-07-18 DIAGNOSIS — I4892 Unspecified atrial flutter: Secondary | ICD-10-CM | POA: Diagnosis not present

## 2022-07-18 DIAGNOSIS — E1169 Type 2 diabetes mellitus with other specified complication: Secondary | ICD-10-CM | POA: Diagnosis not present

## 2022-07-18 DIAGNOSIS — E0869 Diabetes mellitus due to underlying condition with other specified complication: Secondary | ICD-10-CM | POA: Diagnosis not present

## 2022-07-19 DIAGNOSIS — M25512 Pain in left shoulder: Secondary | ICD-10-CM | POA: Diagnosis not present

## 2022-07-20 ENCOUNTER — Inpatient Hospital Stay: Payer: Medicare HMO

## 2022-07-20 ENCOUNTER — Inpatient Hospital Stay: Payer: Medicare HMO | Admitting: Hematology & Oncology

## 2022-07-20 ENCOUNTER — Inpatient Hospital Stay: Payer: Medicare HMO | Attending: Hematology & Oncology

## 2022-07-20 ENCOUNTER — Other Ambulatory Visit: Payer: Self-pay

## 2022-07-20 ENCOUNTER — Encounter: Payer: Self-pay | Admitting: Hematology & Oncology

## 2022-07-20 ENCOUNTER — Telehealth: Payer: Self-pay

## 2022-07-20 VITALS — BP 136/64 | HR 86 | Temp 98.1°F | Resp 16 | Ht 64.0 in | Wt 169.0 lb

## 2022-07-20 DIAGNOSIS — D469 Myelodysplastic syndrome, unspecified: Secondary | ICD-10-CM | POA: Insufficient documentation

## 2022-07-20 DIAGNOSIS — D46Z Other myelodysplastic syndromes: Secondary | ICD-10-CM

## 2022-07-20 DIAGNOSIS — D500 Iron deficiency anemia secondary to blood loss (chronic): Secondary | ICD-10-CM

## 2022-07-20 DIAGNOSIS — D72819 Decreased white blood cell count, unspecified: Secondary | ICD-10-CM

## 2022-07-20 LAB — CMP (CANCER CENTER ONLY)
ALT: 10 U/L (ref 0–44)
AST: 12 U/L — ABNORMAL LOW (ref 15–41)
Albumin: 4 g/dL (ref 3.5–5.0)
Alkaline Phosphatase: 78 U/L (ref 38–126)
Anion gap: 9 (ref 5–15)
BUN: 13 mg/dL (ref 8–23)
CO2: 27 mmol/L (ref 22–32)
Calcium: 10.3 mg/dL (ref 8.9–10.3)
Chloride: 100 mmol/L (ref 98–111)
Creatinine: 0.7 mg/dL (ref 0.44–1.00)
GFR, Estimated: >60 mL/min (ref >60)
Glucose, Bld: 248 mg/dL — ABNORMAL HIGH (ref 70–99)
Potassium: 4.1 mmol/L (ref 3.5–5.1)
Sodium: 136 mmol/L (ref 135–145)
Total Bilirubin: 0.4 mg/dL (ref 0.3–1.2)
Total Protein: 8.3 g/dL — ABNORMAL HIGH (ref 6.5–8.1)

## 2022-07-20 LAB — IRON AND IRON BINDING CAPACITY (CC-WL,HP ONLY)
Iron: 23 ug/dL — ABNORMAL LOW (ref 28–170)
Saturation Ratios: 9 % — ABNORMAL LOW (ref 10.4–31.8)
TIBC: 251 ug/dL (ref 250–450)
UIBC: 228 ug/dL (ref 148–442)

## 2022-07-20 LAB — CBC WITH DIFFERENTIAL (CANCER CENTER ONLY)
Abs Immature Granulocytes: 0.12 10*3/uL — ABNORMAL HIGH (ref 0.00–0.07)
Basophils Absolute: 0 10*3/uL (ref 0.0–0.1)
Basophils Relative: 0 %
Eosinophils Absolute: 0 10*3/uL (ref 0.0–0.5)
Eosinophils Relative: 0 %
HCT: 36.1 % (ref 36.0–46.0)
Hemoglobin: 11.4 g/dL — ABNORMAL LOW (ref 12.0–15.0)
Immature Granulocytes: 5 %
Lymphocytes Relative: 71 %
Lymphs Abs: 1.8 10*3/uL (ref 0.7–4.0)
MCH: 27.5 pg (ref 26.0–34.0)
MCHC: 31.6 g/dL (ref 30.0–36.0)
MCV: 87 fL (ref 80.0–100.0)
Monocytes Absolute: 0.3 10*3/uL (ref 0.1–1.0)
Monocytes Relative: 13 %
Neutro Abs: 0.3 10*3/uL — CL (ref 1.7–7.7)
Neutrophils Relative %: 11 %
Platelet Count: 186 10*3/uL (ref 150–400)
RBC: 4.15 MIL/uL (ref 3.87–5.11)
RDW: 16.4 % — ABNORMAL HIGH (ref 11.5–15.5)
WBC Count: 2.6 10*3/uL — ABNORMAL LOW (ref 4.0–10.5)
nRBC: 0 % (ref 0.0–0.2)

## 2022-07-20 LAB — FERRITIN: Ferritin: 606 ng/mL — ABNORMAL HIGH (ref 11–307)

## 2022-07-20 LAB — RETICULOCYTES
Immature Retic Fract: 23.1 % — ABNORMAL HIGH (ref 2.3–15.9)
RBC.: 4.04 MIL/uL (ref 3.87–5.11)
Retic Count, Absolute: 31.5 10*3/uL (ref 19.0–186.0)
Retic Ct Pct: 0.8 % (ref 0.4–3.1)

## 2022-07-20 LAB — SAVE SMEAR(SSMR), FOR PROVIDER SLIDE REVIEW

## 2022-07-20 LAB — LACTATE DEHYDROGENASE: LDH: 172 U/L (ref 98–192)

## 2022-07-20 MED ORDER — PEGFILGRASTIM-CBQV 6 MG/0.6ML ~~LOC~~ SOSY
6.0000 mg | PREFILLED_SYRINGE | Freq: Once | SUBCUTANEOUS | Status: AC
  Administered 2022-07-20: 6 mg via SUBCUTANEOUS
  Filled 2022-07-20: qty 0.6

## 2022-07-20 MED ORDER — FILGRASTIM-SNDZ 300 MCG/0.5ML IJ SOSY: 300.0000 ug | PREFILLED_SYRINGE | Freq: Once | INTRAMUSCULAR | Status: DC

## 2022-07-20 NOTE — Telephone Encounter (Signed)
Critical lab results received from [place] in lab. Pt's ANC was 0.3. Dr Marin Olp made aware and states pt needs her Neulasta injection today.

## 2022-07-20 NOTE — Progress Notes (Signed)
Hematology and Oncology Follow Up Visit  Kristen Murray [id number] [birth year] 76 y.o. 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  We last saw her back in June.  Since then, she been doing pretty well.  She really has had no specific complaints.  Her appetite has been pretty good.  She has had no nausea or vomiting.  She  She has had no problem with infections.  She has had no change in bowel or bladder habits.  She has had no rashes.  There is been no leg swelling.  She has had no issues with fever.  She has had no headache.  There is been no mouth sores.  Her blood sugars are on the high side.  She really needs to have this adjusted by her family doctor.  Currently, I would say performance status is probably ECOG 1.     Medications:  Current Outpatient Medications:    acetaminophen (TYLENOL) 325 MG tablet, Take 325-650 mg by mouth every 6 (six) hours as needed (for pain)., Disp: , Rfl:    ALPRAZolam (XANAX) 0.25 MG tablet, Take 0.25 mg by mouth 2 (two) times daily as needed., Disp: , Rfl:    azelastine (ASTELIN) 0.1 % nasal spray, Place into both nostrils 2 (two) times daily. Use in each nostril as directed, Disp: , Rfl:    b complex vitamins capsule, daily., Disp: , Rfl:    cetirizine (ZYRTEC) 10 MG tablet, Take 1 tablet (10 mg total) by mouth daily as needed for allergies (Can take an extra dose during flare ups.)., Disp: 180 tablet, Rfl: 4   Cholecalciferol 25 MCG (1000 UT) capsule, Take 1,000 Units by mouth daily., Disp: , Rfl:    clotrimazole-betamethasone (LOTRISONE) cream, Apply 1 Application topically 2 (two) times daily. Use for 2 weeks., Disp: 30 g, Rfl: 0   cyclobenzaprine  (FLEXERIL) 10 MG tablet, Take 2.5 mg by mouth daily as needed for muscle spasms., Disp: , Rfl:    dextromethorphan-guaiFENesin (MUCINEX DM) 30-600 MG 12hr tablet, Take 1 tablet by mouth 2 (two) times daily., Disp: 60 tablet, Rfl: 3   diclofenac (VOLTAREN) 0.1 % ophthalmic solution, 4 (four) times daily as needed., Disp: , Rfl:    ELIQUIS 5 MG TABS tablet, TAKE 1 TABLET TWICE DAILY, Disp: 180 tablet, Rfl: 1   EPINEPHrine 0.3 mg/0.3 mL IJ SOAJ injection, Inject 0.3 mg into the muscle as needed for anaphylaxis., Disp: 1 each, Rfl: 1   fluorometholone (FML) 0.1 % ophthalmic suspension, Place 1 drop into both eyes every Monday, Wednesday, and Friday. , Disp: , Rfl:    fluticasone (FLONASE) 50 MCG/ACT nasal spray, Place 1 spray into both nostrils 2 (two) times daily., Disp: 48 g, Rfl: 4   folic acid (FOLVITE) 716 MCG tablet, daily., Disp: , Rfl:    glipiZIDE (GLUCOTROL XL) 10 MG 24 hr tablet, Take 10 mg by mouth daily., Disp: , Rfl:    glucose blood (FREESTYLE LITE) test strip, CHECK BLOOD SUGAR ONCE DAILY AS DIRECTED.  DX:790.29, Disp: 100 each, Rfl: 12   glucose blood test strip, Inject 1 strip into the skin daily., Disp: , Rfl:    ipratropium (  ATROVENT) 0.06 % nasal spray, Place 2 sprays into both nostrils 3 (three) times daily., Disp: 15 mL, Rfl: 11   Lancets (FREESTYLE) lancets, Check blood sugar once daily as directed DX:790.29 (FREESTYLE FREEDOM LITE), Disp: 100 each, Rfl: 1   lidocaine (XYLOCAINE) 5 % ointment, Apply 1 application topically 3 (three) times daily as needed., Disp: 1.25 g, Rfl: 0   loratadine (CLARITIN) 10 MG tablet, Take 1 tablet (10 mg total) by mouth daily., Disp: 30 tablet, Rfl: 0   metoprolol tartrate (LOPRESSOR) 50 MG tablet, Take 1.5 tablets (75 mg total) by mouth 2 (two) times daily., Disp: 270 tablet, Rfl: 3   montelukast (SINGULAIR) 10 MG tablet, Take 1 tablet (10 mg total) by mouth at bedtime., Disp: 90 tablet, Rfl: 4   Multiple Vitamin (MULTIVITAMIN) tablet, Take 1  tablet by mouth daily. [place] brand, Disp: , Rfl:    nitroGLYCERIN (NITROSTAT) 0.4 MG SL tablet, Place 1 tablet (0.4 mg total) under the tongue every 5 (five) minutes as needed for chest pain., Disp: 25 tablet, Rfl: 3   pantoprazole (PROTONIX) 40 MG tablet, Take 1 tablet (40 mg total) by mouth 2 (two) times daily., Disp: 180 tablet, Rfl: 4   rosuvastatin (CRESTOR) 5 MG tablet, Take 5 mg by mouth See admin instructions. Take 5 mg by mouth at bedtime on Mondays and Thursdays..Currently on hold, Disp: , Rfl:    tretinoin (RETIN-A) 0.025 % cream, Apply 1 application. topically at bedtime., Disp: , Rfl:    trolamine salicylate (ASPERCREME) 10 % cream, Apply 1 application topically as needed for muscle pain., Disp: , Rfl:    valACYclovir (VALTREX) 500 MG tablet, Take 1 tablet (500 mg total) by mouth 2 (two) times daily. Take twice a day for thee days as needed for an outbreak., Disp: 30 tablet, Rfl: 1   verapamil (CALAN-SR) 180 MG CR tablet, Take 180 mg by mouth at bedtime. , Disp: , Rfl:  No current facility-administered medications for this visit.  Facility-Administered Medications Ordered in Other Visits:    filgrastim-sndz (ZARXIO) injection 300 mcg, 300 mcg, Subcutaneous, Once, , Rudell Cobb, MD  Allergies:  Allergies  Allergen Reactions   Codeine Hives and Other (See Comments)    Because of a history of documented adverse serious drug reaction, Medi Alert bracelet  is recommended   Macrodantin [Nitrofurantoin Macrocrystal] Other (See Comments)    Numbness, aching all over   Other Shortness Of Breath, Rash and Other (See Comments)    Pine nuts & walnuts throat congestion. No documented angioedema.    Propoxyphene Hives, Swelling, Rash and Other (See Comments)   Propoxyphene Hcl Hives, Swelling and Rash   Dulaglutide Other (See Comments)    Other reaction(s): weak, ha, no appetite   Latex Rash and Other (See Comments)   Metformin Hcl Other (See Comments)    Other reaction(s): leg  discomfort   Neosporin [Bacitracin-Polymyxin B] Other (See Comments)    Blisters   Zoster Vac Recomb Adjuvanted Rash and Other (See Comments)    Other reaction(s): confusion   Avocado Rash   Banana Rash   Ciprofloxacin Nausea Only and Other (See Comments)    Nausea (Note: Patient takes this when on mission trips, however)   Tizanidine Other (See Comments)    Reaction not recalled   Tramadol Nausea Only    Past Medical History, Surgical history, Social history, and Family History were reviewed and updated.  Review of Systems: Review of Systems  Constitutional: Negative.   HENT:  Negative.  Eyes: Negative.   Respiratory: Negative.    Cardiovascular: Negative.   Gastrointestinal: Negative.   Endocrine: Negative.   Genitourinary: Negative.    Musculoskeletal: Negative.   Skin: Negative.   Neurological: Negative.   Hematological: Negative.   Psychiatric/Behavioral: Negative.      Physical Exam:  height is '5\' 4"'$  (1.626 m) and weight is 169 lb (76.7 kg). Her oral temperature is 98.1 F (36.7 C). Her blood pressure is 136/64 and her pulse is 86. Her respiration is 16 and oxygen saturation is 98%.   Wt Readings from Last 3 Encounters:  07/20/22 169 lb (76.7 kg)  07/07/22 167 lb (75.8 kg)  05/30/22 168 lb (76.2 kg)    Physical Exam Vitals reviewed.  HENT:     Head: Normocephalic and atraumatic.  Eyes:     Pupils: Pupils are equal, round, and reactive to light.  Cardiovascular:     Rate and Rhythm: Normal rate and regular rhythm.     Heart sounds: Normal heart sounds.  Pulmonary:     Effort: Pulmonary effort is normal.     Breath sounds: Normal breath sounds.  Abdominal:     General: Bowel sounds are normal.     Palpations: Abdomen is soft.  Musculoskeletal:        General: No tenderness or deformity. Normal range of motion.     Cervical back: Normal range of motion.     Comments: There is moderate swelling of the left calf area.  This is somewhat tender to  palpation.  There is no redness.  I cannot palpate any obvious venous cord.  There is no pain behind the left knee.  She has decent pulses in her distal extremities.  Lymphadenopathy:     Cervical: No cervical adenopathy.  Skin:    General: Skin is warm and dry.     Findings: No erythema or rash.  Neurological:     Mental Status: She is alert and oriented to person, place, and time.  Psychiatric:        Behavior: Behavior normal.        Thought Content: Thought content normal.        Judgment: Judgment normal.     Lab Results  Component Value Date   WBC 2.6 (L) 07/20/2022   HGB 11.4 (L) 07/20/2022   HCT 36.1 07/20/2022   MCV 87.0 07/20/2022   PLT 186 07/20/2022     Chemistry      Component Value Date/Time   NA 135 05/13/2022 1844   NA 138 03/10/2020 1138   K 3.8 05/13/2022 1844   CL 99 05/13/2022 1844   CO2 27 05/13/2022 1844   BUN 12 05/13/2022 1844   BUN 10 03/10/2020 1138   CREATININE 0.62 05/13/2022 1844   CREATININE 0.62 04/27/2022 1002      Component Value Date/Time   CALCIUM 9.0 05/13/2022 1844   ALKPHOS 66 05/13/2022 1844   AST 37 05/13/2022 1844   AST 10 (L) 04/27/2022 1002   ALT 29 05/13/2022 1844   ALT 12 04/27/2022 1002   BILITOT 0.8 05/13/2022 1844   BILITOT 0.6 04/27/2022 1002       Impression and Plan:  Ms. Vandenboom is a 76 year old African-American female.   Looks like she does have a low-grade myelodysplasia.  Again she has a very low IPSS-R score.  I am a little disappointed that her white cell count is still low right now.  She has not had Neulasta for quite a while.  We  will have to give her a dose today.  I would like to see her back in 4-6 weeks.  I really want to make sure that we work on this white cell count for her so that she does not run into problems over the holiday season.  We do not have to give her any Aranesp today.  [name], MD 9/21/202310:31 AM

## 2022-07-20 NOTE — Progress Notes (Signed)
[facility] orders entered per Dr. Antonieta Pert instructions. Patient will not receive Zarxio.

## 2022-07-20 NOTE — Patient Instructions (Addendum)

## 2022-07-21 ENCOUNTER — Telehealth: Payer: Self-pay | Admitting: *Deleted

## 2022-07-21 ENCOUNTER — Ambulatory Visit: Payer: Medicare HMO | Attending: Physician Assistant | Admitting: Physician Assistant

## 2022-07-21 ENCOUNTER — Telehealth: Payer: Self-pay

## 2022-07-21 ENCOUNTER — Telehealth: Payer: Self-pay | Admitting: Cardiovascular Disease

## 2022-07-21 DIAGNOSIS — M170 Bilateral primary osteoarthritis of knee: Secondary | ICD-10-CM | POA: Diagnosis not present

## 2022-07-21 DIAGNOSIS — Z01810 Encounter for preprocedural cardiovascular examination: Secondary | ICD-10-CM

## 2022-07-21 DIAGNOSIS — E1165 Type 2 diabetes mellitus with hyperglycemia: Secondary | ICD-10-CM | POA: Diagnosis not present

## 2022-07-21 DIAGNOSIS — K219 Gastro-esophageal reflux disease without esophagitis: Secondary | ICD-10-CM | POA: Diagnosis not present

## 2022-07-21 DIAGNOSIS — E7800 Pure hypercholesterolemia, unspecified: Secondary | ICD-10-CM | POA: Diagnosis not present

## 2022-07-21 NOTE — Telephone Encounter (Signed)
Patient with diagnosis of A fib on Eliquis for anticoagulation.    Procedure: colonoscopy Date of procedure: 07/27/22   CHA2DS2-VASc Score = 6  This indicates a 9.7% annual risk of stroke. The patient's score is based upon: CHF History: 0 HTN History: 1 Diabetes History: 1 Stroke History: 0 Vascular Disease History: 1 Age Score: 2 Gender Score: 1    CrCl 65m/min Platelet count 186K   Per office protocol, patient can hold Eliquis for 2 days prior to procedure.     **This guidance is not considered finalized until pre-operative APP has relayed final recommendations.**;

## 2022-07-21 NOTE — Telephone Encounter (Signed)
Tele appt today

## 2022-07-21 NOTE — Telephone Encounter (Signed)
Pt scheduled for urgent tele pre op appt due to med hold time and procedure date. Med rec to be done by provider today.   Consent has been given.      Patient Consent for Virtual Visit        Kristen Murray has provided verbal consent on 07/21/2022 for a virtual visit (video or telephone).   CONSENT FOR VIRTUAL VISIT FOR:  Kristen Murray  By participating in this virtual visit I agree to the following:  I hereby voluntarily request, consent and authorize [facility] and its employed or contracted physicians, physician assistants, nurse practitioners or other licensed health care professionals (the Practitioner), to provide me with telemedicine health care services (the "Services") as deemed necessary by the treating Practitioner. I acknowledge and consent to receive the Services by the Practitioner via telemedicine. I understand that the telemedicine visit will involve communicating with the Practitioner through live audiovisual communication technology and the disclosure of certain medical information by electronic transmission. I acknowledge that I have been given the opportunity to request an in-person assessment or other available alternative prior to the telemedicine visit and am voluntarily participating in the telemedicine visit.  I understand that I have the right to withhold or withdraw my consent to the use of telemedicine in the course of my care at any time, without affecting my right to future care or treatment, and that the Practitioner or I may terminate the telemedicine visit at any time. I understand that I have the right to inspect all information obtained and/or recorded in the course of the telemedicine visit and may receive copies of available information for a reasonable fee.  I understand that some of the potential risks of receiving the Services via telemedicine include:  Delay or interruption in medical evaluation due to technological equipment failure or  disruption; Information transmitted may not be sufficient (e.g. poor resolution of images) to allow for appropriate medical decision making by the Practitioner; and/or  In rare instances, security protocols could fail, causing a breach of personal health information.  Furthermore, I acknowledge that it is my responsibility to provide information about my medical history, conditions and care that is complete and accurate to the best of my ability. I acknowledge that Practitioner's advice, recommendations, and/or decision may be based on factors not within their control, such as incomplete or inaccurate data provided by me or distortions of diagnostic images or specimens that may result from electronic transmissions. I understand that the practice of medicine is not an exact science and that Practitioner makes no warranties or guarantees regarding treatment outcomes. I acknowledge that a copy of this consent can be made available to me via my patient portal ([facility]), or I can request a printed copy by calling the office of [facility].    I understand that my insurance will be billed for this visit.   I have read or had this consent read to me. I understand the contents of this consent, which adequately explains the benefits and risks of the Services being provided via telemedicine.  I have been provided ample opportunity to ask questions regarding this consent and the Services and have had my questions answered to my satisfaction. I give my informed consent for the services to be provided through the use of telemedicine in my medical care

## 2022-07-21 NOTE — Telephone Encounter (Signed)
Pt scheduled for urgent tele pre op appt due to med hold time and procedure date. Med rec to be done by provider today.   Consent has been given.

## 2022-07-21 NOTE — Telephone Encounter (Signed)
   Name: Kristen Murray  DOB: [birth year]  MRN: [id number]  Primary Cardiologist: Mertie Moores, MD  Chart reviewed as part of pre-operative protocol coverage. Because of Kristen Murray's past medical history and time since last visit, she will require a follow-up telephone visit in order to better assess preoperative cardiovascular risk.  Pre-op covering staff: - Please schedule appointment and call patient to inform them. If patient already had an upcoming appointment within acceptable timeframe, please add "pre-op clearance" to the appointment notes so provider is aware. - Please contact requesting surgeon's office via preferred method (i.e, phone, fax) to inform them of need for appointment prior to surgery.   Per office protocol, patient can hold Eliquis for 2 days prior to procedure.     Elgie Collard, PA-C  07/21/2022, 3:40 PM

## 2022-07-21 NOTE — Telephone Encounter (Signed)
**Note De-Identified  Obfuscation** No answer so I left a VM asking the pt to call us back at [phone].  The only generic anticoagulant is Warfarin which is much less expensive than Eliquis and all others are name brand and will cost her about the same as Eliquis.  She can consider switching to Xarelto, if ok with Dr Acie Fredrickson, as there is a discount program, [employer], maintenance that offers Xarelto for $85/30 day supply or $240/90 day supply. She can reach them at [phone] to enroll in the program (does not require an application).

## 2022-07-21 NOTE — Telephone Encounter (Signed)
Patient returned call to office. Provided information from [facility] and patient states that she will continue on Eliquis rather than attempting to switch anticoagulants. She did question if we had samples she could pick up and I advised that we can only provide samples if she's applied for patient assistance and awaiting response from the company. States understanding.  Also questions holding of Eliquis prior to her scheduled colonoscopy on 07/27/22. A cardiac clearance has been entered, but I don't see further instruction. Will route to pre-op pool for follow-up.

## 2022-07-21 NOTE — Telephone Encounter (Signed)
Pt c/o medication issue:  1. Name of Medication:  ELIQUIS 5 MG TABS tablet   2. How are you currently taking this medication (dosage and times per day)?   3. Are you having a reaction (difficulty breathing--STAT)?   4. What is your medication issue?   Prior to having medication refilled patient would like to know if there is a less expensive alternative she can be placed on. Please advise.

## 2022-07-21 NOTE — Telephone Encounter (Signed)
   Pre-operative Risk Assessment    Patient Name: Kristen Murray  DOB: 1946-03-24 MRN: [id number]      Request for Surgical Clearance    Procedure:   COLONSCOPY  Date of Surgery:  Clearance 07/27/22                                 Surgeon:  DR Paulita Fujita Surgeon's Group or Practice Name:  [facility] Phone number:  [phone] Fax number:  [phone]   Type of Clearance Requested:   - Medical  - Pharmacy:  Hold Apixaban (Eliquis)     Type of Anesthesia:  General :PROPOFOL   Additional requests/questions:    [name], [name]   07/21/2022, 1:19 PM

## 2022-07-21 NOTE — Progress Notes (Signed)
Virtual Visit via Telephone Note   Because of Kristen Murray's co-morbid illnesses, she is at least at moderate risk for complications without adequate follow up.  This format is felt to be most appropriate for this patient at this time.  The patient did not have access to video technology/had technical difficulties with video requiring transitioning to audio format only (telephone).  All issues noted in this document were discussed and addressed.  No physical exam could be performed with this format.  Please refer to the patient's chart for her consent to telehealth for [facility].  Evaluation Performed:  Preoperative cardiovascular risk assessment _____________   Date:  07/21/2022   Patient ID:  Kristen Murray, DOB 1946/04/29, MRN [id number] Patient Location:  Home Provider location:   Office  Primary Care Provider:  [name], MD Primary Cardiologist:  Mertie Moores, MD  Chief Complaint / Patient Profile   76 y.o. y/o female with a h/o diabetes mellitus, reflux, hyperlipidemia, hypertension, and atrial flutter who is pending colonoscopy and presents today for telephonic preoperative cardiovascular risk assessment.  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    Nephrolithiasis    Dr Amalia Hailey, [facility], Hx of [3][   Polyp of colon    Radiculopathy    Past Surgical History:  Procedure Laterality Date   ABDOMINAL HYSTERECTOMY     TAH/BSO --fibroids, no cancer   BALLOON DILATION N/A 07/20/2014   Procedure: BALLOON DILATION;  Surgeon: Garlan Fair, MD;  Location: [facility] ENDOSCOPY;  Service: Endoscopy;  Laterality: N/A;   BREAST BIOPSY     Right   CARPAL TUNNEL RELEASE     & trigger thumb RUE; Dr Alphonzo Cruise   COLONOSCOPY W/ POLYPECTOMY  07/2004   Neg 2011; Dr Earle Gell   CYSTOSCOPY  11/2009   Neg   ESOPHAGOGASTRODUODENOSCOPY N/A 07/20/2014   Procedure: ESOPHAGOGASTRODUODENOSCOPY (EGD);  Surgeon: Garlan Fair, MD;  Location: [facility];  Service: Endoscopy;  Laterality: N/A;   FLEXIBLE BRONCHOSCOPY W/ UPPER ENDOSCOPY     neg   KNEE ARTHROSCOPY     Left   ROTATOR CUFF REPAIR     R shoulder   TONSILLECTOMY     TRIGGER FINGER RELEASE Right    Right Thumb    Allergies  Allergies  Allergen Reactions   Codeine Hives and Other (See Comments)    Because of a history of documented adverse serious drug reaction, Medi Alert bracelet  is recommended   Macrodantin [Nitrofurantoin Macrocrystal] Other (See Comments)    Numbness, aching all over   Other Shortness Of Breath, Rash and Other (See Comments)    Pine nuts & walnuts throat congestion. No documented angioedema.  Propoxyphene Hives, Swelling, Rash and Other (See Comments)   Propoxyphene Hcl Hives, Swelling and Rash   Dulaglutide Other (See Comments)    Other reaction(s): weak, ha, no appetite   Latex Rash and Other (See Comments)   Metformin Hcl Other (See Comments)    Other reaction(s): leg discomfort   Neosporin [Bacitracin-Polymyxin B] Other (See Comments)    Blisters   Zoster Vac Recomb Adjuvanted Rash and Other (See Comments)    Other reaction(s): confusion   Avocado Rash   Banana Rash   Ciprofloxacin Nausea Only and Other (See Comments)    Nausea (Note: Patient takes this  when on mission trips, however)   Tizanidine Other (See Comments)    Reaction not recalled   Tramadol Nausea Only    History of Present Illness    Kristen Murray is a 76 y.o. female who presents via audio/video conferencing for a telehealth visit today.  Pt was last seen in cardiology clinic on 11/18/2021 by Dr. Acie Fredrickson.  At that time Kristen Murray was doing well .  The patient is now pending procedure as outlined above. Since her last visit, she is very concerned because her WBC was 0.3 and she was given some medication. She cooks and goes Midwife. She has a tear in her left shoulder which slows her down sometimes. Arthritis in both of her knees.  She denies chest pain, shortness of breath, and swelling in her feet and legs.  Once in a while she says she feels her heart is a flip-flop but this does not last very long.  She does all of her own outdoor work and has no trouble with walking.  For this reason she scored a 5.07 on the DASI.  This exceeds the minimum 4 METS requirement.  Okay to hold Eliquis x 2 days prior to procedure. Resume when medically safe to do so.    Home Medications    Prior to Admission medications   Medication Sig Start Date End Date Taking? Authorizing Provider  acetaminophen (TYLENOL) 325 MG tablet Take 325-650 mg by mouth every 6 (six) hours as needed (for pain).    [provider]  ALPRAZolam Duanne Moron) 0.25 MG tablet Take 0.25 mg by mouth 2 (two) times daily as needed. 04/19/21   [provider]  azelastine (ASTELIN) 0.1 % nasal spray Place into both nostrils 2 (two) times daily. Use in each nostril as directed    [provider]  b complex vitamins capsule daily.    [provider]  cetirizine (ZYRTEC) 10 MG tablet Take 1 tablet (10 mg total) by mouth daily as needed for allergies (Can take an extra dose during flare ups.). 05/30/22   Kozlow, Donnamarie Poag, MD  Cholecalciferol 25 MCG (1000 UT) capsule Take 1,000 Units by mouth daily.     [provider]  clotrimazole-betamethasone (LOTRISONE) cream Apply 1 Application topically 2 (two) times daily. Use for 2 weeks. 07/14/22   Nunzio Cobbs, MD  cyclobenzaprine (FLEXERIL) 10 MG tablet Take 2.5 mg by mouth daily as needed for muscle spasms.    [provider]  dextromethorphan-guaiFENesin (MUCINEX DM) 30-600 MG 12hr tablet Take 1 tablet by mouth 2 (two) times daily. 05/30/22   Kozlow, Donnamarie Poag, MD  diclofenac (VOLTAREN) 0.1 % ophthalmic solution 4 (four) times daily as needed.    [provider]  ELIQUIS 5 MG TABS tablet TAKE 1 TABLET TWICE DAILY 05/10/22   Nahser, Wonda Cheng, MD  EPINEPHrine 0.3  mg/0.3 mL IJ SOAJ injection Inject 0.3 mg into the muscle as needed for anaphylaxis. 10/03/21   Althea Charon, FNP  fluorometholone (FML) 0.1 % ophthalmic suspension Place 1 drop into both eyes every Monday, Wednesday, and Friday.     [provider]  fluticasone (FLONASE) 50 MCG/ACT nasal spray Place 1 spray into both nostrils 2 (two) times daily. 05/30/22   Kozlow, Donnamarie Poag, MD  folic acid (FOLVITE) 222 MCG tablet daily.    [provider]  glipiZIDE (GLUCOTROL XL) 10 MG 24 hr tablet Take 10 mg by mouth daily. 05/05/22   [provider]  glucose blood (FREESTYLE LITE) test strip CHECK BLOOD SUGAR ONCE DAILY AS DIRECTED.  DX:790.29 01/09/14   Hendricks Limes, MD  glucose blood test strip Inject 1 strip into the skin daily.    [provider]  ipratropium (ATROVENT) 0.06 % nasal spray Place 2 sprays into both nostrils 3 (three) times daily. 05/30/22   Kozlow, Donnamarie Poag, MD  Lancets (FREESTYLE) lancets Check blood sugar once daily as directed DX:790.29 (FREESTYLE FREEDOM LITE) 01/09/14   Hendricks Limes, MD  lidocaine (XYLOCAINE) 5 % ointment Apply 1 application topically 3 (three) times daily as needed. 06/06/21   Nunzio Cobbs, MD  loratadine (CLARITIN) 10 MG tablet Take 1 tablet (10 mg total) by mouth daily. 01/10/20    Isla Pence, MD  metoprolol tartrate (LOPRESSOR) 50 MG tablet Take 1.5 tablets (75 mg total) by mouth 2 (two) times daily. 12/20/21   Nahser, Wonda Cheng, MD  montelukast (SINGULAIR) 10 MG tablet Take 1 tablet (10 mg total) by mouth at bedtime. 05/30/22   Kozlow, Donnamarie Poag, MD  Multiple Vitamin (MULTIVITAMIN) tablet Take 1 tablet by mouth daily. Shaklee brand    [provider]  nitroGLYCERIN (NITROSTAT) 0.4 MG SL tablet Place 1 tablet (0.4 mg total) under the tongue every 5 (five) minutes as needed for chest pain. 01/11/21   Richardson Dopp T, PA-C  pantoprazole (PROTONIX) 40 MG tablet Take 1 tablet (40 mg total) by mouth 2 (two) times daily. 05/30/22   Kozlow, Donnamarie Poag, MD  rosuvastatin (CRESTOR) 5 MG tablet Take 5 mg by mouth See admin instructions. Take 5 mg by mouth at bedtime on Mondays and Thursdays..Currently on hold 11/20/19   [provider]  tretinoin (RETIN-A) 0.025 % cream Apply 1 application. topically at bedtime. 12/14/21   [provider]  trolamine salicylate (ASPERCREME) 10 % cream Apply 1 application topically as needed for muscle pain.    [provider]  valACYclovir (VALTREX) 500 MG tablet Take 1 tablet (500 mg total) by mouth 2 (two) times daily. Take twice a day for thee days as needed for an outbreak. 01/04/22   Nunzio Cobbs, MD  verapamil (CALAN-SR) 180 MG CR tablet Take 180 mg by mouth at bedtime.  01/02/17   [provider]    Physical Exam    Vital Signs:  Kristen Murray does not have vital signs available for review today.  Given telephonic nature of communication, physical exam is limited. AAOx3. NAD. Normal affect.  Speech and respirations are unlabored.  Accessory Clinical Findings    None  Assessment & Plan    1.  Preoperative Cardiovascular Risk Assessment:  Kristen Murray perioperative risk of a major cardiac event is 0.9% according to the Revised Cardiac Risk Index (RCRI).  Therefore, she is at low risk for  perioperative complications.   Her functional capacity is good at  5.07 METs according to the [place] Activity Status Index (DASI). Recommendations: According to ACC/AHA guidelines, no further cardiovascular testing needed.  The patient may proceed to surgery at acceptable risk.   Antiplatelet and/or Anticoagulation Recommendations:  Eliquis (Apixaban) can be held for 2 days prior to surgery.  Please resume post op when felt to be safe.     A copy of this note will be routed to requesting surgeon.  Time:   Today, I have spent 10 minutes with the patient with telehealth technology discussing medical history, symptoms, and management plan.     Elgie Collard, PA-C  07/21/2022, 3:49 PM

## 2022-07-24 ENCOUNTER — Telehealth: Payer: Self-pay | Admitting: *Deleted

## 2022-07-24 ENCOUNTER — Ambulatory Visit
Admission: RE | Admit: 2022-07-24 | Discharge: 2022-07-24 | Disposition: A | Discharge status: Home / Self Care | Payer: Medicare HMO | Source: Ambulatory Visit | Attending: Physician Assistant | Admitting: Physician Assistant

## 2022-07-24 ENCOUNTER — Telehealth: Payer: Self-pay | Admitting: Cardiovascular Disease

## 2022-07-24 ENCOUNTER — Other Ambulatory Visit: Payer: Self-pay | Admitting: Physician Assistant

## 2022-07-24 DIAGNOSIS — R102 Pelvic and perineal pain: Secondary | ICD-10-CM | POA: Diagnosis not present

## 2022-07-24 DIAGNOSIS — M545 Low back pain, unspecified: Secondary | ICD-10-CM

## 2022-07-24 DIAGNOSIS — E1165 Type 2 diabetes mellitus with hyperglycemia: Secondary | ICD-10-CM | POA: Diagnosis not present

## 2022-07-24 DIAGNOSIS — M25512 Pain in left shoulder: Secondary | ICD-10-CM | POA: Diagnosis not present

## 2022-07-24 NOTE — Telephone Encounter (Signed)
Patient states her white blood cell count was at 0.3 and glucose was mildly elevated at 152 during recent lab work. She would like to know if Dr. Acie Fredrickson has any advisement.

## 2022-07-24 NOTE — Telephone Encounter (Signed)
Patient called asking if she can still have her colonoscopy this week because of her low WBC last week.  Per Dr Marin Olp, yes it is ok because of the Udenyca injection we gave last week to raise WBC. Patient appreciates call

## 2022-07-24 NOTE — Telephone Encounter (Signed)
Pt called to alert [place] that her Neutro results on her recent CBC with Dr Marin Olp was very low 0.3.Marland Kitchen. her glucose was up to 152 but she was non fating... Dr Marin Olp gave her an injection... she was asking if Dr Acie Fredrickson was still okay with her having her Colonoscopy with her labs... I advised her to call Dr Paulita Fujita and let him know since he is doing the Colonoscopy this Thursday... she says she will call him and let us know if she needs anything else from [place] from a cardiac standpoint.

## 2022-07-25 DIAGNOSIS — M25512 Pain in left shoulder: Secondary | ICD-10-CM | POA: Diagnosis not present

## 2022-07-28 DIAGNOSIS — M25512 Pain in left shoulder: Secondary | ICD-10-CM | POA: Diagnosis not present

## 2022-08-07 DIAGNOSIS — R1013 Epigastric pain: Secondary | ICD-10-CM | POA: Diagnosis not present

## 2022-08-07 DIAGNOSIS — R42 Dizziness and giddiness: Secondary | ICD-10-CM | POA: Diagnosis not present

## 2022-08-07 DIAGNOSIS — R310 Gross hematuria: Secondary | ICD-10-CM | POA: Diagnosis not present

## 2022-08-09 DIAGNOSIS — M25512 Pain in left shoulder: Secondary | ICD-10-CM | POA: Diagnosis not present

## 2022-08-15 DIAGNOSIS — K219 Gastro-esophageal reflux disease without esophagitis: Secondary | ICD-10-CM | POA: Diagnosis not present

## 2022-08-15 DIAGNOSIS — M170 Bilateral primary osteoarthritis of knee: Secondary | ICD-10-CM | POA: Diagnosis not present

## 2022-08-15 DIAGNOSIS — E1165 Type 2 diabetes mellitus with hyperglycemia: Secondary | ICD-10-CM | POA: Diagnosis not present

## 2022-08-15 DIAGNOSIS — D649 Anemia, unspecified: Secondary | ICD-10-CM | POA: Diagnosis not present

## 2022-08-15 DIAGNOSIS — E7800 Pure hypercholesterolemia, unspecified: Secondary | ICD-10-CM | POA: Diagnosis not present

## 2022-08-21 DIAGNOSIS — Z1211 Encounter for screening for malignant neoplasm of colon: Secondary | ICD-10-CM | POA: Diagnosis not present

## 2022-08-21 DIAGNOSIS — D509 Iron deficiency anemia, unspecified: Secondary | ICD-10-CM | POA: Diagnosis not present

## 2022-09-04 DIAGNOSIS — E1165 Type 2 diabetes mellitus with hyperglycemia: Secondary | ICD-10-CM | POA: Diagnosis not present

## 2022-09-04 DIAGNOSIS — M436 Torticollis: Secondary | ICD-10-CM | POA: Diagnosis not present

## 2022-09-05 ENCOUNTER — Encounter: Payer: Self-pay | Admitting: Family

## 2022-09-05 ENCOUNTER — Inpatient Hospital Stay: Payer: Medicare HMO | Attending: Hematology & Oncology

## 2022-09-05 ENCOUNTER — Inpatient Hospital Stay: Payer: Medicare HMO | Admitting: Family

## 2022-09-05 ENCOUNTER — Other Ambulatory Visit: Payer: Self-pay

## 2022-09-05 ENCOUNTER — Telehealth: Payer: Self-pay | Admitting: *Deleted

## 2022-09-05 ENCOUNTER — Inpatient Hospital Stay: Payer: Medicare HMO

## 2022-09-05 VITALS — BP 112/53 | HR 85 | Temp 98.5°F | Resp 18 | Ht 64.0 in | Wt 167.8 lb

## 2022-09-05 DIAGNOSIS — D46Z Other myelodysplastic syndromes: Secondary | ICD-10-CM | POA: Diagnosis not present

## 2022-09-05 DIAGNOSIS — D708 Other neutropenia: Secondary | ICD-10-CM | POA: Diagnosis not present

## 2022-09-05 DIAGNOSIS — D500 Iron deficiency anemia secondary to blood loss (chronic): Secondary | ICD-10-CM

## 2022-09-05 DIAGNOSIS — D469 Myelodysplastic syndrome, unspecified: Secondary | ICD-10-CM | POA: Diagnosis not present

## 2022-09-05 DIAGNOSIS — D72819 Decreased white blood cell count, unspecified: Secondary | ICD-10-CM

## 2022-09-05 LAB — SAVE SMEAR(SSMR), FOR PROVIDER SLIDE REVIEW

## 2022-09-05 LAB — CMP (CANCER CENTER ONLY)
ALT: 9 U/L (ref 0–44)
AST: 12 U/L — ABNORMAL LOW (ref 15–41)
Albumin: 4.1 g/dL (ref 3.5–5.0)
Alkaline Phosphatase: 75 U/L (ref 38–126)
Anion gap: 8 (ref 5–15)
BUN: 18 mg/dL (ref 8–23)
CO2: 28 mmol/L (ref 22–32)
Calcium: 10.5 mg/dL — ABNORMAL HIGH (ref 8.9–10.3)
Chloride: 101 mmol/L (ref 98–111)
Creatinine: 0.69 mg/dL (ref 0.44–1.00)
GFR, Estimated: >60 mL/min (ref >60)
Glucose, Bld: 207 mg/dL — ABNORMAL HIGH (ref 70–99)
Potassium: 4.4 mmol/L (ref 3.5–5.1)
Sodium: 137 mmol/L (ref 135–145)
Total Bilirubin: 0.4 mg/dL (ref 0.3–1.2)
Total Protein: 8.2 g/dL — ABNORMAL HIGH (ref 6.5–8.1)

## 2022-09-05 LAB — CBC WITH DIFFERENTIAL (CANCER CENTER ONLY)
Abs Immature Granulocytes: 0 10*3/uL (ref 0.00–0.07)
Basophils Absolute: 0 10*3/uL (ref 0.0–0.1)
Basophils Relative: 0 %
Eosinophils Absolute: 0 10*3/uL (ref 0.0–0.5)
Eosinophils Relative: 0 %
HCT: 37.2 % (ref 36.0–46.0)
Hemoglobin: 11.6 g/dL — ABNORMAL LOW (ref 12.0–15.0)
Immature Granulocytes: 0 %
Lymphocytes Relative: 75 %
Lymphs Abs: 1.7 10*3/uL (ref 0.7–4.0)
MCH: 27 pg (ref 26.0–34.0)
MCHC: 31.2 g/dL (ref 30.0–36.0)
MCV: 86.7 fL (ref 80.0–100.0)
Monocytes Absolute: 0.3 10*3/uL (ref 0.1–1.0)
Monocytes Relative: 13 %
Neutro Abs: 0.3 10*3/uL — CL (ref 1.7–7.7)
Neutrophils Relative %: 12 %
Platelet Count: 171 10*3/uL (ref 150–400)
RBC: 4.29 MIL/uL (ref 3.87–5.11)
RDW: 17.3 % — ABNORMAL HIGH (ref 11.5–15.5)
Smear Review: ADEQUATE
WBC Count: 2.3 10*3/uL — ABNORMAL LOW (ref 4.0–10.5)
WBC Morphology: ABNORMAL
nRBC: 0 % (ref 0.0–0.2)

## 2022-09-05 LAB — LACTATE DEHYDROGENASE: LDH: 162 U/L (ref 98–192)

## 2022-09-05 LAB — IRON AND IRON BINDING CAPACITY (CC-WL,HP ONLY)
Iron: 157 ug/dL (ref 28–170)
Saturation Ratios: 63 % — ABNORMAL HIGH (ref 10.4–31.8)
TIBC: 248 ug/dL — ABNORMAL LOW (ref 250–450)
UIBC: 91 ug/dL — ABNORMAL LOW (ref 148–442)

## 2022-09-05 LAB — FERRITIN: Ferritin: 582 ng/mL — ABNORMAL HIGH (ref 11–307)

## 2022-09-05 MED ORDER — PEGFILGRASTIM-CBQV 6 MG/0.6ML ~~LOC~~ SOSY
6.0000 mg | PREFILLED_SYRINGE | Freq: Once | SUBCUTANEOUS | Status: AC
  Administered 2022-09-05: 6 mg via SUBCUTANEOUS
  Filled 2022-09-05: qty 0.6

## 2022-09-05 NOTE — Telephone Encounter (Signed)
Joy from [facility] lab brought to my attention a panic lab result n Neutrophils of 0.27. Results given to Gillian Shields.

## 2022-09-05 NOTE — Progress Notes (Signed)
Hematology and Oncology Follow Up Visit  Kristen Murray [id number] 02-13-1946 76 y.o. 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 is here today for follow-up. She is doing well but does note fatigue at times.  She has not noted any blood loss. No abnormal bruising, no petechiae.  Hgb is stable at 11.6, MCV 86 and platelets 171. Her WBC count is 2.3.  No fever, chills, n/v, cough, rash, dizziness, SOB, chest pain, palpitations, abdominal pain or changes in bowel or bladder habits.  She has puffiness that comes and goes in her left foot and ankle. Pedal pulses are 2+.  No numbness or tingling in her extremities at this time.  No falls or syncope reported.  Appetite and hydration are good. She has really been making an effort to reduce her sugar and carbohydrate intake. Her weight is stable at 167 lbs.   ECOG Performance Status: 1 - Symptomatic but completely ambulatory  Medications:  Allergies as of 09/05/2022       Reactions   Codeine Hives, Other (See Comments)   Because of a history of documented adverse serious drug reaction, Medi Alert bracelet  is recommended   Macrodantin [nitrofurantoin Macrocrystal] Other (See Comments)   Numbness, aching all over   Other Shortness Of Breath, Rash, Other (See Comments)   Pine nuts & walnuts throat congestion. No documented angioedema.   Propoxyphene Hives, Swelling, Rash, Other (See Comments)   Propoxyphene Hcl Hives, Swelling, Rash   Dulaglutide Other (See Comments)   Other reaction(s): weak, ha, no appetite   Latex Rash, Other (See Comments)   Metformin Hcl Other (See Comments)   Other reaction(s): leg discomfort   Neosporin [bacitracin-polymyxin B] Other (See Comments)   Blisters    Zoster Vac Recomb Adjuvanted Rash, Other (See Comments)   Other reaction(s): confusion   Avocado Rash   Banana Rash   Ciprofloxacin Nausea Only, Other (See Comments)   Nausea (Note: Patient takes this when on mission trips, however)   Tizanidine Other (See Comments)   Reaction not recalled   Tramadol Nausea Only        Medication List        Accurate as of September 05, 2022 12:04 PM. If you have any questions, ask your nurse or doctor.          acetaminophen 325 MG tablet Commonly known as: TYLENOL Take 325-650 mg by mouth every 6 (six) hours as needed (for pain).   ALPRAZolam 0.25 MG tablet Commonly known as: XANAX Take 0.25 mg by mouth 2 (two) times daily as needed.   azelastine 0.1 % nasal spray Commonly known as: ASTELIN Place into both nostrils 2 (two) times daily. Use in each nostril as directed   b complex vitamins capsule daily.   cetirizine 10 MG tablet Commonly known as: ZYRTEC Take 1 tablet (10 mg total) by mouth daily as needed for allergies (Can take an extra dose during flare ups.).   Cholecalciferol 25 MCG (1000 UT) capsule Take 1,000 Units by mouth daily.   clotrimazole-betamethasone cream Commonly known as: Lotrisone Apply 1 Application topically 2 (two) times daily. Use for 2 weeks.   cyclobenzaprine 10 MG tablet Commonly  known as: FLEXERIL Take 2.5 mg by mouth daily as needed for muscle spasms.   dextromethorphan-guaiFENesin 30-600 MG 12hr tablet Commonly known as: MUCINEX DM Take 1 tablet by mouth 2 (two) times daily.   diclofenac 0.1 % ophthalmic solution Commonly known as: VOLTAREN 4 (four) times daily as needed.   Eliquis 5 MG Tabs tablet Generic drug: apixaban TAKE 1 TABLET TWICE DAILY   EPINEPHrine 0.3 mg/0.3 mL Soaj injection Commonly known as: EPI-PEN Inject 0.3 mg into the muscle as needed for anaphylaxis.   fluorometholone 0.1 % ophthalmic suspension Commonly known as: FML Place 1 drop into both eyes every Monday,  Wednesday, and Friday.   fluticasone 50 MCG/ACT nasal spray Commonly known as: FLONASE Place 1 spray into both nostrils 2 (two) times daily.   folic acid 678 MCG tablet Commonly known as: FOLVITE daily.   freestyle lancets Check blood sugar once daily as directed DX:790.29 (FREESTYLE FREEDOM LITE)   glipiZIDE 10 MG 24 hr tablet Commonly known as: GLUCOTROL XL Take 10 mg by mouth daily.   glucose blood test strip Inject 1 strip into the skin daily.   glucose blood test strip Commonly known as: FREESTYLE LITE CHECK BLOOD SUGAR ONCE DAILY AS DIRECTED.  DX:790.29   ipratropium 0.06 % nasal spray Commonly known as: ATROVENT Place 2 sprays into both nostrils 3 (three) times daily.   lidocaine 5 % ointment Commonly known as: XYLOCAINE Apply 1 application topically 3 (three) times daily as needed.   loratadine 10 MG tablet Commonly known as: CLARITIN Take 1 tablet (10 mg total) by mouth daily.   metoprolol tartrate 50 MG tablet Commonly known as: LOPRESSOR Take 1.5 tablets (75 mg total) by mouth 2 (two) times daily.   montelukast 10 MG tablet Commonly known as: SINGULAIR Take 1 tablet (10 mg total) by mouth at bedtime.   multivitamin tablet Take 1 tablet by mouth daily. Shaklee brand   nitroGLYCERIN 0.4 MG SL tablet Commonly known as: NITROSTAT Place 1 tablet (0.4 mg total) under the tongue every 5 (five) minutes as needed for chest pain.   pantoprazole 40 MG tablet Commonly known as: PROTONIX Take 1 tablet (40 mg total) by mouth 2 (two) times daily.   rosuvastatin 5 MG tablet Commonly known as: CRESTOR Take 5 mg by mouth See admin instructions. Take 5 mg by mouth at bedtime on Mondays and Thursdays..Currently on hold   tretinoin 0.025 % cream Commonly known as: RETIN-A Apply 1 application. topically at bedtime.   trolamine salicylate 10 % cream Commonly known as: ASPERCREME Apply 1 application topically as needed for muscle pain.   valACYclovir 500 MG  tablet Commonly known as: VALTREX Take 1 tablet (500 mg total) by mouth 2 (two) times daily. Take twice a day for thee days as needed for an outbreak.   verapamil 180 MG CR tablet Commonly known as: CALAN-SR Take 180 mg by mouth at bedtime.        Allergies:  Allergies  Allergen Reactions   Codeine Hives and Other (See Comments)    Because of a history of documented adverse serious drug reaction, Medi Alert bracelet  is recommended   Macrodantin [Nitrofurantoin Macrocrystal] Other (See Comments)    Numbness, aching all over   Other Shortness Of Breath, Rash and Other (See Comments)    Pine nuts & walnuts throat congestion. No documented angioedema.    Propoxyphene Hives, Swelling, Rash and Other (See Comments)   Propoxyphene Hcl Hives, Swelling and Rash   Dulaglutide Other (See  Comments)    Other reaction(s): weak, ha, no appetite   Latex Rash and Other (See Comments)   Metformin Hcl Other (See Comments)    Other reaction(s): leg discomfort   Neosporin [Bacitracin-Polymyxin B] Other (See Comments)    Blisters   Zoster Vac Recomb Adjuvanted Rash and Other (See Comments)    Other reaction(s): confusion   Avocado Rash   Banana Rash   Ciprofloxacin Nausea Only and Other (See Comments)    Nausea (Note: Patient takes this when on mission trips, however)   Tizanidine Other (See Comments)    Reaction not recalled   Tramadol Nausea Only    Past Medical History, Surgical history, Social history, and Family History were reviewed and updated.  Review of Systems: All other 10 point review of systems is negative.   Physical Exam:  vitals were not taken for this visit.   Wt Readings from Last 3 Encounters:  07/20/22 169 lb (76.7 kg)  07/07/22 167 lb (75.8 kg)  05/30/22 168 lb (76.2 kg)    Ocular: Sclerae unicteric, pupils equal, round and reactive to light Ear-nose-throat: Oropharynx clear, dentition fair Lymphatic: No cervical or supraclavicular adenopathy Lungs no rales  or rhonchi, good excursion bilaterally Heart regular rate and rhythm, no murmur appreciated Abd soft, nontender, positive bowel sounds MSK no focal spinal tenderness, no joint edema Neuro: non-focal, well-oriented, appropriate affect Breasts: Deferred   Lab Results  Component Value Date   WBC 2.6 (L) 07/20/2022   HGB 11.4 (L) 07/20/2022   HCT 36.1 07/20/2022   MCV 87.0 07/20/2022   PLT 186 07/20/2022   Lab Results  Component Value Date   FERRITIN 606 (H) 07/20/2022   IRON 23 (L) 07/20/2022   TIBC 251 07/20/2022   UIBC 228 07/20/2022   IRONPCTSAT 9 (L) 07/20/2022   Lab Results  Component Value Date   RETICCTPCT 0.8 07/20/2022   RBC 4.04 07/20/2022   No results found for: "KPAFRELGTCHN", "LAMBDASER", "KAPLAMBRATIO" No results found for: "IGGSERUM", "IGA", "IGMSERUM" No results found for: "TOTALPROTELP", "ALBUMINELP", "A1GS", "A2GS", "BETS", "BETA2SER", "GAMS", "MSPIKE", "SPEI"   Chemistry      Component Value Date/Time   NA 136 07/20/2022 0958   NA 138 03/10/2020 1138   K 4.1 07/20/2022 0958   CL 100 07/20/2022 0958   CO2 27 07/20/2022 0958   BUN 13 07/20/2022 0958   BUN 10 03/10/2020 1138   CREATININE 0.70 07/20/2022 0958      Component Value Date/Time   CALCIUM 10.3 07/20/2022 0958   ALKPHOS 78 07/20/2022 0958   AST 12 (L) 07/20/2022 0958   ALT 10 07/20/2022 0958   BILITOT 0.4 07/20/2022 0958       Impression and Plan: Ms. Glomb is a very pleasant 76 yo African American female with low-grade MDS, low IPSS-R score.  Her WBC count is still down. We will give her Neulasta again today.  No ESA needed, Hgb 11.6.  Iron studies are pending. We will replace if needed.  We will continue to follow along closely and see her back in 6 weeks.   Lottie Dawson, NP 11/7/202312:04 PM

## 2022-09-05 NOTE — Patient Instructions (Signed)

## 2022-09-06 DIAGNOSIS — E7800 Pure hypercholesterolemia, unspecified: Secondary | ICD-10-CM | POA: Diagnosis not present

## 2022-09-06 DIAGNOSIS — E1165 Type 2 diabetes mellitus with hyperglycemia: Secondary | ICD-10-CM | POA: Diagnosis not present

## 2022-09-06 DIAGNOSIS — K219 Gastro-esophageal reflux disease without esophagitis: Secondary | ICD-10-CM | POA: Diagnosis not present

## 2022-09-08 ENCOUNTER — Other Ambulatory Visit: Payer: Self-pay | Admitting: Cardiovascular Disease

## 2022-09-08 ENCOUNTER — Telehealth: Payer: Self-pay | Admitting: *Deleted

## 2022-09-08 NOTE — Telephone Encounter (Signed)
Received a call from patient stating that after her last Udenyca shot, she was experiencing itching all over her body for about 24 hours.  Researched Micromedex and pruritus is a very rare side effect of this medication.  Discussed with patient that with her history of allergies that it is always possible that she could be allergic.  She could take Benadryl or Claritin prior to next injection.  Patient appreciative of information

## 2022-09-25 DIAGNOSIS — M19012 Primary osteoarthritis, left shoulder: Secondary | ICD-10-CM | POA: Diagnosis not present

## 2022-09-25 DIAGNOSIS — M25512 Pain in left shoulder: Secondary | ICD-10-CM | POA: Diagnosis not present

## 2022-09-25 DIAGNOSIS — M47816 Spondylosis without myelopathy or radiculopathy, lumbar region: Secondary | ICD-10-CM | POA: Diagnosis not present

## 2022-10-03 NOTE — Progress Notes (Unsigned)
Cardiology Office Note:    Date:  10/04/2022   ID:  Kristen Murray, DOB Feb 23, 1946, MRN [id number]  PCP:  Kristen Murray, [facility] Cardiologist:  Mertie Moores, MD Cardiology APP:  Sh[facility]     Referring MD: Kristen Orn, MD   Chief Complaint:  F/u for AFib    Patient Profile: Atrial fibrillation/flutter CHA2DS2-VASc=4 (female, HTN, Diab, age x 1)  Admitted 12/2019; Apixaban started; conv to NSR w/ Dilt TTE 01/28/20: EF 60-65, no RWMA, moderate LVH, normal RV SF, trivial MR, trivial AI  Coronary artery disease  Myoview 01/30/20: EF 76, no ischemia or infarction, low risk  CCTA 01/25/21: non-obs dz; mRCA 0-24, CAC score 1 (38%) Diabetes mellitus Hyperlipidemia (managed by PCP) Hypertension Aortic atherosclerosis  PVCs Monitor 01/2021: NSR, occ PVCs, no sig arrhythmias  GERD Iron deficiency anemia Myelodysplasia  Migraine HAs Verapamil; followed by Neuro at [name] OSA   History of Present Illness:   Kristen Murray is a 76 y.o. female with the above problem list.  She was last seen by Kristen Murray 11/22/21. She returns for f/u.  She is alone.  She does note shortness of breath with certain activities.  She was able to travel to [place] this past summer.  She did not have significant difficulty there.  However, she does get short of breath while she is talking at times. She has some lower L chest pain from time to time. She has not had exertional substernal chest pain. She has not had orthopnea, leg edema, syncope.      EKG:  not done     Reviewed and updated this encounter:  Tobacco  Allergies  Meds  Problems  Med Hx  Surg Hx  Fam Hx      Review of Systems  Gastrointestinal:  Negative for hematochezia and melena.  Genitourinary:  Negative for hematuria.     Labs/Other Test Reviewed:    Recent Labs: 09/05/2022: ALT 9; BUN 18; Creatinine 0.69; Hemoglobin 11.6; Platelet Count 171; Potassium 4.4; Sodium 137   Recent Lipid  Panel No results for input(s): "CHOL", "TRIG", "HDL", "VLDL", "LDLCALC", "LDLDIRECT" in the last 8760 hours.    Risk Assessment/Calculations/Metrics:    CHA2DS2-VASc Score = 6   This indicates a 9.7% annual risk of stroke. The patient's score is based upon: CHF History: 0 HTN History: 1 Diabetes History: 1 Stroke History: 0 Vascular Disease History: 1 Age Score: 2 Gender Score: 1             Physical Exam:    VS:  BP 130/60 (BP Location: Left Arm, Patient Position: Sitting, Cuff Size: Normal)   Pulse 82   Ht '5\' 4"'$  (1.626 m)   Wt 169 lb (76.7 kg)   LMP 07/30/1990   SpO2 95%   BMI 29.01 kg/m     Wt Readings from Last 3 Encounters:  10/04/22 169 lb (76.7 kg)  09/05/22 167 lb 12.8 oz (76.1 kg)  07/20/22 169 lb (76.7 kg)    Constitutional:      Appearance: Healthy appearance. Not in distress.  Neck:     Vascular: JVD normal.  Pulmonary:     Effort: Pulmonary effort is normal.     Breath sounds: No wheezing. No rales.  Cardiovascular:     Normal rate. Regular rhythm. Normal S1. Normal S2.      Murmurs: There is no murmur.  Edema:    Peripheral edema absent.  Abdominal:  Palpations: Abdomen is soft.  Skin:    General: Skin is warm and dry.  Neurological:     Mental Status: Alert and oriented to person, place and time.         ASSESSMENT & PLAN:   Atrial flutter, paroxysmal ([place]) She is maintaining NSR on exam. She has not had significant bleeding issues with Eliquis. 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 Marin Olp with hematology. She had a recent Hgb that was low normal. A recent Creatinine was normal. She is having difficulty affording Eliquis and would like to avoid Coumadin. I have provided her with the name of Xarelto.  If she discovers that it is much cheaper, she will contact me and we can change her medications.  For now, continue Eliquis 5 mg twice daily, metoprolol tartrate 75 mg twice daily, verapamil 180 mg daily.  Follow-up in  6 months.  Shortness of breath She notes shortness of breath with certain activities.  She has also noted shortness of breath with talking.  She does have a lot of sinus drainage and has a chronic cough.  I suspect that this may be related.  Obtain BNP today.  Schedule echocardiogram.  If findings are unremarkable, follow-up with primary care for further evaluation.  CAD (coronary artery disease) Minimal nonobstructive disease by coronary CTA in March 2022.  She does have occasional chest pain.  This sounds noncardiac.  No further ischemic testing is needed.  Continue Crestor 5 mg twice weekly.  Essential hypertension The patient's blood pressure is controlled on her current regimen.  Continue current therapy with verapamil 180 mg daily, metoprolol tartrate 75 mg twice daily..            Dispo:  Return in about 6 months (around 04/05/2023) for Routine Follow Up, w/ Kristen Murray.   Medication Adjustments/Labs and Tests Ordered: Current medicines are reviewed at length with the patient today.  Concerns regarding medicines are outlined above.  Tests Ordered: Orders Placed This Encounter  Procedures   Pro b natriuretic peptide (BNP)   ECHOCARDIOGRAM COMPLETE   Medication Changes: No orders of the defined types were placed in this encounter.  Signed, Kristen Dopp, PA-C  10/04/2022 11:29 AM    [facility] [address], [place], [place]  [address] Phone: [phone]; Fax: [phone]

## 2022-10-04 ENCOUNTER — Encounter: Payer: Self-pay | Admitting: Physician Assistant

## 2022-10-04 ENCOUNTER — Ambulatory Visit: Payer: Medicare HMO | Attending: Physician Assistant | Admitting: Physician Assistant

## 2022-10-04 VITALS — BP 130/60 | HR 82 | Ht 64.0 in | Wt 169.0 lb

## 2022-10-04 DIAGNOSIS — I2510 Atherosclerotic heart disease of native coronary artery without angina pectoris: Secondary | ICD-10-CM

## 2022-10-04 DIAGNOSIS — I4892 Unspecified atrial flutter: Secondary | ICD-10-CM | POA: Diagnosis not present

## 2022-10-04 DIAGNOSIS — I10 Essential (primary) hypertension: Secondary | ICD-10-CM | POA: Diagnosis not present

## 2022-10-04 DIAGNOSIS — R0602 Shortness of breath: Secondary | ICD-10-CM

## 2022-10-04 NOTE — Assessment & Plan Note (Signed)
She notes shortness of breath with certain activities.  She has also noted shortness of breath with talking.  She does have a lot of sinus drainage and has a chronic cough.  I suspect that this may be related.  Obtain BNP today.  Schedule echocardiogram.  If findings are unremarkable, follow-up with primary care for further evaluation.

## 2022-10-04 NOTE — Assessment & Plan Note (Signed)
Minimal nonobstructive disease by coronary CTA in March 2022.  She does have occasional chest pain.  This sounds noncardiac.  No further ischemic testing is needed.  Continue Crestor 5 mg twice weekly.

## 2022-10-04 NOTE — Assessment & Plan Note (Addendum)
She is maintaining NSR on exam. She has not had significant bleeding issues with Eliquis. 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 Marin Olp with hematology. She had a recent Hgb that was low normal. A recent Creatinine was normal. She is having difficulty affording Eliquis and would like to avoid Coumadin. I have provided her with the name of Xarelto.  If she discovers that it is much cheaper, she will contact me and we can change her medications.  For now, continue Eliquis 5 mg twice daily, metoprolol tartrate 75 mg twice daily, verapamil 180 mg daily.  Follow-up in 6 months.

## 2022-10-04 NOTE — Patient Instructions (Signed)
Medication Instructions:  Your physician recommends that you continue on your current medications as directed. Please refer to the Current Medication list given to you today.  *If you need a refill on your cardiac medications before your next appointment, please call your pharmacy*   Lab Work: TODAY: BNP If you have labs (blood work) drawn today and your tests are completely normal, you will receive your results only by: [place] (if you have MyChart) OR A paper copy in the mail If you have any lab test that is abnormal or we need to change your treatment, we will call you to review the results.   Testing/Procedures: Your physician has requested that you have an echocardiogram. Echocardiography is a painless test that uses sound waves to create images of your heart. It provides your doctor with information about the size and shape of your heart and how well your heart's chambers and valves are working. This procedure takes approximately one hour. There are no restrictions for this procedure. Please do NOT wear cologne, perfume, aftershave, or lotions (deodorant is allowed). Please arrive 15 minutes prior to your appointment time.   Follow-Up: At [facility], you and your health needs are our priority.  As part of our continuing mission to provide you with exceptional heart care, we have created designated Provider Care Teams.  These Care Teams include your primary Cardiologist (physician) and Advanced Practice Providers (APPs -  Physician Assistants and Nurse Practitioners) who all work together to provide you with the care you need, when you need it.  We recommend signing up for the patient portal called "MyChart".  Sign up information is provided on this After Visit Summary.  MyChart is used to connect with patients for Virtual Visits (Telemedicine).  Patients are able to view lab/test results, encounter notes, upcoming appointments, etc.  Non-urgent messages can be sent to  your provider as well.   To learn more about what you can do with MyChart, go to [email or web address].    Your next appointment:   6 month(s)  The format for your next appointment:   In Person  Provider:   Mertie Moores, MD   Other Instructions You can check with your pharmacy regarding the cost for Xarelto 20 mg tablets and let us know if it is a cheaper option for you  Important Information About Sugar

## 2022-10-04 NOTE — Assessment & Plan Note (Signed)
The patient's blood pressure is controlled on her current regimen.  Continue current therapy with verapamil 180 mg daily, metoprolol tartrate 75 mg twice daily.Marland Kitchen

## 2022-10-05 LAB — PRO B NATRIURETIC PEPTIDE: NT-Pro BNP: 631 pg/mL (ref 0–738)

## 2022-10-09 DIAGNOSIS — H0100A Unspecified blepharitis right eye, upper and lower eyelids: Secondary | ICD-10-CM | POA: Diagnosis not present

## 2022-10-09 DIAGNOSIS — H2513 Age-related nuclear cataract, bilateral: Secondary | ICD-10-CM | POA: Diagnosis not present

## 2022-10-09 DIAGNOSIS — B301 Conjunctivitis due to adenovirus: Secondary | ICD-10-CM | POA: Diagnosis not present

## 2022-10-09 DIAGNOSIS — H04123 Dry eye syndrome of bilateral lacrimal glands: Secondary | ICD-10-CM | POA: Diagnosis not present

## 2022-10-09 DIAGNOSIS — Z8619 Personal history of other infectious and parasitic diseases: Secondary | ICD-10-CM | POA: Diagnosis not present

## 2022-10-09 DIAGNOSIS — H11233 Symblepharon, bilateral: Secondary | ICD-10-CM | POA: Diagnosis not present

## 2022-10-09 DIAGNOSIS — H0100B Unspecified blepharitis left eye, upper and lower eyelids: Secondary | ICD-10-CM | POA: Diagnosis not present

## 2022-10-09 DIAGNOSIS — H0288A Meibomian gland dysfunction right eye, upper and lower eyelids: Secondary | ICD-10-CM | POA: Diagnosis not present

## 2022-10-09 DIAGNOSIS — Z8669 Personal history of other diseases of the nervous system and sense organs: Secondary | ICD-10-CM | POA: Diagnosis not present

## 2022-10-09 DIAGNOSIS — H0288B Meibomian gland dysfunction left eye, upper and lower eyelids: Secondary | ICD-10-CM | POA: Diagnosis not present

## 2022-10-09 DIAGNOSIS — Z79899 Other long term (current) drug therapy: Secondary | ICD-10-CM | POA: Diagnosis not present

## 2022-10-09 NOTE — Progress Notes (Signed)
Pt has been made aware of normal result and verbalized understanding.  jw

## 2022-10-12 DIAGNOSIS — M25512 Pain in left shoulder: Secondary | ICD-10-CM | POA: Diagnosis not present

## 2022-10-16 IMAGING — CR DG LUMBAR SPINE 2-3V
3 series · 3 of 3 positions shown · non-contrast
Comparison: CT abdomen and pelvis 05/25/2021

CLINICAL DATA: Low back pain

EXAM:
LUMBAR SPINE - 2-3 VIEW

[t l-spine a.p.]
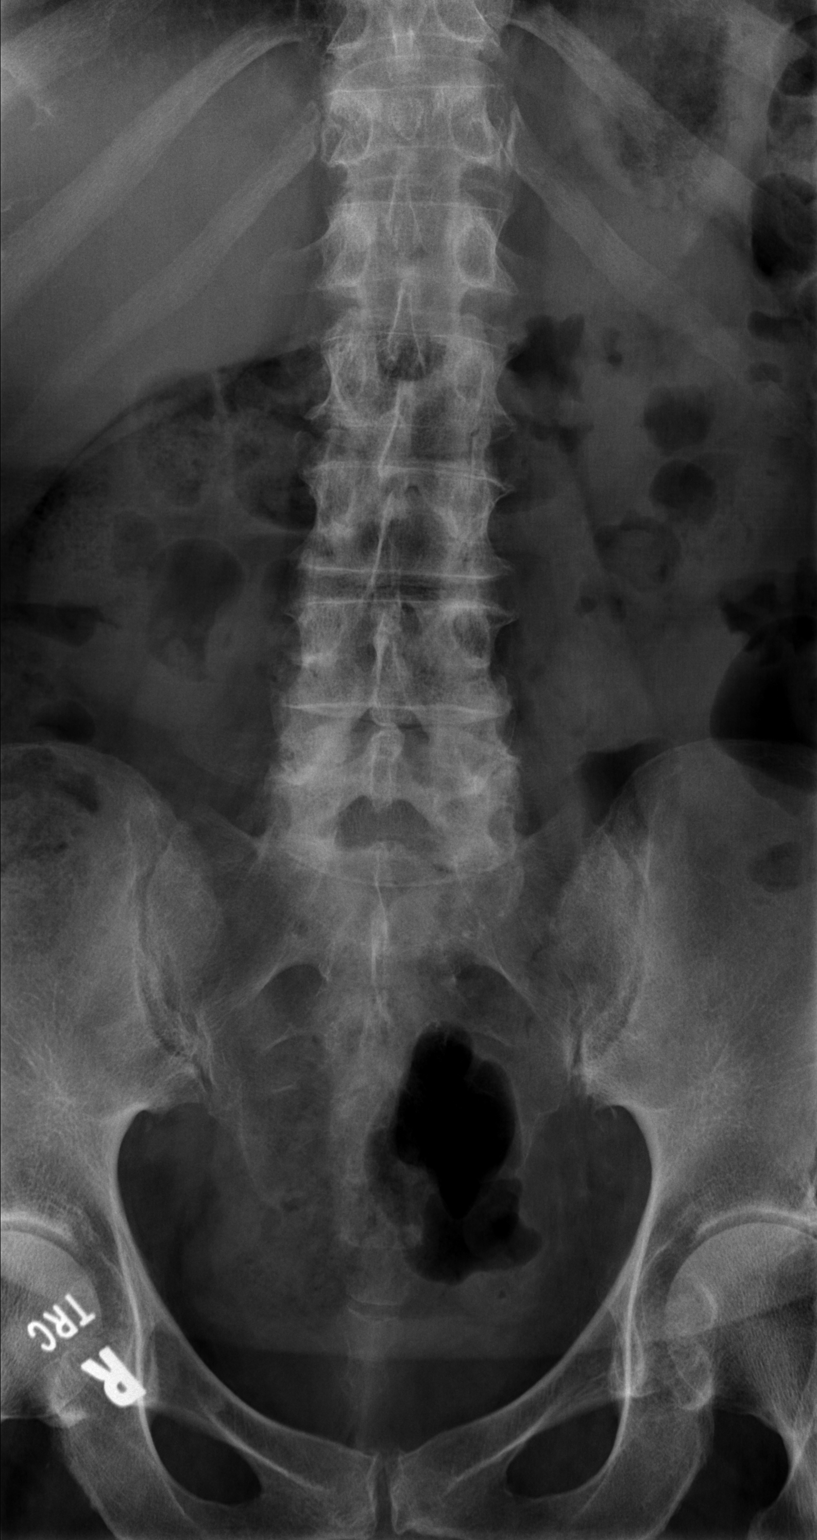

[t l-spine lat]
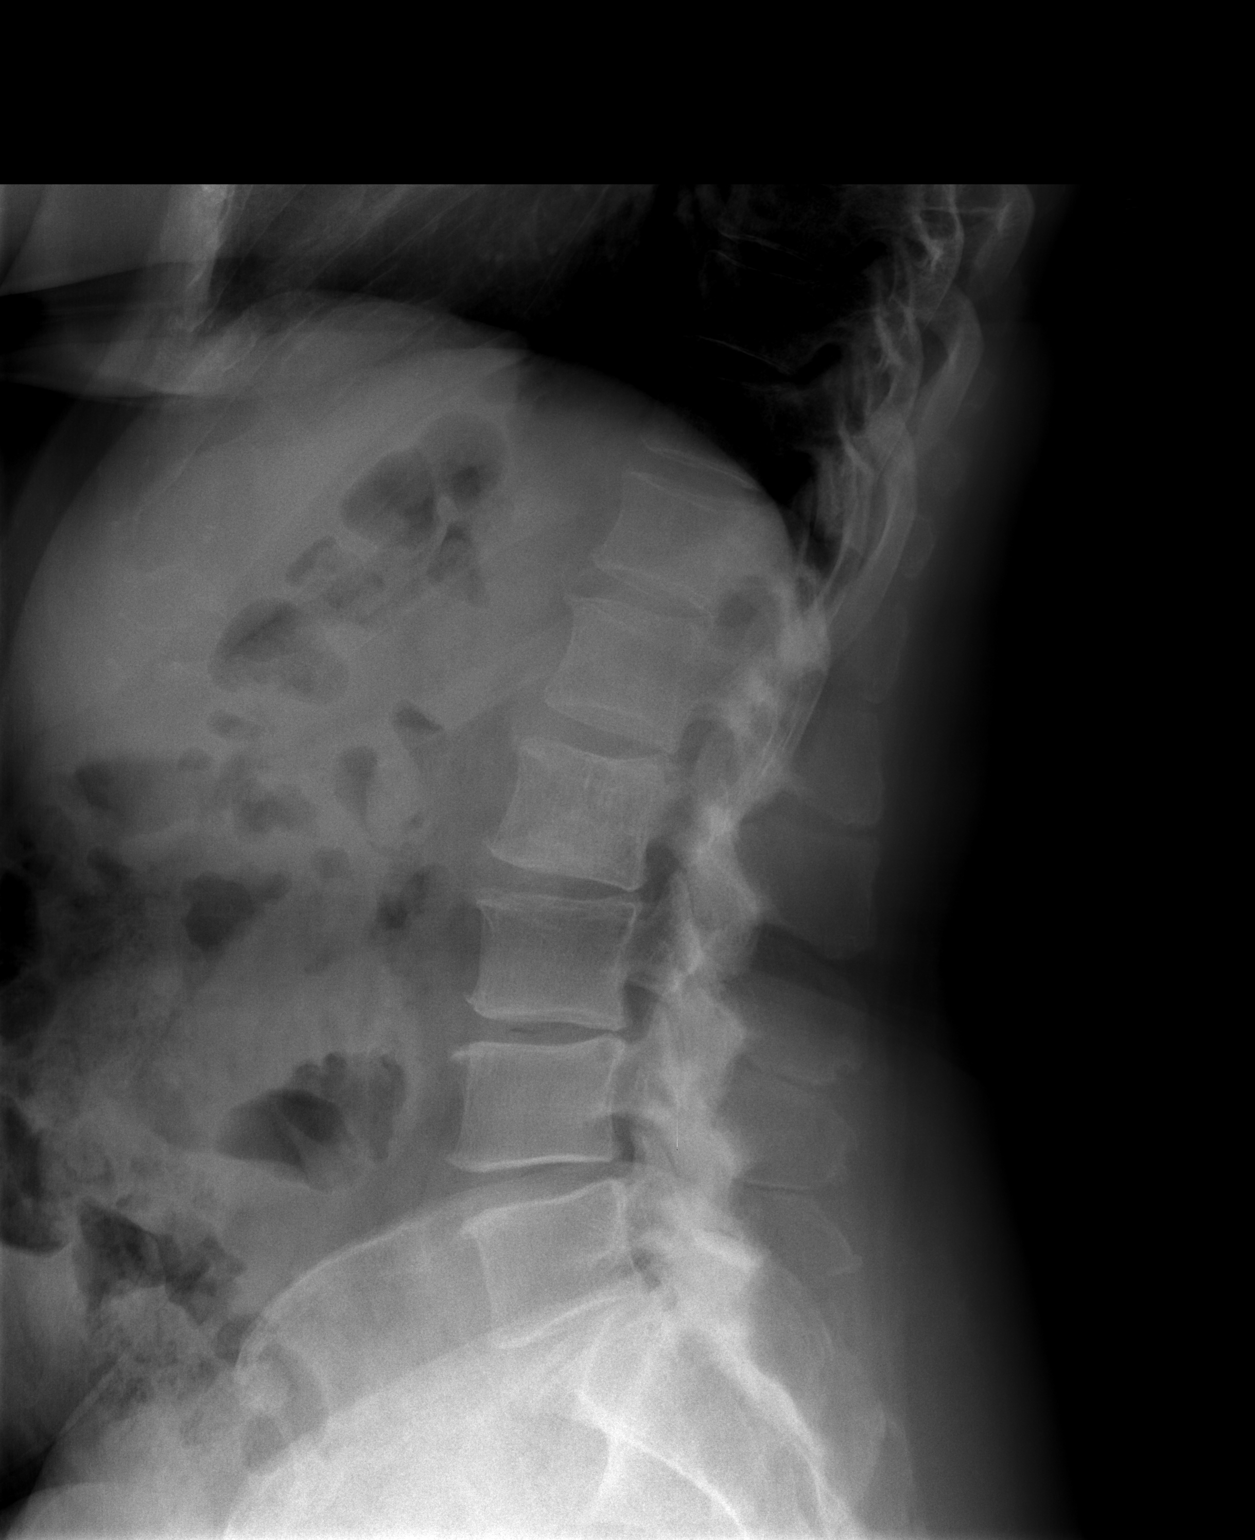

[t l-spine l5-s1 spot]
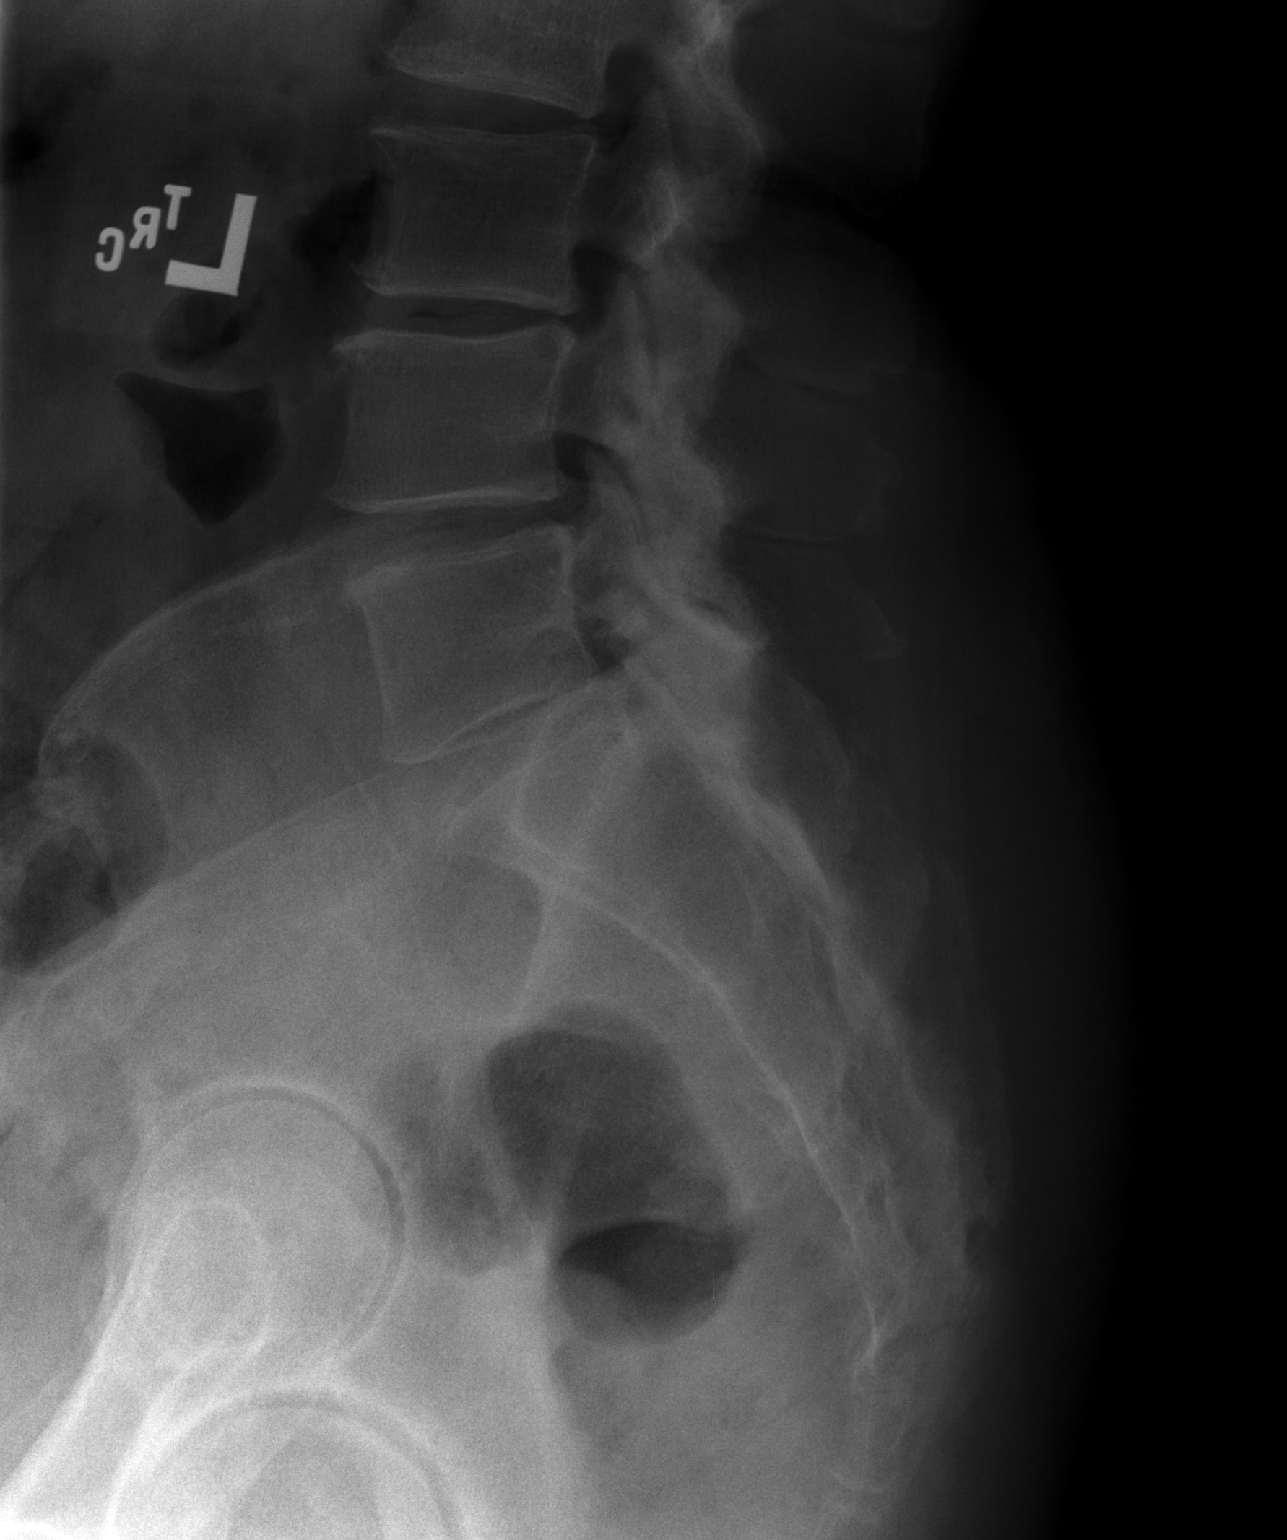

[3 of 3 positions shown; findings below may reference images not displayed]

FINDINGS: Lumbar vertebral body height and alignment are preserved without
fracture or spondylolisthesis. Vertebral body hemangioma noted at
L2. Mild intervertebral disc space narrowing throughout the lumbar
spine. Facet arthropathy.
IMPRESSION: Degenerative changes of the lumbar spine with no fracture
identified.

## 2022-10-16 IMAGING — CR DG PELVIS 1-2V
1 series · 1 of 1 positions shown · non-contrast
Comparison: CT of the abdomen and pelvis on 05/25/2021

CLINICAL DATA: LOWER back pain, unspecified. Symptoms for 2 weeks.
No known injury.

EXAM:
PELVIS - 1-2 VIEW

[t pelvis a.p.]
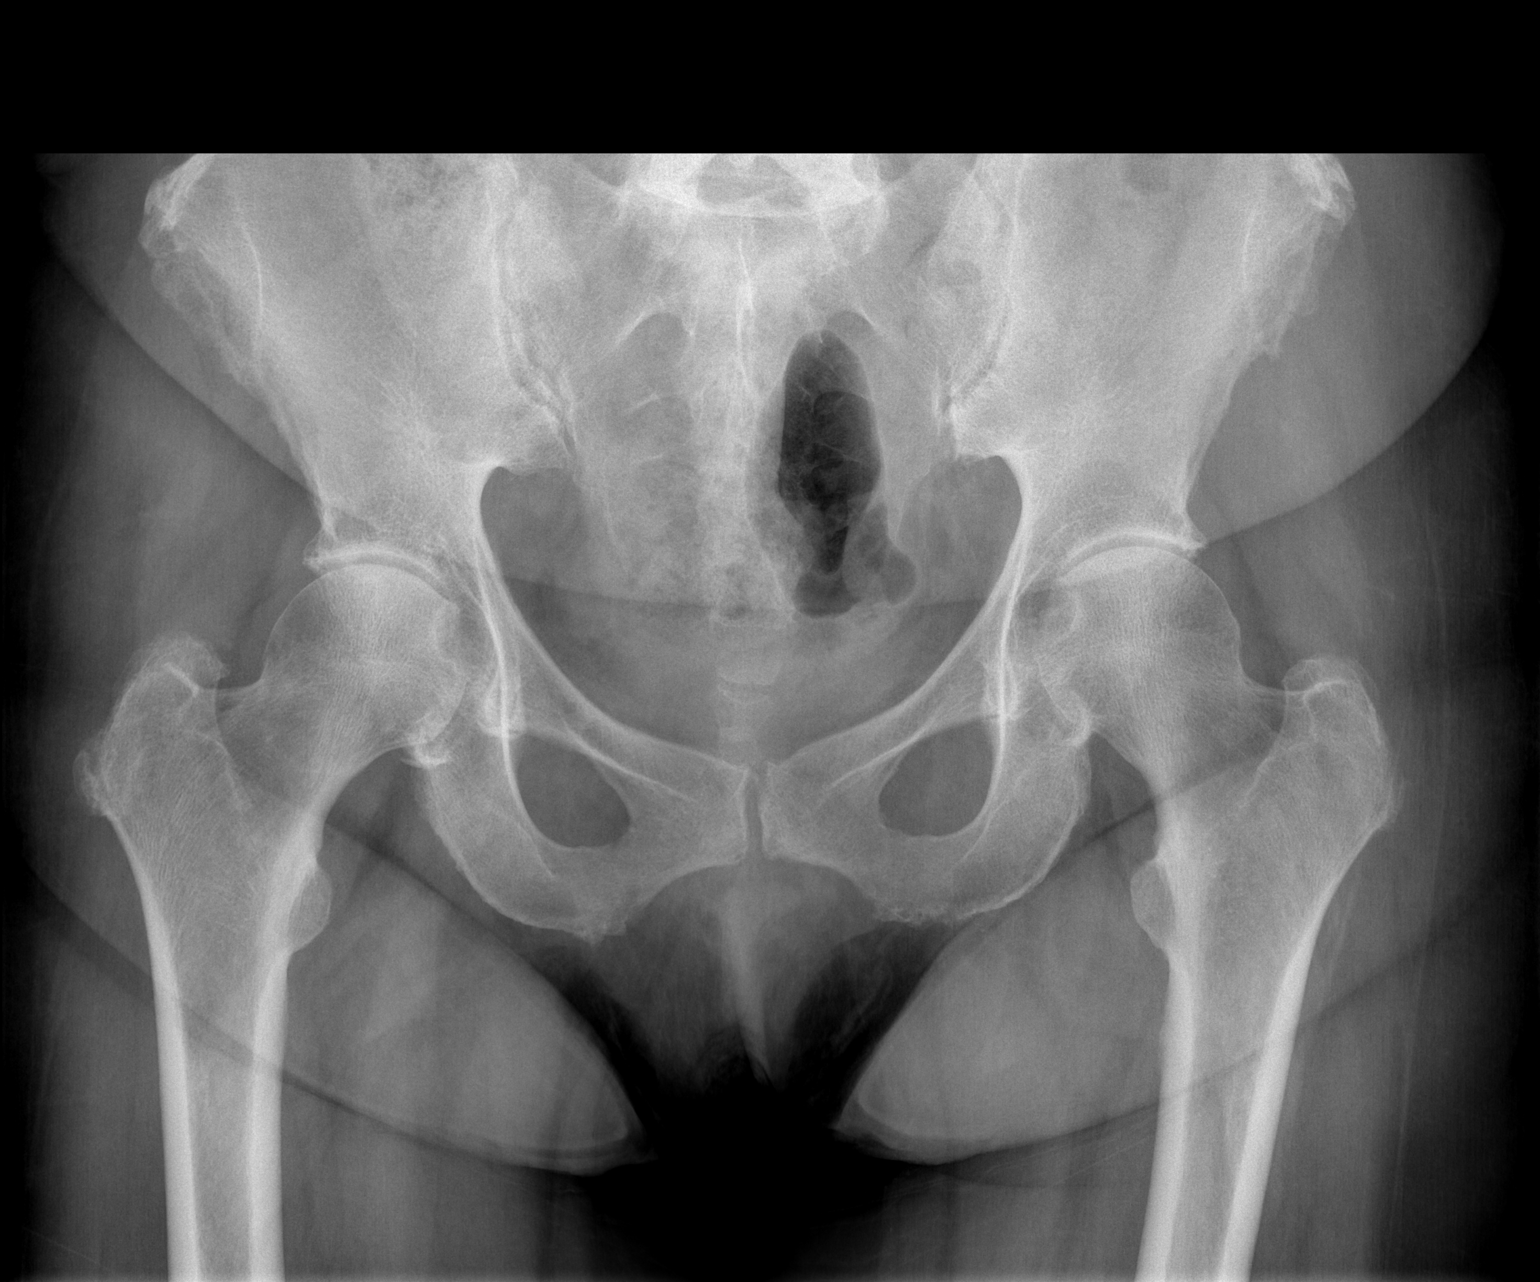

[1 of 1 positions shown; findings below may reference images not displayed]

FINDINGS: There is no evidence of pelvic fracture or diastasis. No pelvic bone
lesions are seen.
IMPRESSION: Negative.

## 2022-10-17 ENCOUNTER — Inpatient Hospital Stay: Payer: Medicare HMO | Admitting: Family

## 2022-10-17 ENCOUNTER — Inpatient Hospital Stay: Payer: Medicare HMO | Attending: Hematology & Oncology

## 2022-10-17 ENCOUNTER — Encounter: Payer: Self-pay | Admitting: Family

## 2022-10-17 ENCOUNTER — Inpatient Hospital Stay: Payer: Medicare HMO

## 2022-10-17 VITALS — BP 133/66 | HR 64 | Temp 98.2°F | Resp 17 | Wt 170.0 lb

## 2022-10-17 DIAGNOSIS — D46Z Other myelodysplastic syndromes: Secondary | ICD-10-CM

## 2022-10-17 DIAGNOSIS — D72819 Decreased white blood cell count, unspecified: Secondary | ICD-10-CM

## 2022-10-17 DIAGNOSIS — D469 Myelodysplastic syndrome, unspecified: Secondary | ICD-10-CM | POA: Diagnosis not present

## 2022-10-17 DIAGNOSIS — K909 Intestinal malabsorption, unspecified: Secondary | ICD-10-CM | POA: Insufficient documentation

## 2022-10-17 DIAGNOSIS — D509 Iron deficiency anemia, unspecified: Secondary | ICD-10-CM | POA: Diagnosis not present

## 2022-10-17 DIAGNOSIS — D500 Iron deficiency anemia secondary to blood loss (chronic): Secondary | ICD-10-CM

## 2022-10-17 DIAGNOSIS — D708 Other neutropenia: Secondary | ICD-10-CM

## 2022-10-17 LAB — CMP (CANCER CENTER ONLY)
ALT: 16 U/L (ref 0–44)
AST: 17 U/L (ref 15–41)
Albumin: 4.1 g/dL (ref 3.5–5.0)
Alkaline Phosphatase: 73 U/L (ref 38–126)
Anion gap: 7 (ref 5–15)
BUN: 12 mg/dL (ref 8–23)
CO2: 30 mmol/L (ref 22–32)
Calcium: 9.9 mg/dL (ref 8.9–10.3)
Chloride: 103 mmol/L (ref 98–111)
Creatinine: 0.67 mg/dL (ref 0.44–1.00)
GFR, Estimated: >60 mL/min (ref >60)
Glucose, Bld: 215 mg/dL — ABNORMAL HIGH (ref 70–99)
Potassium: 4 mmol/L (ref 3.5–5.1)
Sodium: 140 mmol/L (ref 135–145)
Total Bilirubin: 0.4 mg/dL (ref 0.3–1.2)
Total Protein: 8 g/dL (ref 6.5–8.1)

## 2022-10-17 LAB — CBC WITH DIFFERENTIAL (CANCER CENTER ONLY)
Abs Immature Granulocytes: 0 10*3/uL (ref 0.00–0.07)
Basophils Absolute: 0 10*3/uL (ref 0.0–0.1)
Basophils Relative: 0 %
Eosinophils Absolute: 0 10*3/uL (ref 0.0–0.5)
Eosinophils Relative: 1 %
HCT: 34.8 % — ABNORMAL LOW (ref 36.0–46.0)
Hemoglobin: 11 g/dL — ABNORMAL LOW (ref 12.0–15.0)
Lymphocytes Relative: 69 %
Lymphs Abs: 1.4 10*3/uL (ref 0.7–4.0)
MCH: 27.3 pg (ref 26.0–34.0)
MCHC: 31.6 g/dL (ref 30.0–36.0)
MCV: 86.4 fL (ref 80.0–100.0)
Monocytes Absolute: 0.2 10*3/uL (ref 0.1–1.0)
Monocytes Relative: 8 %
Neutro Abs: 0.3 10*3/uL — CL (ref 1.7–7.7)
Neutrophils Relative %: 22 %
Platelet Count: 143 10*3/uL — ABNORMAL LOW (ref 150–400)
RBC: 4.03 MIL/uL (ref 3.87–5.11)
RDW: 18.6 % — ABNORMAL HIGH (ref 11.5–15.5)
WBC Count: 2 10*3/uL — ABNORMAL LOW (ref 4.0–10.5)
nRBC: 0 % (ref 0.0–0.2)

## 2022-10-17 LAB — IRON AND IRON BINDING CAPACITY (CC-WL,HP ONLY)
Iron: 29 ug/dL (ref 28–170)
Saturation Ratios: 12 % (ref 10.4–31.8)
TIBC: 244 ug/dL — ABNORMAL LOW (ref 250–450)
UIBC: 215 ug/dL (ref 148–442)

## 2022-10-17 LAB — FERRITIN: Ferritin: 523 ng/mL — ABNORMAL HIGH (ref 11–307)

## 2022-10-17 LAB — LACTATE DEHYDROGENASE: LDH: 171 U/L (ref 98–192)

## 2022-10-17 LAB — SAVE SMEAR(SSMR), FOR PROVIDER SLIDE REVIEW

## 2022-10-17 MED ORDER — PEGFILGRASTIM-CBQV 6 MG/0.6ML ~~LOC~~ SOSY
6.0000 mg | PREFILLED_SYRINGE | Freq: Once | SUBCUTANEOUS | Status: AC
  Administered 2022-10-17: 6 mg via SUBCUTANEOUS
  Filled 2022-10-17: qty 0.6

## 2022-10-17 NOTE — Patient Instructions (Signed)

## 2022-10-17 NOTE — Progress Notes (Signed)
Critical lab result received of ANC 0.3 from lab. Dr. Marin Olp and Lottie Dawson NP aware and pt will receive Neupogen.

## 2022-10-17 NOTE — Progress Notes (Signed)
Hematology and Oncology Follow Up Visit  LEALER MARSLAND [id number] 26-Feb-1946 76 y.o. 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 is here today for follow-up. She is doing well but notes occasional fatigue.  She has not had any issue with infections. No fever, chills, n/v, cough, rash, dizziness, SOB, chest pain, palpitations, abdominal pain or changes in bowel or bladder habits.  WBC count is 2.0, ANC 0.3. She is scheduled for EGD and colonoscopy with Dr. Paulita Fujita on 11/02/2022. We are working to get her WBC count up prior to her procedure.  She has not noted any blood loss. No bruising or petechiae.  No swelling, tenderness, numbness or tingling in her extremities.  No falls or syncope reported.  She states that her appetite comes and goes. She is doing her best to stay well hydrated. Her weight is stable at 170 lbs.   ECOG Performance Status: 1 - Symptomatic but completely ambulatory  Medications:  Allergies as of 10/17/2022       Reactions   Codeine Hives, Other (See Comments)   Because of a history of documented adverse serious drug reaction, Medi Alert bracelet  is recommended   Macrodantin [nitrofurantoin Macrocrystal] Other (See Comments)   Numbness, aching all over   Other Shortness Of Breath, Rash, Other (See Comments)   Pine nuts & walnuts throat congestion. No documented angioedema.   Propoxyphene Hives, Swelling, Rash, Other (See Comments)   Propoxyphene Hcl Hives, Swelling, Rash   Dulaglutide Other (See Comments)   Other reaction(s): weak, ha, no appetite   Latex Rash, Other (See Comments)   Metformin Hcl Other (See Comments)   Other reaction(s): leg discomfort   Neosporin [bacitracin-polymyxin B] Other (See  Comments)   Blisters   Zoster Vac Recomb Adjuvanted Rash, Other (See Comments)   Other reaction(s): confusion   Avocado Rash   Banana Rash   Ciprofloxacin Nausea Only, Other (See Comments)   Nausea (Note: Patient takes this when on mission trips, however)   Tizanidine Other (See Comments)   Reaction not recalled   Tramadol Nausea Only        Medication List        Accurate as of October 17, 2022 11:17 AM. If you have any questions, ask your nurse or doctor.          acetaminophen 325 MG tablet Commonly known as: TYLENOL Take 325-650 mg by mouth every 6 (six) hours as needed (for pain).   ALPRAZolam 0.25 MG tablet Commonly known as: XANAX Take 0.25 mg by mouth 2 (two) times daily as needed.   azelastine 0.1 % nasal spray Commonly known as: ASTELIN Place into both nostrils 2 (two) times daily. Use in each nostril as directed   b complex vitamins capsule daily.   cetirizine 10 MG tablet Commonly known as: ZYRTEC Take 1 tablet (10 mg total) by mouth daily as needed for allergies (Can take an extra dose during flare ups.).   Cholecalciferol 25 MCG (1000 UT) capsule Take 1,000 Units by mouth daily.   clotrimazole-betamethasone cream Commonly known as: Lotrisone Apply 1 Application topically 2 (two) times daily. Use for 2 weeks.   cyclobenzaprine 10 MG tablet Commonly  known as: FLEXERIL Take 2.5 mg by mouth daily as needed for muscle spasms.   dextromethorphan-guaiFENesin 30-600 MG 12hr tablet Commonly known as: MUCINEX DM Take 1 tablet by mouth 2 (two) times daily.   diclofenac 0.1 % ophthalmic solution Commonly known as: VOLTAREN 4 (four) times daily as needed.   Eliquis 5 MG Tabs tablet Generic drug: apixaban TAKE 1 TABLET TWICE DAILY   EPINEPHrine 0.3 mg/0.3 mL Soaj injection Commonly known as: EPI-PEN Inject 0.3 mg into the muscle as needed for anaphylaxis.   fluorometholone 0.1 % ophthalmic suspension Commonly known as: FML Place 1 drop into  both eyes every Monday, Wednesday, and Friday.   fluticasone 50 MCG/ACT nasal spray Commonly known as: FLONASE Place 1 spray into both nostrils 2 (two) times daily.   folic acid 854 MCG tablet Commonly known as: FOLVITE daily.   freestyle lancets Check blood sugar once daily as directed DX:790.29 (FREESTYLE FREEDOM LITE)   glipiZIDE 10 MG 24 hr tablet Commonly known as: GLUCOTROL XL Take 10 mg by mouth daily.   glucose blood test strip Inject 1 strip into the skin daily.   glucose blood test strip Commonly known as: FREESTYLE LITE CHECK BLOOD SUGAR ONCE DAILY AS DIRECTED.  DX:790.29   ipratropium 0.06 % nasal spray Commonly known as: ATROVENT Place 2 sprays into both nostrils 3 (three) times daily.   lidocaine 5 % ointment Commonly known as: XYLOCAINE Apply 1 application topically 3 (three) times daily as needed.   loratadine 10 MG tablet Commonly known as: CLARITIN Take 1 tablet (10 mg total) by mouth daily.   metoprolol tartrate 50 MG tablet Commonly known as: LOPRESSOR TAKE 1 AND 1/2 TABLETS TWICE DAILY   montelukast 10 MG tablet Commonly known as: SINGULAIR Take 1 tablet (10 mg total) by mouth at bedtime.   multivitamin tablet Take 1 tablet by mouth daily. Shaklee brand   nitroGLYCERIN 0.4 MG SL tablet Commonly known as: NITROSTAT Place 1 tablet (0.4 mg total) under the tongue every 5 (five) minutes as needed for chest pain.   pantoprazole 40 MG tablet Commonly known as: PROTONIX Take 1 tablet (40 mg total) by mouth 2 (two) times daily.   rosuvastatin 5 MG tablet Commonly known as: CRESTOR Take 5 mg by mouth See admin instructions. Take 5 mg by mouth at bedtime on Mondays and Thursdays..Currently on hold   tretinoin 0.025 % cream Commonly known as: RETIN-A Apply 1 application. topically at bedtime.   trolamine salicylate 10 % cream Commonly known as: ASPERCREME Apply 1 application topically as needed for muscle pain.   valACYclovir 500 MG  tablet Commonly known as: VALTREX Take 1 tablet (500 mg total) by mouth 2 (two) times daily. Take twice a day for thee days as needed for an outbreak.   verapamil 180 MG CR tablet Commonly known as: CALAN-SR Take 180 mg by mouth at bedtime.        Allergies:  Allergies  Allergen Reactions   Codeine Hives and Other (See Comments)    Because of a history of documented adverse serious drug reaction, Medi Alert bracelet  is recommended   Macrodantin [Nitrofurantoin Macrocrystal] Other (See Comments)    Numbness, aching all over   Other Shortness Of Breath, Rash and Other (See Comments)    Pine nuts & walnuts throat congestion. No documented angioedema.    Propoxyphene Hives, Swelling, Rash and Other (See Comments)   Propoxyphene Hcl Hives, Swelling and Rash   Dulaglutide Other (See Comments)    Other  reaction(s): weak, ha, no appetite   Latex Rash and Other (See Comments)   Metformin Hcl Other (See Comments)    Other reaction(s): leg discomfort   Neosporin [Bacitracin-Polymyxin B] Other (See Comments)    Blisters   Zoster Vac Recomb Adjuvanted Rash and Other (See Comments)    Other reaction(s): confusion   Avocado Rash   Banana Rash   Ciprofloxacin Nausea Only and Other (See Comments)    Nausea (Note: Patient takes this when on mission trips, however)   Tizanidine Other (See Comments)    Reaction not recalled   Tramadol Nausea Only    Past Medical History, Surgical history, Social history, and Family History were reviewed and updated.  Review of Systems: All other 10 point review of systems is negative.   Physical Exam:  weight is 170 lb (77.1 kg). Her oral temperature is 98.2 F (36.8 C). Her blood pressure is 133/66 and her pulse is 64. Her respiration is 17 and oxygen saturation is 100%.   Wt Readings from Last 3 Encounters:  10/17/22 170 lb (77.1 kg)  10/04/22 169 lb (76.7 kg)  09/05/22 167 lb 12.8 oz (76.1 kg)    Ocular: Sclerae unicteric, pupils equal, round  and reactive to light Ear-nose-throat: Oropharynx clear, dentition fair Lymphatic: No cervical or supraclavicular adenopathy Lungs no rales or rhonchi, good excursion bilaterally Heart regular rate and rhythm, no murmur appreciated Abd soft, nontender, positive bowel sounds MSK no focal spinal tenderness, no joint edema Neuro: non-focal, well-oriented, appropriate affect Breasts: Deferred   Lab Results  Component Value Date   WBC 2.0 (L) 10/17/2022   HGB 11.0 (L) 10/17/2022   HCT 34.8 (L) 10/17/2022   MCV 86.4 10/17/2022   PLT 143 (L) 10/17/2022   Lab Results  Component Value Date   FERRITIN 582 (H) 09/05/2022   IRON 157 09/05/2022   TIBC 248 (L) 09/05/2022   UIBC 91 (L) 09/05/2022   IRONPCTSAT 63 (H) 09/05/2022   Lab Results  Component Value Date   RETICCTPCT 0.8 07/20/2022   RBC 4.03 10/17/2022   No results found for: "KPAFRELGTCHN", "LAMBDASER", "KAPLAMBRATIO" No results found for: "IGGSERUM", "IGA", "IGMSERUM" No results found for: "TOTALPROTELP", "ALBUMINELP", "A1GS", "A2GS", "BETS", "BETA2SER", "GAMS", "MSPIKE", "SPEI"   Chemistry      Component Value Date/Time   NA 140 10/17/2022 1019   NA 138 03/10/2020 1138   K 4.0 10/17/2022 1019   CL 103 10/17/2022 1019   CO2 30 10/17/2022 1019   BUN 12 10/17/2022 1019   BUN 10 03/10/2020 1138   CREATININE 0.67 10/17/2022 1019      Component Value Date/Time   CALCIUM 9.9 10/17/2022 1019   ALKPHOS 73 10/17/2022 1019   AST 17 10/17/2022 1019   ALT 16 10/17/2022 1019   BILITOT 0.4 10/17/2022 1019       Impression and Plan: Ms. Tibbits is a very pleasant 76 yo African American female with low-grade MDS, low IPSS-R score.  Her WBC count is down at 2.0. We will give her Neulasta again today.  No ESA needed, Hgb 11.0.  Iron studies are pending. We will replace if needed.  We will continue to follow along closely and see her back in 2 weeks (10/31/2022) for Neupogen prior to her procedure with Dr. Paulita Fujita.    Lottie Dawson,  NP 12/19/202311:17 AM

## 2022-10-19 DIAGNOSIS — M25512 Pain in left shoulder: Secondary | ICD-10-CM | POA: Diagnosis not present

## 2022-10-24 ENCOUNTER — Telehealth: Payer: Self-pay | Admitting: *Deleted

## 2022-10-24 NOTE — Telephone Encounter (Signed)
Call received from patient stating that she has had slight SOB over the past few days and would like to know if she should come in any earlier than her next scheduled appt on 10/31/22.  Vale [place] NP notified.  Call placed back to patient and patient notified per order of S. Eulas Post NP to keep appts scheduled for next week and to go to the ER with any new fevers or complaints.  Pt is appreciative of call and has no further questions at this time.

## 2022-10-25 ENCOUNTER — Ambulatory Visit
Admission: RE | Admit: 2022-10-25 | Discharge: 2022-10-25 | Disposition: A | Discharge status: Home / Self Care | Payer: Medicare HMO | Source: Ambulatory Visit | Attending: Physician Assistant | Admitting: Physician Assistant

## 2022-10-25 ENCOUNTER — Other Ambulatory Visit: Payer: Self-pay | Admitting: Physician Assistant

## 2022-10-25 DIAGNOSIS — Z7984 Long term (current) use of oral hypoglycemic drugs: Secondary | ICD-10-CM | POA: Diagnosis not present

## 2022-10-25 DIAGNOSIS — R059 Cough, unspecified: Secondary | ICD-10-CM | POA: Diagnosis not present

## 2022-10-25 DIAGNOSIS — R053 Chronic cough: Secondary | ICD-10-CM

## 2022-10-25 DIAGNOSIS — E0869 Diabetes mellitus due to underlying condition with other specified complication: Secondary | ICD-10-CM | POA: Diagnosis not present

## 2022-10-25 DIAGNOSIS — E119 Type 2 diabetes mellitus without complications: Secondary | ICD-10-CM | POA: Diagnosis not present

## 2022-10-25 DIAGNOSIS — M7918 Myalgia, other site: Secondary | ICD-10-CM | POA: Diagnosis not present

## 2022-10-27 DIAGNOSIS — D72819 Decreased white blood cell count, unspecified: Secondary | ICD-10-CM | POA: Diagnosis not present

## 2022-10-27 DIAGNOSIS — R300 Dysuria: Secondary | ICD-10-CM | POA: Diagnosis not present

## 2022-10-31 ENCOUNTER — Inpatient Hospital Stay: Payer: Medicare Other | Attending: Hematology & Oncology

## 2022-10-31 ENCOUNTER — Encounter: Payer: Self-pay | Admitting: Family

## 2022-10-31 ENCOUNTER — Inpatient Hospital Stay: Payer: Medicare Other | Admitting: Family

## 2022-10-31 ENCOUNTER — Inpatient Hospital Stay: Payer: Medicare Other

## 2022-10-31 VITALS — BP 123/60 | HR 62 | Temp 98.3°F | Ht 64.0 in | Wt 167.1 lb

## 2022-10-31 DIAGNOSIS — D500 Iron deficiency anemia secondary to blood loss (chronic): Secondary | ICD-10-CM

## 2022-10-31 DIAGNOSIS — D46Z Other myelodysplastic syndromes: Secondary | ICD-10-CM

## 2022-10-31 DIAGNOSIS — Z7901 Long term (current) use of anticoagulants: Secondary | ICD-10-CM | POA: Diagnosis not present

## 2022-10-31 DIAGNOSIS — D469 Myelodysplastic syndrome, unspecified: Secondary | ICD-10-CM | POA: Insufficient documentation

## 2022-10-31 DIAGNOSIS — D508 Other iron deficiency anemias: Secondary | ICD-10-CM | POA: Insufficient documentation

## 2022-10-31 DIAGNOSIS — D72819 Decreased white blood cell count, unspecified: Secondary | ICD-10-CM

## 2022-10-31 DIAGNOSIS — K909 Intestinal malabsorption, unspecified: Secondary | ICD-10-CM | POA: Diagnosis not present

## 2022-10-31 DIAGNOSIS — Z79899 Other long term (current) drug therapy: Secondary | ICD-10-CM | POA: Diagnosis not present

## 2022-10-31 LAB — CBC WITH DIFFERENTIAL (CANCER CENTER ONLY)
Abs Immature Granulocytes: 0.02 10*3/uL (ref 0.00–0.07)
Basophils Absolute: 0 10*3/uL (ref 0.0–0.1)
Basophils Relative: 0 %
Eosinophils Absolute: 0 10*3/uL (ref 0.0–0.5)
Eosinophils Relative: 0 %
HCT: 34.9 % — ABNORMAL LOW (ref 36.0–46.0)
Hemoglobin: 11 g/dL — ABNORMAL LOW (ref 12.0–15.0)
Immature Granulocytes: 1 %
Lymphocytes Relative: 57 %
Lymphs Abs: 1.3 10*3/uL (ref 0.7–4.0)
MCH: 27.6 pg (ref 26.0–34.0)
MCHC: 31.5 g/dL (ref 30.0–36.0)
MCV: 87.5 fL (ref 80.0–100.0)
Monocytes Absolute: 0.4 10*3/uL (ref 0.1–1.0)
Monocytes Relative: 18 %
Neutro Abs: 0.6 10*3/uL — ABNORMAL LOW (ref 1.7–7.7)
Neutrophils Relative %: 24 %
Platelet Count: 148 10*3/uL — ABNORMAL LOW (ref 150–400)
RBC: 3.99 MIL/uL (ref 3.87–5.11)
RDW: 19.6 % — ABNORMAL HIGH (ref 11.5–15.5)
WBC Count: 2.4 10*3/uL — ABNORMAL LOW (ref 4.0–10.5)
nRBC: 0 % (ref 0.0–0.2)

## 2022-10-31 LAB — CMP (CANCER CENTER ONLY)
ALT: 20 U/L (ref 0–44)
AST: 15 U/L (ref 15–41)
Albumin: 4.2 g/dL (ref 3.5–5.0)
Alkaline Phosphatase: 78 U/L (ref 38–126)
Anion gap: 9 (ref 5–15)
BUN: 15 mg/dL (ref 8–23)
CO2: 28 mmol/L (ref 22–32)
Calcium: 9.9 mg/dL (ref 8.9–10.3)
Chloride: 102 mmol/L (ref 98–111)
Creatinine: 0.75 mg/dL (ref 0.44–1.00)
GFR, Estimated: >60 mL/min (ref >60)
Glucose, Bld: 174 mg/dL — ABNORMAL HIGH (ref 70–99)
Potassium: 4 mmol/L (ref 3.5–5.1)
Sodium: 139 mmol/L (ref 135–145)
Total Bilirubin: 0.5 mg/dL (ref 0.3–1.2)
Total Protein: 8.2 g/dL — ABNORMAL HIGH (ref 6.5–8.1)

## 2022-10-31 MED ORDER — ALBUTEROL SULFATE (2.5 MG/3ML) 0.083% IN NEBU: 2.5000 mg | INHALATION_SOLUTION | Freq: Once | RESPIRATORY_TRACT | Status: DC | PRN

## 2022-10-31 MED ORDER — EPINEPHRINE 0.3 MG/0.3ML IJ SOAJ: 0.3000 mg | Freq: Once | INTRAMUSCULAR | Status: DC | PRN

## 2022-10-31 MED ORDER — SODIUM CHLORIDE 0.9 % IV SOLN: Freq: Once | INTRAVENOUS | Status: DC | PRN

## 2022-10-31 MED ORDER — SODIUM CHLORIDE 0.9% FLUSH: 10.0000 mL | Freq: Once | INTRAVENOUS | Status: DC | PRN

## 2022-10-31 MED ORDER — METHYLPREDNISOLONE SODIUM SUCC 125 MG IJ SOLR: 125.0000 mg | Freq: Once | INTRAMUSCULAR | Status: DC | PRN

## 2022-10-31 MED ORDER — SODIUM CHLORIDE 0.9% FLUSH: 3.0000 mL | Freq: Once | INTRAVENOUS | Status: DC | PRN

## 2022-10-31 MED ORDER — HEPARIN SOD (PORK) LOCK FLUSH 100 UNIT/ML IV SOLN: 250.0000 [IU] | Freq: Once | INTRAVENOUS | Status: DC | PRN

## 2022-10-31 MED ORDER — ALTEPLASE 2 MG IJ SOLR: 2.0000 mg | Freq: Once | INTRAMUSCULAR | Status: DC | PRN

## 2022-10-31 MED ORDER — HEPARIN SOD (PORK) LOCK FLUSH 100 UNIT/ML IV SOLN: 500.0000 [IU] | Freq: Once | INTRAVENOUS | Status: DC | PRN

## 2022-10-31 MED ORDER — FAMOTIDINE IN NACL 20-0.9 MG/50ML-% IV SOLN: 20.0000 mg | Freq: Once | INTRAVENOUS | Status: DC | PRN

## 2022-10-31 MED ORDER — FILGRASTIM-AAFI 300 MCG/0.5ML IJ SOSY
300.0000 ug | PREFILLED_SYRINGE | Freq: Once | INTRAMUSCULAR | Status: AC
  Administered 2022-10-31: 300 ug via SUBCUTANEOUS
  Filled 2022-10-31: qty 0.5

## 2022-10-31 MED ORDER — FILGRASTIM-SNDZ 480 MCG/0.8ML IJ SOSY: 480.0000 ug | PREFILLED_SYRINGE | Freq: Once | INTRAMUSCULAR | Status: DC

## 2022-10-31 MED ORDER — DIPHENHYDRAMINE HCL 50 MG/ML IJ SOLN: 50.0000 mg | Freq: Once | INTRAMUSCULAR | Status: DC | PRN

## 2022-10-31 MED ORDER — DARBEPOETIN ALFA 300 MCG/0.6ML IJ SOSY: 300.0000 ug | PREFILLED_SYRINGE | Freq: Once | INTRAMUSCULAR | Status: DC

## 2022-10-31 NOTE — Progress Notes (Signed)
Orders for filgrastim 300 mcg today added per Dr. Antonieta Pert instructions.

## 2022-10-31 NOTE — Patient Instructions (Signed)
Filgrastim Injection What is this medication? FILGRASTIM (fil GRA stim) lowers the risk of infection in people who are receiving chemotherapy. It works by helping your body make more white blood cells, which protects your body from infection. It may also be used to help people who have been exposed to high doses of radiation. It can be used to help prepare your body before a stem cell transplant. It works by helping your bone marrow make and release stem cells into the blood. This medicine may be used for other purposes; ask your health care provider or pharmacist if you have questions. COMMON BRAND NAME(S): Neupogen, Nivestym, Releuko, Zarxio What should I tell my care team before I take this medication? They need to know if you have any of these conditions: History of blood diseases, such as sickle cell anemia Kidney disease Recent or ongoing radiation An unusual or allergic reaction to filgrastim, pegfilgrastim, latex, rubber, other medications, foods, dyes, or preservatives Pregnant or trying to get pregnant Breast-feeding How should I use this medication? This medication is injected under the skin or into a vein. It is usually given by your care team in a hospital or clinic setting. It may be given at home. If you get this medication at home, you will be taught how to prepare and give it. Use exactly as directed. Take it as directed on the prescription label at the same time every day. Keep taking it unless your care team tells you to stop. It is important that you put your used needles and syringes in a special sharps container. Do not put them in a trash can. If you do not have a sharps container, call your pharmacist or care team to get one. This medication comes with INSTRUCTIONS FOR USE. Ask your pharmacist for directions on how to use this medication. Read the information carefully. Talk to your pharmacist or care team if you have questions. Talk to your care team about the use of this  medication in children. While it may be prescribed for children for selected conditions, precautions do apply. Overdosage: If you think you have taken too much of this medicine contact a poison control center or emergency room at once. NOTE: This medicine is only for you. Do not share this medicine with others. What if I miss a dose? It is important not to miss any doses. Talk to your care team about what to do if you miss a dose. What may interact with this medication? Medications that may cause a release of neutrophils, such as lithium This list may not describe all possible interactions. Give your health care provider a list of all the medicines, herbs, non-prescription drugs, or dietary supplements you use. Also tell them if you smoke, drink alcohol, or use illegal drugs. Some items may interact with your medicine. What should I watch for while using this medication? Your condition will be monitored carefully while you are receiving this medication. You may need bloodwork while taking this medication. Talk to your care team about your risk of cancer. You may be more at risk for certain types of cancer if you take this medication. What side effects may I notice from receiving this medication? Side effects that you should report to your care team as soon as possible: Allergic reactions--skin rash, itching, hives, swelling of the face, lips, tongue, or throat Capillary leak syndrome--stomach or muscle pain, unusual weakness or fatigue, feeling faint or lightheaded, decrease in the amount of urine, swelling of the ankles, hands, or   feet, trouble breathing High white blood cell level--fever, fatigue, trouble breathing, night sweats, change in vision, weight loss Inflammation of the aorta--fever, fatigue, back, chest, or stomach pain, severe headache Kidney injury (glomerulonephritis)--decrease in the amount of urine, red or dark brown urine, foamy or bubbly urine, swelling of the ankles, hands, or  feet Shortness of breath or trouble breathing Spleen injury--pain in upper left stomach or shoulder Unusual bruising or bleeding Side effects that usually do not require medical attention (report to your care team if they continue or are bothersome): Back pain Bone pain Fatigue Fever Headache Nausea This list may not describe all possible side effects. Call your doctor for medical advice about side effects. You may report side effects to FDA at 1-800-FDA-1088. Where should I keep my medication? Keep out of the reach of children and pets. Keep this medication in the original packaging until you are ready to take it. Protect from light. See product for storage information. Each product may have different instructions. Get rid of any unused medication after the expiration date. To get rid of medications that are no longer needed or have expired: Take the medication to a medications take-back program. Check with your pharmacy or law enforcement to find a location. If you cannot return the medication, ask your pharmacist or care team how to get rid of this medication safely. NOTE: This sheet is a summary. It may not cover all possible information. If you have questions about this medicine, talk to your doctor, pharmacist, or health care provider.  2023 Elsevier/Gold Standard (2022-01-24 00:00:00)  

## 2022-10-31 NOTE — Progress Notes (Signed)
Hematology and Oncology Follow Up Visit  Kristen Murray [id number] Oct 09, 1946 77 y.o. 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 Paulita Fujita.  WBC count is 2.4, ANC 0.6, Hgb 11.0, platelets 148.  She notes some fatigue at times.  She states that she recently had some blood noted in her urine with UTI. She finishes her antibiotic today.    No fever, chills, n/v, cough, rash, dizziness, SOB, chest pain, palpitations, abdominal pain or changes in bowel or bladder habits.  No swelling, tenderness, numbness or tingling in her extremities.  No falls or syncope reported.  Appetite and hydration have been good. Weight is stable at 167 lbs.   ECOG Performance Status: 1 - Symptomatic but completely ambulatory  Medications:  Allergies as of 10/31/2022       Reactions   Codeine Hives, Other (See Comments)   Because of a history of documented adverse serious drug reaction, Medi Alert bracelet  is recommended   Macrodantin [nitrofurantoin Macrocrystal] Other (See Comments)   Numbness, aching all over   Other Shortness Of Breath, Rash, Other (See Comments)   Pine nuts & walnuts throat congestion. No documented angioedema.   Propoxyphene Hives, Swelling, Rash, Other (See Comments)   Propoxyphene Hcl Hives, Swelling, Rash   Dulaglutide Other (See Comments)   Other reaction(s): weak, ha, no appetite   Latex Rash, Other (See Comments)   Metformin Hcl Other (See Comments)   Other reaction(s): leg discomfort   Neosporin [bacitracin-polymyxin B] Other (See Comments)   Blisters   Zoster Vac Recomb Adjuvanted Rash, Other (See Comments)   Other reaction(s): confusion   Avocado  Rash   Banana Rash   Ciprofloxacin Nausea Only, Other (See Comments)   Nausea (Note: Patient takes this when on mission trips, however)   Tizanidine Other (See Comments)   Reaction not recalled   Tramadol Nausea Only        Medication List        Accurate as of October 31, 2022 10:51 AM. If you have any questions, ask your nurse or doctor.          acetaminophen 325 MG tablet Commonly known as: TYLENOL Take 325-650 mg by mouth every 6 (six) hours as needed (for pain).   ALPRAZolam 0.25 MG tablet Commonly known as: XANAX Take 0.25 mg by mouth 2 (two) times daily as needed.   azelastine 0.1 % nasal spray Commonly known as: ASTELIN Place into both nostrils 2 (two) times daily. Use in each nostril as directed   b complex vitamins capsule daily.   cetirizine 10 MG tablet Commonly known as: ZYRTEC Take 1 tablet (10 mg total) by mouth daily as needed for allergies (Can take an extra dose during flare ups.).   Cholecalciferol 25 MCG (1000 UT) capsule Take 1,000 Units by mouth daily.   clotrimazole-betamethasone cream Commonly known as: Lotrisone Apply 1 Application topically 2 (two) times daily. Use for 2 weeks.   cyclobenzaprine 10 MG tablet Commonly known as: FLEXERIL Take 2.5 mg by mouth daily as needed for muscle spasms.   dextromethorphan-guaiFENesin 30-600 MG 12hr tablet Commonly known  as: MUCINEX DM Take 1 tablet by mouth 2 (two) times daily.   diclofenac 0.1 % ophthalmic solution Commonly known as: VOLTAREN 4 (four) times daily as needed.   DropSafe Alcohol Prep 70 % Pads Apply topically.   Eliquis 5 MG Tabs tablet Generic drug: apixaban TAKE 1 TABLET TWICE DAILY   EPINEPHrine 0.3 mg/0.3 mL Soaj injection Commonly known as: EPI-PEN Inject 0.3 mg into the muscle as needed for anaphylaxis.   fluorometholone 0.1 % ophthalmic suspension Commonly known as: FML Place 1 drop into both eyes every Monday, Wednesday, and Friday.   fluticasone 50 MCG/ACT  nasal spray Commonly known as: FLONASE Place 1 spray into both nostrils 2 (two) times daily.   folic acid 093 MCG tablet Commonly known as: FOLVITE daily.   freestyle lancets Check blood sugar once daily as directed DX:790.29 (FREESTYLE FREEDOM LITE)   GaviLyte-G 236 g solution Generic drug: polyethylene glycol   glipiZIDE 10 MG 24 hr tablet Commonly known as: GLUCOTROL XL Take 10 mg by mouth daily.   glucose blood test strip Inject 1 strip into the skin daily.   glucose blood test strip Commonly known as: FREESTYLE LITE CHECK BLOOD SUGAR ONCE DAILY AS DIRECTED.  DX:790.29   ipratropium 0.06 % nasal spray Commonly known as: ATROVENT Place 2 sprays into both nostrils 3 (three) times daily.   lidocaine 5 % ointment Commonly known as: XYLOCAINE Apply 1 application topically 3 (three) times daily as needed.   loratadine 10 MG tablet Commonly known as: CLARITIN Take 1 tablet (10 mg total) by mouth daily.   metoprolol tartrate 50 MG tablet Commonly known as: LOPRESSOR TAKE 1 AND 1/2 TABLETS TWICE DAILY   montelukast 10 MG tablet Commonly known as: SINGULAIR Take 1 tablet (10 mg total) by mouth at bedtime.   multivitamin tablet Take 1 tablet by mouth daily. Shaklee brand   nitroGLYCERIN 0.4 MG SL tablet Commonly known as: NITROSTAT Place 1 tablet (0.4 mg total) under the tongue every 5 (five) minutes as needed for chest pain.   pantoprazole 40 MG tablet Commonly known as: PROTONIX Take 1 tablet (40 mg total) by mouth 2 (two) times daily.   rosuvastatin 5 MG tablet Commonly known as: CRESTOR Take 5 mg by mouth See admin instructions. Take 5 mg by mouth at bedtime on Mondays and Thursdays..Currently on hold   tretinoin 0.025 % cream Commonly known as: RETIN-A Apply 1 application. topically at bedtime.   trolamine salicylate 10 % cream Commonly known as: ASPERCREME Apply 1 application topically as needed for muscle pain.   valACYclovir 500 MG tablet Commonly  known as: VALTREX Take 1 tablet (500 mg total) by mouth 2 (two) times daily. Take twice a day for thee days as needed for an outbreak.   verapamil 180 MG CR tablet Commonly known as: CALAN-SR Take 180 mg by mouth at bedtime.        Allergies:  Allergies  Allergen Reactions   Codeine Hives and Other (See Comments)    Because of a history of documented adverse serious drug reaction, Medi Alert bracelet  is recommended   Macrodantin [Nitrofurantoin Macrocrystal] Other (See Comments)    Numbness, aching all over   Other Shortness Of Breath, Rash and Other (See Comments)    Pine nuts & walnuts throat congestion. No documented angioedema.    Propoxyphene Hives, Swelling, Rash and Other (See Comments)   Propoxyphene Hcl Hives, Swelling and Rash   Dulaglutide Other (See Comments)    Other reaction(s): weak, ha,  no appetite   Latex Rash and Other (See Comments)   Metformin Hcl Other (See Comments)    Other reaction(s): leg discomfort   Neosporin [Bacitracin-Polymyxin B] Other (See Comments)    Blisters   Zoster Vac Recomb Adjuvanted Rash and Other (See Comments)    Other reaction(s): confusion   Avocado Rash   Banana Rash   Ciprofloxacin Nausea Only and Other (See Comments)    Nausea (Note: Patient takes this when on mission trips, however)   Tizanidine Other (See Comments)    Reaction not recalled   Tramadol Nausea Only    Past Medical History, Surgical history, Social history, and Family History were reviewed and updated.  Review of Systems: All other 10 point review of systems is negative.   Physical Exam:  height is '5\' 4"'$  (1.626 m) and weight is 167 lb 1.3 oz (75.8 kg). Her oral temperature is 98.3 F (36.8 C). Her blood pressure is 123/60 and her pulse is 62. Her oxygen saturation is 98%.   Wt Readings from Last 3 Encounters:  10/31/22 167 lb 1.3 oz (75.8 kg)  10/17/22 170 lb (77.1 kg)  10/04/22 169 lb (76.7 kg)    Ocular: Sclerae unicteric, pupils equal, round and  reactive to light Ear-nose-throat: Oropharynx clear, dentition fair Lymphatic: No cervical or supraclavicular adenopathy Lungs no rales or rhonchi, good excursion bilaterally Heart regular rate and rhythm, no murmur appreciated Abd soft, nontender, positive bowel sounds MSK no focal spinal tenderness, no joint edema Neuro: non-focal, well-oriented, appropriate affect Breasts: Deferred   Lab Results  Component Value Date   WBC 2.4 (L) 10/31/2022   HGB 11.0 (L) 10/31/2022   HCT 34.9 (L) 10/31/2022   MCV 87.5 10/31/2022   PLT 148 (L) 10/31/2022   Lab Results  Component Value Date   FERRITIN 523 (H) 10/17/2022   IRON 29 10/17/2022   TIBC 244 (L) 10/17/2022   UIBC 215 10/17/2022   IRONPCTSAT 12 10/17/2022   Lab Results  Component Value Date   RETICCTPCT 0.8 07/20/2022   RBC 3.99 10/31/2022   No results found for: "KPAFRELGTCHN", "LAMBDASER", "KAPLAMBRATIO" No results found for: "IGGSERUM", "IGA", "IGMSERUM" No results found for: "TOTALPROTELP", "ALBUMINELP", "A1GS", "A2GS", "BETS", "BETA2SER", "GAMS", "MSPIKE", "SPEI"   Chemistry      Component Value Date/Time   NA 139 10/31/2022 1008   NA 138 03/10/2020 1138   K 4.0 10/31/2022 1008   CL 102 10/31/2022 1008   CO2 28 10/31/2022 1008   BUN 15 10/31/2022 1008   BUN 10 03/10/2020 1138   CREATININE 0.75 10/31/2022 1008      Component Value Date/Time   CALCIUM 9.9 10/31/2022 1008   ALKPHOS 78 10/31/2022 1008   AST 15 10/31/2022 1008   ALT 20 10/31/2022 1008   BILITOT 0.5 10/31/2022 1008       Impression and Plan: Ms. Drew is a very pleasant 77 yo African American female with low-grade MDS, low IPSS-R score.  Her WBC count is down at 2.4. We will give her Neupogen today prior to her procedure with Dr. Paulita Fujita.  No ESA needed, Hgb 11.0.  We will continue to follow-up in 4 weeks.   Lottie Dawson, NP 1/2/202410:51 AM

## 2022-11-01 ENCOUNTER — Ambulatory Visit: Payer: Medicare HMO | Admitting: Podiatry

## 2022-11-02 ENCOUNTER — Encounter: Payer: Self-pay | Admitting: Family

## 2022-11-06 ENCOUNTER — Ambulatory Visit: Payer: Medicare Other | Admitting: Podiatry

## 2022-11-06 VITALS — BP 150/71

## 2022-11-06 DIAGNOSIS — E119 Type 2 diabetes mellitus without complications: Secondary | ICD-10-CM

## 2022-11-06 DIAGNOSIS — M79675 Pain in left toe(s): Secondary | ICD-10-CM | POA: Diagnosis not present

## 2022-11-06 DIAGNOSIS — M79674 Pain in right toe(s): Secondary | ICD-10-CM | POA: Diagnosis not present

## 2022-11-06 DIAGNOSIS — B351 Tinea unguium: Secondary | ICD-10-CM | POA: Diagnosis not present

## 2022-11-06 NOTE — Progress Notes (Signed)
Chief Complaint  Patient presents with   Nail Problem    Nail trim     SUBJECTIVE Patient with a history of diabetes mellitus presents to office today complaining of elongated, thickened nails that cause pain while ambulating in shoes.  Patient is unable to trim their own nails. Patient is here for further evaluation and treatment.  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 No documented angioedema.    Propoxyphene Hives, Swelling, Rash and Other (See Comments)   Propoxyphene Hcl Hives, Swelling and Rash   Dulaglutide Other (See Comments)    Other reaction(s): weak, ha, no appetite   Latex Rash and Other (See Comments)   Metformin Hcl Other (See Comments)    Other reaction(s): leg discomfort   Neosporin [Bacitracin-Polymyxin B] Other (See Comments)     Blisters   Zoster Vac Recomb Adjuvanted Rash and Other (See Comments)    Other reaction(s): confusion   Avocado Rash   Banana Rash   Ciprofloxacin Nausea Only and Other (See Comments)    Nausea (Note: Patient takes this when on mission trips, however)   Tizanidine Other (See Comments)    Reaction not recalled   Tramadol Nausea Only     OBJECTIVE General Patient is awake, alert, and oriented x 3 and in no acute distress. Derm Skin is dry and supple bilateral. Negative open lesions or macerations. Remaining integument unremarkable. Nails are tender, long, thickened and dystrophic with subungual debris, consistent with onychomycosis, 1-5 bilateral. No signs of infection noted. Vasc  DP and PT pedal pulses palpable bilaterally. Temperature gradient within normal limits.  Neuro Epicritic and protective threshold sensation diminished bilaterally.  Musculoskeletal Exam No symptomatic pedal deformities noted bilateral. Muscular strength within normal limits.  ASSESSMENT 1. Diabetes Mellitus w/ peripheral neuropathy 2.  Pain due to onychomycosis of toenails bilateral  PLAN OF CARE 1. Patient evaluated today. 2. Instructed to maintain good pedal hygiene and foot care. Stressed importance of controlling blood sugar.  3. Mechanical debridement of nails 1-5 bilaterally performed using a nail nipper. Filed with dremel without incident.  4. Return to clinic in 3 mos.     Edrick Kins, DPM [facility]  Dr. Edrick Kins, DPM    [address], [place] [address]                Office [phone]  Fax [phone]

## 2022-11-09 ENCOUNTER — Ambulatory Visit (HOSPITAL_COMMUNITY): Payer: Medicare Other | Attending: Physician Assistant

## 2022-11-09 DIAGNOSIS — R0602 Shortness of breath: Secondary | ICD-10-CM | POA: Diagnosis present

## 2022-11-09 LAB — ECHOCARDIOGRAM COMPLETE
Area-P 1/2: 3.83 cm2
S' Lateral: 2.4 cm

## 2022-11-10 ENCOUNTER — Encounter: Payer: Self-pay | Admitting: Physician Assistant

## 2022-11-10 ENCOUNTER — Telehealth: Payer: Self-pay | Admitting: *Deleted

## 2022-11-10 DIAGNOSIS — I340 Nonrheumatic mitral (valve) insufficiency: Secondary | ICD-10-CM

## 2022-11-10 HISTORY — DX: Nonrheumatic mitral (valve) insufficiency: I34.0

## 2022-11-10 NOTE — Telephone Encounter (Signed)
-----  Message from [name], [place] sent at 11/10/2022  8:15 AM EST ----- See MyChart comment. I have sent a copy to her PCP as an FYI. PLAN: -Repeat Echocardiogram 1 year (Dx mitral regurgitation)  Kristen Murray  Your echocardiogram demonstrates normal ejection fraction (heart function).  There is some stiffness of the heart during relaxation (diastolic dysfunction).  This can cause shortness of breath but we did a lab test at your last visit that was able to rule this out (NT-proBNP).  Your mitral valve is leaking (much regurgitation) somewhat more than it did in 2021 on echocardiogram.  However this is not severe enough to cause any symptoms.  I will repeat an echocardiogram in 1 year to keep an eye on this.  Overall, your echocardiogram results are reassuring.   Continue current medications/treatment plan and follow up as scheduled.  Richardson Dopp, PA-C    11/10/2022 8:09 AM

## 2022-11-17 ENCOUNTER — Other Ambulatory Visit: Payer: Self-pay

## 2022-11-17 ENCOUNTER — Other Ambulatory Visit: Payer: Self-pay | Admitting: Allergy and Immunology

## 2022-11-17 DIAGNOSIS — I4892 Unspecified atrial flutter: Secondary | ICD-10-CM

## 2022-11-17 MED ORDER — APIXABAN 5 MG PO TABS: 5.0000 mg | ORAL_TABLET | Freq: Two times a day (BID) | ORAL | 1 refills | Status: DC

## 2022-11-17 MED ORDER — METOPROLOL TARTRATE 50 MG PO TABS: ORAL_TABLET | ORAL | 3 refills | Status: DC

## 2022-11-17 NOTE — Telephone Encounter (Signed)
Eliquis '5mg'$  refill request received. Patient is 77 years old, weight-75.8kg, Crea-0.75 on 10/31/22, Diagnosis-Aflutter, and last seen by Richardson Dopp on 10/04/2022. Dose is appropriate based on dosing criteria. Will send in refill to requested pharmacy.

## 2022-11-28 ENCOUNTER — Inpatient Hospital Stay: Payer: Medicare Other

## 2022-11-28 ENCOUNTER — Encounter: Payer: Self-pay | Admitting: Family

## 2022-11-28 ENCOUNTER — Inpatient Hospital Stay (HOSPITAL_BASED_OUTPATIENT_CLINIC_OR_DEPARTMENT_OTHER): Payer: Medicare Other | Admitting: Family

## 2022-11-28 ENCOUNTER — Telehealth: Payer: Self-pay

## 2022-11-28 VITALS — BP 123/58 | HR 79 | Temp 98.6°F | Resp 18 | Wt 168.1 lb

## 2022-11-28 DIAGNOSIS — D46Z Other myelodysplastic syndromes: Secondary | ICD-10-CM | POA: Diagnosis not present

## 2022-11-28 DIAGNOSIS — D500 Iron deficiency anemia secondary to blood loss (chronic): Secondary | ICD-10-CM

## 2022-11-28 DIAGNOSIS — D469 Myelodysplastic syndrome, unspecified: Secondary | ICD-10-CM | POA: Diagnosis not present

## 2022-11-28 DIAGNOSIS — D72819 Decreased white blood cell count, unspecified: Secondary | ICD-10-CM

## 2022-11-28 LAB — CBC WITH DIFFERENTIAL (CANCER CENTER ONLY)
Abs Immature Granulocytes: 0.15 10*3/uL — ABNORMAL HIGH (ref 0.00–0.07)
Basophils Absolute: 0 10*3/uL (ref 0.0–0.1)
Basophils Relative: 0 %
Eosinophils Absolute: 0 10*3/uL (ref 0.0–0.5)
Eosinophils Relative: 0 %
HCT: 37.2 % (ref 36.0–46.0)
Hemoglobin: 11.7 g/dL — ABNORMAL LOW (ref 12.0–15.0)
Immature Granulocytes: 7 %
Lymphocytes Relative: 64 %
Lymphs Abs: 1.4 10*3/uL (ref 0.7–4.0)
MCH: 26.9 pg (ref 26.0–34.0)
MCHC: 31.5 g/dL (ref 30.0–36.0)
MCV: 85.5 fL (ref 80.0–100.0)
Monocytes Absolute: 0.3 10*3/uL (ref 0.1–1.0)
Monocytes Relative: 14 %
Neutro Abs: 0.3 10*3/uL — CL (ref 1.7–7.7)
Neutrophils Relative %: 15 %
Platelet Count: 167 10*3/uL (ref 150–400)
RBC: 4.35 MIL/uL (ref 3.87–5.11)
RDW: 18.7 % — ABNORMAL HIGH (ref 11.5–15.5)
WBC Count: 2.2 10*3/uL — ABNORMAL LOW (ref 4.0–10.5)
nRBC: 0 % (ref 0.0–0.2)

## 2022-11-28 LAB — CMP (CANCER CENTER ONLY)
ALT: 17 U/L (ref 0–44)
AST: 17 U/L (ref 15–41)
Albumin: 3.9 g/dL (ref 3.5–5.0)
Alkaline Phosphatase: 79 U/L (ref 38–126)
Anion gap: 8 (ref 5–15)
BUN: 13 mg/dL (ref 8–23)
CO2: 28 mmol/L (ref 22–32)
Calcium: 9.9 mg/dL (ref 8.9–10.3)
Chloride: 101 mmol/L (ref 98–111)
Creatinine: 0.67 mg/dL (ref 0.44–1.00)
GFR, Estimated: >60 mL/min (ref >60)
Glucose, Bld: 227 mg/dL — ABNORMAL HIGH (ref 70–99)
Potassium: 4.4 mmol/L (ref 3.5–5.1)
Sodium: 137 mmol/L (ref 135–145)
Total Bilirubin: 0.4 mg/dL (ref 0.3–1.2)
Total Protein: 7.5 g/dL (ref 6.5–8.1)

## 2022-11-28 LAB — IRON AND IRON BINDING CAPACITY (CC-WL,HP ONLY)
Iron: 19 ug/dL — ABNORMAL LOW (ref 28–170)
Saturation Ratios: 8 % — ABNORMAL LOW (ref 10.4–31.8)
TIBC: 232 ug/dL — ABNORMAL LOW (ref 250–450)
UIBC: 213 ug/dL (ref 148–442)

## 2022-11-28 LAB — FERRITIN: Ferritin: 559 ng/mL — ABNORMAL HIGH (ref 11–307)

## 2022-11-28 NOTE — Telephone Encounter (Signed)
Critical ANC of 0.3 received from lab. Gillian Shields informed.

## 2022-11-28 NOTE — Progress Notes (Signed)
Hematology and Oncology Follow Up Visit  Kristen Murray [id number] 16-Jul-1946 76 y.o. 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 She is doing well at this time. She notes some fatigue at times.  No blood loss noted. No bruising or petechiae.  No fever, chills, n/v, cough, rash, dizziness, SOB, chest pain, palpitations, abdominal pain or changes in bowel or bladder habits.  No swelling, tenderness, numbness or tingling in her extremities.  No falls or syncope.  Appetite and hydration are good. Weight is stable at 168 lbs.   ECOG Performance Status: 1 - Symptomatic but completely ambulatory  Medications:  Allergies as of 11/28/2022       Reactions   Codeine Hives, Other (See Comments)   Because of a history of documented adverse serious drug reaction, Medi Alert bracelet  is recommended   Macrodantin [nitrofurantoin Macrocrystal] Other (See Comments)   Numbness, aching all over   Other Shortness Of Breath, Rash, Other (See Comments)   Pine nuts & walnuts throat congestion. No documented angioedema.   Propoxyphene Hives, Swelling, Rash, Other (See Comments)   Propoxyphene Hcl Hives, Swelling, Rash   Dulaglutide Other (See Comments)   Other reaction(s): weak, ha, no appetite   Latex Rash, Other (See Comments)   Metformin Hcl Other (See Comments)   Other reaction(s): leg discomfort   Neosporin [bacitracin-polymyxin B] Other (See Comments)   Blisters   Zoster Vac Recomb Adjuvanted Rash, Other (See Comments)   Other reaction(s): confusion   Avocado Rash   Banana Rash   Ciprofloxacin Nausea Only, Other (See Comments)   Nausea (Note: Patient takes this when on mission trips, however)   Tizanidine Other (See Comments)   Reaction  not recalled   Tramadol Nausea Only        Medication List        Accurate as of November 28, 2022 10:40 AM. If you have any questions, ask your nurse or doctor.          acetaminophen 325 MG tablet Commonly known as: TYLENOL Take 325-650 mg by mouth every 6 (six) hours as needed (for pain).   ALPRAZolam 0.25 MG tablet Commonly known as: XANAX Take 0.25 mg by mouth 2 (two) times daily as needed.   apixaban 5 MG Tabs tablet Commonly known as: Eliquis Take 1 tablet (5 mg total) by mouth 2 (two) times daily.   azelastine 0.1 % nasal spray Commonly known as: ASTELIN Place into both nostrils 2 (two) times daily. Use in each nostril as directed   b complex vitamins capsule daily.   cetirizine 10 MG tablet Commonly known as: ZYRTEC Take 1 tablet (10 mg total) by mouth daily as needed for allergies (Can take an extra dose during flare ups.).   Cholecalciferol 25 MCG (1000 UT) capsule Take 1,000 Units by mouth daily.   clotrimazole-betamethasone cream Commonly known as: Lotrisone Apply 1 Application topically 2 (two) times daily. Use for 2 weeks.   cyclobenzaprine 10 MG tablet Commonly known as: FLEXERIL Take 2.5 mg by mouth daily as needed for muscle spasms.   dextromethorphan-guaiFENesin 30-600 MG 12hr tablet Commonly known as: MUCINEX DM Take 1 tablet by mouth 2 (two) times  daily.   diclofenac 0.1 % ophthalmic solution Commonly known as: VOLTAREN 4 (four) times daily as needed.   DropSafe Alcohol Prep 70 % Pads Apply topically.   EPINEPHrine 0.3 mg/0.3 mL Soaj injection Commonly known as: EPI-PEN Inject 0.3 mg into the muscle as needed for anaphylaxis.   fluorometholone 0.1 % ophthalmic suspension Commonly known as: FML Place 1 drop into both eyes every Monday, Wednesday, and Friday.   fluticasone 50 MCG/ACT nasal spray Commonly known as: FLONASE Place 1 spray into both nostrils 2 (two) times daily.   folic acid 426 MCG tablet Commonly known as:  FOLVITE daily.   freestyle lancets Check blood sugar once daily as directed DX:790.29 (FREESTYLE FREEDOM LITE)   GaviLyte-G 236 g solution Generic drug: polyethylene glycol   glipiZIDE 10 MG 24 hr tablet Commonly known as: GLUCOTROL XL Take 10 mg by mouth daily.   glucose blood test strip Inject 1 strip into the skin daily.   glucose blood test strip Commonly known as: FREESTYLE LITE CHECK BLOOD SUGAR ONCE DAILY AS DIRECTED.  DX:790.29   ipratropium 0.06 % nasal spray Commonly known as: ATROVENT Place 2 sprays into both nostrils 3 (three) times daily.   lidocaine 5 % ointment Commonly known as: XYLOCAINE Apply 1 application topically 3 (three) times daily as needed.   loratadine 10 MG tablet Commonly known as: CLARITIN Take 1 tablet (10 mg total) by mouth daily.   metoprolol tartrate 50 MG tablet Commonly known as: LOPRESSOR TAKE 1 AND 1/2 TABLETS TWICE DAILY   montelukast 10 MG tablet Commonly known as: SINGULAIR TAKE 1 TABLET BY MOUTH AT  BEDTIME   multivitamin tablet Take 1 tablet by mouth daily. Shaklee brand   nitroGLYCERIN 0.4 MG SL tablet Commonly known as: NITROSTAT Place 1 tablet (0.4 mg total) under the tongue every 5 (five) minutes as needed for chest pain.   pantoprazole 40 MG tablet Commonly known as: PROTONIX Take 1 tablet (40 mg total) by mouth 2 (two) times daily.   rosuvastatin 5 MG tablet Commonly known as: CRESTOR Take 5 mg by mouth See admin instructions. Take 5 mg by mouth at bedtime on Mondays and Thursdays..Currently on hold   tretinoin 0.025 % cream Commonly known as: RETIN-A Apply 1 application. topically at bedtime.   trolamine salicylate 10 % cream Commonly known as: ASPERCREME Apply 1 application topically as needed for muscle pain.   valACYclovir 500 MG tablet Commonly known as: VALTREX Take 1 tablet (500 mg total) by mouth 2 (two) times daily. Take twice a day for thee days as needed for an outbreak.   verapamil 180 MG  CR tablet Commonly known as: CALAN-SR Take 180 mg by mouth at bedtime.        Allergies:  Allergies  Allergen Reactions   Codeine Hives and Other (See Comments)    Because of a history of documented adverse serious drug reaction, Medi Alert bracelet  is recommended   Macrodantin [Nitrofurantoin Macrocrystal] Other (See Comments)    Numbness, aching all over   Other Shortness Of Breath, Rash and Other (See Comments)    Pine nuts & walnuts throat congestion. No documented angioedema.    Propoxyphene Hives, Swelling, Rash and Other (See Comments)   Propoxyphene Hcl Hives, Swelling and Rash   Dulaglutide Other (See Comments)    Other reaction(s): weak, ha, no appetite   Latex Rash and Other (See Comments)   Metformin Hcl Other (See Comments)    Other reaction(s): leg discomfort   Neosporin [Bacitracin-Polymyxin  B] Other (See Comments)    Blisters   Zoster Vac Recomb Adjuvanted Rash and Other (See Comments)    Other reaction(s): confusion   Avocado Rash   Banana Rash   Ciprofloxacin Nausea Only and Other (See Comments)    Nausea (Note: Patient takes this when on mission trips, however)   Tizanidine Other (See Comments)    Reaction not recalled   Tramadol Nausea Only    Past Medical History, Surgical history, Social history, and Family History were reviewed and updated.  Review of Systems: All other 10 point review of systems is negative.   Physical Exam:  vitals were not taken for this visit.   Wt Readings from Last 3 Encounters:  10/31/22 167 lb 1.3 oz (75.8 kg)  10/17/22 170 lb (77.1 kg)  10/04/22 169 lb (76.7 kg)    Ocular: Sclerae unicteric, pupils equal, round and reactive to light Ear-nose-throat: Oropharynx clear, dentition fair Lymphatic: No cervical or supraclavicular adenopathy Lungs no rales or rhonchi, good excursion bilaterally Heart regular rate and rhythm, no murmur appreciated Abd soft, nontender, positive bowel sounds MSK no focal spinal  tenderness, no joint edema Neuro: non-focal, well-oriented, appropriate affect Breasts: Deferred   Lab Results  Component Value Date   WBC 2.2 (L) 11/28/2022   HGB 11.7 (L) 11/28/2022   HCT 37.2 11/28/2022   MCV 85.5 11/28/2022   PLT 167 11/28/2022   Lab Results  Component Value Date   FERRITIN 523 (H) 10/17/2022   IRON 29 10/17/2022   TIBC 244 (L) 10/17/2022   UIBC 215 10/17/2022   IRONPCTSAT 12 10/17/2022   Lab Results  Component Value Date   RETICCTPCT 0.8 07/20/2022   RBC 4.35 11/28/2022   No results found for: "KPAFRELGTCHN", "LAMBDASER", "KAPLAMBRATIO" No results found for: "IGGSERUM", "IGA", "IGMSERUM" No results found for: "TOTALPROTELP", "ALBUMINELP", "A1GS", "A2GS", "BETS", "BETA2SER", "GAMS", "MSPIKE", "SPEI"   Chemistry      Component Value Date/Time   NA 139 10/31/2022 1008   NA 138 03/10/2020 1138   K 4.0 10/31/2022 1008   CL 102 10/31/2022 1008   CO2 28 10/31/2022 1008   BUN 15 10/31/2022 1008   BUN 10 03/10/2020 1138   CREATININE 0.75 10/31/2022 1008      Component Value Date/Time   CALCIUM 9.9 10/31/2022 1008   ALKPHOS 78 10/31/2022 1008   AST 15 10/31/2022 1008   ALT 20 10/31/2022 1008   BILITOT 0.5 10/31/2022 1008       Impression and Plan:  Kristen Murray is a very pleasant 77 yo African American female with low-grade MDS, low IPSS-R score.  No ESA needed, Hgb 11.7.  We will continue to follow-up in 6 weeks.   Lottie Dawson, NP 1/30/202410:40 AM

## 2022-12-06 ENCOUNTER — Inpatient Hospital Stay: Payer: Medicare Other | Attending: Hematology & Oncology

## 2022-12-06 VITALS — BP 132/63 | HR 61 | Temp 97.6°F | Resp 18

## 2022-12-06 DIAGNOSIS — K909 Intestinal malabsorption, unspecified: Secondary | ICD-10-CM | POA: Diagnosis present

## 2022-12-06 DIAGNOSIS — D46Z Other myelodysplastic syndromes: Secondary | ICD-10-CM

## 2022-12-06 DIAGNOSIS — D72819 Decreased white blood cell count, unspecified: Secondary | ICD-10-CM

## 2022-12-06 DIAGNOSIS — D508 Other iron deficiency anemias: Secondary | ICD-10-CM | POA: Insufficient documentation

## 2022-12-06 DIAGNOSIS — D500 Iron deficiency anemia secondary to blood loss (chronic): Secondary | ICD-10-CM

## 2022-12-06 MED ORDER — SODIUM CHLORIDE 0.9 % IV SOLN
200.0000 mg | Freq: Once | INTRAVENOUS | Status: AC
  Administered 2022-12-06: 200 mg via INTRAVENOUS
  Filled 2022-12-06: qty 200

## 2022-12-06 MED ORDER — SODIUM CHLORIDE 0.9 % IV SOLN: Freq: Once | INTRAVENOUS | Status: AC

## 2022-12-06 NOTE — Patient Instructions (Signed)

## 2022-12-13 ENCOUNTER — Inpatient Hospital Stay: Payer: Medicare Other

## 2022-12-13 VITALS — BP 132/57 | HR 76 | Temp 98.7°F | Resp 20

## 2022-12-13 DIAGNOSIS — D508 Other iron deficiency anemias: Secondary | ICD-10-CM | POA: Diagnosis not present

## 2022-12-13 DIAGNOSIS — D72819 Decreased white blood cell count, unspecified: Secondary | ICD-10-CM

## 2022-12-13 DIAGNOSIS — D500 Iron deficiency anemia secondary to blood loss (chronic): Secondary | ICD-10-CM

## 2022-12-13 DIAGNOSIS — D46Z Other myelodysplastic syndromes: Secondary | ICD-10-CM

## 2022-12-13 MED ORDER — SODIUM CHLORIDE 0.9 % IV SOLN: INTRAVENOUS | Status: DC

## 2022-12-13 MED ORDER — SODIUM CHLORIDE 0.9 % IV SOLN
200.0000 mg | Freq: Once | INTRAVENOUS | Status: AC
  Administered 2022-12-13: 200 mg via INTRAVENOUS
  Filled 2022-12-13: qty 200

## 2022-12-13 NOTE — Patient Instructions (Signed)

## 2022-12-20 ENCOUNTER — Inpatient Hospital Stay: Payer: Medicare Other

## 2022-12-20 VITALS — BP 137/62 | HR 62 | Temp 98.2°F | Resp 17

## 2022-12-20 DIAGNOSIS — D72819 Decreased white blood cell count, unspecified: Secondary | ICD-10-CM

## 2022-12-20 DIAGNOSIS — D46Z Other myelodysplastic syndromes: Secondary | ICD-10-CM

## 2022-12-20 DIAGNOSIS — D500 Iron deficiency anemia secondary to blood loss (chronic): Secondary | ICD-10-CM

## 2022-12-20 DIAGNOSIS — D508 Other iron deficiency anemias: Secondary | ICD-10-CM | POA: Diagnosis not present

## 2022-12-20 MED ORDER — SODIUM CHLORIDE 0.9 % IV SOLN: Freq: Once | INTRAVENOUS | Status: AC

## 2022-12-20 MED ORDER — SODIUM CHLORIDE 0.9 % IV SOLN
200.0000 mg | Freq: Once | INTRAVENOUS | Status: AC
  Administered 2022-12-20: 200 mg via INTRAVENOUS
  Filled 2022-12-20: qty 200

## 2022-12-20 NOTE — Patient Instructions (Signed)

## 2023-01-09 ENCOUNTER — Inpatient Hospital Stay: Payer: Medicare Other | Attending: Hematology & Oncology

## 2023-01-09 ENCOUNTER — Encounter: Payer: Self-pay | Admitting: Family

## 2023-01-09 ENCOUNTER — Inpatient Hospital Stay: Payer: Medicare Other

## 2023-01-09 ENCOUNTER — Telehealth: Payer: Self-pay

## 2023-01-09 ENCOUNTER — Inpatient Hospital Stay: Payer: Medicare Other | Admitting: Family

## 2023-01-09 VITALS — BP 144/61 | HR 63 | Temp 98.4°F | Resp 18 | Wt 168.8 lb

## 2023-01-09 DIAGNOSIS — D72819 Decreased white blood cell count, unspecified: Secondary | ICD-10-CM

## 2023-01-09 DIAGNOSIS — D500 Iron deficiency anemia secondary to blood loss (chronic): Secondary | ICD-10-CM

## 2023-01-09 DIAGNOSIS — D4620 Refractory anemia with excess of blasts, unspecified: Secondary | ICD-10-CM | POA: Insufficient documentation

## 2023-01-09 DIAGNOSIS — D46Z Other myelodysplastic syndromes: Secondary | ICD-10-CM

## 2023-01-09 LAB — CMP (CANCER CENTER ONLY)
ALT: 13 U/L (ref 0–44)
AST: 18 U/L (ref 15–41)
Albumin: 3.2 g/dL — ABNORMAL LOW (ref 3.5–5.0)
Alkaline Phosphatase: 81 U/L (ref 38–126)
Anion gap: 7 (ref 5–15)
BUN: 16 mg/dL (ref 8–23)
CO2: 26 mmol/L (ref 22–32)
Calcium: 8.8 mg/dL — ABNORMAL LOW (ref 8.9–10.3)
Chloride: 100 mmol/L (ref 98–111)
Creatinine: 0.68 mg/dL (ref 0.44–1.00)
GFR, Estimated: >60 mL/min (ref >60)
Glucose, Bld: 194 mg/dL — ABNORMAL HIGH (ref 70–99)
Potassium: 3.9 mmol/L (ref 3.5–5.1)
Sodium: 133 mmol/L — ABNORMAL LOW (ref 135–145)
Total Bilirubin: 0.3 mg/dL (ref 0.3–1.2)
Total Protein: 7.4 g/dL (ref 6.5–8.1)

## 2023-01-09 LAB — CBC WITH DIFFERENTIAL (CANCER CENTER ONLY)
Abs Immature Granulocytes: 0.12 10*3/uL — ABNORMAL HIGH (ref 0.00–0.07)
Basophils Absolute: 0 10*3/uL (ref 0.0–0.1)
Basophils Relative: 0 %
Eosinophils Absolute: 0 10*3/uL (ref 0.0–0.5)
Eosinophils Relative: 0 %
HCT: 33.7 % — ABNORMAL LOW (ref 36.0–46.0)
Hemoglobin: 10.9 g/dL — ABNORMAL LOW (ref 12.0–15.0)
Immature Granulocytes: 5 %
Lymphocytes Relative: 73 %
Lymphs Abs: 1.8 10*3/uL (ref 0.7–4.0)
MCH: 27.1 pg (ref 26.0–34.0)
MCHC: 32.3 g/dL (ref 30.0–36.0)
MCV: 83.8 fL (ref 80.0–100.0)
Monocytes Absolute: 0.3 10*3/uL (ref 0.1–1.0)
Monocytes Relative: 13 %
Neutro Abs: 0.2 10*3/uL — CL (ref 1.7–7.7)
Neutrophils Relative %: 9 %
Platelet Count: 179 10*3/uL (ref 150–400)
RBC: 4.02 MIL/uL (ref 3.87–5.11)
RDW: 18.6 % — ABNORMAL HIGH (ref 11.5–15.5)
Smear Review: ADEQUATE
WBC Count: 2.5 10*3/uL — ABNORMAL LOW (ref 4.0–10.5)
nRBC: 0 % (ref 0.0–0.2)

## 2023-01-09 LAB — FERRITIN: Ferritin: 787 ng/mL — ABNORMAL HIGH (ref 11–307)

## 2023-01-09 MED ORDER — DARBEPOETIN ALFA 300 MCG/0.6ML IJ SOSY
300.0000 ug | PREFILLED_SYRINGE | Freq: Once | INTRAMUSCULAR | Status: AC
  Administered 2023-01-09: 300 ug via SUBCUTANEOUS
  Filled 2023-01-09: qty 0.6

## 2023-01-09 NOTE — Progress Notes (Signed)
Hematology and Oncology Follow Up Visit  Kristen Murray [id number] 04/15/1946 77 y.o. 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 She is doing well and has no complaints at this time. She has occasional fatigue at times.  She is avoiding the outdoors and pollen right now.  No issue with infections. No fever, chills, n/v, cough, rash, dizziness, SOB, chest pain, palpitations, abdominal pain or changes in bowel or bladder habits.  No swelling, tenderness, numbness or tingling in her extremities.  No falls or syncope reported.  Appetite and hydration are good. Weight is stable at 168 lbs.   ECOG Performance Status: 1 - Symptomatic but completely ambulatory  Medications:  Allergies as of 01/09/2023       Reactions   Codeine Hives, Other (See Comments)   Because of a history of documented adverse serious drug reaction, Medi Alert bracelet  is recommended   Macrodantin [nitrofurantoin Macrocrystal] Other (See Comments)   Numbness, aching all over   Other Shortness Of Breath, Rash, Other (See Comments)   Pine nuts & walnuts throat congestion. No documented angioedema.   Propoxyphene Hives, Swelling, Rash, Other (See Comments)   Propoxyphene Hcl Hives, Swelling, Rash   Dulaglutide Other (See Comments)   Other reaction(s): weak, ha, no appetite   Latex Rash, Other (See Comments)   Metformin Hcl Other (See Comments)   Other reaction(s): leg discomfort   Neosporin [bacitracin-polymyxin B] Other (See Comments)   Blisters   Zoster Vac Recomb Adjuvanted Rash, Other (See Comments)   Other reaction(s): confusion   Avocado Rash   Banana Rash   Ciprofloxacin Nausea Only, Other (See Comments)   Nausea (Note: Patient takes  this when on mission trips, however)   Tizanidine Other (See Comments)   Reaction not recalled   Tramadol Nausea Only        Medication List        Accurate as of January 09, 2023  2:08 PM. If you have any questions, ask your nurse or doctor.          acetaminophen 325 MG tablet Commonly known as: TYLENOL Take 325-650 mg by mouth every 6 (six) hours as needed (for pain).   ALPRAZolam 0.25 MG tablet Commonly known as: XANAX Take 0.25 mg by mouth 2 (two) times daily as needed.   apixaban 5 MG Tabs tablet Commonly known as: Eliquis Take 1 tablet (5 mg total) by mouth 2 (two) times daily.   azelastine 0.1 % nasal spray Commonly known as: ASTELIN Place into both nostrils 2 (two) times daily. Use in each nostril as directed   b complex vitamins capsule daily.   cetirizine 10 MG tablet Commonly known as: ZYRTEC Take 1 tablet (10 mg total) by mouth daily as needed for allergies (Can take an extra dose during flare ups.).   Cholecalciferol 25 MCG (1000 UT) capsule Take 1,000 Units by mouth daily.   clotrimazole-betamethasone cream Commonly known as: Lotrisone Apply 1 Application topically 2 (two) times daily. Use for 2 weeks.   cyclobenzaprine 10 MG tablet Commonly known as: FLEXERIL Take 2.5 mg by mouth daily as needed for muscle spasms.   dextromethorphan-guaiFENesin 30-600 MG 12hr tablet  Commonly known as: MUCINEX DM Take 1 tablet by mouth 2 (two) times daily.   diclofenac 0.1 % ophthalmic solution Commonly known as: VOLTAREN 4 (four) times daily as needed.   DropSafe Alcohol Prep 70 % Pads Apply topically.   EPINEPHrine 0.3 mg/0.3 mL Soaj injection Commonly known as: EPI-PEN Inject 0.3 mg into the muscle as needed for anaphylaxis.   fluorometholone 0.1 % ophthalmic suspension Commonly known as: FML Place 1 drop into both eyes every Monday, Wednesday, and Friday.   fluticasone 50 MCG/ACT nasal spray Commonly known as: FLONASE Place 1 spray into both  nostrils 2 (two) times daily.   folic acid [id number] MCG tablet Commonly known as: FOLVITE daily.   freestyle lancets Check blood sugar once daily as directed DX:790.29 (FREESTYLE FREEDOM LITE)   GaviLyte-G 236 g solution Generic drug: polyethylene glycol   glipiZIDE 10 MG 24 hr tablet Commonly known as: GLUCOTROL XL Take 10 mg by mouth daily.   glucose blood test strip Inject 1 strip into the skin daily.   glucose blood test strip Commonly known as: FREESTYLE LITE CHECK BLOOD SUGAR ONCE DAILY AS DIRECTED.  DX:790.29   ipratropium 0.06 % nasal spray Commonly known as: ATROVENT Place 2 sprays into both nostrils 3 (three) times daily.   lidocaine 5 % ointment Commonly known as: XYLOCAINE Apply 1 application topically 3 (three) times daily as needed.   loratadine 10 MG tablet Commonly known as: CLARITIN Take 1 tablet (10 mg total) by mouth daily.   metoprolol tartrate 50 MG tablet Commonly known as: LOPRESSOR TAKE 1 AND 1/2 TABLETS TWICE DAILY   montelukast 10 MG tablet Commonly known as: SINGULAIR TAKE 1 TABLET BY MOUTH AT  BEDTIME   multivitamin tablet Take 1 tablet by mouth daily. Shaklee brand   nitroGLYCERIN 0.4 MG SL tablet Commonly known as: NITROSTAT Place 1 tablet (0.4 mg total) under the tongue every 5 (five) minutes as needed for chest pain.   pantoprazole 40 MG tablet Commonly known as: PROTONIX Take 1 tablet (40 mg total) by mouth 2 (two) times daily.   rosuvastatin 5 MG tablet Commonly known as: CRESTOR Take 5 mg by mouth See admin instructions. Take 5 mg by mouth at bedtime on Mondays and Thursdays..Currently on hold   tretinoin 0.025 % cream Commonly known as: RETIN-A Apply 1 application. topically at bedtime.   trolamine salicylate 10 % cream Commonly known as: ASPERCREME Apply 1 application topically as needed for muscle pain.   valACYclovir 500 MG tablet Commonly known as: VALTREX Take 1 tablet (500 mg total) by mouth 2 (two) times daily.  Take twice a day for thee days as needed for an outbreak.   verapamil 180 MG CR tablet Commonly known as: CALAN-SR Take 180 mg by mouth at bedtime.        Allergies:  Allergies  Allergen Reactions   Codeine Hives and Other (See Comments)    Because of a history of documented adverse serious drug reaction, Medi Alert bracelet  is recommended   Macrodantin [Nitrofurantoin Macrocrystal] Other (See Comments)    Numbness, aching all over   Other Shortness Of Breath, Rash and Other (See Comments)    Pine nuts & walnuts throat congestion. No documented angioedema.    Propoxyphene Hives, Swelling, Rash and Other (See Comments)   Propoxyphene Hcl Hives, Swelling and Rash   Dulaglutide Other (See Comments)    Other reaction(s): weak, ha, no appetite   Latex Rash and Other (See Comments)   Metformin Hcl Other (  See Comments)    Other reaction(s): leg discomfort   Neosporin [Bacitracin-Polymyxin B] Other (See Comments)    Blisters   Zoster Vac Recomb Adjuvanted Rash and Other (See Comments)    Other reaction(s): confusion   Avocado Rash   Banana Rash   Ciprofloxacin Nausea Only and Other (See Comments)    Nausea (Note: Patient takes this when on mission trips, however)   Tizanidine Other (See Comments)    Reaction not recalled   Tramadol Nausea Only    Past Medical History, Surgical history, Social history, and Family History were reviewed and updated.  Review of Systems: All other 10 point review of systems is negative.   Physical Exam:  vitals were not taken for this visit.   Wt Readings from Last 3 Encounters:  11/28/22 168 lb 1.9 oz (76.3 kg)  10/31/22 167 lb 1.3 oz (75.8 kg)  10/17/22 170 lb (77.1 kg)    Ocular: Sclerae unicteric, pupils equal, round and reactive to light Ear-nose-throat: Oropharynx clear, dentition fair Lymphatic: No cervical or supraclavicular adenopathy Lungs no rales or rhonchi, good excursion bilaterally Heart regular rate and rhythm, no murmur  appreciated Abd soft, nontender, positive bowel sounds MSK no focal spinal tenderness, no joint edema Neuro: non-focal, well-oriented, appropriate affect Breasts: Deferred   Lab Results  Component Value Date   WBC 2.2 (L) 11/28/2022   HGB 11.7 (L) 11/28/2022   HCT 37.2 11/28/2022   MCV 85.5 11/28/2022   PLT 167 11/28/2022   Lab Results  Component Value Date   FERRITIN 559 (H) 11/28/2022   IRON 19 (L) 11/28/2022   TIBC 232 (L) 11/28/2022   UIBC 213 11/28/2022   IRONPCTSAT 8 (L) 11/28/2022   Lab Results  Component Value Date   RETICCTPCT 0.8 07/20/2022   RBC 4.35 11/28/2022   No results found for: "KPAFRELGTCHN", "LAMBDASER", "KAPLAMBRATIO" No results found for: "IGGSERUM", "IGA", "IGMSERUM" No results found for: "TOTALPROTELP", "ALBUMINELP", "A1GS", "A2GS", "BETS", "BETA2SER", "GAMS", "MSPIKE", "SPEI"   Chemistry      Component Value Date/Time   NA 137 11/28/2022 1020   NA 138 03/10/2020 1138   K 4.4 11/28/2022 1020   CL 101 11/28/2022 1020   CO2 28 11/28/2022 1020   BUN 13 11/28/2022 1020   BUN 10 03/10/2020 1138   CREATININE 0.67 11/28/2022 1020      Component Value Date/Time   CALCIUM 9.9 11/28/2022 1020   ALKPHOS 79 11/28/2022 1020   AST 17 11/28/2022 1020   ALT 17 11/28/2022 1020   BILITOT 0.4 11/28/2022 1020       Impression and Plan:  Ms. Carland is a very pleasant 77 yo African American female with low-grade MDS, low IPSS-R score.  ESA given, Hgb 10.9.  We will follow-up in 6 weeks  Lottie Dawson, NP 3/12/20242:08 PM

## 2023-01-09 NOTE — Telephone Encounter (Signed)
Critical ANC of 0.21 received from lab, Gillian Shields informed.

## 2023-01-09 NOTE — Patient Instructions (Signed)

## 2023-01-10 LAB — IRON AND IRON BINDING CAPACITY (CC-WL,HP ONLY)
Iron: 30 ug/dL (ref 28–170)
Saturation Ratios: 13 % (ref 10.4–31.8)
TIBC: 228 ug/dL — ABNORMAL LOW (ref 250–450)
UIBC: 198 ug/dL (ref 148–442)

## 2023-02-05 ENCOUNTER — Ambulatory Visit: Payer: Medicare Other | Admitting: Podiatry

## 2023-02-05 DIAGNOSIS — D6869 Other thrombophilia: Secondary | ICD-10-CM | POA: Insufficient documentation

## 2023-02-12 ENCOUNTER — Ambulatory Visit: Payer: Medicare Other | Admitting: Podiatry

## 2023-02-13 NOTE — Progress Notes (Signed)
77 y.o. G32P3003 Married [place] American female here for breast and pelvic exam.   Patient is followed for chronic vulvitis and HSV.  Biopsy of vulva 07/07/22 showed spongiotic dermatitis.  Does not have as much itching.  She treated with Lotrisone twice a week for maintenance dosing.  She uses Valtrex as needed. No outbreak in one year.  Accepts a new refill.   Good bladder control.  Not having incontinence of urine.  Wears pads for freshness when she goes out.   Hx IC.   Treating for vertigo.   Going to [place].   PCP:   Eliane Decree, PA  Patient's last menstrual period was 07/30/1990.           Sexually active: No.  The current method of family planning is post menopausal status.    Exercising: No.   Walking around all day Smoker:  no  Health Maintenance: Pap:  12/15/19 neg, 11/05/14 neg History of abnormal Pap:  no MMG:  03/10/22 Breast Density Cat B, BI-RADS CAT 2 benign Colonoscopy:  10/2022 BMD:   02/10/16  Result  normal TDaP:  09/01/14 Gardasil:   no HIV: never Hep C: never Screening Labs:  PCP   reports that she has never smoked. She has never used smokeless tobacco. She reports that she does not drink alcohol and does not use drugs.  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    HTN (hypertension)    Hyperglycemia    Hyperlipidemia    Meningioma (HCC)    Mitral regurgitation 11/10/2022   TTE  11/09/2022: EF 60-65, no RWMA, GR 2 DD, normal PASP (RVSP 35.7), mild LAE, severe RAE, mild to moderate MR, AV sclerosis, RAP 3   Myelodysplasia, 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   Nephrolithiasis    Dr Logan Bores, [facility], Hx of [3][   Polyp of colon    Radiculopathy     Past Surgical History:  Procedure Laterality Date   ABDOMINAL HYSTERECTOMY     TAH/BSO --fibroids, no cancer   BALLOON DILATION N/A 07/20/2014   Procedure: BALLOON DILATION;  Surgeon: Charolett Bumpers, MD;  Location: [facility];  Service: Endoscopy;  Laterality: N/A;   BREAST BIOPSY     Right   CARPAL TUNNEL RELEASE     & trigger thumb RUE; Dr Chaney Malling   COLONOSCOPY W/ POLYPECTOMY  07/2004   Neg 2011; Dr Danise Edge   CYSTOSCOPY  11/2009   Neg   ESOPHAGOGASTRODUODENOSCOPY N/A 07/20/2014   Procedure: ESOPHAGOGASTRODUODENOSCOPY (EGD);  Surgeon: Charolett Bumpers, MD;  Location: [facility];  Service: Endoscopy;  Laterality: N/A;   FLEXIBLE BRONCHOSCOPY W/ UPPER ENDOSCOPY     neg   KNEE ARTHROSCOPY     Left   ROTATOR CUFF REPAIR     R shoulder   TONSILLECTOMY     TRIGGER FINGER RELEASE Right    Right Thumb  Current Outpatient Medications  Medication Sig Dispense Refill   acetaminophen (TYLENOL) 325 MG tablet Take 325-650 mg by mouth every 6 (six) hours as needed (for pain).     Alcohol Swabs (DROPSAFE ALCOHOL PREP) 70 % PADS Apply topically.     ALPRAZolam (XANAX) 0.25 MG tablet Take 0.25 mg by mouth 2 (two) times daily as needed.     apixaban (ELIQUIS) 5 MG TABS tablet Take 1 tablet (5 mg total) by mouth 2 (two) times daily. 180 tablet 1   azelastine (ASTELIN) 0.1 % nasal spray Place into both nostrils 2 (two) times daily. Use in each nostril as directed     b complex vitamins capsule daily.     cetirizine (ZYRTEC) 10 MG tablet Take 1 tablet (10 mg total) by mouth daily as needed for allergies (Can take an extra dose during flare ups.). 180 tablet 4   Cholecalciferol 25 MCG (1000 UT) capsule Take 1,000 Units by mouth  daily.     clotrimazole-betamethasone (LOTRISONE) cream Apply 1 Application topically 2 (two) times daily. Use for 2 weeks. 30 g 0   cyclobenzaprine (FLEXERIL) 10 MG tablet Take 2.5 mg by mouth daily as needed for muscle spasms.     dextromethorphan-guaiFENesin (MUCINEX DM) 30-600 MG 12hr tablet Take 1 tablet by mouth 2 (two) times daily. 60 tablet 3   diclofenac (VOLTAREN) 0.1 % ophthalmic solution 4 (four) times daily as needed.     EPINEPHrine 0.3 mg/0.3 mL IJ SOAJ injection Inject 0.3 mg into the muscle as needed for anaphylaxis. 1 each 1   fluorometholone (FML) 0.1 % ophthalmic suspension Place 1 drop into both eyes every Monday, Wednesday, and Friday.      fluticasone (FLONASE) 50 MCG/ACT nasal spray Place 1 spray into both nostrils 2 (two) times daily. 48 g 4   GAVILYTE-G 236 g solution      glipiZIDE (GLUCOTROL XL) 10 MG 24 hr tablet Take 10 mg by mouth daily.     glucose blood (FREESTYLE LITE) test strip CHECK BLOOD SUGAR ONCE DAILY AS DIRECTED.  DX:790.29 100 each 12   glucose blood test strip Inject 1 strip into the skin daily.     ipratropium (ATROVENT) 0.06 % nasal spray Place 2 sprays into both nostrils 3 (three) times daily. 15 mL 11   Lancets (FREESTYLE) lancets Check blood sugar once daily as directed DX:790.29 (FREESTYLE FREEDOM LITE) 100 each 1   lidocaine (XYLOCAINE) 5 % ointment Apply 1 application topically 3 (three) times daily as needed. 1.25 g 0   loratadine (CLARITIN) 10 MG tablet Take 1 tablet (10 mg total) by mouth daily. 30 tablet 0   metoprolol tartrate (LOPRESSOR) 50 MG tablet TAKE 1 AND 1/2 TABLETS TWICE DAILY 270 tablet 3   montelukast (SINGULAIR) 10 MG tablet TAKE 1 TABLET BY MOUTH AT  BEDTIME 90 tablet 1   Multiple Vitamin (MULTIVITAMIN) tablet Take 1 tablet by mouth daily. Shaklee brand     nitroGLYCERIN (NITROSTAT) 0.4 MG SL tablet Place 1 tablet (0.4 mg total) under the tongue every 5 (five) minutes as needed for chest pain. 25 tablet 3   pantoprazole  (PROTONIX) 40 MG tablet Take 1 tablet (40 mg total) by mouth 2 (two) times daily. 180 tablet 4   rosuvastatin (CRESTOR) 5 MG tablet Take 5 mg by mouth See admin instructions. Take 5 mg by mouth at bedtime on Mondays and Thursdays..Currently on hold     tretinoin (RETIN-A) 0.025 % cream Apply 1 application. topically at bedtime.  trolamine salicylate (ASPERCREME) 10 % cream Apply 1 application topically as needed for muscle pain.     valACYclovir (VALTREX) 500 MG tablet Take 1 tablet (500 mg total) by mouth 2 (two) times daily. Take twice a day for thee days as needed for an outbreak. 30 tablet 1   folic acid (FOLVITE) 400 MCG tablet daily. (Patient not taking: Reported on 02/27/2023)     verapamil (CALAN-SR) 180 MG CR tablet Take 180 mg by mouth at bedtime.  (Patient not taking: Reported on 02/27/2023)     No current facility-administered medications for this visit.   Facility-Administered Medications Ordered in Other Visits  Medication Dose Route Frequency Provider Last Rate Last Admin   filgrastim-sndz (ZARXIO) injection 300 mcg  300 mcg Subcutaneous Once Josph Macho, MD        Family History  Problem Relation Age of Onset   Alcohol abuse Mother    Other Father        unknown   Diabetes Maternal Aunt    Diabetes Maternal Uncle    Tuberculosis Maternal Uncle    Diabetes Maternal Grandmother    Coronary artery disease Neg Hx     Review of Systems  All other systems reviewed and are negative.   Exam:   BP 130/72 (BP Location: Left Arm, Patient Position: Sitting, Cuff Size: Normal)   Pulse 61   Ht 5' 4.5" (1.638 m)   Wt 167 lb (75.8 kg)   LMP 07/30/1990   SpO2 99%   BMI 28.22 kg/m     General appearance: alert, cooperative and appears stated age Head: normocephalic, without obvious abnormality, atraumatic Neck: no adenopathy, supple, symmetrical, trachea midline and thyroid normal to inspection and palpation Lungs: clear to auscultation bilaterally Breasts: normal  appearance, no masses or tenderness, No nipple retraction or dimpling, No nipple discharge or bleeding, No axillary adenopathy Heart: regular rate and rhythm Abdomen: soft, non-tender; no masses, no organomegaly Extremities: extremities normal, atraumatic, no cyanosis or edema Skin: skin color, texture, turgor normal. No rashes or lesions Lymph nodes: cervical, supraclavicular, and axillary nodes normal. Neurologic: grossly normal  Pelvic: External genitalia:  no lesions              No abnormal inguinal nodes palpated.              Urethra:  normal appearing urethra with no masses, tenderness or lesions              Bartholins and Skenes: normal                 Vagina: normal appearing vagina with normal color and discharge, no lesions              Cervix:  absent              Pap taken: no Bimanual Exam:  Uterus:  absent              Adnexa: no mass, fullness, tenderness              Rectal exam: yes.  Confirms.              Anus:  normal sphincter tone, no lesions  Chaperone was present for exam:  Warren Lacy, CMA  Assessment:   High risk Medicare GYN. Status post TAH/BSO.  Chronic vulvitis.   Hx HSV.  Encounter for medication monitoring.  Urinary incontinence. Using pads.  DM.  CAD. On Eliquis.  Low WBC.   Plan: Mammogram screening  discussed. Self breast awareness reviewed. Pap and HR HPV not indicated.  Guidelines for Calcium, Vitamin D, regular exercise program including cardiovascular and weight bearing exercise. Refill of Lotrisone cream.  Refill of Valtrex.  Follow up annually and prn.  25 min  total time was spent for this patient encounter, including preparation, face-to-face counseling with the patient, coordination of care, and documentation of the encounter in addition to doing the breat and pelvic exam.

## 2023-02-20 ENCOUNTER — Telehealth: Payer: Self-pay

## 2023-02-20 ENCOUNTER — Inpatient Hospital Stay: Payer: Medicare Other | Attending: Hematology & Oncology

## 2023-02-20 ENCOUNTER — Other Ambulatory Visit: Payer: Self-pay

## 2023-02-20 ENCOUNTER — Inpatient Hospital Stay: Payer: Medicare Other

## 2023-02-20 ENCOUNTER — Encounter: Payer: Self-pay | Admitting: Family

## 2023-02-20 ENCOUNTER — Inpatient Hospital Stay (HOSPITAL_BASED_OUTPATIENT_CLINIC_OR_DEPARTMENT_OTHER): Payer: Medicare Other | Admitting: Family

## 2023-02-20 VITALS — BP 138/67 | HR 80 | Temp 98.3°F | Resp 16

## 2023-02-20 DIAGNOSIS — Z79899 Other long term (current) drug therapy: Secondary | ICD-10-CM | POA: Diagnosis not present

## 2023-02-20 DIAGNOSIS — D4620 Refractory anemia with excess of blasts, unspecified: Secondary | ICD-10-CM | POA: Insufficient documentation

## 2023-02-20 DIAGNOSIS — K909 Intestinal malabsorption, unspecified: Secondary | ICD-10-CM | POA: Diagnosis not present

## 2023-02-20 DIAGNOSIS — D500 Iron deficiency anemia secondary to blood loss (chronic): Secondary | ICD-10-CM

## 2023-02-20 DIAGNOSIS — D508 Other iron deficiency anemias: Secondary | ICD-10-CM | POA: Insufficient documentation

## 2023-02-20 LAB — CBC WITH DIFFERENTIAL (CANCER CENTER ONLY)
Abs Immature Granulocytes: 0.01 10*3/uL (ref 0.00–0.07)
Basophils Absolute: 0 10*3/uL (ref 0.0–0.1)
Basophils Relative: 0 %
Eosinophils Absolute: 0 10*3/uL (ref 0.0–0.5)
Eosinophils Relative: 0 %
HCT: 36.1 % (ref 36.0–46.0)
Hemoglobin: 11.4 g/dL — ABNORMAL LOW (ref 12.0–15.0)
Immature Granulocytes: 0 %
Lymphocytes Relative: 70 %
Lymphs Abs: 1.6 10*3/uL (ref 0.7–4.0)
MCH: 26.5 pg (ref 26.0–34.0)
MCHC: 31.6 g/dL (ref 30.0–36.0)
MCV: 83.8 fL (ref 80.0–100.0)
Monocytes Absolute: 0.4 10*3/uL (ref 0.1–1.0)
Monocytes Relative: 16 %
Neutro Abs: 0.3 10*3/uL — CL (ref 1.7–7.7)
Neutrophils Relative %: 14 %
Platelet Count: 157 10*3/uL (ref 150–400)
RBC: 4.31 MIL/uL (ref 3.87–5.11)
RDW: 19 % — ABNORMAL HIGH (ref 11.5–15.5)
WBC Count: 2.2 10*3/uL — ABNORMAL LOW (ref 4.0–10.5)
nRBC: 0 % (ref 0.0–0.2)

## 2023-02-20 LAB — FERRITIN: Ferritin: 789 ng/mL — ABNORMAL HIGH (ref 11–307)

## 2023-02-20 LAB — CMP (CANCER CENTER ONLY)
ALT: 12 U/L (ref 0–44)
AST: 15 U/L (ref 15–41)
Albumin: 3.7 g/dL (ref 3.5–5.0)
Alkaline Phosphatase: 78 U/L (ref 38–126)
Anion gap: 5 (ref 5–15)
BUN: 15 mg/dL (ref 8–23)
CO2: 32 mmol/L (ref 22–32)
Calcium: 9.7 mg/dL (ref 8.9–10.3)
Chloride: 100 mmol/L (ref 98–111)
Creatinine: 0.72 mg/dL (ref 0.44–1.00)
GFR, Estimated: >60 mL/min (ref >60)
Glucose, Bld: 162 mg/dL — ABNORMAL HIGH (ref 70–99)
Potassium: 4.3 mmol/L (ref 3.5–5.1)
Sodium: 137 mmol/L (ref 135–145)
Total Bilirubin: 0.3 mg/dL (ref 0.3–1.2)
Total Protein: 7.7 g/dL (ref 6.5–8.1)

## 2023-02-20 LAB — IRON AND IRON BINDING CAPACITY (CC-WL,HP ONLY)
Iron: 92 ug/dL (ref 28–170)
Saturation Ratios: 41 % — ABNORMAL HIGH (ref 10.4–31.8)
TIBC: 223 ug/dL — ABNORMAL LOW (ref 250–450)
UIBC: 131 ug/dL — ABNORMAL LOW (ref 148–442)

## 2023-02-20 NOTE — Progress Notes (Signed)
Hematology and Oncology Follow Up Visit  Kristen Murray [id number] [birth year] 77 y.o. 02/20/2023   Principle Diagnosis:  Myelodysplasia -- low grade -- IPSS-R == 1.5 --  DNMT3A (+) Iron deficiency anemia-malabsorption   Current Therapy:        IV iron as indicated   Neulasta 6 mcg sq PRN prior to procedures Aranesp 300 mcg sq for Hgb < 11   Interim History:  Kristen Murray is here today for follow-up. She is having some issues with vertigo and feeling off balance. Thankfully she has not had any falls or syncope.  She is ambulating with a cane for added support when needed.  No blood loss noted. No bruising or petechiae.  She has not had any issue with infections. WBC count is stable at 2.2 and ANC 0.2.  No fever, chills, n/v, cough, rash, dizziness, SOB, chest pain, palpitations, abdominal pain or changes in bowel or bladder habits.  No swelling, tenderness, numbness or tingling in her extremities.  Appetite and hydration are good. Weight is stable at 169 lbs.   ECOG Performance Status: 1 - Symptomatic but completely ambulatory  Medications:  Allergies as of 02/20/2023       Reactions   Codeine Hives, Other (See Comments)   Because of a history of documented adverse serious drug reaction, Medi Alert bracelet  is recommended   Macrodantin [nitrofurantoin Macrocrystal] Other (See Comments)   Numbness, aching all over   Other Shortness Of Breath, Rash, Other (See Comments)   Pine nuts & walnuts throat congestion. No documented angioedema.   Propoxyphene Hives, Swelling, Rash, Other (See Comments)   Propoxyphene Hcl Hives, Swelling, Rash   Dulaglutide Other (See Comments)   Other reaction(s): weak, ha, no appetite   Latex Rash, Other (See Comments)   Metformin Hcl Other (See Comments)   Other reaction(s): leg discomfort   Neosporin [bacitracin-polymyxin B] Other (See Comments)   Blisters   Zoster Vac Recomb Adjuvanted Rash, Other (See Comments)   Other reaction(s): confusion    Avocado Rash   Banana Rash   Ciprofloxacin Nausea Only, Other (See Comments)   Nausea (Note: Patient takes this when on mission trips, however)   Tizanidine Other (See Comments)   Reaction not recalled   Tramadol Nausea Only        Medication List        Accurate as of February 20, 2023  1:42 PM. If you have any questions, ask your nurse or doctor.          acetaminophen 325 MG tablet Commonly known as: TYLENOL Take 325-650 mg by mouth every 6 (six) hours as needed (for pain).   ALPRAZolam 0.25 MG tablet Commonly known as: XANAX Take 0.25 mg by mouth 2 (two) times daily as needed.   apixaban 5 MG Tabs tablet Commonly known as: Eliquis Take 1 tablet (5 mg total) by mouth 2 (two) times daily.   azelastine 0.1 % nasal spray Commonly known as: ASTELIN Place into both nostrils 2 (two) times daily. Use in each nostril as directed   b complex vitamins capsule daily.   cetirizine 10 MG tablet Commonly known as: ZYRTEC Take 1 tablet (10 mg total) by mouth daily as needed for allergies (Can take an extra dose during flare ups.).   Cholecalciferol 25 MCG (1000 UT) capsule Take 1,000 Units by mouth daily.   clotrimazole-betamethasone cream Commonly known as: Lotrisone Apply 1 Application topically 2 (two) times daily. Use for 2 weeks.   cyclobenzaprine 10 MG tablet  Commonly known as: FLEXERIL Take 2.5 mg by mouth daily as needed for muscle spasms.   dextromethorphan-guaiFENesin 30-600 MG 12hr tablet Commonly known as: MUCINEX DM Take 1 tablet by mouth 2 (two) times daily.   diclofenac 0.1 % ophthalmic solution Commonly known as: VOLTAREN 4 (four) times daily as needed.   DropSafe Alcohol Prep 70 % Pads Apply topically.   EPINEPHrine 0.3 mg/0.3 mL Soaj injection Commonly known as: EPI-PEN Inject 0.3 mg into the muscle as needed for anaphylaxis.   fluorometholone 0.1 % ophthalmic suspension Commonly known as: FML Place 1 drop into both eyes every Monday,  Wednesday, and Friday.   fluticasone 50 MCG/ACT nasal spray Commonly known as: FLONASE Place 1 spray into both nostrils 2 (two) times daily.   folic acid 400 MCG tablet Commonly known as: FOLVITE daily.   freestyle lancets Check blood sugar once daily as directed DX:790.29 (FREESTYLE FREEDOM LITE)   GaviLyte-G 236 g solution Generic drug: polyethylene glycol   glipiZIDE 10 MG 24 hr tablet Commonly known as: GLUCOTROL XL Take 10 mg by mouth daily.   glucose blood test strip Inject 1 strip into the skin daily.   glucose blood test strip Commonly known as: FREESTYLE LITE CHECK BLOOD SUGAR ONCE DAILY AS DIRECTED.  DX:790.29   ipratropium 0.06 % nasal spray Commonly known as: ATROVENT Place 2 sprays into both nostrils 3 (three) times daily.   lidocaine 5 % ointment Commonly known as: XYLOCAINE Apply 1 application topically 3 (three) times daily as needed.   loratadine 10 MG tablet Commonly known as: CLARITIN Take 1 tablet (10 mg total) by mouth daily.   metoprolol tartrate 50 MG tablet Commonly known as: LOPRESSOR TAKE 1 AND 1/2 TABLETS TWICE DAILY   montelukast 10 MG tablet Commonly known as: SINGULAIR TAKE 1 TABLET BY MOUTH AT  BEDTIME   multivitamin tablet Take 1 tablet by mouth daily. Shaklee brand   nitroGLYCERIN 0.4 MG SL tablet Commonly known as: NITROSTAT Place 1 tablet (0.4 mg total) under the tongue every 5 (five) minutes as needed for chest pain.   pantoprazole 40 MG tablet Commonly known as: PROTONIX Take 1 tablet (40 mg total) by mouth 2 (two) times daily.   rosuvastatin 5 MG tablet Commonly known as: CRESTOR Take 5 mg by mouth See admin instructions. Take 5 mg by mouth at bedtime on Mondays and Thursdays..Currently on hold   tretinoin 0.025 % cream Commonly known as: RETIN-A Apply 1 application. topically at bedtime.   trolamine salicylate 10 % cream Commonly known as: ASPERCREME Apply 1 application topically as needed for muscle pain.    valACYclovir 500 MG tablet Commonly known as: VALTREX Take 1 tablet (500 mg total) by mouth 2 (two) times daily. Take twice a day for thee days as needed for an outbreak.   verapamil 180 MG CR tablet Commonly known as: CALAN-SR Take 180 mg by mouth at bedtime.        Allergies:  Allergies  Allergen Reactions   Codeine Hives and Other (See Comments)    Because of a history of documented adverse serious drug reaction, Medi Alert bracelet  is recommended   Macrodantin [Nitrofurantoin Macrocrystal] Other (See Comments)    Numbness, aching all over   Other Shortness Of Breath, Rash and Other (See Comments)    Pine nuts & walnuts throat congestion. No documented angioedema.    Propoxyphene Hives, Swelling, Rash and Other (See Comments)   Propoxyphene Hcl Hives, Swelling and Rash   Dulaglutide Other (See Comments)  Other reaction(s): weak, ha, no appetite   Latex Rash and Other (See Comments)   Metformin Hcl Other (See Comments)    Other reaction(s): leg discomfort   Neosporin [Bacitracin-Polymyxin B] Other (See Comments)    Blisters   Zoster Vac Recomb Adjuvanted Rash and Other (See Comments)    Other reaction(s): confusion   Avocado Rash   Banana Rash   Ciprofloxacin Nausea Only and Other (See Comments)    Nausea (Note: Patient takes this when on mission trips, however)   Tizanidine Other (See Comments)    Reaction not recalled   Tramadol Nausea Only    Past Medical History, Surgical history, Social history, and Family History were reviewed and updated.  Review of Systems: All other 10 point review of systems is negative.   Physical Exam:  vitals were not taken for this visit.   Wt Readings from Last 3 Encounters:  01/09/23 168 lb 12.8 oz (76.6 kg)  11/28/22 168 lb 1.9 oz (76.3 kg)  10/31/22 167 lb 1.3 oz (75.8 kg)    Ocular: Sclerae unicteric, pupils equal, round and reactive to light Ear-nose-throat: Oropharynx clear, dentition fair Lymphatic: No cervical or  supraclavicular adenopathy Lungs no rales or rhonchi, good excursion bilaterally Heart regular rate and rhythm, no murmur appreciated Abd soft, nontender, positive bowel sounds MSK no focal spinal tenderness, no joint edema Neuro: non-focal, well-oriented, appropriate affect Breasts: Deferred   Lab Results  Component Value Date   WBC 2.5 (L) 01/09/2023   HGB 10.9 (L) 01/09/2023   HCT 33.7 (L) 01/09/2023   MCV 83.8 01/09/2023   PLT 179 01/09/2023   Lab Results  Component Value Date   FERRITIN 787 (H) 01/09/2023   IRON 30 01/09/2023   TIBC 228 (L) 01/09/2023   UIBC 198 01/09/2023   IRONPCTSAT 13 01/09/2023   Lab Results  Component Value Date   RETICCTPCT 0.8 07/20/2022   RBC 4.02 01/09/2023   No results found for: "KPAFRELGTCHN", "LAMBDASER", "KAPLAMBRATIO" No results found for: "IGGSERUM", "IGA", "IGMSERUM" No results found for: "TOTALPROTELP", "ALBUMINELP", "A1GS", "A2GS", "BETS", "BETA2SER", "GAMS", "MSPIKE", "SPEI"   Chemistry      Component Value Date/Time   NA 133 (L) 01/09/2023 1358   NA 138 03/10/2020 1138   K 3.9 01/09/2023 1358   CL 100 01/09/2023 1358   CO2 26 01/09/2023 1358   BUN 16 01/09/2023 1358   BUN 10 03/10/2020 1138   CREATININE 0.68 01/09/2023 1358      Component Value Date/Time   CALCIUM 8.8 (L) 01/09/2023 1358   ALKPHOS 81 01/09/2023 1358   AST 18 01/09/2023 1358   ALT 13 01/09/2023 1358   BILITOT 0.3 01/09/2023 1358       Impression and Plan: Ms. Silliman is a very pleasant 77 yo African American female with low-grade MDS, low IPSS-R score.  ESA held, Hgb 11.4.  We will follow-up in 6 weeks  Eileen Stanford, NP 4/23/20241:42 PM

## 2023-02-20 NOTE — Telephone Encounter (Signed)
Critical result: ANC 0.3, MD aware.

## 2023-02-23 ENCOUNTER — Ambulatory Visit: Payer: Medicare Other | Admitting: Podiatry

## 2023-02-23 DIAGNOSIS — M79674 Pain in right toe(s): Secondary | ICD-10-CM

## 2023-02-23 DIAGNOSIS — B351 Tinea unguium: Secondary | ICD-10-CM

## 2023-02-23 DIAGNOSIS — M79675 Pain in left toe(s): Secondary | ICD-10-CM | POA: Diagnosis not present

## 2023-02-23 NOTE — Progress Notes (Signed)
Chief Complaint  Patient presents with   Diabetes    Diabetic foot care, Nail trim A1c-8.0 BG- 132    SUBJECTIVE Patient with a history of diabetes mellitus presents to office today complaining of elongated, thickened nails that cause pain while ambulating in shoes.  Patient is unable to trim their own nails. Patient is here for further evaluation and treatment.  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   Nephrolithiasis    Dr Logan Bores, [facility], Hx of [3][   Polyp of colon    Radiculopathy     Allergies  Allergen Reactions   Codeine Hives and Other (See Comments)    Because of a history of documented adverse serious drug reaction, Medi Alert bracelet  is recommended   Macrodantin [Nitrofurantoin Macrocrystal] Other (See Comments)    Numbness, aching all over   Other Shortness Of Breath, Rash and Other (See Comments)    Pine nuts & walnuts throat congestion. No documented angioedema.    Propoxyphene Hives, Swelling, Rash and Other (See Comments)   Propoxyphene Hcl Hives, Swelling and Rash   Dulaglutide Other (See Comments)    Other  reaction(s): weak, ha, no appetite   Latex Rash and Other (See Comments)   Metformin Hcl Other (See Comments)    Other reaction(s): leg discomfort   Neosporin [Bacitracin-Polymyxin B] Other (See Comments)    Blisters   Zoster Vac Recomb Adjuvanted Rash and Other (See Comments)    Other reaction(s): confusion   Avocado Rash   Banana Rash   Ciprofloxacin Nausea Only and Other (See Comments)    Nausea (Note: Patient takes this when on mission trips, however)   Tizanidine Other (See Comments)    Reaction not recalled   Tramadol Nausea Only     OBJECTIVE General Patient is awake, alert, and oriented x 3 and in no acute distress. Derm Skin is dry and supple bilateral. Negative open lesions or macerations. Remaining integument unremarkable. Nails are tender, long, thickened and dystrophic with subungual debris, consistent with onychomycosis, 1-5 bilateral. No signs of infection noted. Vasc  DP and PT pedal pulses palpable bilaterally. Temperature gradient within normal limits.  Neuro Epicritic and protective threshold sensation diminished bilaterally.  Musculoskeletal Exam No symptomatic pedal deformities noted bilateral. Muscular strength within normal limits.  ASSESSMENT 1. Diabetes Mellitus w/ peripheral neuropathy 2.  Pain due to onychomycosis of toenails bilateral  PLAN OF CARE 1. Patient evaluated today. 2. Instructed to maintain good pedal hygiene and foot care. Stressed importance of controlling blood sugar.  3.  Mechanical debridement of nails 1-5 bilaterally performed using a nail nipper. Filed with dremel without incident.  4. Return to clinic in 3 mos.     Felecia Shelling, DPM [facility]  Dr. Felecia Shelling, DPM    [address] [place], [place] [address]                Office [phone]  Fax [phone]

## 2023-02-27 ENCOUNTER — Encounter: Payer: Self-pay | Admitting: Obstetrics and Gynecology

## 2023-02-27 ENCOUNTER — Ambulatory Visit: Payer: Medicare Other | Attending: Physician Assistant

## 2023-02-27 ENCOUNTER — Other Ambulatory Visit: Payer: Self-pay

## 2023-02-27 ENCOUNTER — Ambulatory Visit (INDEPENDENT_AMBULATORY_CARE_PROVIDER_SITE_OTHER): Payer: Medicare Other | Admitting: Obstetrics and Gynecology

## 2023-02-27 VITALS — BP 130/72 | HR 61 | Ht 64.5 in | Wt 167.0 lb

## 2023-02-27 DIAGNOSIS — Z9189 Other specified personal risk factors, not elsewhere classified: Secondary | ICD-10-CM

## 2023-02-27 DIAGNOSIS — Z5181 Encounter for therapeutic drug level monitoring: Secondary | ICD-10-CM | POA: Diagnosis not present

## 2023-02-27 DIAGNOSIS — B009 Herpesviral infection, unspecified: Secondary | ICD-10-CM

## 2023-02-27 DIAGNOSIS — H8111 Benign paroxysmal vertigo, right ear: Secondary | ICD-10-CM | POA: Insufficient documentation

## 2023-02-27 DIAGNOSIS — N763 Subacute and chronic vulvitis: Secondary | ICD-10-CM | POA: Diagnosis not present

## 2023-02-27 MED ORDER — CLOTRIMAZOLE-BETAMETHASONE 1-0.05 % EX CREA: 1.0000 | TOPICAL_CREAM | Freq: Two times a day (BID) | CUTANEOUS | 1 refills | Status: DC

## 2023-02-27 MED ORDER — VALACYCLOVIR HCL 500 MG PO TABS: 500.0000 mg | ORAL_TABLET | Freq: Two times a day (BID) | ORAL | 1 refills | Status: DC

## 2023-02-27 NOTE — Therapy (Signed)
OUTPATIENT PHYSICAL THERAPY VESTIBULAR EVALUATION     Patient Name: Kristen Murray MRN: [id number] DOB:14-Oct-1946, 77 y.o., female Today's Date: 02/27/2023  END OF SESSION:  PT End of Session - 02/27/23 0953     Visit Number 1    Date for PT Re-Evaluation 04/24/23    Progress Note Due on Visit 10    PT Start Time 0806    PT Stop Time 0905    PT Time Calculation (min) 59 min    Activity Tolerance Patient tolerated treatment well    Behavior During Therapy [facility] for tasks assessed/performed             Past Medical History:  Diagnosis Date   Arthritis    Borderline abnormal TFTs    as a teen   CAD (coronary artery disease)    Coronary CTA 3/22: Calcium score 1 (38th percentile), minimal nonobstructive CAD with 0-24% in mid RCA; aortic atherosclerosis   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   Nephrolithiasis    Dr Logan Bores, [facility], Hx of [3][   Polyp of colon    Radiculopathy    Past Surgical History:  Procedure Laterality Date   ABDOMINAL HYSTERECTOMY     TAH/BSO --fibroids, no cancer   BALLOON DILATION N/A 07/20/2014   Procedure: BALLOON DILATION;  Surgeon: Charolett Bumpers, MD;  Location: [facility];  Service: Endoscopy;  Laterality: N/A;   BREAST BIOPSY     Right   CARPAL TUNNEL RELEASE     & trigger thumb RUE; Dr Chaney Malling   COLONOSCOPY W/ POLYPECTOMY  07/2004   Neg 2011;  Dr Danise Edge   CYSTOSCOPY  11/2009   Neg   ESOPHAGOGASTRODUODENOSCOPY N/A 07/20/2014   Procedure: ESOPHAGOGASTRODUODENOSCOPY (EGD);  Surgeon: Charolett Bumpers, MD;  Location: [facility];  Service: Endoscopy;  Laterality: N/A;   FLEXIBLE BRONCHOSCOPY W/ UPPER ENDOSCOPY     neg   KNEE ARTHROSCOPY     Left   ROTATOR CUFF REPAIR     R shoulder   TONSILLECTOMY     TRIGGER FINGER RELEASE Right    Right Thumb   Patient Active Problem List   Diagnosis Date Noted   Mitral regurgitation 11/10/2022   Shortness of breath 10/04/2022   Allergic rhinitis due to pollen 05/29/2022   Cervical spondylosis without myelopathy 05/29/2022   Hyperglycemia due to type 2 diabetes mellitus (HCC) 05/29/2022   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   Perennial allergic rhinitis 05/29/2022   Pure hypercholesterolemia 05/29/2022   Simple chronic conjunctivitis 05/29/2022   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   Partial thickness rotator cuff tear 03/17/2022   Dysuria 02/28/2022   CAD (coronary artery disease)    Myelodysplasia, 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   Spinal stenosis of lumbar region 07/30/2020   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   Bilateral knee pain 05/11/2020  Atrial flutter (HCC) 01/29/2020   A-fib (HCC) 01/27/2020   Atrial flutter, paroxysmal (HCC) 01/26/2020   Lumbar spondylosis 10/27/2019   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   Iron deficiency anemia due to chronic blood loss 07/18/2019   Closed fracture of proximal phalanx of great toe 09/02/2018   Nuclear sclerosis of both eyes 08/20/2017   High risk medication use 06/15/2017   Recurrent urinary tract infection 10/14/2015   Pain in the chest    Chest pain at rest 11/26/2014   Shingles 05/14/2014   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   Vaginal atrophy 10/16/2012   Menopause 10/16/2012   Chest pain, atypical 02/19/2012   Type 2 diabetes mellitus with neurological manifestations, 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/19/2012   Migraine 01/09/2012    Interstitial cystitis (chronic) without hematuria 12/20/2011   Cervical stenosis of spinal canal 08/28/2011   Meningioma (HCC) 08/28/2011   COSTOCHONDRITIS 11/16/2010   DISTURBANCES OF SENSATION OF SMELL AND TASTE 04/05/2010   NEPHROLITHIASIS, HX OF 02/08/2010   EDEMA 10/01/2009   Brachial neuritis or radiculitis NOS 06/14/2009   EXTERNAL HEMORRHOIDS 05/25/2009   MUSCLE PAIN 05/17/2009   Hyperlipidemia 04/23/2009   ABNORMAL THYROID FUNCTION TESTS 04/23/2009   MENINGIOMA 12/26/2007   HEADACHE 12/26/2007   RADICULOPATHY 04/02/2007   Essential hypertension 11/21/2006   GERD 11/21/2006    PCP: Eliane Decree, PA REFERRING PROVIDER: Eliane Decree, PA  REFERRING DIAG: vertigo  THERAPY DIAG:  BPPV (benign paroxysmal positional vertigo), right  ONSET DATE: 4 weeks   Rationale for Evaluation and Treatment: Rehabilitation  SUBJECTIVE:   SUBJECTIVE STATEMENT: Woke up with consistent dizziness, when I move from sitting into supine and R sidelying, have to get up because I feel so dizzy Pt accompanied by: self  PERTINENT HISTORY: woke up 4 weeks ago with vertigo Sx  PAIN:  Are you having pain? Yes: NPRS scale: 3/10 Pain location: upper traps, post c spine Pain description: continuous, chronic Aggravating factors: excessive movement particularly with looking down Relieving factors: heat, ice, meds  PRECAUTIONS: None  WEIGHT BEARING RESTRICTIONS: No  FALLS: Has patient fallen in last 6 months? No  LIVING ENVIRONMENT: Lives with: lives with their spouse Lives in: House/apartment Stairs: Yes: Internal: 11 steps; on right going up and External: 4 steps; on left going up Has following equipment at home: Single point cane  PLOF: Independent  PATIENT GOALS: get rid of dizziness, be able to put my eye drops in without the dizziness  OBJECTIVE:   DIAGNOSTIC FINDINGS: na  COGNITION: Overall cognitive status: Within functional limits for tasks  assessed   SENSATION: WFL    MUSCLE TONE: wnl   POSTURE:  increased thoracic kyphosis  Cervical ROM:    Active A/PROM (deg) eval  Flexion 25  Extension 20  Right lateral flexion   Left lateral flexion   Right rotation 40  Left rotation 40  (Blank rows = not tested)  STRENGTH: wfl Ue's   LOWER EXTREMITY MMT: WFL LE's  Sit to supine Min A  TRANSFERS: Assistive device utilized: None  Sit to stand: Complete Independence Stand to sit: Complete Independence Chair to chair: Complete Independence Floor:  na   GAIT: Gait pattern: step through pattern, decreased arm swing- Right, decreased arm swing- Left, decreased step length- Right, decreased step length- Left, decreased stride length, Right foot flat, and Left foot flat Distance walked: in clinic, over  59' at a time Assistive device utilized: None Level of assistance: Complete Independence Comments: slowed gait speed  FUNCTIONAL TESTS:  Not performed today  PATIENT SURVEYS:  DHI 58 ,   VESTIBULAR ASSESSMENT:  GENERAL OBSERVATION: healthy and pleasant female, neck guarded with functional movements, stiff   SYMPTOM BEHAVIOR:  Subjective history: 4 week h/o o sudden onset vertigo, nausea, no head trauma, had allergies.  Non-Vestibular symptoms: neck pain, headaches, and nausea/vomiting  Type of dizziness: Imbalance (Disequilibrium) and Unsteady with head/body turns  Frequency: daily  Duration: continuous  Aggravating factors: Induced by position change: lying supine, rolling to the right, and sit to stand and Induced by motion: looking up at the ceiling, turning body quickly, and turning head quickly  Relieving factors: head stationary and closing eyes  Progression of symptoms: better  OCULOMOTOR EXAM:  Ocular Alignment: normal  Ocular ROM: No Limitations  Spontaneous Nystagmus: absent  Gaze-Induced Nystagmus: absent  Smooth Pursuits: intact  Saccades: intact  Convergence/Divergence: 4 cm  VESTIBULAR  - OCULAR REFLEX:   Slow VOR: na  VOR Cancellation: na  Head-Impulse Test: na  Dynamic Visual Acuity: na   POSITIONAL TESTING: Right Dix-Hallpike: no nystagmus Left Dix-Hallpike: no nystagmus Right Roll Test: apogeotropic nystagmus and symptoms worse on right side Left Roll Test: no nystagmus  MOTION SENSITIVITY:  Motion Sensitivity Quotient Intensity: 0 = none, 1 = Lightheaded, 2 = Mild, 3 = Moderate, 4 = Severe, 5 = Vomiting  Intensity  1. Sitting to supine 4  2. Supine to L side 1  3. Supine to R side 4  4. Supine to sitting 3  5. L Hallpike-Dix 0  6. Up from L  0  7. R Hallpike-Dix 0  8. Up from R  3  9. Sitting, head tipped to L knee   10. Head up from L knee   11. Sitting, head tipped to R knee   12. Head up from R knee   13. Sitting head turns x5   14.Sitting head nods x5   15. In stance, 180 turn to L    16. In stance, 180 turn to R     OTHOSTATICS: not done  FUNCTIONAL GAIT: na today   VESTIBULAR TREATMENT:                                                                                                   DATE: 02/27/23  Canalith Repositioning:  BBQ Roll Right: Number of Reps: 2 and Response to Treatment: symptoms improved Gaze Adaptation:na   Habituation:na  PATIENT EDUCATION: Education details: POC, goals, expected outcomes, Person educated: Patient Education method: Explanation, Demonstration, Tactile cues, and Verbal cues Education comprehension: verbalized understanding, returned demonstration, verbal cues required, and needs further education  HOME EXERCISE PROGRAM:  GOALS: Goals reviewed with patient? Yes  SHORT TERM GOALS: Target date: 03/13/23  I in home program to manage vertigo Baseline: Goal status: INITIAL     LONG TERM GOALS: Target date: 8 weeks, 04/24/23  Improve DGI to 20 Baseline: 58 Goal status: INITIAL  2.  Complete DGI, score greater than19/24 for indication of reduced fall risk Baseline: NA at eval Goal status:  INITIAL  3.  Gait speed, increase to 1 m/sec or faster to align with  value concordant with efficient speed for community ambulation and safety Baseline: .63m/sec Goal status: INITIAL   ASSESSMENT:  CLINICAL IMPRESSION: Patient is a 77 y.o. female who was seen today for physical therapy evaluation and treatment for vertigo. She has a history of vertigo several years ago with resolution.  Today she had consistent c/o and positive clinical signs of R horizontal canalithiasis. She had some improvement from Rx today although not completely resolved.  Does have compromised neck movement, which may have influenced efficacy of treatment .  Would recommend ongoing skilled PT to address her vertigo,also at some point testing for VOR. She has been attending another clinic for her neck pain and stiffness, unclear whether she has future scheduled appts with them.   OBJECTIVE IMPAIRMENTS: decreased activity tolerance, dizziness, hypomobility, impaired flexibility, and pain.   ACTIVITY LIMITATIONS: bending, sleeping, transfers, bed mobility, and reach over head  PARTICIPATION LIMITATIONS: meal prep, cleaning, laundry, driving, community activity, and yard work  PERSONAL FACTORS: Behavior pattern, Past/current experiences, Time since onset of injury/illness/exacerbation, and 3+ comorbidities: Dm, HTN, anemia  are also affecting patient's functional outcome.   REHAB POTENTIAL: Good  CLINICAL DECISION MAKING: Evolving/moderate complexity  EVALUATION COMPLEXITY: Low   PLAN:  PT FREQUENCY: 1-2x/week  PT DURATION: 8 weeks  PLANNED INTERVENTIONS: Therapeutic exercises, Therapeutic activity, Neuromuscular re-education, Balance training, Gait training, Patient/Family education, Self Care, Joint mobilization, Vestibular training, and Canalith repositioning  PLAN FOR NEXT SESSION: how was response to initial Rx session, retest for BPPV, assess VOR when able. Eventually assess DGI    L ,  PT 02/27/2023, 12:44 PM

## 2023-02-27 NOTE — Patient Instructions (Signed)

## 2023-03-01 DIAGNOSIS — H11232 Symblepharon, left eye: Secondary | ICD-10-CM | POA: Insufficient documentation

## 2023-03-01 DIAGNOSIS — H25813 Combined forms of age-related cataract, bilateral: Secondary | ICD-10-CM | POA: Insufficient documentation

## 2023-03-05 ENCOUNTER — Ambulatory Visit: Payer: Medicare Other | Admitting: Physical Therapy

## 2023-03-05 NOTE — Addendum Note (Signed)
Addended byMaxcine Ham,  L on: 03/05/2023 09:30 AM   Modules accepted: Orders

## 2023-03-08 ENCOUNTER — Encounter: Payer: Self-pay | Admitting: Physical Therapy

## 2023-03-08 ENCOUNTER — Ambulatory Visit: Payer: Medicare Other | Attending: Physician Assistant | Admitting: Physical Therapy

## 2023-03-08 DIAGNOSIS — H8111 Benign paroxysmal vertigo, right ear: Secondary | ICD-10-CM | POA: Diagnosis present

## 2023-03-08 DIAGNOSIS — M542 Cervicalgia: Secondary | ICD-10-CM | POA: Diagnosis present

## 2023-03-08 DIAGNOSIS — R42 Dizziness and giddiness: Secondary | ICD-10-CM | POA: Diagnosis present

## 2023-03-08 NOTE — Therapy (Signed)
OUTPATIENT PHYSICAL THERAPY VESTIBULAR TREATMENT     Patient Name: Kristen Murray MRN: [id number] DOB:1946-04-18, 77 y.o., female Today's Date: 03/08/2023  END OF SESSION:  PT End of Session - 03/08/23 1659     Visit Number 2    Date for PT Re-Evaluation 04/24/23    Progress Note Due on Visit 10    PT Start Time 1700    PT Stop Time 1730    PT Time Calculation (min) 30 min    Activity Tolerance Patient tolerated treatment well    Behavior During Therapy [facility] for tasks assessed/performed             Past Medical History:  Diagnosis Date   Arthritis    Borderline abnormal TFTs    as a teen   CAD (coronary artery disease)    Coronary CTA 3/22: Calcium score 1 (38th percentile), minimal nonobstructive CAD with 0-24% in mid RCA; aortic atherosclerosis   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   Nephrolithiasis    Dr Logan Bores, [facility], Hx of [3][   Polyp of colon    Radiculopathy    Past Surgical History:  Procedure Laterality Date   ABDOMINAL HYSTERECTOMY     TAH/BSO --fibroids, no cancer   BALLOON DILATION N/A 07/20/2014   Procedure: BALLOON DILATION;  Surgeon: Charolett Bumpers, MD;  Location: [facility];  Service: Endoscopy;  Laterality: N/A;   BREAST BIOPSY     Right   CARPAL TUNNEL RELEASE     & trigger thumb RUE; Dr Chaney Malling   COLONOSCOPY W/ POLYPECTOMY  07/2004   Neg 2011;  Dr Danise Edge   CYSTOSCOPY  11/2009   Neg   ESOPHAGOGASTRODUODENOSCOPY N/A 07/20/2014   Procedure: ESOPHAGOGASTRODUODENOSCOPY (EGD);  Surgeon: Charolett Bumpers, MD;  Location: [facility];  Service: Endoscopy;  Laterality: N/A;   FLEXIBLE BRONCHOSCOPY W/ UPPER ENDOSCOPY     neg   KNEE ARTHROSCOPY     Left   ROTATOR CUFF REPAIR     R shoulder   TONSILLECTOMY     TRIGGER FINGER RELEASE Right    Right Thumb   Patient Active Problem List   Diagnosis Date Noted   Mitral regurgitation 11/10/2022   Shortness of breath 10/04/2022   Allergic rhinitis due to pollen 05/29/2022   Cervical spondylosis without myelopathy 05/29/2022   Hyperglycemia due to type 2 diabetes mellitus (HCC) 05/29/2022   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   Perennial allergic rhinitis 05/29/2022   Pure hypercholesterolemia 05/29/2022   Simple chronic conjunctivitis 05/29/2022   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   Partial thickness rotator cuff tear 03/17/2022   Dysuria 02/28/2022   CAD (coronary artery disease)    Myelodysplasia, 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   Spinal stenosis of lumbar region 07/30/2020   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   Bilateral knee pain 05/11/2020  Atrial flutter (HCC) 01/29/2020   A-fib (HCC) 01/27/2020   Atrial flutter, paroxysmal (HCC) 01/26/2020   Lumbar spondylosis 10/27/2019   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   Iron deficiency anemia due to chronic blood loss 07/18/2019   Closed fracture of proximal phalanx of great toe 09/02/2018   Nuclear sclerosis of both eyes 08/20/2017   High risk medication use 06/15/2017   Recurrent urinary tract infection 10/14/2015   Pain in the chest    Chest pain at rest 11/26/2014   Shingles 05/14/2014   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   Vaginal atrophy 10/16/2012   Menopause 10/16/2012   Chest pain, atypical 02/19/2012   Type 2 diabetes mellitus with neurological manifestations, 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/19/2012   Migraine 01/09/2012    Interstitial cystitis (chronic) without hematuria 12/20/2011   Cervical stenosis of spinal canal 08/28/2011   Meningioma (HCC) 08/28/2011   COSTOCHONDRITIS 11/16/2010   DISTURBANCES OF SENSATION OF SMELL AND TASTE 04/05/2010   NEPHROLITHIASIS, HX OF 02/08/2010   EDEMA 10/01/2009   Brachial neuritis or radiculitis NOS 06/14/2009   EXTERNAL HEMORRHOIDS 05/25/2009   MUSCLE PAIN 05/17/2009   Hyperlipidemia 04/23/2009   ABNORMAL THYROID FUNCTION TESTS 04/23/2009   MENINGIOMA 12/26/2007   HEADACHE 12/26/2007   RADICULOPATHY 04/02/2007   Essential hypertension 11/21/2006   GERD 11/21/2006    PCP: Eliane Decree, PA REFERRING PROVIDER: Eliane Decree, PA  REFERRING DIAG: vertigo  THERAPY DIAG:  BPPV (benign paroxysmal positional vertigo), right  ONSET DATE: 4 weeks   Rationale for Evaluation and Treatment: Rehabilitation  SUBJECTIVE:   SUBJECTIVE STATEMENT: Still dizzy, maybe a little better.  Pt accompanied by: self  PERTINENT HISTORY: woke up 4 weeks ago with vertigo Sx  PAIN:  Are you having pain? Yes: NPRS scale: 3/10 Pain location: upper traps, post c spine Pain description: continuous, chronic Aggravating factors: excessive movement particularly with looking down Relieving factors: heat, ice, meds  PRECAUTIONS: None  WEIGHT BEARING RESTRICTIONS: No  FALLS: Has patient fallen in last 6 months? No  LIVING ENVIRONMENT: Lives with: lives with their spouse Lives in: House/apartment Stairs: Yes: Internal: 11 steps; on right going up and External: 4 steps; on left going up Has following equipment at home: Single point cane  PLOF: Independent  PATIENT GOALS: get rid of dizziness, be able to put my eye drops in without the dizziness  OBJECTIVE:   DIAGNOSTIC FINDINGS: na  COGNITION: Overall cognitive status: Within functional limits for tasks assessed   SENSATION: WFL    MUSCLE TONE: wnl   POSTURE:  increased thoracic kyphosis  Cervical ROM:     Active A/PROM (deg) eval  Flexion 25  Extension 20  Right lateral flexion   Left lateral flexion   Right rotation 40  Left rotation 40  (Blank rows = not tested)  STRENGTH: wfl Ue's   LOWER EXTREMITY MMT: WFL LE's  Sit to supine Min A  TRANSFERS: Assistive device utilized: None  Sit to stand: Complete Independence Stand to sit: Complete Independence Chair to chair: Complete Independence Floor:  na   GAIT: Gait pattern: step through pattern, decreased arm swing- Right, decreased arm swing- Left, decreased step length- Right, decreased step length- Left, decreased stride length, Right foot flat, and Left foot flat Distance walked: in clinic, over  53' at a time Assistive device utilized: None Level of assistance: Complete Independence Comments: slowed gait speed  FUNCTIONAL TESTS:  Not performed today  PATIENT SURVEYS:  DHI 58 ,   VESTIBULAR ASSESSMENT:  GENERAL OBSERVATION: healthy and pleasant female,  neck guarded with functional movements, stiff   SYMPTOM BEHAVIOR:  Subjective history: 4 week h/o o sudden onset vertigo, nausea, no head trauma, had allergies.  Non-Vestibular symptoms: neck pain, headaches, and nausea/vomiting  Type of dizziness: Imbalance (Disequilibrium) and Unsteady with head/body turns  Frequency: daily  Duration: continuous  Aggravating factors: Induced by position change: lying supine, rolling to the right, and sit to stand and Induced by motion: looking up at the ceiling, turning body quickly, and turning head quickly  Relieving factors: head stationary and closing eyes  Progression of symptoms: better  OCULOMOTOR EXAM:  Ocular Alignment: normal  Ocular ROM: No Limitations  Spontaneous Nystagmus: absent  Gaze-Induced Nystagmus: absent  Smooth Pursuits: intact  Saccades: intact  Convergence/Divergence: 4 cm  VESTIBULAR - OCULAR REFLEX:   Slow VOR: na  VOR Cancellation: na  Head-Impulse Test: na  Dynamic Visual Acuity:  na   POSITIONAL TESTING: Right Dix-Hallpike: no nystagmus Left Dix-Hallpike: no nystagmus Right Roll Test: apogeotropic nystagmus and symptoms worse on right side Left Roll Test: no nystagmus  MOTION SENSITIVITY:  Motion Sensitivity Quotient Intensity: 0 = none, 1 = Lightheaded, 2 = Mild, 3 = Moderate, 4 = Severe, 5 = Vomiting  Intensity  1. Sitting to supine 4  2. Supine to L side 1  3. Supine to R side 4  4. Supine to sitting 3  5. L Hallpike-Dix 0  6. Up from L  0  7. R Hallpike-Dix 0  8. Up from R  3  9. Sitting, head tipped to L knee   10. Head up from L knee   11. Sitting, head tipped to R knee   12. Head up from R knee   13. Sitting head turns x5   14.Sitting head nods x5   15. In stance, 180 turn to L    16. In stance, 180 turn to R     OTHOSTATICS: not done  FUNCTIONAL GAIT: na today   VESTIBULAR TREATMENT:                                                                                                   DATE:   03/08/23 Canalith Repositioning:   Epley Right: Number of Reps: 1 and Response to Treatment: symptoms resolved  02/27/23 Canalith Repositioning:  BBQ Roll Right: Number of Reps: 2 and Response to Treatment: symptoms improved Gaze Adaptation:na   Habituation:na  PATIENT EDUCATION: Education details: avoid bending over today, be careful with balance, may feel dizzy for a while Person educated: Patient Education method: Explanation Education comprehension: verbalized understanding  HOME EXERCISE PROGRAM:  GOALS: Goals reviewed with patient? Yes  SHORT TERM GOALS: Target date: 03/13/23  I in home program to manage vertigo Baseline: Goal status: IN PROGRESS     LONG TERM GOALS: Target date: 8 weeks, 04/24/23  Improve DGI to 20 Baseline: 58 Goal status: INITIAL  2.  Complete DGI, score greater than19/24 for indication of reduced fall risk Baseline: NA at eval Goal status: INITIAL  3.  Gait speed, increase to 1 m/sec or faster to align  with value concordant  with efficient speed for community ambulation and safety Baseline: .4m/sec Goal status: INITIAL   ASSESSMENT:  CLINICAL IMPRESSION: Kristen Murray reports continued dizziness.  Retesting of canals demonstrated R horizontal canalithiasis, after Epley maneuver retested with resolution of symptoms.  Patient was unsteady afterwards, so escorted to waiting room to rest until symptoms improved, with instructions to be cautious for remainder of day and avoid large head movements and bending over.  Kristen Murray continues to demonstrate potential for improvement and would benefit from continued skilled therapy to address impairments.      OBJECTIVE IMPAIRMENTS: decreased activity tolerance, dizziness, hypomobility, impaired flexibility, and pain.   ACTIVITY LIMITATIONS: bending, sleeping, transfers, bed mobility, and reach over head  PARTICIPATION LIMITATIONS: meal prep, cleaning, laundry, driving, community activity, and yard work  PERSONAL FACTORS: Behavior pattern, Past/current experiences, Time since onset of injury/illness/exacerbation, and 3+ comorbidities: Dm, HTN, anemia  are also affecting patient's functional outcome.   REHAB POTENTIAL: Good  CLINICAL DECISION MAKING: Evolving/moderate complexity  EVALUATION COMPLEXITY: Low   PLAN:  PT FREQUENCY: 1-2x/week  PT DURATION: 8 weeks  PLANNED INTERVENTIONS: Therapeutic exercises, Therapeutic activity, Neuromuscular re-education, Balance training, Gait training, Patient/Family education, Self Care, Joint mobilization, Vestibular training, and Canalith repositioning  PLAN FOR NEXT SESSION: how was response to initial Rx session, retest for BPPV, assess VOR when able. Eventually assess DGI   Jena Gauss, PT, DPT  03/08/2023, 5:38 PM

## 2023-03-12 ENCOUNTER — Ambulatory Visit: Payer: Medicare Other | Admitting: Physical Therapy

## 2023-03-12 ENCOUNTER — Encounter: Payer: Self-pay | Admitting: Physical Therapy

## 2023-03-12 DIAGNOSIS — H8111 Benign paroxysmal vertigo, right ear: Secondary | ICD-10-CM

## 2023-03-12 NOTE — Therapy (Signed)
OUTPATIENT PHYSICAL THERAPY VESTIBULAR TREATMENT     Patient Name: Kristen Murray MRN: [id number] DOB:1945/12/02, 77 y.o., female Today's Date: 03/12/2023  END OF SESSION:  PT End of Session - 03/12/23 1409     Visit Number 3    Date for PT Re-Evaluation 04/24/23    Progress Note Due on Visit 10    PT Start Time 1405    PT Stop Time 1447    PT Time Calculation (min) 42 min    Activity Tolerance Patient tolerated treatment well    Behavior During Therapy [facility] for tasks assessed/performed             Past Medical History:  Diagnosis Date   Arthritis    Borderline abnormal TFTs    as a teen   CAD (coronary artery disease)    Coronary CTA 3/22: Calcium score 1 (38th percentile), minimal nonobstructive CAD with 0-24% in mid RCA; aortic atherosclerosis   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   Nephrolithiasis    Dr Logan Bores, [facility], Hx of [3][   Polyp of colon    Radiculopathy    Past Surgical History:  Procedure Laterality Date   ABDOMINAL HYSTERECTOMY     TAH/BSO --fibroids, no cancer   BALLOON DILATION N/A 07/20/2014   Procedure: BALLOON DILATION;  Surgeon: Charolett Bumpers, MD;  Location: [facility];  Service: Endoscopy;  Laterality: N/A;   BREAST BIOPSY     Right   CARPAL TUNNEL RELEASE     & trigger thumb RUE; Dr Chaney Malling   COLONOSCOPY W/ POLYPECTOMY  07/2004   Neg 2011;  Dr Danise Edge   CYSTOSCOPY  11/2009   Neg   ESOPHAGOGASTRODUODENOSCOPY N/A 07/20/2014   Procedure: ESOPHAGOGASTRODUODENOSCOPY (EGD);  Surgeon: Charolett Bumpers, MD;  Location: [facility];  Service: Endoscopy;  Laterality: N/A;   FLEXIBLE BRONCHOSCOPY W/ UPPER ENDOSCOPY     neg   KNEE ARTHROSCOPY     Left   ROTATOR CUFF REPAIR     R shoulder   TONSILLECTOMY     TRIGGER FINGER RELEASE Right    Right Thumb   Patient Active Problem List   Diagnosis Date Noted   Mitral regurgitation 11/10/2022   Shortness of breath 10/04/2022   Allergic rhinitis due to pollen 05/29/2022   Cervical spondylosis without myelopathy 05/29/2022   Hyperglycemia due to type 2 diabetes mellitus (HCC) 05/29/2022   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   Perennial allergic rhinitis 05/29/2022   Pure hypercholesterolemia 05/29/2022   Simple chronic conjunctivitis 05/29/2022   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   Partial thickness rotator cuff tear 03/17/2022   Dysuria 02/28/2022   CAD (coronary artery disease)    Myelodysplasia, 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   Spinal stenosis of lumbar region 07/30/2020   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   Bilateral knee pain 05/11/2020  Atrial flutter (HCC) 01/29/2020   A-fib (HCC) 01/27/2020   Atrial flutter, paroxysmal (HCC) 01/26/2020   Lumbar spondylosis 10/27/2019   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   Iron deficiency anemia due to chronic blood loss 07/18/2019   Closed fracture of proximal phalanx of great toe 09/02/2018   Nuclear sclerosis of both eyes 08/20/2017   High risk medication use 06/15/2017   Recurrent urinary tract infection 10/14/2015   Pain in the chest    Chest pain at rest 11/26/2014   Shingles 05/14/2014   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   Vaginal atrophy 10/16/2012   Menopause 10/16/2012   Chest pain, atypical 02/19/2012   Type 2 diabetes mellitus with neurological manifestations, 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/19/2012   Migraine 01/09/2012    Interstitial cystitis (chronic) without hematuria 12/20/2011   Cervical stenosis of spinal canal 08/28/2011   Meningioma (HCC) 08/28/2011   COSTOCHONDRITIS 11/16/2010   DISTURBANCES OF SENSATION OF SMELL AND TASTE 04/05/2010   NEPHROLITHIASIS, HX OF 02/08/2010   EDEMA 10/01/2009   Brachial neuritis or radiculitis NOS 06/14/2009   EXTERNAL HEMORRHOIDS 05/25/2009   MUSCLE PAIN 05/17/2009   Hyperlipidemia 04/23/2009   ABNORMAL THYROID FUNCTION TESTS 04/23/2009   MENINGIOMA 12/26/2007   HEADACHE 12/26/2007   RADICULOPATHY 04/02/2007   Essential hypertension 11/21/2006   GERD 11/21/2006    PCP: Eliane Decree, PA REFERRING PROVIDER: Eliane Decree, PA  REFERRING DIAG: vertigo  THERAPY DIAG:  BPPV (benign paroxysmal positional vertigo), right  ONSET DATE: 4 weeks   Rationale for Evaluation and Treatment: Rehabilitation  SUBJECTIVE:   SUBJECTIVE STATEMENT: Hasn't had any episodes of room spinning Saturday or Sunday.  Never realized how much bend over during the day until this happened.  Neck is really sore, didn't put any aspircreme on it.   Pt accompanied by: self  PERTINENT HISTORY: woke up 4 weeks ago with vertigo Sx  PAIN:  Are you having pain? Yes: NPRS scale: 8/10 Pain location: upper traps, post c spine Pain description: continuous, chronic Aggravating factors: excessive movement particularly with looking down Relieving factors: heat, ice, meds  PRECAUTIONS: None  WEIGHT BEARING RESTRICTIONS: No  FALLS: Has patient fallen in last 6 months? No  LIVING ENVIRONMENT: Lives with: lives with their spouse Lives in: House/apartment Stairs: Yes: Internal: 11 steps; on right going up and External: 4 steps; on left going up Has following equipment at home: Single point cane  PLOF: Independent  PATIENT GOALS: get rid of dizziness, be able to put my eye drops in without the dizziness  OBJECTIVE:   DIAGNOSTIC FINDINGS: na  COGNITION: Overall cognitive status:  Within functional limits for tasks assessed   SENSATION: WFL    MUSCLE TONE: wnl   POSTURE:  increased thoracic kyphosis  Cervical ROM:    Active A/PROM (deg) eval  Flexion 25  Extension 20  Right lateral flexion   Left lateral flexion   Right rotation 40  Left rotation 40  (Blank rows = not tested)  STRENGTH: wfl Ue's   LOWER EXTREMITY MMT: WFL LE's  Sit to supine Min A  TRANSFERS: Assistive device utilized: None  Sit to stand: Complete Independence Stand to sit: Complete Independence Chair to chair: Complete Independence Floor:  na   GAIT: Gait pattern: step through pattern, decreased arm swing- Right, decreased arm swing- Left, decreased step length- Right, decreased step length- Left, decreased stride length, Right foot flat, and Left foot flat Distance walked: in clinic, over  22' at a time Assistive device utilized: None Level of assistance: Complete Independence  Comments: slowed gait speed  FUNCTIONAL TESTS:  Not performed today  PATIENT SURVEYS:  DHI 58 ,   VESTIBULAR ASSESSMENT:  GENERAL OBSERVATION: healthy and pleasant female, neck guarded with functional movements, stiff   SYMPTOM BEHAVIOR:  Subjective history: 4 week h/o o sudden onset vertigo, nausea, no head trauma, had allergies.  Non-Vestibular symptoms: neck pain, headaches, and nausea/vomiting  Type of dizziness: Imbalance (Disequilibrium) and Unsteady with head/body turns  Frequency: daily  Duration: continuous  Aggravating factors: Induced by position change: lying supine, rolling to the right, and sit to stand and Induced by motion: looking up at the ceiling, turning body quickly, and turning head quickly  Relieving factors: head stationary and closing eyes  Progression of symptoms: better  OCULOMOTOR EXAM:  Ocular Alignment: normal  Ocular ROM: No Limitations  Spontaneous Nystagmus: absent  Gaze-Induced Nystagmus: absent  Smooth Pursuits: intact  Saccades:  intact  Convergence/Divergence: 4 cm  VESTIBULAR - OCULAR REFLEX:   Slow VOR: na  VOR Cancellation: na  Head-Impulse Test: na  Dynamic Visual Acuity: na   POSITIONAL TESTING: Right Dix-Hallpike: no nystagmus Left Dix-Hallpike: no nystagmus Right Roll Test: apogeotropic nystagmus and symptoms worse on right side Left Roll Test: no nystagmus  MOTION SENSITIVITY:  Motion Sensitivity Quotient Intensity: 0 = none, 1 = Lightheaded, 2 = Mild, 3 = Moderate, 4 = Severe, 5 = Vomiting  Intensity  1. Sitting to supine 4  2. Supine to L side 1  3. Supine to R side 4  4. Supine to sitting 3  5. L Hallpike-Dix 0  6. Up from L  0  7. R Hallpike-Dix 0  8. Up from R  3  9. Sitting, head tipped to L knee   10. Head up from L knee   11. Sitting, head tipped to R knee   12. Head up from R knee   13. Sitting head turns x5   14.Sitting head nods x5   15. In stance, 180 turn to L    16. In stance, 180 turn to R     OTHOSTATICS: not done  FUNCTIONAL GAIT: na today   VESTIBULAR TREATMENT:                                                                                                   DATE:  03/12/23 Neuromuscular Reeducation: .  Postural Reeducation: Seated Shoulder Rolls   10 reps Seated Cervical Retraction  10 reps Seated Scapular Retraction  10 reps Gentle Levator Scapulae Stretch  3 reps - 10-15 sec hold Retesting Canals: negative  Manual Therapy: to decrease muscle spasm and pain and improve mobility.  STM/TPR to cervical paraspinals, SCM, levators, PA mobs cervical spine.   03/08/23 Canalith Repositioning:   Epley Right: Number of Reps: 1 and Response to Treatment: symptoms resolved  02/27/23 Canalith Repositioning:  BBQ Roll Right: Number of Reps: 2 and Response to Treatment: symptoms improved Gaze Adaptation:na   Habituation:na  PATIENT EDUCATION: Education details: avoid bending over today, be careful with balance, may feel dizzy for a while Person educated:  Patient Education method: Explanation Education  comprehension: verbalized understanding  HOME EXERCISE PROGRAM: Access Code: [email or web address] URL: [email or web address] Date: 03/12/2023 Prepared by: Harrie Foreman  Exercises - Seated Shoulder Rolls  - 3 x daily - 7 x weekly - 1 sets - 10 reps - Seated Cervical Retraction  - 3 x daily - 7 x weekly - 1 sets - 10 reps - Seated Scapular Retraction  - 3 x daily - 7 x weekly - 1 sets - 10 reps - Gentle Levator Scapulae Stretch  - 3 x daily - 7 x weekly - 1 sets - 3 reps - 10-15 sec hold   GOALS: Goals reviewed with patient? Yes  SHORT TERM GOALS: Target date: 03/13/23  I in home program to manage vertigo Baseline: Goal status: IN PROGRESS     LONG TERM GOALS: Target date: 8 weeks, 04/24/23  Improve DGI to 20 Baseline: 58 Goal status: INITIAL  2.  Complete DGI, score greater than19/24 for indication of reduced fall risk Baseline: NA at eval Goal status: INITIAL  3.  Gait speed, increase to 1 m/sec or faster to align with value concordant with efficient speed for community ambulation and safety Baseline: .42m/sec Goal status: INITIAL   ASSESSMENT:  CLINICAL IMPRESSION: Cleotilde Neer reports significant improvement in dizziness and was negative for canalithiasis today.  She has significant impairments in posture and was complaining of neck pain, given HEP for posture then focused on manual therapy to address any cervicogenic component of dizziness.  Cleotilde Neer continues to demonstrate potential for improvement and would benefit from continued skilled therapy to address impairments.      OBJECTIVE IMPAIRMENTS: decreased activity tolerance, dizziness, hypomobility, impaired flexibility, and pain.   ACTIVITY LIMITATIONS: bending, sleeping, transfers, bed mobility, and reach over head  PARTICIPATION LIMITATIONS: meal prep, cleaning, laundry, driving, community activity, and yard work  PERSONAL FACTORS: Behavior  pattern, Past/current experiences, Time since onset of injury/illness/exacerbation, and 3+ comorbidities: Dm, HTN, anemia  are also affecting patient's functional outcome.   REHAB POTENTIAL: Good  CLINICAL DECISION MAKING: Evolving/moderate complexity  EVALUATION COMPLEXITY: Low   PLAN:  PT FREQUENCY: 1-2x/week  PT DURATION: 8 weeks  PLANNED INTERVENTIONS: Therapeutic exercises, Therapeutic activity, Neuromuscular re-education, Balance training, Gait training, Patient/Family education, Self Care, Joint mobilization, Vestibular training, and Canalith repositioning  PLAN FOR NEXT SESSION: retest for BPPV if needed, assess VOR when able. Eventually assess DGI   Jena Gauss, PT, DPT  03/12/2023, 2:54 PM

## 2023-03-14 ENCOUNTER — Ambulatory Visit: Payer: Medicare Other | Admitting: Physical Therapy

## 2023-03-14 DIAGNOSIS — R42 Dizziness and giddiness: Secondary | ICD-10-CM

## 2023-03-14 DIAGNOSIS — H8111 Benign paroxysmal vertigo, right ear: Secondary | ICD-10-CM | POA: Diagnosis not present

## 2023-03-14 DIAGNOSIS — M542 Cervicalgia: Secondary | ICD-10-CM

## 2023-03-14 NOTE — Therapy (Signed)
OUTPATIENT PHYSICAL THERAPY VESTIBULAR TREATMENT     Patient Name: Kristen Murray MRN: [id number] DOB:12/28/1945, 77 y.o., female Today's Date: 03/14/2023  END OF SESSION:  PT End of Session - 03/14/23 1058     Visit Number 4    Date for PT Re-Evaluation 04/24/23    Progress Note Due on Visit 10    PT Start Time 1100    PT Stop Time 1145    PT Time Calculation (min) 45 min    Activity Tolerance Patient tolerated treatment well    Behavior During Therapy [facility] for tasks assessed/performed             Past Medical History:  Diagnosis Date   Arthritis    Borderline abnormal TFTs    as a teen   CAD (coronary artery disease)    Coronary CTA 3/22: Calcium score 1 (38th percentile), minimal nonobstructive CAD with 0-24% in mid RCA; aortic atherosclerosis   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   Nephrolithiasis    Dr Logan Bores, [facility], Hx of [3][   Polyp of colon    Radiculopathy    Past Surgical History:  Procedure Laterality Date   ABDOMINAL HYSTERECTOMY     TAH/BSO --fibroids, no cancer   BALLOON DILATION N/A 07/20/2014   Procedure: BALLOON DILATION;  Surgeon: Charolett Bumpers, MD;  Location: [facility];  Service: Endoscopy;  Laterality: N/A;   BREAST BIOPSY     Right   CARPAL TUNNEL RELEASE     & trigger thumb RUE; Dr Chaney Malling   COLONOSCOPY W/ POLYPECTOMY  07/2004   Neg 2011;  Dr Danise Edge   CYSTOSCOPY  11/2009   Neg   ESOPHAGOGASTRODUODENOSCOPY N/A 07/20/2014   Procedure: ESOPHAGOGASTRODUODENOSCOPY (EGD);  Surgeon: Charolett Bumpers, MD;  Location: [facility];  Service: Endoscopy;  Laterality: N/A;   FLEXIBLE BRONCHOSCOPY W/ UPPER ENDOSCOPY     neg   KNEE ARTHROSCOPY     Left   ROTATOR CUFF REPAIR     R shoulder   TONSILLECTOMY     TRIGGER FINGER RELEASE Right    Right Thumb   Patient Active Problem List   Diagnosis Date Noted   Mitral regurgitation 11/10/2022   Shortness of breath 10/04/2022   Allergic rhinitis due to pollen 05/29/2022   Cervical spondylosis without myelopathy 05/29/2022   Hyperglycemia due to type 2 diabetes mellitus (HCC) 05/29/2022   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   Perennial allergic rhinitis 05/29/2022   Pure hypercholesterolemia 05/29/2022   Simple chronic conjunctivitis 05/29/2022   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   Partial thickness rotator cuff tear 03/17/2022   Dysuria 02/28/2022   CAD (coronary artery disease)    Myelodysplasia, 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   Spinal stenosis of lumbar region 07/30/2020   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   Bilateral knee pain 05/11/2020  Atrial flutter (HCC) 01/29/2020   A-fib (HCC) 01/27/2020   Atrial flutter, paroxysmal (HCC) 01/26/2020   Lumbar spondylosis 10/27/2019   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   Iron deficiency anemia due to chronic blood loss 07/18/2019   Closed fracture of proximal phalanx of great toe 09/02/2018   Nuclear sclerosis of both eyes 08/20/2017   High risk medication use 06/15/2017   Recurrent urinary tract infection 10/14/2015   Pain in the chest    Chest pain at rest 11/26/2014   Shingles 05/14/2014   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   Vaginal atrophy 10/16/2012   Menopause 10/16/2012   Chest pain, atypical 02/19/2012   Type 2 diabetes mellitus with neurological manifestations, 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/19/2012   Migraine 01/09/2012    Interstitial cystitis (chronic) without hematuria 12/20/2011   Cervical stenosis of spinal canal 08/28/2011   Meningioma (HCC) 08/28/2011   COSTOCHONDRITIS 11/16/2010   DISTURBANCES OF SENSATION OF SMELL AND TASTE 04/05/2010   NEPHROLITHIASIS, HX OF 02/08/2010   EDEMA 10/01/2009   Brachial neuritis or radiculitis NOS 06/14/2009   EXTERNAL HEMORRHOIDS 05/25/2009   MUSCLE PAIN 05/17/2009   Hyperlipidemia 04/23/2009   ABNORMAL THYROID FUNCTION TESTS 04/23/2009   MENINGIOMA 12/26/2007   HEADACHE 12/26/2007   RADICULOPATHY 04/02/2007   Essential hypertension 11/21/2006   GERD 11/21/2006    PCP: Eliane Decree, PA REFERRING PROVIDER: Eliane Decree, PA  REFERRING DIAG: vertigo  THERAPY DIAG:  BPPV (benign paroxysmal positional vertigo), right  Cervicalgia  Dizziness and giddiness  ONSET DATE: 4 weeks   Rationale for Evaluation and Treatment: Rehabilitation  SUBJECTIVE:   SUBJECTIVE STATEMENT: A little dizzy today- feels more unsteady, like she's been drinking.  She has not had any dizziness with laying down or rolling over.  Neck is hurting today, but she has been doing her exercises.  Pt accompanied by: self  PERTINENT HISTORY: woke up 4 weeks ago with vertigo Sx  PMH: migraines, arthritis, neck pain, L knee OA, anemia.   PAIN:  Are you having pain? Yes: NPRS scale: 7/10 Pain location: upper traps, post c spine Pain description: continuous, chronic Aggravating factors: excessive movement particularly with looking down Relieving factors: heat, ice, meds  PRECAUTIONS: None  WEIGHT BEARING RESTRICTIONS: No  FALLS: Has patient fallen in last 6 months? No  LIVING ENVIRONMENT: Lives with: lives with their spouse Lives in: House/apartment Stairs: Yes: Internal: 11 steps; on right going up and External: 4 steps; on left going up Has following equipment at home: Single point cane  PLOF: Independent  PATIENT GOALS: get rid of dizziness, be able to put my eye drops  in without the dizziness  OBJECTIVE:   DIAGNOSTIC FINDINGS: na  COGNITION: Overall cognitive status: Within functional limits for tasks assessed   SENSATION: WFL    MUSCLE TONE: wnl   POSTURE:  increased thoracic kyphosis  Cervical ROM:    Active A/PROM (deg) eval  Flexion 25  Extension 20  Right lateral flexion   Left lateral flexion   Right rotation 40  Left rotation 40  (Blank rows = not tested)  STRENGTH: wfl Ue's   LOWER EXTREMITY MMT: WFL LE's  Sit to supine Min A  TRANSFERS: Assistive device utilized: None  Sit to stand: Complete Independence Stand to sit: Complete Independence Chair to chair: Complete Independence Floor:  na   GAIT: Gait pattern: step through pattern, decreased arm swing- Right, decreased arm swing- Left, decreased step length- Right, decreased step length- Left, decreased stride length, Right foot flat, and Left foot flat Distance  walked: in clinic, over  52' at a time Assistive device utilized: None Level of assistance: Complete Independence Comments: slowed gait speed  FUNCTIONAL TESTS:  Not performed today  PATIENT SURVEYS:  DHI 58 ,   VESTIBULAR ASSESSMENT:  GENERAL OBSERVATION: healthy and pleasant female, neck guarded with functional movements, stiff   SYMPTOM BEHAVIOR:  Subjective history: 4 week h/o o sudden onset vertigo, nausea, no head trauma, had allergies.  Non-Vestibular symptoms: neck pain, headaches, and nausea/vomiting  Type of dizziness: Imbalance (Disequilibrium) and Unsteady with head/body turns  Frequency: daily  Duration: continuous  Aggravating factors: Induced by position change: lying supine, rolling to the right, and sit to stand and Induced by motion: looking up at the ceiling, turning body quickly, and turning head quickly  Relieving factors: head stationary and closing eyes  Progression of symptoms: better  OCULOMOTOR EXAM:  Ocular Alignment: normal  Ocular ROM: No  Limitations  Spontaneous Nystagmus: absent  Gaze-Induced Nystagmus: absent  Smooth Pursuits: saccades  Saccades: hypermetric/overshoots  Convergence/Divergence: 4 cm   VESTIBULAR - OCULAR REFLEX:   Slow VOR: difficulty maintaining gaze with vertical head movements and gaze fixation.   VOR Cancellation: intact  Head-Impulse Test: NT - due to limited cervical ROM, pain and guarding.   Dynamic Visual Acuity: NT   POSITIONAL TESTING: Right Dix-Hallpike: no nystagmus Left Dix-Hallpike: no nystagmus Right Roll Test: apogeotropic nystagmus and symptoms worse on right side Left Roll Test: no nystagmus  MOTION SENSITIVITY:  Motion Sensitivity Quotient Intensity: 0 = none, 1 = Lightheaded, 2 = Mild, 3 = Moderate, 4 = Severe, 5 = Vomiting  Intensity  1. Sitting to supine 4  2. Supine to L side 1  3. Supine to R side 4  4. Supine to sitting 3  5. L Hallpike-Dix 0  6. Up from L  0  7. R Hallpike-Dix 0  8. Up from R  3  9. Sitting, head tipped to L knee   10. Head up from L knee   11. Sitting, head tipped to R knee   12. Head up from R knee   13. Sitting head turns x5   14.Sitting head nods x5   15. In stance, 180 turn to L    16. In stance, 180 turn to R     OTHOSTATICS: not done  FUNCTIONAL GAIT: 03/14/23-  DGI 18/24  [facility] PT Assessment - 03/14/23 0001       Balance   Balance Assessed Yes      Standardized Balance Assessment   Standardized Balance Assessment Dynamic Gait Index      Dynamic Gait Index   Level Surface Mild Impairment    Change in Gait Speed Moderate Impairment    Gait with Horizontal Head Turns Normal    Gait with Vertical Head Turns Normal    Gait and Pivot Turn Normal    Step Over Obstacle Mild Impairment    Step Around Obstacles Normal    Steps Moderate Impairment    Total Score 18               VESTIBULAR TREATMENT:  DATE:   03/14/2023  Therapeutic Activity:  assessment of balance and VOR and oculomotor exam.   DGI - 18/24 - noted impaired gait with decreased L knee extension and pain.  Progression of HEP with VOR exercises, education on safety.  Manual Therapy: to decrease muscle spasm and pain and improve mobility STM/TPR to cervical paraspinals, SCM, anterior scalenes in sitting.  Modalities: MHP x 5 min to neck in sitting during session while educating.    03/12/23 Neuromuscular Reeducation: .  Postural Reeducation: Seated Shoulder Rolls   10 reps Seated Cervical Retraction  10 reps Seated Scapular Retraction  10 reps Gentle Levator Scapulae Stretch  3 reps - 10-15 sec hold Retesting Canals: negative  Manual Therapy: to decrease muscle spasm and pain and improve mobility.  STM/TPR to cervical paraspinals, SCM, levators, PA mobs cervical spine.   03/08/23 Canalith Repositioning:   Epley Right: Number of Reps: 1 and Response to Treatment: symptoms resolved  02/27/23 Canalith Repositioning:  BBQ Roll Right: Number of Reps: 2 and Response to Treatment: symptoms improved Gaze Adaptation:na   Habituation:na  PATIENT EDUCATION: Education details: HEP update Person educated: Patient Education method: Explanation Education comprehension: verbalized understanding  HOME EXERCISE PROGRAM: Access Code: [email or web address] URL: [email or web address] Date: 03/14/2023 Prepared by: Harrie Foreman  Exercises - Seated Shoulder Rolls  - 3 x daily - 7 x weekly - 1 sets - 10 reps - Seated Cervical Retraction  - 3 x daily - 7 x weekly - 1 sets - 10 reps - Seated Scapular Retraction  - 3 x daily - 7 x weekly - 1 sets - 10 reps - Gentle Levator Scapulae Stretch  - 3 x daily - 7 x weekly - 1 sets - 3 reps - 10-15 sec hold - Seated Gaze Stabilization with Head Rotation  - 3 x daily - 7 x weekly - 3 sets - 10 reps - Seated Gaze Stabilization with Head Nod  - 3 x daily - 7 x weekly - 3 sets - 10 reps   GOALS: Goals  reviewed with patient? Yes  SHORT TERM GOALS: Target date: 03/13/23  I in home program to manage vertigo Baseline: Goal status: IN PROGRESS 03/14/23- compliant   LONG TERM GOALS: Target date: 8 weeks, 04/24/23  Improve DHI to 20 Baseline: 58 Goal status: IN PROGRESS  2.  Complete DGI, score greater than19/24 for indication of reduced fall risk Baseline: NA at eval Goal status: IN PROGRESS 03/14/23 - 18/24 very limited by R knee pain.   3.  Gait speed, increase to 1 m/sec or faster to align with value concordant with efficient speed for community ambulation and safety Baseline: .26m/sec Goal status: IN PROGRESS   ASSESSMENT:  CLINICAL IMPRESSION: Cleotilde Neer reported no vertigo symptoms today, but general feeling of dizziness/unsteadiness.  Assessed DGI, scored 18/24 which is predictive of falls in elderly.  However, she was very limited by L knee pain.  Also assessed VOR, limited again by neck pain and restricted cervical ROM and guarding, but noted saccades and difficulty with vertical eye movements, added VOR x 1 exercises to HEP.  Finished session with manual therapy to neck to decrease muscle spasm and pain, reported no dizziness after interventions.  Cleotilde Neer continues to demonstrate potential for improvement and would benefit from continued skilled therapy to address impairments.      OBJECTIVE IMPAIRMENTS: decreased activity tolerance, dizziness, hypomobility, impaired flexibility, and pain.   ACTIVITY LIMITATIONS: bending, sleeping, transfers, bed mobility, and reach over head  PARTICIPATION  LIMITATIONS: meal prep, cleaning, laundry, driving, community activity, and yard work  PERSONAL FACTORS: Behavior pattern, Past/current experiences, Time since onset of injury/illness/exacerbation, and 3+ comorbidities: Dm, HTN, anemia  are also affecting patient's functional outcome.   REHAB POTENTIAL: Good  CLINICAL DECISION MAKING: Evolving/moderate complexity  EVALUATION  COMPLEXITY: Low   PLAN:  PT FREQUENCY: 1-2x/week  PT DURATION: 8 weeks  PLANNED INTERVENTIONS: Therapeutic exercises, Therapeutic activity, Neuromuscular re-education, Balance training, Gait training, Patient/Family education, Self Care, Joint mobilization, Vestibular training, and Canalith repositioning  PLAN FOR NEXT SESSION: retest for BPPV if needed, continue to progress VOR exercises, manual therapy for cervicogenic component, balance training as tolerated.   Jena Gauss, PT, DPT  03/14/2023, 11:58 AM

## 2023-03-21 ENCOUNTER — Ambulatory Visit: Payer: Medicare Other | Admitting: Physical Therapy

## 2023-03-21 ENCOUNTER — Encounter: Payer: Self-pay | Admitting: Physical Therapy

## 2023-03-21 DIAGNOSIS — H8111 Benign paroxysmal vertigo, right ear: Secondary | ICD-10-CM | POA: Diagnosis not present

## 2023-03-21 DIAGNOSIS — R42 Dizziness and giddiness: Secondary | ICD-10-CM

## 2023-03-21 DIAGNOSIS — M542 Cervicalgia: Secondary | ICD-10-CM

## 2023-03-21 NOTE — Therapy (Signed)
OUTPATIENT PHYSICAL THERAPY VESTIBULAR TREATMENT     Patient Name: Kristen Murray MRN: [id number] DOB:13-Sep-1946, 77 y.o., female Today's Date: 03/21/2023  END OF SESSION:  PT End of Session - 03/21/23 1104     Visit Number 5    Date for PT Re-Evaluation 04/24/23    Progress Note Due on Visit 10    PT Start Time 1105    PT Stop Time 1146    PT Time Calculation (min) 41 min    Activity Tolerance Patient tolerated treatment well    Behavior During Therapy [facility] for tasks assessed/performed             Past Medical History:  Diagnosis Date   Arthritis    Borderline abnormal TFTs    as a teen   CAD (coronary artery disease)    Coronary CTA 3/22: Calcium score 1 (38th percentile), minimal nonobstructive CAD with 0-24% in mid RCA; aortic atherosclerosis   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   Nephrolithiasis    Dr Logan Bores, [facility], Hx of [3][   Polyp of colon    Radiculopathy    Past Surgical History:  Procedure Laterality Date   ABDOMINAL HYSTERECTOMY     TAH/BSO --fibroids, no cancer   BALLOON DILATION N/A 07/20/2014   Procedure: BALLOON DILATION;  Surgeon: Charolett Bumpers, MD;  Location: [facility];  Service: Endoscopy;  Laterality: N/A;   BREAST BIOPSY     Right   CARPAL TUNNEL RELEASE     & trigger thumb RUE; Dr Chaney Malling   COLONOSCOPY W/ POLYPECTOMY  07/2004   Neg 2011;  Dr Danise Edge   CYSTOSCOPY  11/2009   Neg   ESOPHAGOGASTRODUODENOSCOPY N/A 07/20/2014   Procedure: ESOPHAGOGASTRODUODENOSCOPY (EGD);  Surgeon: Charolett Bumpers, MD;  Location: [facility];  Service: Endoscopy;  Laterality: N/A;   FLEXIBLE BRONCHOSCOPY W/ UPPER ENDOSCOPY     neg   KNEE ARTHROSCOPY     Left   ROTATOR CUFF REPAIR     R shoulder   TONSILLECTOMY     TRIGGER FINGER RELEASE Right    Right Thumb   Patient Active Problem List   Diagnosis Date Noted   Mitral regurgitation 11/10/2022   Shortness of breath 10/04/2022   Allergic rhinitis due to pollen 05/29/2022   Cervical spondylosis without myelopathy 05/29/2022   Hyperglycemia due to type 2 diabetes mellitus (HCC) 05/29/2022   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   Perennial allergic rhinitis 05/29/2022   Pure hypercholesterolemia 05/29/2022   Simple chronic conjunctivitis 05/29/2022   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   Partial thickness rotator cuff tear 03/17/2022   Dysuria 02/28/2022   CAD (coronary artery disease)    Myelodysplasia, 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   Spinal stenosis of lumbar region 07/30/2020   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   Bilateral knee pain 05/11/2020  Atrial flutter (HCC) 01/29/2020   A-fib (HCC) 01/27/2020   Atrial flutter, paroxysmal (HCC) 01/26/2020   Lumbar spondylosis 10/27/2019   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   Iron deficiency anemia due to chronic blood loss 07/18/2019   Closed fracture of proximal phalanx of great toe 09/02/2018   Nuclear sclerosis of both eyes 08/20/2017   High risk medication use 06/15/2017   Recurrent urinary tract infection 10/14/2015   Pain in the chest    Chest pain at rest 11/26/2014   Shingles 05/14/2014   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   Vaginal atrophy 10/16/2012   Menopause 10/16/2012   Chest pain, atypical 02/19/2012   Type 2 diabetes mellitus with neurological manifestations, 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/19/2012   Migraine 01/09/2012    Interstitial cystitis (chronic) without hematuria 12/20/2011   Cervical stenosis of spinal canal 08/28/2011   Meningioma (HCC) 08/28/2011   COSTOCHONDRITIS 11/16/2010   DISTURBANCES OF SENSATION OF SMELL AND TASTE 04/05/2010   NEPHROLITHIASIS, HX OF 02/08/2010   EDEMA 10/01/2009   Brachial neuritis or radiculitis NOS 06/14/2009   EXTERNAL HEMORRHOIDS 05/25/2009   MUSCLE PAIN 05/17/2009   Hyperlipidemia 04/23/2009   ABNORMAL THYROID FUNCTION TESTS 04/23/2009   MENINGIOMA 12/26/2007   HEADACHE 12/26/2007   RADICULOPATHY 04/02/2007   Essential hypertension 11/21/2006   GERD 11/21/2006    PCP: Eliane Decree, PA REFERRING PROVIDER: Eliane Decree, PA  REFERRING DIAG: vertigo  THERAPY DIAG:  BPPV (benign paroxysmal positional vertigo), right  Cervicalgia  Dizziness and giddiness  ONSET DATE: 4 weeks   Rationale for Evaluation and Treatment: Rehabilitation  SUBJECTIVE:   SUBJECTIVE STATEMENT: Pt. Reports she has been having more dizziness last couple of days again rolling over in bed.  Neck pain has also been bad.  Told by pain management in past he could give her injections for neck but would have to go off eliquis.  Pt accompanied by: self  PERTINENT HISTORY: woke up 4 weeks ago with vertigo Sx  PMH: migraines, arthritis, neck pain, L knee OA, anemia.   PAIN:  Are you having pain? Yes: NPRS scale: 10/10 Pain location: upper traps, post c spine Pain description: continuous, chronic Aggravating factors: excessive movement particularly with looking down Relieving factors: heat, ice, meds  PRECAUTIONS: None  WEIGHT BEARING RESTRICTIONS: No  FALLS: Has patient fallen in last 6 months? No  LIVING ENVIRONMENT: Lives with: lives with their spouse Lives in: House/apartment Stairs: Yes: Internal: 11 steps; on right going up and External: 4 steps; on left going up Has following equipment at home: Single point cane  PLOF: Independent  PATIENT GOALS: get rid of  dizziness, be able to put my eye drops in without the dizziness  OBJECTIVE:   DIAGNOSTIC FINDINGS: na  COGNITION: Overall cognitive status: Within functional limits for tasks assessed   SENSATION: WFL    MUSCLE TONE: wnl   POSTURE:  increased thoracic kyphosis  Cervical ROM:    Active A/PROM (deg) eval  Flexion 25  Extension 20  Right lateral flexion   Left lateral flexion   Right rotation 40  Left rotation 40  (Blank rows = not tested)  STRENGTH: wfl Ue's   LOWER EXTREMITY MMT: WFL LE's  Sit to supine Min A  TRANSFERS: Assistive device utilized: None  Sit to stand: Complete Independence Stand to sit: Complete Independence Chair to chair: Complete Independence Floor:  na   GAIT: Gait pattern: step through pattern, decreased arm swing- Right, decreased arm swing- Left, decreased step length- Right, decreased step length- Left, decreased stride  length, Right foot flat, and Left foot flat Distance walked: in clinic, over  64' at a time Assistive device utilized: None Level of assistance: Complete Independence Comments: slowed gait speed  FUNCTIONAL TESTS:  Not performed today  PATIENT SURVEYS:  DHI 58 ,   VESTIBULAR ASSESSMENT:  GENERAL OBSERVATION: healthy and pleasant female, neck guarded with functional movements, stiff   SYMPTOM BEHAVIOR:  Subjective history: 4 week h/o o sudden onset vertigo, nausea, no head trauma, had allergies.  Non-Vestibular symptoms: neck pain, headaches, and nausea/vomiting  Type of dizziness: Imbalance (Disequilibrium) and Unsteady with head/body turns  Frequency: daily  Duration: continuous  Aggravating factors: Induced by position change: lying supine, rolling to the right, and sit to stand and Induced by motion: looking up at the ceiling, turning body quickly, and turning head quickly  Relieving factors: head stationary and closing eyes  Progression of symptoms: better  OCULOMOTOR EXAM:  Ocular Alignment:  normal  Ocular ROM: No Limitations  Spontaneous Nystagmus: absent  Gaze-Induced Nystagmus: absent  Smooth Pursuits: saccades  Saccades: hypermetric/overshoots  Convergence/Divergence: 4 cm   VESTIBULAR - OCULAR REFLEX:   Slow VOR: difficulty maintaining gaze with vertical head movements and gaze fixation.   VOR Cancellation: intact  Head-Impulse Test: NT - due to limited cervical ROM, pain and guarding.   Dynamic Visual Acuity: NT   POSITIONAL TESTING: Right Dix-Hallpike: no nystagmus Left Dix-Hallpike: no nystagmus Right Roll Test: apogeotropic nystagmus and symptoms worse on right side Left Roll Test: no nystagmus  MOTION SENSITIVITY:  Motion Sensitivity Quotient Intensity: 0 = none, 1 = Lightheaded, 2 = Mild, 3 = Moderate, 4 = Severe, 5 = Vomiting  Intensity  1. Sitting to supine 4  2. Supine to L side 1  3. Supine to R side 4  4. Supine to sitting 3  5. L Hallpike-Dix 0  6. Up from L  0  7. R Hallpike-Dix 0  8. Up from R  3  9. Sitting, head tipped to L knee   10. Head up from L knee   11. Sitting, head tipped to R knee   12. Head up from R knee   13. Sitting head turns x5   14.Sitting head nods x5   15. In stance, 180 turn to L    16. In stance, 180 turn to R     OTHOSTATICS: not done  FUNCTIONAL GAIT: 03/14/23-  DGI 18/24      VESTIBULAR TREATMENT:                                                                                                   DATE:  03/21/23 Canalith Repositioning: Epley Right: Number of Reps: 1 and Response to Treatment: symptoms resolved Self Care: Reviewed mechanics of BPPV and sleep positioning - she sleeps on her R side which is mostly likely why it keeps reoccuring on the R.  She will try to sleep in her recliner for a few days.  Also discussed traveling on plane and importance of hydration.  Manual Therapy: to decrease muscle spasm and pain and improve mobility STM/TPR to  R UT/LS and cervical parspinals, skilled palpation and  monitoring during dry needling. Trigger Point Dry-Needling  Treatment instructions: Expect mild to moderate muscle soreness. S/S of pneumothorax if dry needled over a lung field, and to seek immediate medical attention should they occur. Patient verbalized understanding of these instructions and education. Patient Consent Given: Yes Education handout provided: Previously provided Muscles treated: R UT Electrical stimulation performed: No Parameters: N/A Treatment response/outcome: Twitch Response Elicited and Palpable Increase in Muscle Length   03/14/2023 Therapeutic Activity:  assessment of balance and VOR and oculomotor exam.   DGI - 18/24 - noted impaired gait with decreased L knee extension and pain.  Progression of HEP with VOR exercises, education on safety.  Manual Therapy: to decrease muscle spasm and pain and improve mobility STM/TPR to cervical paraspinals, SCM, anterior scalenes in sitting.  Modalities: MHP x 5 min to neck in sitting during session while educating.    03/12/23 Neuromuscular Reeducation: .  Postural Reeducation: Seated Shoulder Rolls   10 reps Seated Cervical Retraction  10 reps Seated Scapular Retraction  10 reps Gentle Levator Scapulae Stretch  3 reps - 10-15 sec hold Retesting Canals: negative  Manual Therapy: to decrease muscle spasm and pain and improve mobility.  STM/TPR to cervical paraspinals, SCM, levators, PA mobs cervical spine.   03/08/23 Canalith Repositioning:   Epley Right: Number of Reps: 1 and Response to Treatment: symptoms resolved  02/27/23 Canalith Repositioning:  BBQ Roll Right: Number of Reps: 2 and Response to Treatment: symptoms improved Gaze Adaptation:na   Habituation:na  PATIENT EDUCATION: Education details: see self care Person educated: Patient Education method: Explanation Education comprehension: verbalized understanding  HOME EXERCISE PROGRAM: Access Code: [email or web address] URL: [email or web address] Date:  03/14/2023 Prepared by: Harrie Foreman  Exercises - Seated Shoulder Rolls  - 3 x daily - 7 x weekly - 1 sets - 10 reps - Seated Cervical Retraction  - 3 x daily - 7 x weekly - 1 sets - 10 reps - Seated Scapular Retraction  - 3 x daily - 7 x weekly - 1 sets - 10 reps - Gentle Levator Scapulae Stretch  - 3 x daily - 7 x weekly - 1 sets - 3 reps - 10-15 sec hold - Seated Gaze Stabilization with Head Rotation  - 3 x daily - 7 x weekly - 3 sets - 10 reps - Seated Gaze Stabilization with Head Nod  - 3 x daily - 7 x weekly - 3 sets - 10 reps   GOALS: Goals reviewed with patient? Yes  SHORT TERM GOALS: Target date: 03/13/23  I in home program to manage vertigo Baseline: Goal status: IN PROGRESS 03/14/23- compliant   LONG TERM GOALS: Target date: 8 weeks, 04/24/23  Improve DHI to 20 Baseline: 58 Goal status: IN PROGRESS  2.  Complete DGI, score greater than19/24 for indication of reduced fall risk Baseline: NA at eval Goal status: IN PROGRESS 03/14/23 - 18/24 very limited by R knee pain.   3.  Gait speed, increase to 1 m/sec or faster to align with value concordant with efficient speed for community ambulation and safety Baseline: .49m/sec Goal status: IN PROGRESS   ASSESSMENT:  CLINICAL IMPRESSION: Cleotilde Neer returned today reporting vertigo symptoms, was postive for R posterior canalithiasis again with upward rotational nystagmus x 10 seconds, performed Epley x 1 with complete resolution of symptoms.  Other canals checked and were negative.  Discussed mechanism of BPPV and gravity, since she primarily sleeps with R ear down.  She  continues to have severe R sided neck pain.  After explanation of DN rational, procedures, outcomes and potential side effects, patient verbalized consent to DN treatment in conjunction with manual STM/DTM and TPR to reduce ttp/muscle tension in UT, she tolerated well with palpable reduction in pain and muscle tension, with patient noting less pain upon  initiation of movement following DN. Pt educated to expect mild to moderate muscle soreness for up to 24-48 hrs and instructed to continue prescribed home exercise program and current activity level with pt verbalizing understanding of theses instructions.   Cleotilde Neer continues to demonstrate potential for improvement and would benefit from continued skilled therapy to address impairments.      OBJECTIVE IMPAIRMENTS: decreased activity tolerance, dizziness, hypomobility, impaired flexibility, and pain.   ACTIVITY LIMITATIONS: bending, sleeping, transfers, bed mobility, and reach over head  PARTICIPATION LIMITATIONS: meal prep, cleaning, laundry, driving, community activity, and yard work  PERSONAL FACTORS: Behavior pattern, Past/current experiences, Time since onset of injury/illness/exacerbation, and 3+ comorbidities: Dm, HTN, anemia  are also affecting patient's functional outcome.   REHAB POTENTIAL: Good  CLINICAL DECISION MAKING: Evolving/moderate complexity  EVALUATION COMPLEXITY: Low   PLAN:  PT FREQUENCY: 1-2x/week  PT DURATION: 8 weeks  PLANNED INTERVENTIONS: Therapeutic exercises, Therapeutic activity, Neuromuscular re-education, Balance training, Gait training, Patient/Family education, Self Care, Joint mobilization, Vestibular training, and Canalith repositioning  PLAN FOR NEXT SESSION: retest for BPPV, continue to progress VOR exercises, manual therapy for cervicogenic component, balance training as tolerated.   Jena Gauss, PT, DPT  03/21/2023, 11:57 AM

## 2023-03-23 ENCOUNTER — Encounter: Payer: Self-pay | Admitting: Physical Therapy

## 2023-03-23 ENCOUNTER — Ambulatory Visit: Payer: Medicare Other | Admitting: Physical Therapy

## 2023-03-23 DIAGNOSIS — M542 Cervicalgia: Secondary | ICD-10-CM

## 2023-03-23 DIAGNOSIS — H8111 Benign paroxysmal vertigo, right ear: Secondary | ICD-10-CM | POA: Diagnosis not present

## 2023-03-23 DIAGNOSIS — R42 Dizziness and giddiness: Secondary | ICD-10-CM

## 2023-03-23 NOTE — Therapy (Signed)
OUTPATIENT PHYSICAL THERAPY VESTIBULAR TREATMENT     Patient Name: Kristen Murray MRN: [id number] DOB:[birth year], 77 y.o., female Today's Date: 03/23/2023  END OF SESSION:  PT End of Session - 03/23/23 1025     Visit Number 6    Date for PT Re-Evaluation 04/24/23    Progress Note Due on Visit 10    PT Start Time 1017    PT Stop Time 1059    PT Time Calculation (min) 42 min    Activity Tolerance Patient tolerated treatment well    Behavior During Therapy WFL for tasks assessed/performed             Past Medical History:  Diagnosis Date   Arthritis    Borderline abnormal TFTs    as a teen   CAD (coronary artery disease)    Coronary CTA 3/22: Calcium score 1 (38th percentile), minimal nonobstructive CAD with 0-24% in mid RCA; aortic atherosclerosis   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   Nephrolithiasis    Dr Logan Bores, [facility], Hx of [3][   Polyp of colon    Radiculopathy    Past Surgical History:  Procedure Laterality Date   ABDOMINAL HYSTERECTOMY     TAH/BSO --fibroids, no cancer   BALLOON DILATION N/A 07/20/2014   Procedure: BALLOON DILATION;  Surgeon: Charolett Bumpers, MD;  Location: [facility];  Service: Endoscopy;  Laterality: N/A;   BREAST BIOPSY     Right   CARPAL TUNNEL RELEASE     & trigger thumb RUE; Dr Chaney Malling   COLONOSCOPY W/ POLYPECTOMY  07/2004   Neg 2011;  Dr Danise Edge   CYSTOSCOPY  11/2009   Neg   ESOPHAGOGASTRODUODENOSCOPY N/A 07/20/2014   Procedure: ESOPHAGOGASTRODUODENOSCOPY (EGD);  Surgeon: Charolett Bumpers, MD;  Location: [facility];  Service: Endoscopy;  Laterality: N/A;   FLEXIBLE BRONCHOSCOPY W/ UPPER ENDOSCOPY     neg   KNEE ARTHROSCOPY     Left   ROTATOR CUFF REPAIR     R shoulder   TONSILLECTOMY     TRIGGER FINGER RELEASE Right    Right Thumb   Patient Active Problem List   Diagnosis Date Noted   Mitral regurgitation 11/10/2022   Shortness of breath 10/04/2022   Allergic rhinitis due to pollen 05/29/2022   Cervical spondylosis without myelopathy 05/29/2022   Hyperglycemia due to type 2 diabetes mellitus (HCC) 05/29/2022   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   Perennial allergic rhinitis 05/29/2022   Pure hypercholesterolemia 05/29/2022   Simple chronic conjunctivitis 05/29/2022   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   Partial thickness rotator cuff tear 03/17/2022   Dysuria 02/28/2022   CAD (coronary artery disease)    Myelodysplasia, 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   Spinal stenosis of lumbar region 07/30/2020   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   Bilateral knee pain 05/11/2020  Atrial flutter (HCC) 01/29/2020   A-fib (HCC) 01/27/2020   Atrial flutter, paroxysmal (HCC) 01/26/2020   Lumbar spondylosis 10/27/2019   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   Iron deficiency anemia due to chronic blood loss 07/18/2019   Closed fracture of proximal phalanx of great toe 09/02/2018   Nuclear sclerosis of both eyes 08/20/2017   High risk medication use 06/15/2017   Recurrent urinary tract infection 10/14/2015   Pain in the chest    Chest pain at rest 11/26/2014   Shingles 05/14/2014   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   Vaginal atrophy 10/16/2012   Menopause 10/16/2012   Chest pain, atypical 02/19/2012   Type 2 diabetes mellitus with neurological manifestations, 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/19/2012   Migraine 01/09/2012    Interstitial cystitis (chronic) without hematuria 12/20/2011   Cervical stenosis of spinal canal 08/28/2011   Meningioma (HCC) 08/28/2011   COSTOCHONDRITIS 11/16/2010   DISTURBANCES OF SENSATION OF SMELL AND TASTE 04/05/2010   NEPHROLITHIASIS, HX OF 02/08/2010   EDEMA 10/01/2009   Brachial neuritis or radiculitis NOS 06/14/2009   EXTERNAL HEMORRHOIDS 05/25/2009   MUSCLE PAIN 05/17/2009   Hyperlipidemia 04/23/2009   ABNORMAL THYROID FUNCTION TESTS 04/23/2009   MENINGIOMA 12/26/2007   HEADACHE 12/26/2007   RADICULOPATHY 04/02/2007   Essential hypertension 11/21/2006   GERD 11/21/2006    PCP: Eliane Decree, PA REFERRING PROVIDER: Eliane Decree, PA  REFERRING DIAG: vertigo  THERAPY DIAG:  BPPV (benign paroxysmal positional vertigo), right  Cervicalgia  Dizziness and giddiness  ONSET DATE: 4 weeks   Rationale for Evaluation and Treatment: Rehabilitation  SUBJECTIVE:   SUBJECTIVE STATEMENT: No dizziness today, but still feels off balance.  Noticed her neck turning much better after TrDN and less pain, although sore the next day.  Pt accompanied by: self  PERTINENT HISTORY: woke up 4 weeks ago with vertigo Sx  PMH: migraines, arthritis, neck pain, L knee OA, anemia.   PAIN:  Are you having pain? Yes: NPRS scale: 5/10 Pain location: upper traps, post c spine Pain description: continuous, chronic Aggravating factors: excessive movement particularly with looking down Relieving factors: heat, ice, meds  PRECAUTIONS: None  WEIGHT BEARING RESTRICTIONS: No  FALLS: Has patient fallen in last 6 months? No  LIVING ENVIRONMENT: Lives with: lives with their spouse Lives in: House/apartment Stairs: Yes: Internal: 11 steps; on right going up and External: 4 steps; on left going up Has following equipment at home: Single point cane  PLOF: Independent  PATIENT GOALS: get rid of dizziness, be able to put my eye drops in without the dizziness  OBJECTIVE:   DIAGNOSTIC  FINDINGS: na  COGNITION: Overall cognitive status: Within functional limits for tasks assessed   SENSATION: WFL    MUSCLE TONE: wnl   POSTURE:  increased thoracic kyphosis  Cervical ROM:    Active A/PROM (deg) eval  Flexion 25  Extension 20  Right lateral flexion   Left lateral flexion   Right rotation 40  Left rotation 40  (Blank rows = not tested)  STRENGTH: wfl Ue's   LOWER EXTREMITY MMT: WFL LE's  Sit to supine Min A  TRANSFERS: Assistive device utilized: None  Sit to stand: Complete Independence Stand to sit: Complete Independence Chair to chair: Complete Independence Floor:  na   GAIT: Gait pattern: step through pattern, decreased arm swing- Right, decreased arm swing- Left, decreased step length- Right, decreased step length- Left, decreased stride length, Right foot flat, and Left foot flat Distance walked: in clinic, over  39' at a time Assistive device  utilized: None Level of assistance: Complete Independence Comments: slowed gait speed  FUNCTIONAL TESTS:  Not performed today  PATIENT SURVEYS:  DHI 58 ,   VESTIBULAR ASSESSMENT:  GENERAL OBSERVATION: healthy and pleasant female, neck guarded with functional movements, stiff   SYMPTOM BEHAVIOR:  Subjective history: 4 week h/o o sudden onset vertigo, nausea, no head trauma, had allergies.  Non-Vestibular symptoms: neck pain, headaches, and nausea/vomiting  Type of dizziness: Imbalance (Disequilibrium) and Unsteady with head/body turns  Frequency: daily  Duration: continuous  Aggravating factors: Induced by position change: lying supine, rolling to the right, and sit to stand and Induced by motion: looking up at the ceiling, turning body quickly, and turning head quickly  Relieving factors: head stationary and closing eyes  Progression of symptoms: better  OCULOMOTOR EXAM:  Ocular Alignment: normal  Ocular ROM: No Limitations  Spontaneous Nystagmus: absent  Gaze-Induced Nystagmus:  absent  Smooth Pursuits: saccades  Saccades: hypermetric/overshoots  Convergence/Divergence: 4 cm   VESTIBULAR - OCULAR REFLEX:   Slow VOR: difficulty maintaining gaze with vertical head movements and gaze fixation.   VOR Cancellation: intact  Head-Impulse Test: NT - due to limited cervical ROM, pain and guarding.   Dynamic Visual Acuity: NT   POSITIONAL TESTING: Right Dix-Hallpike: no nystagmus Left Dix-Hallpike: no nystagmus Right Roll Test: apogeotropic nystagmus and symptoms worse on right side Left Roll Test: no nystagmus  MOTION SENSITIVITY:  Motion Sensitivity Quotient Intensity: 0 = none, 1 = Lightheaded, 2 = Mild, 3 = Moderate, 4 = Severe, 5 = Vomiting  Intensity  1. Sitting to supine 4  2. Supine to L side 1  3. Supine to R side 4  4. Supine to sitting 3  5. L Hallpike-Dix 0  6. Up from L  0  7. R Hallpike-Dix 0  8. Up from R  3  9. Sitting, head tipped to L knee   10. Head up from L knee   11. Sitting, head tipped to R knee   12. Head up from R knee   13. Sitting head turns x5   14.Sitting head nods x5   15. In stance, 180 turn to L    16. In stance, 180 turn to R     OTHOSTATICS: not done  FUNCTIONAL GAIT: 03/14/23-  DGI 18/24      VESTIBULAR TREATMENT:                                                                                                   DATE:   03/23/23 Therapeutic Exercise: to improve strength and mobility.  Demo, verbal and tactile cues throughout for technique. Review of HEP: chin tucks, scap squeezes, shoulder rolls, levator stretch.  Therapeutic Activity:  recheck of R horizontal canal with dix hallpike.  Negative.  Manual Therapy: to decrease muscle spasm and pain and improve mobility STM/TPR to bil UT/LS and cervical parspinals, skilled palpation and monitoring during dry needling. Trigger Point Dry-Needling  Treatment instructions: Expect mild to moderate muscle soreness. S/S of pneumothorax if dry needled over a lung field, and  to seek immediate medical attention should  they occur. Patient verbalized understanding of these instructions and education. Patient Consent Given: Yes Education handout provided: Previously provided Muscles treated: bil UT/LS, bil C4 multifidi Electrical stimulation performed: No Parameters: N/A Treatment response/outcome: Twitch Response Elicited and Palpable Increase in Muscle Length   03/21/23 Canalith Repositioning: Epley Right: Number of Reps: 1 and Response to Treatment: symptoms resolved Self Care: Reviewed mechanics of BPPV and sleep positioning - she sleeps on her R side which is mostly likely why it keeps reoccuring on the R.  She will try to sleep in her recliner for a few days.  Also discussed traveling on plane and importance of hydration.  Manual Therapy: to decrease muscle spasm and pain and improve mobility STM/TPR to R UT/LS and cervical parspinals, skilled palpation and monitoring during dry needling. Trigger Point Dry-Needling  Treatment instructions: Expect mild to moderate muscle soreness. S/S of pneumothorax if dry needled over a lung field, and to seek immediate medical attention should they occur. Patient verbalized understanding of these instructions and education. Patient Consent Given: Yes Education handout provided: Previously provided Muscles treated: R UT Electrical stimulation performed: No Parameters: N/A Treatment response/outcome: Twitch Response Elicited and Palpable Increase in Muscle Length   03/14/2023 Therapeutic Activity:  assessment of balance and VOR and oculomotor exam.   DGI - 18/24 - noted impaired gait with decreased L knee extension and pain.  Progression of HEP with VOR exercises, education on safety.  Manual Therapy: to decrease muscle spasm and pain and improve mobility STM/TPR to cervical paraspinals, SCM, anterior scalenes in sitting.  Modalities: MHP x 5 min to neck in sitting during session while educating.    03/12/23 Neuromuscular  Reeducation: .  Postural Reeducation: Seated Shoulder Rolls   10 reps Seated Cervical Retraction  10 reps Seated Scapular Retraction  10 reps Gentle Levator Scapulae Stretch  3 reps - 10-15 sec hold Retesting Canals: negative  Manual Therapy: to decrease muscle spasm and pain and improve mobility.  STM/TPR to cervical paraspinals, SCM, levators, PA mobs cervical spine.    PATIENT EDUCATION: Education details: HEP review Person educated: Patient Education method: Explanation, Demonstration, and Handouts Education comprehension: verbalized understanding and returned demonstration  HOME EXERCISE PROGRAM: Access Code: [email or web address] URL: [email or web address] Date: 03/14/2023 Prepared by: Harrie Foreman  Exercises - Seated Shoulder Rolls  - 3 x daily - 7 x weekly - 1 sets - 10 reps - Seated Cervical Retraction  - 3 x daily - 7 x weekly - 1 sets - 10 reps - Seated Scapular Retraction  - 3 x daily - 7 x weekly - 1 sets - 10 reps - Gentle Levator Scapulae Stretch  - 3 x daily - 7 x weekly - 1 sets - 3 reps - 10-15 sec hold - Seated Gaze Stabilization with Head Rotation  - 3 x daily - 7 x weekly - 3 sets - 10 reps - Seated Gaze Stabilization with Head Nod  - 3 x daily - 7 x weekly - 3 sets - 10 reps   GOALS: Goals reviewed with patient? Yes  SHORT TERM GOALS: Target date: 03/13/23  I in home program to manage vertigo Baseline: Goal status: IN PROGRESS 03/14/23- compliant   LONG TERM GOALS: Target date: 8 weeks, 04/24/23  Improve DHI to 20 Baseline: 58 Goal status: IN PROGRESS  2.  Complete DGI, score greater than19/24 for indication of reduced fall risk Baseline: NA at eval Goal status: IN PROGRESS 03/14/23 - 18/24 very limited by R knee pain.   3.  Gait speed, increase to 1 m/sec or faster to align with value concordant with efficient speed for community ambulation and safety Baseline: .48m/sec Goal status: IN PROGRESS   ASSESSMENT:  CLINICAL  IMPRESSION: Cleotilde Neer reported no dizziness today and was negative for BPPV.  Good response to TrDN last session, so performed to more muscle groups today, tolerated fairly.  Reviewed HEP and printed another copy as having trouble remembering.  Cleotilde Neer continues to demonstrate potential for improvement and would benefit from continued skilled therapy to address impairments.      OBJECTIVE IMPAIRMENTS: decreased activity tolerance, dizziness, hypomobility, impaired flexibility, and pain.   ACTIVITY LIMITATIONS: bending, sleeping, transfers, bed mobility, and reach over head  PARTICIPATION LIMITATIONS: meal prep, cleaning, laundry, driving, community activity, and yard work  PERSONAL FACTORS: Behavior pattern, Past/current experiences, Time since onset of injury/illness/exacerbation, and 3+ comorbidities: Dm, HTN, anemia  are also affecting patient's functional outcome.   REHAB POTENTIAL: Good  CLINICAL DECISION MAKING: Evolving/moderate complexity  EVALUATION COMPLEXITY: Low   PLAN:  PT FREQUENCY: 1-2x/week  PT DURATION: 8 weeks  PLANNED INTERVENTIONS: Therapeutic exercises, Therapeutic activity, Neuromuscular re-education, Balance training, Gait training, Patient/Family education, Self Care, Joint mobilization, Vestibular training, and Canalith repositioning  PLAN FOR NEXT SESSION: retest for BPPV, continue to progress VOR exercises, manual therapy for cervicogenic component, balance training as tolerated.   Jena Gauss, PT, DPT  03/23/2023, 11:16 AM

## 2023-03-29 ENCOUNTER — Encounter: Payer: Self-pay | Admitting: Physical Therapy

## 2023-03-29 ENCOUNTER — Ambulatory Visit: Payer: Medicare Other | Admitting: Physical Therapy

## 2023-03-29 DIAGNOSIS — R42 Dizziness and giddiness: Secondary | ICD-10-CM

## 2023-03-29 DIAGNOSIS — H8111 Benign paroxysmal vertigo, right ear: Secondary | ICD-10-CM

## 2023-03-29 DIAGNOSIS — M542 Cervicalgia: Secondary | ICD-10-CM

## 2023-03-29 NOTE — Therapy (Signed)
OUTPATIENT PHYSICAL THERAPY VESTIBULAR TREATMENT     Patient Name: Kristen Murray MRN: [id number] DOB:08/12/1946, 77 y.o., female Today's Date: 03/29/2023  END OF SESSION:  PT End of Session - 03/29/23 0804     Visit Number 7    Date for PT Re-Evaluation 04/24/23    Progress Note Due on Visit 10    PT Start Time 0804    PT Stop Time 0844    PT Time Calculation (min) 40 min    Activity Tolerance Patient tolerated treatment well    Behavior During Therapy [facility] for tasks assessed/performed             Past Medical History:  Diagnosis Date   Arthritis    Borderline abnormal TFTs    as a teen   CAD (coronary artery disease)    Coronary CTA 3/22: Calcium score 1 (38th percentile), minimal nonobstructive CAD with 0-24% in mid RCA; aortic atherosclerosis   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   Nephrolithiasis    Dr Logan Bores, [facility], Hx of [3][   Polyp of colon    Radiculopathy    Past Surgical History:  Procedure Laterality Date   ABDOMINAL HYSTERECTOMY     TAH/BSO --fibroids, no cancer   BALLOON DILATION N/A 07/20/2014   Procedure: BALLOON DILATION;  Surgeon: Charolett Bumpers, MD;  Location: [facility];  Service: Endoscopy;  Laterality: N/A;   BREAST BIOPSY     Right   CARPAL TUNNEL RELEASE     & trigger thumb RUE; Dr Chaney Malling   COLONOSCOPY W/ POLYPECTOMY  07/2004   Neg 2011;  Dr Danise Edge   CYSTOSCOPY  11/2009   Neg   ESOPHAGOGASTRODUODENOSCOPY N/A 07/20/2014   Procedure: ESOPHAGOGASTRODUODENOSCOPY (EGD);  Surgeon: Charolett Bumpers, MD;  Location: [facility];  Service: Endoscopy;  Laterality: N/A;   FLEXIBLE BRONCHOSCOPY W/ UPPER ENDOSCOPY     neg   KNEE ARTHROSCOPY     Left   ROTATOR CUFF REPAIR     R shoulder   TONSILLECTOMY     TRIGGER FINGER RELEASE Right    Right Thumb   Patient Active Problem List   Diagnosis Date Noted   Mitral regurgitation 11/10/2022   Shortness of breath 10/04/2022   Allergic rhinitis due to pollen 05/29/2022   Cervical spondylosis without myelopathy 05/29/2022   Hyperglycemia due to type 2 diabetes mellitus (HCC) 05/29/2022   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   Perennial allergic rhinitis 05/29/2022   Pure hypercholesterolemia 05/29/2022   Simple chronic conjunctivitis 05/29/2022   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   Partial thickness rotator cuff tear 03/17/2022   Dysuria 02/28/2022   CAD (coronary artery disease)    Myelodysplasia, 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   Spinal stenosis of lumbar region 07/30/2020   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   Bilateral knee pain 05/11/2020  Atrial flutter (HCC) 01/29/2020   A-fib (HCC) 01/27/2020   Atrial flutter, paroxysmal (HCC) 01/26/2020   Lumbar spondylosis 10/27/2019   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   Iron deficiency anemia due to chronic blood loss 07/18/2019   Closed fracture of proximal phalanx of great toe 09/02/2018   Nuclear sclerosis of both eyes 08/20/2017   High risk medication use 06/15/2017   Recurrent urinary tract infection 10/14/2015   Pain in the chest    Chest pain at rest 11/26/2014   Shingles 05/14/2014   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   Vaginal atrophy 10/16/2012   Menopause 10/16/2012   Chest pain, atypical 02/19/2012   Type 2 diabetes mellitus with neurological manifestations, 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/19/2012   Migraine 01/09/2012    Interstitial cystitis (chronic) without hematuria 12/20/2011   Cervical stenosis of spinal canal 08/28/2011   Meningioma (HCC) 08/28/2011   COSTOCHONDRITIS 11/16/2010   DISTURBANCES OF SENSATION OF SMELL AND TASTE 04/05/2010   NEPHROLITHIASIS, HX OF 02/08/2010   EDEMA 10/01/2009   Brachial neuritis or radiculitis NOS 06/14/2009   EXTERNAL HEMORRHOIDS 05/25/2009   MUSCLE PAIN 05/17/2009   Hyperlipidemia 04/23/2009   ABNORMAL THYROID FUNCTION TESTS 04/23/2009   MENINGIOMA 12/26/2007   HEADACHE 12/26/2007   RADICULOPATHY 04/02/2007   Essential hypertension 11/21/2006   GERD 11/21/2006    PCP: Eliane Decree, PA REFERRING PROVIDER: Eliane Decree, PA  REFERRING DIAG: vertigo  THERAPY DIAG:  BPPV (benign paroxysmal positional vertigo), right  Cervicalgia  Dizziness and giddiness  ONSET DATE: 4 weeks   Rationale for Evaluation and Treatment: Rehabilitation  SUBJECTIVE:   SUBJECTIVE STATEMENT: Felt like dizziness trying to come on when laid down last night, but fine this morning.  Neck still hurting on R side.   Doing stretches but hurts when stretches the R.  Pt accompanied by: self  PERTINENT HISTORY: woke up 4 weeks ago with vertigo Sx  PMH: migraines, arthritis, neck pain, L knee OA, anemia.   PAIN:  Are you having pain? Yes: NPRS scale: 6/10 Pain location: upper traps, post c spine Pain description: continuous, chronic Aggravating factors: excessive movement particularly with looking down Relieving factors: heat, ice, meds  PRECAUTIONS: None  WEIGHT BEARING RESTRICTIONS: No  FALLS: Has patient fallen in last 6 months? No  LIVING ENVIRONMENT: Lives with: lives with their spouse Lives in: House/apartment Stairs: Yes: Internal: 11 steps; on right going up and External: 4 steps; on left going up Has following equipment at home: Single point cane  PLOF: Independent  PATIENT GOALS: get rid of dizziness, be able to put my eye drops in without the  dizziness  OBJECTIVE:   DIAGNOSTIC FINDINGS: na  COGNITION: Overall cognitive status: Within functional limits for tasks assessed   SENSATION: WFL    MUSCLE TONE: wnl   POSTURE:  increased thoracic kyphosis  Cervical ROM:    Active A/PROM (deg) eval  Flexion 25  Extension 20  Right lateral flexion   Left lateral flexion   Right rotation 40  Left rotation 40  (Blank rows = not tested)  STRENGTH: wfl Ue's   LOWER EXTREMITY MMT: WFL LE's  Sit to supine Min A  TRANSFERS: Assistive device utilized: None  Sit to stand: Complete Independence Stand to sit: Complete Independence Chair to chair: Complete Independence Floor:  na   GAIT: Gait pattern: step through pattern, decreased arm swing- Right, decreased arm swing- Left, decreased step length- Right, decreased step length- Left, decreased stride length, Right foot flat, and Left foot flat Distance walked: in clinic,  over  61' at a time Assistive device utilized: None Level of assistance: Complete Independence Comments: slowed gait speed  FUNCTIONAL TESTS:  Not performed today  PATIENT SURVEYS:  DHI 58 ,   VESTIBULAR ASSESSMENT:  GENERAL OBSERVATION: healthy and pleasant female, neck guarded with functional movements, stiff   SYMPTOM BEHAVIOR:  Subjective history: 4 week h/o o sudden onset vertigo, nausea, no head trauma, had allergies.  Non-Vestibular symptoms: neck pain, headaches, and nausea/vomiting  Type of dizziness: Imbalance (Disequilibrium) and Unsteady with head/body turns  Frequency: daily  Duration: continuous  Aggravating factors: Induced by position change: lying supine, rolling to the right, and sit to stand and Induced by motion: looking up at the ceiling, turning body quickly, and turning head quickly  Relieving factors: head stationary and closing eyes  Progression of symptoms: better  OCULOMOTOR EXAM:  Ocular Alignment: normal  Ocular ROM: No Limitations  Spontaneous Nystagmus:  absent  Gaze-Induced Nystagmus: absent  Smooth Pursuits: saccades  Saccades: hypermetric/overshoots  Convergence/Divergence: 4 cm   VESTIBULAR - OCULAR REFLEX:   Slow VOR: difficulty maintaining gaze with vertical head movements and gaze fixation.   VOR Cancellation: intact  Head-Impulse Test: NT - due to limited cervical ROM, pain and guarding.   Dynamic Visual Acuity: NT   POSITIONAL TESTING: Right Dix-Hallpike: no nystagmus Left Dix-Hallpike: no nystagmus Right Roll Test: apogeotropic nystagmus and symptoms worse on right side Left Roll Test: no nystagmus  MOTION SENSITIVITY:  Motion Sensitivity Quotient Intensity: 0 = none, 1 = Lightheaded, 2 = Mild, 3 = Moderate, 4 = Severe, 5 = Vomiting  Intensity  1. Sitting to supine 4  2. Supine to L side 1  3. Supine to R side 4  4. Supine to sitting 3  5. L Hallpike-Dix 0  6. Up from L  0  7. R Hallpike-Dix 0  8. Up from R  3  9. Sitting, head tipped to L knee   10. Head up from L knee   11. Sitting, head tipped to R knee   12. Head up from R knee   13. Sitting head turns x5   14.Sitting head nods x5   15. In stance, 180 turn to L    16. In stance, 180 turn to R     OTHOSTATICS: not done  FUNCTIONAL GAIT: 03/14/23-  DGI 18/24      VESTIBULAR TREATMENT:                                                                                                   DATE:  03/29/23 Therapeutic Activity:  recheck of R horizontal canal with dix hallpike.  Negative.  Manual Therapy: to decrease muscle spasm and pain and improve mobility STM/TPR to bil UT/LS and cervical parspinals, R SCM and anterior scalenes, skilled palpation and monitoring during dry needling. Trigger Point Dry-Needling  Treatment instructions: Expect mild to moderate muscle soreness. S/S of pneumothorax if dry needled over a lung field, and to seek immediate medical attention should they occur. Patient verbalized understanding of these instructions and  education. Patient Consent Given: Yes Education handout  provided: Previously provided Muscles treated: R UT/LS, R C3, C4 multifidi Electrical stimulation performed: No Parameters: N/A Treatment response/outcome: Twitch Response Elicited and Palpable Increase in Muscle Length   03/23/23 Therapeutic Exercise: to improve strength and mobility.  Demo, verbal and tactile cues throughout for technique. Review of HEP: chin tucks, scap squeezes, shoulder rolls, levator stretch.  Therapeutic Activity:  recheck of R horizontal canal with dix hallpike.  Negative.  Manual Therapy: to decrease muscle spasm and pain and improve mobility STM/TPR to bil UT/LS and cervical parspinals, skilled palpation and monitoring during dry needling. Trigger Point Dry-Needling  Treatment instructions: Expect mild to moderate muscle soreness. S/S of pneumothorax if dry needled over a lung field, and to seek immediate medical attention should they occur. Patient verbalized understanding of these instructions and education. Patient Consent Given: Yes Education handout provided: Previously provided Muscles treated: bil UT/LS, bil C4 multifidi Electrical stimulation performed: No Parameters: N/A Treatment response/outcome: Twitch Response Elicited and Palpable Increase in Muscle Length   03/21/23 Canalith Repositioning: Epley Right: Number of Reps: 1 and Response to Treatment: symptoms resolved Self Care: Reviewed mechanics of BPPV and sleep positioning - she sleeps on her R side which is mostly likely why it keeps reoccuring on the R.  She will try to sleep in her recliner for a few days.  Also discussed traveling on plane and importance of hydration.  Manual Therapy: to decrease muscle spasm and pain and improve mobility STM/TPR to R UT/LS and cervical parspinals, skilled palpation and monitoring during dry needling. Trigger Point Dry-Needling  Treatment instructions: Expect mild to moderate muscle soreness. S/S of  pneumothorax if dry needled over a lung field, and to seek immediate medical attention should they occur. Patient verbalized understanding of these instructions and education. Patient Consent Given: Yes Education handout provided: Previously provided Muscles treated: R UT Electrical stimulation performed: No Parameters: N/A Treatment response/outcome: Twitch Response Elicited and Palpable Increase in Muscle Length   03/14/2023 Therapeutic Activity:  assessment of balance and VOR and oculomotor exam.   DGI - 18/24 - noted impaired gait with decreased L knee extension and pain.  Progression of HEP with VOR exercises, education on safety.  Manual Therapy: to decrease muscle spasm and pain and improve mobility STM/TPR to cervical paraspinals, SCM, anterior scalenes in sitting.  Modalities: MHP x 5 min to neck in sitting during session while educating.      PATIENT EDUCATION: Education details: HEP review Person educated: Patient Education method: Explanation, Demonstration, and Handouts Education comprehension: verbalized understanding and returned demonstration  HOME EXERCISE PROGRAM: Access Code: [email or web address] URL: [email or web address] Date: 03/14/2023 Prepared by: Harrie Foreman  Exercises - Seated Shoulder Rolls  - 3 x daily - 7 x weekly - 1 sets - 10 reps - Seated Cervical Retraction  - 3 x daily - 7 x weekly - 1 sets - 10 reps - Seated Scapular Retraction  - 3 x daily - 7 x weekly - 1 sets - 10 reps - Gentle Levator Scapulae Stretch  - 3 x daily - 7 x weekly - 1 sets - 3 reps - 10-15 sec hold - Seated Gaze Stabilization with Head Rotation  - 3 x daily - 7 x weekly - 3 sets - 10 reps - Seated Gaze Stabilization with Head Nod  - 3 x daily - 7 x weekly - 3 sets - 10 reps   GOALS: Goals reviewed with patient? Yes  SHORT TERM GOALS: Target date: 03/13/23  I in home program to manage vertigo  Baseline: Goal status: IN PROGRESS 03/14/23- compliant   LONG TERM  GOALS: Target date: 8 weeks, 04/24/23  Improve DHI to 20 Baseline: 58 Goal status: IN PROGRESS  2.  Complete DGI, score greater than19/24 for indication of reduced fall risk Baseline: NA at eval Goal status: IN PROGRESS 03/14/23 - 18/24 very limited by R knee pain.   3.  Gait speed, increase to 1 m/sec or faster to align with value concordant with efficient speed for community ambulation and safety Baseline: .24m/sec Goal status: IN PROGRESS   ASSESSMENT:  CLINICAL IMPRESSION: Cleotilde Neer reported no dizziness today and was negative for BPPV.  Feels TrDN is helping wit neck pain overall, although still has a lot of pain and tightness in R SCM, anterior scalenes, and Levator scapuale. MHP to low back in sitting during Manual therapy to decrease complaint low back pain from sleeping on back, also recommended trying pillow under knees.   Cleotilde Neer continues to demonstrate potential for improvement and would benefit from continued skilled therapy to address impairments.      OBJECTIVE IMPAIRMENTS: decreased activity tolerance, dizziness, hypomobility, impaired flexibility, and pain.   ACTIVITY LIMITATIONS: bending, sleeping, transfers, bed mobility, and reach over head  PARTICIPATION LIMITATIONS: meal prep, cleaning, laundry, driving, community activity, and yard work  PERSONAL FACTORS: Behavior pattern, Past/current experiences, Time since onset of injury/illness/exacerbation, and 3+ comorbidities: Dm, HTN, anemia  are also affecting patient's functional outcome.   REHAB POTENTIAL: Good  CLINICAL DECISION MAKING: Evolving/moderate complexity  EVALUATION COMPLEXITY: Low   PLAN:  PT FREQUENCY: 1-2x/week  PT DURATION: 8 weeks  PLANNED INTERVENTIONS: Therapeutic exercises, Therapeutic activity, Neuromuscular re-education, Balance training, Gait training, Patient/Family education, Self Care, Joint mobilization, Vestibular training, and Canalith repositioning  PLAN FOR NEXT  SESSION: retest for BPPV, continue to progress VOR exercises, manual therapy for cervicogenic component, balance training as tolerated.   Jena Gauss, PT, DPT  03/29/2023, 8:49 AM

## 2023-04-03 ENCOUNTER — Inpatient Hospital Stay (HOSPITAL_BASED_OUTPATIENT_CLINIC_OR_DEPARTMENT_OTHER): Payer: Medicare Other | Admitting: Family

## 2023-04-03 ENCOUNTER — Inpatient Hospital Stay: Payer: Medicare Other

## 2023-04-03 ENCOUNTER — Encounter: Payer: Self-pay | Admitting: Family

## 2023-04-03 ENCOUNTER — Inpatient Hospital Stay: Payer: Medicare Other | Attending: Hematology & Oncology

## 2023-04-03 VITALS — BP 144/56 | HR 77 | Temp 98.6°F | Resp 17 | Wt 169.0 lb

## 2023-04-03 DIAGNOSIS — D500 Iron deficiency anemia secondary to blood loss (chronic): Secondary | ICD-10-CM

## 2023-04-03 DIAGNOSIS — D508 Other iron deficiency anemias: Secondary | ICD-10-CM | POA: Diagnosis not present

## 2023-04-03 DIAGNOSIS — Z7901 Long term (current) use of anticoagulants: Secondary | ICD-10-CM | POA: Diagnosis not present

## 2023-04-03 DIAGNOSIS — Z79899 Other long term (current) drug therapy: Secondary | ICD-10-CM | POA: Diagnosis not present

## 2023-04-03 DIAGNOSIS — K909 Intestinal malabsorption, unspecified: Secondary | ICD-10-CM | POA: Diagnosis not present

## 2023-04-03 DIAGNOSIS — D4620 Refractory anemia with excess of blasts, unspecified: Secondary | ICD-10-CM | POA: Insufficient documentation

## 2023-04-03 LAB — CMP (CANCER CENTER ONLY)
ALT: 13 U/L (ref 0–44)
AST: 20 U/L (ref 15–41)
Albumin: 3.3 g/dL — ABNORMAL LOW (ref 3.5–5.0)
Alkaline Phosphatase: 75 U/L (ref 38–126)
Anion gap: 8 (ref 5–15)
BUN: 14 mg/dL (ref 8–23)
CO2: 26 mmol/L (ref 22–32)
Calcium: 8.9 mg/dL (ref 8.9–10.3)
Chloride: 99 mmol/L (ref 98–111)
Creatinine: 0.63 mg/dL (ref 0.44–1.00)
GFR, Estimated: >60 mL/min (ref >60)
Glucose, Bld: 178 mg/dL — ABNORMAL HIGH (ref 70–99)
Potassium: 4.6 mmol/L (ref 3.5–5.1)
Sodium: 133 mmol/L — ABNORMAL LOW (ref 135–145)
Total Bilirubin: 0.2 mg/dL — ABNORMAL LOW (ref 0.3–1.2)
Total Protein: 8.1 g/dL (ref 6.5–8.1)

## 2023-04-03 LAB — CBC WITH DIFFERENTIAL (CANCER CENTER ONLY)
Abs Immature Granulocytes: 0.01 10*3/uL (ref 0.00–0.07)
Basophils Absolute: 0 10*3/uL (ref 0.0–0.1)
Basophils Relative: 0 %
Eosinophils Absolute: 0 10*3/uL (ref 0.0–0.5)
Eosinophils Relative: 0 %
HCT: 34.6 % — ABNORMAL LOW (ref 36.0–46.0)
Hemoglobin: 11 g/dL — ABNORMAL LOW (ref 12.0–15.0)
Immature Granulocytes: 0 %
Lymphocytes Relative: 75 %
Lymphs Abs: 1.7 10*3/uL (ref 0.7–4.0)
MCH: 27 pg (ref 26.0–34.0)
MCHC: 31.8 g/dL (ref 30.0–36.0)
MCV: 84.8 fL (ref 80.0–100.0)
Monocytes Absolute: 0.4 10*3/uL (ref 0.1–1.0)
Monocytes Relative: 16 %
Neutro Abs: 0.2 10*3/uL — CL (ref 1.7–7.7)
Neutrophils Relative %: 9 %
Platelet Count: 139 10*3/uL — ABNORMAL LOW (ref 150–400)
RBC: 4.08 MIL/uL (ref 3.87–5.11)
RDW: 17.7 % — ABNORMAL HIGH (ref 11.5–15.5)
WBC Count: 2.3 10*3/uL — ABNORMAL LOW (ref 4.0–10.5)
nRBC: 0 % (ref 0.0–0.2)

## 2023-04-03 LAB — FERRITIN: Ferritin: 855 ng/mL — ABNORMAL HIGH (ref 11–307)

## 2023-04-03 NOTE — Progress Notes (Signed)
Hematology and Oncology Follow Up Visit  Kristen Murray [id number] 03-06-1946 77 y.o. 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 Hgb is stable at 11.0, MCV 84.  She has not noted any blood loss. No bruising or petechiae.  No issue with infections. WBC count is 2.3, ANC is 0.2.  No fever, chills, n/v, cough, rash, dizziness, SOB, chest pain, palpitations, abdominal pain or changes in bowel or bladder habits.  No swelling, tenderness, numbness or tingling in her extremities.  No falls or syncope reported.  Appetite and hydration are good. Weight is stable at 169 lbs.   ECOG Performance Status: 1 - Symptomatic but completely ambulatory  Medications:  Allergies as of 04/03/2023       Reactions   Codeine Hives, Other (See Comments)   Because of a history of documented adverse serious drug reaction, Medi Alert bracelet  is recommended   Macrodantin [nitrofurantoin Macrocrystal] Other (See Comments)   Numbness, aching all over   Other Shortness Of Breath, Rash, Other (See Comments)   Pine nuts & walnuts throat congestion. No documented angioedema.   Propoxyphene Hives, Swelling, Rash, Other (See Comments)   Propoxyphene Hcl Hives, Swelling, Rash   Dulaglutide Other (See Comments)   Other reaction(s): weak, ha, no appetite   Latex Rash, Other (See Comments)   Metformin Hcl Other (See Comments)   Other reaction(s): leg discomfort   Neosporin [bacitracin-polymyxin B] Other (See Comments)   Blisters   Zoster Vac Recomb Adjuvanted Rash, Other (See Comments)   Other reaction(s): confusion   Avocado Rash   Banana Rash   Ciprofloxacin Nausea Only, Other (See Comments)   Nausea (Note: Patient takes this when on mission  trips, however)   Tizanidine Other (See Comments)   Reaction not recalled   Tramadol Nausea Only        Medication List        Accurate as of April 03, 2023  2:25 PM. If you have any questions, ask your nurse or doctor.          acetaminophen 325 MG tablet Commonly known as: TYLENOL Take 325-650 mg by mouth every 6 (six) hours as needed (for pain).   ALPRAZolam 0.25 MG tablet Commonly known as: XANAX Take 0.25 mg by mouth 2 (two) times daily as needed.   apixaban 5 MG Tabs tablet Commonly known as: Eliquis Take 1 tablet (5 mg total) by mouth 2 (two) times daily.   azelastine 0.1 % nasal spray Commonly known as: ASTELIN Place into both nostrils 2 (two) times daily. Use in each nostril as directed   b complex vitamins capsule daily.   cetirizine 10 MG tablet Commonly known as: ZYRTEC Take 1 tablet (10 mg total) by mouth daily as needed for allergies (Can take an extra dose during flare ups.).   Cholecalciferol 25 MCG (1000 UT) capsule Take 1,000 Units by mouth daily.   clotrimazole-betamethasone cream Commonly known as: Lotrisone Apply 1 Application topically 2 (two) times daily. Use for 2 weeks as needed.  Apply to area twice weekly at bedtime for maintenance dosing.   cyclobenzaprine 10 MG tablet Commonly known as: FLEXERIL Take 2.5 mg by mouth  daily as needed for muscle spasms.   dextromethorphan-guaiFENesin 30-600 MG 12hr tablet Commonly known as: MUCINEX DM Take 1 tablet by mouth 2 (two) times daily.   diclofenac 0.1 % ophthalmic solution Commonly known as: VOLTAREN 4 (four) times daily as needed.   DropSafe Alcohol Prep 70 % Pads Apply topically.   EPINEPHrine 0.3 mg/0.3 mL Soaj injection Commonly known as: EPI-PEN Inject 0.3 mg into the muscle as needed for anaphylaxis.   fluorometholone 0.1 % ophthalmic suspension Commonly known as: FML Place 1 drop into both eyes every Monday, Wednesday, and Friday.   fluticasone 50 MCG/ACT nasal  spray Commonly known as: FLONASE Place 1 spray into both nostrils 2 (two) times daily.   folic acid 400 MCG tablet Commonly known as: FOLVITE daily.   freestyle lancets Check blood sugar once daily as directed DX:790.29 (FREESTYLE FREEDOM LITE)   GaviLyte-G 236 g solution Generic drug: polyethylene glycol   glipiZIDE 10 MG 24 hr tablet Commonly known as: GLUCOTROL XL Take 10 mg by mouth daily.   glucose blood test strip Inject 1 strip into the skin daily.   glucose blood test strip Commonly known as: FREESTYLE LITE CHECK BLOOD SUGAR ONCE DAILY AS DIRECTED.  DX:790.29   ipratropium 0.06 % nasal spray Commonly known as: ATROVENT Place 2 sprays into both nostrils 3 (three) times daily.   lidocaine 5 % ointment Commonly known as: XYLOCAINE Apply 1 application topically 3 (three) times daily as needed.   loratadine 10 MG tablet Commonly known as: CLARITIN Take 1 tablet (10 mg total) by mouth daily.   metoprolol tartrate 50 MG tablet Commonly known as: LOPRESSOR TAKE 1 AND 1/2 TABLETS TWICE DAILY   montelukast 10 MG tablet Commonly known as: SINGULAIR TAKE 1 TABLET BY MOUTH AT  BEDTIME   multivitamin tablet Take 1 tablet by mouth daily. Shaklee brand   nitroGLYCERIN 0.4 MG SL tablet Commonly known as: NITROSTAT Place 1 tablet (0.4 mg total) under the tongue every 5 (five) minutes as needed for chest pain.   pantoprazole 40 MG tablet Commonly known as: PROTONIX Take 1 tablet (40 mg total) by mouth 2 (two) times daily.   rosuvastatin 5 MG tablet Commonly known as: CRESTOR Take 5 mg by mouth See admin instructions. Take 5 mg by mouth at bedtime on Mondays and Thursdays..Currently on hold   tretinoin 0.025 % cream Commonly known as: RETIN-A Apply 1 application. topically at bedtime.   trolamine salicylate 10 % cream Commonly known as: ASPERCREME Apply 1 application topically as needed for muscle pain.   valACYclovir 500 MG tablet Commonly known as:  VALTREX Take 1 tablet (500 mg total) by mouth 2 (two) times daily. Take twice a day for thee days as needed for an outbreak.   verapamil 180 MG CR tablet Commonly known as: CALAN-SR Take 180 mg by mouth at bedtime.        Allergies:  Allergies  Allergen Reactions   Codeine Hives and Other (See Comments)    Because of a history of documented adverse serious drug reaction, Medi Alert bracelet  is recommended   Macrodantin [Nitrofurantoin Macrocrystal] Other (See Comments)    Numbness, aching all over   Other Shortness Of Breath, Rash and Other (See Comments)    Pine nuts & walnuts throat congestion. No documented angioedema.    Propoxyphene Hives, Swelling, Rash and Other (See Comments)   Propoxyphene Hcl Hives, Swelling and Rash   Dulaglutide Other (See Comments)    Other reaction(s): weak, ha, no appetite  Latex Rash and Other (See Comments)   Metformin Hcl Other (See Comments)    Other reaction(s): leg discomfort   Neosporin [Bacitracin-Polymyxin B] Other (See Comments)    Blisters   Zoster Vac Recomb Adjuvanted Rash and Other (See Comments)    Other reaction(s): confusion   Avocado Rash   Banana Rash   Ciprofloxacin Nausea Only and Other (See Comments)    Nausea (Note: Patient takes this when on mission trips, however)   Tizanidine Other (See Comments)    Reaction not recalled   Tramadol Nausea Only    Past Medical History, Surgical history, Social history, and Family History were reviewed and updated.  Review of Systems: All other 10 point review of systems is negative.   Physical Exam:  weight is 169 lb 0.6 oz (76.7 kg). Her oral temperature is 98.6 F (37 C). Her blood pressure is 144/56 (abnormal) and her pulse is 77. Her respiration is 17 and oxygen saturation is 98%.   Wt Readings from Last 3 Encounters:  04/03/23 169 lb 0.6 oz (76.7 kg)  02/27/23 167 lb (75.8 kg)  01/09/23 168 lb 12.8 oz (76.6 kg)    Ocular: Sclerae unicteric, pupils equal, round and  reactive to light Ear-nose-throat: Oropharynx clear, dentition fair Lymphatic: No cervical or supraclavicular adenopathy Lungs no rales or rhonchi, good excursion bilaterally Heart regular rate and rhythm, no murmur appreciated Abd soft, nontender, positive bowel sounds MSK no focal spinal tenderness, no joint edema Neuro: non-focal, well-oriented, appropriate affect Breasts: Deferred  Lab Results  Component Value Date   WBC 2.3 (L) 04/03/2023   HGB 11.0 (L) 04/03/2023   HCT 34.6 (L) 04/03/2023   MCV 84.8 04/03/2023   PLT 139 (L) 04/03/2023   Lab Results  Component Value Date   FERRITIN 789 (H) 02/20/2023   IRON 92 02/20/2023   TIBC 223 (L) 02/20/2023   UIBC 131 (L) 02/20/2023   IRONPCTSAT 41 (H) 02/20/2023   Lab Results  Component Value Date   RETICCTPCT 0.8 07/20/2022   RBC 4.08 04/03/2023   No results found for: "KPAFRELGTCHN", "LAMBDASER", "KAPLAMBRATIO" No results found for: "IGGSERUM", "IGA", "IGMSERUM" No results found for: "TOTALPROTELP", "ALBUMINELP", "A1GS", "A2GS", "BETS", "BETA2SER", "GAMS", "MSPIKE", "SPEI"   Chemistry      Component Value Date/Time   NA 137 02/20/2023 1335   NA 138 03/10/2020 1138   K 4.3 02/20/2023 1335   CL 100 02/20/2023 1335   CO2 32 02/20/2023 1335   BUN 15 02/20/2023 1335   BUN 10 03/10/2020 1138   CREATININE 0.72 02/20/2023 1335      Component Value Date/Time   CALCIUM 9.7 02/20/2023 1335   ALKPHOS 78 02/20/2023 1335   AST 15 02/20/2023 1335   ALT 12 02/20/2023 1335   BILITOT 0.3 02/20/2023 1335       Impression and Plan: Ms. Shellito is a very pleasant 77 yo African American female with low-grade MDS, low IPSS-R score.  ESA held, Hgb 11.0.  No Neulasta needed at this time.  Iron studies are pending.  We will follow-up in 6 weeks  Eileen Stanford, NP 6/4/20242:25 PM

## 2023-04-04 LAB — IRON AND IRON BINDING CAPACITY (CC-WL,HP ONLY)
Iron: 123 ug/dL (ref 28–170)
Saturation Ratios: 56 % — ABNORMAL HIGH (ref 10.4–31.8)
TIBC: 221 ug/dL — ABNORMAL LOW (ref 250–450)
UIBC: 98 ug/dL — ABNORMAL LOW (ref 148–442)

## 2023-04-05 ENCOUNTER — Encounter (HOSPITAL_BASED_OUTPATIENT_CLINIC_OR_DEPARTMENT_OTHER): Payer: Self-pay | Admitting: Emergency Medicine

## 2023-04-05 ENCOUNTER — Emergency Department (HOSPITAL_BASED_OUTPATIENT_CLINIC_OR_DEPARTMENT_OTHER)
Admission: EM | Admit: 2023-04-05 | Discharge: 2023-04-05 | Disposition: A | Discharge status: Home / Self Care | Payer: Medicare Other | Attending: Emergency Medicine | Admitting: Emergency Medicine

## 2023-04-05 ENCOUNTER — Emergency Department (HOSPITAL_BASED_OUTPATIENT_CLINIC_OR_DEPARTMENT_OTHER): Payer: Medicare Other

## 2023-04-05 ENCOUNTER — Ambulatory Visit: Payer: Medicare Other | Admitting: Physical Therapy

## 2023-04-05 ENCOUNTER — Other Ambulatory Visit: Payer: Self-pay

## 2023-04-05 DIAGNOSIS — M545 Low back pain, unspecified: Secondary | ICD-10-CM | POA: Diagnosis not present

## 2023-04-05 DIAGNOSIS — Z91040 Latex allergy status: Secondary | ICD-10-CM | POA: Insufficient documentation

## 2023-04-05 DIAGNOSIS — M25552 Pain in left hip: Secondary | ICD-10-CM | POA: Diagnosis present

## 2023-04-05 DIAGNOSIS — Z7901 Long term (current) use of anticoagulants: Secondary | ICD-10-CM | POA: Insufficient documentation

## 2023-04-05 MED ORDER — KETOROLAC TROMETHAMINE 15 MG/ML IJ SOLN
15.0000 mg | Freq: Once | INTRAMUSCULAR | Status: AC
  Administered 2023-04-05: 15 mg via INTRAVENOUS
  Filled 2023-04-05: qty 1

## 2023-04-05 MED ORDER — OXYCODONE-ACETAMINOPHEN 5-325 MG PO TABS
1.0000 | ORAL_TABLET | Freq: Once | ORAL | Status: AC
  Administered 2023-04-05: 1 via ORAL
  Filled 2023-04-05: qty 1

## 2023-04-05 NOTE — ED Notes (Signed)
Patient transported to X-ray 

## 2023-04-05 NOTE — ED Provider Notes (Signed)
[facility] Provider Note   CSN: [id number] Arrival date & time: 04/05/23  1312     History  Chief Complaint  Patient presents with   Leg Pain    Kristen Murray is a 77 y.o. female presented to ED complaining of pain in her left hip.  Patient reports that she woke up this morning was having significant pain in her left hip.  Pain is worse with any movement and trying to bear weight.  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  She denies any falls or traumas.  She is on Eliquis for a flutter.  She took tramadol last night with little relief of her pain.  She denies any history of DVT, PE, smoking, or vascular surgery.  HPI     Home Medications Prior to Admission medications   Medication Sig Start Date End Date Taking? Authorizing Provider  acetaminophen (TYLENOL) 325 MG tablet Take 325-650 mg by mouth every 6 (six) hours as needed (for pain).    [provider]  Alcohol Swabs (DROPSAFE ALCOHOL PREP) 70 % PADS Apply topically. 10/19/22   [provider]  ALPRAZolam Prudy Feeler) 0.25 MG tablet Take 0.25 mg by mouth 2 (two) times daily as needed. 04/19/21   [provider]  apixaban (ELIQUIS) 5 MG TABS tablet Take 1 tablet (5 mg total) by mouth 2 (two) times daily. 11/17/22   Nahser, Deloris Ping, MD  azelastine (ASTELIN) 0.1 % nasal spray Place into both nostrils 2 (two) times daily. Use in each nostril as directed    [provider]  b complex vitamins capsule daily.    [provider]  cetirizine (ZYRTEC) 10 MG tablet Take 1 tablet (10 mg total) by mouth daily as needed for allergies (Can take an extra dose during flare ups.). 05/30/22   Kozlow, Alvira Philips, MD  Cholecalciferol 25 MCG (1000 UT) capsule Take 1,000 Units by mouth daily.    [provider]  clotrimazole-betamethasone (LOTRISONE) cream Apply 1 Application topically 2 (two) times daily. Use for 2 weeks as  needed.  Apply to area twice weekly at bedtime for maintenance dosing. 02/27/23   Patton Salles, MD  cyclobenzaprine (FLEXERIL) 10 MG tablet Take 2.5 mg by mouth daily as needed for muscle spasms.    [provider]  dextromethorphan-guaiFENesin (MUCINEX DM) 30-600 MG 12hr tablet Take 1 tablet by mouth 2 (two) times daily. 05/30/22   Kozlow, Alvira Philips, MD  diclofenac (VOLTAREN) 0.1 % ophthalmic solution 4 (four) times daily as needed.    [provider]  EPINEPHrine 0.3 mg/0.3 mL IJ SOAJ injection Inject 0.3 mg into the muscle as needed for anaphylaxis. 10/03/21   Nehemiah Settle, FNP  fluorometholone (FML) 0.1 % ophthalmic suspension Place 1 drop into both eyes every Monday, Wednesday, and Friday.     [provider]  fluticasone (FLONASE) 50 MCG/ACT nasal spray Place 1 spray into both nostrils 2 (two) times daily. 05/30/22   Kozlow, Alvira Philips, MD  folic acid (FOLVITE) 400 MCG tablet daily.    [provider]  GAVILYTE-G 236 g solution     [provider]  glipiZIDE (GLUCOTROL XL) 10 MG 24 hr tablet Take 10 mg by mouth daily. 05/05/22   [provider]  glucose blood (FREESTYLE LITE) test strip CHECK BLOOD SUGAR ONCE DAILY AS DIRECTED.  DX:790.29 01/09/14   Pecola Lawless, MD  glucose blood test  strip Inject 1 strip into the skin daily.    [provider]  ipratropium (ATROVENT) 0.06 % nasal spray Place 2 sprays into both nostrils 3 (three) times daily. 05/30/22   Kozlow, Alvira Philips, MD  Lancets (FREESTYLE) lancets Check blood sugar once daily as directed DX:790.29 (FREESTYLE FREEDOM LITE) 01/09/14   Pecola Lawless, MD  lidocaine (XYLOCAINE) 5 % ointment Apply 1 application topically 3 (three) times daily as needed. 06/06/21   Patton Salles, MD  loratadine (CLARITIN) 10 MG tablet Take 1 tablet (10 mg total) by mouth daily. 01/10/20   Jacalyn Lefevre, MD  metoprolol tartrate (LOPRESSOR) 50 MG tablet TAKE 1 AND 1/2 TABLETS TWICE DAILY  11/17/22   Nahser, Deloris Ping, MD  montelukast (SINGULAIR) 10 MG tablet TAKE 1 TABLET BY MOUTH AT  BEDTIME 11/17/22   Kozlow, Alvira Philips, MD  Multiple Vitamin (MULTIVITAMIN) tablet Take 1 tablet by mouth daily. Shaklee brand    [provider]  nitroGLYCERIN (NITROSTAT) 0.4 MG SL tablet Place 1 tablet (0.4 mg total) under the tongue every 5 (five) minutes as needed for chest pain. 01/11/21   Tereso Newcomer T, PA-C  pantoprazole (PROTONIX) 40 MG tablet Take 1 tablet (40 mg total) by mouth 2 (two) times daily. 05/30/22   Kozlow, Alvira Philips, MD  rosuvastatin (CRESTOR) 5 MG tablet Take 5 mg by mouth See admin instructions. Take 5 mg by mouth at bedtime on Mondays and Thursdays..Currently on hold 11/20/19   [provider]  tretinoin (RETIN-A) 0.025 % cream Apply 1 application. topically at bedtime. 12/14/21   [provider]  trolamine salicylate (ASPERCREME) 10 % cream Apply 1 application topically as needed for muscle pain.    [provider]  valACYclovir (VALTREX) 500 MG tablet Take 1 tablet (500 mg total) by mouth 2 (two) times daily. Take twice a day for thee days as needed for an outbreak. 02/27/23   Patton Salles, MD  verapamil (CALAN-SR) 180 MG CR tablet Take 180 mg by mouth at bedtime. 01/02/17   [provider]      Allergies    Codeine, Macrodantin [nitrofurantoin macrocrystal], Other, Propoxyphene, Propoxyphene hcl, Dulaglutide, Latex, Metformin hcl, Neosporin [bacitracin-polymyxin b], Zoster vac recomb adjuvanted, Avocado, Banana, Ciprofloxacin, Tizanidine, and Tramadol    Review of Systems   Review of Systems  Physical Exam Updated Vital Signs BP 135/68 (BP Location: Left Arm)   Pulse 78   Temp 99.1 F (37.3 C) (Oral)   Resp 18   Ht 5' 4.5" (1.638 m)   Wt 76.7 kg   LMP 07/30/1990   SpO2 95%   BMI 28.56 kg/m  Physical Exam Constitutional:      General: She is not in acute distress. HENT:     Head: Normocephalic and atraumatic.  Eyes:      Conjunctiva/sclera: Conjunctivae normal.     Pupils: Pupils are equal, round, and reactive to light.  Cardiovascular:     Rate and Rhythm: Normal rate and regular rhythm.     Pulses: Normal pulses.  Pulmonary:     Effort: Pulmonary effort is normal. No respiratory distress.  Abdominal:     General: There is no distension.     Tenderness: There is no abdominal tenderness.  Musculoskeletal:     Comments: Pain with any movement of the left hip, specifically hip flexion.  Tenderness along the left inguinal fold  Skin:    General: Skin is warm and dry.  Neurological:  General: No focal deficit present.     Mental Status: She is alert and oriented to person, place, and time. Mental status is at baseline.     Cranial Nerves: No cranial nerve deficit.     Sensory: No sensory deficit.     Motor: No weakness.  Psychiatric:        Mood and Affect: Mood normal.        Behavior: Behavior normal.     ED Results / Procedures / Treatments   Labs (all labs ordered are listed, but only abnormal results are displayed) Labs Reviewed - No data to display  EKG None  Radiology DG Hip Unilat W or Wo Pelvis 2-3 Views Left  Result Date: 04/05/2023 CLINICAL DATA:  Acute worsening of the left hip pain. EXAM: DG HIP (WITH OR WITHOUT PELVIS) 3V LEFT COMPARISON:  Pelvis x-ray 04/06/2022 FINDINGS: Hyperostosis. No fracture or dislocation. Slight joint space loss of the hip joints. Minimal sclerosis along the sacroiliac joints. Incidental note is made of diffuse colonic stool the visualized colon. IMPRESSION: Hyperostosis.  Mild degenerative change. Electronically Signed   By: Karen Kays M.D.   On: 04/05/2023 17:33   US Venous Img Lower Unilateral Left  Result Date: 04/05/2023 CLINICAL DATA:  Left lower extremity pain and edema. EXAM: LEFT LOWER EXTREMITY VENOUS DOPPLER ULTRASOUND TECHNIQUE: Gray-scale sonography with graded compression, as well as color Doppler and duplex ultrasound were performed  to evaluate the lower extremity deep venous systems from the level of the common femoral vein and including the common femoral, femoral, profunda femoral, popliteal and calf veins including the posterior tibial, peroneal and gastrocnemius veins when visible. The superficial great saphenous vein was also interrogated. Spectral Doppler was utilized to evaluate flow at rest and with distal augmentation maneuvers in the common femoral, femoral and popliteal veins. COMPARISON:  04/14/2019 FINDINGS: Contralateral Common Femoral Vein: Respiratory phasicity is normal and symmetric with the symptomatic side. No evidence of thrombus. Normal compressibility. Common Femoral Vein: No evidence of thrombus. Normal compressibility, respiratory phasicity and response to augmentation. Saphenofemoral Junction: No evidence of thrombus. Normal compressibility and flow on color Doppler imaging. Profunda Femoral Vein: No evidence of thrombus. Normal compressibility and flow on color Doppler imaging. Femoral Vein: No evidence of thrombus. Normal compressibility, respiratory phasicity and response to augmentation. Popliteal Vein: No evidence of thrombus. Normal compressibility, respiratory phasicity and response to augmentation. Calf Veins: No evidence of thrombus. Normal compressibility and flow on color Doppler imaging. Superficial Great Saphenous Vein: No evidence of thrombus. Normal compressibility. Venous Reflux:  None. Other Findings: No evidence of superficial thrombophlebitis or abnormal fluid collection. IMPRESSION: No evidence of left lower extremity deep venous thrombosis. Electronically Signed   By: Irish Lack M.D.   On: 04/05/2023 14:59    Procedures Procedures    Medications Ordered in ED Medications  oxyCODONE-acetaminophen (PERCOCET/ROXICET) 5-325 MG per tablet 1 tablet (1 tablet Oral Given 04/05/23 1705)  ketorolac (TORADOL) 15 MG/ML injection 15 mg (15 mg Intravenous Given 04/05/23 1714)    ED Course/ Medical  Decision Making/ A&P                             Medical Decision Making Amount and/or Complexity of Data Reviewed Radiology: ordered.  Risk Prescription drug management.   Patient is here with left inguinal pain and discomfort with left hip movement, spontaneous onset this morning.  Differential would include osteoarthritis versus occult fracture versus DVT versus inguinal lymphadenopathy  versus other.  Vascular ultrasound and x-ray imaging were ordered and personally reviewed and interpreted, notable for no acute occlusion or DVT, and some mild arthritic changes without evident fracture.  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  Oral and IM pain medications were given   Patient is neurovascularly intact on arrival.  Low suspicion for aortic occlusion, vascular ischemia  Low suspicion for cellulitis or infection.  Low suspicion for bowel herniation, no evidence of obstruction or GI complaint.  No red flags for cauda equina syndrome.  No indication for emergent MRI imaging of the spine at this time.  Low suspicion for pelvic or hip fracture.  Patient has a walker at home which she can use more consistently, her husband will be taking her home.        Final Clinical Impression(s) / ED Diagnoses Final diagnoses:  Left hip pain    Rx / DC Orders ED Discharge Orders     None         , Kermit Balo, MD 04/05/23 4098

## 2023-04-05 NOTE — Discharge Instructions (Signed)
You can take Tylenol at home for pain every 6 hours.  You can use your home tramadol "as needed" for severe pain.  You can also try heating packs as needed to your hip.  Please use your walker at all times at home.  This is for extra stability.  If your pain is getting worse, or you are losing feeling in your left leg, or your foot or toes become discolored, turning blue, purple, or blanching white, please return to the hospital immediately.  These may be signs of a worsening nerve problem or blood flow problem in your leg.

## 2023-04-05 NOTE — ED Triage Notes (Signed)
Patient arrives in wheelchair by POV c/o stiffness and pain to left leg. Pain starts in thigh area and works its way all the way down to ankle. Denies any injury. Reports swelling to knee and ankle last night.

## 2023-04-10 ENCOUNTER — Encounter: Payer: Self-pay | Admitting: Cardiovascular Disease

## 2023-04-10 ENCOUNTER — Ambulatory Visit: Payer: Medicare Other | Attending: Physician Assistant | Admitting: Physical Therapy

## 2023-04-10 DIAGNOSIS — R42 Dizziness and giddiness: Secondary | ICD-10-CM | POA: Diagnosis present

## 2023-04-10 DIAGNOSIS — M542 Cervicalgia: Secondary | ICD-10-CM | POA: Diagnosis present

## 2023-04-10 DIAGNOSIS — H8111 Benign paroxysmal vertigo, right ear: Secondary | ICD-10-CM | POA: Diagnosis present

## 2023-04-10 NOTE — Progress Notes (Unsigned)
Cardiology Office Note:    Date:  04/11/2023   ID:  Kristen Murray, DOB [birth year], MRN [id number]  PCP:  Delma Officer, PA  [facility] Cardiologist:  Kristeen Miss, MD  [facility] Electrophysiologist:  None   Referring MD: Kirby Funk, MD   Chief Complaint  Patient presents with   Atrial Flutter    Previous notes.:    Kristen Murray is a 77 y.o. female with a hx of diabetes mellitus, reflux, hyperlipidemia, hypertension and recent diagnosis of atrial flutter.  I met her during the hospitalization in March, 2021 when she presented with atrial flutter. Echocardiogram at that time reveals normal left ventricular systolic function.  There was no significant valvular abnormalities.  Lexiscan Myoview study performed on January 30, 2020 reveals normal left ventricular systolic function with no evidence of ischemia.  Troponin levels were normal   Has had some palpitations Has rare episodes of jaw pan .   Is exercising in her house .   Has not gone outside much   February 14, 2021:  Seen for follow up of her atrial flutter  Has palpitations in the am ,  Is wearing an event monitor .  Does not sleep well at night.   Wonders if she has OSA .   She was having some jaw pain and ches pain  She had a coronary CT angiogram.  She has a coronary calcium score of 1 which places her in the 38th percentile for age and sex matched controls.  The left main is normal The left anterior descending artery is a large vessel with no plaque The left circumflex artery is a nondominant vessel that gives rise to a large OM1 in both the left circumflex and the obtuse marginal arteries are normal. The right coronary artery has very mild plaque in the mid right coronary artery (0 to 24% stenosis).   Nov. 2, 2022: Kristen Murray is seen for follow up of her atrial flutter  Cp Coronary CT angio shows minimal CAD   Feeling well Has had lots of fatigue,   has iron infusions periodically  Sees hematology    Stil has some atypical CP  Complains of being irritable in the afternoon   April 10, 2023 Kristen Murray is seen for follow up of her atrial flutter and CP  Cor CT angio shows minimal CAD  Has chronic anemia .  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   Nephrolithiasis  Dr Logan Bores, [facility], Hx of [3][   Polyp of colon    Radiculopathy     Past Surgical History:  Procedure Laterality Date   ABDOMINAL HYSTERECTOMY     TAH/BSO --fibroids, no cancer   BALLOON DILATION N/A 07/20/2014   Procedure: BALLOON DILATION;  Surgeon: Charolett Bumpers, MD;  Location: [facility];  Service: Endoscopy;  Laterality: N/A;   BREAST BIOPSY     Right   CARPAL TUNNEL RELEASE     & trigger thumb RUE; Dr Chaney Malling   COLONOSCOPY W/ POLYPECTOMY  07/2004   Neg 2011; Dr Danise Edge   CYSTOSCOPY  11/2009   Neg   ESOPHAGOGASTRODUODENOSCOPY N/A 07/20/2014   Procedure: ESOPHAGOGASTRODUODENOSCOPY (EGD);  Surgeon: Charolett Bumpers, MD;  Location: [facility];  Service: Endoscopy;  Laterality: N/A;    FLEXIBLE BRONCHOSCOPY W/ UPPER ENDOSCOPY     neg   KNEE ARTHROSCOPY     Left   ROTATOR CUFF REPAIR     R shoulder   TONSILLECTOMY     TRIGGER FINGER RELEASE Right    Right Thumb    Current Medications: Current Meds  Medication Sig   acetaminophen (TYLENOL) 325 MG tablet Take 325-650 mg by mouth every 6 (six) hours as needed (for pain).   Alcohol Swabs (DROPSAFE ALCOHOL PREP) 70 % PADS Apply topically.   ALPRAZolam (XANAX) 0.25 MG tablet Take 0.25 mg by mouth 2 (two) times daily as needed.   apixaban (ELIQUIS) 5 MG TABS tablet Take 1 tablet (5 mg total) by mouth 2 (two) times daily.   azelastine (ASTELIN) 0.1 % nasal spray Place into both nostrils 2 (two) times daily. Use in each nostril as directed   b complex vitamins capsule daily.   cetirizine (ZYRTEC) 10 MG tablet Take 1 tablet (10 mg total) by mouth daily as needed for allergies (Can take an extra dose during flare ups.).   Cholecalciferol 25 MCG (1000 UT) capsule Take 1,000 Units by mouth daily.   clotrimazole-betamethasone (LOTRISONE) cream Apply 1 Application topically 2 (two) times daily. Use for 2 weeks as needed.  Apply to area twice weekly at bedtime for maintenance dosing.   cyclobenzaprine (FLEXERIL) 10 MG tablet Take 2.5 mg by mouth daily as needed for muscle spasms.   dextromethorphan-guaiFENesin (MUCINEX DM) 30-600 MG 12hr tablet Take 1 tablet by mouth 2 (two) times daily.   diclofenac (VOLTAREN) 0.1 % ophthalmic solution 4 (four) times daily as needed.   EPINEPHrine 0.3 mg/0.3 mL IJ SOAJ injection Inject 0.3 mg into the muscle as needed for anaphylaxis.   fluorometholone (FML) 0.1 % ophthalmic suspension Place 1 drop into both eyes every Monday, Wednesday, and Friday.    fluticasone (FLONASE) 50 MCG/ACT nasal spray Place 1 spray into both nostrils 2 (two) times daily.   folic acid (FOLVITE) 400 MCG tablet daily.   GAVILYTE-G 236 g solution    glipiZIDE (GLUCOTROL XL) 10 MG 24 hr tablet Take 10 mg by mouth daily.    glucose blood (FREESTYLE LITE) test strip CHECK BLOOD SUGAR ONCE DAILY AS DIRECTED.  DX:790.29   glucose blood test strip Inject 1 strip into the skin daily.   ipratropium (ATROVENT) 0.06 % nasal spray Place 2 sprays into both nostrils 3 (three) times daily.   Lancets (FREESTYLE) lancets Check blood sugar once daily as directed DX:790.29 (FREESTYLE FREEDOM LITE)   lidocaine (XYLOCAINE) 5 % ointment Apply 1 application topically 3 (three) times daily as needed.   loratadine (CLARITIN) 10 MG tablet Take 1 tablet (10 mg  total) by mouth daily.   metoprolol tartrate (LOPRESSOR) 50 MG tablet TAKE 1 AND 1/2 TABLETS TWICE DAILY   montelukast (SINGULAIR) 10 MG tablet TAKE 1 TABLET BY MOUTH AT  BEDTIME   Multiple Vitamin (MULTIVITAMIN) tablet Take 1 tablet by mouth daily. Shaklee brand   nitroGLYCERIN (NITROSTAT) 0.4 MG SL tablet Place 1 tablet (0.4 mg total) under the tongue every 5 (five) minutes as needed for chest pain.   pantoprazole (PROTONIX) 40 MG tablet Take 1 tablet (40 mg total) by mouth 2 (two) times daily.   rosuvastatin (CRESTOR) 5 MG tablet Take 5 mg by mouth See admin instructions. Take 5 mg by mouth at bedtime on Mondays and Thursdays..Currently on hold   tretinoin (RETIN-A) 0.025 % cream Apply 1 application. topically at bedtime.   trolamine salicylate (ASPERCREME) 10 % cream Apply 1 application topically as needed for muscle pain.   valACYclovir (VALTREX) 500 MG tablet Take 1 tablet (500 mg total) by mouth 2 (two) times daily. Take twice a day for thee days as needed for an outbreak.   verapamil (CALAN-SR) 180 MG CR tablet Take 180 mg by mouth at bedtime.     Allergies:   Codeine, Macrodantin [nitrofurantoin macrocrystal], Other, Propoxyphene, Propoxyphene hcl, Dulaglutide, Latex, Metformin hcl, Neosporin [bacitracin-polymyxin b], Zoster vac recomb adjuvanted, Avocado, Banana, Ciprofloxacin, Tizanidine, and Tramadol   Social History   Socioeconomic History   Marital status: Married     Spouse name: Not on file   Number of children: Not on file   Years of education: Not on file   Highest education level: Not on file  Occupational History   Occupation: Mudlogger: [employer]  Tobacco Use   Smoking status: Never   Smokeless tobacco: Never  Vaping Use   Vaping Use: Never used  Substance and Sexual Activity   Alcohol use: No    Alcohol/week: 0.0 standard drinks of alcohol   Drug use: No   Sexual activity: Not Currently    Birth control/protection: Post-menopausal    Comment: 1st intercourse- 20  partners - 1  Other Topics Concern   Not on file  Social History Narrative   Regular Exercise-no         Social Determinants of Health   Financial Resource Strain: Not on file  Food Insecurity: Not on file  Transportation Needs: Not on file  Physical Activity: Not on file  Stress: Not on file  Social Connections: Not on file     Family History: The patient's family history includes Alcohol abuse in her mother; Diabetes in her maternal aunt, maternal grandmother, and maternal uncle; Other in her father; Tuberculosis in her maternal uncle. There is no history of Coronary artery disease.  Kristen Murray:   Please see the history of present illness.     All other systems reviewed and are negative.  EKGs/Labs/Other Studies Reviewed:    The following studies were reviewed today:   EKG:     April 11, 2023:  NSR at 57.  No ST or T wave changes    Recent Labs: 10/04/2022: NT-Pro BNP 631 04/03/2023: ALT 13; BUN 14; Creatinine 0.63; Hemoglobin 11.0; Platelet Count 139; Potassium 4.6; Sodium 133  Recent Lipid Panel    Component Value Date/Time   CHOL 164 01/27/2020 1835   TRIG 85 01/27/2020 1835   HDL 44 01/27/2020 1835   CHOLHDL 3.7 01/27/2020 1835   VLDL 17 01/27/2020 1835   LDLCALC 103 (H) 01/27/2020 1835    Physical  Exam:    Physical Exam: Blood pressure 136/70, pulse 73, height 5' 4.5" (1.638 m), weight 168 lb 9.6 oz (76.5 kg), last  menstrual period 07/30/1990, SpO2 96 %.       GEN:  Well nourished, well developed in no acute distress HEENT: Normal NECK: No JVD; No carotid bruits LYMPHATICS: No lymphadenopathy CARDIAC: RRR , no murmurs, rubs, gallops RESPIRATORY:  Clear to auscultation without rales, wheezing or rhonchi  ABDOMEN: Soft, non-tender, non-distended MUSCULOSKELETAL:  No edema; No deformity  SKIN: Warm and dry NEUROLOGIC:  Alert and oriented x 3     ASSESSMENT:    1. Atrial flutter, paroxysmal (HCC)   2. SVT (supraventricular tachycardia)     PLAN:       Atrial Flutter:     :  remains in NSR   2.  Hypertension:     BP is well controlled. .  Cont current meds.    3.  Hyperlipidemia:   -  stable   4.  Anemia :  has a myeloplastic syndrome   Medication Adjustments/Labs and Tests Ordered: Current medicines are reviewed at length with the patient today.  Concerns regarding medicines are outlined above.  Orders Placed This Encounter  Procedures   EKG 12-Lead    No orders of the defined types were placed in this encounter.     Patient Instructions  Medication Instructions:  Your physician recommends that you continue on your current medications as directed. Please refer to the Current Medication list given to you today.  *If you need a refill on your cardiac medications before your next appointment, please call your pharmacy*   Lab Work: None If you have labs (blood work) drawn today and your tests are completely normal, you will receive your results only by: MyChart Message (if you have MyChart) OR A paper copy in the mail If you have any lab test that is abnormal or we need to change your treatment, we will call you to review the results.   Testing/Procedures: None   Follow-Up: At [facility], you and your health needs are our priority.  As part of our continuing mission to provide you with exceptional heart care, we have created designated Provider Care Teams.   These Care Teams include your primary Cardiologist (physician) and Advanced Practice Providers (APPs -  Physician Assistants and Nurse Practitioners) who all work together to provide you with the care you need, when you need it.  Your next appointment:   1 year(s)  Provider:   Kristeen Miss, MD     Signed, Kristeen Miss, MD  04/11/2023 5:55 PM    [facility]

## 2023-04-10 NOTE — Therapy (Signed)
OUTPATIENT PHYSICAL THERAPY VESTIBULAR TREATMENT     Patient Name: Kristen Murray MRN: [id number] DOB:1946-06-22, 77 y.o., female Today's Date: 04/10/2023  END OF SESSION:  PT End of Session - 04/10/23 1109     Visit Number 8    Date for PT Re-Evaluation 04/24/23    Authorization Type [employer] Medicare    Progress Note Due on Visit 10    PT Start Time 1105    PT Stop Time 1147    PT Time Calculation (min) 42 min    Activity Tolerance Patient tolerated treatment well    Behavior During Therapy WFL for tasks assessed/performed             Past Medical History:  Diagnosis Date   Arthritis    Borderline abnormal TFTs    as a teen   CAD (coronary artery disease)    Coronary CTA 3/22: Calcium score 1 (38th percentile), minimal nonobstructive CAD with 0-24% in mid RCA; aortic atherosclerosis   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   Nephrolithiasis    Dr Logan Bores, [facility], Hx of [3][   Polyp of colon    Radiculopathy    Past Surgical History:  Procedure Laterality Date   ABDOMINAL HYSTERECTOMY     TAH/BSO --fibroids, no cancer   BALLOON DILATION N/A 07/20/2014   Procedure: BALLOON DILATION;  Surgeon: Charolett Bumpers, MD;  Location: [facility];  Service: Endoscopy;  Laterality: N/A;   BREAST BIOPSY     Right   CARPAL TUNNEL RELEASE     & trigger thumb RUE; Dr Chaney Malling   COLONOSCOPY  W/ POLYPECTOMY  07/2004   Neg 2011; Dr Danise Edge   CYSTOSCOPY  11/2009   Neg   ESOPHAGOGASTRODUODENOSCOPY N/A 07/20/2014   Procedure: ESOPHAGOGASTRODUODENOSCOPY (EGD);  Surgeon: Charolett Bumpers, MD;  Location: [facility];  Service: Endoscopy;  Laterality: N/A;   FLEXIBLE BRONCHOSCOPY W/ UPPER ENDOSCOPY     neg   KNEE ARTHROSCOPY     Left   ROTATOR CUFF REPAIR     R shoulder   TONSILLECTOMY     TRIGGER FINGER RELEASE Right    Right Thumb   Patient Active Problem List   Diagnosis Date Noted   Mitral regurgitation 11/10/2022   Shortness of breath 10/04/2022   Allergic rhinitis due to pollen 05/29/2022   Cervical spondylosis without myelopathy 05/29/2022   Hyperglycemia due to type 2 diabetes mellitus (HCC) 05/29/2022   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   Perennial allergic rhinitis 05/29/2022   Pure hypercholesterolemia 05/29/2022   Simple chronic conjunctivitis 05/29/2022   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   Partial thickness rotator cuff tear 03/17/2022   Dysuria 02/28/2022   CAD (coronary artery disease)    Myelodysplasia, 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   Spinal stenosis of lumbar region 07/30/2020   Chronic leukopenia  05/17/2020   Bilateral knee pain 05/11/2020   Atrial flutter (HCC) 01/29/2020   A-fib (HCC) 01/27/2020   Atrial flutter, paroxysmal (HCC) 01/26/2020   Lumbar spondylosis 10/27/2019   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   Iron deficiency anemia due to chronic blood loss 07/18/2019   Closed fracture of proximal phalanx of great toe 09/02/2018   Nuclear sclerosis of both eyes 08/20/2017   High risk medication use 06/15/2017   Recurrent urinary tract infection 10/14/2015   Pain in the chest    Chest pain at rest 11/26/2014   Shingles 05/14/2014   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   Vaginal atrophy 10/16/2012   Menopause 10/16/2012   Chest pain, atypical 02/19/2012   Type 2 diabetes mellitus with neurological manifestations, controlled (HCC)  02/19/2012   Migraine 01/09/2012   Interstitial cystitis (chronic) without hematuria 12/20/2011   Cervical stenosis of spinal canal 08/28/2011   Meningioma (HCC) 08/28/2011   COSTOCHONDRITIS 11/16/2010   DISTURBANCES OF SENSATION OF SMELL AND TASTE 04/05/2010   NEPHROLITHIASIS, HX OF 02/08/2010   EDEMA 10/01/2009   Brachial neuritis or radiculitis NOS 06/14/2009   EXTERNAL HEMORRHOIDS 05/25/2009   MUSCLE PAIN 05/17/2009   Hyperlipidemia 04/23/2009   ABNORMAL THYROID FUNCTION TESTS 04/23/2009   MENINGIOMA 12/26/2007   HEADACHE 12/26/2007   RADICULOPATHY 04/02/2007   Essential hypertension 11/21/2006   GERD 11/21/2006    PCP: Eliane Decree, PA REFERRING PROVIDER: Eliane Decree, PA  REFERRING DIAG: vertigo  THERAPY DIAG:  BPPV (benign paroxysmal positional vertigo), right  Cervicalgia  ONSET DATE: 4 weeks   Rationale for Evaluation and Treatment: Rehabilitation  SUBJECTIVE:   SUBJECTIVE STATEMENT: Missed visit on 04/05/23 due to severe L hip pain, went to ED, given oxycodone and toradol, had follow-up with her PCP yesterday.  Feeling much better now.   Neck is getting better looking to left out of drive way.  No spinning, just feels general unsteadiness.   Pt accompanied by: self  PERTINENT HISTORY: woke up 4 weeks ago with vertigo Sx  PMH: migraines, arthritis, neck pain, L knee OA, anemia.   PAIN:  Are you having pain? Yes: NPRS scale: 6/10 Pain location: upper traps, post c spine Pain description: continuous, chronic Aggravating factors: excessive movement particularly with looking down Relieving factors: heat, ice, meds  PRECAUTIONS: None  WEIGHT BEARING RESTRICTIONS: No  FALLS: Has patient fallen in last 6 months? No  LIVING ENVIRONMENT: Lives with: lives with their spouse Lives in: House/apartment Stairs: Yes: Internal: 11 steps; on right going up and External: 4 steps; on left going up Has following equipment at home: Single point cane  PLOF:  Independent  PATIENT GOALS: get rid of dizziness, be able to put my eye drops in without the dizziness  OBJECTIVE:   DIAGNOSTIC FINDINGS: na  COGNITION: Overall cognitive status: Within functional limits for tasks assessed   SENSATION: WFL    MUSCLE TONE: wnl   POSTURE:  increased thoracic kyphosis  Cervical ROM:    Active A/PROM (deg) eval  Flexion 25  Extension 20  Right lateral flexion   Left lateral flexion   Right rotation 40  Left rotation 40  (Blank rows = not tested)  STRENGTH: wfl Ue's   LOWER EXTREMITY MMT: WFL LE's  Sit to supine Min A  TRANSFERS: Assistive device utilized: None  Sit to stand: Complete Independence Stand to sit: Complete Independence Chair to chair: Complete Independence Floor:  na   GAIT: Gait pattern: step through pattern, decreased arm swing- Right, decreased arm swing- Left, decreased  step length- Right, decreased step length- Left, decreased stride length, Right foot flat, and Left foot flat Distance walked: in clinic, over  48' at a time Assistive device utilized: None Level of assistance: Complete Independence Comments: slowed gait speed  FUNCTIONAL TESTS:  Not performed today  PATIENT SURVEYS:  DHI 58 ,   VESTIBULAR ASSESSMENT:  GENERAL OBSERVATION: healthy and pleasant female, neck guarded with functional movements, stiff   SYMPTOM BEHAVIOR:  Subjective history: 4 week h/o o sudden onset vertigo, nausea, no head trauma, had allergies.  Non-Vestibular symptoms: neck pain, headaches, and nausea/vomiting  Type of dizziness: Imbalance (Disequilibrium) and Unsteady with head/body turns  Frequency: daily  Duration: continuous  Aggravating factors: Induced by position change: lying supine, rolling to the right, and sit to stand and Induced by motion: looking up at the ceiling, turning body quickly, and turning head quickly  Relieving factors: head stationary and closing eyes  Progression of symptoms:  better  OCULOMOTOR EXAM:  Ocular Alignment: normal  Ocular ROM: No Limitations  Spontaneous Nystagmus: absent  Gaze-Induced Nystagmus: absent  Smooth Pursuits: saccades  Saccades: hypermetric/overshoots  Convergence/Divergence: 4 cm   VESTIBULAR - OCULAR REFLEX:   Slow VOR: difficulty maintaining gaze with vertical head movements and gaze fixation.   VOR Cancellation: intact  Head-Impulse Test: NT - due to limited cervical ROM, pain and guarding.   Dynamic Visual Acuity: NT   POSITIONAL TESTING: Right Dix-Hallpike: no nystagmus Left Dix-Hallpike: no nystagmus Right Roll Test: apogeotropic nystagmus and symptoms worse on right side Left Roll Test: no nystagmus  MOTION SENSITIVITY:  Motion Sensitivity Quotient Intensity: 0 = none, 1 = Lightheaded, 2 = Mild, 3 = Moderate, 4 = Severe, 5 = Vomiting  Intensity  1. Sitting to supine 4  2. Supine to L side 1  3. Supine to R side 4  4. Supine to sitting 3  5. L Hallpike-Dix 0  6. Up from L  0  7. R Hallpike-Dix 0  8. Up from R  3  9. Sitting, head tipped to L knee   10. Head up from L knee   11. Sitting, head tipped to R knee   12. Head up from R knee   13. Sitting head turns x5   14.Sitting head nods x5   15. In stance, 180 turn to L    16. In stance, 180 turn to R     OTHOSTATICS: not done  FUNCTIONAL GAIT: 03/14/23-  DGI 18/24      VESTIBULAR TREATMENT:                                                                                                   DATE:  04/10/2023 Neuromuscular Reeducation: to improve balance and stability. SBA for safety throughout.  In corner: Eyes open feet apart x 30 sec  Eyes open feet apart with head nods x 10 Eyes open feet apart with head turns x 10 Eyes closed feet apart x 30 sec Eyes closed feet apart with head nods x 10 Eyes closed feet apart with head turns x 10  Tandem stance -  very unsteady both sides Semi tandem x 30 sec bil   Manual Therapy: to decrease muscle spasm and  pain and improve mobility In supine: STM/TPR to UT, (extremely tender today), cervical paraspinals, PA mobs cervical spine, NAGs into rotation,skilled palpation and monitoring during dry needling.  Trigger Point Dry-Needling  Treatment instructions: Expect mild to moderate muscle soreness. S/S of pneumothorax if dry needled over a lung field, and to seek immediate medical attention should they occur. Patient verbalized understanding of these instructions and education. Patient Consent Given: Yes Education handout provided: Previously provided Muscles treated: R UT Electrical stimulation performed: No Parameters: N/A Treatment response/outcome: Twitch Response Elicited and Palpable Increase in Muscle Length Modalities: MHP to neck in supine x 15 min (not included in treatment time. )     03/29/23 Therapeutic Activity:  recheck of R horizontal canal with dix hallpike.  Negative.  Manual Therapy: to decrease muscle spasm and pain and improve mobility STM/TPR to bil UT/LS and cervical parspinals, R SCM and anterior scalenes, skilled palpation and monitoring during dry needling. Trigger Point Dry-Needling  Treatment instructions: Expect mild to moderate muscle soreness. S/S of pneumothorax if dry needled over a lung field, and to seek immediate medical attention should they occur. Patient verbalized understanding of these instructions and education. Patient Consent Given: Yes Education handout provided: Previously provided Muscles treated: R UT/LS, R C3, C4 multifidi Electrical stimulation performed: No Parameters: N/A Treatment response/outcome: Twitch Response Elicited and Palpable Increase in Muscle Length   03/23/23 Therapeutic Exercise: to improve strength and mobility.  Demo, verbal and tactile cues throughout for technique. Review of HEP: chin tucks, scap squeezes, shoulder rolls, levator stretch.  Therapeutic Activity:  recheck of R horizontal canal with dix hallpike.  Negative.   Manual Therapy: to decrease muscle spasm and pain and improve mobility STM/TPR to bil UT/LS and cervical parspinals, skilled palpation and monitoring during dry needling. Trigger Point Dry-Needling  Treatment instructions: Expect mild to moderate muscle soreness. S/S of pneumothorax if dry needled over a lung field, and to seek immediate medical attention should they occur. Patient verbalized understanding of these instructions and education. Patient Consent Given: Yes Education handout provided: Previously provided Muscles treated: bil UT/LS, bil C4 multifidi Electrical stimulation performed: No Parameters: N/A Treatment response/outcome: Twitch Response Elicited and Palpable Increase in Muscle Length   03/21/23 Canalith Repositioning: Epley Right: Number of Reps: 1 and Response to Treatment: symptoms resolved Self Care: Reviewed mechanics of BPPV and sleep positioning - she sleeps on her R side which is mostly likely why it keeps reoccuring on the R.  She will try to sleep in her recliner for a few days.  Also discussed traveling on plane and importance of hydration.  Manual Therapy: to decrease muscle spasm and pain and improve mobility STM/TPR to R UT/LS and cervical parspinals, skilled palpation and monitoring during dry needling. Trigger Point Dry-Needling  Treatment instructions: Expect mild to moderate muscle soreness. S/S of pneumothorax if dry needled over a lung field, and to seek immediate medical attention should they occur. Patient verbalized understanding of these instructions and education. Patient Consent Given: Yes Education handout provided: Previously provided Muscles treated: R UT Electrical stimulation performed: No Parameters: N/A Treatment response/outcome: Twitch Response Elicited and Palpable Increase in Muscle Length   PATIENT EDUCATION: Education details: HEP review Person educated: Patient Education method: Explanation, Demonstration, and  Handouts Education comprehension: verbalized understanding and returned demonstration  HOME EXERCISE PROGRAM: Access Code: [email or web address] URL: [email or web address] Date: 03/14/2023 Prepared by: Harrie Foreman  Exercises - Seated Shoulder Rolls  - 3 x daily - 7 x weekly - 1 sets - 10 reps - Seated Cervical Retraction  - 3 x daily - 7 x weekly - 1 sets - 10 reps - Seated Scapular Retraction  - 3 x daily - 7 x weekly - 1 sets - 10 reps - Gentle Levator Scapulae Stretch  - 3 x daily - 7 x weekly - 1 sets - 3 reps - 10-15 sec hold - Seated Gaze Stabilization with Head Rotation  - 3 x daily - 7 x weekly - 3 sets - 10 reps - Seated Gaze Stabilization with Head Nod  - 3 x daily - 7 x weekly - 3 sets - 10 reps   GOALS: Goals reviewed with patient? Yes  SHORT TERM GOALS: Target date: 03/13/23  I in home program to manage vertigo Baseline: Goal status: IN PROGRESS 03/14/23- compliant   LONG TERM GOALS: Target date: 8 weeks, 04/24/23  Improve DHI to 20 Baseline: 58 Goal status: IN PROGRESS  2.  Complete DGI, score greater than19/24 for indication of reduced fall risk Baseline: NA at eval Goal status: IN PROGRESS 03/14/23 - 18/24 very limited by R knee pain.   3.  Gait speed, increase to 1 m/sec or faster to align with value concordant with efficient speed for community ambulation and safety Baseline: .49m/sec Goal status: IN PROGRESS   ASSESSMENT:  CLINICAL IMPRESSION: EBBA KLOOS reports no more episodes of vertigo/spinning but continues to have significant neck pain and general feeling of unsteadiness/dizziness.  Today started habituation exercises in corner, but was limited by neck pain.  She indicates most of the pain is now in R UT, extremely tender to palpation, improved after interventions today.   Cleotilde Neer continues to demonstrate potential for improvement and would benefit from continued skilled therapy to address impairments.      OBJECTIVE IMPAIRMENTS:  decreased activity tolerance, dizziness, hypomobility, impaired flexibility, and pain.   ACTIVITY LIMITATIONS: bending, sleeping, transfers, bed mobility, and reach over head  PARTICIPATION LIMITATIONS: meal prep, cleaning, laundry, driving, community activity, and yard work  PERSONAL FACTORS: Behavior pattern, Past/current experiences, Time since onset of injury/illness/exacerbation, and 3+ comorbidities: Dm, HTN, anemia  are also affecting patient's functional outcome.   REHAB POTENTIAL: Good  CLINICAL DECISION MAKING: Evolving/moderate complexity  EVALUATION COMPLEXITY: Low   PLAN:  PT FREQUENCY: 1-2x/week  PT DURATION: 8 weeks  PLANNED INTERVENTIONS: Therapeutic exercises, Therapeutic activity, Neuromuscular re-education, Balance training, Gait training, Patient/Family education, Self Care, Joint mobilization, Vestibular training, and Canalith repositioning  PLAN FOR NEXT SESSION: retest for BPPV if indicated, continue to progress VOR exercises/habituation exercises, manual therapy for cervicogenic component, balance training as tolerated.   Jena Gauss, PT, DPT  04/10/2023, 12:05 PM

## 2023-04-11 ENCOUNTER — Ambulatory Visit: Payer: Medicare Other | Attending: Cardiovascular Disease | Admitting: Cardiovascular Disease

## 2023-04-11 ENCOUNTER — Encounter: Payer: Self-pay | Admitting: Cardiovascular Disease

## 2023-04-11 VITALS — BP 136/70 | HR 73 | Ht 64.5 in | Wt 168.6 lb

## 2023-04-11 DIAGNOSIS — I4892 Unspecified atrial flutter: Secondary | ICD-10-CM | POA: Diagnosis not present

## 2023-04-11 DIAGNOSIS — I471 Supraventricular tachycardia, unspecified: Secondary | ICD-10-CM | POA: Diagnosis not present

## 2023-04-11 NOTE — Patient Instructions (Signed)
Medication Instructions:  Your physician recommends that you continue on your current medications as directed. Please refer to the Current Medication list given to you today.  *If you need a refill on your cardiac medications before your next appointment, please call your pharmacy*   Lab Work: None If you have labs (blood work) drawn today and your tests are completely normal, you will receive your results only by: MyChart Message (if you have MyChart) OR A paper copy in the mail If you have any lab test that is abnormal or we need to change your treatment, we will call you to review the results.   Testing/Procedures: None   Follow-Up: At [facility], you and your health needs are our priority.  As part of our continuing mission to provide you with exceptional heart care, we have created designated Provider Care Teams.  These Care Teams include your primary Cardiologist (physician) and Advanced Practice Providers (APPs -  Physician Assistants and Nurse Practitioners) who all work together to provide you with the care you need, when you need it.   Your next appointment:   1 year(s)  Provider:   Philip Nahser, MD   

## 2023-04-17 ENCOUNTER — Emergency Department (HOSPITAL_BASED_OUTPATIENT_CLINIC_OR_DEPARTMENT_OTHER): Payer: Medicare Other

## 2023-04-17 ENCOUNTER — Other Ambulatory Visit: Payer: Self-pay

## 2023-04-17 ENCOUNTER — Encounter (HOSPITAL_BASED_OUTPATIENT_CLINIC_OR_DEPARTMENT_OTHER): Payer: Self-pay

## 2023-04-17 ENCOUNTER — Ambulatory Visit: Payer: Medicare Other | Admitting: Physical Therapy

## 2023-04-17 ENCOUNTER — Emergency Department (HOSPITAL_BASED_OUTPATIENT_CLINIC_OR_DEPARTMENT_OTHER)
Admission: EM | Admit: 2023-04-17 | Discharge: 2023-04-17 | Disposition: A | Discharge status: Home / Self Care | Payer: Medicare Other | Attending: Emergency Medicine | Admitting: Emergency Medicine

## 2023-04-17 ENCOUNTER — Encounter: Payer: Self-pay | Admitting: Physical Therapy

## 2023-04-17 DIAGNOSIS — L299 Pruritus, unspecified: Secondary | ICD-10-CM

## 2023-04-17 DIAGNOSIS — E119 Type 2 diabetes mellitus without complications: Secondary | ICD-10-CM | POA: Insufficient documentation

## 2023-04-17 DIAGNOSIS — Z91040 Latex allergy status: Secondary | ICD-10-CM | POA: Insufficient documentation

## 2023-04-17 DIAGNOSIS — T7840XA Allergy, unspecified, initial encounter: Secondary | ICD-10-CM

## 2023-04-17 DIAGNOSIS — R42 Dizziness and giddiness: Secondary | ICD-10-CM

## 2023-04-17 DIAGNOSIS — H8111 Benign paroxysmal vertigo, right ear: Secondary | ICD-10-CM | POA: Diagnosis not present

## 2023-04-17 DIAGNOSIS — Z7901 Long term (current) use of anticoagulants: Secondary | ICD-10-CM | POA: Diagnosis not present

## 2023-04-17 DIAGNOSIS — M542 Cervicalgia: Secondary | ICD-10-CM

## 2023-04-17 MED ORDER — DIPHENHYDRAMINE HCL 25 MG PO CAPS
25.0000 mg | ORAL_CAPSULE | Freq: Once | ORAL | Status: AC
  Administered 2023-04-17: 25 mg via ORAL
  Filled 2023-04-17: qty 1

## 2023-04-17 NOTE — ED Provider Notes (Signed)
[facility] Provider Note   CSN: [id number] Arrival date & time: 04/17/23  2220     History  Chief Complaint  Patient presents with   Allergic Reaction    Kristen Murray is a 77 y.o. female who presents with itching after taking Augmentin at 6:30 PM this evening.  This is a new medication for her. It was prescribed today by her PCP for her cough and sinus pain. The itching started in her legs bilaterally and then spread up to her arms and hands.  She also notes some itching of the right side of her tongue and ears.  Currently the itching is only in her hands.  Denies any difficulty breathing, rash, vomiting.   She also took her verapamil and metoprolol this evening but these are the medications she has been taking for a while without any complication.  She is had a cough ongoing for the past 3 weeks.  RSV and strep negative at the PCP office earlier today.  Denies any fever, chills.   Allergic Reaction Presenting symptoms: no rash        Home Medications Prior to Admission medications   Medication Sig Start Date End Date Taking? Authorizing Provider  acetaminophen (TYLENOL) 325 MG tablet Take 325-650 mg by mouth every 6 (six) hours as needed (for pain).    [provider]  Alcohol Swabs (DROPSAFE ALCOHOL PREP) 70 % PADS Apply topically. 10/19/22   [provider]  ALPRAZolam Prudy Feeler) 0.25 MG tablet Take 0.25 mg by mouth 2 (two) times daily as needed. 04/19/21   [provider]  apixaban (ELIQUIS) 5 MG TABS tablet Take 1 tablet (5 mg total) by mouth 2 (two) times daily. 11/17/22   Nahser, Deloris Ping, MD  azelastine (ASTELIN) 0.1 % nasal spray Place into both nostrils 2 (two) times daily. Use in each nostril as directed    [provider]  b complex vitamins capsule daily.    [provider]  cetirizine (ZYRTEC) 10 MG tablet Take 1 tablet (10 mg total) by mouth daily as needed for allergies (Can  take an extra dose during flare ups.). 05/30/22   Kozlow, Alvira Philips, MD  Cholecalciferol 25 MCG (1000 UT) capsule Take 1,000 Units by mouth daily.    [provider]  clotrimazole-betamethasone (LOTRISONE) cream Apply 1 Application topically 2 (two) times daily. Use for 2 weeks as needed.  Apply to area twice weekly at bedtime for maintenance dosing. 02/27/23   Patton Salles, MD  cyclobenzaprine (FLEXERIL) 10 MG tablet Take 2.5 mg by mouth daily as needed for muscle spasms.    [provider]  dextromethorphan-guaiFENesin (MUCINEX DM) 30-600 MG 12hr tablet Take 1 tablet by mouth 2 (two) times daily. 05/30/22   Kozlow, Alvira Philips, MD  diclofenac (VOLTAREN) 0.1 % ophthalmic solution 4 (four) times daily as needed.    [provider]  EPINEPHrine 0.3 mg/0.3 mL IJ SOAJ injection Inject 0.3 mg into the muscle as needed for anaphylaxis. 10/03/21   Nehemiah Settle, FNP  fluorometholone (FML) 0.1 % ophthalmic suspension Place 1 drop into both eyes every Monday, Wednesday, and Friday.     [provider]  fluticasone (FLONASE) 50 MCG/ACT nasal spray Place 1 spray into both nostrils 2 (two) times daily. 05/30/22   Kozlow, Alvira Philips, MD  folic acid (FOLVITE) 400 MCG tablet daily.    [provider]  GAVILYTE-G 236 g solution     [provider]  glipiZIDE (GLUCOTROL XL) 10 MG 24 hr tablet Take 10 mg by mouth daily. 05/05/22   [provider]  glucose blood (FREESTYLE LITE) test strip CHECK BLOOD SUGAR ONCE DAILY AS DIRECTED.  DX:790.29 01/09/14   Pecola Lawless, MD  glucose blood test strip Inject 1 strip into the skin daily.    [provider]  ipratropium (ATROVENT) 0.06 % nasal spray Place 2 sprays into both nostrils 3 (three) times daily. 05/30/22   Kozlow, Alvira Philips, MD  Lancets (FREESTYLE) lancets Check blood sugar once daily as directed DX:790.29 (FREESTYLE FREEDOM LITE) 01/09/14   Pecola Lawless, MD  lidocaine (XYLOCAINE) 5 % ointment Apply  1 application topically 3 (three) times daily as needed. 06/06/21   Patton Salles, MD  loratadine (CLARITIN) 10 MG tablet Take 1 tablet (10 mg total) by mouth daily. 01/10/20   Jacalyn Lefevre, MD  metoprolol tartrate (LOPRESSOR) 50 MG tablet TAKE 1 AND 1/2 TABLETS TWICE DAILY 11/17/22   Nahser, Deloris Ping, MD  montelukast (SINGULAIR) 10 MG tablet TAKE 1 TABLET BY MOUTH AT  BEDTIME 11/17/22   Kozlow, Alvira Philips, MD  Multiple Vitamin (MULTIVITAMIN) tablet Take 1 tablet by mouth daily. [employer] brand    [provider]  nitroGLYCERIN (NITROSTAT) 0.4 MG SL tablet Place 1 tablet (0.4 mg total) under the tongue every 5 (five) minutes as needed for chest pain. 01/11/21   Tereso Newcomer T, PA-C  pantoprazole (PROTONIX) 40 MG tablet Take 1 tablet (40 mg total) by mouth 2 (two) times daily. 05/30/22   Kozlow, Alvira Philips, MD  rosuvastatin (CRESTOR) 5 MG tablet Take 5 mg by mouth See admin instructions. Take 5 mg by mouth at bedtime on Mondays and Thursdays..Currently on hold 11/20/19   [provider]  tretinoin (RETIN-A) 0.025 % cream Apply 1 application. topically at bedtime. 12/14/21   [provider]  trolamine salicylate (ASPERCREME) 10 % cream Apply 1 application topically as needed for muscle pain.    [provider]  valACYclovir (VALTREX) 500 MG tablet Take 1 tablet (500 mg total) by mouth 2 (two) times daily. Take twice a day for thee days as needed for an outbreak. 02/27/23   Patton Salles, MD  verapamil (CALAN-SR) 180 MG CR tablet Take 180 mg by mouth at bedtime. 01/02/17   [provider]      Allergies    Codeine, Macrodantin [nitrofurantoin macrocrystal], Other, Propoxyphene, Propoxyphene hcl, Dulaglutide, Latex, Metformin hcl, Neosporin [bacitracin-polymyxin b], Zoster vac recomb adjuvanted, Augmentin [amoxicillin-pot clavulanate], Avocado, Banana, Ciprofloxacin, Tizanidine, and Tramadol    Review of Systems   Review of Systems  Skin:  Negative for  rash.    Physical Exam Updated Vital Signs BP (!) 152/86 (BP Location: Left Arm)   Pulse 71   Temp 97.8 F (36.6 C)   Resp 18   Ht 5\' 5"  (1.651 m)   Wt 75.8 kg   LMP 07/30/1990   SpO2 98%   BMI 27.79 kg/m  Physical Exam Vitals and nursing note reviewed.  Constitutional:      Appearance: Normal appearance.  HENT:     Mouth/Throat:     Comments: No edema of the lips, tongue or posterior pharynx Cardiovascular:     Rate and Rhythm: Normal rate and regular rhythm.     Heart sounds: Normal heart sounds.  Pulmonary:     Effort: Pulmonary effort is normal.     Breath sounds: Normal breath sounds.  Comments: Patient talking in full sentences with no shortness of breath.  All lung fields clear to auscultation Skin:    Findings: No rash.  Neurological:     Mental Status: She is alert.     ED Results / Procedures / Treatments   Labs (all labs ordered are listed, but only abnormal results are displayed) Labs Reviewed - No data to display  EKG None  Radiology DG Chest 2 View  Result Date: 04/17/2023 CLINICAL DATA:  Cough for 3 weeks EXAM: CHEST - 2 VIEW COMPARISON:  10/25/2022 FINDINGS: The heart size and mediastinal contours are within normal limits. Both lungs are clear. The visualized skeletal structures are unremarkable. IMPRESSION: No active cardiopulmonary disease. Electronically Signed   By: Jasmine Pang M.D.   On: 04/17/2023 23:06    Procedures Procedures    Medications Ordered in ED Medications  diphenhydrAMINE (BENADRYL) capsule 25 mg (25 mg Oral Given 04/17/23 2318)    ED Course/ Medical Decision Making/ A&P                             Medical Decision Making Amount and/or Complexity of Data Reviewed Radiology: ordered.   77 y.o. female presents to the ED for concern of itching after taking Augmentin  Differential diagnosis includes but is not limited to anaphylaxis, adverse medication effect, pneumonia, URI.   ED Course:  Patient evaluated  and is in no acute distress and breathing and talking comfortably.  Her itching is localized to her hands.  No visible rash. Vital signs stable aside from elevated BP at 152/86.  She was given Benadryl.  Will evaluate her cough with chest x-ray. Chest x-ray shows clear lung fields.  She denies any fever or chills.  No concern for pneumonia, no indication for antibiotics at this time.  11:27 PM Upon re-evaluation, patient is resting comfortably in bed with no rash or difficulty breathing.  The patient was discharged home with instructions to call her primary care provider in the morning to discuss a plan for her sinus pressure and antibiotic, as I do not feel she needs an antibiotic at this time.  She was instructed to take Benadryl scheduled for the next 24 hours and then as needed for itching.  Understands benadryl may make her drowsy.  Return precautions given  Impression: Pruritus after taking Augmentin Cough for 3 weeks   Lab Tests: None indicated  Imaging Studies ordered: I ordered imaging studies including chest x-ray I independently visualized and interpreted imaging which showed clear lung fields with no consolidations I agree with the radiologist interpretation   Cardiac Monitoring: / EKG: Not indicated   Consultations Obtained: Not indicated   Co morbidities that complicate the patient evaluation  Diabetes  Social Determinants of Health:  Unknown            Final Clinical Impression(s) / ED Diagnoses Final diagnoses:  Allergic reaction, initial encounter  Pruritus    Rx / DC Orders ED Discharge Orders     None         Arabella Merles, Cordelia Poche 04/17/23 2328    Tegeler, Canary Brim, MD 04/17/23 [phone]

## 2023-04-17 NOTE — ED Triage Notes (Signed)
Pt took first dose of Augmentin today around 6 pm and she is now itching. It began in the legs and has now spread to hands, fingers, and forearms. Pt states she has some itching in the tongue and she called nurse advice line and told to come in. Pt has slight HA.

## 2023-04-17 NOTE — ED Provider Notes (Signed)
11:12 PM Patient seen in conjunction with [facility].  Patient started taking Augmentin today for sinus infection.  She had intense pruritus after taking this medication.  She did not really break out into a rash.  No angioedema, lightheadedness or syncope, vomiting or diarrhea.  She states that she has had penicillin before without a reaction.  She went about Benadryl prior to arrival.  She has had a cough over the past several weeks.  Chest x-ray was reassuring without signs of pneumonia.  Patient will follow-up with her PCP tomorrow morning regarding antibiotic recommendations.  We will recommend scheduled Benadryl for the next day.  BP (!) 152/86 (BP Location: Left Arm)   Pulse 71   Temp 97.8 F (36.6 C)   Resp 18   Ht 5\' 5"  (1.651 m)   Wt 75.8 kg   LMP 07/30/1990   SpO2 98%   BMI 27.79 kg/m     Renne Crigler, PA-C 04/17/23 2314    Tegeler, Canary Brim, MD 04/17/23 [phone]

## 2023-04-17 NOTE — Therapy (Signed)
OUTPATIENT PHYSICAL THERAPY VESTIBULAR TREATMENT     Patient Name: Kristen Murray MRN: [id number] DOB:24-Sep-1946, 77 y.o., female Today's Date: 04/17/2023  END OF SESSION:  PT End of Session - 04/17/23 1452     Visit Number 9    Date for PT Re-Evaluation 04/24/23    Authorization Type [employer]    Progress Note Due on Visit 10    PT Start Time 1450    PT Stop Time 1530    PT Time Calculation (min) 40 min    Activity Tolerance Patient tolerated treatment well    Behavior During Therapy WFL for tasks assessed/performed             Past Medical History:  Diagnosis Date   Arthritis    Borderline abnormal TFTs    as a teen   CAD (coronary artery disease)    Coronary CTA 3/22: Calcium score 1 (38th percentile), minimal nonobstructive CAD with 0-24% in mid RCA; aortic atherosclerosis   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   Nephrolithiasis    Dr Logan Bores, [facility], Hx of [3][   Polyp of colon    Radiculopathy    Past Surgical History:  Procedure Laterality Date   ABDOMINAL HYSTERECTOMY     TAH/BSO --fibroids, no cancer   BALLOON DILATION N/A 07/20/2014   Procedure: BALLOON DILATION;  Surgeon: Charolett Bumpers, MD;  Location: [facility];  Service: Endoscopy;  Laterality: N/A;   BREAST BIOPSY     Right   CARPAL TUNNEL RELEASE     & trigger thumb RUE; Dr Chaney Malling   COLONOSCOPY  W/ POLYPECTOMY  07/2004   Neg 2011; Dr Danise Edge   CYSTOSCOPY  11/2009   Neg   ESOPHAGOGASTRODUODENOSCOPY N/A 07/20/2014   Procedure: ESOPHAGOGASTRODUODENOSCOPY (EGD);  Surgeon: Charolett Bumpers, MD;  Location: [facility];  Service: Endoscopy;  Laterality: N/A;   FLEXIBLE BRONCHOSCOPY W/ UPPER ENDOSCOPY     neg   KNEE ARTHROSCOPY     Left   ROTATOR CUFF REPAIR     R shoulder   TONSILLECTOMY     TRIGGER FINGER RELEASE Right    Right Thumb   Patient Active Problem List   Diagnosis Date Noted   Mitral regurgitation 11/10/2022   Shortness of breath 10/04/2022   Allergic rhinitis due to pollen 05/29/2022   Cervical spondylosis without myelopathy 05/29/2022   Hyperglycemia due to type 2 diabetes mellitus (HCC) 05/29/2022   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   Perennial allergic rhinitis 05/29/2022   Pure hypercholesterolemia 05/29/2022   Simple chronic conjunctivitis 05/29/2022   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   Partial thickness rotator cuff tear 03/17/2022   Dysuria 02/28/2022   CAD (coronary artery disease)    Myelodysplasia, 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   Spinal stenosis of lumbar region 07/30/2020   Chronic leukopenia  05/17/2020   Bilateral knee pain 05/11/2020   Atrial flutter (HCC) 01/29/2020   A-fib (HCC) 01/27/2020   Atrial flutter, paroxysmal (HCC) 01/26/2020   Lumbar spondylosis 10/27/2019   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   Iron deficiency anemia due to chronic blood loss 07/18/2019   Closed fracture of proximal phalanx of great toe 09/02/2018   Nuclear sclerosis of both eyes 08/20/2017   High risk medication use 06/15/2017   Recurrent urinary tract infection 10/14/2015   Pain in the chest    Chest pain at rest 11/26/2014   Shingles 05/14/2014   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   Vaginal atrophy 10/16/2012   Menopause 10/16/2012   Chest pain, atypical 02/19/2012   Type 2 diabetes mellitus with neurological manifestations, controlled (HCC)  02/19/2012   Migraine 01/09/2012   Interstitial cystitis (chronic) without hematuria 12/20/2011   Cervical stenosis of spinal canal 08/28/2011   Meningioma (HCC) 08/28/2011   COSTOCHONDRITIS 11/16/2010   DISTURBANCES OF SENSATION OF SMELL AND TASTE 04/05/2010   NEPHROLITHIASIS, HX OF 02/08/2010   EDEMA 10/01/2009   Brachial neuritis or radiculitis NOS 06/14/2009   EXTERNAL HEMORRHOIDS 05/25/2009   MUSCLE PAIN 05/17/2009   Hyperlipidemia 04/23/2009   ABNORMAL THYROID FUNCTION TESTS 04/23/2009   MENINGIOMA 12/26/2007   HEADACHE 12/26/2007   RADICULOPATHY 04/02/2007   Essential hypertension 11/21/2006   GERD 11/21/2006    PCP: Eliane Decree, PA REFERRING PROVIDER: Eliane Decree, PA  REFERRING DIAG: vertigo  THERAPY DIAG:  BPPV (benign paroxysmal positional vertigo), right  Cervicalgia  Dizziness and giddiness  ONSET DATE: 4 weeks   Rationale for Evaluation and Treatment: Rehabilitation  SUBJECTIVE:   SUBJECTIVE STATEMENT: Neck has been better, been doing her exercises.  Still hard driving. She did have some dizziness yesterday, not the vertigo, just felt like she was leaning to the right.   Pt accompanied by: self  PERTINENT HISTORY: woke up 4 weeks ago with vertigo Sx  PMH: migraines, arthritis, neck pain, L knee OA, anemia.   PAIN:  Are you having pain? Yes: NPRS scale: 3.5/10 Pain location: upper traps, post c spine Pain description: continuous, chronic Aggravating factors: excessive movement particularly with looking down Relieving factors: heat, ice, meds  PRECAUTIONS: None  WEIGHT BEARING RESTRICTIONS: No  FALLS: Has patient fallen in last 6 months? No  LIVING ENVIRONMENT: Lives with: lives with their spouse Lives in: House/apartment Stairs: Yes: Internal: 11 steps; on right going up and External: 4 steps; on left going up Has following equipment at home: Single point cane  PLOF: Independent  PATIENT GOALS: get rid of dizziness, be able to put  my eye drops in without the dizziness  OBJECTIVE:   DIAGNOSTIC FINDINGS: na  COGNITION: Overall cognitive status: Within functional limits for tasks assessed   SENSATION: WFL    MUSCLE TONE: wnl   POSTURE:  increased thoracic kyphosis  Cervical ROM:    Active A/PROM (deg) eval  Flexion 25  Extension 20  Right lateral flexion   Left lateral flexion   Right rotation 40  Left rotation 40  (Blank rows = not tested)  STRENGTH: wfl Ue's   LOWER EXTREMITY MMT: WFL LE's  Sit to supine Min A  TRANSFERS: Assistive device utilized: None  Sit to stand: Complete Independence Stand to sit: Complete Independence Chair to chair: Complete Independence Floor:  na   GAIT: Gait pattern: step through pattern, decreased arm swing- Right, decreased arm swing- Left, decreased step length- Right, decreased step length- Left, decreased stride length, Right foot flat, and  Left foot flat Distance walked: in clinic, over  96' at a time Assistive device utilized: None Level of assistance: Complete Independence Comments: slowed gait speed  FUNCTIONAL TESTS:  Not performed today  PATIENT SURVEYS:  DHI 58 ,   VESTIBULAR ASSESSMENT:  GENERAL OBSERVATION: healthy and pleasant female, neck guarded with functional movements, stiff   SYMPTOM BEHAVIOR:  Subjective history: 4 week h/o o sudden onset vertigo, nausea, no head trauma, had allergies.  Non-Vestibular symptoms: neck pain, headaches, and nausea/vomiting  Type of dizziness: Imbalance (Disequilibrium) and Unsteady with head/body turns  Frequency: daily  Duration: continuous  Aggravating factors: Induced by position change: lying supine, rolling to the right, and sit to stand and Induced by motion: looking up at the ceiling, turning body quickly, and turning head quickly  Relieving factors: head stationary and closing eyes  Progression of symptoms: better  OCULOMOTOR EXAM:  Ocular Alignment: normal  Ocular ROM: No  Limitations  Spontaneous Nystagmus: absent  Gaze-Induced Nystagmus: absent  Smooth Pursuits: saccades  Saccades: hypermetric/overshoots  Convergence/Divergence: 4 cm   VESTIBULAR - OCULAR REFLEX:   Slow VOR: difficulty maintaining gaze with vertical head movements and gaze fixation.   VOR Cancellation: intact  Head-Impulse Test: NT - due to limited cervical ROM, pain and guarding.   Dynamic Visual Acuity: NT   POSITIONAL TESTING: Right Dix-Hallpike: no nystagmus Left Dix-Hallpike: no nystagmus Right Roll Test: apogeotropic nystagmus and symptoms worse on right side Left Roll Test: no nystagmus  MOTION SENSITIVITY:  Motion Sensitivity Quotient Intensity: 0 = none, 1 = Lightheaded, 2 = Mild, 3 = Moderate, 4 = Severe, 5 = Vomiting  Intensity  1. Sitting to supine 4  2. Supine to L side 1  3. Supine to R side 4  4. Supine to sitting 3  5. L Hallpike-Dix 0  6. Up from L  0  7. R Hallpike-Dix 0  8. Up from R  3  9. Sitting, head tipped to L knee   10. Head up from L knee   11. Sitting, head tipped to R knee   12. Head up from R knee   13. Sitting head turns x5   14.Sitting head nods x5   15. In stance, 180 turn to L    16. In stance, 180 turn to R     OTHOSTATICS: not done  FUNCTIONAL GAIT: 03/14/23-  DGI 18/24      VESTIBULAR TREATMENT:                                                                                                   DATE:  04/17/2023 Therapeutic Exercise: to improve strength and mobility.  Demo, verbal and tactile cues throughout for technique. UBE forward L1 x 4 min Review HEP Neuromuscular Reeducation: to improve balance and stability. SBA for safety throughout.  Standing on foam x 30 sec - increased sway,  Standing on foam eyes closed - x 12 sec, x 15 sec In corner: Eyes open feet apart x 30 sec  Eyes open feet apart with head nods x 10 Eyes open feet apart with head  turns x 10 Eyes closed feet apart x 30 sec Eyes closed feet apart with head  nods x 10 Eyes closed feet apart with head turns x 10  Manual Therapy: to decrease muscle spasm and pain and improve mobility STM/TPR to bil UT/LS and cervical parspinals, R SCM and anterior scalenes, skilled palpation and monitoring during dry needling. Trigger Point Dry-Needling  Treatment instructions: Expect mild to moderate muscle soreness. S/S of pneumothorax if dry needled over a lung field, and to seek immediate medical attention should they occur. Patient verbalized understanding of these instructions and education. Patient Consent Given: Yes Education handout provided: Previously provided Muscles treated: R C3-4 multifid Electrical stimulation performed: No Parameters: N/A Treatment response/outcome: Palpable Increase in Muscle Length   04/10/2023 Neuromuscular Reeducation: to improve balance and stability. SBA for safety throughout.  In corner: Eyes open feet apart x 30 sec  Eyes open feet apart with head nods x 10 Eyes open feet apart with head turns x 10 Eyes closed feet apart x 30 sec Eyes closed feet apart with head nods x 10 Eyes closed feet apart with head turns x 10  Tandem stance - very unsteady both sides Semi tandem x 30 sec bil   Manual Therapy: to decrease muscle spasm and pain and improve mobility In supine: STM/TPR to UT, (extremely tender today), cervical paraspinals, PA mobs cervical spine, NAGs into rotation,skilled palpation and monitoring during dry needling.  Trigger Point Dry-Needling  Treatment instructions: Expect mild to moderate muscle soreness. S/S of pneumothorax if dry needled over a lung field, and to seek immediate medical attention should they occur. Patient verbalized understanding of these instructions and education. Patient Consent Given: Yes Education handout provided: Previously provided Muscles treated: R UT Electrical stimulation performed: No Parameters: N/A Treatment response/outcome: Twitch Response Elicited and Palpable Increase  in Muscle Length Modalities: MHP to neck in supine x 15 min (not included in treatment time. )     03/29/23 Therapeutic Activity:  recheck of R horizontal canal with dix hallpike.  Negative.  Manual Therapy: to decrease muscle spasm and pain and improve mobility STM/TPR to bil UT/LS and cervical parspinals, R SCM and anterior scalenes, skilled palpation and monitoring during dry needling. Trigger Point Dry-Needling  Treatment instructions: Expect mild to moderate muscle soreness. S/S of pneumothorax if dry needled over a lung field, and to seek immediate medical attention should they occur. Patient verbalized understanding of these instructions and education. Patient Consent Given: Yes Education handout provided: Previously provided Muscles treated: R UT/LS, R C3, C4 multifidi Electrical stimulation performed: No Parameters: N/A Treatment response/outcome: Twitch Response Elicited and Palpable Increase in Muscle Length   03/23/23 Therapeutic Exercise: to improve strength and mobility.  Demo, verbal and tactile cues throughout for technique. Review of HEP: chin tucks, scap squeezes, shoulder rolls, levator stretch.  Therapeutic Activity:  recheck of R horizontal canal with dix hallpike.  Negative.  Manual Therapy: to decrease muscle spasm and pain and improve mobility STM/TPR to bil UT/LS and cervical parspinals, skilled palpation and monitoring during dry needling. Trigger Point Dry-Needling  Treatment instructions: Expect mild to moderate muscle soreness. S/S of pneumothorax if dry needled over a lung field, and to seek immediate medical attention should they occur. Patient verbalized understanding of these instructions and education. Patient Consent Given: Yes Education handout provided: Previously provided Muscles treated: bil UT/LS, bil C4 multifidi Electrical stimulation performed: No Parameters: N/A Treatment response/outcome: Twitch Response Elicited and Palpable Increase in  Muscle Length   03/21/23 Canalith Repositioning:  Epley Right: Number of Reps: 1 and Response to Treatment: symptoms resolved Self Care: Reviewed mechanics of BPPV and sleep positioning - she sleeps on her R side which is mostly likely why it keeps reoccuring on the R.  She will try to sleep in her recliner for a few days.  Also discussed traveling on plane and importance of hydration.  Manual Therapy: to decrease muscle spasm and pain and improve mobility STM/TPR to R UT/LS and cervical parspinals, skilled palpation and monitoring during dry needling. Trigger Point Dry-Needling  Treatment instructions: Expect mild to moderate muscle soreness. S/S of pneumothorax if dry needled over a lung field, and to seek immediate medical attention should they occur. Patient verbalized understanding of these instructions and education. Patient Consent Given: Yes Education handout provided: Previously provided Muscles treated: R UT Electrical stimulation performed: No Parameters: N/A Treatment response/outcome: Twitch Response Elicited and Palpable Increase in Muscle Length   PATIENT EDUCATION: Education details: HEP review Person educated: Patient Education method: Explanation, Demonstration, and Handouts Education comprehension: verbalized understanding and returned demonstration  HOME EXERCISE PROGRAM: Access Code: [email or web address] URL: [email or web address] Date: 04/17/2023 Prepared by: Harrie Foreman  Exercises - Seated Shoulder Rolls  - 3 x daily - 7 x weekly - 1 sets - 10 reps - Seated Cervical Retraction  - 3 x daily - 7 x weekly - 1 sets - 10 reps - Seated Scapular Retraction  - 3 x daily - 7 x weekly - 1 sets - 10 reps - Gentle Levator Scapulae Stretch  - 3 x daily - 7 x weekly - 1 sets - 3 reps - 10-15 sec hold - Seated Gaze Stabilization with Head Rotation  - 3 x daily - 7 x weekly - 3 sets - 10 reps - Seated Gaze Stabilization with Head Nod  - 3 x daily - 7 x weekly - 3  sets - 10 reps - Standing Balance in Corner  - 3 x daily - 7 x weekly - Standing Balance in Corner with Eyes Closed  - 3 x daily - 7 x weekly - 3 sets   GOALS: Goals reviewed with patient? Yes  SHORT TERM GOALS: Target date: 03/13/23  I in home program to manage vertigo Baseline: Goal status: IN PROGRESS 03/14/23- compliant   LONG TERM GOALS: Target date: 8 weeks, 04/24/23  Improve DHI to 20 Baseline: 58 Goal status: IN PROGRESS  2.  Complete DGI, score greater than 19/24 for indication of reduced fall risk Baseline: NA at eval Goal status: IN PROGRESS 03/14/23 - 18/24 very limited by R knee pain.   3.  Gait speed, increase to 1 m/sec or faster to align with value concordant with efficient speed for community ambulation and safety Baseline: .73m/sec Goal status: IN PROGRESS   ASSESSMENT:  CLINICAL IMPRESSION: Cleotilde Neer reports no further episodes of vertigo, but some generalized dizziness feeling like she was leaning to R yesterday, but had no signs of this today, and while had increased sway on foam and eyes closed on mCTSIB no sway to the right.  Reviewed corner balance exercises, given for HEP today, to place chair in front for safety.  Neck pain continues to improve, but did not tolerate TrDN well today so minimized use.  Cleotilde Neer continues to demonstrate potential for improvement and would benefit from continued skilled therapy to address impairments.      OBJECTIVE IMPAIRMENTS: decreased activity tolerance, dizziness, hypomobility, impaired flexibility, and pain.   ACTIVITY LIMITATIONS: bending, sleeping, transfers, bed mobility,  and reach over head  PARTICIPATION LIMITATIONS: meal prep, cleaning, laundry, driving, community activity, and yard work  PERSONAL FACTORS: Behavior pattern, Past/current experiences, Time since onset of injury/illness/exacerbation, and 3+ comorbidities: Dm, HTN, anemia  are also affecting patient's functional outcome.   REHAB  POTENTIAL: Good  CLINICAL DECISION MAKING: Evolving/moderate complexity  EVALUATION COMPLEXITY: Low   PLAN:  PT FREQUENCY: 1-2x/week  PT DURATION: 8 weeks  PLANNED INTERVENTIONS: Therapeutic exercises, Therapeutic activity, Neuromuscular re-education, Balance training, Gait training, Patient/Family education, Self Care, Joint mobilization, Vestibular training, and Canalith repositioning  PLAN FOR NEXT SESSION: retest for BPPV if indicated, continue to progress VOR exercises/habituation exercises, manual therapy for cervicogenic component, balance training as tolerated.   Jena Gauss, PT, DPT  04/17/2023, 5:59 PM

## 2023-04-17 NOTE — Discharge Instructions (Addendum)
Stop taking your antibiotic, Augmentin. You have been given Benadryl here to help with your itching.   Continue taking 25mg  of benadryl every 6 hours for the next 24 hours. Then you make take it as needed if itching persists.  Call your primary care doctor tomorrow morning to discuss antibiotic recommendations and plan of care.  Return to the ER if you develop a new rash, difficulty breathing, vomiting, feel dizzy, you have any other concerning symptoms.

## 2023-04-23 ENCOUNTER — Encounter: Payer: Self-pay | Admitting: Physical Therapy

## 2023-04-23 ENCOUNTER — Ambulatory Visit: Payer: Medicare Other | Admitting: Physical Therapy

## 2023-04-23 DIAGNOSIS — M542 Cervicalgia: Secondary | ICD-10-CM

## 2023-04-23 DIAGNOSIS — H8111 Benign paroxysmal vertigo, right ear: Secondary | ICD-10-CM | POA: Diagnosis not present

## 2023-04-23 DIAGNOSIS — R42 Dizziness and giddiness: Secondary | ICD-10-CM

## 2023-04-23 NOTE — Therapy (Signed)
OUTPATIENT PHYSICAL THERAPY VESTIBULAR TREATMENT Progress Note Reporting Period 02/27/2023 to 04/23/2023  See note below for Objective Data and Assessment of Progress/Goals.    Patient Name: Kristen Murray MRN: [id number] DOB:Feb 16, 1946, 77 y.o., female Today's Date: 04/23/2023  END OF SESSION:  PT End of Session - 04/23/23 1535     Visit Number 10    Date for PT Re-Evaluation 04/24/23    Authorization Type [employer]    Progress Note Due on Visit 10    PT Start Time 1535    PT Stop Time 1616    PT Time Calculation (min) 41 min    Activity Tolerance Patient tolerated treatment well    Behavior During Therapy WFL for tasks assessed/performed             Past Medical History:  Diagnosis Date   Arthritis    Borderline abnormal TFTs    as a teen   CAD (coronary artery disease)    Coronary CTA 3/22: Calcium score 1 (38th percentile), minimal nonobstructive CAD with 0-24% in mid RCA; aortic atherosclerosis   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   Nephrolithiasis    Dr Logan Bores, [facility], Hx of [3][   Polyp of colon    Radiculopathy    Past Surgical History:  Procedure Laterality Date   ABDOMINAL HYSTERECTOMY     TAH/BSO --fibroids, no cancer   BALLOON DILATION N/A 07/20/2014   Procedure: BALLOON DILATION;  Surgeon: Charolett Bumpers, MD;  Location: [facility];  Service: Endoscopy;   Laterality: N/A;   BREAST BIOPSY     Right   CARPAL TUNNEL RELEASE     & trigger thumb RUE; Dr Chaney Malling   COLONOSCOPY W/ POLYPECTOMY  07/2004   Neg 2011; Dr Danise Edge   CYSTOSCOPY  11/2009   Neg   ESOPHAGOGASTRODUODENOSCOPY N/A 07/20/2014   Procedure: ESOPHAGOGASTRODUODENOSCOPY (EGD);  Surgeon: Charolett Bumpers, MD;  Location: [facility];  Service: Endoscopy;  Laterality: N/A;   FLEXIBLE BRONCHOSCOPY W/ UPPER ENDOSCOPY     neg   KNEE ARTHROSCOPY     Left   ROTATOR CUFF REPAIR     R shoulder   TONSILLECTOMY     TRIGGER FINGER RELEASE Right    Right Thumb   Patient Active Problem List   Diagnosis Date Noted   Mitral regurgitation 11/10/2022   Shortness of breath 10/04/2022   Allergic rhinitis due to pollen 05/29/2022   Cervical spondylosis without myelopathy 05/29/2022   Hyperglycemia due to type 2 diabetes mellitus (HCC) 05/29/2022   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   Perennial allergic rhinitis 05/29/2022   Pure hypercholesterolemia 05/29/2022   Simple chronic conjunctivitis 05/29/2022   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   Partial thickness rotator cuff tear 03/17/2022   Dysuria 02/28/2022   CAD (coronary artery disease)  Myelodysplasia, 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   Spinal stenosis of lumbar region 07/30/2020   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   Bilateral knee pain 05/11/2020   Atrial flutter (HCC) 01/29/2020   A-fib (HCC) 01/27/2020   Atrial flutter, paroxysmal (HCC) 01/26/2020   Lumbar spondylosis 10/27/2019   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   Iron deficiency anemia due to chronic blood loss 07/18/2019   Closed fracture of proximal phalanx of great toe 09/02/2018   Nuclear sclerosis of both eyes 08/20/2017   High risk medication use 06/15/2017   Recurrent urinary tract infection 10/14/2015   Pain in the chest    Chest pain at rest 11/26/2014   Shingles 05/14/2014   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   Vaginal atrophy 10/16/2012   Menopause  10/16/2012   Chest pain, atypical 02/19/2012   Type 2 diabetes mellitus with neurological manifestations, 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/19/2012   Migraine 01/09/2012   Interstitial cystitis (chronic) without hematuria 12/20/2011   Cervical stenosis of spinal canal 08/28/2011   Meningioma (HCC) 08/28/2011   COSTOCHONDRITIS 11/16/2010   DISTURBANCES OF SENSATION OF SMELL AND TASTE 04/05/2010   NEPHROLITHIASIS, HX OF 02/08/2010   EDEMA 10/01/2009   Brachial neuritis or radiculitis NOS 06/14/2009   EXTERNAL HEMORRHOIDS 05/25/2009   MUSCLE PAIN 05/17/2009   Hyperlipidemia 04/23/2009   ABNORMAL THYROID FUNCTION TESTS 04/23/2009   MENINGIOMA 12/26/2007   HEADACHE 12/26/2007   RADICULOPATHY 04/02/2007   Essential hypertension 11/21/2006   GERD 11/21/2006    PCP: Eliane Decree, PA REFERRING PROVIDER: Eliane Decree, PA  REFERRING DIAG: vertigo  THERAPY DIAG:  BPPV (benign paroxysmal positional vertigo), right  Cervicalgia  Dizziness and giddiness  ONSET DATE: 4 weeks   Rationale for Evaluation and Treatment: Rehabilitation  SUBJECTIVE:   SUBJECTIVE STATEMENT: Was in ED on Tuesday after allergic reaction to antibiotic.  No dizziness today, didn't notice any yesterday at church.  Feel like she's walking better too.   Always feel like she is being pulled to the right.  Pt accompanied by: self  PERTINENT HISTORY: woke up 4 weeks ago with vertigo Sx  PMH: migraines, arthritis, neck pain, L knee OA, anemia.   PAIN:  Are you having pain? Yes: NPRS scale: 5/10 Pain location: L upper trap, post c spine Pain description: continuous, chronic Aggravating factors: excessive movement particularly with looking down Relieving factors: heat, ice, meds  PRECAUTIONS: None  WEIGHT BEARING RESTRICTIONS: No  FALLS: Has patient fallen in last 6 months? No  LIVING ENVIRONMENT: Lives with: lives with their spouse Lives in: House/apartment Stairs: Yes: Internal: 11 steps; on right going up and  External: 4 steps; on left going up Has following equipment at home: Single point cane  PLOF: Independent  PATIENT GOALS: get rid of dizziness, be able to put my eye drops in without the dizziness  OBJECTIVE:   DIAGNOSTIC FINDINGS: na  COGNITION: Overall cognitive status: Within functional limits for tasks assessed   SENSATION: WFL    MUSCLE TONE: wnl   POSTURE:  increased thoracic kyphosis  Cervical ROM:    Active A/PROM (deg) eval AROM 04/23/23  Flexion 25 25  Extension 20 30  Right lateral flexion    Left lateral flexion    Right rotation 40 45  Left rotation 40 40  (Blank rows = not tested)  STRENGTH: wfl Ue's   LOWER EXTREMITY MMT: WFL LE's  Sit to supine Min A  TRANSFERS: Assistive device utilized: None  Sit to stand: Complete Independence Stand to sit: Complete Independence Chair to chair: Complete Independence  Floor:  na   GAIT: Gait pattern: step through pattern, decreased arm swing- Right, decreased arm swing- Left, decreased step length- Right, decreased step length- Left, decreased stride length, Right foot flat, and Left foot flat Distance walked: in clinic, over  99' at a time Assistive device utilized: None Level of assistance: Complete Independence Comments: slowed gait speed  FUNCTIONAL TESTS:  Not performed today  PATIENT SURVEYS:  DHI 58 , 04/23/23 58   VESTIBULAR ASSESSMENT:  GENERAL OBSERVATION: healthy and pleasant female, neck guarded with functional movements, stiff   SYMPTOM BEHAVIOR:  Subjective history: 4 week h/o o sudden onset vertigo, nausea, no head trauma, had allergies.  Non-Vestibular symptoms: neck pain, headaches, and nausea/vomiting  Type of dizziness: Imbalance (Disequilibrium) and Unsteady with head/body turns  Frequency: daily  Duration: continuous  Aggravating factors: Induced by position change: lying supine, rolling to the right, and sit to stand and Induced by motion: looking up at the ceiling,  turning body quickly, and turning head quickly  Relieving factors: head stationary and closing eyes  Progression of symptoms: better  OCULOMOTOR EXAM:  Ocular Alignment: normal  Ocular ROM: No Limitations  Spontaneous Nystagmus: absent  Gaze-Induced Nystagmus: absent  Smooth Pursuits: saccades  Saccades: hypermetric/overshoots  Convergence/Divergence: 4 cm   VESTIBULAR - OCULAR REFLEX:   Slow VOR: difficulty maintaining gaze with vertical head movements and gaze fixation.   VOR Cancellation: intact  Head-Impulse Test: NT - due to limited cervical ROM, pain and guarding.   Dynamic Visual Acuity: NT   POSITIONAL TESTING: Right Dix-Hallpike: no nystagmus Left Dix-Hallpike: no nystagmus Right Roll Test: apogeotropic nystagmus and symptoms worse on right side Left Roll Test: no nystagmus  MOTION SENSITIVITY:  Motion Sensitivity Quotient Intensity: 0 = none, 1 = Lightheaded, 2 = Mild, 3 = Moderate, 4 = Severe, 5 = Vomiting  Intensity  1. Sitting to supine 4  2. Supine to L side 1  3. Supine to R side 4  4. Supine to sitting 3  5. L Hallpike-Dix 0  6. Up from L  0  7. R Hallpike-Dix 0  8. Up from R  3  9. Sitting, head tipped to L knee   10. Head up from L knee   11. Sitting, head tipped to R knee   12. Head up from R knee   13. Sitting head turns x5   14.Sitting head nods x5   15. In stance, 180 turn to L    16. In stance, 180 turn to R     OTHOSTATICS: not done  FUNCTIONAL GAIT: 03/14/23-  DGI 18/24  [facility] PT Assessment - 04/23/23 0001       Dynamic Gait Index   Level Surface Mild Impairment    Change in Gait Speed Mild Impairment    Gait with Horizontal Head Turns Normal    Gait with Vertical Head Turns Normal    Gait and Pivot Turn Normal    Step Over Obstacle Normal    Step Around Obstacles Normal    Steps Moderate Impairment    Total Score 20                VESTIBULAR TREATMENT:  DATE:  04/23/23 Therapeutic Activity:  to assess progress towards goals UBE x 3 min forward  DGI ROM Discussion of symptoms, review of HEP, progress, what to do if BPPV symptoms return.  Manual Therapy: to decrease muscle spasm and pain and improve mobility STM/TPR to bil UT/LS and cervical parspinals, R SCM and anterior scalenes   04/17/2023 Therapeutic Exercise: to improve strength and mobility.  Demo, verbal and tactile cues throughout for technique. UBE forward L1 x 4 min Review HEP Neuromuscular Reeducation: to improve balance and stability. SBA for safety throughout.  Standing on foam x 30 sec - increased sway,  Standing on foam eyes closed - x 12 sec, x 15 sec In corner: Eyes open feet apart x 30 sec  Eyes open feet apart with head nods x 10 Eyes open feet apart with head turns x 10 Eyes closed feet apart x 30 sec Eyes closed feet apart with head nods x 10 Eyes closed feet apart with head turns x 10  Manual Therapy: to decrease muscle spasm and pain and improve mobility STM/TPR to bil UT/LS and cervical parspinals, R SCM and anterior scalenes, skilled palpation and monitoring during dry needling. Trigger Point Dry-Needling  Treatment instructions: Expect mild to moderate muscle soreness. S/S of pneumothorax if dry needled over a lung field, and to seek immediate medical attention should they occur. Patient verbalized understanding of these instructions and education. Patient Consent Given: Yes Education handout provided: Previously provided Muscles treated: R C3-4 multifid Electrical stimulation performed: No Parameters: N/A Treatment response/outcome: Palpable Increase in Muscle Length   04/10/2023 Neuromuscular Reeducation: to improve balance and stability. SBA for safety throughout.  In corner: Eyes open feet apart x 30 sec  Eyes open feet apart with head nods x 10 Eyes open feet apart with head turns x 10 Eyes closed feet apart x 30  sec Eyes closed feet apart with head nods x 10 Eyes closed feet apart with head turns x 10  Tandem stance - very unsteady both sides Semi tandem x 30 sec bil   Manual Therapy: to decrease muscle spasm and pain and improve mobility In supine: STM/TPR to UT, (extremely tender today), cervical paraspinals, PA mobs cervical spine, NAGs into rotation,skilled palpation and monitoring during dry needling.  Trigger Point Dry-Needling  Treatment instructions: Expect mild to moderate muscle soreness. S/S of pneumothorax if dry needled over a lung field, and to seek immediate medical attention should they occur. Patient verbalized understanding of these instructions and education. Patient Consent Given: Yes Education handout provided: Previously provided Muscles treated: R UT Electrical stimulation performed: No Parameters: N/A Treatment response/outcome: Twitch Response Elicited and Palpable Increase in Muscle Length Modalities: MHP to neck in supine x 15 min (not included in treatment time. )  PATIENT EDUCATION: Education details: what to do if symptoms return Person educated: Patient Education method: Explanation Education comprehension: verbalized understanding  HOME EXERCISE PROGRAM: Access Code: [email or web address] URL: [email or web address] Date: 04/17/2023 Prepared by: Harrie Foreman  Exercises - Seated Shoulder Rolls  - 3 x daily - 7 x weekly - 1 sets - 10 reps - Seated Cervical Retraction  - 3 x daily - 7 x weekly - 1 sets - 10 reps - Seated Scapular Retraction  - 3 x daily - 7 x weekly - 1 sets - 10 reps - Gentle Levator Scapulae Stretch  - 3 x daily - 7 x weekly - 1 sets - 3 reps - 10-15 sec hold - Seated Gaze Stabilization with Head Rotation  -  3 x daily - 7 x weekly - 3 sets - 10 reps - Seated Gaze Stabilization with Head Nod  - 3 x daily - 7 x weekly - 3 sets - 10 reps - Standing Balance in Corner  - 3 x daily - 7 x weekly - Standing Balance in Corner with Eyes Closed   - 3 x daily - 7 x weekly - 3 sets   GOALS: Goals reviewed with patient? Yes  SHORT TERM GOALS: Target date: 03/13/23  I in home program to manage vertigo Baseline: Goal status: MET 03/14/23- compliant 04/23/23- met.    LONG TERM GOALS: Target date: 8 weeks, 04/24/23  Improve DHI to 20 Baseline: 58 Goal status: NOT MET 04/23/23 - 58 no change.   2.  Complete DGI, score greater than 19/24 for indication of reduced fall risk Baseline: NA at eval Goal status: MET 03/14/23 - 18/24 very limited by R knee pain.  04/23/23- 20/24  3.  Gait speed, increase to 1 m/sec or faster to align with value concordant with efficient speed for community ambulation and safety Baseline: .45m/sec Goal status: NOT MET 04/23/23- 0.75 m/s, limited by R knee pain   ASSESSMENT:  CLINICAL IMPRESSION: Cleotilde Neer reports resolution of BPPV symptoms.  However, she still feels off balance like she is being pulled to the Right, and DHI has not changed, still reports 58% impairment.  After discussion, she does report history of dizziness before the onset of vertigo symptoms, and she also has a meningioma that is being monitored.  Her gait quality has improved, not walking with AD today, but speed is limited by R knee pain, but her DGI score has improved to 20/24, lowering her fall risk and meeting LTG #2.  She does report overall improvement in neck pain and cervical ROM, suggested that she talk to pain management about formal referral to PT for neck pain to continue working on her neck.  At this time placing on 30 day hold.         OBJECTIVE IMPAIRMENTS: decreased activity tolerance, dizziness, hypomobility, impaired flexibility, and pain.   ACTIVITY LIMITATIONS: bending, sleeping, transfers, bed mobility, and reach over head  PARTICIPATION LIMITATIONS: meal prep, cleaning, laundry, driving, community activity, and yard work  PERSONAL FACTORS: Behavior pattern, Past/current experiences, Time since onset of  injury/illness/exacerbation, and 3+ comorbidities: Dm, HTN, anemia  are also affecting patient's functional outcome.   REHAB POTENTIAL: Good  CLINICAL DECISION MAKING: Evolving/moderate complexity  EVALUATION COMPLEXITY: Low   PLAN:  PT FREQUENCY: 1-2x/week  PT DURATION: 8 weeks  PLANNED INTERVENTIONS: Therapeutic exercises, Therapeutic activity, Neuromuscular re-education, Balance training, Gait training, Patient/Family education, Self Care, Joint mobilization, Vestibular training, and Canalith repositioning  PLAN FOR NEXT SESSION: 30 day hold   Jena Gauss, PT, DPT  04/23/2023, 4:28 PM

## 2023-05-15 ENCOUNTER — Inpatient Hospital Stay: Payer: Medicare Other | Attending: Hematology & Oncology

## 2023-05-15 ENCOUNTER — Encounter: Payer: Self-pay | Admitting: Family

## 2023-05-15 ENCOUNTER — Inpatient Hospital Stay: Payer: Medicare Other

## 2023-05-15 ENCOUNTER — Inpatient Hospital Stay: Payer: Medicare Other | Admitting: Family

## 2023-05-15 VITALS — BP 139/63 | HR 71 | Temp 98.1°F | Resp 17 | Wt 168.8 lb

## 2023-05-15 DIAGNOSIS — D500 Iron deficiency anemia secondary to blood loss (chronic): Secondary | ICD-10-CM

## 2023-05-15 DIAGNOSIS — D46Z Other myelodysplastic syndromes: Secondary | ICD-10-CM

## 2023-05-15 DIAGNOSIS — D4620 Refractory anemia with excess of blasts, unspecified: Secondary | ICD-10-CM | POA: Diagnosis present

## 2023-05-15 DIAGNOSIS — D72819 Decreased white blood cell count, unspecified: Secondary | ICD-10-CM

## 2023-05-15 LAB — CMP (CANCER CENTER ONLY)
ALT: 18 U/L (ref 0–44)
AST: 17 U/L (ref 15–41)
Albumin: 3.8 g/dL (ref 3.5–5.0)
Alkaline Phosphatase: 79 U/L (ref 38–126)
Anion gap: 6 (ref 5–15)
BUN: 15 mg/dL (ref 8–23)
CO2: 28 mmol/L (ref 22–32)
Calcium: 9.8 mg/dL (ref 8.9–10.3)
Chloride: 101 mmol/L (ref 98–111)
Creatinine: 0.59 mg/dL (ref 0.44–1.00)
GFR, Estimated: >60 mL/min (ref >60)
Glucose, Bld: 213 mg/dL — ABNORMAL HIGH (ref 70–99)
Potassium: 3.9 mmol/L (ref 3.5–5.1)
Sodium: 135 mmol/L (ref 135–145)
Total Bilirubin: 0.3 mg/dL (ref 0.3–1.2)
Total Protein: 7.7 g/dL (ref 6.5–8.1)

## 2023-05-15 LAB — CBC WITH DIFFERENTIAL (CANCER CENTER ONLY)
Basophils Absolute: 0 10*3/uL (ref 0.0–0.1)
Basophils Relative: 0 %
Eosinophils Absolute: 0 10*3/uL (ref 0.0–0.5)
Eosinophils Relative: 0 %
HCT: 32 % — ABNORMAL LOW (ref 36.0–46.0)
Hemoglobin: 10.2 g/dL — ABNORMAL LOW (ref 12.0–15.0)
Lymphocytes Relative: 71 %
Lymphs Abs: 1.8 10*3/uL (ref 0.7–4.0)
MCH: 27.1 pg (ref 26.0–34.0)
MCHC: 31.9 g/dL (ref 30.0–36.0)
MCV: 85.1 fL (ref 80.0–100.0)
Monocytes Absolute: 0.4 10*3/uL (ref 0.1–1.0)
Monocytes Relative: 16 %
Neutro Abs: 0.2 10*3/uL — CL (ref 1.7–7.7)
Neutrophils Relative %: 7 %
Platelet Count: 150 10*3/uL (ref 150–400)
RBC: 3.76 MIL/uL — ABNORMAL LOW (ref 3.87–5.11)
RDW: 17.2 % — ABNORMAL HIGH (ref 11.5–15.5)
WBC Count: 2.5 10*3/uL — ABNORMAL LOW (ref 4.0–10.5)
nRBC: 0 % (ref 0.0–0.2)
nRBC: 0 /100 WBC

## 2023-05-15 LAB — FERRITIN: Ferritin: 935 ng/mL — ABNORMAL HIGH (ref 11–307)

## 2023-05-15 MED ORDER — DARBEPOETIN ALFA 300 MCG/0.6ML IJ SOSY
300.0000 ug | PREFILLED_SYRINGE | Freq: Once | INTRAMUSCULAR | Status: AC
  Administered 2023-05-15: 300 ug via SUBCUTANEOUS
  Filled 2023-05-15: qty 0.6

## 2023-05-15 NOTE — Progress Notes (Signed)
Hematology and Oncology Follow Up Visit  Kristen Murray [id number] 1946-07-09 77 y.o. 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 Hgb today is 10.2, MCV 85.  She has a dry cough that comes and goes.  No issue with fever, chills, n/v, rash, dizziness, SOB, chest pain, palpitations, abdominal pain or changes in bowel or bladder habits.  She notes that the sides of her neck hurt when she goes up stairs from her garage. She is asymptomatic at this time. She plans to follow-up with her cardiologist.  No falls or syncope.   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  No swelling noted in her extremities. Pedal pulses are 2+.  Appetite and hydration are good. Weight is stable at 168 lbs.   ECOG Performance Status: 1 - Symptomatic but completely ambulatory  Medications:  Allergies as of 05/15/2023       Reactions   Codeine Hives, Other (See Comments)   Because of a history of documented adverse serious drug reaction, Medi Alert bracelet  is recommended   Macrodantin [nitrofurantoin Macrocrystal] Other (See Comments)   Numbness, aching all over   Other Shortness Of Breath, Rash, Other (See Comments)   Pine nuts & walnuts throat congestion. No documented angioedema.   Propoxyphene Hives, Swelling, Rash, Other (See Comments)   Propoxyphene Hcl Hives, Swelling, Rash   Dulaglutide Other (See Comments)   Other reaction(s): weak, ha, no appetite   Latex Rash, Other (See Comments)   Metformin Hcl Other (See Comments)   Other reaction(s): leg discomfort   Neosporin [bacitracin-polymyxin B] Other (See Comments)   Blisters   Zoster Vac Recomb Adjuvanted Rash, Other (See Comments)   Other  reaction(s): confusion   Augmentin [amoxicillin-pot Clavulanate] Itching   Avocado Rash   Banana Rash   Ciprofloxacin Nausea Only, Other (See Comments)   Nausea (Note: Patient takes this when on mission trips, however)   Tizanidine Other (See Comments)   Reaction not recalled   Tramadol Nausea Only        Medication List        Accurate as of May 15, 2023  3:01 PM. If you have any questions, ask your nurse or doctor.          acetaminophen 325 MG tablet Commonly known as: TYLENOL Take 325-650 mg by mouth every 6 (six) hours as needed (for pain).   ALPRAZolam 0.25 MG tablet Commonly known as: XANAX Take 0.25 mg by mouth 2 (two) times daily as needed.   apixaban 5 MG Tabs tablet Commonly known as: Eliquis Take 1 tablet (5 mg total) by mouth 2 (two) times daily.   azelastine 0.1 % nasal spray Commonly known as: ASTELIN Place into both nostrils 2 (two) times daily. Use in each nostril as directed   b complex vitamins capsule daily.   cetirizine 10 MG tablet Commonly known as: ZYRTEC Take 1 tablet (10 mg total) by mouth daily as needed for allergies (Can take an extra dose during flare ups.).   Cholecalciferol 25 MCG (1000 UT) capsule Take 1,000 Units by mouth daily.   clotrimazole-betamethasone cream Commonly known as: Lotrisone Apply  1 Application topically 2 (two) times daily. Use for 2 weeks as needed.  Apply to area twice weekly at bedtime for maintenance dosing.   cyclobenzaprine 10 MG tablet Commonly known as: FLEXERIL Take 2.5 mg by mouth daily as needed for muscle spasms.   dextromethorphan-guaiFENesin 30-600 MG 12hr tablet Commonly known as: MUCINEX DM Take 1 tablet by mouth 2 (two) times daily.   diclofenac 0.1 % ophthalmic solution Commonly known as: VOLTAREN 4 (four) times daily as needed.   DropSafe Alcohol Prep 70 % Pads Apply topically.   EPINEPHrine 0.3 mg/0.3 mL Soaj injection Commonly known as: EPI-PEN Inject 0.3 mg into the muscle  as needed for anaphylaxis.   fluorometholone 0.1 % ophthalmic suspension Commonly known as: FML Place 1 drop into both eyes every Monday, Wednesday, and Friday.   fluticasone 50 MCG/ACT nasal spray Commonly known as: FLONASE Place 1 spray into both nostrils 2 (two) times daily.   folic acid 400 MCG tablet Commonly known as: FOLVITE daily.   freestyle lancets Check blood sugar once daily as directed DX:790.29 (FREESTYLE FREEDOM LITE)   GaviLyte-G 236 g solution Generic drug: polyethylene glycol   glipiZIDE 10 MG 24 hr tablet Commonly known as: GLUCOTROL XL Take 10 mg by mouth daily.   glucose blood test strip Inject 1 strip into the skin daily.   glucose blood test strip Commonly known as: FREESTYLE LITE CHECK BLOOD SUGAR ONCE DAILY AS DIRECTED.  DX:790.29   ipratropium 0.06 % nasal spray Commonly known as: ATROVENT Place 2 sprays into both nostrils 3 (three) times daily.   lidocaine 5 % ointment Commonly known as: XYLOCAINE Apply 1 application topically 3 (three) times daily as needed.   loratadine 10 MG tablet Commonly known as: CLARITIN Take 1 tablet (10 mg total) by mouth daily.   metoprolol tartrate 50 MG tablet Commonly known as: LOPRESSOR TAKE 1 AND 1/2 TABLETS TWICE DAILY   montelukast 10 MG tablet Commonly known as: SINGULAIR TAKE 1 TABLET BY MOUTH AT  BEDTIME   multivitamin tablet Take 1 tablet by mouth daily. Shaklee brand   nitroGLYCERIN 0.4 MG SL tablet Commonly known as: NITROSTAT Place 1 tablet (0.4 mg total) under the tongue every 5 (five) minutes as needed for chest pain.   pantoprazole 40 MG tablet Commonly known as: PROTONIX Take 1 tablet (40 mg total) by mouth 2 (two) times daily.   rosuvastatin 5 MG tablet Commonly known as: CRESTOR Take 5 mg by mouth See admin instructions. Take 5 mg by mouth at bedtime on Mondays and Thursdays..Currently on hold   tretinoin 0.025 % cream Commonly known as: RETIN-A Apply 1 application. topically  at bedtime.   trolamine salicylate 10 % cream Commonly known as: ASPERCREME Apply 1 application topically as needed for muscle pain.   valACYclovir 500 MG tablet Commonly known as: VALTREX Take 1 tablet (500 mg total) by mouth 2 (two) times daily. Take twice a day for thee days as needed for an outbreak.   verapamil 180 MG CR tablet Commonly known as: CALAN-SR Take 180 mg by mouth at bedtime.        Allergies:  Allergies  Allergen Reactions   Codeine Hives and Other (See Comments)    Because of a history of documented adverse serious drug reaction, Medi Alert bracelet  is recommended   Macrodantin [Nitrofurantoin Macrocrystal] Other (See Comments)    Numbness, aching all over   Other Shortness Of Breath, Rash and Other (See Comments)    Pine nuts & walnuts  throat congestion. No documented angioedema.    Propoxyphene Hives, Swelling, Rash and Other (See Comments)   Propoxyphene Hcl Hives, Swelling and Rash   Dulaglutide Other (See Comments)    Other reaction(s): weak, ha, no appetite   Latex Rash and Other (See Comments)   Metformin Hcl Other (See Comments)    Other reaction(s): leg discomfort   Neosporin [Bacitracin-Polymyxin B] Other (See Comments)    Blisters   Zoster Vac Recomb Adjuvanted Rash and Other (See Comments)    Other reaction(s): confusion   Augmentin [Amoxicillin-Pot Clavulanate] Itching   Avocado Rash   Banana Rash   Ciprofloxacin Nausea Only and Other (See Comments)    Nausea (Note: Patient takes this when on mission trips, however)   Tizanidine Other (See Comments)    Reaction not recalled   Tramadol Nausea Only    Past Medical History, Surgical history, Social history, and Family History were reviewed and updated.  Review of Systems: All other 10 point review of systems is negative.   Physical Exam:  weight is 168 lb 12.8 oz (76.6 kg). Her oral temperature is 98.1 F (36.7 C). Her blood pressure is 139/63 and her pulse is 71. Her respiration  is 17 and oxygen saturation is 100%.   Wt Readings from Last 3 Encounters:  05/15/23 168 lb 12.8 oz (76.6 kg)  04/17/23 167 lb (75.8 kg)  04/11/23 168 lb 9.6 oz (76.5 kg)    Ocular: Sclerae unicteric, pupils equal, round and reactive to light Ear-nose-throat: Oropharynx clear, dentition fair Lymphatic: No cervical or supraclavicular adenopathy Lungs no rales or rhonchi, good excursion bilaterally Heart regular rate and rhythm, no murmur appreciated Abd soft, nontender, positive bowel sounds MSK no focal spinal tenderness, no joint edema Neuro: non-focal, well-oriented, appropriate affect Breasts: Deferred   Lab Results  Component Value Date   WBC 2.5 (L) 05/15/2023   HGB 10.2 (L) 05/15/2023   HCT 32.0 (L) 05/15/2023   MCV 85.1 05/15/2023   PLT 150 05/15/2023   Lab Results  Component Value Date   FERRITIN 855 (H) 04/03/2023   IRON 123 04/03/2023   TIBC 221 (L) 04/03/2023   UIBC 98 (L) 04/03/2023   IRONPCTSAT 56 (H) 04/03/2023   Lab Results  Component Value Date   RETICCTPCT 0.8 07/20/2022   RBC 3.76 (L) 05/15/2023   No results found for: "KPAFRELGTCHN", "LAMBDASER", "KAPLAMBRATIO" No results found for: "IGGSERUM", "IGA", "IGMSERUM" No results found for: "TOTALPROTELP", "ALBUMINELP", "A1GS", "A2GS", "BETS", "BETA2SER", "GAMS", "MSPIKE", "SPEI"   Chemistry      Component Value Date/Time   NA 133 (L) 04/03/2023 1335   NA 138 03/10/2020 1138   K 4.6 04/03/2023 1335   CL 99 04/03/2023 1335   CO2 26 04/03/2023 1335   BUN 14 04/03/2023 1335   BUN 10 03/10/2020 1138   CREATININE 0.63 04/03/2023 1335      Component Value Date/Time   CALCIUM 8.9 04/03/2023 1335   ALKPHOS 75 04/03/2023 1335   AST 20 04/03/2023 1335   ALT 13 04/03/2023 1335   BILITOT 0.2 (L) 04/03/2023 1335       Impression and Plan: Kristen Murray is a very pleasant 77 yo African American female with low-grade MDS, low IPSS-R score.  ESA given, Hgb 10.2.  No Neulasta needed at this time.  Iron  studies are pending.  We will follow-up in 6 weeks  Eileen Stanford, NP 7/16/20243:01 PM

## 2023-05-15 NOTE — Patient Instructions (Signed)

## 2023-05-16 LAB — IRON AND IRON BINDING CAPACITY (CC-WL,HP ONLY)
Iron: 32 ug/dL (ref 28–170)
Saturation Ratios: 15 % (ref 10.4–31.8)
TIBC: 218 ug/dL — ABNORMAL LOW (ref 250–450)
UIBC: 186 ug/dL (ref 148–442)

## 2023-05-26 DIAGNOSIS — R399 Unspecified symptoms and signs involving the genitourinary system: Secondary | ICD-10-CM | POA: Insufficient documentation

## 2023-05-31 ENCOUNTER — Other Ambulatory Visit: Payer: Self-pay | Admitting: Cardiovascular Disease

## 2023-05-31 DIAGNOSIS — I4892 Unspecified atrial flutter: Secondary | ICD-10-CM

## 2023-05-31 NOTE — Telephone Encounter (Signed)
Prescription refill request for Eliquis received. Indication: Aflutter Last office visit: 04/11/23 ([facility])  Scr: 0.59 (05/15/23)  Age: 77 Weight: 76.6kg  Appropriate dose. Refill sent.

## 2023-06-04 ENCOUNTER — Ambulatory Visit (INDEPENDENT_AMBULATORY_CARE_PROVIDER_SITE_OTHER): Payer: Medicare Other | Admitting: Podiatry

## 2023-06-04 ENCOUNTER — Other Ambulatory Visit: Payer: Self-pay | Admitting: Podiatry

## 2023-06-04 ENCOUNTER — Ambulatory Visit (INDEPENDENT_AMBULATORY_CARE_PROVIDER_SITE_OTHER): Payer: Medicare Other

## 2023-06-04 DIAGNOSIS — M79675 Pain in left toe(s): Secondary | ICD-10-CM

## 2023-06-04 DIAGNOSIS — M79674 Pain in right toe(s): Secondary | ICD-10-CM | POA: Diagnosis not present

## 2023-06-04 DIAGNOSIS — B351 Tinea unguium: Secondary | ICD-10-CM | POA: Diagnosis not present

## 2023-06-04 DIAGNOSIS — M7751 Other enthesopathy of right foot: Secondary | ICD-10-CM | POA: Diagnosis not present

## 2023-06-04 DIAGNOSIS — M7752 Other enthesopathy of left foot: Secondary | ICD-10-CM

## 2023-06-04 MED ORDER — BETAMETHASONE SOD PHOS & ACET 6 (3-3) MG/ML IJ SUSP
3.0000 mg | Freq: Once | INTRAMUSCULAR | Status: AC
Start: 2023-06-04 — End: 2023-06-04
  Administered 2023-06-04: 3 mg via INTRA_ARTICULAR

## 2023-06-04 NOTE — Progress Notes (Addendum)
Chief Complaint  Patient presents with   Nail Problem    Nail trim     SUBJECTIVE Patient with a history of diabetes mellitus presents to office today complaining of elongated, thickened nails that cause pain while ambulating in shoes.  Patient is unable to trim their own nails.   Patient also has a new complaint of bilateral ankle pain.  Patient states that about 2 weeks ago she began to experience pain and tenderness to the bilateral ankles.  She says that she has always had low-grade ankle pain but it was exacerbated about 2 weeks ago especially when getting out of bed in the morning.  There has been some slight improvement but she continues to experience some tenderness to the ankles.  Denies a history of injury.  Has not done anything for treatment.  Patient is here for further evaluation and treatment.  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   Nephrolithiasis    Dr Logan Bores, [facility], Hx of [3][   Polyp of colon    Radiculopathy     Allergies  Allergen Reactions   Codeine Hives and Other (See Comments)    Because of a history of  documented adverse serious drug reaction, Medi Alert bracelet  is recommended   Macrodantin [Nitrofurantoin Macrocrystal] Other (See Comments)    Numbness, aching all over   Other Shortness Of Breath, Rash and Other (See Comments)    Pine nuts & walnuts throat congestion. No documented angioedema.    Propoxyphene Hives, Swelling, Rash and Other (See Comments)   Propoxyphene Hcl Hives, Swelling and Rash   Dulaglutide Other (See Comments)    Other reaction(s): weak, ha, no appetite   Latex Rash and Other (See Comments)   Metformin Hcl Other (See Comments)    Other reaction(s): leg discomfort   Neosporin [Bacitracin-Polymyxin B] Other (See Comments)    Blisters   Zoster Vac Recomb Adjuvanted Rash and Other (See Comments)    Other reaction(s): confusion   Augmentin [Amoxicillin-Pot Clavulanate] Itching   Avocado Rash   Banana Rash   Ciprofloxacin Nausea Only and Other (See Comments)    Nausea (Note: Patient takes this when on mission trips, however)   Tizanidine Other (See Comments)    Reaction not recalled   Tramadol Nausea Only     OBJECTIVE General Patient is awake, alert, and oriented x 3 and in no acute distress. Derm Skin is dry and supple bilateral. Negative open lesions or macerations. Remaining integument unremarkable. Nails are tender, long, thickened and dystrophic with  subungual debris, consistent with onychomycosis, 1-5 bilateral. No signs of infection noted. Vasc  DP and PT pedal pulses palpable bilaterally. Temperature gradient within normal limits.  Neuro light touch and protective threshold sensation diminished bilaterally.  Musculoskeletal Exam range of motion WNL.  Muscle strength 5/5 all compartments.  Pain with palpation noted to the bilateral ankle joints Radiographic exam B/L ankles 06/04/2023 diffuse osteoporosis noted.  Joint spaces mostly preserved.  Tibiotalar joint congruent.  There is some slight joint space narrowing ankle mortise view left along the lateral  gutter.  No acute fractures identified.  ASSESSMENT 1. Diabetes Mellitus w/ peripheral neuropathy 2.  Pain due to onychomycosis of toenails bilateral 3.  Capsulitis/arthritis bilateral ankles  PLAN OF CARE -Patient evaluated today.  X-rays reviewed -Instructed to maintain good pedal hygiene and foot care. Stressed importance of controlling blood sugar.  -Mechanical debridement of nails 1-5 bilaterally performed using a nail nipper. Filed with dremel without incident.  -Injection of 0.5 cc Celestone Soluspan injected the bilateral ankle joints -Advised against going barefoot.  Recommend good supportive tennis shoes and sneakers -Return to clinic 3 months    Felecia Shelling, DPM [facility]  Dr. Felecia Shelling, DPM    [address] [place], [place] [address]                Office [phone]  Fax [phone]

## 2023-06-04 NOTE — Addendum Note (Signed)
Addended by: Myriam Jacobson on: 06/04/2023 11:19 AM   Modules accepted: Orders

## 2023-06-04 NOTE — Progress Notes (Signed)
T preGYNECOLOGY  VISIT    HPI: 77 y.o.   Married  [place]  female   [phone] with Patient's last menstrual period was 07/30/1990.   here for   external vaginal discomfort- irritation and some swelling. Sitting and moving is uncomfortable.  Area was swollen, red and sore.  No vaginal discharge.  No known odor.  Vaseline is helpful.   Tried A and D ointment did not give relief.   Taking Keflex for urinary tract infection.  Urology is treating.   Discomfort for 4 weeks.  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  She did not take Valtrex.   No new exposures.   Dove sensitive skin is helpful.  Has used Diflucan in the past without problem.  GYNECOLOGIC HISTORY: Patient's last menstrual period was 07/30/1990. Contraception:  PMP Menopausal hormone therapy:  n/a Last mammogram:  03/14/23 Breast Density Cat B, BI-RADS CAT 1 neg Last pap smear:   12/15/19 neg        OB History     Gravida  3   Para  3   Term  3   Preterm      AB      Living  3      SAB      IAB      Ectopic      Multiple      Live Births  3              Patient Active Problem List   Diagnosis Date Noted   Mitral regurgitation 11/10/2022   Shortness of breath 10/04/2022   Allergic rhinitis due to pollen 05/29/2022   Cervical spondylosis without myelopathy 05/29/2022   Hyperglycemia due to type 2 diabetes mellitus (HCC) 05/29/2022   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   Perennial allergic rhinitis 05/29/2022   Pure hypercholesterolemia 05/29/2022   Simple chronic conjunctivitis 05/29/2022   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   Partial thickness rotator cuff tear 03/17/2022   Dysuria 02/28/2022   CAD (coronary artery disease)    Myelodysplasia, 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   Spinal stenosis of lumbar region 07/30/2020   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   Bilateral knee pain 05/11/2020   Atrial flutter (HCC) 01/29/2020   A-fib (HCC) 01/27/2020    Atrial flutter, paroxysmal (HCC) 01/26/2020   Lumbar spondylosis 10/27/2019   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   Iron deficiency anemia due to chronic blood loss 07/18/2019   Closed fracture of proximal phalanx of great toe 09/02/2018   Nuclear sclerosis of both eyes 08/20/2017   High risk medication use 06/15/2017   Recurrent urinary tract infection 10/14/2015   Pain in the chest    Chest pain at rest 11/26/2014   Shingles 05/14/2014   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   Vaginal atrophy 10/16/2012   Menopause 10/16/2012   Chest pain, atypical 02/19/2012   Type 2 diabetes mellitus with neurological manifestations, 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/19/2012   Migraine 01/09/2012   Interstitial cystitis (chronic) without hematuria 12/20/2011   Cervical stenosis of spinal canal 08/28/2011   Meningioma (HCC) 08/28/2011   COSTOCHONDRITIS 11/16/2010   DISTURBANCES OF SENSATION OF SMELL AND TASTE 04/05/2010   NEPHROLITHIASIS, HX OF 02/08/2010   EDEMA 10/01/2009   Brachial neuritis or radiculitis NOS 06/14/2009   EXTERNAL HEMORRHOIDS 05/25/2009   MUSCLE PAIN 05/17/2009   Hyperlipidemia 04/23/2009   ABNORMAL THYROID FUNCTION TESTS 04/23/2009   MENINGIOMA 12/26/2007   HEADACHE 12/26/2007   RADICULOPATHY 04/02/2007   Essential hypertension 11/21/2006  GERD 11/21/2006    Past Medical History:  Diagnosis Date   Arthritis    Borderline abnormal TFTs    as a teen   CAD (coronary artery disease)    Coronary CTA 3/22: Calcium score 1 (38th percentile), minimal nonobstructive CAD with 0-24% in mid RCA; aortic atherosclerosis   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   Nephrolithiasis    Dr Logan Bores, [facility], Hx of [3][   Polyp of colon    Radiculopathy     Past Surgical History:  Procedure Laterality Date   ABDOMINAL HYSTERECTOMY     TAH/BSO --fibroids, no cancer   BALLOON DILATION N/A 07/20/2014   Procedure: BALLOON DILATION;  Surgeon: Charolett Bumpers, MD;  Location: [facility];  Service: Endoscopy;  Laterality: N/A;   BREAST BIOPSY     Right   CARPAL TUNNEL RELEASE     & trigger thumb RUE; Dr Chaney Malling   COLONOSCOPY W/ POLYPECTOMY  07/2004   Neg 2011; Dr Danise Edge   CYSTOSCOPY  11/2009   Neg   ESOPHAGOGASTRODUODENOSCOPY N/A 07/20/2014   Procedure: ESOPHAGOGASTRODUODENOSCOPY (EGD);  Surgeon: Charolett Bumpers, MD;  Location: [facility];  Service: Endoscopy;  Laterality: N/A;   FLEXIBLE BRONCHOSCOPY W/ UPPER ENDOSCOPY     neg   KNEE ARTHROSCOPY     Left   ROTATOR CUFF REPAIR     R shoulder   TONSILLECTOMY     TRIGGER FINGER RELEASE Right    Right Thumb    Current Outpatient Medications  Medication Sig Dispense Refill   acetaminophen (TYLENOL) 325 MG tablet Take 325-650 mg by mouth every 6 (six) hours as needed (for pain).     Alcohol Swabs (DROPSAFE ALCOHOL PREP) 70 % PADS Apply topically.     ALPRAZolam (XANAX) 0.25 MG tablet Take 0.25 mg by mouth 2 (two) times daily as needed.     azelastine (ASTELIN) 0.1 % nasal spray Place into both nostrils 2 (two) times daily. Use in each nostril as directed     b complex vitamins capsule daily.     cephALEXin (KEFLEX) 500 MG capsule Take by mouth.     cetirizine (ZYRTEC) 10 MG tablet Take 1 tablet (10 mg total) by mouth daily as needed for allergies (Can take an extra dose during flare ups.). 180 tablet 4   Cholecalciferol 25 MCG  (1000 UT) capsule Take 1,000 Units by mouth daily.     clotrimazole-betamethasone (LOTRISONE) cream Apply 1 Application topically 2 (two) times daily. Use for 2 weeks as needed.  Apply to area twice weekly at bedtime for maintenance dosing. 30 g 1   cyclobenzaprine (FLEXERIL) 10 MG tablet Take 2.5 mg by mouth daily as needed for muscle spasms.     dextromethorphan-guaiFENesin (MUCINEX DM) 30-600 MG 12hr tablet Take 1 tablet by mouth 2 (two) times  daily. 60 tablet 3   diclofenac (VOLTAREN) 0.1 % ophthalmic solution 4 (four) times daily as needed.     ELIQUIS 5 MG TABS tablet TAKE 1 TABLET BY MOUTH TWICE  DAILY 180 tablet 3   EPINEPHrine 0.3 mg/0.3 mL IJ SOAJ injection Inject 0.3 mg into the muscle as needed for anaphylaxis. 1 each 1   fluorometholone (FML) 0.1 % ophthalmic suspension Place 1 drop into both eyes every Monday, Wednesday, and Friday.      fluticasone (FLONASE) 50 MCG/ACT nasal spray Place 1 spray into both nostrils 2 (two) times daily. 48 g 4   folic acid (FOLVITE) 400 MCG tablet daily.     furosemide (LASIX) 20 MG tablet Take 20 mg by mouth daily.     GAVILYTE-G 236 g solution      glipiZIDE (GLUCOTROL XL) 10 MG 24 hr tablet Take 15 mg by mouth daily. Takes 15 mg     glucose blood (FREESTYLE LITE) test strip CHECK BLOOD SUGAR ONCE DAILY AS DIRECTED.  DX:790.29 100 each 12   glucose blood test strip Inject 1 strip into the skin daily.     ipratropium (ATROVENT) 0.06 % nasal spray Place 2 sprays into both nostrils 3 (three) times daily. 15 mL 11   Lancets (FREESTYLE) lancets Check blood sugar once daily as directed DX:790.29 (FREESTYLE FREEDOM LITE) 100 each 1   lidocaine (XYLOCAINE) 5 % ointment Apply 1 application topically 3 (three) times daily as needed. 1.25 g 0   loratadine (CLARITIN) 10 MG tablet Take 1 tablet (10 mg total) by mouth daily. 30 tablet 0   metoprolol tartrate (LOPRESSOR) 50 MG tablet TAKE 1 AND 1/2 TABLETS TWICE DAILY 270 tablet 3   montelukast (SINGULAIR) 10 MG  tablet TAKE 1 TABLET BY MOUTH AT  BEDTIME 90 tablet 1   Multiple Vitamin (MULTIVITAMIN) tablet Take 1 tablet by mouth daily. Shaklee brand     nitroGLYCERIN (NITROSTAT) 0.4 MG SL tablet Place 1 tablet (0.4 mg total) under the tongue every 5 (five) minutes as needed for chest pain. 25 tablet 3   pantoprazole (PROTONIX) 40 MG tablet Take 1 tablet (40 mg total) by mouth 2 (two) times daily. 180 tablet 4   phenazopyridine (PYRIDIUM) 100 MG tablet Take by mouth.     rosuvastatin (CRESTOR) 5 MG tablet Take 5 mg by mouth See admin instructions. Take 5 mg by mouth at bedtime on Mondays and Thursdays..Currently on hold     traMADol (ULTRAM) 50 MG tablet Take 1 tablet 4 times a day by oral route as needed.     tretinoin (RETIN-A) 0.025 % cream Apply 1 application. topically at bedtime.     trolamine salicylate (ASPERCREME) 10 % cream Apply 1 application topically as needed for muscle pain.     valACYclovir (VALTREX) 500 MG tablet Take 1 tablet (500 mg total) by mouth 2 (two) times daily. Take twice a day for thee days as needed for an outbreak. 30 tablet 1   verapamil (CALAN-SR) 180 MG CR tablet Take 180 mg by mouth at bedtime.     No current facility-administered medications for this visit.   Facility-Administered Medications Ordered in Other Visits  Medication Dose Route Frequency Provider Last Rate Last Admin   filgrastim-sndz (ZARXIO) injection 300 mcg  300 mcg Subcutaneous Once Josph Macho, MD         ALLERGIES: Codeine, Macrodantin [nitrofurantoin macrocrystal], Other, Propoxyphene, Propoxyphene hcl, Dulaglutide, Latex, Metformin hcl, Neosporin [bacitracin-polymyxin b], Zoster vac recomb adjuvanted, Augmentin [amoxicillin-pot clavulanate], Avocado,  Banana, Ciprofloxacin, Tizanidine, and Tramadol  Family History  Problem Relation Age of Onset   Alcohol abuse Mother    Other Father        unknown   Diabetes Maternal Aunt    Diabetes Maternal Uncle    Tuberculosis Maternal Uncle     Diabetes Maternal Grandmother    Coronary artery disease Neg Hx     Social History   Socioeconomic History   Marital status: Married    Spouse name: Not on file   Number of children: Not on file   Years of education: Not on file   Highest education level: Not on file  Occupational History   Occupation: Mudlogger: [employer]  Tobacco Use   Smoking status: Never   Smokeless tobacco: Never  Vaping Use   Vaping status: Never Used  Substance and Sexual Activity   Alcohol use: No    Alcohol/week: 0.0 standard drinks of alcohol   Drug use: No   Sexual activity: Not Currently    Birth control/protection: Post-menopausal    Comment: 1st intercourse- 20  partners - 1  Other Topics Concern   Not on file  Social History Narrative   Regular Exercise-no         Social Determinants of Health   Financial Resource Strain: Not on file  Food Insecurity: Not on file  Transportation Needs: Not on file  Physical Activity: Not on file  Stress: Not on file  Social Connections: Not on file  Intimate Partner Violence: Not on file    Review of Systems  All other systems reviewed and are negative.   PHYSICAL EXAMINATION:    BP 118/66 (BP Location: Left Arm, Patient Position: Sitting, Cuff Size: Normal)   Pulse (!) 53   Ht 5' 4.5" (1.638 m)   Wt 168 lb (76.2 kg)   LMP 07/30/1990   SpO2 97%   BMI 28.39 kg/m     General appearance: alert, cooperative and appears stated age    Pelvic: External genitalia:   mild erythema.               Urethra:  normal appearing urethra with no masses, tenderness or lesions              Bartholins and Skenes: normal                 Vagina: normal appearing vagina with normal color and discharge, no lesions              Cervix:  absent                Bimanual Exam:  Uterus:  absent              Adnexa: no mass, fullness, tenderness   Chaperone was present for exam:  Warren Lacy, CMA  ASSESSMENT  Vulvovaginitis.   Yeast. Keflex use.  Hx chronic vulvitis and HSV.  Biopsy proven spongiotic dermatitis.   PLAN  Wet prep:  positive yeast, negative clue cells, negative trich. Diflucan 150 mg po x 1.  May repeat in 72 hours prn. Fu prn.

## 2023-06-04 NOTE — Addendum Note (Signed)
Addended by: Felecia Shelling on: 06/04/2023 11:34 AM   Modules accepted: Orders

## 2023-06-05 ENCOUNTER — Encounter: Payer: Self-pay | Admitting: Physical Therapy

## 2023-06-05 ENCOUNTER — Ambulatory Visit: Payer: Medicare Other | Attending: Physician Assistant | Admitting: Physical Therapy

## 2023-06-05 DIAGNOSIS — R252 Cramp and spasm: Secondary | ICD-10-CM | POA: Insufficient documentation

## 2023-06-05 DIAGNOSIS — M542 Cervicalgia: Secondary | ICD-10-CM | POA: Insufficient documentation

## 2023-06-05 DIAGNOSIS — R293 Abnormal posture: Secondary | ICD-10-CM | POA: Diagnosis present

## 2023-06-05 NOTE — Therapy (Signed)
OUTPATIENT PHYSICAL THERAPY CERVICAL EVALUATION   Patient Name: Kristen Murray MRN: [id number] DOB:[birth year], 77 y.o., female Today's Date: 06/05/2023  END OF SESSION:  PT End of Session - 06/05/23 1322     Visit Number 1    Date for PT Re-Evaluation 07/17/23    Authorization Type [employer] Medicare    Progress Note Due on Visit 10    PT Start Time 1315    PT Stop Time 1400    PT Time Calculation (min) 45 min    Activity Tolerance Patient tolerated treatment well    Behavior During Therapy WFL for tasks assessed/performed             Past Medical History:  Diagnosis Date   Arthritis    Borderline abnormal TFTs    as a teen   CAD (coronary artery disease)    Coronary CTA 3/22: Calcium score 1 (38th percentile), minimal nonobstructive CAD with 0-24% in mid RCA; aortic atherosclerosis   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   Nephrolithiasis    Dr Logan Bores, [facility], Hx of [3][   Polyp of colon    Radiculopathy    Past Surgical History:  Procedure Laterality Date   ABDOMINAL HYSTERECTOMY     TAH/BSO --fibroids, no cancer   BALLOON DILATION N/A 07/20/2014   Procedure: BALLOON DILATION;  Surgeon: Charolett Bumpers, MD;  Location: [facility];  Service: Endoscopy;  Laterality: N/A;   BREAST BIOPSY     Right   CARPAL TUNNEL RELEASE     & trigger thumb RUE; Dr Chaney Malling   COLONOSCOPY W/  POLYPECTOMY  07/2004   Neg 2011; Dr Danise Edge   CYSTOSCOPY  11/2009   Neg   ESOPHAGOGASTRODUODENOSCOPY N/A 07/20/2014   Procedure: ESOPHAGOGASTRODUODENOSCOPY (EGD);  Surgeon: Charolett Bumpers, MD;  Location: [facility];  Service: Endoscopy;  Laterality: N/A;   FLEXIBLE BRONCHOSCOPY W/ UPPER ENDOSCOPY     neg   KNEE ARTHROSCOPY     Left   ROTATOR CUFF REPAIR     R shoulder   TONSILLECTOMY     TRIGGER FINGER RELEASE Right    Right Thumb   Patient Active Problem List   Diagnosis Date Noted   Mitral regurgitation 11/10/2022   Shortness of breath 10/04/2022   Allergic rhinitis due to pollen 05/29/2022   Cervical spondylosis without myelopathy 05/29/2022   Hyperglycemia due to type 2 diabetes mellitus (HCC) 05/29/2022   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   Perennial allergic rhinitis 05/29/2022   Pure hypercholesterolemia 05/29/2022   Simple chronic conjunctivitis 05/29/2022   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   Partial thickness rotator cuff tear 03/17/2022   Dysuria 02/28/2022   CAD (coronary artery disease)    Myelodysplasia, 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   Spinal stenosis of lumbar region 07/30/2020   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  Bilateral knee pain 05/11/2020   Atrial flutter (HCC) 01/29/2020   A-fib (HCC) 01/27/2020   Atrial flutter, paroxysmal (HCC) 01/26/2020   Lumbar spondylosis 10/27/2019   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   Iron deficiency anemia due to chronic blood loss 07/18/2019   Closed fracture of proximal phalanx of great toe 09/02/2018   Nuclear sclerosis of both eyes 08/20/2017   High risk medication use 06/15/2017   Recurrent urinary tract infection 10/14/2015   Pain in the chest    Chest pain at rest 11/26/2014   Shingles 05/14/2014   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   Vaginal atrophy 10/16/2012   Menopause 10/16/2012   Chest pain, atypical 02/19/2012   Type 2 diabetes mellitus with neurological manifestations, controlled (HCC)  02/19/2012   Migraine 01/09/2012   Interstitial cystitis (chronic) without hematuria 12/20/2011   Cervical stenosis of spinal canal 08/28/2011   Meningioma (HCC) 08/28/2011   COSTOCHONDRITIS 11/16/2010   DISTURBANCES OF SENSATION OF SMELL AND TASTE 04/05/2010   NEPHROLITHIASIS, HX OF 02/08/2010   EDEMA 10/01/2009   Brachial neuritis or radiculitis NOS 06/14/2009   EXTERNAL HEMORRHOIDS 05/25/2009   MUSCLE PAIN 05/17/2009   Hyperlipidemia 04/23/2009   ABNORMAL THYROID FUNCTION TESTS 04/23/2009   MENINGIOMA 12/26/2007   HEADACHE 12/26/2007   RADICULOPATHY 04/02/2007   Essential hypertension 11/21/2006   GERD 11/21/2006    PCP: Delma Officer, PA    REFERRING PROVIDER: refills  REFERRING DIAG: M54.2 (ICD-10-CM) - Cervicalgia S16.1XXA (ICD-10-CM) - Strain of muscle, fascia and tendon at neck level, initial encounter  THERAPY DIAG:  Cervicalgia - Plan: PT plan of care cert/re-cert  Cramp and spasm - Plan: PT plan of care cert/re-cert  Abnormal posture  Rationale for Evaluation and Treatment: Rehabilitation  ONSET DATE: chronic, worsened October 2023  SUBJECTIVE:                                                                                                                                                                                                         SUBJECTIVE STATEMENT: This last episode started October, I try to do what I can, with the warm socks and creams, but its winning this game.  The pain is in both upper shoulders, makes it hard to turn my neck when driving.  She has not had dizziness since last seen for PT in June 2024.  She sometimes gets headaches, woke up this morning with a headache, but doesn't have one now.  She got a cortisone shot yesterday.  Hand dominance: Right  PERTINENT HISTORY:  Migraines, arthritis, L knee OA, anemia, DM,  bil ankle pain, CAD, chronic Leukopenia, fibromyalgia, GERD, on Eliquis for Afib/AFlutter.   PAIN:  Are you having  pain? Yes: NPRS scale: 4-5/10 Pain location: sides of both neck Pain description: dull ache Aggravating factors: turning neck Relieving factors: rest  PRECAUTIONS: None  RED FLAGS: None     WEIGHT BEARING RESTRICTIONS: No  FALLS:  Has patient fallen in last 6 months? No  LIVING ENVIRONMENT: Lives with: lives with their spouse Lives in: House/apartment Stairs: Yes: Internal: 17 steps; on right going up and on left going up and External: 17 steps; on right going up and on left going up has chair lift Has following equipment at home: Single point cane, Walker - 2 wheeled, Tour manager, and Grab bars  OCCUPATION: retired  PLOF: Independent  PATIENT GOALS: get rid of or manage this neck pain as best I can  NEXT MD VISIT:  Jun 18, 2023  OBJECTIVE:   DIAGNOSTIC FINDINGS:  No recent available for review. CT cervical spine 08/04/2019 IMPRESSION: 1. No acute intracranial findings or skull fracture. 2. No acute facial bone fractures. 3. 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  PATIENT SURVEYS:  NDI 24/50= 48%   COGNITION: Overall cognitive status: Within functional limits for tasks assessed  SENSATION: WFL  POSTURE: rounded shoulders and forward head  PALPATION: Tenderness/tightness bil UT, levator scapulae, cervical paraspinals and suboccipitals.  Large lipoma Left scapula  CERVICAL ROM:   Active ROM A/PROM (deg) eval  Flexion 40  Extension 40  Right lateral flexion 30p!  Left lateral flexion 30p!  Right rotation 50p!  Left rotation 50p!   (Blank rows = not tested)  UPPER EXTREMITY ROM:   Shoulder ROM WFL, symmetric and pain free, slightly limited reaching behind back to waist, reports this is normal for her.      UPPER EXTREMITY MMT: 5/5 all myotomes except 4/5 R shoulder ER, has history of R RTC tear.   CERVICAL SPECIAL TESTS:  Spurling's test: Negative  TODAY'S TREATMENT:                                                                                                                               DATE:   06/05/2023 Manual Therapy: to decrease muscle spasm and pain and improve mobility.  STM/TPR to cervical paraspinals and bil UT.  skilled palpation and monitoring during dry needling.  Trigger Point Dry-Needling  Treatment instructions: Expect mild to moderate muscle soreness. S/S of pneumothorax if dry needled over a lung field, and to seek immediate medical attention should they occur. Patient verbalized understanding of these instructions and education. Patient Consent Given: Yes Education handout provided: Previously provided Muscles treated: R UT  Electrical stimulation performed: No Parameters: N/A Treatment response/outcome: Twitch Response Elicited and Palpable Increase in Muscle Length    PATIENT EDUCATION:  Education details: findings, POC Person educated: Patient Education method: Explanation Education comprehension: verbalized understanding  HOME  EXERCISE PROGRAM: TBD  ASSESSMENT:  CLINICAL IMPRESSION: Patient is a 77 y.o. Right hand dominant female who was seen today for physical therapy evaluation and treatment for cervicalgia.   She has been seen and treated by this PT previously.  She reports she continues to have neck pain and tightness primarily in bil UT and difficulty turning neck, especially when driving.  Strength is good, no radiculopathy.  She does have very large palpable trigger points in bil UT, and since she has had TrDN she consented to "1 needle" today in conjunction with manual therapy, with decreased tightness following in R UT.  Cleotilde Neer is a good candidate for physical therapy and would benefit from skilled therapy to decrease neck pain, improve QOL, and improve safety with driving.    OBJECTIVE IMPAIRMENTS: decreased activity tolerance, decreased mobility, decreased ROM, hypomobility, increased muscle spasms, impaired  flexibility, postural dysfunction, and pain.   ACTIVITY LIMITATIONS: bending  PARTICIPATION LIMITATIONS: meal prep, cleaning, laundry, driving, and shopping  PERSONAL FACTORS: Age, Time since onset of injury/illness/exacerbation, and 3+ comorbidities: Migraines, arthritis, L knee OA, anemia, DM, bil ankle pain, CAD, chronic Leukopenia, fibromyalgia, GERD, on Eliquis for Afib/AFlutter.   are also affecting patient's functional outcome.   REHAB POTENTIAL: Good  CLINICAL DECISION MAKING: Evolving/moderate complexity  EVALUATION COMPLEXITY: Moderate   GOALS: Goals reviewed with patient? Yes  SHORT TERM GOALS: Target date: 06/19/2023   Patient will be independent with initial HEP.  Baseline: needs Goal status: INITIAL  LONG TERM GOALS: Target date: 07/17/2023   Patient will be independent with advanced/ongoing HEP to improve outcomes and carryover.  Baseline:  Goal status: INITIAL  2.  Patient will report 75% improvement in neck pain to improve QOL.  Baseline:  Goal status: INITIAL  3.  Patient will demonstrate at least 60 deg cervical rotation for safety with driving.  Baseline: see objective Goal status: INITIAL  4.  Patient will report 16/50 or less on NDI  to demonstrate improved functional ability.  Baseline: 24/50 Goal status: INITIAL  PLAN:  PT FREQUENCY: 1-2x/week  PT DURATION: 6 weeks  PLANNED INTERVENTIONS: Therapeutic exercises, Therapeutic activity, Neuromuscular re-education, Balance training, Gait training, Patient/Family education, Self Care, Joint mobilization, Joint manipulation, Dry Needling, Electrical stimulation, Spinal manipulation, Spinal mobilization, Cryotherapy, Moist heat, Taping, Traction, Ultrasound, Manual therapy, and Re-evaluation  PLAN FOR NEXT SESSION: HEP for cervical AROM, postural strengthening, stretches, manual therapy, modalities PRN   Jena Gauss, PT, DPT  06/05/2023, 5:48 PM

## 2023-06-06 ENCOUNTER — Encounter: Payer: Self-pay | Admitting: Obstetrics and Gynecology

## 2023-06-06 ENCOUNTER — Ambulatory Visit (INDEPENDENT_AMBULATORY_CARE_PROVIDER_SITE_OTHER): Payer: Medicare Other | Admitting: Obstetrics and Gynecology

## 2023-06-06 VITALS — BP 118/66 | HR 53 | Ht 64.5 in | Wt 168.0 lb

## 2023-06-06 DIAGNOSIS — N760 Acute vaginitis: Secondary | ICD-10-CM

## 2023-06-06 LAB — WET PREP FOR TRICH, YEAST, CLUE

## 2023-06-06 MED ORDER — FLUCONAZOLE 150 MG PO TABS: 150.0000 mg | ORAL_TABLET | Freq: Once | ORAL | 0 refills | Status: AC

## 2023-06-06 NOTE — Patient Instructions (Signed)

## 2023-06-08 ENCOUNTER — Ambulatory Visit: Payer: Medicare Other

## 2023-06-08 DIAGNOSIS — M542 Cervicalgia: Secondary | ICD-10-CM

## 2023-06-08 DIAGNOSIS — R252 Cramp and spasm: Secondary | ICD-10-CM

## 2023-06-08 DIAGNOSIS — R293 Abnormal posture: Secondary | ICD-10-CM

## 2023-06-08 NOTE — Therapy (Signed)
OUTPATIENT PHYSICAL THERAPY CERVICAL TREATMENT   Patient Name: Kristen Murray MRN: [id number] DOB:[birth year], 77 y.o., female Today's Date: 06/08/2023  END OF SESSION:  PT End of Session - 06/08/23 1022     Visit Number 2    Date for PT Re-Evaluation 07/17/23    Authorization Type [employer]    Progress Note Due on Visit 10    PT Start Time 1017    PT Stop Time 1103    PT Time Calculation (min) 46 min    Activity Tolerance Patient tolerated treatment well    Behavior During Therapy WFL for tasks assessed/performed              Past Medical History:  Diagnosis Date   Arthritis    Borderline abnormal TFTs    as a teen   CAD (coronary artery disease)    Coronary CTA 3/22: Calcium score 1 (38th percentile), minimal nonobstructive CAD with 0-24% in mid RCA; aortic atherosclerosis   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   Nephrolithiasis    Dr Logan Bores, [facility], Hx of [3][   Polyp of colon    Radiculopathy    Past Surgical History:  Procedure Laterality Date   ABDOMINAL HYSTERECTOMY     TAH/BSO --fibroids, no cancer   BALLOON DILATION N/A 07/20/2014   Procedure: BALLOON DILATION;  Surgeon: Charolett Bumpers, MD;  Location: [facility];  Service: Endoscopy;  Laterality: N/A;   BREAST BIOPSY     Right   CARPAL TUNNEL RELEASE     & trigger thumb RUE; Dr Chaney Malling   COLONOSCOPY W/  POLYPECTOMY  07/2004   Neg 2011; Dr Danise Edge   CYSTOSCOPY  11/2009   Neg   ESOPHAGOGASTRODUODENOSCOPY N/A 07/20/2014   Procedure: ESOPHAGOGASTRODUODENOSCOPY (EGD);  Surgeon: Charolett Bumpers, MD;  Location: [facility];  Service: Endoscopy;  Laterality: N/A;   FLEXIBLE BRONCHOSCOPY W/ UPPER ENDOSCOPY     neg   KNEE ARTHROSCOPY     Left   ROTATOR CUFF REPAIR     R shoulder   TONSILLECTOMY     TRIGGER FINGER RELEASE Right    Right Thumb   Patient Active Problem List   Diagnosis Date Noted   Mitral regurgitation 11/10/2022   Shortness of breath 10/04/2022   Allergic rhinitis due to pollen 05/29/2022   Cervical spondylosis without myelopathy 05/29/2022   Hyperglycemia due to type 2 diabetes mellitus (HCC) 05/29/2022   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   Perennial allergic rhinitis 05/29/2022   Pure hypercholesterolemia 05/29/2022   Simple chronic conjunctivitis 05/29/2022   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   Partial thickness rotator cuff tear 03/17/2022   Dysuria 02/28/2022   CAD (coronary artery disease)    Myelodysplasia, 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   Spinal stenosis of lumbar region 07/30/2020   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  Bilateral knee pain 05/11/2020   Atrial flutter (HCC) 01/29/2020   A-fib (HCC) 01/27/2020   Atrial flutter, paroxysmal (HCC) 01/26/2020   Lumbar spondylosis 10/27/2019   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   Iron deficiency anemia due to chronic blood loss 07/18/2019   Closed fracture of proximal phalanx of great toe 09/02/2018   Nuclear sclerosis of both eyes 08/20/2017   High risk medication use 06/15/2017   Recurrent urinary tract infection 10/14/2015   Pain in the chest    Chest pain at rest 11/26/2014   Shingles 05/14/2014   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   Vaginal atrophy 10/16/2012   Menopause 10/16/2012   Chest pain, atypical 02/19/2012   Type 2 diabetes mellitus with neurological manifestations, controlled (HCC)  02/19/2012   Migraine 01/09/2012   Interstitial cystitis (chronic) without hematuria 12/20/2011   Cervical stenosis of spinal canal 08/28/2011   Meningioma (HCC) 08/28/2011   COSTOCHONDRITIS 11/16/2010   DISTURBANCES OF SENSATION OF SMELL AND TASTE 04/05/2010   NEPHROLITHIASIS, HX OF 02/08/2010   EDEMA 10/01/2009   Brachial neuritis or radiculitis NOS 06/14/2009   EXTERNAL HEMORRHOIDS 05/25/2009   MUSCLE PAIN 05/17/2009   Hyperlipidemia 04/23/2009   ABNORMAL THYROID FUNCTION TESTS 04/23/2009   MENINGIOMA 12/26/2007   HEADACHE 12/26/2007   RADICULOPATHY 04/02/2007   Essential hypertension 11/21/2006   GERD 11/21/2006    PCP: Delma Officer, PA    REFERRING PROVIDER: refills  REFERRING DIAG: M54.2 (ICD-10-CM) - Cervicalgia S16.1XXA (ICD-10-CM) - Strain of muscle, fascia and tendon at neck level, initial encounter  THERAPY DIAG:  Cervicalgia  Cramp and spasm  Abnormal posture  Rationale for Evaluation and Treatment: Rehabilitation  ONSET DATE: chronic, worsened October 2023  SUBJECTIVE:                                                                                                                                                                                                         SUBJECTIVE STATEMENT: Pt reports mild pain (pointing to bil UT). Hand dominance: Right  PERTINENT HISTORY:  Migraines, arthritis, L knee OA, anemia, DM, bil ankle pain, CAD, chronic Leukopenia, fibromyalgia, GERD, on Eliquis for Afib/AFlutter.   PAIN:  Are you having pain? Yes: NPRS scale: 3/10 Pain location: sides of both neck Pain description: dull ache Aggravating factors: turning neck Relieving factors: rest  PRECAUTIONS: None  RED FLAGS: None     WEIGHT BEARING RESTRICTIONS: No  FALLS:  Has patient fallen in last 6 months? No  LIVING ENVIRONMENT: Lives with: lives with their spouse Lives in: House/apartment Stairs: Yes: Internal: 17 steps; on  right going up and on  left going up and External: 17 steps; on right going up and on left going up has chair lift Has following equipment at home: Single point cane, Walker - 2 wheeled, Tour manager, and Grab bars  OCCUPATION: retired  PLOF: Independent  PATIENT GOALS: get rid of or manage this neck pain as best I can  NEXT MD VISIT:  Jun 18, 2023  OBJECTIVE:   DIAGNOSTIC FINDINGS:  No recent available for review. CT cervical spine 08/04/2019 IMPRESSION: 1. No acute intracranial findings or skull fracture. 2. No acute facial bone fractures. 3. 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  PATIENT SURVEYS:  NDI 24/50= 48%   COGNITION: Overall cognitive status: Within functional limits for tasks assessed  SENSATION: WFL  POSTURE: rounded shoulders and forward head  PALPATION: Tenderness/tightness bil UT, levator scapulae, cervical paraspinals and suboccipitals.  Large lipoma Left scapula  CERVICAL ROM:   Active ROM A/PROM (deg) eval  Flexion 40  Extension 40  Right lateral flexion 30p!  Left lateral flexion 30p!  Right rotation 50p!  Left rotation 50p!   (Blank rows = not tested)  UPPER EXTREMITY ROM:   Shoulder ROM WFL, symmetric and pain free, slightly limited reaching behind back to waist, reports this is normal for her.      UPPER EXTREMITY MMT: 5/5 all myotomes except 4/5 R shoulder ER, has history of R RTC tear.   CERVICAL SPECIAL TESTS:  Spurling's test: Negative  TODAY'S TREATMENT:                                                                                                                              DATE:  06/08/23 Therapeutic Exercise: to improve strength and mobility.  Demo, verbal and tactile cues throughout for technique.  UBE L1.0 2 min fwd 2 min back Seated UT and levator stretch 3x30 sec each side  Scap retraction 10x2" Cervical extension with pillowcase 10x3" SNAG cervical rotation with  pillowcase 5x3" each way  Manual Therapy: to decrease muscle spasm and pain and improve mobility.  STM in sitting to bil UT, LS, cervical paraspinals **more tight on R side in UT  06/05/2023 Manual Therapy: to decrease muscle spasm and pain and improve mobility.  STM/TPR to cervical paraspinals and bil UT.  skilled palpation and monitoring during dry needling.  Trigger Point Dry-Needling  Treatment instructions: Expect mild to moderate muscle soreness. S/S of pneumothorax if dry needled over a lung field, and to seek immediate medical attention should they occur. Patient verbalized understanding of these instructions and education. Patient Consent Given: Yes Education handout provided: Previously provided Muscles treated: R UT  Electrical stimulation performed: No Parameters: N/A Treatment response/outcome: Twitch Response Elicited and Palpable Increase in Muscle Length    PATIENT EDUCATION:  Education details: findings, POC Person educated: Patient Education method: Explanation Education comprehension: verbalized understanding  HOME EXERCISE PROGRAM: Access Code:  [email or web address] used previous HEP URL: [email or web address] Date: 06/08/2023 Prepared by: Verta Ellen  Exercises - Seated Shoulder Rolls  - 3 x daily - 7 x weekly - 1 sets - 10 reps - Seated Cervical Retraction  - 3 x daily - 7 x weekly - 1 sets - 10 reps - Seated Scapular Retraction  - 3 x daily - 7 x weekly - 1 sets - 10 reps - Gentle Levator Scapulae Stretch  - 3 x daily - 7 x weekly - 1 sets - 3 reps - 10-15 sec hold - Seated Gaze Stabilization with Head Rotation  - 3 x daily - 7 x weekly - 3 sets - 10 reps - Seated Gaze Stabilization with Head Nod  - 3 x daily - 7 x weekly - 3 sets - 10 reps - Standing Balance in Corner  - 3 x daily - 7 x weekly - Standing Balance in Corner with Eyes Closed  - 3 x daily - 7 x weekly - 3 sets - Seated Assisted Cervical Rotation with Towel  - 1 x daily - 7 x weekly - 2  sets - 10 reps - 3 sec hold - Cervical Extension AROM with Strap  - 1 x daily - 7 x weekly - 2 sets - 10 reps - 3 sec hold  ASSESSMENT:  CLINICAL IMPRESSION: Pt showed a good response to the initiation of exercises. Mainly focused on mobility of the neck. She demonstrates more tightness on the R side in the UT muscle with manual. Added SNAGs to HEP to improve neck mobility. Good response overall to interventions, pt does note improvement from DN, would benefit from more of this and exercise to improve function. Cleotilde Neer is a good candidate for physical therapy and would benefit from skilled therapy to decrease neck pain, improve QOL, and improve safety with driving.    OBJECTIVE IMPAIRMENTS: decreased activity tolerance, decreased mobility, decreased ROM, hypomobility, increased muscle spasms, impaired flexibility, postural dysfunction, and pain.   ACTIVITY LIMITATIONS: bending  PARTICIPATION LIMITATIONS: meal prep, cleaning, laundry, driving, and shopping  PERSONAL FACTORS: Age, Time since onset of injury/illness/exacerbation, and 3+ comorbidities: Migraines, arthritis, L knee OA, anemia, DM, bil ankle pain, CAD, chronic Leukopenia, fibromyalgia, GERD, on Eliquis for Afib/AFlutter.   are also affecting patient's functional outcome.   REHAB POTENTIAL: Good  CLINICAL DECISION MAKING: Evolving/moderate complexity  EVALUATION COMPLEXITY: Moderate   GOALS: Goals reviewed with patient? Yes  SHORT TERM GOALS: Target date: 06/19/2023   Patient will be independent with initial HEP.  Baseline: needs Goal status: IN PROGRESS  LONG TERM GOALS: Target date: 07/17/2023   Patient will be independent with advanced/ongoing HEP to improve outcomes and carryover.  Baseline:  Goal status: IN PROGRESS  2.  Patient will report 75% improvement in neck pain to improve QOL.  Baseline:  Goal status: IN PROGRESS  3.  Patient will demonstrate at least 60 deg cervical rotation for safety with  driving.  Baseline: see objective Goal status: IN PROGRESS  4.  Patient will report 16/50 or less on NDI  to demonstrate improved functional ability.  Baseline: 24/50 Goal status: IN PROGRESS  PLAN:  PT FREQUENCY: 1-2x/week  PT DURATION: 6 weeks  PLANNED INTERVENTIONS: Therapeutic exercises, Therapeutic activity, Neuromuscular re-education, Balance training, Gait training, Patient/Family education, Self Care, Joint mobilization, Joint manipulation, Dry Needling, Electrical stimulation, Spinal manipulation, Spinal mobilization, Cryotherapy, Moist heat, Taping, Traction, Ultrasound, Manual therapy, and Re-evaluation  PLAN FOR NEXT SESSION: HEP for cervical AROM, postural strengthening, stretches,  manual therapy, modalities PRN   Darleene Cleaver, PTA 06/08/2023, 11:18 AM

## 2023-06-11 ENCOUNTER — Ambulatory Visit: Payer: Medicare Other | Admitting: Physical Therapy

## 2023-06-11 ENCOUNTER — Encounter: Payer: Self-pay | Admitting: Physical Therapy

## 2023-06-11 DIAGNOSIS — R252 Cramp and spasm: Secondary | ICD-10-CM

## 2023-06-11 DIAGNOSIS — M542 Cervicalgia: Secondary | ICD-10-CM | POA: Diagnosis not present

## 2023-06-11 DIAGNOSIS — R293 Abnormal posture: Secondary | ICD-10-CM

## 2023-06-11 NOTE — Therapy (Signed)
OUTPATIENT PHYSICAL THERAPY CERVICAL TREATMENT   Patient Name: Kristen Murray MRN: [id number] DOB:1946-08-17, 77 y.o., female Today's Date: 06/11/2023  END OF SESSION:  PT End of Session - 06/11/23 1701     Visit Number 3    Date for PT Re-Evaluation 07/17/23    Authorization Type [employer]    Progress Note Due on Visit 10    PT Start Time 1701    PT Stop Time 1745    PT Time Calculation (min) 44 min    Activity Tolerance Patient tolerated treatment well    Behavior During Therapy WFL for tasks assessed/performed              Past Medical History:  Diagnosis Date   Arthritis    Borderline abnormal TFTs    as a teen   CAD (coronary artery disease)    Coronary CTA 3/22: Calcium score 1 (38th percentile), minimal nonobstructive CAD with 0-24% in mid RCA; aortic atherosclerosis   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   Nephrolithiasis    Dr Logan Bores, [facility], Hx of [3][   Polyp of colon    Radiculopathy    Past Surgical History:  Procedure Laterality Date   ABDOMINAL HYSTERECTOMY     TAH/BSO --fibroids, no cancer   BALLOON DILATION N/A 07/20/2014   Procedure: BALLOON DILATION;  Surgeon: Charolett Bumpers, MD;  Location: [facility];  Service: Endoscopy;  Laterality: N/A;   BREAST BIOPSY     Right   CARPAL TUNNEL RELEASE     & trigger thumb RUE; Dr Chaney Malling   COLONOSCOPY W/  POLYPECTOMY  07/2004   Neg 2011; Dr Danise Edge   CYSTOSCOPY  11/2009   Neg   ESOPHAGOGASTRODUODENOSCOPY N/A 07/20/2014   Procedure: ESOPHAGOGASTRODUODENOSCOPY (EGD);  Surgeon: Charolett Bumpers, MD;  Location: [facility];  Service: Endoscopy;  Laterality: N/A;   FLEXIBLE BRONCHOSCOPY W/ UPPER ENDOSCOPY     neg   KNEE ARTHROSCOPY     Left   ROTATOR CUFF REPAIR     R shoulder   TONSILLECTOMY     TRIGGER FINGER RELEASE Right    Right Thumb   Patient Active Problem List   Diagnosis Date Noted   Mitral regurgitation 11/10/2022   Shortness of breath 10/04/2022   Allergic rhinitis due to pollen 05/29/2022   Cervical spondylosis without myelopathy 05/29/2022   Hyperglycemia due to type 2 diabetes mellitus (HCC) 05/29/2022   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   Perennial allergic rhinitis 05/29/2022   Pure hypercholesterolemia 05/29/2022   Simple chronic conjunctivitis 05/29/2022   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   Partial thickness rotator cuff tear 03/17/2022   Dysuria 02/28/2022   CAD (coronary artery disease)    Myelodysplasia, 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   Spinal stenosis of lumbar region 07/30/2020   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  Bilateral knee pain 05/11/2020   Atrial flutter (HCC) 01/29/2020   A-fib (HCC) 01/27/2020   Atrial flutter, paroxysmal (HCC) 01/26/2020   Lumbar spondylosis 10/27/2019   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   Iron deficiency anemia due to chronic blood loss 07/18/2019   Closed fracture of proximal phalanx of great toe 09/02/2018   Nuclear sclerosis of both eyes 08/20/2017   High risk medication use 06/15/2017   Recurrent urinary tract infection 10/14/2015   Pain in the chest    Chest pain at rest 11/26/2014   Shingles 05/14/2014   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   Vaginal atrophy 10/16/2012   Menopause 10/16/2012   Chest pain, atypical 02/19/2012   Type 2 diabetes mellitus with neurological manifestations, controlled (HCC)  02/19/2012   Migraine 01/09/2012   Interstitial cystitis (chronic) without hematuria 12/20/2011   Cervical stenosis of spinal canal 08/28/2011   Meningioma (HCC) 08/28/2011   COSTOCHONDRITIS 11/16/2010   DISTURBANCES OF SENSATION OF SMELL AND TASTE 04/05/2010   NEPHROLITHIASIS, HX OF 02/08/2010   EDEMA 10/01/2009   Brachial neuritis or radiculitis NOS 06/14/2009   EXTERNAL HEMORRHOIDS 05/25/2009   MUSCLE PAIN 05/17/2009   Hyperlipidemia 04/23/2009   ABNORMAL THYROID FUNCTION TESTS 04/23/2009   MENINGIOMA 12/26/2007   HEADACHE 12/26/2007   RADICULOPATHY 04/02/2007   Essential hypertension 11/21/2006   GERD 11/21/2006    PCP: Delma Officer, PA    REFERRING PROVIDER: refills  REFERRING DIAG: M54.2 (ICD-10-CM) - Cervicalgia S16.1XXA (ICD-10-CM) - Strain of muscle, fascia and tendon at neck level, initial encounter  THERAPY DIAG:  Cervicalgia  Cramp and spasm  Abnormal posture  Rationale for Evaluation and Treatment: Rehabilitation  ONSET DATE: chronic, worsened October 2023  SUBJECTIVE:                                                                                                                                                                                                         SUBJECTIVE STATEMENT: Having muscle spasms down the R side of neck and arm.  Doing well with the new exercises.  Hand dominance: Right  PERTINENT HISTORY:  Migraines, arthritis, L knee OA, anemia, DM, bil ankle pain, CAD, chronic Leukopenia, fibromyalgia, GERD, on Eliquis for Afib/AFlutter.   PAIN:  Are you having pain? Yes: NPRS scale: 3/10 Pain location: sides of both neck Pain description: dull ache Aggravating factors: turning neck Relieving factors: rest  PRECAUTIONS: None  RED FLAGS: None     WEIGHT BEARING RESTRICTIONS: No  FALLS:  Has patient fallen in last 6 months? No  LIVING ENVIRONMENT: Lives with: lives with  their spouse Lives in: House/apartment Stairs:  Yes: Internal: 17 steps; on right going up and on left going up and External: 17 steps; on right going up and on left going up has chair lift Has following equipment at home: Single point cane, Walker - 2 wheeled, Tour manager, and Grab bars  OCCUPATION: retired  PLOF: Independent  PATIENT GOALS: get rid of or manage this neck pain as best I can  NEXT MD VISIT:  Jun 18, 2023  OBJECTIVE:   DIAGNOSTIC FINDINGS:  No recent available for review. CT cervical spine 08/04/2019 IMPRESSION: 1. No acute intracranial findings or skull fracture. 2. No acute facial bone fractures. 3. 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  PATIENT SURVEYS:  NDI 24/50= 48%   COGNITION: Overall cognitive status: Within functional limits for tasks assessed  SENSATION: WFL  POSTURE: rounded shoulders and forward head  PALPATION: Tenderness/tightness bil UT, levator scapulae, cervical paraspinals and suboccipitals.  Large lipoma Left scapula  CERVICAL ROM:   Active ROM A/PROM (deg) eval  Flexion 40  Extension 40  Right lateral flexion 30p!  Left lateral flexion 30p!  Right rotation 50p!  Left rotation 50p!   (Blank rows = not tested)  UPPER EXTREMITY ROM:   Shoulder ROM WFL, symmetric and pain free, slightly limited reaching behind back to waist, reports this is normal for her.      UPPER EXTREMITY MMT: 5/5 all myotomes except 4/5 R shoulder ER, has history of R RTC tear.   CERVICAL SPECIAL TESTS:  Spurling's test: Negative  TODAY'S TREATMENT:                                                                                                                              DATE:   06/11/23  Therapeutic Exercise: to improve strength and mobility.  Demo, verbal and tactile cues throughout for technique. UBE L1 fwd/2 min back Nerve glides - tray for median nerve, and ulnar nerve glide Levator stretch SCM stretch SCM  self STM Chin tucks Manual Therapy: to decrease muscle spasm and pain and improve mobility IASTM with s/s tools to R forearm - too tender, focused instead on STM/TPR to R forearm, joint mobs to distal radial/ulnar joint, bil UT, R SCM, R Levator, bil cervical paraspinals.      06/08/23 Therapeutic Exercise: to improve strength and mobility.  Demo, verbal and tactile cues throughout for technique.  UBE L1.0 2 min fwd 2 min back Seated UT and levator stretch 3x30 sec each side  Scap retraction 10x2" Cervical extension with pillowcase 10x3" SNAG cervical rotation with pillowcase 5x3" each way  Manual Therapy: to decrease muscle spasm and pain and improve mobility.  STM in sitting to bil UT, LS, cervical paraspinals **more tight on R side in UT  06/05/2023 Manual Therapy: to decrease muscle spasm and pain and improve mobility.  STM/TPR to cervical paraspinals and bil UT.  skilled palpation and monitoring during dry needling.  Trigger Point Dry-Needling  Treatment instructions: Expect mild to moderate muscle soreness. S/S of pneumothorax if dry needled over a lung field, and to seek immediate medical attention should they occur. Patient verbalized understanding of these instructions and education. Patient Consent Given: Yes Education handout provided: Previously provided Muscles treated: R UT  Electrical stimulation performed: No Parameters: N/A Treatment response/outcome: Twitch Response Elicited and Palpable Increase in Muscle Length    PATIENT EDUCATION:  Education details: HEP review Person educated: Patient Education method: Medical illustrator Education comprehension: verbalized understanding and returned demonstration  HOME EXERCISE PROGRAM: Access Code: [email or web address] URL: [email or web address] Date: 06/11/2023 Prepared by: Harrie Foreman  Exercises - Seated Shoulder Rolls  - 3 x daily - 7 x weekly - 1 sets - 10 reps - Seated Cervical Retraction  - 3 x  daily - 7 x weekly - 1 sets - 10 reps - Seated Scapular Retraction  - 3 x daily - 7 x weekly - 1 sets - 10 reps - Gentle Levator Scapulae Stretch  - 3 x daily - 7 x weekly - 1 sets - 3 reps - 10-15 sec hold - Seated Assisted Cervical Rotation with Towel  - 1 x daily - 7 x weekly - 2 sets - 10 reps - 3 sec hold - Cervical Extension AROM with Strap  - 1 x daily - 7 x weekly - 2 sets - 10 reps - 3 sec hold - Sternocleidomastoid Stretch  - 1 x daily - 7 x weekly - 1 sets - 3 reps - 10-15 hold - Median Nerve Flossing - Tray  - 1 x daily - 7 x weekly - 1 sets - 10 reps - 5 sec  hold - Ulnar Nerve Flossing  - 1 x daily - 7 x weekly - 3 sets - 10 reps  ASSESSMENT:  CLINICAL IMPRESSION: Kristen Murray reports good compliance with initial HEP, but having increased spasms today down R side of neck into R arm and hand.  Reviewed and progressed HEP, adding nerve glides to help stretch R UE as well, tolerated well.  Also added SCM stretch, very tender on R side.  Declined TrDN, focused on STM, noted multiple tender points throughout R forearm, reported decreased pain after interventions.  Kristen Murray continues to demonstrate potential for improvement and would benefit from continued skilled therapy to address impairments.     OBJECTIVE IMPAIRMENTS: decreased activity tolerance, decreased mobility, decreased ROM, hypomobility, increased muscle spasms, impaired flexibility, postural dysfunction, and pain.   ACTIVITY LIMITATIONS: bending  PARTICIPATION LIMITATIONS: meal prep, cleaning, laundry, driving, and shopping  PERSONAL FACTORS: Age, Time since onset of injury/illness/exacerbation, and 3+ comorbidities: Migraines, arthritis, L knee OA, anemia, DM, bil ankle pain, CAD, chronic Leukopenia, fibromyalgia, GERD, on Eliquis for Afib/AFlutter.   are also affecting patient's functional outcome.   REHAB POTENTIAL: Good  CLINICAL DECISION MAKING: Evolving/moderate complexity  EVALUATION COMPLEXITY:  Moderate   GOALS: Goals reviewed with patient? Yes  SHORT TERM GOALS: Target date: 06/19/2023   Patient will be independent with initial HEP.  Baseline: needs Goal status: MET 06/11/23  LONG TERM GOALS: Target date: 07/17/2023   Patient will be independent with advanced/ongoing HEP to improve outcomes and carryover.  Baseline:  Goal status: IN PROGRESS  2.  Patient will report 75% improvement in neck pain to improve QOL.  Baseline:  Goal status: IN PROGRESS  3.  Patient will demonstrate at least 60 deg cervical rotation for safety  with driving.  Baseline: see objective Goal status: IN PROGRESS  4.  Patient will report 16/50 or less on NDI  to demonstrate improved functional ability.  Baseline: 24/50 Goal status: IN PROGRESS  PLAN:  PT FREQUENCY: 1-2x/week  PT DURATION: 6 weeks  PLANNED INTERVENTIONS: Therapeutic exercises, Therapeutic activity, Neuromuscular re-education, Balance training, Gait training, Patient/Family education, Self Care, Joint mobilization, Joint manipulation, Dry Needling, Electrical stimulation, Spinal manipulation, Spinal mobilization, Cryotherapy, Moist heat, Taping, Traction, Ultrasound, Manual therapy, and Re-evaluation  PLAN FOR NEXT SESSION: HEP for cervical AROM, postural strengthening, stretches, manual therapy, modalities PRN   Jena Gauss, PT, DPT  06/11/2023, 5:52 PM

## 2023-06-14 ENCOUNTER — Ambulatory Visit: Payer: Medicare Other

## 2023-06-19 ENCOUNTER — Ambulatory Visit: Payer: Medicare Other

## 2023-06-19 DIAGNOSIS — R252 Cramp and spasm: Secondary | ICD-10-CM

## 2023-06-19 DIAGNOSIS — M542 Cervicalgia: Secondary | ICD-10-CM | POA: Diagnosis not present

## 2023-06-19 DIAGNOSIS — R293 Abnormal posture: Secondary | ICD-10-CM

## 2023-06-19 NOTE — Therapy (Signed)
OUTPATIENT PHYSICAL THERAPY CERVICAL TREATMENT   Patient Name: Kristen Murray MRN: [id number] DOB:[birth year], 77 y.o., female Today's Date: 06/19/2023  END OF SESSION:  PT End of Session - 06/19/23 1414     Visit Number 4    Date for PT Re-Evaluation 07/17/23    Authorization Type [employer]    Progress Note Due on Visit 10    PT Start Time 1406    PT Stop Time 1444    PT Time Calculation (min) 38 min               Past Medical History:  Diagnosis Date   Arthritis    Borderline abnormal TFTs    as a teen   CAD (coronary artery disease)    Coronary CTA 3/22: Calcium score 1 (38th percentile), minimal nonobstructive CAD with 0-24% in mid RCA; aortic atherosclerosis   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   Nephrolithiasis    Dr Logan Bores, [facility], Hx of [3][   Polyp of colon    Radiculopathy    Past Surgical History:  Procedure Laterality Date   ABDOMINAL HYSTERECTOMY     TAH/BSO --fibroids, no cancer   BALLOON DILATION N/A 07/20/2014   Procedure: BALLOON DILATION;  Surgeon: Charolett Bumpers, MD;  Location: [facility];  Service: Endoscopy;  Laterality: N/A;   BREAST BIOPSY     Right   CARPAL TUNNEL RELEASE     & trigger thumb RUE; Dr Chaney Malling   COLONOSCOPY W/ POLYPECTOMY  07/2004   Neg 2011; Dr Danise Edge   CYSTOSCOPY  11/2009   Neg   ESOPHAGOGASTRODUODENOSCOPY N/A  07/20/2014   Procedure: ESOPHAGOGASTRODUODENOSCOPY (EGD);  Surgeon: Charolett Bumpers, MD;  Location: [facility];  Service: Endoscopy;  Laterality: N/A;   FLEXIBLE BRONCHOSCOPY W/ UPPER ENDOSCOPY     neg   KNEE ARTHROSCOPY     Left   ROTATOR CUFF REPAIR     R shoulder   TONSILLECTOMY     TRIGGER FINGER RELEASE Right    Right Thumb   Patient Active Problem List   Diagnosis Date Noted   Mitral regurgitation 11/10/2022   Shortness of breath 10/04/2022   Allergic rhinitis due to pollen 05/29/2022   Cervical spondylosis without myelopathy 05/29/2022   Hyperglycemia due to type 2 diabetes mellitus (HCC) 05/29/2022   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   Perennial allergic rhinitis 05/29/2022   Pure hypercholesterolemia 05/29/2022   Simple chronic conjunctivitis 05/29/2022   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   Partial thickness rotator cuff tear 03/17/2022   Dysuria 02/28/2022   CAD (coronary artery disease)    Myelodysplasia, 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   Spinal stenosis of lumbar region 07/30/2020   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   Bilateral knee pain 05/11/2020   Atrial flutter (HCC) 01/29/2020   A-fib (HCC) 01/27/2020  Atrial flutter, paroxysmal (HCC) 01/26/2020   Lumbar spondylosis 10/27/2019   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   Iron deficiency anemia due to chronic blood loss 07/18/2019   Closed fracture of proximal phalanx of great toe 09/02/2018   Nuclear sclerosis of both eyes 08/20/2017   High risk medication use 06/15/2017   Recurrent urinary tract infection 10/14/2015   Pain in the chest    Chest pain at rest 11/26/2014   Shingles 05/14/2014   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   Vaginal atrophy 10/16/2012   Menopause 10/16/2012   Chest pain, atypical 02/19/2012   Type 2 diabetes mellitus with neurological manifestations, 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/19/2012   Migraine 01/09/2012   Interstitial cystitis (chronic) without hematuria 12/20/2011   Cervical stenosis of  spinal canal 08/28/2011   Meningioma (HCC) 08/28/2011   COSTOCHONDRITIS 11/16/2010   DISTURBANCES OF SENSATION OF SMELL AND TASTE 04/05/2010   NEPHROLITHIASIS, HX OF 02/08/2010   EDEMA 10/01/2009   Brachial neuritis or radiculitis NOS 06/14/2009   EXTERNAL HEMORRHOIDS 05/25/2009   MUSCLE PAIN 05/17/2009   Hyperlipidemia 04/23/2009   ABNORMAL THYROID FUNCTION TESTS 04/23/2009   MENINGIOMA 12/26/2007   HEADACHE 12/26/2007   RADICULOPATHY 04/02/2007   Essential hypertension 11/21/2006   GERD 11/21/2006    PCP: Delma Officer, PA    REFERRING PROVIDER: refills  REFERRING DIAG: M54.2 (ICD-10-CM) - Cervicalgia S16.1XXA (ICD-10-CM) - Strain of muscle, fascia and tendon at neck level, initial encounter  THERAPY DIAG:  Cervicalgia  Cramp and spasm  Abnormal posture  Rationale for Evaluation and Treatment: Rehabilitation  ONSET DATE: chronic, worsened October 2023  SUBJECTIVE:                                                                                                                                                                                                         SUBJECTIVE STATEMENT: Having muscle spasms down the R side of neck and arm.  Doing well with the new exercises.  Hand dominance: Right  PERTINENT HISTORY:  Migraines, arthritis, L knee OA, anemia, DM, bil ankle pain, CAD, chronic Leukopenia, fibromyalgia, GERD, on Eliquis for Afib/AFlutter.   PAIN:  Are you having pain? Yes: NPRS scale: 3/10 Pain location: sides of both neck Pain description: dull ache Aggravating factors: turning neck Relieving factors: rest  PRECAUTIONS: None  RED FLAGS: None     WEIGHT BEARING RESTRICTIONS: No  FALLS:  Has patient fallen in last 6 months? No  LIVING ENVIRONMENT: Lives with: lives with their spouse Lives in: House/apartment Stairs: Yes: Internal: 17 steps; on right going up and on left  going up and External: 17 steps; on right going up and on left going  up has chair lift Has following equipment at home: Single point cane, Walker - 2 wheeled, Tour manager, and Grab bars  OCCUPATION: retired  PLOF: Independent  PATIENT GOALS: get rid of or manage this neck pain as best I can  NEXT MD VISIT:  Jun 18, 2023  OBJECTIVE:   DIAGNOSTIC FINDINGS:  No recent available for review. CT cervical spine 08/04/2019 IMPRESSION: 1. No acute intracranial findings or skull fracture. 2. No acute facial bone fractures. 3. 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  PATIENT SURVEYS:  NDI 24/50= 48%   COGNITION: Overall cognitive status: Within functional limits for tasks assessed  SENSATION: WFL  POSTURE: rounded shoulders and forward head  PALPATION: Tenderness/tightness bil UT, levator scapulae, cervical paraspinals and suboccipitals.  Large lipoma Left scapula  CERVICAL ROM:   Active ROM A/PROM (deg) eval  Flexion 40  Extension 40  Right lateral flexion 30p!  Left lateral flexion 30p!  Right rotation 50p!  Left rotation 50p!   (Blank rows = not tested)  UPPER EXTREMITY ROM:   Shoulder ROM WFL, symmetric and pain free, slightly limited reaching behind back to waist, reports this is normal for her.      UPPER EXTREMITY MMT: 5/5 all myotomes except 4/5 R shoulder ER, has history of R RTC tear.   CERVICAL SPECIAL TESTS:  Spurling's test: Negative  TODAY'S TREATMENT:                                                                                                                              DATE:  06/19/23:  Manual: supine for alternating bouts of manual cervical distraction, PA and lateral glides C 2 to C 5 region Upper cervical rotation with contract / relax to improve ROM Contract /relax combined with manual stretching for each side for levator, upper traps, and post upper cervical occipital musculature.  Therex.  Instructed/ reviewed snags for 1st rib  depression with rotation to ipselateral side, utilizing belt.   06/11/23  Therapeutic Exercise: to improve strength and mobility.  Demo, verbal and tactile cues throughout for technique. UBE L1 fwd/2 min back Nerve glides - tray for median nerve, and ulnar nerve glide Levator stretch SCM stretch SCM self STM Chin tucks Manual Therapy: to decrease muscle spasm and pain and improve mobility IASTM with s/s tools to R forearm - too tender, focused instead on STM/TPR to R forearm, joint mobs to distal radial/ulnar joint, bil UT, R SCM, R Levator, bil cervical paraspinals.       06/08/23 Therapeutic Exercise: to improve strength and mobility.  Demo, verbal and tactile cues throughout for technique.  UBE L1.0 2 min fwd 2 min back Seated UT and levator stretch 3x30 sec each side  Scap retraction 10x2" Cervical extension with pillowcase 10x3" SNAG cervical rotation with pillowcase 5x3" each  way  Manual Therapy: to decrease muscle spasm and pain and improve mobility.  STM in sitting to bil UT, LS, cervical paraspinals **more tight on R side in UT  06/05/2023 Manual Therapy: to decrease muscle spasm and pain and improve mobility.  STM/TPR to cervical paraspinals and bil UT.  skilled palpation and monitoring during dry needling.  Trigger Point Dry-Needling  Treatment instructions: Expect mild to moderate muscle soreness. S/S of pneumothorax if dry needled over a lung field, and to seek immediate medical attention should they occur. Patient verbalized understanding of these instructions and education. Patient Consent Given: Yes Education handout provided: Previously provided Muscles treated: R UT  Electrical stimulation performed: No Parameters: N/A Treatment response/outcome: Twitch Response Elicited and Palpable Increase in Muscle Length    PATIENT EDUCATION:  Education details: HEP review Person educated: Patient Education method: Medical illustrator Education  comprehension: verbalized understanding and returned demonstration  HOME EXERCISE PROGRAM: Access Code: [email or web address] URL: [email or web address] Date: 06/11/2023 Prepared by: Harrie Foreman  Exercises - Seated Shoulder Rolls  - 3 x daily - 7 x weekly - 1 sets - 10 reps - Seated Cervical Retraction  - 3 x daily - 7 x weekly - 1 sets - 10 reps - Seated Scapular Retraction  - 3 x daily - 7 x weekly - 1 sets - 10 reps - Gentle Levator Scapulae Stretch  - 3 x daily - 7 x weekly - 1 sets - 3 reps - 10-15 sec hold - Seated Assisted Cervical Rotation with Towel  - 1 x daily - 7 x weekly - 2 sets - 10 reps - 3 sec hold - Cervical Extension AROM with Strap  - 1 x daily - 7 x weekly - 2 sets - 10 reps - 3 sec hold - Sternocleidomastoid Stretch  - 1 x daily - 7 x weekly - 1 sets - 3 reps - 10-15 hold - Median Nerve Flossing - Tray  - 1 x daily - 7 x weekly - 1 sets - 10 reps - 5 sec  hold - Ulnar Nerve Flossing  - 1 x daily - 7 x weekly - 3 sets - 10 reps  ASSESSMENT:  CLINICAL IMPRESSION: Kristen Murray returned today for chronic cervical spine pain and stiffness. She is very stiff in all directions with cervical spine motion, varied treatment techniques today between manual jt mobility techniques and techniques to improve muscular elasticity.  She tolerated well.   Kristen Murray continues to demonstrate potential for improvement and would benefit from continued skilled therapy to address impairments.     OBJECTIVE IMPAIRMENTS: decreased activity tolerance, decreased mobility, decreased ROM, hypomobility, increased muscle spasms, impaired flexibility, postural dysfunction, and pain.   ACTIVITY LIMITATIONS: bending  PARTICIPATION LIMITATIONS: meal prep, cleaning, laundry, driving, and shopping  PERSONAL FACTORS: Age, Time since onset of injury/illness/exacerbation, and 3+ comorbidities: Migraines, arthritis, L knee OA, anemia, DM, bil ankle pain, CAD, chronic Leukopenia, fibromyalgia,  GERD, on Eliquis for Afib/AFlutter.   are also affecting patient's functional outcome.   REHAB POTENTIAL: Good  CLINICAL DECISION MAKING: Evolving/moderate complexity  EVALUATION COMPLEXITY: Moderate   GOALS: Goals reviewed with patient? Yes  SHORT TERM GOALS: Target date: 06/19/2023   Patient will be independent with initial HEP.  Baseline: needs Goal status: MET 06/11/23  LONG TERM GOALS: Target date: 07/17/2023   Patient will be independent with advanced/ongoing HEP to improve outcomes and carryover.  Baseline:  Goal status: IN PROGRESS  2.  Patient will report 75% improvement in  neck pain to improve QOL.  Baseline:  Goal status: IN PROGRESS  3.  Patient will demonstrate at least 60 deg cervical rotation for safety with driving.  Baseline: see objective Goal status: IN PROGRESS  4.  Patient will report 16/50 or less on NDI  to demonstrate improved functional ability.  Baseline: 24/50 Goal status: IN PROGRESS  PLAN:  PT FREQUENCY: 1-2x/week  PT DURATION: 6 weeks  PLANNED INTERVENTIONS: Therapeutic exercises, Therapeutic activity, Neuromuscular re-education, Balance training, Gait training, Patient/Family education, Self Care, Joint mobilization, Joint manipulation, Dry Needling, Electrical stimulation, Spinal manipulation, Spinal mobilization, Cryotherapy, Moist heat, Taping, Traction, Ultrasound, Manual therapy, and Re-evaluation  PLAN FOR NEXT SESSION: HEP for cervical AROM, postural strengthening, stretches, manual therapy, modalities PRN   Marlena Barbato L Donnetta Gillin, PT, DPT [facility] 06/19/2023, 5:24 PM

## 2023-06-25 ENCOUNTER — Encounter: Payer: Self-pay | Admitting: Physical Therapy

## 2023-06-25 ENCOUNTER — Ambulatory Visit: Payer: Medicare Other | Admitting: Physical Therapy

## 2023-06-25 DIAGNOSIS — M542 Cervicalgia: Secondary | ICD-10-CM | POA: Diagnosis not present

## 2023-06-25 DIAGNOSIS — R293 Abnormal posture: Secondary | ICD-10-CM

## 2023-06-25 DIAGNOSIS — R252 Cramp and spasm: Secondary | ICD-10-CM

## 2023-06-25 NOTE — Therapy (Signed)
OUTPATIENT PHYSICAL THERAPY CERVICAL TREATMENT   Patient Name: Kristen Murray MRN: [id number] DOB:[birth year], 77 y.o., female Today's Date: 06/25/2023  END OF SESSION:  PT End of Session - 06/25/23 1534     Visit Number 5    Date for PT Re-Evaluation 07/17/23    Authorization Type [employer] Medicare    Progress Note Due on Visit 10    PT Start Time 1537    PT Stop Time 1619    PT Time Calculation (min) 42 min               Past Medical History:  Diagnosis Date   Arthritis    Borderline abnormal TFTs    as a teen   CAD (coronary artery disease)    Coronary CTA 3/22: Calcium score 1 (38th percentile), minimal nonobstructive CAD with 0-24% in mid RCA; aortic atherosclerosis   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   Nephrolithiasis    Dr Logan Bores, [facility], Hx of [3][   Polyp of colon    Radiculopathy    Past Surgical History:  Procedure Laterality Date   ABDOMINAL HYSTERECTOMY     TAH/BSO --fibroids, no cancer   BALLOON DILATION N/A 07/20/2014   Procedure: BALLOON DILATION;  Surgeon: Charolett Bumpers, MD;  Location: [facility];  Service: Endoscopy;  Laterality: N/A;   BREAST BIOPSY     Right   CARPAL TUNNEL RELEASE     & trigger thumb RUE; Dr Chaney Malling   COLONOSCOPY W/ POLYPECTOMY  07/2004   Neg 2011; Dr Danise Edge   CYSTOSCOPY  11/2009   Neg   ESOPHAGOGASTRODUODENOSCOPY N/A  07/20/2014   Procedure: ESOPHAGOGASTRODUODENOSCOPY (EGD);  Surgeon: Charolett Bumpers, MD;  Location: [facility];  Service: Endoscopy;  Laterality: N/A;   FLEXIBLE BRONCHOSCOPY W/ UPPER ENDOSCOPY     neg   KNEE ARTHROSCOPY     Left   ROTATOR CUFF REPAIR     R shoulder   TONSILLECTOMY     TRIGGER FINGER RELEASE Right    Right Thumb   Patient Active Problem List   Diagnosis Date Noted   Mitral regurgitation 11/10/2022   Shortness of breath 10/04/2022   Allergic rhinitis due to pollen 05/29/2022   Cervical spondylosis without myelopathy 05/29/2022   Hyperglycemia due to type 2 diabetes mellitus (HCC) 05/29/2022   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   Perennial allergic rhinitis 05/29/2022   Pure hypercholesterolemia 05/29/2022   Simple chronic conjunctivitis 05/29/2022   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   Partial thickness rotator cuff tear 03/17/2022   Dysuria 02/28/2022   CAD (coronary artery disease)    Myelodysplasia, 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   Spinal stenosis of lumbar region 07/30/2020   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   Bilateral knee pain 05/11/2020   Atrial flutter (HCC) 01/29/2020   A-fib (HCC) 01/27/2020  Atrial flutter, paroxysmal (HCC) 01/26/2020   Lumbar spondylosis 10/27/2019   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   Iron deficiency anemia due to chronic blood loss 07/18/2019   Closed fracture of proximal phalanx of great toe 09/02/2018   Nuclear sclerosis of both eyes 08/20/2017   High risk medication use 06/15/2017   Recurrent urinary tract infection 10/14/2015   Pain in the chest    Chest pain at rest 11/26/2014   Shingles 05/14/2014   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   Vaginal atrophy 10/16/2012   Menopause 10/16/2012   Chest pain, atypical 02/19/2012   Type 2 diabetes mellitus with neurological manifestations, 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/19/2012   Migraine 01/09/2012   Interstitial cystitis (chronic) without hematuria 12/20/2011   Cervical stenosis of  spinal canal 08/28/2011   Meningioma (HCC) 08/28/2011   COSTOCHONDRITIS 11/16/2010   DISTURBANCES OF SENSATION OF SMELL AND TASTE 04/05/2010   NEPHROLITHIASIS, HX OF 02/08/2010   EDEMA 10/01/2009   Brachial neuritis or radiculitis NOS 06/14/2009   EXTERNAL HEMORRHOIDS 05/25/2009   MUSCLE PAIN 05/17/2009   Hyperlipidemia 04/23/2009   ABNORMAL THYROID FUNCTION TESTS 04/23/2009   MENINGIOMA 12/26/2007   HEADACHE 12/26/2007   RADICULOPATHY 04/02/2007   Essential hypertension 11/21/2006   GERD 11/21/2006    PCP: Delma Officer, PA    REFERRING PROVIDER: refills  REFERRING DIAG: M54.2 (ICD-10-CM) - Cervicalgia S16.1XXA (ICD-10-CM) - Strain of muscle, fascia and tendon at neck level, initial encounter  THERAPY DIAG:  Cervicalgia  Cramp and spasm  Abnormal posture  Rationale for Evaluation and Treatment: Rehabilitation  ONSET DATE: chronic, worsened October 2023  SUBJECTIVE:                                                                                                                                                                                                         SUBJECTIVE STATEMENT: Still having spasms on R side of neck.  Tried to switch sleeping position but can't, she's too used to sleeping on that side.   Doing her new exercises.   Hand dominance: Right  PERTINENT HISTORY:  Migraines, arthritis, L knee OA, anemia, DM, bil ankle pain, CAD, chronic Leukopenia, fibromyalgia, GERD, on Eliquis for Afib/AFlutter.   PAIN:  Are you having pain? Yes: NPRS scale: 6.5/10 Pain location: R side of  neck Pain description: dull ache Aggravating factors: turning neck Relieving factors: rest  PRECAUTIONS: None  RED FLAGS: None     WEIGHT BEARING RESTRICTIONS: No  FALLS:  Has patient fallen in last 6 months? No  LIVING ENVIRONMENT: Lives with: lives with their spouse Lives  in: House/apartment Stairs: Yes: Internal: 17 steps; on right going up and on left  going up and External: 17 steps; on right going up and on left going up has chair lift Has following equipment at home: Single point cane, Walker - 2 wheeled, Tour manager, and Grab bars  OCCUPATION: retired  PLOF: Independent  PATIENT GOALS: get rid of or manage this neck pain as best I can  NEXT MD VISIT:  Jun 18, 2023  OBJECTIVE:   DIAGNOSTIC FINDINGS:  No recent available for review. CT cervical spine 08/04/2019 IMPRESSION: 1. No acute intracranial findings or skull fracture. 2. No acute facial bone fractures. 3. 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  PATIENT SURVEYS:  NDI 24/50= 48%   COGNITION: Overall cognitive status: Within functional limits for tasks assessed  SENSATION: WFL  POSTURE: rounded shoulders and forward head  PALPATION: Tenderness/tightness bil UT, levator scapulae, cervical paraspinals and suboccipitals.  Large lipoma Left scapula  CERVICAL ROM:   Active ROM A/PROM (deg) eval  Flexion 40  Extension 40  Right lateral flexion 30p!  Left lateral flexion 30p!  Right rotation 50p!  Left rotation 50p!   (Blank rows = not tested)  UPPER EXTREMITY ROM:   Shoulder ROM WFL, symmetric and pain free, slightly limited reaching behind back to waist, reports this is normal for her.      UPPER EXTREMITY MMT: 5/5 all myotomes except 4/5 R shoulder ER, has history of R RTC tear.   CERVICAL SPECIAL TESTS:  Spurling's test: Negative  TODAY'S TREATMENT:                                                                                                                              DATE:   06/25/2023 Therapeutic Exercise: to improve strength and mobility.  Demo, verbal and tactile cues throughout for technique. UBE L1 x 5  min forward Manual Therapy: to decrease muscle spasm and pain and improve mobility STM/TPR to cervical paraspinals, R SCM, R Levator, UT UP mobs grade 2-3 and NAGs  into rotation.  Attended Estim: TENS to R UT x 12 min with MHP while education throughout on purpose of TENS, pad placement, how to set up, care of electrodes, and use of TENS machine.     06/19/23:  Manual: supine for alternating bouts of manual cervical distraction, PA and lateral glides C 2 to C 5 region Upper cervical rotation with contract / relax to improve ROM Contract /relax combined with manual stretching for each side for levator, upper traps, and post upper cervical occipital musculature.  Therex.  Instructed/ reviewed snags for 1st rib depression with rotation to ipselateral side, utilizing belt.   06/11/23  Therapeutic Exercise: to improve strength and mobility.  Demo, verbal and tactile cues throughout for technique. UBE L1 fwd/2 min back Nerve glides - tray for median nerve, and ulnar nerve glide Levator stretch SCM stretch SCM  self STM Chin tucks Manual Therapy: to decrease muscle spasm and pain and improve mobility IASTM with s/s tools to R forearm - too tender, focused instead on STM/TPR to R forearm, joint mobs to distal radial/ulnar joint, bil UT, R SCM, R Levator, bil cervical paraspinals.     PATIENT EDUCATION:  Education details: HEP review Person educated: Patient Education method: Medical illustrator Education comprehension: verbalized understanding and returned demonstration  HOME EXERCISE PROGRAM: Access Code: [email or web address] URL: [email or web address] Date: 06/11/2023 Prepared by: Harrie Foreman  Exercises - Seated Shoulder Rolls  - 3 x daily - 7 x weekly - 1 sets - 10 reps - Seated Cervical Retraction  - 3 x daily - 7 x weekly - 1 sets - 10 reps - Seated Scapular Retraction  - 3 x daily - 7 x weekly - 1 sets - 10 reps - Gentle Levator Scapulae Stretch  - 3 x daily - 7 x weekly - 1 sets - 3 reps - 10-15 sec hold - Seated Assisted Cervical Rotation with Towel  - 1 x daily - 7 x weekly - 2 sets - 10 reps - 3 sec hold - Cervical  Extension AROM with Strap  - 1 x daily - 7 x weekly - 2 sets - 10 reps - 3 sec hold - Sternocleidomastoid Stretch  - 1 x daily - 7 x weekly - 1 sets - 3 reps - 10-15 hold - Median Nerve Flossing - Tray  - 1 x daily - 7 x weekly - 1 sets - 10 reps - 5 sec  hold - Ulnar Nerve Flossing  - 1 x daily - 7 x weekly - 3 sets - 10 reps  ASSESSMENT:  CLINICAL IMPRESSION: Kristen Murray returned today for chronic cervical spine pain and stiffness.She still reports pain and spasm primarily on R side.  Tolerated manual therapy well although had very tender area along L/S insertion.  Finished session with TENS + MHP with instruction throughout and given information on where to purchase unit used in clinic since she will be traveling soon and this is recurrent problem.  Reported decreased pain after interventions.   Kristen Murray continues to demonstrate potential for improvement and would benefit from continued skilled therapy to address impairments.     OBJECTIVE IMPAIRMENTS: decreased activity tolerance, decreased mobility, decreased ROM, hypomobility, increased muscle spasms, impaired flexibility, postural dysfunction, and pain.   ACTIVITY LIMITATIONS: bending  PARTICIPATION LIMITATIONS: meal prep, cleaning, laundry, driving, and shopping  PERSONAL FACTORS: Age, Time since onset of injury/illness/exacerbation, and 3+ comorbidities: Migraines, arthritis, L knee OA, anemia, DM, bil ankle pain, CAD, chronic Leukopenia, fibromyalgia, GERD, on Eliquis for Afib/AFlutter.   are also affecting patient's functional outcome.   REHAB POTENTIAL: Good  CLINICAL DECISION MAKING: Evolving/moderate complexity  EVALUATION COMPLEXITY: Moderate   GOALS: Goals reviewed with patient? Yes  SHORT TERM GOALS: Target date: 06/19/2023   Patient will be independent with initial HEP.  Baseline: needs Goal status: MET 06/11/23  LONG TERM GOALS: Target date: 07/17/2023   Patient will be independent with advanced/ongoing HEP  to improve outcomes and carryover.  Baseline:  Goal status: IN PROGRESS  2.  Patient will report 75% improvement in neck pain to improve QOL.  Baseline:  Goal status: IN PROGRESS  3.  Patient will demonstrate at least 60 deg cervical rotation for safety with driving.  Baseline: see objective Goal status: IN PROGRESS  4.  Patient will report 16/50 or less on NDI  to demonstrate improved functional  ability.  Baseline: 24/50 Goal status: IN PROGRESS  PLAN:  PT FREQUENCY: 1-2x/week  PT DURATION: 6 weeks  PLANNED INTERVENTIONS: Therapeutic exercises, Therapeutic activity, Neuromuscular re-education, Balance training, Gait training, Patient/Family education, Self Care, Joint mobilization, Joint manipulation, Dry Needling, Electrical stimulation, Spinal manipulation, Spinal mobilization, Cryotherapy, Moist heat, Taping, Traction, Ultrasound, Manual therapy, and Re-evaluation  PLAN FOR NEXT SESSION: HEP for cervical AROM, postural strengthening, stretches, manual therapy, modalities PRN   Jena Gauss, PT, DPT [facility] 06/25/2023, 5:32 PM

## 2023-06-26 ENCOUNTER — Inpatient Hospital Stay: Payer: Medicare Other

## 2023-06-26 ENCOUNTER — Telehealth: Payer: Self-pay

## 2023-06-26 ENCOUNTER — Encounter: Payer: Self-pay | Admitting: Family

## 2023-06-26 ENCOUNTER — Inpatient Hospital Stay: Payer: Medicare Other | Attending: Hematology & Oncology

## 2023-06-26 ENCOUNTER — Inpatient Hospital Stay: Payer: Medicare Other | Admitting: Family

## 2023-06-26 VITALS — BP 133/55 | HR 69 | Temp 98.2°F | Resp 17 | Wt 168.4 lb

## 2023-06-26 DIAGNOSIS — D46Z Other myelodysplastic syndromes: Secondary | ICD-10-CM

## 2023-06-26 DIAGNOSIS — D4620 Refractory anemia with excess of blasts, unspecified: Secondary | ICD-10-CM | POA: Diagnosis present

## 2023-06-26 DIAGNOSIS — N390 Urinary tract infection, site not specified: Secondary | ICD-10-CM | POA: Diagnosis not present

## 2023-06-26 DIAGNOSIS — D500 Iron deficiency anemia secondary to blood loss (chronic): Secondary | ICD-10-CM

## 2023-06-26 DIAGNOSIS — D72819 Decreased white blood cell count, unspecified: Secondary | ICD-10-CM

## 2023-06-26 DIAGNOSIS — Z87440 Personal history of urinary (tract) infections: Secondary | ICD-10-CM | POA: Insufficient documentation

## 2023-06-26 LAB — CBC WITH DIFFERENTIAL (CANCER CENTER ONLY)
Abs Immature Granulocytes: 0.14 10*3/uL — ABNORMAL HIGH (ref 0.00–0.07)
Basophils Absolute: 0 10*3/uL (ref 0.0–0.1)
Basophils Relative: 0 %
Eosinophils Absolute: 0 10*3/uL (ref 0.0–0.5)
Eosinophils Relative: 0 %
HCT: 34.6 % — ABNORMAL LOW (ref 36.0–46.0)
Hemoglobin: 11 g/dL — ABNORMAL LOW (ref 12.0–15.0)
Immature Granulocytes: 6 %
Lymphocytes Relative: 66 %
Lymphs Abs: 1.6 10*3/uL (ref 0.7–4.0)
MCH: 27.6 pg (ref 26.0–34.0)
MCHC: 31.8 g/dL (ref 30.0–36.0)
MCV: 86.7 fL (ref 80.0–100.0)
Monocytes Absolute: 0.3 10*3/uL (ref 0.1–1.0)
Monocytes Relative: 14 %
Neutro Abs: 0.3 10*3/uL — CL (ref 1.7–7.7)
Neutrophils Relative %: 14 %
Platelet Count: 151 10*3/uL (ref 150–400)
RBC: 3.99 MIL/uL (ref 3.87–5.11)
RDW: 17.7 % — ABNORMAL HIGH (ref 11.5–15.5)
Smear Review: NORMAL
WBC Count: 2.3 10*3/uL — ABNORMAL LOW (ref 4.0–10.5)
nRBC: 0 % (ref 0.0–0.2)

## 2023-06-26 LAB — CMP (CANCER CENTER ONLY)
ALT: 13 U/L (ref 0–44)
AST: 15 U/L (ref 15–41)
Albumin: 3.6 g/dL (ref 3.5–5.0)
Alkaline Phosphatase: 72 U/L (ref 38–126)
Anion gap: 5 (ref 5–15)
BUN: 16 mg/dL (ref 8–23)
CO2: 31 mmol/L (ref 22–32)
Calcium: 9.7 mg/dL (ref 8.9–10.3)
Chloride: 102 mmol/L (ref 98–111)
Creatinine: 0.81 mg/dL (ref 0.44–1.00)
GFR, Estimated: >60 mL/min (ref >60)
Glucose, Bld: 213 mg/dL — ABNORMAL HIGH (ref 70–99)
Potassium: 4.3 mmol/L (ref 3.5–5.1)
Sodium: 138 mmol/L (ref 135–145)
Total Bilirubin: 0.2 mg/dL — ABNORMAL LOW (ref 0.3–1.2)
Total Protein: 7.2 g/dL (ref 6.5–8.1)

## 2023-06-26 LAB — RETICULOCYTES
Immature Retic Fract: 10.3 % (ref 2.3–15.9)
RBC.: 4.01 MIL/uL (ref 3.87–5.11)
Retic Count, Absolute: 30.9 10*3/uL (ref 19.0–186.0)
Retic Ct Pct: 0.8 % (ref 0.4–3.1)

## 2023-06-26 LAB — IRON AND IRON BINDING CAPACITY (CC-WL,HP ONLY)
Iron: 21 ug/dL — ABNORMAL LOW (ref 28–170)
Saturation Ratios: 10 % — ABNORMAL LOW (ref 10.4–31.8)
TIBC: 209 ug/dL — ABNORMAL LOW (ref 250–450)
UIBC: 188 ug/dL (ref 148–442)

## 2023-06-26 LAB — FERRITIN: Ferritin: 640 ng/mL — ABNORMAL HIGH (ref 11–307)

## 2023-06-26 MED ORDER — PEGFILGRASTIM-CBQV 6 MG/0.6ML ~~LOC~~ SOSY
6.0000 mg | PREFILLED_SYRINGE | Freq: Once | SUBCUTANEOUS | Status: AC
  Administered 2023-06-26: 6 mg via SUBCUTANEOUS
  Filled 2023-06-26: qty 0.6

## 2023-06-26 NOTE — Telephone Encounter (Signed)
Critical results received from lab ANC 0.34. Eileen Stanford, NP informed. No new orders received.

## 2023-06-26 NOTE — Progress Notes (Signed)
Hematology and Oncology Follow Up Visit  Kristen Murray [id number] 1946-03-15 77 y.o. 06/26/2023   Principle Diagnosis:  Myelodysplasia -- low grade -- IPSS-R == 1.5 --  DNMT3A (+) Iron deficiency anemia-malabsorption   Current Therapy:        IV iron as indicated   Neulasta 6 mcg sq PRN prior to procedures Aranesp 300 mcg sq for Hgb < 11   Interim History:  Kristen Murray is here today for follow-up. She is doing fairly well but has had an issue with recurrent UTI and just finished her second round of antibiotics. Her symptoms are much improved.  No fever, chills, n/v, rash, dizziness, SOB, chest pain, palpitations, abdominal pain or changes in bowel or bladder habits.  Dry cough with allergies that comes and goes.  No swelling, tenderness, numbness or tingling in her extremities.  No falls or syncope.  Appetite and hydration are good. Weight is 168 lbs.  She will be travelling next week to the [employer] for an [place] cruise. She plans to wear a mask while on the ship and in crowds.   ECOG Performance Status: 1 - Symptomatic but completely ambulatory  Medications:  Allergies as of 06/26/2023       Reactions   Codeine Hives, Other (See Comments)   Because of a history of documented adverse serious drug reaction, Medi Alert bracelet  is recommended   Macrodantin [nitrofurantoin Macrocrystal] Other (See Comments)   Numbness, aching all over   Other Shortness Of Breath, Rash, Other (See Comments)   Pine nuts & walnuts throat congestion. No documented angioedema.   Propoxyphene Hives, Swelling, Rash, Other (See Comments)   Propoxyphene Hcl Hives, Swelling, Rash   Dulaglutide Other (See Comments)   Other reaction(s): weak, ha, no appetite   Latex Rash, Other (See Comments)   Metformin Hcl Other (See Comments)   Other reaction(s): leg discomfort   Neosporin [bacitracin-polymyxin B] Other (See Comments)   Blisters   Zoster Vac Recomb Adjuvanted Rash, Other (See Comments)   Other  reaction(s): confusion   Augmentin [amoxicillin-pot Clavulanate] Itching   Avocado Rash   Banana Rash   Ciprofloxacin Nausea Only, Other (See Comments)   Nausea (Note: Patient takes this when on mission trips, however)   Tizanidine Other (See Comments)   Reaction not recalled   Tramadol Nausea Only        Medication List        Accurate as of June 26, 2023  1:07 PM. If you have any questions, ask your nurse or doctor.          acetaminophen 325 MG tablet Commonly known as: TYLENOL Take 325-650 mg by mouth every 6 (six) hours as needed (for pain).   ALPRAZolam 0.25 MG tablet Commonly known as: XANAX Take 0.25 mg by mouth 2 (two) times daily as needed.   azelastine 0.1 % nasal spray Commonly known as: ASTELIN Place into both nostrils 2 (two) times daily. Use in each nostril as directed   b complex vitamins capsule daily.   cetirizine 10 MG tablet Commonly known as: ZYRTEC Take 1 tablet (10 mg total) by mouth daily as needed for allergies (Can take an extra dose during flare ups.).   Cholecalciferol 25 MCG (1000 UT) capsule Take 1,000 Units by mouth daily.   clotrimazole-betamethasone cream Commonly known as: Lotrisone Apply 1 Application topically 2 (two) times daily. Use for 2 weeks as needed.  Apply to area twice weekly at bedtime for maintenance dosing.   cyclobenzaprine 10  MG tablet Commonly known as: FLEXERIL Take 2.5 mg by mouth daily as needed for muscle spasms.   dextromethorphan-guaiFENesin 30-600 MG 12hr tablet Commonly known as: MUCINEX DM Take 1 tablet by mouth 2 (two) times daily.   diclofenac 0.1 % ophthalmic solution Commonly known as: VOLTAREN 4 (four) times daily as needed.   DropSafe Alcohol Prep 70 % Pads Apply topically.   Eliquis 5 MG Tabs tablet Generic drug: apixaban TAKE 1 TABLET BY MOUTH TWICE  DAILY   EPINEPHrine 0.3 mg/0.3 mL Soaj injection Commonly known as: EPI-PEN Inject 0.3 mg into the muscle as needed for  anaphylaxis.   fluorometholone 0.1 % ophthalmic suspension Commonly known as: FML Place 1 drop into both eyes every Monday, Wednesday, and Friday.   fluticasone 50 MCG/ACT nasal spray Commonly known as: FLONASE Place 1 spray into both nostrils 2 (two) times daily.   folic acid 400 MCG tablet Commonly known as: FOLVITE daily.   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   GaviLyte-G 236 g solution Generic drug: polyethylene glycol   glipiZIDE 10 MG 24 hr tablet Commonly known as: GLUCOTROL XL Take 15 mg by mouth daily. Takes 15 mg   glucose blood test strip Inject 1 strip into the skin daily.   glucose blood test strip Commonly known as: FREESTYLE LITE CHECK BLOOD SUGAR ONCE DAILY AS DIRECTED.  DX:790.29   ipratropium 0.06 % nasal spray Commonly known as: ATROVENT Place 2 sprays into both nostrils 3 (three) times daily.   lidocaine 5 % ointment Commonly known as: XYLOCAINE Apply 1 application topically 3 (three) times daily as needed.   loratadine 10 MG tablet Commonly known as: CLARITIN Take 1 tablet (10 mg total) by mouth daily.   metoprolol tartrate 50 MG tablet Commonly known as: LOPRESSOR TAKE 1 AND 1/2 TABLETS TWICE DAILY   montelukast 10 MG tablet Commonly known as: SINGULAIR TAKE 1 TABLET BY MOUTH AT  BEDTIME   multivitamin tablet Take 1 tablet by mouth daily. Shaklee brand   nitroGLYCERIN 0.4 MG SL tablet Commonly known as: NITROSTAT Place 1 tablet (0.4 mg total) under the tongue every 5 (five) minutes as needed for chest pain.   pantoprazole 40 MG tablet Commonly known as: PROTONIX Take 1 tablet (40 mg total) by mouth 2 (two) times daily.   rosuvastatin 5 MG tablet Commonly known as: CRESTOR Take 5 mg by mouth See admin instructions. Take 5 mg by mouth at bedtime on Mondays and Thursdays..Currently on hold    traMADol 50 MG tablet Commonly known as: ULTRAM Take 1 tablet 4 times a day by oral route as needed.   tretinoin 0.025 % cream Commonly known as: RETIN-A Apply 1 application. topically at bedtime.   trolamine salicylate 10 % cream Commonly known as: ASPERCREME Apply 1 application topically as needed for muscle pain.   valACYclovir 500 MG tablet Commonly known as: VALTREX Take 1 tablet (500 mg total) by mouth 2 (two) times daily. Take twice a day for thee days as needed for an outbreak.   verapamil 180 MG CR tablet Commonly known as: CALAN-SR Take 180 mg by mouth at bedtime.        Allergies:  Allergies  Allergen Reactions   Codeine Hives and Other (See Comments)    Because of a history of documented adverse serious drug reaction, Medi Alert bracelet  is recommended  Macrodantin [Nitrofurantoin Macrocrystal] Other (See Comments)    Numbness, aching all over   Other Shortness Of Breath, Rash and Other (See Comments)    Pine nuts & walnuts throat congestion. No documented angioedema.    Propoxyphene Hives, Swelling, Rash and Other (See Comments)   Propoxyphene Hcl Hives, Swelling and Rash   Dulaglutide Other (See Comments)    Other reaction(s): weak, ha, no appetite   Latex Rash and Other (See Comments)   Metformin Hcl Other (See Comments)    Other reaction(s): leg discomfort   Neosporin [Bacitracin-Polymyxin B] Other (See Comments)    Blisters   Zoster Vac Recomb Adjuvanted Rash and Other (See Comments)    Other reaction(s): confusion   Augmentin [Amoxicillin-Pot Clavulanate] Itching   Avocado Rash   Banana Rash   Ciprofloxacin Nausea Only and Other (See Comments)    Nausea (Note: Patient takes this when on mission trips, however)   Tizanidine Other (See Comments)    Reaction not recalled   Tramadol Nausea Only    Past Medical History, Surgical history, Social history, and Family History were reviewed and updated.  Review of Systems: All other 10 point  review of systems is negative.   Physical Exam:  vitals were not taken for this visit.   Wt Readings from Last 3 Encounters:  06/06/23 168 lb (76.2 kg)  05/15/23 168 lb 12.8 oz (76.6 kg)  04/17/23 167 lb (75.8 kg)    Ocular: Sclerae unicteric, pupils equal, round and reactive to light Ear-nose-throat: Oropharynx clear, dentition fair Lymphatic: No cervical or supraclavicular adenopathy Lungs no rales or rhonchi, good excursion bilaterally Heart regular rate and rhythm, no murmur appreciated Abd soft, nontender, positive bowel sounds MSK no focal spinal tenderness, no joint edema Neuro: non-focal, well-oriented, appropriate affect Breasts: Deferred   Lab Results  Component Value Date   WBC 2.5 (L) 05/15/2023   HGB 10.2 (L) 05/15/2023   HCT 32.0 (L) 05/15/2023   MCV 85.1 05/15/2023   PLT 150 05/15/2023   Lab Results  Component Value Date   FERRITIN 935 (H) 05/15/2023   IRON 32 05/15/2023   TIBC 218 (L) 05/15/2023   UIBC 186 05/15/2023   IRONPCTSAT 15 05/15/2023   Lab Results  Component Value Date   RETICCTPCT 0.8 07/20/2022   RBC 3.76 (L) 05/15/2023   No results found for: "KPAFRELGTCHN", "LAMBDASER", "KAPLAMBRATIO" No results found for: "IGGSERUM", "IGA", "IGMSERUM" No results found for: "TOTALPROTELP", "ALBUMINELP", "A1GS", "A2GS", "BETS", "BETA2SER", "GAMS", "MSPIKE", "SPEI"   Chemistry      Component Value Date/Time   NA 135 05/15/2023 1439   NA 138 03/10/2020 1138   K 3.9 05/15/2023 1439   CL 101 05/15/2023 1439   CO2 28 05/15/2023 1439   BUN 15 05/15/2023 1439   BUN 10 03/10/2020 1138   CREATININE 0.59 05/15/2023 1439      Component Value Date/Time   CALCIUM 9.8 05/15/2023 1439   ALKPHOS 79 05/15/2023 1439   AST 17 05/15/2023 1439   ALT 18 05/15/2023 1439   BILITOT 0.3 05/15/2023 1439       Impression and Plan: Kristen Murray is a very pleasant 77 yo African American female with low-grade MDS, low IPSS-R score.  No ESA needed, Hgb 11.0.  Udenyca  given for ANC 0.3, WBC count 2.3 for recurrent UTI and upcoming travel.  Iron studies are pending.  We will follow-up in 6 weeks  Eileen Stanford, NP 8/27/20241:07 PM

## 2023-06-26 NOTE — Patient Instructions (Signed)

## 2023-06-28 ENCOUNTER — Ambulatory Visit: Payer: Medicare Other

## 2023-06-28 ENCOUNTER — Other Ambulatory Visit: Payer: Self-pay

## 2023-06-28 DIAGNOSIS — M542 Cervicalgia: Secondary | ICD-10-CM

## 2023-06-28 DIAGNOSIS — R252 Cramp and spasm: Secondary | ICD-10-CM

## 2023-06-28 DIAGNOSIS — R293 Abnormal posture: Secondary | ICD-10-CM

## 2023-06-28 NOTE — Therapy (Signed)
OUTPATIENT PHYSICAL THERAPY CERVICAL TREATMENT   Patient Name: Kristen Murray MRN: [id number] DOB:Feb 18, 1946, 77 y.o., female Today's Date: 06/28/2023  END OF SESSION:  PT End of Session - 06/28/23 1435     Visit Number 6    Date for PT Re-Evaluation 07/17/23    Authorization Type [employer] Medicare    Progress Note Due on Visit 10    PT Start Time 1400    PT Stop Time 1443    PT Time Calculation (min) 43 min                Past Medical History:  Diagnosis Date   Arthritis    Borderline abnormal TFTs    as a teen   CAD (coronary artery disease)    Coronary CTA 3/22: Calcium score 1 (38th percentile), minimal nonobstructive CAD with 0-24% in mid RCA; aortic atherosclerosis   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   Nephrolithiasis    Dr Logan Bores, [facility], Hx of [3][   Polyp of colon    Radiculopathy    Past Surgical History:  Procedure Laterality Date   ABDOMINAL HYSTERECTOMY     TAH/BSO --fibroids, no cancer   BALLOON DILATION N/A 07/20/2014   Procedure: BALLOON DILATION;  Surgeon: Charolett Bumpers, MD;  Location: [facility];  Service: Endoscopy;  Laterality: N/A;   BREAST BIOPSY     Right   CARPAL TUNNEL RELEASE     & trigger thumb RUE; Dr Chaney Malling   COLONOSCOPY W/ POLYPECTOMY  07/2004   Neg 2011; Dr Danise Edge   CYSTOSCOPY  11/2009   Neg   ESOPHAGOGASTRODUODENOSCOPY N/A  07/20/2014   Procedure: ESOPHAGOGASTRODUODENOSCOPY (EGD);  Surgeon: Charolett Bumpers, MD;  Location: [facility];  Service: Endoscopy;  Laterality: N/A;   FLEXIBLE BRONCHOSCOPY W/ UPPER ENDOSCOPY     neg   KNEE ARTHROSCOPY     Left   ROTATOR CUFF REPAIR     R shoulder   TONSILLECTOMY     TRIGGER FINGER RELEASE Right    Right Thumb   Patient Active Problem List   Diagnosis Date Noted   Mitral regurgitation 11/10/2022   Shortness of breath 10/04/2022   Allergic rhinitis due to pollen 05/29/2022   Cervical spondylosis without myelopathy 05/29/2022   Hyperglycemia due to type 2 diabetes mellitus (HCC) 05/29/2022   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   Perennial allergic rhinitis 05/29/2022   Pure hypercholesterolemia 05/29/2022   Simple chronic conjunctivitis 05/29/2022   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   Partial thickness rotator cuff tear 03/17/2022   Dysuria 02/28/2022   CAD (coronary artery disease)    Myelodysplasia, 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   Spinal stenosis of lumbar region 07/30/2020   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   Bilateral knee pain 05/11/2020   Atrial flutter (HCC) 01/29/2020   A-fib (HCC) 01/27/2020  Atrial flutter, paroxysmal (HCC) 01/26/2020   Lumbar spondylosis 10/27/2019   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   Iron deficiency anemia due to chronic blood loss 07/18/2019   Closed fracture of proximal phalanx of great toe 09/02/2018   Nuclear sclerosis of both eyes 08/20/2017   High risk medication use 06/15/2017   Recurrent urinary tract infection 10/14/2015   Pain in the chest    Chest pain at rest 11/26/2014   Shingles 05/14/2014   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   Vaginal atrophy 10/16/2012   Menopause 10/16/2012   Chest pain, atypical 02/19/2012   Type 2 diabetes mellitus with neurological manifestations, 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/19/2012   Migraine 01/09/2012   Interstitial cystitis (chronic) without hematuria 12/20/2011   Cervical stenosis of  spinal canal 08/28/2011   Meningioma (HCC) 08/28/2011   COSTOCHONDRITIS 11/16/2010   DISTURBANCES OF SENSATION OF SMELL AND TASTE 04/05/2010   NEPHROLITHIASIS, HX OF 02/08/2010   EDEMA 10/01/2009   Brachial neuritis or radiculitis NOS 06/14/2009   EXTERNAL HEMORRHOIDS 05/25/2009   MUSCLE PAIN 05/17/2009   Hyperlipidemia 04/23/2009   ABNORMAL THYROID FUNCTION TESTS 04/23/2009   MENINGIOMA 12/26/2007   HEADACHE 12/26/2007   RADICULOPATHY 04/02/2007   Essential hypertension 11/21/2006   GERD 11/21/2006    PCP: Delma Officer, PA    REFERRING PROVIDER: refills  REFERRING DIAG: M54.2 (ICD-10-CM) - Cervicalgia S16.1XXA (ICD-10-CM) - Strain of muscle, fascia and tendon at neck level, initial encounter  THERAPY DIAG:  Cervicalgia  Cramp and spasm  Abnormal posture  Rationale for Evaluation and Treatment: Rehabilitation  ONSET DATE: chronic, worsened October 2023  SUBJECTIVE:                                                                                                                                                                                                         SUBJECTIVE STATEMENT: Still having pain and stiffness on R side of neck.  More issues with lower back today. To go on long trip starting next Friday, cruise for 10 days  Hand dominance: Right  PERTINENT HISTORY:  Migraines, arthritis, L knee OA, anemia, DM, bil ankle pain, CAD, chronic Leukopenia, fibromyalgia, GERD, on Eliquis for Afib/AFlutter.   PAIN:  Are you having pain? Yes: NPRS scale: 2-4/10 Pain location: R side of  neck Pain description: dull ache Aggravating factors: turning neck Relieving factors: rest  PRECAUTIONS: None  RED FLAGS: None     WEIGHT BEARING RESTRICTIONS: No  FALLS:  Has patient fallen in last 6 months? No  LIVING ENVIRONMENT: Lives with: lives with their spouse Lives in: House/apartment  Stairs: Yes: Internal: 17 steps; on right going up and on left going up  and External: 17 steps; on right going up and on left going up has chair lift Has following equipment at home: Single point cane, Walker - 2 wheeled, Tour manager, and Grab bars  OCCUPATION: retired  PLOF: Independent  PATIENT GOALS: get rid of or manage this neck pain as best I can  NEXT MD VISIT:  Jun 18, 2023  OBJECTIVE:   DIAGNOSTIC FINDINGS:  No recent available for review. CT cervical spine 08/04/2019 IMPRESSION: 1. No acute intracranial findings or skull fracture. 2. No acute facial bone fractures. 3. 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  PATIENT SURVEYS:  NDI 24/50= 48%   COGNITION: Overall cognitive status: Within functional limits for tasks assessed  SENSATION: WFL  POSTURE: rounded shoulders and forward head  PALPATION: Tenderness/tightness bil UT, levator scapulae, cervical paraspinals and suboccipitals.  Large lipoma Left scapula  CERVICAL ROM:   Active ROM A/PROM (deg) eval 06/28/23:  Flexion 40 70  Extension 40   Right lateral flexion 30p!   Left lateral flexion 30p!   Right rotation 50p! 65  Left rotation 50p! 75   (Blank rows = not tested)  UPPER EXTREMITY ROM:   Shoulder ROM WFL, symmetric and pain free, slightly limited reaching behind back to waist, reports this is normal for her.      UPPER EXTREMITY MMT: 5/5 all myotomes except 4/5 R shoulder ER, has history of R RTC tear.   CERVICAL SPECIAL TESTS:  Spurling's test: Negative  TODAY'S TREATMENT:                                                                                                                              DATE:  06/27/23: Manual:  supine for alternating bouts of manual cervical distraction, PA and lateral glides C 2 to C 5 region Upper cervical rotation with contract / relax to improve ROM, primarily for R rotation Contract /relax combined with manual stretching for each side for levator, upper  traps, and post upper cervical occipital musculature. Seated for post 1st rib depression with ipsilateral rotation, 10 reps, each side  Therex for periscapular strengthening and support for cervical spine,  Yellow t band B shoulder ER 15 reps, small amplitude Red t band rows, band under feet, to engage middle traps musculature 15 reps Red t band scapular punches, 15 rpes  06/25/2023 Therapeutic Exercise: to improve strength and mobility.  Demo, verbal and tactile cues throughout for technique. UBE L1 x 5  min forward Manual Therapy: to decrease muscle spasm and pain and improve mobility STM/TPR to cervical paraspinals, R SCM, R Levator, UT UP mobs grade 2-3 and NAGs into rotation.  Attended Estim: TENS to R UT x 12 min with MHP while education throughout on purpose of TENS, pad placement, how to set up, care of electrodes, and use of TENS  machine.     06/19/23:  Manual: supine for alternating bouts of manual cervical distraction, PA and lateral glides C 2 to C 5 region Upper cervical rotation with contract / relax to improve ROM Contract /relax combined with manual stretching for each side for levator, upper traps, and post upper cervical occipital musculature.  Therex.  Instructed/ reviewed snags for 1st rib depression with rotation to ipselateral side, utilizing belt.   06/11/23  Therapeutic Exercise: to improve strength and mobility.  Demo, verbal and tactile cues throughout for technique. UBE L1 fwd/2 min back Nerve glides - tray for median nerve, and ulnar nerve glide Levator stretch SCM stretch SCM self STM Chin tucks Manual Therapy: to decrease muscle spasm and pain and improve mobility IASTM with s/s tools to R forearm - too tender, focused instead on STM/TPR to R forearm, joint mobs to distal radial/ulnar joint, bil UT, R SCM, R Levator, bil cervical paraspinals.     PATIENT EDUCATION:  Education details: HEP review Person educated: Patient Education method: Fish farm manager Education comprehension: verbalized understanding and returned demonstration  HOME EXERCISE PROGRAM: Access Code: [email or web address] URL: [email or web address] Date: 06/11/2023 Prepared by: Harrie Foreman  Exercises - Seated Shoulder Rolls  - 3 x daily - 7 x weekly - 1 sets - 10 reps - Seated Cervical Retraction  - 3 x daily - 7 x weekly - 1 sets - 10 reps - Seated Scapular Retraction  - 3 x daily - 7 x weekly - 1 sets - 10 reps - Gentle Levator Scapulae Stretch  - 3 x daily - 7 x weekly - 1 sets - 3 reps - 10-15 sec hold - Seated Assisted Cervical Rotation with Towel  - 1 x daily - 7 x weekly - 2 sets - 10 reps - 3 sec hold - Cervical Extension AROM with Strap  - 1 x daily - 7 x weekly - 2 sets - 10 reps - 3 sec hold - Sternocleidomastoid Stretch  - 1 x daily - 7 x weekly - 1 sets - 3 reps - 10-15 hold - Median Nerve Flossing - Tray  - 1 x daily - 7 x weekly - 1 sets - 10 reps - 5 sec  hold - Ulnar Nerve Flossing  - 1 x daily - 7 x weekly - 3 sets - 10 reps  ASSESSMENT:  CLINICAL IMPRESSION: Kristen Murray returned today for chronic cervical spine pain and stiffness.She still reports pain and spasm primarily on R side.  Tolerated manual therapy well.overall improved cervical spine ROM as compared to initial visit.  She has not purchased TENs unit but plans to pursue when able. Introduced other strengthening ex for her upper thoracic musculature today.  She will be going out of town next week and plans to return back the final week of her plan of care , ending on Sept 17.  Will reassess on that visit.  Kristen Murray continues to demonstrate potential for improvement and would benefit from continued skilled therapy to address impairments.     OBJECTIVE IMPAIRMENTS: decreased activity tolerance, decreased mobility, decreased ROM, hypomobility, increased muscle spasms, impaired flexibility, postural dysfunction, and pain.   ACTIVITY LIMITATIONS:  bending  PARTICIPATION LIMITATIONS: meal prep, cleaning, laundry, driving, and shopping  PERSONAL FACTORS: Age, Time since onset of injury/illness/exacerbation, and 3+ comorbidities: Migraines, arthritis, L knee OA, anemia, DM, bil ankle pain, CAD, chronic Leukopenia, fibromyalgia, GERD, on Eliquis for Afib/AFlutter.   are also affecting patient's functional outcome.  REHAB POTENTIAL: Good  CLINICAL DECISION MAKING: Evolving/moderate complexity  EVALUATION COMPLEXITY: Moderate   GOALS: Goals reviewed with patient? Yes  SHORT TERM GOALS: Target date: 06/19/2023   Patient will be independent with initial HEP.  Baseline: needs Goal status: MET 06/11/23  LONG TERM GOALS: Target date: 07/17/2023   Patient will be independent with advanced/ongoing HEP to improve outcomes and carryover.  Baseline:  Goal status: IN PROGRESS  2.  Patient will report 75% improvement in neck pain to improve QOL.  Baseline:  Goal status: IN PROGRESS  3.  Patient will demonstrate at least 60 deg cervical rotation for safety with driving.  Baseline: see objective Goal status: IN PROGRESS  4.  Patient will report 16/50 or less on NDI  to demonstrate improved functional ability.  Baseline: 24/50 Goal status: IN PROGRESS  PLAN:  PT FREQUENCY: 1-2x/week  PT DURATION: 6 weeks  PLANNED INTERVENTIONS: Therapeutic exercises, Therapeutic activity, Neuromuscular re-education, Balance training, Gait training, Patient/Family education, Self Care, Joint mobilization, Joint manipulation, Dry Needling, Electrical stimulation, Spinal manipulation, Spinal mobilization, Cryotherapy, Moist heat, Taping, Traction, Ultrasound, Manual therapy, and Re-evaluation  PLAN FOR NEXT SESSION: HEP for cervical AROM, postural strengthening, stretches, manual therapy, modalities PRN   Madisin Hasan L Emilynn Srinivasan, PT, DPT [facility] 06/28/2023, 2:48 PM

## 2023-07-16 ENCOUNTER — Inpatient Hospital Stay: Payer: Medicare Other | Attending: Hematology & Oncology

## 2023-07-16 VITALS — BP 138/73 | HR 66 | Temp 97.7°F | Resp 17

## 2023-07-16 DIAGNOSIS — D508 Other iron deficiency anemias: Secondary | ICD-10-CM | POA: Insufficient documentation

## 2023-07-16 DIAGNOSIS — D72819 Decreased white blood cell count, unspecified: Secondary | ICD-10-CM

## 2023-07-16 DIAGNOSIS — K909 Intestinal malabsorption, unspecified: Secondary | ICD-10-CM | POA: Diagnosis present

## 2023-07-16 DIAGNOSIS — D500 Iron deficiency anemia secondary to blood loss (chronic): Secondary | ICD-10-CM

## 2023-07-16 DIAGNOSIS — D46Z Other myelodysplastic syndromes: Secondary | ICD-10-CM

## 2023-07-16 MED ORDER — SODIUM CHLORIDE 0.9 % IV SOLN
200.0000 mg | Freq: Once | INTRAVENOUS | Status: AC
  Administered 2023-07-16: 200 mg via INTRAVENOUS
  Filled 2023-07-16: qty 200

## 2023-07-16 MED ORDER — SODIUM CHLORIDE 0.9 % IV SOLN: INTRAVENOUS | Status: DC

## 2023-07-16 NOTE — Patient Instructions (Signed)
Iron Sucrose Injection What is this medication? IRON SUCROSE (EYE ern SOO krose) treats low levels of iron (iron deficiency anemia) in people with kidney disease. Iron is a mineral that plays an important role in making red blood cells, which carry oxygen from your lungs to the rest of your body. This medicine may be used for other purposes; ask your health care provider or pharmacist if you have questions. COMMON BRAND NAME(S): Venofer What should I tell my care team before I take this medication? They need to know if you have any of these conditions: Anemia not caused by low iron levels Heart disease High levels of iron in the blood Kidney disease Liver disease An unusual or allergic reaction to iron, other medications, foods, dyes, or preservatives Pregnant or trying to get pregnant Breastfeeding How should I use this medication? This medication is for infusion into a vein. It is given in a hospital or clinic setting. Talk to your care team about the use of this medication in children. While this medication may be prescribed for children as young as 2 years for selected conditions, precautions do apply. Overdosage: If you think you have taken too much of this medicine contact a poison control center or emergency room at once. NOTE: This medicine is only for you. Do not share this medicine with others. What if I miss a dose? Keep appointments for follow-up doses. It is important not to miss your dose. Call your care team if you are unable to keep an appointment. What may interact with this medication? Do not take this medication with any of the following: Deferoxamine Dimercaprol Other iron products This medication may also interact with the following: Chloramphenicol Deferasirox This list may not describe all possible interactions. Give your health care provider a list of all the medicines, herbs, non-prescription drugs, or dietary supplements you use. Also tell them if you smoke,  drink alcohol, or use illegal drugs. Some items may interact with your medicine. What should I watch for while using this medication? Visit your care team regularly. Tell your care team if your symptoms do not start to get better or if they get worse. You may need blood work done while you are taking this medication. You may need to follow a special diet. Talk to your care team. Foods that contain iron include: whole grains/cereals, dried fruits, beans, or peas, leafy green vegetables, and organ meats (liver, kidney). What side effects may I notice from receiving this medication? Side effects that you should report to your care team as soon as possible: Allergic reactions--skin rash, itching, hives, swelling of the face, lips, tongue, or throat Low blood pressure--dizziness, feeling faint or lightheaded, blurry vision Shortness of breath Side effects that usually do not require medical attention (report to your care team if they continue or are bothersome): Flushing Headache Joint pain Muscle pain Nausea Pain, redness, or irritation at injection site This list may not describe all possible side effects. Call your doctor for medical advice about side effects. You may report side effects to FDA at 1-800-FDA-1088. Where should I keep my medication? This medication is given in a hospital or clinic. It will not be stored at home. NOTE: This sheet is a summary. It may not cover all possible information. If you have questions about this medicine, talk to your doctor, pharmacist, or health care provider.  2024 Elsevier/Gold Standard (2023-03-23 00:00:00)

## 2023-07-17 ENCOUNTER — Ambulatory Visit: Payer: Medicare Other | Attending: Physician Assistant

## 2023-07-17 ENCOUNTER — Other Ambulatory Visit: Payer: Self-pay

## 2023-07-17 DIAGNOSIS — R252 Cramp and spasm: Secondary | ICD-10-CM | POA: Diagnosis present

## 2023-07-17 DIAGNOSIS — M542 Cervicalgia: Secondary | ICD-10-CM | POA: Diagnosis present

## 2023-07-17 DIAGNOSIS — R293 Abnormal posture: Secondary | ICD-10-CM | POA: Insufficient documentation

## 2023-07-17 NOTE — Therapy (Unsigned)
OUTPATIENT PHYSICAL THERAPY CERVICAL TREATMENT/DISCHARGE SUMMARY   Patient Name: Kristen Murray MRN: [id number] DOB:1946/03/25, 77 y.o., female Today's Date: 07/18/2023  END OF SESSION:  PT End of Session - 07/17/23 1404     Visit Number 7    Date for PT Re-Evaluation 07/17/23    Authorization Type [employer] Medicare    Progress Note Due on Visit 10    PT Start Time 1400    PT Stop Time 1443    PT Time Calculation (min) 43 min    Activity Tolerance Patient tolerated treatment well    Behavior During Therapy WFL for tasks assessed/performed                 Past Medical History:  Diagnosis Date   Arthritis    Borderline abnormal TFTs    as a teen   CAD (coronary artery disease)    Coronary CTA 3/22: Calcium score 1 (38th percentile), minimal nonobstructive CAD with 0-24% in mid RCA; aortic atherosclerosis   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   Nephrolithiasis    Dr Logan Bores, [facility], Hx of [3][   Polyp of colon    Radiculopathy    Past Surgical History:  Procedure Laterality Date   ABDOMINAL HYSTERECTOMY     TAH/BSO --fibroids, no cancer   BALLOON DILATION N/A 07/20/2014   Procedure: BALLOON DILATION;  Surgeon: Charolett Bumpers, MD;  Location: [facility];  Service: Endoscopy;  Laterality: N/A;   BREAST BIOPSY     Right   CARPAL TUNNEL RELEASE     & trigger thumb RUE; Dr  Chaney Malling   COLONOSCOPY W/ POLYPECTOMY  07/2004   Neg 2011; Dr Danise Edge   CYSTOSCOPY  11/2009   Neg   ESOPHAGOGASTRODUODENOSCOPY N/A 07/20/2014   Procedure: ESOPHAGOGASTRODUODENOSCOPY (EGD);  Surgeon: Charolett Bumpers, MD;  Location: [facility];  Service: Endoscopy;  Laterality: N/A;   FLEXIBLE BRONCHOSCOPY W/ UPPER ENDOSCOPY     neg   KNEE ARTHROSCOPY     Left   ROTATOR CUFF REPAIR     R shoulder   TONSILLECTOMY     TRIGGER FINGER RELEASE Right    Right Thumb   Patient Active Problem List   Diagnosis Date Noted   Mitral regurgitation 11/10/2022   Shortness of breath 10/04/2022   Allergic rhinitis due to pollen 05/29/2022   Cervical spondylosis without myelopathy 05/29/2022   Hyperglycemia due to type 2 diabetes mellitus (HCC) 05/29/2022   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   Perennial allergic rhinitis 05/29/2022   Pure hypercholesterolemia 05/29/2022   Simple chronic conjunctivitis 05/29/2022   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   Partial thickness rotator cuff tear 03/17/2022   Dysuria 02/28/2022   CAD (coronary artery disease)    Myelodysplasia, 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   Spinal stenosis of lumbar region 07/30/2020  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   Bilateral knee pain 05/11/2020   Atrial flutter (HCC) 01/29/2020   A-fib (HCC) 01/27/2020   Atrial flutter, paroxysmal (HCC) 01/26/2020   Lumbar spondylosis 10/27/2019   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   Iron deficiency anemia due to chronic blood loss 07/18/2019   Closed fracture of proximal phalanx of great toe 09/02/2018   Nuclear sclerosis of both eyes 08/20/2017   High risk medication use 06/15/2017   Recurrent urinary tract infection 10/14/2015   Pain in the chest    Chest pain at rest 11/26/2014   Shingles 05/14/2014   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   Vaginal atrophy 10/16/2012   Menopause 10/16/2012   Chest pain, atypical 02/19/2012   Type 2 diabetes mellitus with neurological  manifestations, 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/19/2012   Migraine 01/09/2012   Interstitial cystitis (chronic) without hematuria 12/20/2011   Cervical stenosis of spinal canal 08/28/2011   Meningioma (HCC) 08/28/2011   COSTOCHONDRITIS 11/16/2010   DISTURBANCES OF SENSATION OF SMELL AND TASTE 04/05/2010   NEPHROLITHIASIS, HX OF 02/08/2010   EDEMA 10/01/2009   Brachial neuritis or radiculitis NOS 06/14/2009   EXTERNAL HEMORRHOIDS 05/25/2009   MUSCLE PAIN 05/17/2009   Hyperlipidemia 04/23/2009   ABNORMAL THYROID FUNCTION TESTS 04/23/2009   MENINGIOMA 12/26/2007   HEADACHE 12/26/2007   RADICULOPATHY 04/02/2007   Essential hypertension 11/21/2006   GERD 11/21/2006    PCP: Delma Officer, PA    REFERRING PROVIDER: refills  REFERRING DIAG: M54.2 (ICD-10-CM) - Cervicalgia S16.1XXA (ICD-10-CM) - Strain of muscle, fascia and tendon at neck level, initial encounter  THERAPY DIAG:  Cervicalgia  Cramp and spasm  Abnormal posture  Rationale for Evaluation and Treatment: Rehabilitation  ONSET DATE: chronic, worsened October 2023  SUBJECTIVE:                                                                                                                                                                                                         SUBJECTIVE STATEMENT: Went on cruise, was able to manage neck , performed some of exercises.  Overall about 90% better.  Was in ER last night until 4 am because husband was ill Hand dominance: Right  PERTINENT HISTORY:  Migraines, arthritis, L knee OA, anemia, DM, bil ankle pain, CAD, chronic Leukopenia, fibromyalgia, GERD, on Eliquis for Afib/AFlutter.   PAIN:  Are you having pain? Yes: NPRS scale: 2-4/10 Pain location: R side of  neck Pain description: dull ache Aggravating factors: turning neck Relieving factors: rest  PRECAUTIONS: None  RED FLAGS: None     WEIGHT BEARING RESTRICTIONS: No  FALLS:  Has patient fallen in last 6 months?  No  LIVING ENVIRONMENT: Lives with: lives with their spouse Lives in: House/apartment Stairs: Yes: Internal: 17 steps; on right going up and on left going up and External: 17 steps; on right going up and on left going up has chair lift Has following equipment at home: Single point cane, Walker - 2 wheeled, Tour manager, and Grab bars  OCCUPATION: retired  PLOF: Independent  PATIENT GOALS: get rid of or manage this neck pain as best I can  NEXT MD VISIT:  Jun 18, 2023  OBJECTIVE:   DIAGNOSTIC FINDINGS:  No recent available for review. CT cervical spine 08/04/2019 IMPRESSION: 1. No acute intracranial findings or skull fracture. 2. No acute facial bone fractures. 3. 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  PATIENT SURVEYS:  NDI 24/50= 48%   COGNITION: Overall cognitive status: Within functional limits for tasks assessed  SENSATION: WFL  POSTURE: rounded shoulders and forward head  PALPATION: Tenderness/tightness bil UT, levator scapulae, cervical paraspinals and suboccipitals.  Large lipoma Left scapula  CERVICAL ROM:   Active ROM A/PROM (deg) eval 06/28/23: 07/17/23  Flexion 40 70 70  Extension 40  60  Right lateral flexion 30p!    Left lateral flexion 30p!    Right rotation 50p! 65 60  Left rotation 50p! 75 70   (Blank rows = not tested)  UPPER EXTREMITY ROM:   Shoulder ROM WFL, symmetric and pain free, slightly limited reaching behind back to waist, reports this is normal for her.      UPPER EXTREMITY MMT: 5/5 all myotomes except 4/5 R shoulder ER, has history of R RTC tear.   CERVICAL SPECIAL TESTS:  Spurling's test: Negative  TODAY'S TREATMENT:                                                                                                                              DATE: 07/17/23: Reassessed cervical spine ROM, NDI, B UE strength: Manual:  supine for alternating bouts of  manual cervical distraction, PA and lateral glides C 2 to C 5 region Upper cervical rotation with contract / relax to improve ROM, primarily for R rotation Contract /relax combined with manual stretching for each side for levator, upper traps, and post upper cervical occipital musculature. Reviewed theraband B shoulder ER, rows, with yellow t band 15 reps each  06/27/23: Manual:  supine for alternating bouts of manual cervical distraction, PA and lateral glides C 2 to C 5 region Upper cervical rotation with contract / relax to improve ROM, primarily for R rotation Contract /relax combined with manual stretching for each side for levator, upper traps, and post upper cervical occipital musculature. Seated for post 1st rib depression with ipsilateral rotation, 10 reps, each side  Therex for periscapular strengthening and support for cervical spine,  Yellow t band B shoulder ER 15 reps, small amplitude Red t  band rows, band under feet, to engage middle traps musculature 15 reps Red t band scapular punches, 15 rpes  06/25/2023 Therapeutic Exercise: to improve strength and mobility.  Demo, verbal and tactile cues throughout for technique. UBE L1 x 5  min forward Manual Therapy: to decrease muscle spasm and pain and improve mobility STM/TPR to cervical paraspinals, R SCM, R Levator, UT UP mobs grade 2-3 and NAGs into rotation.  Attended Estim: TENS to R UT x 12 min with MHP while education throughout on purpose of TENS, pad placement, how to set up, care of electrodes, and use of TENS machine.     06/19/23:  Manual: supine for alternating bouts of manual cervical distraction, PA and lateral glides C 2 to C 5 region Upper cervical rotation with contract / relax to improve ROM Contract /relax combined with manual stretching for each side for levator, upper traps, and post upper cervical occipital musculature.  Therex.  Instructed/ reviewed snags for 1st rib depression with rotation to ipselateral  side, utilizing belt.   06/11/23  Therapeutic Exercise: to improve strength and mobility.  Demo, verbal and tactile cues throughout for technique. UBE L1 fwd/2 min back Nerve glides - tray for median nerve, and ulnar nerve glide Levator stretch SCM stretch SCM self STM Chin tucks Manual Therapy: to decrease muscle spasm and pain and improve mobility IASTM with s/s tools to R forearm - too tender, focused instead on STM/TPR to R forearm, joint mobs to distal radial/ulnar joint, bil UT, R SCM, R Levator, bil cervical paraspinals.     PATIENT EDUCATION:  Education details: HEP review Person educated: Patient Education method: Medical illustrator Education comprehension: verbalized understanding and returned demonstration  HOME EXERCISE PROGRAM: Access Code: [email or web address] URL: [email or web address] Date: 06/11/2023 Prepared by: Harrie Foreman  Exercises - Seated Shoulder Rolls  - 3 x daily - 7 x weekly - 1 sets - 10 reps - Seated Cervical Retraction  - 3 x daily - 7 x weekly - 1 sets - 10 reps - Seated Scapular Retraction  - 3 x daily - 7 x weekly - 1 sets - 10 reps - Gentle Levator Scapulae Stretch  - 3 x daily - 7 x weekly - 1 sets - 3 reps - 10-15 sec hold - Seated Assisted Cervical Rotation with Towel  - 1 x daily - 7 x weekly - 2 sets - 10 reps - 3 sec hold - Cervical Extension AROM with Strap  - 1 x daily - 7 x weekly - 2 sets - 10 reps - 3 sec hold - Sternocleidomastoid Stretch  - 1 x daily - 7 x weekly - 1 sets - 3 reps - 10-15 hold - Median Nerve Flossing - Tray  - 1 x daily - 7 x weekly - 1 sets - 10 reps - 5 sec  hold - Ulnar Nerve Flossing  - 1 x daily - 7 x weekly - 3 sets - 10 reps  ASSESSMENT:  CLINICAL IMPRESSION: Kristen Murray returned today for chronic cervical spine pain and stiffness.  Overall she has met her goals.  Still has residual stiffness cervical spine but pain improved and she has coping mechanisms for managing pain flares now.   Managed a long trip without significant difficulty.  Agreed to DC at this time as goals met.    OBJECTIVE IMPAIRMENTS: decreased activity tolerance, decreased mobility, decreased ROM, hypomobility, increased muscle spasms, impaired flexibility, postural dysfunction, and pain.   ACTIVITY LIMITATIONS: bending  PARTICIPATION LIMITATIONS:  meal prep, cleaning, laundry, driving, and shopping  PERSONAL FACTORS: Age, Time since onset of injury/illness/exacerbation, and 3+ comorbidities: Migraines, arthritis, L knee OA, anemia, DM, bil ankle pain, CAD, chronic Leukopenia, fibromyalgia, GERD, on Eliquis for Afib/AFlutter.   are also affecting patient's functional outcome.   REHAB POTENTIAL: Good  CLINICAL DECISION MAKING: Evolving/moderate complexity  EVALUATION COMPLEXITY: Moderate   GOALS: Goals reviewed with patient? Yes  SHORT TERM GOALS: Target date: 06/19/2023   Patient will be independent with initial HEP.  Baseline: needs Goal status: MET 06/11/23  LONG TERM GOALS: Target date: 07/17/2023   Patient will be independent with advanced/ongoing HEP to improve outcomes and carryover.  Baseline:  Goal status: MET  2.  Patient will report 75% improvement in neck pain to improve QOL.  Baseline:  Goal status: MET 07/17/23 90% improvement reported  3.  Patient will demonstrate at least 60 deg cervical rotation for safety with driving.  Baseline: see objective Goal status: IN PROGRESS, still restricted B cervical rotation 70 R, 60 L  4.  Patient will report 16/50 or less on NDI  to demonstrate improved functional ability.  Baseline: 24/50 Goal status: MET 13/50, goal met  PLAN:  PT FREQUENCY: 1-2x/week  PT DURATION: 6 weeks  PLANNED INTERVENTIONS: Therapeutic exercises, Therapeutic activity, Neuromuscular re-education, Balance training, Gait training, Patient/Family education, Self Care, Joint mobilization, Joint manipulation, Dry Needling, Electrical stimulation, Spinal  manipulation, Spinal mobilization, Cryotherapy, Moist heat, Taping, Traction, Ultrasound, Manual therapy, and Re-evaluation  PLAN FOR NEXT SESSION: DC at this time, goals met  Early Chars, PT, DPT [facility] 07/18/2023, 1:04 PM

## 2023-07-23 ENCOUNTER — Inpatient Hospital Stay: Payer: Medicare Other

## 2023-07-23 VITALS — BP 127/64 | HR 75 | Temp 97.9°F | Resp 19

## 2023-07-23 DIAGNOSIS — D500 Iron deficiency anemia secondary to blood loss (chronic): Secondary | ICD-10-CM

## 2023-07-23 DIAGNOSIS — D72819 Decreased white blood cell count, unspecified: Secondary | ICD-10-CM

## 2023-07-23 DIAGNOSIS — D46Z Other myelodysplastic syndromes: Secondary | ICD-10-CM

## 2023-07-23 DIAGNOSIS — D508 Other iron deficiency anemias: Secondary | ICD-10-CM | POA: Diagnosis not present

## 2023-07-23 MED ORDER — SODIUM CHLORIDE 0.9 % IV SOLN
200.0000 mg | Freq: Once | INTRAVENOUS | Status: AC
  Administered 2023-07-23: 200 mg via INTRAVENOUS
  Filled 2023-07-23: qty 200

## 2023-07-23 MED ORDER — SODIUM CHLORIDE 0.9 % IV SOLN: Freq: Once | INTRAVENOUS | Status: AC

## 2023-07-23 NOTE — Patient Instructions (Signed)
Iron Sucrose Injection What is this medication? IRON SUCROSE (EYE ern SOO krose) treats low levels of iron (iron deficiency anemia) in people with kidney disease. Iron is a mineral that plays an important role in making red blood cells, which carry oxygen from your lungs to the rest of your body. This medicine may be used for other purposes; ask your health care provider or pharmacist if you have questions. COMMON BRAND NAME(S): Venofer What should I tell my care team before I take this medication? They need to know if you have any of these conditions: Anemia not caused by low iron levels Heart disease High levels of iron in the blood Kidney disease Liver disease An unusual or allergic reaction to iron, other medications, foods, dyes, or preservatives Pregnant or trying to get pregnant Breastfeeding How should I use this medication? This medication is for infusion into a vein. It is given in a hospital or clinic setting. Talk to your care team about the use of this medication in children. While this medication may be prescribed for children as young as 2 years for selected conditions, precautions do apply. Overdosage: If you think you have taken too much of this medicine contact a poison control center or emergency room at once. NOTE: This medicine is only for you. Do not share this medicine with others. What if I miss a dose? Keep appointments for follow-up doses. It is important not to miss your dose. Call your care team if you are unable to keep an appointment. What may interact with this medication? Do not take this medication with any of the following: Deferoxamine Dimercaprol Other iron products This medication may also interact with the following: Chloramphenicol Deferasirox This list may not describe all possible interactions. Give your health care provider a list of all the medicines, herbs, non-prescription drugs, or dietary supplements you use. Also tell them if you smoke,  drink alcohol, or use illegal drugs. Some items may interact with your medicine. What should I watch for while using this medication? Visit your care team regularly. Tell your care team if your symptoms do not start to get better or if they get worse. You may need blood work done while you are taking this medication. You may need to follow a special diet. Talk to your care team. Foods that contain iron include: whole grains/cereals, dried fruits, beans, or peas, leafy green vegetables, and organ meats (liver, kidney). What side effects may I notice from receiving this medication? Side effects that you should report to your care team as soon as possible: Allergic reactions--skin rash, itching, hives, swelling of the face, lips, tongue, or throat Low blood pressure--dizziness, feeling faint or lightheaded, blurry vision Shortness of breath Side effects that usually do not require medical attention (report to your care team if they continue or are bothersome): Flushing Headache Joint pain Muscle pain Nausea Pain, redness, or irritation at injection site This list may not describe all possible side effects. Call your doctor for medical advice about side effects. You may report side effects to FDA at 1-800-FDA-1088. Where should I keep my medication? This medication is given in a hospital or clinic. It will not be stored at home. NOTE: This sheet is a summary. It may not cover all possible information. If you have questions about this medicine, talk to your doctor, pharmacist, or health care provider.  2024 Elsevier/Gold Standard (2023-03-23 00:00:00)

## 2023-07-24 ENCOUNTER — Other Ambulatory Visit: Payer: Self-pay | Admitting: Allergy and Immunology

## 2023-07-30 ENCOUNTER — Inpatient Hospital Stay: Payer: Medicare Other

## 2023-07-30 VITALS — BP 155/62 | HR 67 | Temp 98.0°F | Resp 18

## 2023-07-30 DIAGNOSIS — D500 Iron deficiency anemia secondary to blood loss (chronic): Secondary | ICD-10-CM

## 2023-07-30 DIAGNOSIS — D508 Other iron deficiency anemias: Secondary | ICD-10-CM | POA: Diagnosis not present

## 2023-07-30 DIAGNOSIS — D46Z Other myelodysplastic syndromes: Secondary | ICD-10-CM

## 2023-07-30 DIAGNOSIS — D72819 Decreased white blood cell count, unspecified: Secondary | ICD-10-CM

## 2023-07-30 MED ORDER — SODIUM CHLORIDE 0.9 % IV SOLN
200.0000 mg | Freq: Once | INTRAVENOUS | Status: AC
  Administered 2023-07-30: 200 mg via INTRAVENOUS
  Filled 2023-07-30: qty 200

## 2023-07-30 MED ORDER — SODIUM CHLORIDE 0.9 % IV SOLN: INTRAVENOUS | Status: DC

## 2023-07-30 NOTE — Patient Instructions (Signed)
Iron Sucrose Injection What is this medication? IRON SUCROSE (EYE ern SOO krose) treats low levels of iron (iron deficiency anemia) in people with kidney disease. Iron is a mineral that plays an important role in making red blood cells, which carry oxygen from your lungs to the rest of your body. This medicine may be used for other purposes; ask your health care provider or pharmacist if you have questions. COMMON BRAND NAME(S): Venofer What should I tell my care team before I take this medication? They need to know if you have any of these conditions: Anemia not caused by low iron levels Heart disease High levels of iron in the blood Kidney disease Liver disease An unusual or allergic reaction to iron, other medications, foods, dyes, or preservatives Pregnant or trying to get pregnant Breastfeeding How should I use this medication? This medication is for infusion into a vein. It is given in a hospital or clinic setting. Talk to your care team about the use of this medication in children. While this medication may be prescribed for children as young as 2 years for selected conditions, precautions do apply. Overdosage: If you think you have taken too much of this medicine contact a poison control center or emergency room at once. NOTE: This medicine is only for you. Do not share this medicine with others. What if I miss a dose? Keep appointments for follow-up doses. It is important not to miss your dose. Call your care team if you are unable to keep an appointment. What may interact with this medication? Do not take this medication with any of the following: Deferoxamine Dimercaprol Other iron products This medication may also interact with the following: Chloramphenicol Deferasirox This list may not describe all possible interactions. Give your health care provider a list of all the medicines, herbs, non-prescription drugs, or dietary supplements you use. Also tell them if you smoke,  drink alcohol, or use illegal drugs. Some items may interact with your medicine. What should I watch for while using this medication? Visit your care team regularly. Tell your care team if your symptoms do not start to get better or if they get worse. You may need blood work done while you are taking this medication. You may need to follow a special diet. Talk to your care team. Foods that contain iron include: whole grains/cereals, dried fruits, beans, or peas, leafy green vegetables, and organ meats (liver, kidney). What side effects may I notice from receiving this medication? Side effects that you should report to your care team as soon as possible: Allergic reactions--skin rash, itching, hives, swelling of the face, lips, tongue, or throat Low blood pressure--dizziness, feeling faint or lightheaded, blurry vision Shortness of breath Side effects that usually do not require medical attention (report to your care team if they continue or are bothersome): Flushing Headache Joint pain Muscle pain Nausea Pain, redness, or irritation at injection site This list may not describe all possible side effects. Call your doctor for medical advice about side effects. You may report side effects to FDA at 1-800-FDA-1088. Where should I keep my medication? This medication is given in a hospital or clinic. It will not be stored at home. NOTE: This sheet is a summary. It may not cover all possible information. If you have questions about this medicine, talk to your doctor, pharmacist, or health care provider.  2024 Elsevier/Gold Standard (2023-03-23 00:00:00)

## 2023-08-07 ENCOUNTER — Inpatient Hospital Stay: Payer: Medicare Other

## 2023-08-07 ENCOUNTER — Encounter: Payer: Self-pay | Admitting: Medical Oncology

## 2023-08-07 ENCOUNTER — Inpatient Hospital Stay (HOSPITAL_BASED_OUTPATIENT_CLINIC_OR_DEPARTMENT_OTHER): Payer: Medicare Other | Attending: Hematology & Oncology | Admitting: Medical Oncology

## 2023-08-07 ENCOUNTER — Inpatient Hospital Stay: Payer: Medicare Other | Attending: Hematology & Oncology

## 2023-08-07 ENCOUNTER — Other Ambulatory Visit: Payer: Self-pay

## 2023-08-07 VITALS — BP 148/69 | HR 68 | Temp 97.3°F | Resp 17 | Ht 64.5 in | Wt 167.0 lb

## 2023-08-07 DIAGNOSIS — D500 Iron deficiency anemia secondary to blood loss (chronic): Secondary | ICD-10-CM

## 2023-08-07 DIAGNOSIS — Z79899 Other long term (current) drug therapy: Secondary | ICD-10-CM | POA: Diagnosis not present

## 2023-08-07 DIAGNOSIS — D46Z Other myelodysplastic syndromes: Secondary | ICD-10-CM

## 2023-08-07 DIAGNOSIS — D709 Neutropenia, unspecified: Secondary | ICD-10-CM

## 2023-08-07 DIAGNOSIS — D508 Other iron deficiency anemias: Secondary | ICD-10-CM | POA: Diagnosis not present

## 2023-08-07 DIAGNOSIS — R599 Enlarged lymph nodes, unspecified: Secondary | ICD-10-CM | POA: Diagnosis not present

## 2023-08-07 DIAGNOSIS — D4620 Refractory anemia with excess of blasts, unspecified: Secondary | ICD-10-CM

## 2023-08-07 DIAGNOSIS — K909 Intestinal malabsorption, unspecified: Secondary | ICD-10-CM | POA: Diagnosis not present

## 2023-08-07 DIAGNOSIS — D72819 Decreased white blood cell count, unspecified: Secondary | ICD-10-CM

## 2023-08-07 DIAGNOSIS — R591 Generalized enlarged lymph nodes: Secondary | ICD-10-CM | POA: Diagnosis not present

## 2023-08-07 DIAGNOSIS — D509 Iron deficiency anemia, unspecified: Secondary | ICD-10-CM | POA: Diagnosis not present

## 2023-08-07 LAB — CBC WITH DIFFERENTIAL (CANCER CENTER ONLY)
Abs Immature Granulocytes: 0 10*3/uL (ref 0.00–0.07)
Basophils Absolute: 0 10*3/uL (ref 0.0–0.1)
Basophils Relative: 0 %
Eosinophils Absolute: 0 10*3/uL (ref 0.0–0.5)
Eosinophils Relative: 0 %
HCT: 32.7 % — ABNORMAL LOW (ref 36.0–46.0)
Hemoglobin: 10.4 g/dL — ABNORMAL LOW (ref 12.0–15.0)
Lymphocytes Relative: 67 %
Lymphs Abs: 0.9 10*3/uL (ref 0.7–4.0)
MCH: 26.8 pg (ref 26.0–34.0)
MCHC: 31.8 g/dL (ref 30.0–36.0)
MCV: 84.3 fL (ref 80.0–100.0)
Metamyelocytes Relative: 2 %
Monocytes Absolute: 0 10*3/uL — ABNORMAL LOW (ref 0.1–1.0)
Monocytes Relative: 2 %
Neutro Abs: 0.4 10*3/uL — CL (ref 1.7–7.7)
Neutrophils Relative %: 29 %
Platelet Count: 185 10*3/uL (ref 150–400)
RBC: 3.88 MIL/uL (ref 3.87–5.11)
RDW: 18.2 % — ABNORMAL HIGH (ref 11.5–15.5)
Smear Review: NORMAL
WBC Count: 1.3 10*3/uL — ABNORMAL LOW (ref 4.0–10.5)
nRBC: 0 % (ref 0.0–0.2)

## 2023-08-07 LAB — CMP (CANCER CENTER ONLY)
ALT: 61 U/L — ABNORMAL HIGH (ref 0–44)
AST: 52 U/L — ABNORMAL HIGH (ref 15–41)
Albumin: 3.5 g/dL (ref 3.5–5.0)
Alkaline Phosphatase: 99 U/L (ref 38–126)
Anion gap: 13 (ref 5–15)
BUN: 18 mg/dL (ref 8–23)
CO2: 23 mmol/L (ref 22–32)
Calcium: 9.9 mg/dL (ref 8.9–10.3)
Chloride: 99 mmol/L (ref 98–111)
Creatinine: 0.74 mg/dL (ref 0.44–1.00)
GFR, Estimated: >60 mL/min (ref >60)
Glucose, Bld: 311 mg/dL — ABNORMAL HIGH (ref 70–99)
Potassium: 4.1 mmol/L (ref 3.5–5.1)
Sodium: 135 mmol/L (ref 135–145)
Total Bilirubin: 0.4 mg/dL (ref 0.3–1.2)
Total Protein: 8.1 g/dL (ref 6.5–8.1)

## 2023-08-07 LAB — RETICULOCYTES
Immature Retic Fract: 15.4 % (ref 2.3–15.9)
RBC.: 3.76 MIL/uL — ABNORMAL LOW (ref 3.87–5.11)
Retic Count, Absolute: 79 10*3/uL (ref 19.0–186.0)
Retic Ct Pct: 2.1 % (ref 0.4–3.1)

## 2023-08-07 LAB — FERRITIN: Ferritin: 1247 ng/mL — ABNORMAL HIGH (ref 11–307)

## 2023-08-07 MED ORDER — DARBEPOETIN ALFA 300 MCG/0.6ML IJ SOSY
300.0000 ug | PREFILLED_SYRINGE | Freq: Once | INTRAMUSCULAR | Status: AC
  Administered 2023-08-07: 300 ug via SUBCUTANEOUS
  Filled 2023-08-07: qty 0.6

## 2023-08-07 NOTE — Patient Instructions (Signed)

## 2023-08-07 NOTE — Progress Notes (Signed)
Hematology and Oncology Follow Up Visit  Kristen Murray [id number] [birth year] 77 y.o. 08/07/2023   Principle Diagnosis:  Myelodysplasia -- low grade -- IPSS-R == 1.5 --  DNMT3A (+) Iron deficiency anemia-malabsorption   Current Therapy:        IV iron as indicated   Neulasta 6 mcg sq PRN prior to procedures Aranesp 300 mcg sq for Hgb < 11   Interim History:  Kristen Murray is here today for follow-up.   She has done well overall. She recently got back from a trip to [place]. She had a wonderful time.   She reports no illness since her last visit where she was fighting off a UTI.  No fever, chills, n/v, rash, dizziness, SOB, chest pain, palpitations, abdominal pain or changes in bowel or bladder habits.  Dry cough with allergies that comes and goes.  She does mention an area of tenderness and swelling of her right pubis  No swelling, tenderness, numbness or tingling in her extremities.  No falls or syncope.  Appetite and hydration are good.  Wt Readings from Last 3 Encounters:  08/07/23 167 lb (75.8 kg)  06/26/23 168 lb 6.4 oz (76.4 kg)  06/06/23 168 lb (76.2 kg)   ECOG Performance Status: 1 - Symptomatic but completely ambulatory  Medications:  Allergies as of 08/07/2023       Reactions   Codeine Hives, Other (See Comments)   Because of a history of documented adverse serious drug reaction, Medi Alert bracelet  is recommended   Macrodantin [nitrofurantoin Macrocrystal] Other (See Comments)   Numbness, aching all over   Other Shortness Of Breath, Rash, Other (See Comments)   Pine nuts & walnuts throat congestion. No documented angioedema.   Propoxyphene Hives, Swelling, Rash, Other (See Comments)   Propoxyphene Hcl Hives, Swelling, Rash   Dulaglutide Other (See Comments)   Other reaction(s): weak, ha, no appetite   Latex Rash, Other (See Comments)   Metformin Hcl Other (See Comments)   Other reaction(s): leg discomfort   Neosporin [bacitracin-polymyxin B] Other (See  Comments)   Blisters   Zoster Vac Recomb Adjuvanted Rash, Other (See Comments)   Other reaction(s): confusion   Augmentin [amoxicillin-pot Clavulanate] Itching   Avocado Rash   Banana Rash   Ciprofloxacin Nausea Only, Other (See Comments)   Nausea (Note: Patient takes this when on mission trips, however)   Tizanidine Other (See Comments)   Reaction not recalled   Tramadol Nausea Only        Medication List        Accurate as of August 07, 2023  2:47 PM. If you have any questions, ask your nurse or doctor.          acetaminophen 325 MG tablet Commonly known as: TYLENOL Take 325-650 mg by mouth every 6 (six) hours as needed (for pain).   ALPRAZolam 0.25 MG tablet Commonly known as: XANAX Take 0.25 mg by mouth 2 (two) times daily as needed.   azelastine 0.1 % nasal spray Commonly known as: ASTELIN Place into both nostrils 2 (two) times daily. Use in each nostril as directed   b complex vitamins capsule daily.   cetirizine 10 MG tablet Commonly known as: ZYRTEC Take 1 tablet (10 mg total) by mouth daily as needed for allergies (Can take an extra dose during flare ups.).   Cholecalciferol 25 MCG (1000 UT) capsule Take 1,000 Units by mouth daily.   clotrimazole-betamethasone cream Commonly known as: Lotrisone Apply 1 Application topically 2 (two) times daily.  Use for 2 weeks as needed.  Apply to area twice weekly at bedtime for maintenance dosing.   cyclobenzaprine 10 MG tablet Commonly known as: FLEXERIL Take 2.5 mg by mouth daily as needed for muscle spasms.   dextromethorphan-guaiFENesin 30-600 MG 12hr tablet Commonly known as: MUCINEX DM Take 1 tablet by mouth 2 (two) times daily.   diclofenac 0.1 % ophthalmic solution Commonly known as: VOLTAREN 4 (four) times daily as needed.   DropSafe Alcohol Prep 70 % Pads Apply topically.   Eliquis 5 MG Tabs tablet Generic drug: apixaban TAKE 1 TABLET BY MOUTH TWICE  DAILY   EPINEPHrine 0.3 mg/0.3 mL Soaj  injection Commonly known as: EPI-PEN Inject 0.3 mg into the muscle as needed for anaphylaxis.   fluorometholone 0.1 % ophthalmic suspension Commonly known as: FML Place 1 drop into both eyes every Monday, Wednesday, and Friday.   fluticasone 50 MCG/ACT nasal spray Commonly known as: FLONASE Place 1 spray into both nostrils 2 (two) times daily.   folic acid 400 MCG tablet Commonly known as: FOLVITE daily.   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   GaviLyte-G 236 g solution Generic drug: polyethylene glycol   glipiZIDE 10 MG 24 hr tablet Commonly known as: GLUCOTROL XL Take 15 mg by mouth daily. Takes 15 mg   glucose blood test strip Inject 1 strip into the skin daily.   glucose blood test strip Commonly known as: FREESTYLE LITE CHECK BLOOD SUGAR ONCE DAILY AS DIRECTED.  DX:790.29   ipratropium 0.06 % nasal spray Commonly known as: ATROVENT Place 2 sprays into both nostrils 3 (three) times daily.   lidocaine 5 % ointment Commonly known as: XYLOCAINE Apply 1 application topically 3 (three) times daily as needed.   loratadine 10 MG tablet Commonly known as: CLARITIN Take 1 tablet (10 mg total) by mouth daily.   metoprolol tartrate 50 MG tablet Commonly known as: LOPRESSOR TAKE 1 AND 1/2 TABLETS TWICE DAILY   montelukast 10 MG tablet Commonly known as: SINGULAIR TAKE 1 TABLET BY MOUTH AT  BEDTIME   multivitamin tablet Take 1 tablet by mouth daily. Shaklee brand   nitroGLYCERIN 0.4 MG SL tablet Commonly known as: NITROSTAT Place 1 tablet (0.4 mg total) under the tongue every 5 (five) minutes as needed for chest pain.   pantoprazole 40 MG tablet Commonly known as: PROTONIX Take 1 tablet (40 mg total) by mouth 2 (two) times daily.   rosuvastatin 5 MG tablet Commonly known as: CRESTOR Take 5 mg by mouth See admin  instructions. Take 5 mg by mouth at bedtime on Mondays and Thursdays..Currently on hold   traMADol 50 MG tablet Commonly known as: ULTRAM Take 1 tablet 4 times a day by oral route as needed.   tretinoin 0.025 % cream Commonly known as: RETIN-A Apply 1 application. topically at bedtime.   trolamine salicylate 10 % cream Commonly known as: ASPERCREME Apply 1 application topically as needed for muscle pain.   valACYclovir 500 MG tablet Commonly known as: VALTREX Take 1 tablet (500 mg total) by mouth 2 (two) times daily. Take twice a day for thee days as needed for an outbreak.   verapamil 180 MG CR tablet Commonly known as: CALAN-SR Take 180 mg by mouth at bedtime.        Allergies:  Allergies  Allergen Reactions   Codeine Hives and Other (See Comments)  Because of a history of documented adverse serious drug reaction, Medi Alert bracelet  is recommended   Macrodantin [Nitrofurantoin Macrocrystal] Other (See Comments)    Numbness, aching all over   Other Shortness Of Breath, Rash and Other (See Comments)    Pine nuts & walnuts throat congestion. No documented angioedema.    Propoxyphene Hives, Swelling, Rash and Other (See Comments)   Propoxyphene Hcl Hives, Swelling and Rash   Dulaglutide Other (See Comments)    Other reaction(s): weak, ha, no appetite   Latex Rash and Other (See Comments)   Metformin Hcl Other (See Comments)    Other reaction(s): leg discomfort   Neosporin [Bacitracin-Polymyxin B] Other (See Comments)    Blisters   Zoster Vac Recomb Adjuvanted Rash and Other (See Comments)    Other reaction(s): confusion   Augmentin [Amoxicillin-Pot Clavulanate] Itching   Avocado Rash   Banana Rash   Ciprofloxacin Nausea Only and Other (See Comments)    Nausea (Note: Patient takes this when on mission trips, however)   Tizanidine Other (See Comments)    Reaction not recalled   Tramadol Nausea Only    Past Medical History, Surgical history, Social history, and  Family History were reviewed and updated.  Review of Systems: All other 10 point review of systems is negative.   Physical Exam:  height is 5' 4.5" (1.638 m) and weight is 167 lb (75.8 kg). Her oral temperature is 97.3 F (36.3 C) (abnormal). Her blood pressure is 148/69 (abnormal) and her pulse is 68. Her respiration is 17 and oxygen saturation is 100%.   Wt Readings from Last 3 Encounters:  08/07/23 167 lb (75.8 kg)  06/26/23 168 lb 6.4 oz (76.4 kg)  06/06/23 168 lb (76.2 kg)    Ocular: Sclerae unicteric, pupils equal, round and reactive to light Ear-nose-throat: Oropharynx clear, dentition fair Lymphatic: No cervical or supraclavicular adenopathy. Mild possible bilateral axillary adenopathy. There is a firm enlarged lymph node of the right inguinal area and pubis. No erythema or discharge  Lungs no rales or rhonchi, good excursion bilaterally Heart regular rate and rhythm, no murmur appreciated Abd soft, nontender, positive bowel sounds MSK no focal spinal tenderness, no joint edema Neuro: non-focal, well-oriented, appropriate affect   Lab Results  Component Value Date   WBC 1.3 (L) 08/07/2023   HGB 10.4 (L) 08/07/2023   HCT 32.7 (L) 08/07/2023   MCV 84.3 08/07/2023   PLT 185 08/07/2023   Lab Results  Component Value Date   FERRITIN 640 (H) 06/26/2023   IRON 21 (L) 06/26/2023   TIBC 209 (L) 06/26/2023   UIBC 188 06/26/2023   IRONPCTSAT 10 (L) 06/26/2023   Lab Results  Component Value Date   RETICCTPCT 2.1 08/07/2023   RBC 3.88 08/07/2023   No results found for: "KPAFRELGTCHN", "LAMBDASER", "KAPLAMBRATIO" No results found for: "IGGSERUM", "IGA", "IGMSERUM" No results found for: "TOTALPROTELP", "ALBUMINELP", "A1GS", "A2GS", "BETS", "BETA2SER", "GAMS", "MSPIKE", "SPEI"   Chemistry      Component Value Date/Time   NA 138 06/26/2023 1252   NA 138 03/10/2020 1138   K 4.3 06/26/2023 1252   CL 102 06/26/2023 1252   CO2 31 06/26/2023 1252   BUN 16 06/26/2023 1252    BUN 10 03/10/2020 1138   CREATININE 0.81 06/26/2023 1252      Component Value Date/Time   CALCIUM 9.7 06/26/2023 1252   ALKPHOS 72 06/26/2023 1252   AST 15 06/26/2023 1252   ALT 13 06/26/2023 1252   BILITOT 0.2 (L) 06/26/2023 1252  Encounter Diagnoses  Name Primary?   Enlarged lymph nodes Yes   Neutropenia, unspecified type (HCC)    Chronic leukopenia    Myelodysplasia, low grade (HCC)     Impression and Plan: Kristen Murray is a very pleasant 77 yo African American female with low-grade MDS, low IPSS-R score.  ESA needed, Hgb 10.3 WBC has fallen from 2.3 to 1.3- no known infection. Has had multiple recent ABX. New lymphadenopathy. Neutropenic precautions discussed. We will recheck labs next week- if not significantly improved I would recommend imaging and a BMB.  Iron studies are pending.    Kristen Chestnut, PA-C 10/8/20242:47 PM

## 2023-08-08 LAB — IRON AND IRON BINDING CAPACITY (CC-WL,HP ONLY)
Iron: 43 ug/dL (ref 28–170)
Saturation Ratios: 18 % (ref 10.4–31.8)
TIBC: 246 ug/dL — ABNORMAL LOW (ref 250–450)
UIBC: 203 ug/dL (ref 148–442)

## 2023-08-09 ENCOUNTER — Other Ambulatory Visit: Payer: Self-pay

## 2023-08-09 ENCOUNTER — Ambulatory Visit: Payer: Medicare Other | Attending: Sports Medicine

## 2023-08-09 DIAGNOSIS — R262 Difficulty in walking, not elsewhere classified: Secondary | ICD-10-CM

## 2023-08-09 DIAGNOSIS — M25571 Pain in right ankle and joints of right foot: Secondary | ICD-10-CM

## 2023-08-09 DIAGNOSIS — M25572 Pain in left ankle and joints of left foot: Secondary | ICD-10-CM | POA: Diagnosis present

## 2023-08-09 NOTE — Therapy (Signed)
OUTPATIENT PHYSICAL THERAPY LOWER EXTREMITY EVALUATION   Patient Name: Kristen Murray MRN: [id number] DOB:07-22-1946, 77 y.o., female Today's Date: 08/09/2023  END OF SESSION:  PT End of Session - 08/09/23 1649     Visit Number 1    Date for PT Re-Evaluation 09/20/23    Authorization Type [employer] Medicare    Progress Note Due on Visit 10    PT Start Time 1400    PT Stop Time 1445    PT Time Calculation (min) 45 min    Activity Tolerance Patient tolerated treatment well    Behavior During Therapy WFL for tasks assessed/performed             Past Medical History:  Diagnosis Date   Arthritis    Borderline abnormal TFTs    as a teen   CAD (coronary artery disease)    Coronary CTA 3/22: Calcium score 1 (38th percentile), minimal nonobstructive CAD with 0-24% in mid RCA; aortic atherosclerosis   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   Nephrolithiasis    Dr Logan Bores, [facility], Hx of [3][   Polyp of colon    Radiculopathy    Past Surgical History:  Procedure Laterality Date   ABDOMINAL HYSTERECTOMY     TAH/BSO --fibroids, no cancer   BALLOON DILATION N/A 07/20/2014   Procedure: BALLOON DILATION;  Surgeon: Charolett Bumpers, MD;  Location: [facility];  Service: Endoscopy;  Laterality: N/A;   BREAST BIOPSY     Right   CARPAL TUNNEL RELEASE     & trigger thumb RUE; Dr Chaney Malling    COLONOSCOPY W/ POLYPECTOMY  07/2004   Neg 2011; Dr Danise Edge   CYSTOSCOPY  11/2009   Neg   ESOPHAGOGASTRODUODENOSCOPY N/A 07/20/2014   Procedure: ESOPHAGOGASTRODUODENOSCOPY (EGD);  Surgeon: Charolett Bumpers, MD;  Location: [facility];  Service: Endoscopy;  Laterality: N/A;   FLEXIBLE BRONCHOSCOPY W/ UPPER ENDOSCOPY     neg   KNEE ARTHROSCOPY     Left   ROTATOR CUFF REPAIR     R shoulder   TONSILLECTOMY     TRIGGER FINGER RELEASE Right    Right Thumb   Patient Active Problem List   Diagnosis Date Noted   Mitral regurgitation 11/10/2022   Shortness of breath 10/04/2022   Allergic rhinitis due to pollen 05/29/2022   Cervical spondylosis without myelopathy 05/29/2022   Hyperglycemia due to type 2 diabetes mellitus (HCC) 05/29/2022   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   Perennial allergic rhinitis 05/29/2022   Pure hypercholesterolemia 05/29/2022   Simple chronic conjunctivitis 05/29/2022   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   Partial thickness rotator cuff tear 03/17/2022   Dysuria 02/28/2022   CAD (coronary artery disease)    Myelodysplasia, 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   Spinal stenosis of lumbar region 07/30/2020   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  Bilateral knee pain 05/11/2020   Atrial flutter (HCC) 01/29/2020   A-fib (HCC) 01/27/2020   Atrial flutter, paroxysmal (HCC) 01/26/2020   Lumbar spondylosis 10/27/2019   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   Iron deficiency anemia due to chronic blood loss 07/18/2019   Closed fracture of proximal phalanx of great toe 09/02/2018   Nuclear sclerosis of both eyes 08/20/2017   High risk medication use 06/15/2017   Recurrent urinary tract infection 10/14/2015   Pain in the chest    Chest pain at rest 11/26/2014   Shingles 05/14/2014   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   Vaginal atrophy 10/16/2012   Menopause 10/16/2012   Chest pain, atypical 02/19/2012   Type 2 diabetes mellitus with neurological manifestations,  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/19/2012   Migraine 01/09/2012   Interstitial cystitis (chronic) without hematuria 12/20/2011   Cervical stenosis of spinal canal 08/28/2011   Meningioma (HCC) 08/28/2011   COSTOCHONDRITIS 11/16/2010   DISTURBANCES OF SENSATION OF SMELL AND TASTE 04/05/2010   NEPHROLITHIASIS, HX OF 02/08/2010   EDEMA 10/01/2009   Brachial neuritis or radiculitis NOS 06/14/2009   EXTERNAL HEMORRHOIDS 05/25/2009   MUSCLE PAIN 05/17/2009   Hyperlipidemia 04/23/2009   ABNORMAL THYROID FUNCTION TESTS 04/23/2009   MENINGIOMA 12/26/2007   HEADACHE 12/26/2007   RADICULOPATHY 04/02/2007   Essential hypertension 11/21/2006   GERD 11/21/2006    PCP: Eliane Decree, PA  REFERRING PROVIDER: Rodolph Bong  REFERRING DIAG: B ankle pain  THERAPY DIAG:  Difficulty in walking, not elsewhere classified  Pain in right ankle and joints of right foot  Pain in left ankle and joints of left foot  Rationale for Evaluation and Treatment: Rehabilitation  ONSET DATE: 2 weeks ago 07/29/23  SUBJECTIVE:   SUBJECTIVE STATEMENT: I was on my feet for several long days trying to give my husband a surprise birthday party, and my feet/ ankles started swelling, hurting, I got to where I stayed on the couch, could hardy walk.   PERTINENT HISTORY: 2 week h/o  PAIN:  Are you having pain? Yes: NPRS scale: 2 to 6/10 Pain location: ankles B lateral Pain description: tender, swollen, stiff Aggravating factors: gait  Relieving factors: resting, meds, steroids  PRECAUTIONS: osteopenia  RED FLAGS: None   WEIGHT BEARING RESTRICTIONS: No  FALLS:  Has patient fallen in last 6 months? No  LIVING ENVIRONMENT: Lives with: lives with their spouse Lives in: House/apartment Stairs: Yes: External: 3 steps; on left going up Has following equipment at home: Single point cane  OCCUPATION: retired   PLOF: Independent  PATIENT GOALS: get rid of foot pain so I can walk  NEXT MD VISIT: in 1  month  OBJECTIVE: Note: Objective measures were completed at Evaluation unless otherwise noted.  DIAGNOSTIC FINDINGS: not available  PATIENT SURVEYS:  LEFS 18/80  COGNITION: Overall cognitive status: Within functional limits for tasks assessed     SENSATION: WFL  EDEMA: mod edema B lateral malleoli   POSTURE:  varum B knees, weight bearing B forefeet  PALPATION: Point tender, exquisitely tender lateral R ankle over talocrual lig and lat border R 5th MT L ankle pt tender peroneal tendons inf to malleolus  LOWER EXTREMITY ROM:  Active ROM Right eval Left eval  Hip flexion    Hip extension    Hip abduction    Hip adduction    Hip internal rotation    Hip external rotation    Knee flexion    Knee extension    Ankle dorsiflexion 0 -18  Ankle plantarflexion 50 40  Ankle inversion 30 30  Ankle eversion nt nt   (Blank rows = not tested)  LOWER EXTREMITY MMT:  MMT Right eval Left eval  Hip flexion    Hip extension    Hip abduction    Hip adduction    Hip internal rotation    Hip external rotation    Knee flexion    Knee extension    Ankle dorsiflexion    Ankle plantarflexion    Ankle inversion    Ankle eversion     (Blank rows = not tested)  FUNCTIONAL TESTS:  Gait speed, 12 sec in 10 m, or 0.61m/sec , with st cane  GAIT: Distance walked: 31' in clinic Assistive device utilized: Single point cane Level of assistance: Modified independence Comments: slowed, avoids heel strike and toe off, foot flat contact, decreased step length B   TODAY'S TREATMENT:                                                                                                                              DATE: 08/09/23: eval, gentle calcaneal glides ant and post, gr 2 , each ankle to tolerance Prone for kinesiotaping each ankle, from MT heads to med and lat gastrocs, 2 I pieces each , crossing at achilles tendon\  Seated red theraband ankle pumps, guided to stay in pain free range  each leg, in open chain position    PATIENT EDUCATION:  Education details: POC, goals Person educated: Patient Education method: Explanation, Demonstration, and Tactile cues Education comprehension: verbalized understanding, returned demonstration, and verbal cues required  HOME EXERCISE PROGRAM: Theraband ankle pumps  ASSESSMENT:  CLINICAL IMPRESSION: Patient is a 77 y.o. female who participated in skilled  physical therapy evaluation due to sudden onset B ankle pain.  She developed pain and swelling B ankles after prolonged standing walking for several days, which the patient reports is more than her usual activity level.  She does have high tissue irritability B lateral ankle jts as well as decreased flexibility B.  Her gait efficiency and safety is affected.  Today utilized very gentle techniques to reduce her tissue irritability, inflammation to which she responded well.  She should benefit from short bout of skilled PT to regain her function B ankles .  OBJECTIVE IMPAIRMENTS: decreased activity tolerance, decreased balance, difficulty walking, decreased ROM, decreased strength, hypomobility, increased edema, and pain.   ACTIVITY LIMITATIONS: standing, squatting, stairs, and locomotion level  PARTICIPATION LIMITATIONS: meal prep, cleaning, shopping, community activity, and church  PERSONAL FACTORS: Behavior pattern, Past/current experiences, and Time since onset of injury/illness/exacerbation are also affecting patient's functional outcome.   REHAB POTENTIAL: Good  CLINICAL DECISION MAKING: Stable/uncomplicated  EVALUATION COMPLEXITY: Low   GOALS: Goals reviewed with patient? Yes  SHORT TERM GOALS: Target date: 2 weeks, 08/23/23 I HEP Baseline: Goal status: INITIAL    LONG TERM GOALS: Target date: 09/20/23, 6 weeks Improve AROM B ankles to [facility]  Baseline: current dorsiflexion L -18, R  0 Plantarflexion L 40 R 50 Goal status: INITIAL  2.  Improve LEFS to  40/80 Baseline: 18/80 Goal status: INITIAL  3.  Gait speed increase to greater than 1 M/sec for community ambulation Baseline:  Goal status: INITIAL   PLAN:  PT FREQUENCY: 2x/week  PT DURATION: 6 weeks  PLANNED INTERVENTIONS: 97146- PT Re-evaluation, 97110-Therapeutic exercises, 97530- Therapeutic activity, 97112- Neuromuscular re-education, 97535- Self Care, 40981- Manual therapy, and Patient/Family education  PLAN FOR NEXT SESSION: assess response to initial Rx  tape,jt mobs, light t band ankle PF DF . Utilize additional therex, manual techniques, modalities as needed   Danny Yackley L Vashti Bolanos, PT, DPT , OCS 08/09/2023, 5:11 PM

## 2023-08-13 ENCOUNTER — Inpatient Hospital Stay: Payer: Medicare Other

## 2023-08-13 ENCOUNTER — Encounter: Payer: Self-pay | Admitting: Hematology & Oncology

## 2023-08-13 ENCOUNTER — Inpatient Hospital Stay (HOSPITAL_BASED_OUTPATIENT_CLINIC_OR_DEPARTMENT_OTHER): Payer: Medicare Other | Admitting: Hematology & Oncology

## 2023-08-13 ENCOUNTER — Other Ambulatory Visit: Payer: Self-pay | Admitting: Surgery

## 2023-08-13 VITALS — BP 145/54 | HR 79 | Temp 98.6°F | Resp 18 | Wt 165.0 lb

## 2023-08-13 DIAGNOSIS — R590 Localized enlarged lymph nodes: Secondary | ICD-10-CM

## 2023-08-13 DIAGNOSIS — D46Z Other myelodysplastic syndromes: Secondary | ICD-10-CM

## 2023-08-13 DIAGNOSIS — R1909 Other intra-abdominal and pelvic swelling, mass and lump: Secondary | ICD-10-CM

## 2023-08-13 DIAGNOSIS — D4620 Refractory anemia with excess of blasts, unspecified: Secondary | ICD-10-CM | POA: Diagnosis not present

## 2023-08-13 DIAGNOSIS — D709 Neutropenia, unspecified: Secondary | ICD-10-CM

## 2023-08-13 LAB — CBC WITH DIFFERENTIAL (CANCER CENTER ONLY)
Abs Immature Granulocytes: 0 10*3/uL (ref 0.00–0.07)
Basophils Absolute: 0 10*3/uL (ref 0.0–0.1)
Basophils Relative: 0 %
Eosinophils Absolute: 0 10*3/uL (ref 0.0–0.5)
Eosinophils Relative: 0 %
HCT: 37 % (ref 36.0–46.0)
Hemoglobin: 11.8 g/dL — ABNORMAL LOW (ref 12.0–15.0)
Immature Granulocytes: 0 %
Lymphocytes Relative: 65 %
Lymphs Abs: 2 10*3/uL (ref 0.7–4.0)
MCH: 27.8 pg (ref 26.0–34.0)
MCHC: 31.9 g/dL (ref 30.0–36.0)
MCV: 87.3 fL (ref 80.0–100.0)
Monocytes Absolute: 0.6 10*3/uL (ref 0.1–1.0)
Monocytes Relative: 19 %
Neutro Abs: 0.5 10*3/uL — ABNORMAL LOW (ref 1.7–7.7)
Neutrophils Relative %: 16 %
Platelet Count: 145 10*3/uL — ABNORMAL LOW (ref 150–400)
RBC: 4.24 MIL/uL (ref 3.87–5.11)
RDW: 19.9 % — ABNORMAL HIGH (ref 11.5–15.5)
WBC Count: 3 10*3/uL — ABNORMAL LOW (ref 4.0–10.5)
nRBC: 0 % (ref 0.0–0.2)

## 2023-08-13 LAB — CMP (CANCER CENTER ONLY)
ALT: 33 U/L (ref 0–44)
AST: 10 U/L — ABNORMAL LOW (ref 15–41)
Albumin: 3.5 g/dL (ref 3.5–5.0)
Alkaline Phosphatase: 93 U/L (ref 38–126)
Anion gap: 7 (ref 5–15)
BUN: 16 mg/dL (ref 8–23)
CO2: 27 mmol/L (ref 22–32)
Calcium: 9.4 mg/dL (ref 8.9–10.3)
Chloride: 103 mmol/L (ref 98–111)
Creatinine: 0.73 mg/dL (ref 0.44–1.00)
GFR, Estimated: >60 mL/min (ref >60)
Glucose, Bld: 221 mg/dL — ABNORMAL HIGH (ref 70–99)
Potassium: 3.8 mmol/L (ref 3.5–5.1)
Sodium: 137 mmol/L (ref 135–145)
Total Bilirubin: 0.3 mg/dL (ref 0.3–1.2)
Total Protein: 7 g/dL (ref 6.5–8.1)

## 2023-08-13 LAB — SAVE SMEAR(SSMR), FOR PROVIDER SLIDE REVIEW

## 2023-08-13 NOTE — Progress Notes (Signed)
Hematology and Oncology Follow Up Visit  Kristen Murray [id number] 1946-10-12 77 y.o. 08/13/2023   Principle Diagnosis:  Myelodysplasia -- low grade -- IPSS-R == 1.5 --  DNMT3A (+) Iron deficiency anemia-malabsorption   Current Therapy:        IV iron as indicated   Neulasta 6 mcg sq PRN prior to procedures Aranesp 300 mcg sq for Hgb < 11   Interim History:  [name] is here today for follow-up.   She has done well overall. She recently got back from a trip to [place]. She had a wonderful time.   She reports no illness since her last visit where she was fighting off a UTI.  No fever, chills, n/v, rash, dizziness, SOB, chest pain, palpitations, abdominal pain or changes in bowel or bladder habits.  Dry cough with allergies that comes and goes.  She does mention an area of tenderness and swelling of her right pubis  No swelling, tenderness, numbness or tingling in her extremities.  No falls or syncope.  Appetite and hydration are good.  Wt Readings from Last 3 Encounters:  08/13/23 165 lb (74.8 kg)  08/07/23 167 lb (75.8 kg)  06/26/23 168 lb 6.4 oz (76.4 kg)   ECOG Performance Status: 1 - Symptomatic but completely ambulatory  Medications:  Allergies as of 08/13/2023       Reactions   Codeine Hives, Other (See Comments)   Because of a history of documented adverse serious drug reaction, Medi Alert bracelet  is recommended   Macrodantin [nitrofurantoin Macrocrystal] Other (See Comments)   Numbness, aching all over   Other Shortness Of Breath, Rash, Other (See Comments)   Pine nuts & walnuts throat congestion. No documented angioedema.   Propoxyphene Hives, Swelling, Rash, Other (See Comments)   Propoxyphene Hcl Hives, Swelling, Rash   Dulaglutide Other (See Comments)   Other reaction(s): weak, ha, no appetite   Latex Rash, Other (See Comments)   Metformin Hcl Other (See Comments)   Other reaction(s): leg discomfort   Neosporin [bacitracin-polymyxin B] Other (See  Comments)   Blisters   Zoster Vac Recomb Adjuvanted Rash, Other (See Comments)   Other reaction(s): confusion   Augmentin [amoxicillin-pot Clavulanate] Itching   Avocado Rash   Banana Rash   Ciprofloxacin Nausea Only, Other (See Comments)   Nausea (Note: Patient takes this when on mission trips, however)   Tizanidine Other (See Comments)   Reaction not recalled   Tramadol Nausea Only        Medication List        Accurate as of August 13, 2023  4:34 PM. If you have any questions, ask your nurse or doctor.          acetaminophen 325 MG tablet Commonly known as: TYLENOL Take 325-650 mg by mouth every 6 (six) hours as needed (for pain).   ALPRAZolam 0.25 MG tablet Commonly known as: XANAX Take 0.25 mg by mouth 2 (two) times daily as needed.   azelastine 0.1 % nasal spray Commonly known as: ASTELIN Place into both nostrils 2 (two) times daily. Use in each nostril as directed   b complex vitamins capsule daily.   cetirizine 10 MG tablet Commonly known as: ZYRTEC Take 1 tablet (10 mg total) by mouth daily as needed for allergies (Can take an extra dose during flare ups.).   Cholecalciferol 25 MCG (1000 UT) capsule Take 1,000 Units by mouth daily.   clotrimazole-betamethasone cream Commonly known as: Lotrisone Apply 1 Application topically 2 (two) times daily.  Use for 2 weeks as needed.  Apply to area twice weekly at bedtime for maintenance dosing.   cyclobenzaprine 10 MG tablet Commonly known as: FLEXERIL Take 2.5 mg by mouth daily as needed for muscle spasms.   dextromethorphan-guaiFENesin 30-600 MG 12hr tablet Commonly known as: MUCINEX DM Take 1 tablet by mouth 2 (two) times daily.   diclofenac 0.1 % ophthalmic solution Commonly known as: VOLTAREN 4 (four) times daily as needed.   DropSafe Alcohol Prep 70 % Pads Apply topically.   Eliquis 5 MG Tabs tablet Generic drug: apixaban TAKE 1 TABLET BY MOUTH TWICE  DAILY   EPINEPHrine 0.3 mg/0.3 mL Soaj  injection Commonly known as: EPI-PEN Inject 0.3 mg into the muscle as needed for anaphylaxis.   fluorometholone 0.1 % ophthalmic suspension Commonly known as: FML Place 1 drop into both eyes every Monday, Wednesday, and Friday.   fluticasone 50 MCG/ACT nasal spray Commonly known as: FLONASE Place 1 spray into both nostrils 2 (two) times daily.   folic acid 400 MCG tablet Commonly known as: FOLVITE daily.   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   GaviLyte-G 236 g solution Generic drug: polyethylene glycol   glipiZIDE 10 MG 24 hr tablet Commonly known as: GLUCOTROL XL Take 15 mg by mouth daily. Takes 15 mg   glucose blood test strip Inject 1 strip into the skin daily.   glucose blood test strip Commonly known as: FREESTYLE LITE CHECK BLOOD SUGAR ONCE DAILY AS DIRECTED.  DX:790.29   ipratropium 0.06 % nasal spray Commonly known as: ATROVENT Place 2 sprays into both nostrils 3 (three) times daily.   lidocaine 5 % ointment Commonly known as: XYLOCAINE Apply 1 application topically 3 (three) times daily as needed.   loratadine 10 MG tablet Commonly known as: CLARITIN Take 1 tablet (10 mg total) by mouth daily.   metoprolol tartrate 50 MG tablet Commonly known as: LOPRESSOR TAKE 1 AND 1/2 TABLETS TWICE DAILY   montelukast 10 MG tablet Commonly known as: SINGULAIR TAKE 1 TABLET BY MOUTH AT  BEDTIME   multivitamin tablet Take 1 tablet by mouth daily. Shaklee brand   nitroGLYCERIN 0.4 MG SL tablet Commonly known as: NITROSTAT Place 1 tablet (0.4 mg total) under the tongue every 5 (five) minutes as needed for chest pain.   pantoprazole 40 MG tablet Commonly known as: PROTONIX Take 1 tablet (40 mg total) by mouth 2 (two) times daily.   rosuvastatin 5 MG tablet Commonly known as: CRESTOR Take 5 mg by mouth See admin  instructions. Take 5 mg by mouth at bedtime on Mondays and Thursdays..Currently on hold   traMADol 50 MG tablet Commonly known as: ULTRAM Take 1 tablet 4 times a day by oral route as needed.   tretinoin 0.025 % cream Commonly known as: RETIN-A Apply 1 application. topically at bedtime.   trolamine salicylate 10 % cream Commonly known as: ASPERCREME Apply 1 application topically as needed for muscle pain.   valACYclovir 500 MG tablet Commonly known as: VALTREX Take 1 tablet (500 mg total) by mouth 2 (two) times daily. Take twice a day for thee days as needed for an outbreak.   verapamil 180 MG CR tablet Commonly known as: CALAN-SR Take 180 mg by mouth at bedtime.        Allergies:  Allergies  Allergen Reactions   Codeine Hives and Other (See Comments)  Because of a history of documented adverse serious drug reaction, Medi Alert bracelet  is recommended   Macrodantin [Nitrofurantoin Macrocrystal] Other (See Comments)    Numbness, aching all over   Other Shortness Of Breath, Rash and Other (See Comments)    Pine nuts & walnuts throat congestion. No documented angioedema.    Propoxyphene Hives, Swelling, Rash and Other (See Comments)   Propoxyphene Hcl Hives, Swelling and Rash   Dulaglutide Other (See Comments)    Other reaction(s): weak, ha, no appetite   Latex Rash and Other (See Comments)   Metformin Hcl Other (See Comments)    Other reaction(s): leg discomfort   Neosporin [Bacitracin-Polymyxin B] Other (See Comments)    Blisters   Zoster Vac Recomb Adjuvanted Rash and Other (See Comments)    Other reaction(s): confusion   Augmentin [Amoxicillin-Pot Clavulanate] Itching   Avocado Rash   Banana Rash   Ciprofloxacin Nausea Only and Other (See Comments)    Nausea (Note: Patient takes this when on mission trips, however)   Tizanidine Other (See Comments)    Reaction not recalled   Tramadol Nausea Only    Past Medical History, Surgical history, Social history, and  Family History were reviewed and updated.  Review of Systems: All other 10 point review of systems is negative.   Physical Exam:  weight is 165 lb (74.8 kg). Her oral temperature is 98.6 F (37 C). Her blood pressure is 145/54 (abnormal) and her pulse is 79. Her respiration is 18 and oxygen saturation is 97%.   Wt Readings from Last 3 Encounters:  08/13/23 165 lb (74.8 kg)  08/07/23 167 lb (75.8 kg)  06/26/23 168 lb 6.4 oz (76.4 kg)    Ocular: Sclerae unicteric, pupils equal, round and reactive to light Ear-nose-throat: Oropharynx clear, dentition fair Lymphatic: No cervical or supraclavicular adenopathy. Mild possible bilateral axillary adenopathy. There is a firm enlarged lymph node of the right inguinal area and pubis. No erythema or discharge  Lungs no rales or rhonchi, good excursion bilaterally Heart regular rate and rhythm, no murmur appreciated Abd soft, nontender, positive bowel sounds MSK no focal spinal tenderness, no joint edema Neuro: non-focal, well-oriented, appropriate affect   Lab Results  Component Value Date   WBC 3.0 (L) 08/13/2023   HGB 11.8 (L) 08/13/2023   HCT 37.0 08/13/2023   MCV 87.3 08/13/2023   PLT 145 (L) 08/13/2023   Lab Results  Component Value Date   FERRITIN 1,247 (H) 08/07/2023   IRON 43 08/07/2023   TIBC 246 (L) 08/07/2023   UIBC 203 08/07/2023   IRONPCTSAT 18 08/07/2023   Lab Results  Component Value Date   RETICCTPCT 2.1 08/07/2023   RBC 4.24 08/13/2023   No results found for: "KPAFRELGTCHN", "LAMBDASER", "KAPLAMBRATIO" No results found for: "IGGSERUM", "IGA", "IGMSERUM" No results found for: "TOTALPROTELP", "ALBUMINELP", "A1GS", "A2GS", "BETS", "BETA2SER", "GAMS", "MSPIKE", "SPEI"   Chemistry      Component Value Date/Time   NA 137 08/13/2023 1518   NA 138 03/10/2020 1138   K 3.8 08/13/2023 1518   CL 103 08/13/2023 1518   CO2 27 08/13/2023 1518   BUN 16 08/13/2023 1518   BUN 10 03/10/2020 1138   CREATININE 0.73  08/13/2023 1518      Component Value Date/Time   CALCIUM 9.4 08/13/2023 1518   ALKPHOS 93 08/13/2023 1518   AST 10 (L) 08/13/2023 1518   ALT 33 08/13/2023 1518   BILITOT 0.3 08/13/2023 1518      No diagnosis found.  Impression and Plan: Ms. Mindel is a very pleasant 77 yo African American female with low-grade MDS, low IPSS-R score.  ESA needed, Hgb 10.3 WBC has fallen from 2.3 to 1.3- no known infection. Has had multiple recent ABX. New lymphadenopathy. Neutropenic precautions discussed. We will recheck labs next week- if not significantly improved I would recommend imaging and a BMB.  Iron studies are pending.    Josph Macho, MD 10/14/20244:34 PM   ADDENDUM: I saw Ms. Wilton.  She got back from a cruise to [place].  This was in September.  She went to [facility] surgery today.  She is gone have an ultrasound.  This is a lymph node in the left inguinal region.  Before he saw ordering other tests, the see what the ultrasound shows.  I suspect that she probably will need a biopsy.  I suppose that she may have some kind of low-grade lymphoma that could be in the bone marrow.  Her blood counts are a little bit better today.  We will hold off on doing any injections.  I know this is very complicated.  Hopefully, we will get to the bottom of what might be going on.  We will plan to get her back probably in about 6 weeks.  I figured that by then, we should have a good idea as to what is going on.

## 2023-08-14 ENCOUNTER — Ambulatory Visit: Payer: Medicare Other

## 2023-08-14 DIAGNOSIS — M25572 Pain in left ankle and joints of left foot: Secondary | ICD-10-CM

## 2023-08-14 DIAGNOSIS — R262 Difficulty in walking, not elsewhere classified: Secondary | ICD-10-CM | POA: Diagnosis not present

## 2023-08-14 DIAGNOSIS — M25571 Pain in right ankle and joints of right foot: Secondary | ICD-10-CM

## 2023-08-14 NOTE — Therapy (Signed)
OUTPATIENT PHYSICAL THERAPY LOWER EXTREMITY TREATMENT   Patient Name: Kristen Murray MRN: [id number] DOB:07/27/1946, 77 y.o., female Today's Date: 08/14/2023  END OF SESSION:  PT End of Session - 08/14/23 0810     Visit Number 2    Date for PT Re-Evaluation 09/20/23    Authorization Type [employer]    Progress Note Due on Visit 10    PT Start Time 0802    PT Stop Time 0845    PT Time Calculation (min) 43 min    Activity Tolerance Patient tolerated treatment well    Behavior During Therapy WFL for tasks assessed/performed              Past Medical History:  Diagnosis Date   Arthritis    Borderline abnormal TFTs    as a teen   CAD (coronary artery disease)    Coronary CTA 3/22: Calcium score 1 (38th percentile), minimal nonobstructive CAD with 0-24% in mid RCA; aortic atherosclerosis   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   Nephrolithiasis    Dr Logan Bores, [facility], Hx of [3][   Polyp of colon    Radiculopathy    Past Surgical History:  Procedure Laterality Date   ABDOMINAL HYSTERECTOMY     TAH/BSO --fibroids, no cancer   BALLOON DILATION N/A 07/20/2014   Procedure: BALLOON DILATION;  Surgeon: Charolett Bumpers, MD;  Location: [facility];  Service: Endoscopy;  Laterality: N/A;   BREAST BIOPSY     Right   CARPAL TUNNEL RELEASE     & trigger thumb RUE; Dr Chaney Malling    COLONOSCOPY W/ POLYPECTOMY  07/2004   Neg 2011; Dr Danise Edge   CYSTOSCOPY  11/2009   Neg   ESOPHAGOGASTRODUODENOSCOPY N/A 07/20/2014   Procedure: ESOPHAGOGASTRODUODENOSCOPY (EGD);  Surgeon: Charolett Bumpers, MD;  Location: [facility];  Service: Endoscopy;  Laterality: N/A;   FLEXIBLE BRONCHOSCOPY W/ UPPER ENDOSCOPY     neg   KNEE ARTHROSCOPY     Left   ROTATOR CUFF REPAIR     R shoulder   TONSILLECTOMY     TRIGGER FINGER RELEASE Right    Right Thumb   Patient Active Problem List   Diagnosis Date Noted   Mitral regurgitation 11/10/2022   Shortness of breath 10/04/2022   Allergic rhinitis due to pollen 05/29/2022   Cervical spondylosis without myelopathy 05/29/2022   Hyperglycemia due to type 2 diabetes mellitus (HCC) 05/29/2022   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   Perennial allergic rhinitis 05/29/2022   Pure hypercholesterolemia 05/29/2022   Simple chronic conjunctivitis 05/29/2022   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   Partial thickness rotator cuff tear 03/17/2022   Dysuria 02/28/2022   CAD (coronary artery disease)    Myelodysplasia, 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   Spinal stenosis of lumbar region 07/30/2020   Chronic leukopenia  05/17/2020   Bilateral knee pain 05/11/2020   Atrial flutter (HCC) 01/29/2020   A-fib (HCC) 01/27/2020   Atrial flutter, paroxysmal (HCC) 01/26/2020   Lumbar spondylosis 10/27/2019   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   Iron deficiency anemia due to chronic blood loss 07/18/2019   Closed fracture of proximal phalanx of great toe 09/02/2018   Nuclear sclerosis of both eyes 08/20/2017   High risk medication use 06/15/2017   Recurrent urinary tract infection 10/14/2015   Pain in the chest    Chest pain at rest 11/26/2014   Shingles 05/14/2014   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   Vaginal atrophy 10/16/2012   Menopause 10/16/2012   Chest pain, atypical 02/19/2012   Type 2 diabetes mellitus with neurological manifestations,  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/19/2012   Migraine 01/09/2012   Interstitial cystitis (chronic) without hematuria 12/20/2011   Cervical stenosis of spinal canal 08/28/2011   Meningioma (HCC) 08/28/2011   COSTOCHONDRITIS 11/16/2010   DISTURBANCES OF SENSATION OF SMELL AND TASTE 04/05/2010   NEPHROLITHIASIS, HX OF 02/08/2010   EDEMA 10/01/2009   Brachial neuritis or radiculitis NOS 06/14/2009   EXTERNAL HEMORRHOIDS 05/25/2009   MUSCLE PAIN 05/17/2009   Hyperlipidemia 04/23/2009   ABNORMAL THYROID FUNCTION TESTS 04/23/2009   MENINGIOMA 12/26/2007   HEADACHE 12/26/2007   RADICULOPATHY 04/02/2007   Essential hypertension 11/21/2006   GERD 11/21/2006    PCP: Eliane Decree, PA  REFERRING PROVIDER: Rodolph Bong  REFERRING DIAG: B ankle pain  THERAPY DIAG:  Difficulty in walking, not elsewhere classified  Pain in right ankle and joints of right foot  Pain in left ankle and joints of left foot  Rationale for Evaluation and Treatment: Rehabilitation  ONSET DATE: 2 weeks ago 07/29/23  SUBJECTIVE:   SUBJECTIVE STATEMENT: Pt reports a lot of aching this morning in B feet/ankles, knees and L elbow  PERTINENT HISTORY: 2 week h/o  PAIN:  Are you having pain? Yes: NPRS scale: 8/10 Pain location: ankles B lateral Pain description: tender, swollen, stiff Aggravating factors: gait  Relieving factors: resting, meds, steroids  PRECAUTIONS: osteopenia  RED FLAGS: None   WEIGHT BEARING RESTRICTIONS: No  FALLS:  Has patient fallen in last 6 months? No  LIVING ENVIRONMENT: Lives with: lives with their spouse Lives in: House/apartment Stairs: Yes: External: 3 steps; on left going up Has following equipment at home: Single point cane  OCCUPATION: retired   PLOF: Independent  PATIENT GOALS: get rid of foot pain so I can walk  NEXT MD VISIT: in 1 month  OBJECTIVE: Note: Objective measures were completed at Evaluation unless otherwise noted.  DIAGNOSTIC FINDINGS: not  available  PATIENT SURVEYS:  LEFS 18/80  COGNITION: Overall cognitive status: Within functional limits for tasks assessed     SENSATION: WFL  EDEMA: mod edema B lateral malleoli   POSTURE:  varum B knees, weight bearing B forefeet  PALPATION: Point tender, exquisitely tender lateral R ankle over talocrual lig and lat border R 5th MT L ankle pt tender peroneal tendons inf to malleolus  LOWER EXTREMITY ROM:  Active ROM Right eval Left eval  Hip flexion    Hip extension    Hip abduction    Hip adduction    Hip internal rotation    Hip external rotation    Knee flexion    Knee extension    Ankle dorsiflexion 0 -18  Ankle plantarflexion 50 40  Ankle inversion 30 30  Ankle eversion nt nt   (Blank rows = not tested)  LOWER EXTREMITY MMT:  MMT Right eval Left eval  Hip flexion    Hip extension    Hip abduction    Hip adduction    Hip internal rotation    Hip external rotation    Knee flexion    Knee extension    Ankle dorsiflexion    Ankle plantarflexion    Ankle inversion    Ankle eversion     (Blank rows = not tested)  FUNCTIONAL TESTS:  Gait speed, 12 sec in 10 m, or 0.2m/sec , with st cane  GAIT: Distance walked: 6' in clinic Assistive device utilized: Single point cane Level of assistance: Modified independence Comments: slowed, avoids heel strike and toe off, foot flat contact, decreased step length B   TODAY'S TREATMENT:                                                                                                                              DATE: 08/14/23 Nustep L3x4min Seated heel raise x 10  STM to L medial arch, plantar fascia, PROM to bil ankles and feet Ankle PF RTB 2x10 bil  08/09/23: eval, gentle calcaneal glides ant and post, gr 2 , each ankle to tolerance Prone for kinesiotaping each ankle, from MT heads to med and lat gastrocs, 2 I pieces each , crossing at achilles tendon\  Seated red theraband ankle pumps, guided to stay  in pain free range each leg, in open chain position    PATIENT EDUCATION:  Education details: POC, goals Person educated: Patient Education method: Explanation, Demonstration, and Tactile cues Education comprehension: verbalized understanding, returned demonstration, and verbal cues required  HOME EXERCISE PROGRAM: Theraband ankle pumps  ASSESSMENT:  CLINICAL IMPRESSION: Pt arrived with increased aching and pain today. MT was the focus of today's session d/t stiffness and pain. Gentle exercises followed for ankle mobility and strength. Decreased pain and stiffness noted post session..  OBJECTIVE IMPAIRMENTS: decreased activity tolerance, decreased balance, difficulty walking, decreased ROM, decreased strength, hypomobility, increased edema, and pain.   ACTIVITY LIMITATIONS: standing, squatting, stairs, and locomotion level  PARTICIPATION LIMITATIONS: meal prep, cleaning, shopping, community activity, and church  PERSONAL FACTORS: Behavior pattern, Past/current experiences, and Time since onset of injury/illness/exacerbation are also affecting patient's functional outcome.   REHAB POTENTIAL: Good  CLINICAL DECISION MAKING: Stable/uncomplicated  EVALUATION COMPLEXITY: Low   GOALS: Goals reviewed with patient? Yes  SHORT TERM GOALS: Target date: 2 weeks, 08/23/23 I HEP Baseline: Goal status: INITIAL    LONG TERM GOALS: Target date: 09/20/23, 6 weeks Improve AROM B ankles to [facility]  Baseline: current dorsiflexion L -18, R 0 Plantarflexion L 40 R 50 Goal status: INITIAL  2.  Improve LEFS to 40/80 Baseline: 18/80 Goal status: INITIAL  3.  Gait speed increase to greater than 1 M/sec for community ambulation Baseline:  Goal status: INITIAL   PLAN:  PT FREQUENCY: 2x/week  PT DURATION: 6 weeks  PLANNED INTERVENTIONS: 97146- PT Re-evaluation, 97110-Therapeutic exercises, 97530- Therapeutic activity, [id number]- Neuromuscular  re-education, [phone]- Self Care, [id number]- Manual  therapy, and Patient/Family education  PLAN FOR NEXT SESSION: ,jt mobs, light t band ankle PF DF . Utilize additional therex, manual techniques, modalities as needed   Darleene Cleaver, PTA 08/14/2023, 10:01 AM

## 2023-08-15 ENCOUNTER — Telehealth (HOSPITAL_BASED_OUTPATIENT_CLINIC_OR_DEPARTMENT_OTHER): Payer: Self-pay

## 2023-08-16 ENCOUNTER — Other Ambulatory Visit: Payer: Self-pay

## 2023-08-16 ENCOUNTER — Ambulatory Visit: Payer: Medicare Other

## 2023-08-16 DIAGNOSIS — M25571 Pain in right ankle and joints of right foot: Secondary | ICD-10-CM

## 2023-08-16 DIAGNOSIS — M25572 Pain in left ankle and joints of left foot: Secondary | ICD-10-CM

## 2023-08-16 DIAGNOSIS — R262 Difficulty in walking, not elsewhere classified: Secondary | ICD-10-CM

## 2023-08-16 NOTE — Therapy (Signed)
OUTPATIENT PHYSICAL THERAPY LOWER EXTREMITY TREATMENT   Patient Name: Kristen Murray MRN: [id number] DOB:[birth year], 77 y.o., female Today's Date: 08/16/2023  END OF SESSION:     Past Medical History:  Diagnosis Date   Arthritis    Borderline abnormal TFTs    as a teen   CAD (coronary artery disease)    Coronary CTA 3/22: Calcium score 1 (38th percentile), minimal nonobstructive CAD with 0-24% in mid RCA; aortic atherosclerosis   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   Nephrolithiasis    Dr Logan Bores, [facility], Hx of [3][   Polyp of colon    Radiculopathy    Past Surgical History:  Procedure Laterality Date   ABDOMINAL HYSTERECTOMY     TAH/BSO --fibroids, no cancer   BALLOON DILATION N/A 07/20/2014   Procedure: BALLOON DILATION;  Surgeon: Charolett Bumpers, MD;  Location: [facility];  Service: Endoscopy;  Laterality: N/A;   BREAST BIOPSY     Right   CARPAL TUNNEL RELEASE     & trigger thumb RUE; Dr Chaney Malling   COLONOSCOPY W/ POLYPECTOMY  07/2004   Neg 2011; Dr Danise Edge   CYSTOSCOPY  11/2009   Neg   ESOPHAGOGASTRODUODENOSCOPY N/A 07/20/2014   Procedure: ESOPHAGOGASTRODUODENOSCOPY (EGD);  Surgeon: Charolett Bumpers, MD;  Location: [facility];  Service: Endoscopy;  Laterality: N/A;   FLEXIBLE BRONCHOSCOPY W/ UPPER ENDOSCOPY     neg   KNEE ARTHROSCOPY     Left   ROTATOR CUFF  REPAIR     R shoulder   TONSILLECTOMY     TRIGGER FINGER RELEASE Right    Right Thumb   Patient Active Problem List   Diagnosis Date Noted   Mitral regurgitation 11/10/2022   Shortness of breath 10/04/2022   Allergic rhinitis due to pollen 05/29/2022   Cervical spondylosis without myelopathy 05/29/2022   Hyperglycemia due to type 2 diabetes mellitus (HCC) 05/29/2022   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   Perennial allergic rhinitis 05/29/2022   Pure hypercholesterolemia 05/29/2022   Simple chronic conjunctivitis 05/29/2022   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   Partial thickness rotator cuff tear 03/17/2022   Dysuria 02/28/2022   CAD (coronary artery disease)    Myelodysplasia, 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   Spinal stenosis of lumbar region 07/30/2020   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   Bilateral knee pain 05/11/2020   Atrial flutter (HCC) 01/29/2020   A-fib (HCC) 01/27/2020   Atrial flutter, paroxysmal (HCC) 01/26/2020   Lumbar spondylosis 10/27/2019   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   Iron deficiency anemia due to chronic blood loss 07/18/2019   Closed fracture of proximal phalanx of great toe 09/02/2018   Nuclear sclerosis of both eyes 08/20/2017   High risk medication use 06/15/2017   Recurrent urinary tract infection 10/14/2015   Pain in the chest  Chest pain at rest 11/26/2014   Shingles 05/14/2014   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   Vaginal atrophy 10/16/2012   Menopause 10/16/2012   Chest pain, atypical 02/19/2012   Type 2 diabetes mellitus with neurological manifestations, 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/19/2012   Migraine 01/09/2012   Interstitial cystitis (chronic) without hematuria 12/20/2011   Cervical stenosis of spinal canal 08/28/2011   Meningioma (HCC) 08/28/2011   COSTOCHONDRITIS 11/16/2010   DISTURBANCES OF SENSATION OF SMELL AND TASTE 04/05/2010   NEPHROLITHIASIS, HX OF 02/08/2010   EDEMA 10/01/2009   Brachial neuritis or radiculitis NOS 06/14/2009    EXTERNAL HEMORRHOIDS 05/25/2009   MUSCLE PAIN 05/17/2009   Hyperlipidemia 04/23/2009   ABNORMAL THYROID FUNCTION TESTS 04/23/2009   MENINGIOMA 12/26/2007   HEADACHE 12/26/2007   RADICULOPATHY 04/02/2007   Essential hypertension 11/21/2006   GERD 11/21/2006    PCP: Eliane Decree, PA  REFERRING PROVIDER: Rodolph Bong  REFERRING DIAG: B ankle pain  THERAPY DIAG:  No diagnosis found.  Rationale for Evaluation and Treatment: Rehabilitation  ONSET DATE: 2 weeks ago 07/29/23  SUBJECTIVE:   SUBJECTIVE STATEMENT: Pt reports a lot of aching this morning in B feet/ankles, knees and L elbow  PERTINENT HISTORY: 2 week h/o  PAIN:  Are you having pain? Yes: NPRS scale: 8/10 Pain location: ankles B lateral Pain description: tender, swollen, stiff Aggravating factors: gait  Relieving factors: resting, meds, steroids  PRECAUTIONS: osteopenia  RED FLAGS: None   WEIGHT BEARING RESTRICTIONS: No  FALLS:  Has patient fallen in last 6 months? No  LIVING ENVIRONMENT: Lives with: lives with their spouse Lives in: House/apartment Stairs: Yes: External: 3 steps; on left going up Has following equipment at home: Single point cane  OCCUPATION: retired   PLOF: Independent  PATIENT GOALS: get rid of foot pain so I can walk  NEXT MD VISIT: in 1 month  OBJECTIVE: Note: Objective measures were completed at Evaluation unless otherwise noted.  DIAGNOSTIC FINDINGS: not available  PATIENT SURVEYS:  LEFS 18/80  COGNITION: Overall cognitive status: Within functional limits for tasks assessed     SENSATION: WFL  EDEMA: mod edema B lateral malleoli   POSTURE:  varum B knees, weight bearing B forefeet  PALPATION: Point tender, exquisitely tender lateral R ankle over talocrual lig and lat border R 5th MT L ankle pt tender peroneal tendons inf to malleolus  LOWER EXTREMITY ROM:  Active ROM Right eval Left eval  Hip flexion    Hip extension    Hip abduction    Hip  adduction    Hip internal rotation    Hip external rotation    Knee flexion    Knee extension    Ankle dorsiflexion 0 -18  Ankle plantarflexion 50 40  Ankle inversion 30 30  Ankle eversion nt nt   (Blank rows = not tested)  LOWER EXTREMITY MMT:  MMT Right eval Left eval  Hip flexion    Hip extension    Hip abduction    Hip adduction    Hip internal rotation    Hip external rotation    Knee flexion    Knee extension    Ankle dorsiflexion    Ankle plantarflexion    Ankle inversion    Ankle eversion     (Blank rows = not tested)  FUNCTIONAL TESTS:  Gait speed, 12 sec in 10 m, or 0.21m/sec , with st cane  GAIT: Distance walked: 38' in clinic Assistive device utilized: Single point cane Level of assistance: Modified independence Comments: slowed, avoids heel strike  and toe off, foot flat contact, decreased step length B   TODAY'S TREATMENT:                                                                                                                              DATE: 08/16/23: Long sitting for retrograde massage each foot and ankle Long sitting for gentle AP glides subtalar jt and subtalar distraction each foot Kinesiotaping B , 2-I pieces from tarsal heads to B gastroc heads, crossed, to provide additional support B ankles  Increased resistance for ankle pumps to blue t band, performed well  Advised pt to slighlty elevate B ankles on 2 pillows to reduce edema  08/14/23 Nustep L3x64min Seated heel raise x 10  STM to L medial arch, plantar fascia, PROM to bil ankles and feet Ankle PF RTB 2x10 bil  08/09/23: eval, gentle calcaneal glides ant and post, gr 2 , each ankle to tolerance Prone for kinesiotaping each ankle, from MT heads to med and lat gastrocs, 2 I pieces each , crossing at achilles tendon  Seated red theraband ankle pumps, guided to stay in pain free range each leg, in open chain position    PATIENT EDUCATION:  Education details: POC,  goals Person educated: Patient Education method: Explanation, Demonstration, and Tactile cues Education comprehension: verbalized understanding, returned demonstration, and verbal cues required  HOME EXERCISE PROGRAM: Theraband ankle pumps  ASSESSMENT:  CLINICAL IMPRESSION: Pt with ongoing B ankle jt effusion and pain. Has nearly full pf, d flex, pt tender lat ankle jts B.  Progressed with gentle manual techniques to improve jt mobility and nutrition and increased resistance with theraband. Will reassess again next visit.  OBJECTIVE IMPAIRMENTS: decreased activity tolerance, decreased balance, difficulty walking, decreased ROM, decreased strength, hypomobility, increased edema, and pain.   ACTIVITY LIMITATIONS: standing, squatting, stairs, and locomotion level  PARTICIPATION LIMITATIONS: meal prep, cleaning, shopping, community activity, and church  PERSONAL FACTORS: Behavior pattern, Past/current experiences, and Time since onset of injury/illness/exacerbation are also affecting patient's functional outcome.   REHAB POTENTIAL: Good  CLINICAL DECISION MAKING: Stable/uncomplicated  EVALUATION COMPLEXITY: Low   GOALS: Goals reviewed with patient? Yes  SHORT TERM GOALS: Target date: 2 weeks, 08/23/23 I HEP Baseline: Goal status: INITIAL    LONG TERM GOALS: Target date: 09/20/23, 6 weeks Improve AROM B ankles to [facility]  Baseline: current dorsiflexion L -18, R 0 Plantarflexion L 40 R 50 Goal status: INITIAL  2.  Improve LEFS to 40/80 Baseline: 18/80 Goal status: INITIAL  3.  Gait speed increase to greater than 1 M/sec for community ambulation Baseline:  Goal status: INITIAL   PLAN:  PT FREQUENCY: 2x/week  PT DURATION: 6 weeks  PLANNED INTERVENTIONS: 97146- PT Re-evaluation, 97110-Therapeutic exercises, 97530- Therapeutic activity, 97112- Neuromuscular re-education, 97535- Self Care, 16109- Manual therapy, and Patient/Family education  PLAN FOR NEXT SESSION: ,jt  mobs, light t band ankle PF DF . Utilize additional therex, manual techniques, modalities as needed   Yeshua Stryker  L Ayomide Zuleta, PT, DPT, OCS 08/16/2023, 5:58 PM

## 2023-08-17 ENCOUNTER — Ambulatory Visit
Admission: RE | Admit: 2023-08-17 | Discharge: 2023-08-17 | Disposition: A | Discharge status: Home / Self Care | Payer: Medicare Other | Source: Ambulatory Visit | Attending: Surgery | Admitting: Surgery

## 2023-08-17 ENCOUNTER — Encounter: Payer: Self-pay | Admitting: Family

## 2023-08-17 DIAGNOSIS — R1909 Other intra-abdominal and pelvic swelling, mass and lump: Secondary | ICD-10-CM

## 2023-08-17 DIAGNOSIS — R590 Localized enlarged lymph nodes: Secondary | ICD-10-CM

## 2023-08-21 ENCOUNTER — Ambulatory Visit: Payer: Medicare Other

## 2023-08-21 DIAGNOSIS — M25571 Pain in right ankle and joints of right foot: Secondary | ICD-10-CM

## 2023-08-21 DIAGNOSIS — M25572 Pain in left ankle and joints of left foot: Secondary | ICD-10-CM

## 2023-08-21 DIAGNOSIS — R262 Difficulty in walking, not elsewhere classified: Secondary | ICD-10-CM

## 2023-08-21 NOTE — Therapy (Signed)
OUTPATIENT PHYSICAL THERAPY LOWER EXTREMITY TREATMENT   Patient Name: Kristen Murray MRN: [id number] DOB:1946/05/28, 77 y.o., female Today's Date: 08/21/2023  END OF SESSION:  PT End of Session - 08/21/23 1535     Visit Number 4    Date for PT Re-Evaluation 09/20/23    Authorization Type [employer] Medicare    Progress Note Due on Visit 10    PT Start Time 1447    PT Stop Time 1530    PT Time Calculation (min) 43 min    Activity Tolerance Patient tolerated treatment well    Behavior During Therapy WFL for tasks assessed/performed               Past Medical History:  Diagnosis Date   Arthritis    Borderline abnormal TFTs    as a teen   CAD (coronary artery disease)    Coronary CTA 3/22: Calcium score 1 (38th percentile), minimal nonobstructive CAD with 0-24% in mid RCA; aortic atherosclerosis   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   Nephrolithiasis    Dr Logan Bores, [facility], Hx of [3][   Polyp of colon    Radiculopathy    Past Surgical History:  Procedure Laterality Date   ABDOMINAL HYSTERECTOMY     TAH/BSO --fibroids, no cancer   BALLOON DILATION N/A 07/20/2014   Procedure: BALLOON DILATION;  Surgeon: Charolett Bumpers, MD;  Location: [facility];  Service: Endoscopy;  Laterality: N/A;   BREAST BIOPSY     Right   CARPAL TUNNEL RELEASE     & trigger thumb RUE; Dr Chaney Malling    COLONOSCOPY W/ POLYPECTOMY  07/2004   Neg 2011; Dr Danise Edge   CYSTOSCOPY  11/2009   Neg   ESOPHAGOGASTRODUODENOSCOPY N/A 07/20/2014   Procedure: ESOPHAGOGASTRODUODENOSCOPY (EGD);  Surgeon: Charolett Bumpers, MD;  Location: [facility];  Service: Endoscopy;  Laterality: N/A;   FLEXIBLE BRONCHOSCOPY W/ UPPER ENDOSCOPY     neg   KNEE ARTHROSCOPY     Left   ROTATOR CUFF REPAIR     R shoulder   TONSILLECTOMY     TRIGGER FINGER RELEASE Right    Right Thumb   Patient Active Problem List   Diagnosis Date Noted   Mitral regurgitation 11/10/2022   Shortness of breath 10/04/2022   Allergic rhinitis due to pollen 05/29/2022   Cervical spondylosis without myelopathy 05/29/2022   Hyperglycemia due to type 2 diabetes mellitus (HCC) 05/29/2022   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   Perennial allergic rhinitis 05/29/2022   Pure hypercholesterolemia 05/29/2022   Simple chronic conjunctivitis 05/29/2022   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   Partial thickness rotator cuff tear 03/17/2022   Dysuria 02/28/2022   CAD (coronary artery disease)    Myelodysplasia, 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   Spinal stenosis of lumbar region 07/30/2020   Chronic  leukopenia 05/17/2020   Bilateral knee pain 05/11/2020   Atrial flutter (HCC) 01/29/2020   A-fib (HCC) 01/27/2020   Atrial flutter, paroxysmal (HCC) 01/26/2020   Lumbar spondylosis 10/27/2019   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   Iron deficiency anemia due to chronic blood loss 07/18/2019   Closed fracture of proximal phalanx of great toe 09/02/2018   Nuclear sclerosis of both eyes 08/20/2017   High risk medication use 06/15/2017   Recurrent urinary tract infection 10/14/2015   Pain in the chest    Chest pain at rest 11/26/2014   Shingles 05/14/2014   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   Vaginal atrophy 10/16/2012   Menopause 10/16/2012   Chest pain, atypical 02/19/2012   Type 2 diabetes mellitus with neurological manifestations,  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/19/2012   Migraine 01/09/2012   Interstitial cystitis (chronic) without hematuria 12/20/2011   Cervical stenosis of spinal canal 08/28/2011   Meningioma (HCC) 08/28/2011   COSTOCHONDRITIS 11/16/2010   DISTURBANCES OF SENSATION OF SMELL AND TASTE 04/05/2010   NEPHROLITHIASIS, HX OF 02/08/2010   EDEMA 10/01/2009   Brachial neuritis or radiculitis NOS 06/14/2009   EXTERNAL HEMORRHOIDS 05/25/2009   MUSCLE PAIN 05/17/2009   Hyperlipidemia 04/23/2009   ABNORMAL THYROID FUNCTION TESTS 04/23/2009   MENINGIOMA 12/26/2007   HEADACHE 12/26/2007   RADICULOPATHY 04/02/2007   Essential hypertension 11/21/2006   GERD 11/21/2006    PCP: Eliane Decree, PA  REFERRING PROVIDER: Rodolph Bong  REFERRING DIAG: B ankle pain  THERAPY DIAG:  Difficulty in walking, not elsewhere classified  Pain in right ankle and joints of right foot  Pain in left ankle and joints of left foot  Rationale for Evaluation and Treatment: Rehabilitation  ONSET DATE: 2 weeks ago 07/29/23  SUBJECTIVE:   SUBJECTIVE STATEMENT: Pt concerned about swelling in B ankles.  PERTINENT HISTORY: 2 week h/o  PAIN:  Are you having pain? Yes: NPRS scale: 5/10 Pain location: ankles B lateral Pain description: tender, swollen, stiff Aggravating factors: gait  Relieving factors: resting, meds, steroids  PRECAUTIONS: osteopenia  RED FLAGS: None   WEIGHT BEARING RESTRICTIONS: No  FALLS:  Has patient fallen in last 6 months? No  LIVING ENVIRONMENT: Lives with: lives with their spouse Lives in: House/apartment Stairs: Yes: External: 3 steps; on left going up Has following equipment at home: Single point cane  OCCUPATION: retired   PLOF: Independent  PATIENT GOALS: get rid of foot pain so I can walk  NEXT MD VISIT: in 1 month  OBJECTIVE: Note: Objective measures were completed at Evaluation unless otherwise noted.  DIAGNOSTIC FINDINGS: not available  PATIENT SURVEYS:  LEFS  18/80  COGNITION: Overall cognitive status: Within functional limits for tasks assessed     SENSATION: WFL  EDEMA: mod edema B lateral malleoli   POSTURE:  varum B knees, weight bearing B forefeet  PALPATION: Point tender, exquisitely tender lateral R ankle over talocrual lig and lat border R 5th MT L ankle pt tender peroneal tendons inf to malleolus  LOWER EXTREMITY ROM:  Active ROM Right eval Left eval  Hip flexion    Hip extension    Hip abduction    Hip adduction    Hip internal rotation    Hip external rotation    Knee flexion    Knee extension    Ankle dorsiflexion 0 -18  Ankle plantarflexion 50 40  Ankle inversion 30 30  Ankle eversion nt nt   (Blank rows = not tested)  LOWER EXTREMITY MMT:  MMT Right eval Left eval  Hip  flexion    Hip extension    Hip abduction    Hip adduction    Hip internal rotation    Hip external rotation    Knee flexion    Knee extension    Ankle dorsiflexion    Ankle plantarflexion    Ankle inversion    Ankle eversion     (Blank rows = not tested)  FUNCTIONAL TESTS:  Gait speed, 12 sec in 10 m, or 0.54m/sec , with st cane  GAIT: Distance walked: 79' in clinic Assistive device utilized: Single point cane Level of assistance: Modified independence Comments: slowed, avoids heel strike and toe off, foot flat contact, decreased step length B   TODAY'S TREATMENT:                                                                                                                              DATE: 08/21/23 Manual edema resorption for B feet and ankles Subtalar joint mobs for L ankle eversion/inversion Ankle pumps in supine x 20 Heel/toe raise x 10 seated Seated PF with blue TB x 15  08/16/23: Long sitting for retrograde massage each foot and ankle Long sitting for gentle AP glides subtalar jt and subtalar distraction each foot Kinesiotaping B , 2-I pieces from tarsal heads to B gastroc heads, crossed, to provide  additional support B ankles  Increased resistance for ankle pumps to blue t band, performed well  Advised pt to slighlty elevate B ankles on 2 pillows to reduce edema  08/14/23 Nustep L3x70min Seated heel raise x 10  STM to L medial arch, plantar fascia, PROM to bil ankles and feet Ankle PF RTB 2x10 bil  08/09/23: eval, gentle calcaneal glides ant and post, gr 2 , each ankle to tolerance Prone for kinesiotaping each ankle, from MT heads to med and lat gastrocs, 2 I pieces each , crossing at achilles tendon  Seated red theraband ankle pumps, guided to stay in pain free range each leg, in open chain position    PATIENT EDUCATION:  Education details: POC, goals Person educated: Patient Education method: Explanation, Demonstration, and Tactile cues Education comprehension: verbalized understanding, returned demonstration, and verbal cues required  HOME EXERCISE PROGRAM: Theraband ankle pumps  ASSESSMENT:  CLINICAL IMPRESSION: Pt with ongoing B ankle jt effusion and pain, which limits WB tolerance. Limited L ankle EV with PROM.  Progressed with gentle manual techniques to improve jt mobility followed by there ex for mobility and strengthening. Good response to treatment. OBJECTIVE IMPAIRMENTS: decreased activity tolerance, decreased balance, difficulty walking, decreased ROM, decreased strength, hypomobility, increased edema, and pain.   ACTIVITY LIMITATIONS: standing, squatting, stairs, and locomotion level  PARTICIPATION LIMITATIONS: meal prep, cleaning, shopping, community activity, and church  PERSONAL FACTORS: Behavior pattern, Past/current experiences, and Time since onset of injury/illness/exacerbation are also affecting patient's functional outcome.   REHAB POTENTIAL: Good  CLINICAL DECISION MAKING: Stable/uncomplicated  EVALUATION COMPLEXITY: Low   GOALS: Goals reviewed with patient? Yes  SHORT TERM GOALS: Target date: 2 weeks, 08/23/23 I HEP Baseline: Goal  status: INITIAL    LONG TERM GOALS: Target date: 09/20/23, 6 weeks Improve AROM B ankles to [facility]  Baseline: current dorsiflexion L -18, R 0 Plantarflexion L 40 R 50 Goal status: INITIAL  2.  Improve LEFS to 40/80 Baseline: 18/80 Goal status: INITIAL  3.  Gait speed increase to greater than 1 M/sec for community ambulation Baseline:  Goal status: INITIAL   PLAN:  PT FREQUENCY: 2x/week  PT DURATION: 6 weeks  PLANNED INTERVENTIONS: 97146- PT Re-evaluation, 97110-Therapeutic exercises, 97530- Therapeutic activity, 97112- Neuromuscular re-education, 97535- Self Care, 86578- Manual therapy, and Patient/Family education  PLAN FOR NEXT SESSION: pt reports bad reaction to last round of KT tape ,jt mobs, light t band ankle PF DF . Utilize additional therex, manual techniques, modalities as needed   Darleene Cleaver, PTA 08/21/2023, 3:48 PM

## 2023-08-23 ENCOUNTER — Other Ambulatory Visit: Payer: Self-pay

## 2023-08-23 ENCOUNTER — Ambulatory Visit: Payer: Medicare Other

## 2023-08-23 DIAGNOSIS — M25572 Pain in left ankle and joints of left foot: Secondary | ICD-10-CM

## 2023-08-23 DIAGNOSIS — R262 Difficulty in walking, not elsewhere classified: Secondary | ICD-10-CM | POA: Diagnosis not present

## 2023-08-23 DIAGNOSIS — M25571 Pain in right ankle and joints of right foot: Secondary | ICD-10-CM

## 2023-08-23 NOTE — Therapy (Signed)
OUTPATIENT PHYSICAL THERAPY LOWER EXTREMITY TREATMENT   Patient Name: Kristen Murray MRN: [id number] DOB:September 25, 1946, 77 y.o., female Today's Date: 08/23/2023  END OF SESSION:  PT End of Session - 08/23/23 1730     Visit Number 5    Date for PT Re-Evaluation 09/20/23    Authorization Type [employer] Medicare    Progress Note Due on Visit 10    PT Start Time 1534    PT Stop Time 1606    PT Time Calculation (min) 32 min    Activity Tolerance Patient limited by pain    Behavior During Therapy WFL for tasks assessed/performed                Past Medical History:  Diagnosis Date   Arthritis    Borderline abnormal TFTs    as a teen   CAD (coronary artery disease)    Coronary CTA 3/22: Calcium score 1 (38th percentile), minimal nonobstructive CAD with 0-24% in mid RCA; aortic atherosclerosis   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   Nephrolithiasis    Dr Logan Bores, [facility], Hx of [3][   Polyp of colon    Radiculopathy    Past Surgical History:  Procedure Laterality Date   ABDOMINAL HYSTERECTOMY     TAH/BSO --fibroids, no cancer   BALLOON DILATION N/A 07/20/2014   Procedure: BALLOON DILATION;  Surgeon: Charolett Bumpers, MD;  Location: [facility];  Service: Endoscopy;  Laterality: N/A;   BREAST BIOPSY     Right   CARPAL TUNNEL RELEASE     & trigger thumb RUE; Dr Chaney Malling   COLONOSCOPY W/  POLYPECTOMY  07/2004   Neg 2011; Dr Danise Edge   CYSTOSCOPY  11/2009   Neg   ESOPHAGOGASTRODUODENOSCOPY N/A 07/20/2014   Procedure: ESOPHAGOGASTRODUODENOSCOPY (EGD);  Surgeon: Charolett Bumpers, MD;  Location: [facility];  Service: Endoscopy;  Laterality: N/A;   FLEXIBLE BRONCHOSCOPY W/ UPPER ENDOSCOPY     neg   KNEE ARTHROSCOPY     Left   ROTATOR CUFF REPAIR     R shoulder   TONSILLECTOMY     TRIGGER FINGER RELEASE Right    Right Thumb   Patient Active Problem List   Diagnosis Date Noted   Mitral regurgitation 11/10/2022   Shortness of breath 10/04/2022   Allergic rhinitis due to pollen 05/29/2022   Cervical spondylosis without myelopathy 05/29/2022   Hyperglycemia due to type 2 diabetes mellitus (HCC) 05/29/2022   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   Perennial allergic rhinitis 05/29/2022   Pure hypercholesterolemia 05/29/2022   Simple chronic conjunctivitis 05/29/2022   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   Partial thickness rotator cuff tear 03/17/2022   Dysuria 02/28/2022   CAD (coronary artery disease)    Myelodysplasia, 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   Spinal stenosis of lumbar region 07/30/2020  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   Bilateral knee pain 05/11/2020   Atrial flutter (HCC) 01/29/2020   A-fib (HCC) 01/27/2020   Atrial flutter, paroxysmal (HCC) 01/26/2020   Lumbar spondylosis 10/27/2019   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   Iron deficiency anemia due to chronic blood loss 07/18/2019   Closed fracture of proximal phalanx of great toe 09/02/2018   Nuclear sclerosis of both eyes 08/20/2017   High risk medication use 06/15/2017   Recurrent urinary tract infection 10/14/2015   Pain in the chest    Chest pain at rest 11/26/2014   Shingles 05/14/2014   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   Vaginal atrophy 10/16/2012   Menopause 10/16/2012   Chest pain, atypical 02/19/2012   Type 2 diabetes mellitus with neurological manifestations, controlled (HCC)  02/19/2012   Migraine 01/09/2012   Interstitial cystitis (chronic) without hematuria 12/20/2011   Cervical stenosis of spinal canal 08/28/2011   Meningioma (HCC) 08/28/2011   COSTOCHONDRITIS 11/16/2010   DISTURBANCES OF SENSATION OF SMELL AND TASTE 04/05/2010   NEPHROLITHIASIS, HX OF 02/08/2010   EDEMA 10/01/2009   Brachial neuritis or radiculitis NOS 06/14/2009   EXTERNAL HEMORRHOIDS 05/25/2009   MUSCLE PAIN 05/17/2009   Hyperlipidemia 04/23/2009   ABNORMAL THYROID FUNCTION TESTS 04/23/2009   MENINGIOMA 12/26/2007   HEADACHE 12/26/2007   RADICULOPATHY 04/02/2007   Essential hypertension 11/21/2006   GERD 11/21/2006    PCP: Eliane Decree, PA  REFERRING PROVIDER: Rodolph Bong  REFERRING DIAG: B ankle pain  THERAPY DIAG:  Difficulty in walking, not elsewhere classified  Pain in right ankle and joints of right foot  Pain in left ankle and joints of left foot  Rationale for Evaluation and Treatment: Rehabilitation  ONSET DATE: 2 weeks ago 07/29/23  SUBJECTIVE:   SUBJECTIVE STATEMENT: Pt concerned about swelling in B ankles., still painful when  wakes up in am. Pain weight bearing with walking. Found old ankle braces and wearing them  PERTINENT HISTORY: 2 week h/o  PAIN:  Are you having pain? Yes: NPRS scale: 5/10 Pain location: ankles B lateral Pain description: tender, swollen, stiff Aggravating factors: gait  Relieving factors: resting, meds, steroids  PRECAUTIONS: osteopenia  RED FLAGS: None   WEIGHT BEARING RESTRICTIONS: No  FALLS:  Has patient fallen in last 6 months? No  LIVING ENVIRONMENT: Lives with: lives with their spouse Lives in: House/apartment Stairs: Yes: External: 3 steps; on left going up Has following equipment at home: Single point cane  OCCUPATION: retired   PLOF: Independent  PATIENT GOALS: get rid of foot pain so I can walk  NEXT MD VISIT: in 1 month  OBJECTIVE: Note: Objective measures were completed at Evaluation  unless otherwise noted.  DIAGNOSTIC FINDINGS: not available  PATIENT SURVEYS:  LEFS 18/80  COGNITION: Overall cognitive status: Within functional limits for tasks assessed     SENSATION: WFL  EDEMA: mod edema B lateral malleoli   POSTURE:  varum B knees, weight bearing B forefeet  PALPATION: Point tender, exquisitely tender lateral R ankle over talocrual lig and lat border R 5th MT L ankle pt tender peroneal tendons inf to malleolus  LOWER EXTREMITY ROM:  Active ROM Right eval Left eval  Hip flexion    Hip extension    Hip abduction    Hip adduction    Hip internal rotation    Hip external rotation    Knee flexion    Knee extension    Ankle dorsiflexion 0 -18  Ankle plantarflexion 50 40  Ankle inversion 30 30  Ankle eversion  nt nt   (Blank rows = not tested)  LOWER EXTREMITY MMT:  MMT Right eval Left eval  Hip flexion    Hip extension    Hip abduction    Hip adduction    Hip internal rotation    Hip external rotation    Knee flexion    Knee extension    Ankle dorsiflexion    Ankle plantarflexion    Ankle inversion    Ankle eversion     (Blank rows = not tested)  FUNCTIONAL TESTS:  Gait speed, 12 sec in 10 m, or 0.92m/sec , with st cane  GAIT: Distance walked: 58' in clinic Assistive device utilized: Single point cane Level of assistance: Modified independence Comments: slowed, avoids heel strike and toe off, foot flat contact, decreased step length B   TODAY'S TREATMENT:                                                                                                                              DATE: 08/23/23:  AAROM L ankle dorsiflexion 2, R 12 Supine for retrograde massage B lower legs Gentle AP gr 1 glides subtalar jts B Gentle stretching B ankles all planes with light overpressure B ankles   08/21/23 Manual edema resorption for B feet and ankles Subtalar joint mobs for L ankle eversion/inversion Ankle pumps in supine x 20 Heel/toe  raise x 10 seated Seated PF with blue TB x 15  08/16/23: Long sitting for retrograde massage each foot and ankle Long sitting for gentle AP glides subtalar jt and subtalar distraction each foot Kinesiotaping B , 2-I pieces from tarsal heads to B gastroc heads, crossed, to provide additional support B ankles  Increased resistance for ankle pumps to blue t band, performed well  Advised pt to slighlty elevate B ankles on 2 pillows to reduce edema  08/14/23 Nustep L3x58min Seated heel raise x 10  STM to L medial arch, plantar fascia, PROM to bil ankles and feet Ankle PF RTB 2x10 bil  08/09/23: eval, gentle calcaneal glides ant and post, gr 2 , each ankle to tolerance Prone for kinesiotaping each ankle, from MT heads to med and lat gastrocs, 2 I pieces each , crossing at achilles tendon  Seated red theraband ankle pumps, guided to stay in pain free range each leg, in open chain position    PATIENT EDUCATION:  Education details: POC, goals Person educated: Patient Education method: Explanation, Demonstration, and Tactile cues Education comprehension: verbalized understanding, returned demonstration, and verbal cues required  HOME EXERCISE PROGRAM: Theraband ankle pumps  ASSESSMENT:  CLINICAL IMPRESSION: Patient very tender B lateral malleoli as well as lat subtalar jts, poor tolerance of any palpation today.  Did tolerate very gentle stretching.  She will see foot/ ankle specialist Monday.  Advised her that most likely we should hold other PT appts , she may need follow up/ additional x rays B ankles due to ongoing bone/ jt inflammation.  She will  see foot specialist Monday and call regarding PT following that appt.  \ OBJECTIVE IMPAIRMENTS: decreased activity tolerance, decreased balance, difficulty walking, decreased ROM, decreased strength, hypomobility, increased edema, and pain.   ACTIVITY LIMITATIONS: standing, squatting, stairs, and locomotion level  PARTICIPATION  LIMITATIONS: meal prep, cleaning, shopping, community activity, and church  PERSONAL FACTORS: Behavior pattern, Past/current experiences, and Time since onset of injury/illness/exacerbation are also affecting patient's functional outcome.   REHAB POTENTIAL: Good  CLINICAL DECISION MAKING: Stable/uncomplicated  EVALUATION COMPLEXITY: Low   GOALS: Goals reviewed with patient? Yes  SHORT TERM GOALS: Target date: 2 weeks, 08/23/23 I HEP Baseline: Goal status: INITIAL    LONG TERM GOALS: Target date: 09/20/23, 6 weeks Improve AROM B ankles to [facility]  Baseline: current dorsiflexion L -18, R 0 Plantarflexion L 40 R 50 Goal status: INITIAL  2.  Improve LEFS to 40/80 Baseline: 18/80 Goal status: INITIAL  3.  Gait speed increase to greater than 1 M/sec for community ambulation Baseline:  Goal status: INITIAL   PLAN:  PT FREQUENCY: 2x/week  PT DURATION: 6 weeks  PLANNED INTERVENTIONS: 97146- PT Re-evaluation, 97110-Therapeutic exercises, 97530- Therapeutic activity, 97112- Neuromuscular re-education, 97535- Self Care, 53664- Manual therapy, and Patient/Family education  PLAN FOR NEXT SESSION: await new appt with foot specialist on Monday  Layonna Dobie L Shamere Dilworth, PT, DPT, OCS 08/23/2023, 5:48 PM

## 2023-08-27 ENCOUNTER — Ambulatory Visit (INDEPENDENT_AMBULATORY_CARE_PROVIDER_SITE_OTHER): Payer: Medicare Other | Admitting: Podiatry

## 2023-08-27 ENCOUNTER — Encounter: Payer: Self-pay | Admitting: Podiatry

## 2023-08-27 DIAGNOSIS — M79674 Pain in right toe(s): Secondary | ICD-10-CM | POA: Diagnosis not present

## 2023-08-27 DIAGNOSIS — B351 Tinea unguium: Secondary | ICD-10-CM

## 2023-08-27 DIAGNOSIS — R600 Localized edema: Secondary | ICD-10-CM | POA: Diagnosis not present

## 2023-08-27 DIAGNOSIS — M79675 Pain in left toe(s): Secondary | ICD-10-CM | POA: Diagnosis not present

## 2023-08-27 NOTE — Progress Notes (Signed)
No chief complaint on file.   SUBJECTIVE Patient with a history of diabetes mellitus presents to office today complaining of elongated, thickened nails that cause pain while ambulating in shoes.  Patient is unable to trim their own nails.   Patient states that she continues to have some slight tenderness with swelling to the ankles bilaterally.  The injections only helped minimally.  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   Nephrolithiasis    Dr Logan Bores, [facility], Hx of [3][   Polyp of colon    Radiculopathy     Allergies  Allergen Reactions   Codeine Hives and Other (See Comments)    Because of a history of documented adverse serious drug reaction, Medi Alert bracelet  is recommended   Macrodantin [Nitrofurantoin Macrocrystal] Other (See Comments)    Numbness, aching all over   Other Shortness Of Breath, Rash and Other (See Comments)    Pine nuts & walnuts throat congestion. No documented angioedema.    Propoxyphene Hives, Swelling, Rash and Other (See Comments)   Propoxyphene Hcl Hives, Swelling and Rash   Dulaglutide Other (See  Comments)    Other reaction(s): weak, ha, no appetite   Latex Rash and Other (See Comments)   Metformin Hcl Other (See Comments)    Other reaction(s): leg discomfort   Neosporin [Bacitracin-Polymyxin B] Other (See Comments)    Blisters   Zoster Vac Recomb Adjuvanted Rash and Other (See Comments)    Other reaction(s): confusion   Augmentin [Amoxicillin-Pot Clavulanate] Itching   Avocado Rash   Banana Rash   Ciprofloxacin Nausea Only and Other (See Comments)    Nausea (Note: Patient takes this when on mission trips, however)   Tizanidine Other (See Comments)    Reaction not recalled   Tramadol Nausea Only     OBJECTIVE General Patient is awake, alert, and oriented x 3 and in no acute distress. Derm Skin is dry and supple bilateral. Negative open lesions or macerations. Remaining integument unremarkable. Nails are tender, long, thickened and dystrophic with subungual debris, consistent with onychomycosis, 1-5 bilateral. No signs of infection noted. Vasc  DP and PT pedal pulses palpable bilaterally. Temperature gradient within normal limits.  There is some mild edema noted to the bilateral foot and ankles Neuro light touch and protective threshold sensation diminished bilaterally.  Musculoskeletal Exam range of motion WNL.  Muscle strength 5/5 all compartments.  There is to be some tenderness with palpation noted to the bilateral ankle joints Radiographic exam B/L ankles 06/04/2023 diffuse osteoporosis noted.  Joint  spaces mostly preserved.  Tibiotalar joint congruent.  There is some slight joint space narrowing ankle mortise view left along the lateral gutter.  No acute fractures identified.  ASSESSMENT 1. Diabetes Mellitus w/ peripheral neuropathy 2.  Pain due to onychomycosis of toenails bilateral 3.  Capsulitis/arthritis with mild edema bilateral ankles  PLAN OF CARE -Patient evaluated today.   -Instructed to maintain good pedal hygiene and foot care. Stressed importance of  controlling blood sugar.  -Mechanical debridement of nails 1-5 bilaterally performed using a nail nipper. Filed with dremel without incident.  - Patient states that the injections last visit did not significantly help.  No injections administered today - Compression ankle sleeves dispensed.  Wear daily -Return to clinic 3 months routine footcare    Felecia Shelling, DPM [facility]  Dr. Felecia Shelling, DPM    [address] [place], [place] [address]                Office [phone]  Fax [phone]

## 2023-08-28 ENCOUNTER — Ambulatory Visit: Payer: Medicare Other

## 2023-08-28 DIAGNOSIS — M25572 Pain in left ankle and joints of left foot: Secondary | ICD-10-CM

## 2023-08-28 DIAGNOSIS — M25571 Pain in right ankle and joints of right foot: Secondary | ICD-10-CM

## 2023-08-28 DIAGNOSIS — R262 Difficulty in walking, not elsewhere classified: Secondary | ICD-10-CM

## 2023-08-28 NOTE — Therapy (Signed)
OUTPATIENT PHYSICAL THERAPY LOWER EXTREMITY TREATMENT   Patient Name: Kristen Murray MRN: [id number] DOB:April 08, 1946, 77 y.o., female Today's Date: 08/28/2023  END OF SESSION:  PT End of Session - 08/28/23 1448     Visit Number 6    Date for PT Re-Evaluation 09/20/23    Authorization Type [employer] Medicare    Progress Note Due on Visit 10    PT Start Time 1401    PT Stop Time 1443    PT Time Calculation (min) 42 min    Activity Tolerance Patient tolerated treatment well    Behavior During Therapy WFL for tasks assessed/performed                 Past Medical History:  Diagnosis Date   Arthritis    Borderline abnormal TFTs    as a teen   CAD (coronary artery disease)    Coronary CTA 3/22: Calcium score 1 (38th percentile), minimal nonobstructive CAD with 0-24% in mid RCA; aortic atherosclerosis   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   Nephrolithiasis    Dr Logan Bores, [facility], Hx of [3][   Polyp of colon    Radiculopathy    Past Surgical History:  Procedure Laterality Date   ABDOMINAL HYSTERECTOMY     TAH/BSO --fibroids, no cancer   BALLOON DILATION N/A 07/20/2014   Procedure: BALLOON DILATION;  Surgeon: Charolett Bumpers, MD;  Location: [facility];  Service: Endoscopy;  Laterality: N/A;   BREAST BIOPSY     Right   CARPAL TUNNEL RELEASE     & trigger thumb RUE; Dr Chaney Malling    COLONOSCOPY W/ POLYPECTOMY  07/2004   Neg 2011; Dr Danise Edge   CYSTOSCOPY  11/2009   Neg   ESOPHAGOGASTRODUODENOSCOPY N/A 07/20/2014   Procedure: ESOPHAGOGASTRODUODENOSCOPY (EGD);  Surgeon: Charolett Bumpers, MD;  Location: [facility];  Service: Endoscopy;  Laterality: N/A;   FLEXIBLE BRONCHOSCOPY W/ UPPER ENDOSCOPY     neg   KNEE ARTHROSCOPY     Left   ROTATOR CUFF REPAIR     R shoulder   TONSILLECTOMY     TRIGGER FINGER RELEASE Right    Right Thumb   Patient Active Problem List   Diagnosis Date Noted   Mitral regurgitation 11/10/2022   Shortness of breath 10/04/2022   Allergic rhinitis due to pollen 05/29/2022   Cervical spondylosis without myelopathy 05/29/2022   Hyperglycemia due to type 2 diabetes mellitus (HCC) 05/29/2022   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   Perennial allergic rhinitis 05/29/2022   Pure hypercholesterolemia 05/29/2022   Simple chronic conjunctivitis 05/29/2022   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   Partial thickness rotator cuff tear 03/17/2022   Dysuria 02/28/2022   CAD (coronary artery disease)    Myelodysplasia, 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   Spinal stenosis of lumbar region 07/30/2020  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   Bilateral knee pain 05/11/2020   Atrial flutter (HCC) 01/29/2020   A-fib (HCC) 01/27/2020   Atrial flutter, paroxysmal (HCC) 01/26/2020   Lumbar spondylosis 10/27/2019   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   Iron deficiency anemia due to chronic blood loss 07/18/2019   Closed fracture of proximal phalanx of great toe 09/02/2018   Nuclear sclerosis of both eyes 08/20/2017   High risk medication use 06/15/2017   Recurrent urinary tract infection 10/14/2015   Pain in the chest    Chest pain at rest 11/26/2014   Shingles 05/14/2014   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   Vaginal atrophy 10/16/2012   Menopause 10/16/2012   Chest pain, atypical 02/19/2012   Type 2 diabetes mellitus with neurological manifestations,  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/19/2012   Migraine 01/09/2012   Interstitial cystitis (chronic) without hematuria 12/20/2011   Cervical stenosis of spinal canal 08/28/2011   Meningioma (HCC) 08/28/2011   COSTOCHONDRITIS 11/16/2010   DISTURBANCES OF SENSATION OF SMELL AND TASTE 04/05/2010   NEPHROLITHIASIS, HX OF 02/08/2010   EDEMA 10/01/2009   Brachial neuritis or radiculitis NOS 06/14/2009   EXTERNAL HEMORRHOIDS 05/25/2009   MUSCLE PAIN 05/17/2009   Hyperlipidemia 04/23/2009   ABNORMAL THYROID FUNCTION TESTS 04/23/2009   MENINGIOMA 12/26/2007   HEADACHE 12/26/2007   RADICULOPATHY 04/02/2007   Essential hypertension 11/21/2006   GERD 11/21/2006    PCP: Eliane Decree, PA  REFERRING PROVIDER: Rodolph Bong  REFERRING DIAG: B ankle pain  THERAPY DIAG:  Difficulty in walking, not elsewhere classified  Pain in right ankle and joints of right foot  Pain in left ankle and joints of left foot  Rationale for Evaluation and Treatment: Rehabilitation  ONSET DATE: 2 weeks ago 07/29/23  SUBJECTIVE:   SUBJECTIVE STATEMENT: Pt reports her DPM told her she has arthritis in her foot, which is why pain so bad.  PERTINENT HISTORY: 2 week h/o  PAIN:  Are you having pain? Yes: NPRS scale: 5/10 Pain location: ankles B lateral Pain description: tender, swollen, stiff Aggravating factors: gait  Relieving factors: resting, meds, steroids  PRECAUTIONS: osteopenia  RED FLAGS: None   WEIGHT BEARING RESTRICTIONS: No  FALLS:  Has patient fallen in last 6 months? No  LIVING ENVIRONMENT: Lives with: lives with their spouse Lives in: House/apartment Stairs: Yes: External: 3 steps; on left going up Has following equipment at home: Single point cane  OCCUPATION: retired   PLOF: Independent  PATIENT GOALS: get rid of foot pain so I can walk  NEXT MD VISIT: in 1 month  OBJECTIVE: Note: Objective measures were completed at Evaluation unless otherwise noted.  DIAGNOSTIC FINDINGS: not  available  PATIENT SURVEYS:  LEFS 18/80  COGNITION: Overall cognitive status: Within functional limits for tasks assessed     SENSATION: WFL  EDEMA: mod edema B lateral malleoli   POSTURE:  varum B knees, weight bearing B forefeet  PALPATION: Point tender, exquisitely tender lateral R ankle over talocrual lig and lat border R 5th MT L ankle pt tender peroneal tendons inf to malleolus  LOWER EXTREMITY ROM:  Active ROM Right eval Left eval  Hip flexion    Hip extension    Hip abduction    Hip adduction    Hip internal rotation    Hip external rotation    Knee flexion    Knee extension    Ankle dorsiflexion 0 -18  Ankle plantarflexion 50 40  Ankle inversion 30 30  Ankle eversion nt nt   (Blank rows = not tested)  LOWER EXTREMITY MMT:  MMT Right eval Left eval  Hip flexion    Hip extension    Hip abduction    Hip adduction    Hip internal rotation    Hip external rotation    Knee flexion    Knee extension    Ankle dorsiflexion    Ankle plantarflexion    Ankle inversion    Ankle eversion     (Blank rows = not tested)  FUNCTIONAL TESTS:  Gait speed, 12 sec in 10 m, or 0.29m/sec , with st cane  GAIT: Distance walked: 33' in clinic Assistive device utilized: Single point cane Level of assistance: Modified independence Comments: slowed, avoids heel strike and toe off, foot flat contact, decreased step length B   TODAY'S TREATMENT:                                                                                                                              DATE: 08/28/23 Gait 300' no AD, supervision Nustep L4x76min UE/LE Seated heel slides x 10 BLE Seated heel raises x 20 BLE Seated PF/DF/EV/IV x 10 on slant board BLE Gastroc stretch with strap 2x30 sec bil  08/23/23:  AAROM L ankle dorsiflexion 2, R 12 Supine for retrograde massage B lower legs Gentle AP gr 1 glides subtalar jts B Gentle stretching B ankles all planes with light overpressure B  ankles   08/21/23 Manual edema resorption for B feet and ankles Subtalar joint mobs for L ankle eversion/inversion Ankle pumps in supine x 20 Heel/toe raise x 10 seated Seated PF with blue TB x 15  08/16/23: Long sitting for retrograde massage each foot and ankle Long sitting for gentle AP glides subtalar jt and subtalar distraction each foot Kinesiotaping B , 2-I pieces from tarsal heads to B gastroc heads, crossed, to provide additional support B ankles  Increased resistance for ankle pumps to blue t band, performed well  Advised pt to slighlty elevate B ankles on 2 pillows to reduce edema  08/14/23 Nustep L3x49min Seated heel raise x 10  STM to L medial arch, plantar fascia, PROM to bil ankles and feet Ankle PF RTB 2x10 bil  08/09/23: eval, gentle calcaneal glides ant and post, gr 2 , each ankle to tolerance Prone for kinesiotaping each ankle, from MT heads to med and lat gastrocs, 2 I pieces each , crossing at achilles tendon  Seated red theraband ankle pumps, guided to stay in pain free range each leg, in open chain position    PATIENT EDUCATION:  Education details: POC, goals Person educated: Patient Education method: Explanation, Demonstration, and Tactile cues Education comprehension: verbalized understanding, returned demonstration, and verbal cues required  HOME EXERCISE PROGRAM: Theraband ankle pumps  ASSESSMENT:  CLINICAL IMPRESSION: Patient had f/u with foot/ ankle specialist Monday. Noted that he thinks PT is helpful to continue. Able to continue with gentle exercises. Assessed ankle and foot pain throughout session to avoid aggravating pain.   OBJECTIVE  IMPAIRMENTS: decreased activity tolerance, decreased balance, difficulty walking, decreased ROM, decreased strength, hypomobility, increased edema, and pain.   ACTIVITY LIMITATIONS: standing, squatting, stairs, and locomotion level  PARTICIPATION LIMITATIONS: meal prep, cleaning, shopping, community  activity, and church  PERSONAL FACTORS: Behavior pattern, Past/current experiences, and Time since onset of injury/illness/exacerbation are also affecting patient's functional outcome.   REHAB POTENTIAL: Good  CLINICAL DECISION MAKING: Stable/uncomplicated  EVALUATION COMPLEXITY: Low   GOALS: Goals reviewed with patient? Yes  SHORT TERM GOALS: Target date: 2 weeks, 08/23/23 I HEP Baseline: Goal status: INITIAL    LONG TERM GOALS: Target date: 09/20/23, 6 weeks Improve AROM B ankles to [facility]  Baseline: current dorsiflexion L -18, R 0 Plantarflexion L 40 R 50 Goal status: INITIAL  2.  Improve LEFS to 40/80 Baseline: 18/80 Goal status: INITIAL  3.  Gait speed increase to greater than 1 M/sec for community ambulation Baseline:  Goal status: INITIAL   PLAN:  PT FREQUENCY: 2x/week  PT DURATION: 6 weeks  PLANNED INTERVENTIONS: 97146- PT Re-evaluation, 97110-Therapeutic exercises, 97530- Therapeutic activity, 97112- Neuromuscular re-education, 97535- Self Care, 75643- Manual therapy, and Patient/Family education  PLAN FOR NEXT SESSION: continue with gentle exercises and MT for ankles  Brisia Schuermann L Aziah Brostrom, PTA 08/28/2023, 2:54 PM

## 2023-09-03 ENCOUNTER — Ambulatory Visit: Payer: Medicare Other | Admitting: Podiatry

## 2023-09-12 ENCOUNTER — Ambulatory Visit: Payer: Medicare Other | Attending: Sports Medicine

## 2023-09-12 DIAGNOSIS — R262 Difficulty in walking, not elsewhere classified: Secondary | ICD-10-CM | POA: Insufficient documentation

## 2023-09-12 DIAGNOSIS — M25572 Pain in left ankle and joints of left foot: Secondary | ICD-10-CM | POA: Diagnosis present

## 2023-09-12 DIAGNOSIS — M25571 Pain in right ankle and joints of right foot: Secondary | ICD-10-CM | POA: Diagnosis present

## 2023-09-12 NOTE — Therapy (Signed)
OUTPATIENT PHYSICAL THERAPY LOWER EXTREMITY TREATMENT   Patient Name: Kristen Murray MRN: [id number] DOB:Apr 03, 1946, 77 y.o., female Today's Date: 09/12/2023  END OF SESSION:  PT End of Session - 09/12/23 1534     Visit Number 7    Date for PT Re-Evaluation 09/20/23    Authorization Type [employer] Medicare    Progress Note Due on Visit 10    PT Start Time 1449    PT Stop Time 1530    PT Time Calculation (min) 41 min    Activity Tolerance Patient tolerated treatment well    Behavior During Therapy WFL for tasks assessed/performed                  Past Medical History:  Diagnosis Date   Arthritis    Borderline abnormal TFTs    as a teen   CAD (coronary artery disease)    Coronary CTA 3/22: Calcium score 1 (38th percentile), minimal nonobstructive CAD with 0-24% in mid RCA; aortic atherosclerosis   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   Nephrolithiasis    Dr Logan Bores, [facility], Hx of [3][   Polyp of colon    Radiculopathy    Past Surgical History:  Procedure Laterality Date   ABDOMINAL HYSTERECTOMY     TAH/BSO --fibroids, no cancer   BALLOON DILATION N/A 07/20/2014   Procedure: BALLOON DILATION;  Surgeon: Charolett Bumpers, MD;  Location: [facility];  Service: Endoscopy;  Laterality: N/A;   BREAST BIOPSY     Right   CARPAL TUNNEL RELEASE     & trigger thumb RUE; Dr Chaney Malling    COLONOSCOPY W/ POLYPECTOMY  07/2004   Neg 2011; Dr Danise Edge   CYSTOSCOPY  11/2009   Neg   ESOPHAGOGASTRODUODENOSCOPY N/A 07/20/2014   Procedure: ESOPHAGOGASTRODUODENOSCOPY (EGD);  Surgeon: Charolett Bumpers, MD;  Location: [facility];  Service: Endoscopy;  Laterality: N/A;   FLEXIBLE BRONCHOSCOPY W/ UPPER ENDOSCOPY     neg   KNEE ARTHROSCOPY     Left   ROTATOR CUFF REPAIR     R shoulder   TONSILLECTOMY     TRIGGER FINGER RELEASE Right    Right Thumb   Patient Active Problem List   Diagnosis Date Noted   Mitral regurgitation 11/10/2022   Shortness of breath 10/04/2022   Allergic rhinitis due to pollen 05/29/2022   Cervical spondylosis without myelopathy 05/29/2022   Hyperglycemia due to type 2 diabetes mellitus (HCC) 05/29/2022   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   Perennial allergic rhinitis 05/29/2022   Pure hypercholesterolemia 05/29/2022   Simple chronic conjunctivitis 05/29/2022   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   Partial thickness rotator cuff tear 03/17/2022   Dysuria 02/28/2022   CAD (coronary artery disease)    Myelodysplasia, 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   Spinal stenosis of lumbar region 07/30/2020  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   Bilateral knee pain 05/11/2020   Atrial flutter (HCC) 01/29/2020   A-fib (HCC) 01/27/2020   Atrial flutter, paroxysmal (HCC) 01/26/2020   Lumbar spondylosis 10/27/2019   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   Iron deficiency anemia due to chronic blood loss 07/18/2019   Closed fracture of proximal phalanx of great toe 09/02/2018   Nuclear sclerosis of both eyes 08/20/2017   High risk medication use 06/15/2017   Recurrent urinary tract infection 10/14/2015   Pain in the chest    Chest pain at rest 11/26/2014   Shingles 05/14/2014   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   Vaginal atrophy 10/16/2012   Menopause 10/16/2012   Chest pain, atypical 02/19/2012   Type 2 diabetes mellitus with neurological manifestations,  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/19/2012   Migraine 01/09/2012   Interstitial cystitis (chronic) without hematuria 12/20/2011   Cervical stenosis of spinal canal 08/28/2011   Meningioma (HCC) 08/28/2011   COSTOCHONDRITIS 11/16/2010   DISTURBANCES OF SENSATION OF SMELL AND TASTE 04/05/2010   NEPHROLITHIASIS, HX OF 02/08/2010   EDEMA 10/01/2009   Brachial neuritis or radiculitis NOS 06/14/2009   EXTERNAL HEMORRHOIDS 05/25/2009   MUSCLE PAIN 05/17/2009   Hyperlipidemia 04/23/2009   ABNORMAL THYROID FUNCTION TESTS 04/23/2009   MENINGIOMA 12/26/2007   HEADACHE 12/26/2007   RADICULOPATHY 04/02/2007   Essential hypertension 11/21/2006   GERD 11/21/2006    PCP: Eliane Decree, PA  REFERRING PROVIDER: Rodolph Bong  REFERRING DIAG: B ankle pain  THERAPY DIAG:  Difficulty in walking, not elsewhere classified  Pain in right ankle and joints of right foot  Pain in left ankle and joints of left foot  Rationale for Evaluation and Treatment: Rehabilitation  ONSET DATE: 2 weeks ago 07/29/23  SUBJECTIVE:   SUBJECTIVE STATEMENT: Yesterday went to [employer] and afterwards she was hopping out of there. She denies any pain in ankles today, reports discomfort in B knees.   PERTINENT HISTORY: 2 week h/o  PAIN:  Are you having pain? No  PRECAUTIONS: osteopenia  RED FLAGS: None   WEIGHT BEARING RESTRICTIONS: No  FALLS:  Has patient fallen in last 6 months? No  LIVING ENVIRONMENT: Lives with: lives with their spouse Lives in: House/apartment Stairs: Yes: External: 3 steps; on left going up Has following equipment at home: Single point cane  OCCUPATION: retired   PLOF: Independent  PATIENT GOALS: get rid of foot pain so I can walk  NEXT MD VISIT: in 1 month  OBJECTIVE: Note: Objective measures were completed at Evaluation unless otherwise noted.  DIAGNOSTIC FINDINGS: not available  PATIENT SURVEYS:  LEFS 18/80  COGNITION: Overall cognitive status: Within functional  limits for tasks assessed     SENSATION: WFL  EDEMA: mod edema B lateral malleoli   POSTURE:  varum B knees, weight bearing B forefeet  PALPATION: Point tender, exquisitely tender lateral R ankle over talocrual lig and lat border R 5th MT L ankle pt tender peroneal tendons inf to malleolus  LOWER EXTREMITY ROM:  Active ROM Right eval Left eval R 09/12/23 L  09/12/23  Hip flexion      Hip extension      Hip abduction      Hip adduction      Hip internal rotation      Hip external rotation      Knee flexion      Knee extension      Ankle dorsiflexion 0 -18 1 5   Ankle plantarflexion 50 40    Ankle inversion 30 30 36  30  Ankle eversion nt nt 18 16    (Blank rows = not tested)  LOWER EXTREMITY MMT:  MMT Right eval Left eval  Hip flexion    Hip extension    Hip abduction    Hip adduction    Hip internal rotation    Hip external rotation    Knee flexion    Knee extension    Ankle dorsiflexion    Ankle plantarflexion    Ankle inversion    Ankle eversion     (Blank rows = not tested)  FUNCTIONAL TESTS:  Gait speed, 12 sec in 10 m, or 0.75m/sec , with st cane  GAIT: Distance walked: 81' in clinic Assistive device utilized: Single point cane Level of assistance: Modified independence Comments: slowed, avoids heel strike and toe off, foot flat contact, decreased step length B   TODAY'S TREATMENT:                                                                                                                              DATE: 09/12/23 Nustep L3x34min UE/LE Ankle ROM measured Seated PF/DF x 20 on slant board BLE Ankle DF and EV YTB x 10 each B Toe curls YTB x 10 R Seated toe flexor stretch   08/28/23 Gait 300' no AD, supervision Nustep L4x75min UE/LE Seated heel slides x 10 BLE Seated heel raises x 20 BLE Seated PF/DF/EV/IV x 10 on slant board BLE Gastroc stretch with strap 2x30 sec bil  08/23/23:  AAROM L ankle dorsiflexion 2, R 12 Supine for  retrograde massage B lower legs Gentle AP gr 1 glides subtalar jts B Gentle stretching B ankles all planes with light overpressure B ankles   08/21/23 Manual edema resorption for B feet and ankles Subtalar joint mobs for L ankle eversion/inversion Ankle pumps in supine x 20 Heel/toe raise x 10 seated Seated PF with blue TB x 15  08/16/23: Long sitting for retrograde massage each foot and ankle Long sitting for gentle AP glides subtalar jt and subtalar distraction each foot Kinesiotaping B , 2-I pieces from tarsal heads to B gastroc heads, crossed, to provide additional support B ankles  Increased resistance for ankle pumps to blue t band, performed well  Advised pt to slighlty elevate B ankles on 2 pillows to reduce edema  08/14/23 Nustep L3x15min Seated heel raise x 10  STM to L medial arch, plantar fascia, PROM to bil ankles and feet Ankle PF RTB 2x10 bil  08/09/23: eval, gentle calcaneal glides ant and post, gr 2 , each ankle to tolerance Prone for kinesiotaping each ankle, from MT heads to med and lat gastrocs, 2 I pieces each , crossing at achilles tendon  Seated red theraband ankle pumps, guided to stay in pain free range each leg, in open chain position    PATIENT EDUCATION:  Education details: POC, goals Person educated: Patient Education method: Explanation, Demonstration, and Tactile cues Education comprehension: verbalized understanding, returned demonstration,  and verbal cues required  HOME EXERCISE PROGRAM: Theraband ankle pumps  ASSESSMENT:  CLINICAL IMPRESSION: pt shows improvement in pain levels today. Shows improved mobility in B ankles. Progressed gentle strengthening for intrinsic muscles of foot/ankle. Pt progressing toward goals.   OBJECTIVE IMPAIRMENTS: decreased activity tolerance, decreased balance, difficulty walking, decreased ROM, decreased strength, hypomobility, increased edema, and pain.   ACTIVITY LIMITATIONS: standing, squatting, stairs,  and locomotion level  PARTICIPATION LIMITATIONS: meal prep, cleaning, shopping, community activity, and church  PERSONAL FACTORS: Behavior pattern, Past/current experiences, and Time since onset of injury/illness/exacerbation are also affecting patient's functional outcome.   REHAB POTENTIAL: Good  CLINICAL DECISION MAKING: Stable/uncomplicated  EVALUATION COMPLEXITY: Low   GOALS: Goals reviewed with patient? Yes  SHORT TERM GOALS: Target date: 2 weeks, 08/23/23 I HEP Baseline: Goal status: MET    LONG TERM GOALS: Target date: 09/20/23, 6 weeks Improve AROM B ankles to [facility]  Baseline: current dorsiflexion L -18, R 0 Plantarflexion L 40 R 50 Goal status: PROGRESSING- 09/12/23  2.  Improve LEFS to 40/80 Baseline: 18/80 Goal status: INITIAL  3.  Gait speed increase to greater than 1 M/sec for community ambulation Baseline:  Goal status: INITIAL   PLAN:  PT FREQUENCY: 2x/week  PT DURATION: 6 weeks  PLANNED INTERVENTIONS: 97146- PT Re-evaluation, 97110-Therapeutic exercises, 97530- Therapeutic activity, 97112- Neuromuscular re-education, 97535- Self Care, 16109- Manual therapy, and Patient/Family education  PLAN FOR NEXT SESSION: continue with gentle exercises and MT for ankles  Shellee Streng L Lidie Glade, PTA 09/12/2023, 4:04 PM

## 2023-09-20 ENCOUNTER — Ambulatory Visit: Payer: Medicare Other

## 2023-09-20 DIAGNOSIS — R262 Difficulty in walking, not elsewhere classified: Secondary | ICD-10-CM | POA: Diagnosis not present

## 2023-09-20 DIAGNOSIS — M25572 Pain in left ankle and joints of left foot: Secondary | ICD-10-CM

## 2023-09-20 DIAGNOSIS — M25571 Pain in right ankle and joints of right foot: Secondary | ICD-10-CM

## 2023-09-20 NOTE — Therapy (Signed)
OUTPATIENT PHYSICAL THERAPY LOWER EXTREMITY TREATMENT   Patient Name: Kristen Murray MRN: [id number] DOB:1945-11-03, 77 y.o., female Today's Date: 09/20/2023  END OF SESSION:  PT End of Session - 09/20/23 1402     Visit Number 8    Date for PT Re-Evaluation 09/20/23    Authorization Type [employer] Medicare    Progress Note Due on Visit 10    PT Start Time 1315    PT Stop Time 1400    PT Time Calculation (min) 45 min    Activity Tolerance Patient tolerated treatment well    Behavior During Therapy WFL for tasks assessed/performed                   Past Medical History:  Diagnosis Date   Arthritis    Borderline abnormal TFTs    as a teen   CAD (coronary artery disease)    Coronary CTA 3/22: Calcium score 1 (38th percentile), minimal nonobstructive CAD with 0-24% in mid RCA; aortic atherosclerosis   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   Nephrolithiasis    Dr Logan Bores, [facility], Hx of [3][   Polyp of colon    Radiculopathy    Past Surgical History:  Procedure Laterality Date   ABDOMINAL HYSTERECTOMY     TAH/BSO --fibroids, no cancer   BALLOON DILATION N/A 07/20/2014   Procedure: BALLOON DILATION;  Surgeon: Charolett Bumpers, MD;  Location: [facility];  Service: Endoscopy;  Laterality: N/A;   BREAST BIOPSY     Right   CARPAL TUNNEL RELEASE     & trigger thumb RUE; Dr Chaney Malling    COLONOSCOPY W/ POLYPECTOMY  07/2004   Neg 2011; Dr Danise Edge   CYSTOSCOPY  11/2009   Neg   ESOPHAGOGASTRODUODENOSCOPY N/A 07/20/2014   Procedure: ESOPHAGOGASTRODUODENOSCOPY (EGD);  Surgeon: Charolett Bumpers, MD;  Location: [facility];  Service: Endoscopy;  Laterality: N/A;   FLEXIBLE BRONCHOSCOPY W/ UPPER ENDOSCOPY     neg   KNEE ARTHROSCOPY     Left   ROTATOR CUFF REPAIR     R shoulder   TONSILLECTOMY     TRIGGER FINGER RELEASE Right    Right Thumb   Patient Active Problem List   Diagnosis Date Noted   Mitral regurgitation 11/10/2022   Shortness of breath 10/04/2022   Allergic rhinitis due to pollen 05/29/2022   Cervical spondylosis without myelopathy 05/29/2022   Hyperglycemia due to type 2 diabetes mellitus (HCC) 05/29/2022   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   Perennial allergic rhinitis 05/29/2022   Pure hypercholesterolemia 05/29/2022   Simple chronic conjunctivitis 05/29/2022   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   Partial thickness rotator cuff tear 03/17/2022   Dysuria 02/28/2022   CAD (coronary artery disease)    Myelodysplasia, 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   Spinal stenosis of lumbar region  07/30/2020   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   Bilateral knee pain 05/11/2020   Atrial flutter (HCC) 01/29/2020   A-fib (HCC) 01/27/2020   Atrial flutter, paroxysmal (HCC) 01/26/2020   Lumbar spondylosis 10/27/2019   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   Iron deficiency anemia due to chronic blood loss 07/18/2019   Closed fracture of proximal phalanx of great toe 09/02/2018   Nuclear sclerosis of both eyes 08/20/2017   High risk medication use 06/15/2017   Recurrent urinary tract infection 10/14/2015   Pain in the chest    Chest pain at rest 11/26/2014   Shingles 05/14/2014   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   Vaginal atrophy 10/16/2012   Menopause 10/16/2012   Chest pain, atypical 02/19/2012   Type 2 diabetes mellitus with neurological manifestations,  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/19/2012   Migraine 01/09/2012   Interstitial cystitis (chronic) without hematuria 12/20/2011   Cervical stenosis of spinal canal 08/28/2011   Meningioma (HCC) 08/28/2011   COSTOCHONDRITIS 11/16/2010   DISTURBANCES OF SENSATION OF SMELL AND TASTE 04/05/2010   NEPHROLITHIASIS, HX OF 02/08/2010   EDEMA 10/01/2009   Brachial neuritis or radiculitis NOS 06/14/2009   EXTERNAL HEMORRHOIDS 05/25/2009   MUSCLE PAIN 05/17/2009   Hyperlipidemia 04/23/2009   ABNORMAL THYROID FUNCTION TESTS 04/23/2009   MENINGIOMA 12/26/2007   HEADACHE 12/26/2007   RADICULOPATHY 04/02/2007   Essential hypertension 11/21/2006   GERD 11/21/2006    PCP: Eliane Decree, PA  REFERRING PROVIDER: Rodolph Bong  REFERRING DIAG: B ankle pain  THERAPY DIAG:  Difficulty in walking, not elsewhere classified  Pain in right ankle and joints of right foot  Pain in left ankle and joints of left foot  Rationale for Evaluation and Treatment: Rehabilitation  ONSET DATE: 2 weeks ago 07/29/23  SUBJECTIVE:   SUBJECTIVE STATEMENT: Saw orthopedist on Tuesday, he wants to do more PT. Reports that her did not do x-rays  PERTINENT HISTORY: 2 week h/o  PAIN:  Are you having pain? Yes: NPRS scale: 4/10 Pain location: R ankle Pain description: aching  PRECAUTIONS: osteopenia  RED FLAGS: None   WEIGHT BEARING RESTRICTIONS: No  FALLS:  Has patient fallen in last 6 months? No  LIVING ENVIRONMENT: Lives with: lives with their spouse Lives in: House/apartment Stairs: Yes: External: 3 steps; on left going up Has following equipment at home: Single point cane  OCCUPATION: retired   PLOF: Independent  PATIENT GOALS: get rid of foot pain so I can walk  NEXT MD VISIT: in 1 month  OBJECTIVE: Note: Objective measures were completed at Evaluation unless otherwise noted.  DIAGNOSTIC FINDINGS: not available  PATIENT SURVEYS:  LEFS 18/80  COGNITION: Overall cognitive status: Within  functional limits for tasks assessed     SENSATION: WFL  EDEMA: mod edema B lateral malleoli   POSTURE:  varum B knees, weight bearing B forefeet  PALPATION: Point tender, exquisitely tender lateral R ankle over talocrual lig and lat border R 5th MT L ankle pt tender peroneal tendons inf to malleolus  LOWER EXTREMITY ROM:  Active ROM Right eval Left eval R 09/12/23 L  09/12/23 R/L   Hip flexion       Hip extension       Hip abduction       Hip adduction       Hip internal rotation       Hip external rotation       Knee flexion       Knee extension       Ankle dorsiflexion 0 -18 1  5 2/5  Ankle plantarflexion 50 40   55/59  Ankle inversion 30 30 36 30 20/20  Ankle eversion nt nt 18 16  26/35   (Blank rows = not tested)  LOWER EXTREMITY MMT:  MMT Right eval Left eval  Hip flexion    Hip extension    Hip abduction    Hip adduction    Hip internal rotation    Hip external rotation    Knee flexion    Knee extension    Ankle dorsiflexion    Ankle plantarflexion    Ankle inversion    Ankle eversion     (Blank rows = not tested)  FUNCTIONAL TESTS:  Gait speed, 12 sec in 10 m, or 0.78m/sec , with st cane  GAIT: Distance walked: 34' in clinic Assistive device utilized: Single point cane Level of assistance: Modified independence Comments: slowed, avoids heel strike and toe off, foot flat contact, decreased step length B   TODAY'S TREATMENT:                                                                                                                              DATE: 09/12/23 Nustep L5x37min UE/LE LEFS: Gait Speed: 1.3 m/s Ankle AROM measured  09/12/23 Nustep L3x61min UE/LE Ankle ROM measured Seated PF/DF x 20 on slant board BLE Ankle DF and EV YTB x 10 each B Toe curls YTB x 10 R Seated toe flexor stretch   08/28/23 Gait 300' no AD, supervision Nustep L4x36min UE/LE Seated heel slides x 10 BLE Seated heel raises x 20 BLE Seated PF/DF/EV/IV x  10 on slant board BLE Gastroc stretch with strap 2x30 sec bil  08/23/23:  AAROM L ankle dorsiflexion 2, R 12 Supine for retrograde massage B lower legs Gentle AP gr 1 glides subtalar jts B Gentle stretching B ankles all planes with light overpressure B ankles   08/21/23 Manual edema resorption for B feet and ankles Subtalar joint mobs for L ankle eversion/inversion Ankle pumps in supine x 20 Heel/toe raise x 10 seated Seated PF with blue TB x 15  08/16/23: Long sitting for retrograde massage each foot and ankle Long sitting for gentle AP glides subtalar jt and subtalar distraction each foot Kinesiotaping B , 2-I pieces from tarsal heads to B gastroc heads, crossed, to provide additional support B ankles  Increased resistance for ankle pumps to blue t band, performed well  Advised pt to slighlty elevate B ankles on 2 pillows to reduce edema  08/14/23 Nustep L3x32min Seated heel raise x 10  STM to L medial arch, plantar fascia, PROM to bil ankles and feet Ankle PF RTB 2x10 bil  08/09/23: eval, gentle calcaneal glides ant and post, gr 2 , each ankle to tolerance Prone for kinesiotaping each ankle, from MT heads to med and lat gastrocs, 2 I pieces each , crossing at achilles tendon  Seated red theraband ankle pumps, guided to stay in pain free range each  leg, in open chain position    PATIENT EDUCATION:  Education details: POC, goals Person educated: Patient Education method: Explanation, Demonstration, and Tactile cues Education comprehension: verbalized understanding, returned demonstration, and verbal cues required  HOME EXERCISE PROGRAM: Theraband ankle pumps  ASSESSMENT:  CLINICAL IMPRESSION: Pt has met most of her PT goals. Gait speed and LEFS score has improved. She shows functional strength being able to cook for 45 min with no increased pain in B ankles. D/t her meeting therapy goals, with improvd pain levels, and functional abilities she is ok with 30 day hold  from PT at this time.   OBJECTIVE IMPAIRMENTS: decreased activity tolerance, decreased balance, difficulty walking, decreased ROM, decreased strength, hypomobility, increased edema, and pain.   ACTIVITY LIMITATIONS: standing, squatting, stairs, and locomotion level  PARTICIPATION LIMITATIONS: meal prep, cleaning, shopping, community activity, and church  PERSONAL FACTORS: Behavior pattern, Past/current experiences, and Time since onset of injury/illness/exacerbation are also affecting patient's functional outcome.   REHAB POTENTIAL: Good  CLINICAL DECISION MAKING: Stable/uncomplicated  EVALUATION COMPLEXITY: Low   GOALS: Goals reviewed with patient? Yes  SHORT TERM GOALS: Target date: 2 weeks, 08/23/23 I HEP Baseline: Goal status: MET    LONG TERM GOALS: Target date: 09/20/23, 6 weeks Improve AROM B ankles to [employer]  Baseline: current dorsiflexion L -18, R 0 Plantarflexion L 40 R 50 Goal status: PROGRESSING- 09/12/23  2.  Improve LEFS to 40/80 Baseline: 18/80 Goal status: - 09/20/23 Lower Extremity Functional Score: 43 / 80 = 53.8 % MET  3.  Gait speed increase to greater than 1 M/sec for community ambulation Baseline:  Goal status: MET- 09/20/23 1.80m/s   PLAN:  PT FREQUENCY: 2x/week  PT DURATION: 6 weeks  PLANNED INTERVENTIONS: 97146- PT Re-evaluation, 97110-Therapeutic exercises, 97530- Therapeutic activity, 97112- Neuromuscular re-education, 97535- Self Care, 95621- Manual therapy, and Patient/Family education  PLAN FOR NEXT SESSION: continue with gentle exercises and MT for ankles  Avelino Herren L Jessyka Austria, PTA 09/20/2023, 2:02 PM

## 2023-09-26 ENCOUNTER — Encounter: Payer: Self-pay | Admitting: Hematology & Oncology

## 2023-09-26 ENCOUNTER — Inpatient Hospital Stay: Payer: Medicare Other

## 2023-09-26 ENCOUNTER — Inpatient Hospital Stay: Payer: Medicare Other | Attending: Hematology & Oncology

## 2023-09-26 ENCOUNTER — Telehealth: Payer: Self-pay | Admitting: *Deleted

## 2023-09-26 ENCOUNTER — Inpatient Hospital Stay (HOSPITAL_BASED_OUTPATIENT_CLINIC_OR_DEPARTMENT_OTHER): Payer: Medicare Other | Admitting: Hematology & Oncology

## 2023-09-26 VITALS — BP 136/49 | HR 70 | Temp 97.7°F | Resp 18 | Ht 64.0 in | Wt 166.8 lb

## 2023-09-26 DIAGNOSIS — D72819 Decreased white blood cell count, unspecified: Secondary | ICD-10-CM

## 2023-09-26 DIAGNOSIS — D46Z Other myelodysplastic syndromes: Secondary | ICD-10-CM

## 2023-09-26 DIAGNOSIS — D4620 Refractory anemia with excess of blasts, unspecified: Secondary | ICD-10-CM | POA: Diagnosis present

## 2023-09-26 DIAGNOSIS — D500 Iron deficiency anemia secondary to blood loss (chronic): Secondary | ICD-10-CM

## 2023-09-26 LAB — CMP (CANCER CENTER ONLY)
ALT: 9 U/L (ref 0–44)
AST: 14 U/L — ABNORMAL LOW (ref 15–41)
Albumin: 3.7 g/dL (ref 3.5–5.0)
Alkaline Phosphatase: 88 U/L (ref 38–126)
Anion gap: 7 (ref 5–15)
BUN: 16 mg/dL (ref 8–23)
CO2: 29 mmol/L (ref 22–32)
Calcium: 10.1 mg/dL (ref 8.9–10.3)
Chloride: 103 mmol/L (ref 98–111)
Creatinine: 0.78 mg/dL (ref 0.44–1.00)
GFR, Estimated: >60 mL/min (ref >60)
Glucose, Bld: 213 mg/dL — ABNORMAL HIGH (ref 70–99)
Potassium: 4.1 mmol/L (ref 3.5–5.1)
Sodium: 139 mmol/L (ref 135–145)
Total Bilirubin: 0.3 mg/dL (ref <1.2)
Total Protein: 7.5 g/dL (ref 6.5–8.1)

## 2023-09-26 LAB — CBC WITH DIFFERENTIAL (CANCER CENTER ONLY)
Abs Immature Granulocytes: 0.1 10*3/uL — ABNORMAL HIGH (ref 0.00–0.07)
Basophils Absolute: 0 10*3/uL (ref 0.0–0.1)
Basophils Relative: 0 %
Eosinophils Absolute: 0 10*3/uL (ref 0.0–0.5)
Eosinophils Relative: 1 %
HCT: 33.6 % — ABNORMAL LOW (ref 36.0–46.0)
Hemoglobin: 11 g/dL — ABNORMAL LOW (ref 12.0–15.0)
Immature Granulocytes: 4 %
Lymphocytes Relative: 74 %
Lymphs Abs: 1.8 10*3/uL (ref 0.7–4.0)
MCH: 28.1 pg (ref 26.0–34.0)
MCHC: 32.7 g/dL (ref 30.0–36.0)
MCV: 85.9 fL (ref 80.0–100.0)
Monocytes Absolute: 0.3 10*3/uL (ref 0.1–1.0)
Monocytes Relative: 12 %
Neutro Abs: 0.2 10*3/uL — CL (ref 1.7–7.7)
Neutrophils Relative %: 9 %
Platelet Count: 169 10*3/uL (ref 150–400)
RBC: 3.91 MIL/uL (ref 3.87–5.11)
RDW: 17.8 % — ABNORMAL HIGH (ref 11.5–15.5)
Smear Review: NORMAL
WBC Count: 2.4 10*3/uL — ABNORMAL LOW (ref 4.0–10.5)
nRBC: 0 % (ref 0.0–0.2)

## 2023-09-26 LAB — SAVE SMEAR(SSMR), FOR PROVIDER SLIDE REVIEW

## 2023-09-26 LAB — LACTATE DEHYDROGENASE: LDH: 186 U/L (ref 98–192)

## 2023-09-26 MED ORDER — FILGRASTIM-SNDZ 480 MCG/0.8ML IJ SOSY
480.0000 ug | PREFILLED_SYRINGE | Freq: Once | INTRAMUSCULAR | Status: AC
Start: 2023-09-26 — End: 2023-09-26
  Administered 2023-09-26: 480 ug via SUBCUTANEOUS
  Filled 2023-09-26: qty 0.8

## 2023-09-26 NOTE — Progress Notes (Signed)
Hematology and Oncology Follow Up Visit  LEDDY CARDIFF [id number] Jan 05, 1946 77 y.o. 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 Burdine is here today for follow-up.  She is doing pretty well.  She is getting ready for Thanksgiving.  It sounds like she will be at some friends house.  She has been seen by surgery.  They are just going to watch the lymph node over on the left side.  I can agree with this.  She has little bit of swelling in the lower legs.  This could certainly be from medications.  Her blood sugars are still on the high side.  Today, her blood sugar is 213.  Her last iron studies that were done back in October showed a ferritin of 1247 with an iron saturation of 18%.  She has had no infections.  She has had no fever.  She has had no bleeding.  There is been no change in bowel or bladder habits.  She has had no cough or shortness of breath.  Thankfully, there is been no problems with COVID.  Overall, I would have to say that her performance status is probably ECOG 1.   Medications:  Allergies as of 09/26/2023       Reactions   Codeine Hives, Other (See Comments)   Because of a history of documented adverse serious drug reaction, Medi Alert bracelet  is recommended   Macrodantin [nitrofurantoin Macrocrystal] Other (See Comments)   Numbness, aching all over   Other Shortness Of Breath, Rash, Other (See Comments)   Pine nuts & walnuts throat congestion. No documented angioedema.   Propoxyphene Hives, Swelling, Rash, Other (See Comments)   Propoxyphene Hcl Hives, Swelling, Rash   Dulaglutide Other (See Comments)   Other reaction(s): weak, ha, no appetite   Latex Rash, Other (See Comments)   Metformin  Hcl Other (See Comments)   Other reaction(s): leg discomfort   Neosporin [bacitracin-polymyxin B] Other (See Comments)   Blisters   Zoster Vac Recomb Adjuvanted Rash, Other (See Comments)   Other reaction(s): confusion   Augmentin [amoxicillin-pot Clavulanate] Itching   Avocado Rash   Banana Rash   Ciprofloxacin Nausea Only, Other (See Comments)   Nausea (Note: Patient takes this when on mission trips, however)   Tizanidine Other (See Comments)   Reaction not recalled   Tramadol Nausea Only        Medication List        Accurate as of September 26, 2023  3:13 PM. If you have any questions, ask your nurse or doctor.          STOP taking these medications    GaviLyte-G 236 g solution Generic drug: polyethylene glycol Stopped by: Josph Macho       TAKE these medications    acetaminophen 325 MG tablet Commonly known as: TYLENOL Take 325-650 mg by mouth every 6 (six) hours as needed (for pain).   ALPRAZolam 0.25 MG tablet Commonly known as: XANAX Take 0.25 mg by mouth 2 (two) times daily as needed.   azelastine 0.1 % nasal spray Commonly known as: ASTELIN Place into both nostrils 2 (two) times daily. Use in each nostril as directed  b complex vitamins capsule daily.   cetirizine 10 MG tablet Commonly known as: ZYRTEC Take 1 tablet (10 mg total) by mouth daily as needed for allergies (Can take an extra dose during flare ups.).   Cholecalciferol 25 MCG (1000 UT) capsule Take 1,000 Units by mouth daily.   clotrimazole-betamethasone cream Commonly known as: Lotrisone Apply 1 Application topically 2 (two) times daily. Use for 2 weeks as needed.  Apply to area twice weekly at bedtime for maintenance dosing.   cyclobenzaprine 10 MG tablet Commonly known as: FLEXERIL Take 2.5 mg by mouth daily as needed for muscle spasms.   dextromethorphan-guaiFENesin 30-600 MG 12hr tablet Commonly known as: MUCINEX DM Take 1 tablet by mouth 2 (two) times daily.    diclofenac 0.1 % ophthalmic solution Commonly known as: VOLTAREN 4 (four) times daily as needed.   DropSafe Alcohol Prep 70 % Pads Apply topically.   Eliquis 5 MG Tabs tablet Generic drug: apixaban TAKE 1 TABLET BY MOUTH TWICE  DAILY   EPINEPHrine 0.3 mg/0.3 mL Soaj injection Commonly known as: EPI-PEN Inject 0.3 mg into the muscle as needed for anaphylaxis.   fluorometholone 0.1 % ophthalmic suspension Commonly known as: FML Place 1 drop into both eyes every Monday, Wednesday, and Friday.   fluticasone 50 MCG/ACT nasal spray Commonly known as: FLONASE Place 1 spray into both nostrils 2 (two) times daily.   folic acid 400 MCG tablet Commonly known as: FOLVITE daily.   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   glipiZIDE 10 MG 24 hr tablet Commonly known as: GLUCOTROL XL Take 15 mg by mouth daily. Takes 15 mg   glucose blood test strip Inject 1 strip into the skin daily.   glucose blood test strip Commonly known as: FREESTYLE LITE CHECK BLOOD SUGAR ONCE DAILY AS DIRECTED.  DX:790.29   ipratropium 0.06 % nasal spray Commonly known as: ATROVENT Place 2 sprays into both nostrils 3 (three) times daily.   lidocaine 5 % ointment Commonly known as: XYLOCAINE Apply 1 application topically 3 (three) times daily as needed.   loratadine 10 MG tablet Commonly known as: CLARITIN Take 1 tablet (10 mg total) by mouth daily.   metoprolol tartrate 50 MG tablet Commonly known as: LOPRESSOR TAKE 1 AND 1/2 TABLETS TWICE DAILY   montelukast 10 MG tablet Commonly known as: SINGULAIR TAKE 1 TABLET BY MOUTH AT  BEDTIME   multivitamin tablet Take 1 tablet by mouth daily. Shaklee brand   nitroGLYCERIN 0.4 MG SL tablet Commonly known as: NITROSTAT Place 1 tablet (0.4 mg total) under the tongue every 5 (five) minutes as needed for chest pain.    pantoprazole 40 MG tablet Commonly known as: PROTONIX Take 1 tablet (40 mg total) by mouth 2 (two) times daily.   rosuvastatin 5 MG tablet Commonly known as: CRESTOR Take 5 mg by mouth See admin instructions. Take 5 mg by mouth at bedtime on Mondays and Thursdays..Currently on hold   traMADol 50 MG tablet Commonly known as: ULTRAM Take 1 tablet 4 times a day by oral route as needed.   tretinoin 0.025 % cream Commonly known as: RETIN-A Apply 1 application. topically at bedtime.   trolamine salicylate 10 % cream Commonly known as: ASPERCREME Apply 1 application topically as needed for muscle pain.   valACYclovir 500 MG tablet Commonly known as: VALTREX Take 1 tablet (500 mg total) by  mouth 2 (two) times daily. Take twice a day for thee days as needed for an outbreak.   verapamil 180 MG CR tablet Commonly known as: CALAN-SR Take 180 mg by mouth at bedtime.        Allergies:  Allergies  Allergen Reactions   Codeine Hives and Other (See Comments)    Because of a history of documented adverse serious drug reaction, Medi Alert bracelet  is recommended   Macrodantin [Nitrofurantoin Macrocrystal] Other (See Comments)    Numbness, aching all over   Other Shortness Of Breath, Rash and Other (See Comments)    Pine nuts & walnuts throat congestion. No documented angioedema.    Propoxyphene Hives, Swelling, Rash and Other (See Comments)   Propoxyphene Hcl Hives, Swelling and Rash   Dulaglutide Other (See Comments)    Other reaction(s): weak, ha, no appetite   Latex Rash and Other (See Comments)   Metformin Hcl Other (See Comments)    Other reaction(s): leg discomfort   Neosporin [Bacitracin-Polymyxin B] Other (See Comments)    Blisters   Zoster Vac Recomb Adjuvanted Rash and Other (See Comments)    Other reaction(s): confusion   Augmentin [Amoxicillin-Pot Clavulanate] Itching   Avocado Rash   Banana Rash   Ciprofloxacin Nausea Only and Other (See Comments)    Nausea  (Note: Patient takes this when on mission trips, however)   Tizanidine Other (See Comments)    Reaction not recalled   Tramadol Nausea Only    Past Medical History, Surgical history, Social history, and Family History were reviewed and updated.  Review of Systems: Review of Systems  Constitutional: Negative.   HENT: Negative.    Eyes: Negative.   Respiratory: Negative.    Cardiovascular: Negative.   Gastrointestinal: Negative.   Genitourinary: Negative.   Musculoskeletal: Negative.   Skin: Negative.   Neurological: Negative.   Endo/Heme/Allergies: Negative.   Psychiatric/Behavioral: Negative.        Physical Exam:  height is 5\' 4"  (1.626 m) and weight is 166 lb 12.8 oz (75.7 kg). Her oral temperature is 97.7 F (36.5 C). Her blood pressure is 136/49 (abnormal) and her pulse is 70. Her respiration is 18 and oxygen saturation is 100%.   Wt Readings from Last 3 Encounters:  09/26/23 166 lb 12.8 oz (75.7 kg)  08/13/23 165 lb (74.8 kg)  08/07/23 167 lb (75.8 kg)    Physical Exam Vitals reviewed.  HENT:     Head: Normocephalic and atraumatic.  Eyes:     Pupils: Pupils are equal, round, and reactive to light.  Cardiovascular:     Rate and Rhythm: Normal rate and regular rhythm.     Heart sounds: Normal heart sounds.  Pulmonary:     Effort: Pulmonary effort is normal.     Breath sounds: Normal breath sounds.  Abdominal:     General: Bowel sounds are normal.     Palpations: Abdomen is soft.  Musculoskeletal:        General: No tenderness or deformity. Normal range of motion.     Cervical back: Normal range of motion.  Lymphadenopathy:     Cervical: No cervical adenopathy.  Skin:    General: Skin is warm and dry.     Findings: No erythema or rash.  Neurological:     Mental Status: She is alert and oriented to person, place, and time.  Psychiatric:        Behavior: Behavior normal.        Thought Content: Thought content normal.  Judgment: Judgment normal.      Lab Results  Component Value Date   WBC 2.4 (L) 09/26/2023   HGB 11.0 (L) 09/26/2023   HCT 33.6 (L) 09/26/2023   MCV 85.9 09/26/2023   PLT 169 09/26/2023   Lab Results  Component Value Date   FERRITIN 1,247 (H) 08/07/2023   IRON 43 08/07/2023   TIBC 246 (L) 08/07/2023   UIBC 203 08/07/2023   IRONPCTSAT 18 08/07/2023   Lab Results  Component Value Date   RETICCTPCT 2.1 08/07/2023   RBC 3.91 09/26/2023   No results found for: "KPAFRELGTCHN", "LAMBDASER", "KAPLAMBRATIO" No results found for: "IGGSERUM", "IGA", "IGMSERUM" No results found for: "TOTALPROTELP", "ALBUMINELP", "A1GS", "A2GS", "BETS", "BETA2SER", "GAMS", "MSPIKE", "SPEI"   Chemistry      Component Value Date/Time   NA 137 08/13/2023 1518   NA 138 03/10/2020 1138   K 3.8 08/13/2023 1518   CL 103 08/13/2023 1518   CO2 27 08/13/2023 1518   BUN 16 08/13/2023 1518   BUN 10 03/10/2020 1138   CREATININE 0.73 08/13/2023 1518      Component Value Date/Time   CALCIUM 9.4 08/13/2023 1518   ALKPHOS 93 08/13/2023 1518   AST 10 (L) 08/13/2023 1518   ALT 33 08/13/2023 1518   BILITOT 0.3 08/13/2023 1518      No diagnosis found.   Impression and Plan: Ms. Bing is a very pleasant 77 yo African American female with low-grade MDS, low IPSS-R score.   We will go ahead and give her some Neulasta today.  She does not need any Aranesp.  Hopefully, we will be able to get her white cell count up a little bit more.  We still have to be cautious of the lymph node.  Again, seems like surgery is helping to monitor this.  Organ to keep following her closely.  We will plan to get her back to see [place] in about a month.  I want to see her back before Christmas just to make sure that her counts are okay.   Josph Macho, MD 11/27/20243:13 PM   .

## 2023-09-26 NOTE — Telephone Encounter (Signed)
Lab brought to my attention a critical lab value ANC is <0.5. Results given to MD.

## 2023-10-01 ENCOUNTER — Telehealth: Payer: Self-pay

## 2023-10-01 NOTE — Telephone Encounter (Signed)
Received phone call from patient stating that since she received her shot on 09/26/2023 her sternum has been sore. Pt states the soreness is located on her sternum and some mild back pain. Pt states pain is worse with movement but has improved each day. Pt states she has taken tylenol as needed to help with pain. Pt states she does take zyrtec daily. Pt educated that pegfilgastrim shots can cause bone pain as the bone marrow is stimulated to make white blood cells. Pt denies the pain radiating anywhere or changing in description of "soreness" Pt aware that if the pain changes or increases or spreads to head to the nearest ER or urgent care. Pt verbalized understanding and had no further questions.

## 2023-10-22 ENCOUNTER — Inpatient Hospital Stay: Payer: Medicare Other | Attending: Hematology & Oncology

## 2023-10-22 ENCOUNTER — Inpatient Hospital Stay: Payer: Medicare Other

## 2023-10-22 ENCOUNTER — Inpatient Hospital Stay (HOSPITAL_BASED_OUTPATIENT_CLINIC_OR_DEPARTMENT_OTHER): Payer: Medicare Other | Admitting: Family

## 2023-10-22 ENCOUNTER — Telehealth: Payer: Self-pay

## 2023-10-22 ENCOUNTER — Encounter: Payer: Self-pay | Admitting: Family

## 2023-10-22 VITALS — BP 138/52 | HR 69 | Temp 97.6°F | Resp 18 | Ht 64.0 in | Wt 167.1 lb

## 2023-10-22 DIAGNOSIS — D4620 Refractory anemia with excess of blasts, unspecified: Secondary | ICD-10-CM | POA: Insufficient documentation

## 2023-10-22 DIAGNOSIS — K909 Intestinal malabsorption, unspecified: Secondary | ICD-10-CM | POA: Diagnosis not present

## 2023-10-22 DIAGNOSIS — D500 Iron deficiency anemia secondary to blood loss (chronic): Secondary | ICD-10-CM | POA: Diagnosis not present

## 2023-10-22 DIAGNOSIS — D72819 Decreased white blood cell count, unspecified: Secondary | ICD-10-CM | POA: Diagnosis not present

## 2023-10-22 DIAGNOSIS — D46Z Other myelodysplastic syndromes: Secondary | ICD-10-CM

## 2023-10-22 DIAGNOSIS — Z79899 Other long term (current) drug therapy: Secondary | ICD-10-CM | POA: Diagnosis not present

## 2023-10-22 DIAGNOSIS — D508 Other iron deficiency anemias: Secondary | ICD-10-CM | POA: Diagnosis not present

## 2023-10-22 LAB — CBC WITH DIFFERENTIAL (CANCER CENTER ONLY)
Abs Immature Granulocytes: 0.1 10*3/uL — ABNORMAL HIGH (ref 0.00–0.07)
Basophils Absolute: 0 10*3/uL (ref 0.0–0.1)
Basophils Relative: 0 %
Eosinophils Absolute: 0 10*3/uL (ref 0.0–0.5)
Eosinophils Relative: 1 %
HCT: 34.4 % — ABNORMAL LOW (ref 36.0–46.0)
Hemoglobin: 11 g/dL — ABNORMAL LOW (ref 12.0–15.0)
Immature Granulocytes: 4 %
Lymphocytes Relative: 72 %
Lymphs Abs: 1.6 10*3/uL (ref 0.7–4.0)
MCH: 27.7 pg (ref 26.0–34.0)
MCHC: 32 g/dL (ref 30.0–36.0)
MCV: 86.6 fL (ref 80.0–100.0)
Monocytes Absolute: 0.4 10*3/uL (ref 0.1–1.0)
Monocytes Relative: 15 %
Neutro Abs: 0.2 10*3/uL — CL (ref 1.7–7.7)
Neutrophils Relative %: 8 %
Platelet Count: 176 10*3/uL (ref 150–400)
RBC: 3.97 MIL/uL (ref 3.87–5.11)
RDW: 17.6 % — ABNORMAL HIGH (ref 11.5–15.5)
WBC Count: 2.4 10*3/uL — ABNORMAL LOW (ref 4.0–10.5)
nRBC: 0 % (ref 0.0–0.2)

## 2023-10-22 LAB — FERRITIN: Ferritin: 839 ng/mL — ABNORMAL HIGH (ref 11–307)

## 2023-10-22 LAB — CMP (CANCER CENTER ONLY)
ALT: 13 U/L (ref 0–44)
AST: 15 U/L (ref 15–41)
Albumin: 3.9 g/dL (ref 3.5–5.0)
Alkaline Phosphatase: 86 U/L (ref 38–126)
Anion gap: 6 (ref 5–15)
BUN: 13 mg/dL (ref 8–23)
CO2: 31 mmol/L (ref 22–32)
Calcium: 9.8 mg/dL (ref 8.9–10.3)
Chloride: 101 mmol/L (ref 98–111)
Creatinine: 0.71 mg/dL (ref 0.44–1.00)
GFR, Estimated: >60 mL/min (ref >60)
Glucose, Bld: 232 mg/dL — ABNORMAL HIGH (ref 70–99)
Potassium: 4.5 mmol/L (ref 3.5–5.1)
Sodium: 138 mmol/L (ref 135–145)
Total Bilirubin: 0.3 mg/dL (ref <1.2)
Total Protein: 7.6 g/dL (ref 6.5–8.1)

## 2023-10-22 LAB — RETICULOCYTES
Immature Retic Fract: 16.5 % — ABNORMAL HIGH (ref 2.3–15.9)
RBC.: 3.97 MIL/uL (ref 3.87–5.11)
Retic Count, Absolute: 33.3 10*3/uL (ref 19.0–186.0)
Retic Ct Pct: 0.8 % (ref 0.4–3.1)

## 2023-10-22 LAB — IRON AND IRON BINDING CAPACITY (CC-WL,HP ONLY)
Iron: 29 ug/dL (ref 28–170)
Saturation Ratios: 13 % (ref 10.4–31.8)
TIBC: 231 ug/dL — ABNORMAL LOW (ref 250–450)
UIBC: 202 ug/dL (ref 148–442)

## 2023-10-22 NOTE — Telephone Encounter (Signed)
Advised via MyChart.

## 2023-10-22 NOTE — Progress Notes (Signed)
Critical result received from lab of ANC 0.2 Kristen Stanford NP aware and pt to receive her filgrastim shot on 10/25/2023.

## 2023-10-22 NOTE — Progress Notes (Signed)
Hematology and Oncology Follow Up Visit  Kristen Murray [id number] May 03, 1946 77 y.o. 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 She is doing well and has no complaints at this time.  No issue with fever, chills, n/v, cough, rash, dizziness, SOB, chest pain, palpitations, abdominal pain or changes in bowel or bladder habits.  No blood loss or bruising noted. No petechiae.  No swelling, tenderness, numbness or tingling in her extremities.  No falls or syncope reported.  Appetite and hydration are good. Weight is stable at 167 lbs.   ECOG Performance Status: 1 - Symptomatic but completely ambulatory  Medications:  Allergies as of 10/22/2023       Reactions   Codeine Hives, Other (See Comments)   Because of a history of documented adverse serious drug reaction, Medi Alert bracelet  is recommended   Macrodantin [nitrofurantoin Macrocrystal] Other (See Comments)   Numbness, aching all over   Other Shortness Of Breath, Rash, Other (See Comments)   Pine nuts & walnuts throat congestion. No documented angioedema.   Propoxyphene Hives, Swelling, Rash, Other (See Comments)   Propoxyphene Hcl Hives, Swelling, Rash   Dulaglutide Other (See Comments)   Other reaction(s): weak, ha, no appetite   Latex Rash, Other (See Comments)   Metformin Hcl Other (See Comments)   Other reaction(s): leg discomfort   Neosporin [bacitracin-polymyxin B] Other (See Comments)   Blisters   Zoster Vac Recomb Adjuvanted Rash, Other (See Comments)   Other reaction(s): confusion   Augmentin [amoxicillin-pot Clavulanate] Itching   Avocado Rash   Banana Rash   Ciprofloxacin Nausea Only, Other (See Comments)    Nausea (Note: Patient takes this when on mission trips, however)   Tizanidine Other (See Comments)   Reaction not recalled   Tramadol Nausea Only        Medication List        Accurate as of October 22, 2023 12:13 PM. If you have any questions, ask your nurse or doctor.          acetaminophen 325 MG tablet Commonly known as: TYLENOL Take 325-650 mg by mouth every 6 (six) hours as needed (for pain).   ALPRAZolam 0.25 MG tablet Commonly known as: XANAX Take 0.25 mg by mouth 2 (two) times daily as needed.   azelastine 0.1 % nasal spray Commonly known as: ASTELIN Place into both nostrils 2 (two) times daily. Use in each nostril as directed   b complex vitamins capsule daily.   cetirizine 10 MG tablet Commonly known as: ZYRTEC Take 1 tablet (10 mg total) by mouth daily as needed for allergies (Can take an extra dose during flare ups.).   Cholecalciferol 25 MCG (1000 UT) capsule Take 1,000 Units by mouth daily.   clotrimazole-betamethasone cream Commonly known as: Lotrisone Apply 1 Application topically 2 (two) times daily. Use for 2 weeks as needed.  Apply to area twice weekly at bedtime for maintenance dosing.   cyclobenzaprine 10 MG tablet Commonly known as: FLEXERIL Take 2.5 mg by mouth daily as needed for muscle spasms.   dextromethorphan-guaiFENesin 30-600 MG 12hr tablet Commonly known as: MUCINEX DM Take 1 tablet by mouth 2 (two)  times daily.   diclofenac 0.1 % ophthalmic solution Commonly known as: VOLTAREN 4 (four) times daily as needed.   DropSafe Alcohol Prep 70 % Pads Apply topically.   Eliquis 5 MG Tabs tablet Generic drug: apixaban TAKE 1 TABLET BY MOUTH TWICE  DAILY   EPINEPHrine 0.3 mg/0.3 mL Soaj injection Commonly known as: EPI-PEN Inject 0.3 mg into the muscle as needed for anaphylaxis.   fluorometholone 0.1 % ophthalmic suspension Commonly known as: FML Place 1 drop into both eyes every Monday, Wednesday, and Friday.   fluticasone  50 MCG/ACT nasal spray Commonly known as: FLONASE Place 1 spray into both nostrils 2 (two) times daily.   folic acid 400 MCG tablet Commonly known as: FOLVITE daily.   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   glipiZIDE 10 MG 24 hr tablet Commonly known as: GLUCOTROL XL Take 15 mg by mouth daily. Takes 15 mg   glucose blood test strip Inject 1 strip into the skin daily.   glucose blood test strip Commonly known as: FREESTYLE LITE CHECK BLOOD SUGAR ONCE DAILY AS DIRECTED.  DX:790.29   ipratropium 0.06 % nasal spray Commonly known as: ATROVENT Place 2 sprays into both nostrils 3 (three) times daily.   lidocaine 5 % ointment Commonly known as: XYLOCAINE Apply 1 application topically 3 (three) times daily as needed.   loratadine 10 MG tablet Commonly known as: CLARITIN Take 1 tablet (10 mg total) by mouth daily.   metoprolol tartrate 50 MG tablet Commonly known as: LOPRESSOR TAKE 1 AND 1/2 TABLETS TWICE DAILY   montelukast 10 MG tablet Commonly known as: SINGULAIR TAKE 1 TABLET BY MOUTH AT  BEDTIME   multivitamin tablet Take 1 tablet by mouth daily. Shaklee brand   nitroGLYCERIN 0.4 MG SL tablet Commonly known as: NITROSTAT Place 1 tablet (0.4 mg total) under the tongue every 5 (five) minutes as needed for chest pain.   pantoprazole 40 MG tablet Commonly known as: PROTONIX Take 1 tablet (40 mg total) by mouth 2 (two) times daily.   rosuvastatin 5 MG tablet Commonly known as: CRESTOR Take 5 mg by mouth See admin instructions. Take 5 mg by mouth at bedtime on Mondays and Thursdays..Currently on hold   traMADol 50 MG tablet Commonly known as: ULTRAM Take 1 tablet 4 times a day by oral route as needed.   tretinoin 0.025 % cream Commonly known as: RETIN-A Apply 1 application. topically at bedtime.   trolamine salicylate 10 %  cream Commonly known as: ASPERCREME Apply 1 application topically as needed for muscle pain.   valACYclovir 500 MG tablet Commonly known as: VALTREX Take 1 tablet (500 mg total) by mouth 2 (two) times daily. Take twice a day for thee days as needed for an outbreak.   verapamil 180 MG CR tablet Commonly known as: CALAN-SR Take 180 mg by mouth at bedtime.        Allergies:  Allergies  Allergen Reactions   Codeine Hives and Other (See Comments)    Because of a history of documented adverse serious drug reaction, Medi Alert bracelet  is recommended   Macrodantin [Nitrofurantoin Macrocrystal] Other (See Comments)    Numbness, aching all over   Other Shortness Of Breath, Rash and Other (See Comments)    Pine nuts & walnuts throat congestion. No documented angioedema.    Propoxyphene Hives, Swelling, Rash and Other (See  Comments)   Propoxyphene Hcl Hives, Swelling and Rash   Dulaglutide Other (See Comments)    Other reaction(s): weak, ha, no appetite   Latex Rash and Other (See Comments)   Metformin Hcl Other (See Comments)    Other reaction(s): leg discomfort   Neosporin [Bacitracin-Polymyxin B] Other (See Comments)    Blisters   Zoster Vac Recomb Adjuvanted Rash and Other (See Comments)    Other reaction(s): confusion   Augmentin [Amoxicillin-Pot Clavulanate] Itching   Avocado Rash   Banana Rash   Ciprofloxacin Nausea Only and Other (See Comments)    Nausea (Note: Patient takes this when on mission trips, however)   Tizanidine Other (See Comments)    Reaction not recalled   Tramadol Nausea Only    Past Medical History, Surgical history, Social history, and Family History were reviewed and updated.  Review of Systems: All other 10 point review of systems is negative.   Physical Exam:  vitals were not taken for this visit.   Wt Readings from Last 3 Encounters:  09/26/23 166 lb 12.8 oz (75.7 kg)  08/13/23 165 lb (74.8 kg)  08/07/23 167 lb (75.8 kg)    Ocular:  Sclerae unicteric, pupils equal, round and reactive to light Ear-nose-throat: Oropharynx clear, dentition fair Lymphatic: No cervical or supraclavicular adenopathy Lungs no rales or rhonchi, good excursion bilaterally Heart regular rate and rhythm, no murmur appreciated Abd soft, nontender, positive bowel sounds MSK no focal spinal tenderness, no joint edema Neuro: non-focal, well-oriented, appropriate affect Breasts: Deferred   Lab Results  Component Value Date   WBC 2.4 (L) 09/26/2023   HGB 11.0 (L) 09/26/2023   HCT 33.6 (L) 09/26/2023   MCV 85.9 09/26/2023   PLT 169 09/26/2023   Lab Results  Component Value Date   FERRITIN 1,247 (H) 08/07/2023   IRON 43 08/07/2023   TIBC 246 (L) 08/07/2023   UIBC 203 08/07/2023   IRONPCTSAT 18 08/07/2023   Lab Results  Component Value Date   RETICCTPCT 2.1 08/07/2023   RBC 3.91 09/26/2023   No results found for: "KPAFRELGTCHN", "LAMBDASER", "KAPLAMBRATIO" No results found for: "IGGSERUM", "IGA", "IGMSERUM" No results found for: "TOTALPROTELP", "ALBUMINELP", "A1GS", "A2GS", "BETS", "BETA2SER", "GAMS", "MSPIKE", "SPEI"   Chemistry      Component Value Date/Time   NA 139 09/26/2023 1444   NA 138 03/10/2020 1138   K 4.1 09/26/2023 1444   CL 103 09/26/2023 1444   CO2 29 09/26/2023 1444   BUN 16 09/26/2023 1444   BUN 10 03/10/2020 1138   CREATININE 0.78 09/26/2023 1444      Component Value Date/Time   CALCIUM 10.1 09/26/2023 1444   ALKPHOS 88 09/26/2023 1444   AST 14 (L) 09/26/2023 1444   ALT 9 09/26/2023 1444   BILITOT 0.3 09/26/2023 1444       Impression and Plan: Kristen Murray is a very pleasant 77 yo African American female with low-grade MDS, low IPSS-R score.  WBC count is 2.4 and ANC 0.2. She would prefer to wait until the day after Christmas for her Zarxio injection. She will take her claritin the morning of to help with body aches.  Iron studies are pending. We will replace if needed.  No Nplate are ESA needed this visit.   We will see her again in 4 weeks.   Eileen Stanford, NP 12/23/202412:13 PM

## 2023-10-25 ENCOUNTER — Inpatient Hospital Stay: Payer: Medicare Other

## 2023-10-25 VITALS — BP 141/65 | HR 71 | Temp 97.9°F | Resp 20

## 2023-10-25 DIAGNOSIS — D72819 Decreased white blood cell count, unspecified: Secondary | ICD-10-CM

## 2023-10-25 DIAGNOSIS — D4620 Refractory anemia with excess of blasts, unspecified: Secondary | ICD-10-CM | POA: Diagnosis not present

## 2023-10-25 DIAGNOSIS — D500 Iron deficiency anemia secondary to blood loss (chronic): Secondary | ICD-10-CM

## 2023-10-25 DIAGNOSIS — D46Z Other myelodysplastic syndromes: Secondary | ICD-10-CM

## 2023-10-25 MED ORDER — FILGRASTIM-SNDZ 480 MCG/0.8ML IJ SOSY
480.0000 ug | PREFILLED_SYRINGE | Freq: Once | INTRAMUSCULAR | Status: AC
  Administered 2023-10-25: 480 ug via SUBCUTANEOUS
  Filled 2023-10-25: qty 0.8

## 2023-10-25 NOTE — Patient Instructions (Signed)
Filgrastim Injection What is this medication? FILGRASTIM (fil GRA stim) lowers the risk of infection in people who are receiving chemotherapy. It works by helping your body make more white blood cells, which protects your body from infection. It may also be used to help people who have been exposed to high doses of radiation. It can be used to help prepare your body before a stem cell transplant. It works by helping your bone marrow make and release stem cells into the blood. This medicine may be used for other purposes; ask your health care provider or pharmacist if you have questions. COMMON BRAND NAME(S): Neupogen, Nivestym, Releuko, Zarxio What should I tell my care team before I take this medication? They need to know if you have any of these conditions: History of blood diseases, such as sickle cell anemia Kidney disease Recent or ongoing radiation An unusual or allergic reaction to filgrastim, pegfilgrastim, latex, rubber, other medications, foods, dyes, or preservatives Pregnant or trying to get pregnant Breast-feeding How should I use this medication? This medication is injected under the skin or into a vein. It is usually given by your care team in a hospital or clinic setting. It may be given at home. If you get this medication at home, you will be taught how to prepare and give it. Use exactly as directed. Take it as directed on the prescription label at the same time every day. Keep taking it unless your care team tells you to stop. It is important that you put your used needles and syringes in a special sharps container. Do not put them in a trash can. If you do not have a sharps container, call your pharmacist or care team to get one. This medication comes with INSTRUCTIONS FOR USE. Ask your pharmacist for directions on how to use this medication. Read the information carefully. Talk to your pharmacist or care team if you have questions. Talk to your care team about the use of this  medication in children. While it may be prescribed for children for selected conditions, precautions do apply. Overdosage: If you think you have taken too much of this medicine contact a poison control center or emergency room at once. NOTE: This medicine is only for you. Do not share this medicine with others. What if I miss a dose? It is important not to miss any doses. Talk to your care team about what to do if you miss a dose. What may interact with this medication? Medications that may cause a release of neutrophils, such as lithium This list may not describe all possible interactions. Give your health care provider a list of all the medicines, herbs, non-prescription drugs, or dietary supplements you use. Also tell them if you smoke, drink alcohol, or use illegal drugs. Some items may interact with your medicine. What should I watch for while using this medication? Your condition will be monitored carefully while you are receiving this medication. You may need bloodwork while taking this medication. Talk to your care team about your risk of cancer. You may be more at risk for certain types of cancer if you take this medication. What side effects may I notice from receiving this medication? Side effects that you should report to your care team as soon as possible: Allergic reactions--skin rash, itching, hives, swelling of the face, lips, tongue, or throat Capillary leak syndrome--stomach or muscle pain, unusual weakness or fatigue, feeling faint or lightheaded, decrease in the amount of urine, swelling of the ankles, hands, or   feet, trouble breathing High white blood cell level--fever, fatigue, trouble breathing, night sweats, change in vision, weight loss Inflammation of the aorta--fever, fatigue, back, chest, or stomach pain, severe headache Kidney injury (glomerulonephritis)--decrease in the amount of urine, red or dark brown urine, foamy or bubbly urine, swelling of the ankles, hands, or  feet Shortness of breath or trouble breathing Spleen injury--pain in upper left stomach or shoulder Unusual bruising or bleeding Side effects that usually do not require medical attention (report to your care team if they continue or are bothersome): Back pain Bone pain Fatigue Fever Headache Nausea This list may not describe all possible side effects. Call your doctor for medical advice about side effects. You may report side effects to FDA at 1-800-FDA-1088. Where should I keep my medication? Keep out of the reach of children and pets. Keep this medication in the original packaging until you are ready to take it. Protect from light. See product for storage information. Each product may have different instructions. Get rid of any unused medication after the expiration date. To get rid of medications that are no longer needed or have expired: Take the medication to a medications take-back program. Check with your pharmacy or law enforcement to find a location. If you cannot return the medication, ask your pharmacist or care team how to get rid of this medication safely. NOTE: This sheet is a summary. It may not cover all possible information. If you have questions about this medicine, talk to your doctor, pharmacist, or health care provider.  2024 Elsevier/Gold Standard (2022-03-09 00:00:00)  

## 2023-10-30 ENCOUNTER — Telehealth: Payer: Self-pay | Admitting: Hematology & Oncology

## 2023-10-30 NOTE — Telephone Encounter (Signed)
 Called to schedule 2 doses of IV Iron per inbasket. LVM to return call for scheduling.

## 2023-11-05 ENCOUNTER — Inpatient Hospital Stay: Payer: Medicare Other | Attending: Hematology & Oncology

## 2023-11-05 VITALS — BP 153/66 | HR 67 | Temp 97.7°F | Resp 18

## 2023-11-05 DIAGNOSIS — D72819 Decreased white blood cell count, unspecified: Secondary | ICD-10-CM

## 2023-11-05 DIAGNOSIS — D500 Iron deficiency anemia secondary to blood loss (chronic): Secondary | ICD-10-CM

## 2023-11-05 DIAGNOSIS — D46Z Other myelodysplastic syndromes: Secondary | ICD-10-CM

## 2023-11-05 DIAGNOSIS — D508 Other iron deficiency anemias: Secondary | ICD-10-CM | POA: Diagnosis not present

## 2023-11-05 DIAGNOSIS — D4620 Refractory anemia with excess of blasts, unspecified: Secondary | ICD-10-CM | POA: Insufficient documentation

## 2023-11-05 DIAGNOSIS — K909 Intestinal malabsorption, unspecified: Secondary | ICD-10-CM | POA: Diagnosis not present

## 2023-11-05 MED ORDER — SODIUM CHLORIDE 0.9 % IV SOLN
INTRAVENOUS | Status: DC
Start: 2023-11-05 — End: 2023-11-05

## 2023-11-05 MED ORDER — IRON SUCROSE 20 MG/ML IV SOLN
200.0000 mg | Freq: Once | INTRAVENOUS | Status: AC
Start: 2023-11-05 — End: 2023-11-05
  Administered 2023-11-05: 200 mg via INTRAVENOUS
  Filled 2023-11-05: qty 10

## 2023-11-05 NOTE — Patient Instructions (Signed)
 Iron Sucrose Injection What is this medication? IRON SUCROSE (EYE ern SOO krose) treats low levels of iron (iron deficiency anemia) in people with kidney disease. Iron is a mineral that plays an important role in making red blood cells, which carry oxygen from your lungs to the rest of your body. This medicine may be used for other purposes; ask your health care provider or pharmacist if you have questions. COMMON BRAND NAME(S): Venofer What should I tell my care team before I take this medication? They need to know if you have any of these conditions: Anemia not caused by low iron levels Heart disease High levels of iron in the blood Kidney disease Liver disease An unusual or allergic reaction to iron, other medications, foods, dyes, or preservatives Pregnant or trying to get pregnant Breastfeeding How should I use this medication? This medication is for infusion into a vein. It is given in a hospital or clinic setting. Talk to your care team about the use of this medication in children. While this medication may be prescribed for children as young as 2 years for selected conditions, precautions do apply. Overdosage: If you think you have taken too much of this medicine contact a poison control center or emergency room at once. NOTE: This medicine is only for you. Do not share this medicine with others. What if I miss a dose? Keep appointments for follow-up doses. It is important not to miss your dose. Call your care team if you are unable to keep an appointment. What may interact with this medication? Do not take this medication with any of the following: Deferoxamine Dimercaprol Other iron products This medication may also interact with the following: Chloramphenicol Deferasirox This list may not describe all possible interactions. Give your health care provider a list of all the medicines, herbs, non-prescription drugs, or dietary supplements you use. Also tell them if you smoke,  drink alcohol, or use illegal drugs. Some items may interact with your medicine. What should I watch for while using this medication? Visit your care team regularly. Tell your care team if your symptoms do not start to get better or if they get worse. You may need blood work done while you are taking this medication. You may need to follow a special diet. Talk to your care team. Foods that contain iron include: whole grains/cereals, dried fruits, beans, or peas, leafy green vegetables, and organ meats (liver, kidney). What side effects may I notice from receiving this medication? Side effects that you should report to your care team as soon as possible: Allergic reactions--skin rash, itching, hives, swelling of the face, lips, tongue, or throat Low blood pressure--dizziness, feeling faint or lightheaded, blurry vision Shortness of breath Side effects that usually do not require medical attention (report to your care team if they continue or are bothersome): Flushing Headache Joint pain Muscle pain Nausea Pain, redness, or irritation at injection site This list may not describe all possible side effects. Call your doctor for medical advice about side effects. You may report side effects to FDA at 1-800-FDA-1088. Where should I keep my medication? This medication is given in a hospital or clinic. It will not be stored at home. NOTE: This sheet is a summary. It may not cover all possible information. If you have questions about this medicine, talk to your doctor, pharmacist, or health care provider.  2024 Elsevier/Gold Standard (2023-03-23 00:00:00)

## 2023-11-08 ENCOUNTER — Ambulatory Visit (HOSPITAL_COMMUNITY): Payer: Medicare Other | Attending: Cardiology

## 2023-11-08 DIAGNOSIS — I340 Nonrheumatic mitral (valve) insufficiency: Secondary | ICD-10-CM | POA: Insufficient documentation

## 2023-11-08 LAB — ECHOCARDIOGRAM COMPLETE
Area-P 1/2: 3.65 cm2
S' Lateral: 2.3 cm

## 2023-11-12 ENCOUNTER — Ambulatory Visit: Payer: Medicare Other | Admitting: Family Medicine

## 2023-11-12 NOTE — Patient Instructions (Addendum)
  1. Continue avoidance - tree nuts, pine nuts, banana, avocado  2. Continue to Treat and prevent inflammation:   A. Flonase  1-2 sprays each nostril one time per day  B. montelukast  10 mg tablet once a day  3. Continue to Treat LPR:   A. Continue to consolidate all caffeine and chocolate use  B. Continue to replace throat clearing with swallowing maneuver  4. If needed:   A. nasal saline  B. OTC antihistamine - Cetirizine  (Zyrtec )  C. Epi-Pen  D. Can add OTC Mucinex  two times per day  E.  Ipratropium 0.06% 1-2 sprays each nostril every 6 hours to dry nose (try using this more consistently to see if this helps with runny nose and drainage down throat. Caution as this can be drying.  5. Obtain fall flu vaccine and RSV vaccine  6. If you have chest pain again recommend calling 911 rather than taking Claritin   Return to clinic in 2-3 months or earlier if problem

## 2023-11-13 ENCOUNTER — Other Ambulatory Visit: Payer: Self-pay

## 2023-11-13 ENCOUNTER — Encounter: Payer: Self-pay | Admitting: Family

## 2023-11-13 ENCOUNTER — Ambulatory Visit: Payer: Medicare Other | Admitting: Family

## 2023-11-13 VITALS — BP 124/50 | HR 74 | Temp 98.2°F | Resp 16 | Ht 63.39 in | Wt 165.1 lb

## 2023-11-13 DIAGNOSIS — T7805XD Anaphylactic reaction due to tree nuts and seeds, subsequent encounter: Secondary | ICD-10-CM | POA: Diagnosis not present

## 2023-11-13 DIAGNOSIS — J3089 Other allergic rhinitis: Secondary | ICD-10-CM

## 2023-11-13 DIAGNOSIS — K219 Gastro-esophageal reflux disease without esophagitis: Secondary | ICD-10-CM

## 2023-11-13 MED ORDER — CETIRIZINE HCL 10 MG PO TABS: 10.0000 mg | ORAL_TABLET | Freq: Every day | ORAL | 3 refills | Status: DC | PRN

## 2023-11-13 MED ORDER — EPINEPHRINE 0.3 MG/0.3ML IJ SOAJ: 0.3000 mg | INTRAMUSCULAR | 1 refills | Status: DC | PRN

## 2023-11-13 NOTE — Progress Notes (Signed)
 [address] [place] [address] Dept: [phone]  FOLLOW UP NOTE  Patient ID: Kristen Murray, female    DOB: Sep 08, 1946  Age: 78 y.o. MRN: [id number] Date of Office Visit: 11/13/2023  Assessment  Chief Complaint: Follow-up (Constant nasal drip which leads to coughing. Cough is dry. Clear mucous.)  HPI Kristen Murray is a 78 year old female who presents today for an acute visit of postnasal drip that causes her to cough and clear rhinorrhea.  She was last seen on May 30, 2022 by Dr. 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  She denies any new diagnosis or surgery since her last office visit.  Allergic rhinitis: She reports that for a long time that she has had post nasal drip that drives her crazy and causes her to cough.  At times she will blow her nose a lot.  What is coming from her nose is clear.  She reports that she feels fine and denies any fever or chills.  She reports that she does not ever hear rattling in her chest.  At times the drainage will slow up and then will start back up again.  She will alternate between taking Claritin  or Zyrtec .  She reports that she has fluticasone  nasal spray and ipratropium bromide  nasal spray.  She reports that she forgets to use the ipratropium bromide  nasal spray.  She thinks that when she has used ipratropium in the past that it has helped with the drainage and drippy nose.  She reports nasal congestion this Saturday and Sunday and denies any sinus pressure.  She did have a headache last week and has had a little 1 today that she attributes to being at the dentist office for an hour.  The headache is above her right eye.  She has not been treated for any sinus infections since we last saw her.  She also reports that she has used Claritin  before when having chest pain and surprisingly it helped with her chest  pain.  Laryngopharyngeal reflux disease is reported as being good.  She reports since she stopped eating what caused it she has not had any symptoms.  She denies any heartburn or reflux symptoms.  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  She also reports that due to her bladder problems her urologist put her on a strict diet.     Drug Allergies:  Allergies  Allergen Reactions   Codeine Hives and Other (See Comments)    Because of a history of documented adverse serious drug reaction, Medi Alert bracelet  is recommended   Macrodantin  [Nitrofurantoin  Macrocrystal] Other (See Comments)    Numbness, aching all over   Other Shortness Of Breath, Rash and Other (See Comments)    Pine nuts & walnuts throat congestion. No documented angioedema.    Propoxyphene Hives, Swelling, Rash and Other (See Comments)   Propoxyphene Hcl Hives, Swelling and Rash   Augmentin  [Amoxicillin -Pot Clavulanate] Itching   Dulaglutide Other (See Comments)    Other reaction(s): weak, ha, no appetite   Latex Rash and Other (See Comments)   Metformin  Hcl Other (See Comments)    Other reaction(s): leg discomfort   Neosporin [Bacitracin-Polymyxin B] Other (See Comments)    Blisters   Zoster Vac Recomb Adjuvanted Rash and Other (See Comments)    Other reaction(s): confusion  Avocado Rash   Banana Rash   Ciprofloxacin  Nausea Only and Other (See Comments)    Nausea (Note: Patient takes this when on mission trips, however)   Tizanidine Other (See Comments)    Reaction not recalled   Tramadol  Nausea Only    Review of Systems: Negative except as per HPI   Physical Exam: BP (!) 124/50   Pulse 74   Temp 98.2 F (36.8 C) (Temporal)   Resp 16   Ht 5' 3.39 (1.61 m)   Wt 165 lb 1.6 oz (74.9 kg)   LMP 07/30/1990   SpO2 98%   BMI 28.89 kg/m    Physical Exam Constitutional:      Appearance: Normal  appearance.  HENT:     Head: Normocephalic and atraumatic.     Comments: Pharynx normal, eyes normal, ears: Right ear normal, left ear: Unable to see tympanic membrane due to cerumen.  Nose: Bilateral lower turbinates mildly edematous with no drainage noted    Right Ear: Tympanic membrane, ear canal and external ear normal.     Left Ear: Ear canal and external ear normal.     Mouth/Throat:     Mouth: Mucous membranes are moist.     Pharynx: Oropharynx is clear.  Eyes:     Conjunctiva/sclera: Conjunctivae normal.  Cardiovascular:     Rate and Rhythm: Regular rhythm.     Heart sounds: Normal heart sounds.  Pulmonary:     Effort: Pulmonary effort is normal.     Breath sounds: Normal breath sounds.     Comments: Lungs clear to auscultation Musculoskeletal:     Cervical back: Neck supple.  Skin:    General: Skin is warm.  Neurological:     Mental Status: She is alert and oriented to person, place, and time.  Psychiatric:        Mood and Affect: Mood normal.        Behavior: Behavior normal.        Thought Content: Thought content normal.        Judgment: Judgment normal.     Diagnostics:  None  Assessment and Plan: 1. Other allergic rhinitis   2. LPRD (laryngopharyngeal reflux disease)   3. 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   Patient Instructions   1. Continue avoidance - tree nuts, pine nuts, banana, avocado  2. Continue to Treat and prevent inflammation:   A. Flonase  1-2 sprays each nostril one time per day  B. montelukast  10 mg tablet once a day  3. Continue to Treat LPR:   A. Continue to consolidate all caffeine and chocolate use  B. Continue to replace throat clearing with swallowing maneuver  4. If needed:   A. nasal saline  B. OTC antihistamine - Cetirizine  (Zyrtec )  C. Epi-Pen  D. Can add OTC Mucinex  two times per day  E.  Ipratropium 0.06% 1-2 sprays  each nostril every 6 hours to dry nose (try using this more consistently to see if this helps with runny nose and drainage down throat. Caution as this can be drying.  5. Obtain fall flu vaccine and RSV vaccine  6. If you have chest pain again recommend calling 911 rather than taking Claritin   Return to clinic in 2-3 months or earlier if problem        Return in about 3 months (around 02/11/2024), or if symptoms worsen or fail to  improve.    Thank you for the opportunity to care for this patient.  Please do not hesitate to contact me with questions.  Wanda Craze, FNP [facility] 

## 2023-11-19 ENCOUNTER — Telehealth: Payer: Self-pay

## 2023-11-19 ENCOUNTER — Inpatient Hospital Stay: Payer: Medicare Other

## 2023-11-19 ENCOUNTER — Inpatient Hospital Stay: Payer: Medicare Other | Admitting: Family

## 2023-11-19 ENCOUNTER — Other Ambulatory Visit: Payer: Self-pay | Admitting: Allergy and Immunology

## 2023-11-19 ENCOUNTER — Encounter: Payer: Self-pay | Admitting: Family

## 2023-11-19 VITALS — BP 139/56 | HR 80 | Temp 98.0°F | Resp 16 | Wt 165.0 lb

## 2023-11-19 DIAGNOSIS — D72819 Decreased white blood cell count, unspecified: Secondary | ICD-10-CM

## 2023-11-19 DIAGNOSIS — D46Z Other myelodysplastic syndromes: Secondary | ICD-10-CM

## 2023-11-19 DIAGNOSIS — D500 Iron deficiency anemia secondary to blood loss (chronic): Secondary | ICD-10-CM | POA: Diagnosis not present

## 2023-11-19 DIAGNOSIS — D4620 Refractory anemia with excess of blasts, unspecified: Secondary | ICD-10-CM

## 2023-11-19 DIAGNOSIS — D709 Neutropenia, unspecified: Secondary | ICD-10-CM

## 2023-11-19 LAB — RETICULOCYTES
Immature Retic Fract: 14 % (ref 2.3–15.9)
RBC.: 4.13 MIL/uL (ref 3.87–5.11)
Retic Count, Absolute: 64 10*3/uL (ref 19.0–186.0)
Retic Ct Pct: 1.6 % (ref 0.4–3.1)

## 2023-11-19 LAB — CMP (CANCER CENTER ONLY)
ALT: 16 U/L (ref 0–44)
AST: 20 U/L (ref 15–41)
Albumin: 3.9 g/dL (ref 3.5–5.0)
Alkaline Phosphatase: 88 U/L (ref 38–126)
Anion gap: 8 (ref 5–15)
BUN: 15 mg/dL (ref 8–23)
CO2: 28 mmol/L (ref 22–32)
Calcium: 9.8 mg/dL (ref 8.9–10.3)
Chloride: 100 mmol/L (ref 98–111)
Creatinine: 0.69 mg/dL (ref 0.44–1.00)
GFR, Estimated: >60 mL/min (ref >60)
Glucose, Bld: 286 mg/dL — ABNORMAL HIGH (ref 70–99)
Potassium: 4.4 mmol/L (ref 3.5–5.1)
Sodium: 136 mmol/L (ref 135–145)
Total Bilirubin: 0.4 mg/dL (ref 0.0–1.2)
Total Protein: 8 g/dL (ref 6.5–8.1)

## 2023-11-19 LAB — CBC WITH DIFFERENTIAL (CANCER CENTER ONLY)
Abs Immature Granulocytes: 0.06 10*3/uL (ref 0.00–0.07)
Basophils Absolute: 0 10*3/uL (ref 0.0–0.1)
Basophils Relative: 1 %
Eosinophils Absolute: 0 10*3/uL (ref 0.0–0.5)
Eosinophils Relative: 1 %
HCT: 35.4 % — ABNORMAL LOW (ref 36.0–46.0)
Hemoglobin: 11.5 g/dL — ABNORMAL LOW (ref 12.0–15.0)
Immature Granulocytes: 3 %
Lymphocytes Relative: 71 %
Lymphs Abs: 1.4 10*3/uL (ref 0.7–4.0)
MCH: 27.8 pg (ref 26.0–34.0)
MCHC: 32.5 g/dL (ref 30.0–36.0)
MCV: 85.5 fL (ref 80.0–100.0)
Monocytes Absolute: 0.2 10*3/uL (ref 0.1–1.0)
Monocytes Relative: 12 %
Neutro Abs: 0.2 10*3/uL — CL (ref 1.7–7.7)
Neutrophils Relative %: 12 %
Platelet Count: 195 10*3/uL (ref 150–400)
RBC: 4.14 MIL/uL (ref 3.87–5.11)
RDW: 17.9 % — ABNORMAL HIGH (ref 11.5–15.5)
WBC Count: 2.2 10*3/uL — ABNORMAL LOW (ref 4.0–10.5)
nRBC: 0 % (ref 0.0–0.2)

## 2023-11-19 LAB — IRON AND IRON BINDING CAPACITY (CC-WL,HP ONLY)
Iron: 33 ug/dL (ref 28–170)
Saturation Ratios: 14 % (ref 10.4–31.8)
TIBC: 232 ug/dL — ABNORMAL LOW (ref 250–450)
UIBC: 199 ug/dL (ref 148–442)

## 2023-11-19 LAB — FERRITIN: Ferritin: 1106 ng/mL — ABNORMAL HIGH (ref 11–307)

## 2023-11-19 NOTE — Telephone Encounter (Signed)
Critical abs of 0.2 received from lab, Kristen Murray informed

## 2023-11-19 NOTE — Progress Notes (Signed)
Hematology and Oncology Follow Up Visit  Kristen Murray [id number] 1946/02/21 78 y.o. 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 She is doing well but states that she felt a little lightheaded this morning when she first got up.  She denies any issues with infection.  No fever, chills, n/v, cough, rash, dizziness, SOB, chest pain, palpitations, abdominal pain or changes in bowel or bladder habits.  WBC count is 2.2, ANC 0.2, Hgb 11.5 and platelets 195.  No blood loss noted. No bruising or petechiae.  No swelling, tenderness, numbness or tingling in her extremities.  No falls or syncope.  Appetite and hydration are good. Weight is stable at 165 lbs.   ECOG Performance Status: 1 - Symptomatic but completely ambulatory  Medications:  Allergies as of 11/19/2023       Reactions   Codeine Hives, Other (See Comments)   Because of a history of documented adverse serious drug reaction, Medi Alert bracelet  is recommended   Macrodantin [nitrofurantoin Macrocrystal] Other (See Comments)   Numbness, aching all over   Other Shortness Of Breath, Rash, Other (See Comments)   Pine nuts & walnuts throat congestion. No documented angioedema.   Propoxyphene Hives, Swelling, Rash, Other (See Comments)   Propoxyphene Hcl Hives, Swelling, Rash   Augmentin [amoxicillin-pot Clavulanate] Itching   Dulaglutide Other (See Comments)   Other reaction(s): weak, ha, no appetite   Latex Rash, Other (See Comments)   Metformin Hcl Other (See Comments)   Other reaction(s): leg discomfort   Neosporin [bacitracin-polymyxin B] Other (See Comments)   Blisters   Zoster Vac Recomb Adjuvanted Rash, Other (See Comments)    Other reaction(s): confusion   Avocado Rash   Banana Rash   Ciprofloxacin Nausea Only, Other (See Comments)   Nausea (Note: Patient takes this when on mission trips, however)   Tizanidine Other (See Comments)   Reaction not recalled   Tramadol Nausea Only        Medication List        Accurate as of November 19, 2023  1:09 PM. If you have any questions, ask your nurse or doctor.          acetaminophen 325 MG tablet Commonly known as: TYLENOL Take 325-650 mg by mouth every 6 (six) hours as needed (for pain).   ALPRAZolam 0.25 MG tablet Commonly known as: XANAX Take 0.25 mg by mouth 2 (two) times daily as needed.   azelastine 0.1 % nasal spray Commonly known as: ASTELIN Place into both nostrils 2 (two) times daily. Use in each nostril as directed   b complex vitamins capsule daily.   cetirizine 10 MG tablet Commonly known as: ZYRTEC Take 1 tablet (10 mg total) by mouth daily as needed for allergies (Can take an extra dose during flare ups.).   Cholecalciferol 25 MCG (1000 UT) capsule Take 1,000 Units by mouth daily.   clotrimazole-betamethasone cream Commonly known as: Lotrisone Apply 1 Application topically 2 (two) times daily. Use for 2 weeks as needed.  Apply to area twice weekly at bedtime for maintenance dosing.   cyclobenzaprine 10 MG tablet Commonly known as: FLEXERIL Take 2.5 mg by  mouth daily as needed for muscle spasms.   dextromethorphan-guaiFENesin 30-600 MG 12hr tablet Commonly known as: MUCINEX DM Take 1 tablet by mouth 2 (two) times daily.   diclofenac 0.1 % ophthalmic solution Commonly known as: VOLTAREN 4 (four) times daily as needed.   DropSafe Alcohol Prep 70 % Pads Apply topically.   Eliquis 5 MG Tabs tablet Generic drug: apixaban TAKE 1 TABLET BY MOUTH TWICE  DAILY   EPINEPHrine 0.3 mg/0.3 mL Soaj injection Commonly known as: EPI-PEN Inject 0.3 mg into the muscle as needed for anaphylaxis.   fluorometholone 0.1 % ophthalmic  suspension Commonly known as: FML Place 1 drop into both eyes every Monday, Wednesday, and Friday.   fluticasone 50 MCG/ACT nasal spray Commonly known as: FLONASE Place 1 spray into both nostrils 2 (two) times daily.   folic acid 400 MCG tablet Commonly known as: FOLVITE daily.   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   glipiZIDE 10 MG 24 hr tablet Commonly known as: GLUCOTROL XL Take 15 mg by mouth daily. Takes 15 mg   glucose blood test strip Inject 1 strip into the skin daily.   glucose blood test strip Commonly known as: FREESTYLE LITE CHECK BLOOD SUGAR ONCE DAILY AS DIRECTED.  DX:790.29   ipratropium 0.06 % nasal spray Commonly known as: ATROVENT Place 2 sprays into both nostrils 3 (three) times daily.   Lantus SoloStar 100 UNIT/ML Solostar Pen Generic drug: insulin glargine Inject into the skin.   levothyroxine 25 MCG tablet Commonly known as: SYNTHROID Take 25 mcg by mouth daily.   lidocaine 5 % ointment Commonly known as: XYLOCAINE Apply 1 application topically 3 (three) times daily as needed.   loratadine 10 MG tablet Commonly known as: CLARITIN Take 1 tablet (10 mg total) by mouth daily.   metoprolol tartrate 50 MG tablet Commonly known as: LOPRESSOR TAKE 1 AND 1/2 TABLETS TWICE DAILY   montelukast 10 MG tablet Commonly known as: SINGULAIR TAKE 1 TABLET BY MOUTH AT  BEDTIME   multivitamin tablet Take 1 tablet by mouth daily. Shaklee brand   nitroGLYCERIN 0.4 MG SL tablet Commonly known as: NITROSTAT Place 1 tablet (0.4 mg total) under the tongue every 5 (five) minutes as needed for chest pain.   pantoprazole 40 MG tablet Commonly known as: PROTONIX Take 1 tablet (40 mg total) by mouth 2 (two) times daily.   rosuvastatin 5 MG tablet Commonly known as: CRESTOR Take 5 mg by mouth See admin instructions. Take  5 mg by mouth at bedtime on Mondays and Thursdays..Currently on hold   traMADol 50 MG tablet Commonly known as: ULTRAM Take 1 tablet 4 times a day by oral route as needed.   tretinoin 0.025 % cream Commonly known as: RETIN-A Apply 1 application. topically at bedtime.   trolamine salicylate 10 % cream Commonly known as: ASPERCREME Apply 1 application topically as needed for muscle pain.   valACYclovir 500 MG tablet Commonly known as: VALTREX Take 1 tablet (500 mg total) by mouth 2 (two) times daily. Take twice a day for thee days as needed for an outbreak.   verapamil 180 MG CR tablet Commonly known as: CALAN-SR Take 180 mg by mouth at bedtime.        Allergies:  Allergies  Allergen Reactions   Codeine Hives and Other (See Comments)    Because of a history of documented  adverse serious drug reaction, Medi Alert bracelet  is recommended   Macrodantin [Nitrofurantoin Macrocrystal] Other (See Comments)    Numbness, aching all over   Other Shortness Of Breath, Rash and Other (See Comments)    Pine nuts & walnuts throat congestion. No documented angioedema.    Propoxyphene Hives, Swelling, Rash and Other (See Comments)   Propoxyphene Hcl Hives, Swelling and Rash   Augmentin [Amoxicillin-Pot Clavulanate] Itching   Dulaglutide Other (See Comments)    Other reaction(s): weak, ha, no appetite   Latex Rash and Other (See Comments)   Metformin Hcl Other (See Comments)    Other reaction(s): leg discomfort   Neosporin [Bacitracin-Polymyxin B] Other (See Comments)    Blisters   Zoster Vac Recomb Adjuvanted Rash and Other (See Comments)    Other reaction(s): confusion   Avocado Rash   Banana Rash   Ciprofloxacin Nausea Only and Other (See Comments)    Nausea (Note: Patient takes this when on mission trips, however)   Tizanidine Other (See Comments)    Reaction not recalled   Tramadol Nausea Only    Past Medical History, Surgical history, Social history, and Family History  were reviewed and updated.  Review of Systems: All other 10 point review of systems is negative.   Physical Exam:  vitals were not taken for this visit.   Wt Readings from Last 3 Encounters:  11/13/23 165 lb 1.6 oz (74.9 kg)  10/22/23 167 lb 1.9 oz (75.8 kg)  09/26/23 166 lb 12.8 oz (75.7 kg)    Ocular: Sclerae unicteric, pupils equal, round and reactive to light Ear-nose-throat: Oropharynx clear, dentition fair Lymphatic: No cervical or supraclavicular adenopathy Lungs no rales or rhonchi, good excursion bilaterally Heart regular rate and rhythm, no murmur appreciated Abd soft, nontender, positive bowel sounds MSK no focal spinal tenderness, no joint edema Neuro: non-focal, well-oriented, appropriate affect Breasts: Deferred   Lab Results  Component Value Date   WBC 2.4 (L) 10/22/2023   HGB 11.0 (L) 10/22/2023   HCT 34.4 (L) 10/22/2023   MCV 86.6 10/22/2023   PLT 176 10/22/2023   Lab Results  Component Value Date   FERRITIN 839 (H) 10/22/2023   IRON 29 10/22/2023   TIBC 231 (L) 10/22/2023   UIBC 202 10/22/2023   IRONPCTSAT 13 10/22/2023   Lab Results  Component Value Date   RETICCTPCT 0.8 10/22/2023   RBC 3.97 10/22/2023   No results found for: "KPAFRELGTCHN", "LAMBDASER", "KAPLAMBRATIO" No results found for: "IGGSERUM", "IGA", "IGMSERUM" No results found for: "TOTALPROTELP", "ALBUMINELP", "A1GS", "A2GS", "BETS", "BETA2SER", "GAMS", "MSPIKE", "SPEI"   Chemistry      Component Value Date/Time   NA 138 10/22/2023 1205   NA 138 03/10/2020 1138   K 4.5 10/22/2023 1205   CL 101 10/22/2023 1205   CO2 31 10/22/2023 1205   BUN 13 10/22/2023 1205   BUN 10 03/10/2020 1138   CREATININE 0.71 10/22/2023 1205      Component Value Date/Time   CALCIUM 9.8 10/22/2023 1205   ALKPHOS 86 10/22/2023 1205   AST 15 10/22/2023 1205   ALT 13 10/22/2023 1205   BILITOT 0.3 10/22/2023 1205       Impression and Plan: Kristen Murray is a very pleasant 78 yo African American  female with low-grade MDS, low IPSS-R score.  WBC count is 2.2, ANC 0.2. She remains asymptomatic at this time.  Iron studies are pending. We will replace if needed.  No Nplate or ESA needed this visit.  We will see her  again in 4 weeks.   Eileen Stanford, NP 1/20/20251:09 PM

## 2023-11-26 ENCOUNTER — Encounter: Payer: Self-pay | Admitting: Podiatry

## 2023-11-26 ENCOUNTER — Ambulatory Visit: Payer: Medicare Other | Admitting: Podiatry

## 2023-11-26 ENCOUNTER — Other Ambulatory Visit: Payer: Self-pay | Admitting: Cardiovascular Disease

## 2023-11-26 DIAGNOSIS — M79674 Pain in right toe(s): Secondary | ICD-10-CM | POA: Diagnosis not present

## 2023-11-26 DIAGNOSIS — B351 Tinea unguium: Secondary | ICD-10-CM | POA: Diagnosis not present

## 2023-11-26 DIAGNOSIS — E119 Type 2 diabetes mellitus without complications: Secondary | ICD-10-CM

## 2023-11-26 DIAGNOSIS — M79675 Pain in left toe(s): Secondary | ICD-10-CM

## 2023-11-26 NOTE — Progress Notes (Signed)
Chief Complaint  Patient presents with   Nail Problem    Patient is here for [facility]     SUBJECTIVE Patient with a history of diabetes mellitus presents to office today complaining of elongated, thickened nails that cause pain while ambulating in shoes.  Patient is unable to trim their own nails.   Patient states that she continues to have some slight tenderness with swelling to the ankles bilaterally.  The injections only helped minimally.  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   Nephrolithiasis    Dr Logan Bores, [facility], Hx of [3][   Polyp of colon    Radiculopathy     Allergies  Allergen Reactions   Codeine Hives and Other (See Comments)    Because of a history of documented adverse serious drug reaction, Medi Alert bracelet  is recommended   Macrodantin [Nitrofurantoin Macrocrystal] Other (See Comments)    Numbness, aching all over   Other Shortness Of Breath, Rash and Other (See Comments)    Pine nuts & walnuts throat congestion. No documented angioedema.    Propoxyphene Hives, Swelling, Rash and Other (See Comments)   Propoxyphene  Hcl Hives, Swelling and Rash   Augmentin [Amoxicillin-Pot Clavulanate] Itching   Dulaglutide Other (See Comments)    Other reaction(s): weak, ha, no appetite   Latex Rash and Other (See Comments)   Metformin Hcl Other (See Comments)    Other reaction(s): leg discomfort   Neosporin [Bacitracin-Polymyxin B] Other (See Comments)    Blisters   Zoster Vac Recomb Adjuvanted Rash and Other (See Comments)    Other reaction(s): confusion   Avocado Rash   Banana Rash   Ciprofloxacin Nausea Only and Other (See Comments)    Nausea (Note: Patient takes this when on mission trips, however)   Tizanidine Other (See Comments)    Reaction not recalled   Tramadol Nausea Only     OBJECTIVE General Patient is awake, alert, and oriented x 3 and in no acute distress. Derm Skin is dry and supple bilateral. Negative open lesions or macerations. Remaining integument unremarkable. Nails are tender, long, thickened and dystrophic with subungual debris, consistent with onychomycosis, 1-5 bilateral. No signs of infection noted. Vasc  DP and PT pedal pulses palpable bilaterally. Temperature gradient within normal limits.  There is some mild edema noted to the bilateral foot and ankles Neuro light touch and protective threshold sensation diminished bilaterally.  Musculoskeletal Exam range of motion WNL.  Muscle strength 5/5 all compartments.  There is to be some tenderness with palpation noted  to the bilateral ankle joints Radiographic exam B/L ankles 06/04/2023 diffuse osteoporosis noted.  Joint spaces mostly preserved.  Tibiotalar joint congruent.  There is some slight joint space narrowing ankle mortise view left along the lateral gutter.  No acute fractures identified.  ASSESSMENT 1. Diabetes Mellitus w/ peripheral neuropathy 2.  Pain due to onychomycosis of toenails bilateral 3.  Capsulitis/arthritis with mild edema bilateral ankles 4.  Encounter for diabetic foot exam  PLAN OF CARE -Patient evaluated today.   Comprehensive diabetic foot exam performed today -Instructed to maintain good pedal hygiene and foot care. Stressed importance of controlling blood sugar.  -Mechanical debridement of nails 1-5 bilaterally performed using a nail nipper. Filed with dremel without incident.  - Patient states that the injections last visit did not significantly help.  No injections administered today - Compression ankle sleeves dispensed.  Wear daily -Return to clinic 3 months routine footcare    Felecia Shelling, DPM [facility]  Dr. Felecia Shelling, DPM    [address] [place], [place] [address]                Office [phone]  Fax [phone]

## 2023-12-17 ENCOUNTER — Encounter: Payer: Self-pay | Admitting: Family

## 2023-12-17 ENCOUNTER — Inpatient Hospital Stay: Payer: Medicare Other

## 2023-12-17 ENCOUNTER — Inpatient Hospital Stay: Payer: Medicare Other | Attending: Hematology & Oncology

## 2023-12-17 ENCOUNTER — Inpatient Hospital Stay: Payer: Medicare Other | Admitting: Family

## 2023-12-17 VITALS — BP 160/66 | HR 54 | Resp 17 | Wt 170.0 lb

## 2023-12-17 DIAGNOSIS — D500 Iron deficiency anemia secondary to blood loss (chronic): Secondary | ICD-10-CM

## 2023-12-17 DIAGNOSIS — D72819 Decreased white blood cell count, unspecified: Secondary | ICD-10-CM | POA: Diagnosis not present

## 2023-12-17 DIAGNOSIS — K909 Intestinal malabsorption, unspecified: Secondary | ICD-10-CM | POA: Insufficient documentation

## 2023-12-17 DIAGNOSIS — D4620 Refractory anemia with excess of blasts, unspecified: Secondary | ICD-10-CM | POA: Insufficient documentation

## 2023-12-17 DIAGNOSIS — D709 Neutropenia, unspecified: Secondary | ICD-10-CM

## 2023-12-17 DIAGNOSIS — D46Z Other myelodysplastic syndromes: Secondary | ICD-10-CM | POA: Diagnosis not present

## 2023-12-17 DIAGNOSIS — D508 Other iron deficiency anemias: Secondary | ICD-10-CM | POA: Insufficient documentation

## 2023-12-17 LAB — IRON AND IRON BINDING CAPACITY (CC-WL,HP ONLY)
Iron: 85 ug/dL (ref 28–170)
Saturation Ratios: 29 % (ref 10.4–31.8)
TIBC: 295 ug/dL (ref 250–450)
UIBC: 210 ug/dL (ref 148–442)

## 2023-12-17 LAB — RETICULOCYTES
Immature Retic Fract: 10.9 % (ref 2.3–15.9)
RBC.: 3.91 MIL/uL (ref 3.87–5.11)
Retic Count, Absolute: 63.7 10*3/uL (ref 19.0–186.0)
Retic Ct Pct: 1.6 % (ref 0.4–3.1)

## 2023-12-17 LAB — CBC WITH DIFFERENTIAL (CANCER CENTER ONLY)
Abs Immature Granulocytes: 0.04 10*3/uL (ref 0.00–0.07)
Basophils Absolute: 0 10*3/uL (ref 0.0–0.1)
Basophils Relative: 0 %
Eosinophils Absolute: 0 10*3/uL (ref 0.0–0.5)
Eosinophils Relative: 0 %
HCT: 34.1 % — ABNORMAL LOW (ref 36.0–46.0)
Hemoglobin: 11.1 g/dL — ABNORMAL LOW (ref 12.0–15.0)
Immature Granulocytes: 1 %
Lymphocytes Relative: 71 %
Lymphs Abs: 2 10*3/uL (ref 0.7–4.0)
MCH: 28 pg (ref 26.0–34.0)
MCHC: 32.6 g/dL (ref 30.0–36.0)
MCV: 86.1 fL (ref 80.0–100.0)
Monocytes Absolute: 0.4 10*3/uL (ref 0.1–1.0)
Monocytes Relative: 14 %
Neutro Abs: 0.4 10*3/uL — CL (ref 1.7–7.7)
Neutrophils Relative %: 14 %
Platelet Count: 170 10*3/uL (ref 150–400)
RBC: 3.96 MIL/uL (ref 3.87–5.11)
RDW: 17.5 % — ABNORMAL HIGH (ref 11.5–15.5)
WBC Count: 2.7 10*3/uL — ABNORMAL LOW (ref 4.0–10.5)
nRBC: 0 % (ref 0.0–0.2)

## 2023-12-17 LAB — CMP (CANCER CENTER ONLY)
ALT: 18 U/L (ref 0–44)
AST: 14 U/L — ABNORMAL LOW (ref 15–41)
Albumin: 4 g/dL (ref 3.5–5.0)
Alkaline Phosphatase: 83 U/L (ref 38–126)
Anion gap: 7 (ref 5–15)
BUN: 15 mg/dL (ref 8–23)
CO2: 30 mmol/L (ref 22–32)
Calcium: 9.9 mg/dL (ref 8.9–10.3)
Chloride: 102 mmol/L (ref 98–111)
Creatinine: 0.62 mg/dL (ref 0.44–1.00)
GFR, Estimated: >60 mL/min (ref >60)
Glucose, Bld: 165 mg/dL — ABNORMAL HIGH (ref 70–99)
Potassium: 4 mmol/L (ref 3.5–5.1)
Sodium: 139 mmol/L (ref 135–145)
Total Bilirubin: 0.4 mg/dL (ref 0.0–1.2)
Total Protein: 7.3 g/dL (ref 6.5–8.1)

## 2023-12-17 LAB — FERRITIN: Ferritin: 976 ng/mL — ABNORMAL HIGH (ref 11–307)

## 2023-12-17 NOTE — Progress Notes (Signed)
Hematology and Oncology Follow Up Visit  Kristen Murray [id number] 12/27/1945 78 y.o. 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 She is doing well but feels some fatigue. 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  Her appetite has been increased on the steroid. Hydration good. Weight is 170 lbs.  She has not noted any blood loss. No bruising or petechiae.  No issue with infection. No fever, chills, n/v, cough, rash, dizziness, SOB, chest pain, palpitations, abdominal pain or changes in bowel or bladder habits at this time.  No swelling or tingling in her extremities.  No falls or syncope reported.   ECOG Performance Status: 1 - Symptomatic but completely ambulatory  Medications:  Allergies as of 12/17/2023       Reactions   Codeine Hives, Other (See Comments)   Because of a history of documented adverse serious drug reaction, Medi Alert bracelet  is recommended   Macrodantin [nitrofurantoin Macrocrystal] Other (See Comments)   Numbness, aching all over   Other Shortness Of Breath, Rash, Other (See Comments)   Pine nuts & walnuts throat congestion. No documented angioedema.   Propoxyphene Hives, Swelling, Rash, Other (See Comments)   Propoxyphene Hcl Hives, Swelling, Rash   Augmentin [amoxicillin-pot Clavulanate] Itching   Dulaglutide Other (See Comments)   Other reaction(s): weak, ha, no appetite   Latex Rash, Other (See Comments)   Metformin Hcl Other (See Comments)   Other reaction(s): leg discomfort   Neosporin [bacitracin-polymyxin B] Other (See Comments)   Blisters   Zoster Vac Recomb Adjuvanted Rash, Other (See Comments)   Other reaction(s):  confusion   Avocado Rash   Banana Rash   Ciprofloxacin Nausea Only, Other (See Comments)   Nausea (Note: Patient takes this when on mission trips, however)   Tizanidine Other (See Comments)   Reaction not recalled   Tramadol Nausea Only        Medication List        Accurate as of December 17, 2023  1:23 PM. If you have any questions, ask your nurse or doctor.          acetaminophen 325 MG tablet Commonly known as: TYLENOL Take 325-650 mg by mouth every 6 (six) hours as needed (for pain).   ALPRAZolam 0.25 MG tablet Commonly known as: XANAX Take 0.25 mg by mouth 2 (two) times daily as needed.   azelastine 0.1 % nasal spray Commonly known as: ASTELIN Place into both nostrils 2 (two) times daily. Use in each nostril as directed   b complex vitamins capsule daily.   cetirizine 10 MG tablet Commonly known as: ZYRTEC Take 1 tablet (10 mg total) by mouth daily as needed for allergies (Can take an extra dose during flare ups.).   Cholecalciferol 25 MCG (1000 UT) capsule Take 1,000 Units by mouth daily.   clotrimazole-betamethasone cream Commonly known as: Lotrisone Apply 1 Application topically 2 (two) times daily. Use for 2 weeks as needed.  Apply to area twice weekly at bedtime for maintenance dosing.   cyclobenzaprine 10 MG tablet Commonly known as: FLEXERIL Take 2.5 mg by mouth daily as needed  for muscle spasms.   dextromethorphan-guaiFENesin 30-600 MG 12hr tablet Commonly known as: MUCINEX DM Take 1 tablet by mouth 2 (two) times daily.   diclofenac 0.1 % ophthalmic solution Commonly known as: VOLTAREN 4 (four) times daily as needed.   DropSafe Alcohol Prep 70 % Pads Apply topically.   Eliquis 5 MG Tabs tablet Generic drug: apixaban TAKE 1 TABLET BY MOUTH TWICE  DAILY   EPINEPHrine 0.3 mg/0.3 mL Soaj injection Commonly known as: EPI-PEN Inject 0.3 mg into the muscle as needed for anaphylaxis.   estradiol 0.1 MG/GM vaginal cream Commonly known as:  ESTRACE Place 1 Applicatorful vaginally once a week. 1-2 weekly   fluorometholone 0.1 % ophthalmic suspension Commonly known as: FML Place 1 drop into both eyes every Monday, Wednesday, and Friday.   fluticasone 50 MCG/ACT nasal spray Commonly known as: FLONASE Place 1 spray into both nostrils 2 (two) times daily.   folic acid 400 MCG tablet Commonly known as: FOLVITE daily.   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   glipiZIDE 10 MG 24 hr tablet Commonly known as: GLUCOTROL XL Take 15 mg by mouth daily. Takes 15 mg   glucose blood test strip Inject 1 strip into the skin daily.   glucose blood test strip Commonly known as: FREESTYLE LITE CHECK BLOOD SUGAR ONCE DAILY AS DIRECTED.  DX:790.29   ipratropium 0.06 % nasal spray Commonly known as: ATROVENT Place 2 sprays into both nostrils 3 (three) times daily.   Lantus SoloStar 100 UNIT/ML Solostar Pen Generic drug: insulin glargine Inject into the skin.   levothyroxine 25 MCG tablet Commonly known as: SYNTHROID Take 25 mcg by mouth daily.   lidocaine 5 % ointment Commonly known as: XYLOCAINE Apply 1 application topically 3 (three) times daily as needed.   loratadine 10 MG tablet Commonly known as: CLARITIN Take 1 tablet (10 mg total) by mouth daily.   metoprolol tartrate 50 MG tablet Commonly known as: LOPRESSOR Take 1.5 tablets (75 mg total) by mouth 2 (two) times daily. Pt needs to schedule appt with provider by April, 2025 to avoid any missed doses   montelukast 10 MG tablet Commonly known as: SINGULAIR TAKE 1 TABLET BY MOUTH AT  BEDTIME   multivitamin tablet Take 1 tablet by mouth daily. Shaklee brand   nitroGLYCERIN 0.4 MG SL tablet Commonly known as: NITROSTAT Place 1 tablet (0.4 mg total) under the tongue every 5 (five) minutes as needed for chest pain.    pantoprazole 40 MG tablet Commonly known as: PROTONIX Take 1 tablet (40 mg total) by mouth 2 (two) times daily.   predniSONE 5 MG (21) Tbpk tablet Commonly known as: STERAPRED UNI-PAK 21 TAB Take 5 mg by mouth daily.   rosuvastatin 5 MG tablet Commonly known as: CRESTOR Take 5 mg by mouth See admin instructions. Take 5 mg by mouth at bedtime on Mondays and Thursdays..Currently on hold   traMADol 50 MG tablet Commonly known as: ULTRAM Take 1 tablet 4 times a day by oral route as needed.   tretinoin 0.025 % cream Commonly known as: RETIN-A Apply 1 application. topically at bedtime.   trolamine salicylate 10 % cream Commonly known as: ASPERCREME Apply 1 application topically as needed for muscle pain.   valACYclovir 500 MG tablet Commonly known as: VALTREX Take 1 tablet (500 mg total) by mouth 2 (two) times daily. Take twice a  day for thee days as needed for an outbreak.   verapamil 180 MG CR tablet Commonly known as: CALAN-SR Take 180 mg by mouth at bedtime.        Allergies:  Allergies  Allergen Reactions   Codeine Hives and Other (See Comments)    Because of a history of documented adverse serious drug reaction, Medi Alert bracelet  is recommended   Macrodantin [Nitrofurantoin Macrocrystal] Other (See Comments)    Numbness, aching all over   Other Shortness Of Breath, Rash and Other (See Comments)    Pine nuts & walnuts throat congestion. No documented angioedema.    Propoxyphene Hives, Swelling, Rash and Other (See Comments)   Propoxyphene Hcl Hives, Swelling and Rash   Augmentin [Amoxicillin-Pot Clavulanate] Itching   Dulaglutide Other (See Comments)    Other reaction(s): weak, ha, no appetite   Latex Rash and Other (See Comments)   Metformin Hcl Other (See Comments)    Other reaction(s): leg discomfort   Neosporin [Bacitracin-Polymyxin B] Other (See Comments)    Blisters   Zoster Vac Recomb Adjuvanted Rash and Other (See Comments)    Other reaction(s):  confusion   Avocado Rash   Banana Rash   Ciprofloxacin Nausea Only and Other (See Comments)    Nausea (Note: Patient takes this when on mission trips, however)   Tizanidine Other (See Comments)    Reaction not recalled   Tramadol Nausea Only    Past Medical History, Surgical history, Social history, and Family History were reviewed and updated.  Review of Systems: All other 10 point review of systems is negative.   Physical Exam:  weight is 170 lb (77.1 kg). Her blood pressure is 160/66 (abnormal) and her pulse is 54 (abnormal). Her respiration is 17 and oxygen saturation is 100%.   Wt Readings from Last 3 Encounters:  12/17/23 170 lb (77.1 kg)  11/19/23 165 lb (74.8 kg)  11/13/23 165 lb 1.6 oz (74.9 kg)    Ocular: Sclerae unicteric, pupils equal, round and reactive to light Ear-nose-throat: Oropharynx clear, dentition fair Lymphatic: No cervical or supraclavicular adenopathy Lungs no rales or rhonchi, good excursion bilaterally Heart regular rate and rhythm, no murmur appreciated Abd soft, nontender, positive bowel sounds MSK no focal spinal tenderness, no joint edema Neuro: non-focal, well-oriented, appropriate affect Breasts: Deferred   Lab Results  Component Value Date   WBC 2.7 (L) 12/17/2023   HGB 11.1 (L) 12/17/2023   HCT 34.1 (L) 12/17/2023   MCV 86.1 12/17/2023   PLT 170 12/17/2023   Lab Results  Component Value Date   FERRITIN 1,106 (H) 11/19/2023   IRON 33 11/19/2023   TIBC 232 (L) 11/19/2023   UIBC 199 11/19/2023   IRONPCTSAT 14 11/19/2023   Lab Results  Component Value Date   RETICCTPCT 1.6 12/17/2023   RBC 3.91 12/17/2023   No results found for: "KPAFRELGTCHN", "LAMBDASER", "KAPLAMBRATIO" No results found for: "IGGSERUM", "IGA", "IGMSERUM" No results found for: "TOTALPROTELP", "ALBUMINELP", "A1GS", "A2GS", "BETS", "BETA2SER", "GAMS", "MSPIKE", "SPEI"   Chemistry      Component Value Date/Time   NA 136 11/19/2023 1257   NA 138 03/10/2020 1138    K 4.4 11/19/2023 1257   CL 100 11/19/2023 1257   CO2 28 11/19/2023 1257   BUN 15 11/19/2023 1257   BUN 10 03/10/2020 1138   CREATININE 0.69 11/19/2023 1257      Component Value Date/Time   CALCIUM 9.8 11/19/2023 1257   ALKPHOS 88 11/19/2023 1257   AST 20 11/19/2023 1257  ALT 16 11/19/2023 1257   BILITOT 0.4 11/19/2023 1257       Impression and Plan: Ms. Robey is a very pleasant 78 yo African American female with low-grade MDS, low IPSS-R score.  WBC count is 2.7, ANC 0.4. She remains asymptomatic at this time.  Iron studies are pending. We will replace if needed.  No Nplate or ESA needed this visit.  We will see her again in 4 weeks.   Eileen Stanford, NP 2/17/20251:23 PM

## 2024-01-03 ENCOUNTER — Emergency Department (HOSPITAL_BASED_OUTPATIENT_CLINIC_OR_DEPARTMENT_OTHER)
Admission: EM | Admit: 2024-01-03 | Discharge: 2024-01-03 | Disposition: A | Discharge status: Home / Self Care | Attending: Emergency Medicine | Admitting: Emergency Medicine

## 2024-01-03 ENCOUNTER — Other Ambulatory Visit: Payer: Self-pay

## 2024-01-03 ENCOUNTER — Encounter (HOSPITAL_BASED_OUTPATIENT_CLINIC_OR_DEPARTMENT_OTHER): Payer: Self-pay | Admitting: Emergency Medicine

## 2024-01-03 DIAGNOSIS — Z794 Long term (current) use of insulin: Secondary | ICD-10-CM | POA: Insufficient documentation

## 2024-01-03 DIAGNOSIS — Z91040 Latex allergy status: Secondary | ICD-10-CM | POA: Diagnosis not present

## 2024-01-03 DIAGNOSIS — K068 Other specified disorders of gingiva and edentulous alveolar ridge: Secondary | ICD-10-CM | POA: Insufficient documentation

## 2024-01-03 DIAGNOSIS — Z7901 Long term (current) use of anticoagulants: Secondary | ICD-10-CM | POA: Insufficient documentation

## 2024-01-03 NOTE — ED Triage Notes (Signed)
 Pt reports bleeding in her mouth, first noticed at around 0150 this morning. No pain or known injury. Teeth seem to be intact. Pt takes eliquis.

## 2024-01-03 NOTE — ED Provider Notes (Signed)
 [facility] Provider Note   CSN: [id number] Arrival date & time: 01/03/24  0245     History  Chief Complaint  Patient presents with   bleeding gums    Kristen Murray is a 78 y.o. female.  The history is provided by the patient.  Illness Location:  Right upper gums near first molar Quality:  Bleeding Severity:  Mild Onset quality:  Sudden Duration:  1 hour Timing:  Constant Progression:  Partially resolved Chronicity:  New Context:  Brushing Relieved by:  Nothing Ineffective treatments:  None Associated symptoms: no fever, no shortness of breath, no sore throat, no vomiting and no wheezing   Patient is on Eliquis and has had bleeding from Right upper gums, with brushing.       Home Medications Prior to Admission medications   Medication Sig Start Date End Date Taking? Authorizing Provider  acetaminophen (TYLENOL) 325 MG tablet Take 325-650 mg by mouth every 6 (six) hours as needed (for pain).    [provider]  Alcohol Swabs (DROPSAFE ALCOHOL PREP) 70 % PADS Apply topically. 10/19/22   [provider]  ALPRAZolam Prudy Feeler) 0.25 MG tablet Take 0.25 mg by mouth 2 (two) times daily as needed. 04/19/21   [provider]  azelastine (ASTELIN) 0.1 % nasal spray Place into both nostrils 2 (two) times daily. Use in each nostril as directed    [provider]  b complex vitamins capsule daily.    [provider]  cetirizine (ZYRTEC) 10 MG tablet Take 1 tablet (10 mg total) by mouth daily as needed for allergies (Can take an extra dose during flare ups.). 11/13/23   Nehemiah Settle, FNP  Cholecalciferol 25 MCG (1000 UT) capsule Take 1,000 Units by mouth daily.    [provider]  clotrimazole-betamethasone (LOTRISONE) cream Apply 1 Application topically 2 (two) times daily. Use for 2 weeks as needed.  Apply to area twice weekly at bedtime for maintenance dosing. 02/27/23   Patton Salles, MD  cyclobenzaprine (FLEXERIL) 10 MG tablet Take 2.5 mg by mouth daily as needed for muscle spasms.    [provider]  dextromethorphan-guaiFENesin (MUCINEX DM) 30-600 MG 12hr tablet Take 1 tablet by mouth 2 (two) times daily. 05/30/22   Kozlow, Alvira Philips, MD  diclofenac (VOLTAREN) 0.1 % ophthalmic solution 4 (four) times daily as needed.    [provider]  ELIQUIS 5 MG TABS tablet TAKE 1 TABLET BY MOUTH TWICE  DAILY 05/31/23   Nahser, Deloris Ping, MD  EPINEPHrine 0.3 mg/0.3 mL IJ SOAJ injection Inject 0.3 mg into the muscle as needed for anaphylaxis. 11/13/23   Nehemiah Settle, FNP  estradiol (ESTRACE) 0.1 MG/GM vaginal cream Place 1 Applicatorful vaginally once a week. 1-2 weekly 11/27/23   [provider]  fluorometholone (FML) 0.1 % ophthalmic suspension Place 1 drop into both eyes every Monday, Wednesday, and Friday.     [provider]  fluticasone (FLONASE) 50 MCG/ACT nasal spray Place 1 spray into both nostrils 2 (two) times daily. 05/30/22   Kozlow, Alvira Philips, MD  folic acid (FOLVITE) 400 MCG tablet daily.    [provider]  furosemide (LASIX) 20 MG tablet Take 20 mg by mouth daily. 05/10/23   [provider]  glipiZIDE (GLUCOTROL XL) 10 MG 24 hr tablet Take 15 mg by mouth daily. Takes 15 mg 05/05/22   [provider]  glucose blood (FREESTYLE LITE) test strip CHECK BLOOD SUGAR ONCE  DAILY AS DIRECTED.  DX:790.29 01/09/14   Pecola Lawless, MD  glucose blood test strip Inject 1 strip into the skin daily.    [provider]  ipratropium (ATROVENT) 0.06 % nasal spray Place 2 sprays into both nostrils 3 (three) times daily. 05/30/22   Kozlow, Alvira Philips, MD  Lancets (FREESTYLE) lancets Check blood sugar once daily as directed DX:790.29 (FREESTYLE FREEDOM LITE) 01/09/14   Pecola Lawless, MD  LANTUS SOLOSTAR 100 UNIT/ML Solostar Pen Inject into the skin. 10/19/23   [provider]  levothyroxine (SYNTHROID) 25 MCG tablet Take  25 mcg by mouth daily. 10/19/23   [provider]  lidocaine (XYLOCAINE) 5 % ointment Apply 1 application topically 3 (three) times daily as needed. 06/06/21   Patton Salles, MD  loratadine (CLARITIN) 10 MG tablet Take 1 tablet (10 mg total) by mouth daily. 01/10/20   Jacalyn Lefevre, MD  metoprolol tartrate (LOPRESSOR) 50 MG tablet Take 1.5 tablets (75 mg total) by mouth 2 (two) times daily. Pt needs to schedule appt with provider by Craig Ionescu, 2025 to avoid any missed doses 11/27/23   Nahser, Deloris Ping, MD  montelukast (SINGULAIR) 10 MG tablet TAKE 1 TABLET BY MOUTH AT  BEDTIME 11/17/22   Kozlow, Alvira Philips, MD  Multiple Vitamin (MULTIVITAMIN) tablet Take 1 tablet by mouth daily. Shaklee brand    [provider]  nitroGLYCERIN (NITROSTAT) 0.4 MG SL tablet Place 1 tablet (0.4 mg total) under the tongue every 5 (five) minutes as needed for chest pain. 01/11/21   Tereso Newcomer T, PA-C  pantoprazole (PROTONIX) 40 MG tablet Take 1 tablet (40 mg total) by mouth 2 (two) times daily. 05/30/22   Kozlow, Alvira Philips, MD  predniSONE (STERAPRED UNI-PAK 21 TAB) 5 MG (21) TBPK tablet Take 5 mg by mouth daily.    [provider]  rosuvastatin (CRESTOR) 5 MG tablet Take 5 mg by mouth See admin instructions. Take 5 mg by mouth at bedtime on Mondays and Thursdays..Currently on hold 11/20/19   [provider]  traMADol (ULTRAM) 50 MG tablet Take 1 tablet 4 times a day by oral route as needed. 04/02/23   [provider]  tretinoin (RETIN-A) 0.025 % cream Apply 1 application. topically at bedtime. 12/14/21   [provider]  trolamine salicylate (ASPERCREME) 10 % cream Apply 1 application topically as needed for muscle pain.    [provider]  valACYclovir (VALTREX) 500 MG tablet Take 1 tablet (500 mg total) by mouth 2 (two) times daily. Take twice a day for thee days as needed for an outbreak. 02/27/23   Patton Salles, MD  verapamil (CALAN-SR) 180 MG CR tablet  Take 180 mg by mouth at bedtime. 01/02/17   [provider]      Allergies    Codeine, Macrodantin [nitrofurantoin macrocrystal], Other, Propoxyphene, Propoxyphene hcl, Augmentin [amoxicillin-pot clavulanate], Dulaglutide, Latex, Metformin hcl, Neosporin [bacitracin-polymyxin b], Zoster vac recomb adjuvanted, Avocado, Banana, Ciprofloxacin, Tizanidine, and Tramadol    Review of Systems   Review of Systems  Constitutional:  Negative for fever.  HENT:  Positive for dental problem. Negative for facial swelling and sore throat.   Respiratory:  Negative for shortness of breath and wheezing.   Gastrointestinal:  Negative for vomiting.  All other systems reviewed and are negative.   Physical Exam Updated Vital Signs BP (!) 168/77 (BP Location: Left Arm)   Pulse 75   Temp 97.9 F (36.6 C) (Oral)   Resp 16  Ht 5' 3.5" (1.613 m)   Wt 77.1 kg   LMP 07/30/1990   SpO2 94%   BMI 29.64 kg/m  Physical Exam Vitals and nursing note reviewed.  Constitutional:      General: She is not in acute distress.    Appearance: Normal appearance. She is well-developed.  HENT:     Head: Normocephalic and atraumatic.     Nose: Nose normal.     Mouth/Throat:     Comments: Patient with gauze with scant blood, dental carie, no active bleeding on exam Eyes:     Pupils: Pupils are equal, round, and reactive to light.  Cardiovascular:     Rate and Rhythm: Normal rate and regular rhythm.     Pulses: Normal pulses.     Heart sounds: Normal heart sounds.  Pulmonary:     Effort: Pulmonary effort is normal. No respiratory distress.     Breath sounds: Normal breath sounds.  Abdominal:     General: Bowel sounds are normal. There is no distension.     Palpations: Abdomen is soft.     Tenderness: There is no abdominal tenderness. There is no guarding or rebound.  Musculoskeletal:        General: Normal range of motion.     Cervical back: Neck supple.  Skin:    General: Skin is warm and dry.      Capillary Refill: Capillary refill takes less than 2 seconds.     Findings: No erythema or rash.  Neurological:     General: No focal deficit present.     Mental Status: She is alert and oriented to person, place, and time.     Deep Tendon Reflexes: Reflexes normal.  Psychiatric:        Mood and Affect: Mood normal.     ED Results / Procedures / Treatments   Labs (all labs ordered are listed, but only abnormal results are displayed) Labs Reviewed - No data to display  EKG None  Radiology No results found.  Procedures Procedures    Medications Ordered in ED Medications - No data to display  ED Course/ Medical Decision Making/ A&P                                 Medical Decision Making Patient with gingival bleeding right upper on eliquis  Amount and/or Complexity of Data Reviewed External Data Reviewed: notes.    Details: Previous notes reviewed   Risk Risk Details: Warm tea bag applied for tannic acid.  Patient will need to be gentle with brushing and follow up with dentist for ongoing care.  Scant bleeding x 1 hours does not require labs.  Stable for discharge.      Final Clinical Impression(s) / ED Diagnoses Final diagnoses:  Bleeding gums   No signs of systemic illness or infection. The patient is nontoxic-appearing on exam and vital signs are within normal limits.  I have reviewed the triage vital signs and the nursing notes. Pertinent labs & imaging results that were available during my care of the patient were reviewed by me and considered in my medical decision making (see chart for details). After history, exam, and medical workup I feel the patient has been appropriately medically screened and is safe for discharge home. Pertinent diagnoses were discussed with the patient. Patient was given return precautions.    Rx / DC Orders ED Discharge Orders     None  Kennedi Lizardo, MD 01/03/24 [phone]

## 2024-01-18 ENCOUNTER — Inpatient Hospital Stay (HOSPITAL_BASED_OUTPATIENT_CLINIC_OR_DEPARTMENT_OTHER): Payer: Medicare Other | Admitting: Family

## 2024-01-18 ENCOUNTER — Inpatient Hospital Stay: Payer: Medicare Other

## 2024-01-18 ENCOUNTER — Encounter: Payer: Self-pay | Admitting: Family

## 2024-01-18 ENCOUNTER — Inpatient Hospital Stay: Payer: Medicare Other | Attending: Hematology & Oncology

## 2024-01-18 VITALS — BP 135/45 | HR 76 | Temp 98.0°F | Resp 17 | Wt 170.0 lb

## 2024-01-18 DIAGNOSIS — D46Z Other myelodysplastic syndromes: Secondary | ICD-10-CM

## 2024-01-18 DIAGNOSIS — D500 Iron deficiency anemia secondary to blood loss (chronic): Secondary | ICD-10-CM

## 2024-01-18 DIAGNOSIS — D72819 Decreased white blood cell count, unspecified: Secondary | ICD-10-CM

## 2024-01-18 DIAGNOSIS — D4620 Refractory anemia with excess of blasts, unspecified: Secondary | ICD-10-CM | POA: Diagnosis present

## 2024-01-18 DIAGNOSIS — D508 Other iron deficiency anemias: Secondary | ICD-10-CM | POA: Insufficient documentation

## 2024-01-18 DIAGNOSIS — K909 Intestinal malabsorption, unspecified: Secondary | ICD-10-CM | POA: Insufficient documentation

## 2024-01-18 DIAGNOSIS — Z79899 Other long term (current) drug therapy: Secondary | ICD-10-CM | POA: Diagnosis not present

## 2024-01-18 LAB — CBC WITH DIFFERENTIAL (CANCER CENTER ONLY)
Abs Immature Granulocytes: 0.12 10*3/uL — ABNORMAL HIGH (ref 0.00–0.07)
Basophils Absolute: 0 10*3/uL (ref 0.0–0.1)
Basophils Relative: 0 %
Eosinophils Absolute: 0 10*3/uL (ref 0.0–0.5)
Eosinophils Relative: 1 %
HCT: 35.8 % — ABNORMAL LOW (ref 36.0–46.0)
Hemoglobin: 11.6 g/dL — ABNORMAL LOW (ref 12.0–15.0)
Immature Granulocytes: 7 %
Lymphocytes Relative: 58 %
Lymphs Abs: 1.1 10*3/uL (ref 0.7–4.0)
MCH: 29.1 pg (ref 26.0–34.0)
MCHC: 32.4 g/dL (ref 30.0–36.0)
MCV: 89.7 fL (ref 80.0–100.0)
Monocytes Absolute: 0.3 10*3/uL (ref 0.1–1.0)
Monocytes Relative: 19 %
Neutro Abs: 0.3 10*3/uL — CL (ref 1.7–7.7)
Neutrophils Relative %: 15 %
Platelet Count: 136 10*3/uL — ABNORMAL LOW (ref 150–400)
RBC: 3.99 MIL/uL (ref 3.87–5.11)
RDW: 15.7 % — ABNORMAL HIGH (ref 11.5–15.5)
WBC Count: 1.8 10*3/uL — ABNORMAL LOW (ref 4.0–10.5)
nRBC: 0 % (ref 0.0–0.2)

## 2024-01-18 LAB — CMP (CANCER CENTER ONLY)
ALT: 9 U/L (ref 0–44)
AST: 14 U/L — ABNORMAL LOW (ref 15–41)
Albumin: 4 g/dL (ref 3.5–5.0)
Alkaline Phosphatase: 79 U/L (ref 38–126)
Anion gap: 8 (ref 5–15)
BUN: 11 mg/dL (ref 8–23)
CO2: 29 mmol/L (ref 22–32)
Calcium: 9.7 mg/dL (ref 8.9–10.3)
Chloride: 101 mmol/L (ref 98–111)
Creatinine: 0.72 mg/dL (ref 0.44–1.00)
GFR, Estimated: >60 mL/min (ref >60)
Glucose, Bld: 295 mg/dL — ABNORMAL HIGH (ref 70–99)
Potassium: 4.5 mmol/L (ref 3.5–5.1)
Sodium: 138 mmol/L (ref 135–145)
Total Bilirubin: 0.4 mg/dL (ref 0.0–1.2)
Total Protein: 7.3 g/dL (ref 6.5–8.1)

## 2024-01-18 LAB — IRON AND IRON BINDING CAPACITY (CC-WL,HP ONLY)
Iron: 143 ug/dL (ref 28–170)
Saturation Ratios: 60 % — ABNORMAL HIGH (ref 10.4–31.8)
TIBC: 237 ug/dL — ABNORMAL LOW (ref 250–450)
UIBC: 94 ug/dL — ABNORMAL LOW (ref 148–442)

## 2024-01-18 LAB — FERRITIN: Ferritin: 901 ng/mL — ABNORMAL HIGH (ref 11–307)

## 2024-01-18 LAB — RETICULOCYTES
Immature Retic Fract: 7.2 % (ref 2.3–15.9)
RBC.: 4.03 MIL/uL (ref 3.87–5.11)
Retic Count, Absolute: 43.1 10*3/uL (ref 19.0–186.0)
Retic Ct Pct: 1.1 % (ref 0.4–3.1)

## 2024-01-18 MED ORDER — FILGRASTIM-SNDZ 480 MCG/0.8ML IJ SOSY
480.0000 ug | PREFILLED_SYRINGE | Freq: Once | INTRAMUSCULAR | Status: AC
Start: 2024-01-18 — End: ?
  Filled 2024-01-18: qty 0.8

## 2024-01-18 NOTE — Progress Notes (Signed)
 Hematology and Oncology Follow Up Visit  CHRISTINEA BRIZUELA [id number] January 21, 1946 78 y.o. 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 Ehlert is here today for follow-up. She is doing well and just returned from a wonderful trip to see her daughter and granddaughter. She helped her daughter after surgery.  She has not had any issues with infection.  Some fatigue at ties but she is resting much better at home in her own bed.  No fever, chills, n/v, cough, rash, dizziness, SOB, chest pain, palpitations, abdominal pain or changes in bowel or bladder habits.  No swelling, tenderness, numbness or tingling in her extremities.  No falls or syncope.  Appetite and hydration are good. Weight is stable at 170 lbs.   ECOG Performance Status: 0 - Asymptomatic  Medications:  Allergies as of 01/18/2024       Reactions   Codeine Hives, Other (See Comments)   Because of a history of documented adverse serious drug reaction, Medi Alert bracelet  is recommended   Macrodantin [nitrofurantoin Macrocrystal] Other (See Comments)   Numbness, aching all over   Other Shortness Of Breath, Rash, Other (See Comments)   Pine nuts & walnuts throat congestion. No documented angioedema.   Propoxyphene Hives, Swelling, Rash, Other (See Comments)   Propoxyphene Hcl Hives, Swelling, Rash   Augmentin [amoxicillin-pot Clavulanate] Itching   Dulaglutide Other (See Comments)   Other reaction(s): weak, ha, no appetite   Latex Rash, Other (See Comments)   Metformin Hcl Other (See Comments)   Other reaction(s): leg discomfort   Neosporin [bacitracin-polymyxin B] Other (See Comments)   Blisters   Zoster Vac Recomb Adjuvanted Rash, Other (See Comments)   Other  reaction(s): confusion   Avocado Rash   Banana Rash   Ciprofloxacin Nausea Only, Other (See Comments)   Nausea (Note: Patient takes this when on mission trips, however)   Tizanidine Other (See Comments)   Reaction not recalled   Tramadol Nausea Only        Medication List        Accurate as of January 18, 2024  1:26 PM. If you have any questions, ask your nurse or doctor.          acetaminophen 325 MG tablet Commonly known as: TYLENOL Take 325-650 mg by mouth every 6 (six) hours as needed (for pain).   ALPRAZolam 0.25 MG tablet Commonly known as: XANAX Take 0.25 mg by mouth 2 (two) times daily as needed.   azelastine 0.1 % nasal spray Commonly known as: ASTELIN Place into both nostrils 2 (two) times daily. Use in each nostril as directed   b complex vitamins capsule daily.   cetirizine 10 MG tablet Commonly known as: ZYRTEC Take 1 tablet (10 mg total) by mouth daily as needed for allergies (Can take an extra dose during flare ups.).   Cholecalciferol 25 MCG (1000 UT) capsule Take 1,000 Units by mouth daily.   clotrimazole-betamethasone cream Commonly known as: Lotrisone Apply 1 Application topically 2 (two) times daily. Use for 2 weeks as needed.  Apply to area twice weekly at bedtime for maintenance dosing.   cyclobenzaprine 10 MG tablet Commonly known as: FLEXERIL Take 2.5 mg by mouth  daily as needed for muscle spasms.   dextromethorphan-guaiFENesin 30-600 MG 12hr tablet Commonly known as: MUCINEX DM Take 1 tablet by mouth 2 (two) times daily.   diclofenac 0.1 % ophthalmic solution Commonly known as: VOLTAREN 4 (four) times daily as needed.   DropSafe Alcohol Prep 70 % Pads Apply topically.   Eliquis 5 MG Tabs tablet Generic drug: apixaban TAKE 1 TABLET BY MOUTH TWICE  DAILY   EPINEPHrine 0.3 mg/0.3 mL Soaj injection Commonly known as: EPI-PEN Inject 0.3 mg into the muscle as needed for anaphylaxis.   estradiol 0.1 MG/GM vaginal cream Commonly  known as: ESTRACE Place 1 Applicatorful vaginally once a week. 1-2 weekly   fluorometholone 0.1 % ophthalmic suspension Commonly known as: FML Place 1 drop into both eyes every Monday, Wednesday, and Friday.   fluticasone 50 MCG/ACT nasal spray Commonly known as: FLONASE Place 1 spray into both nostrils 2 (two) times daily.   folic acid 400 MCG tablet Commonly known as: FOLVITE daily.   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   glipiZIDE 10 MG 24 hr tablet Commonly known as: GLUCOTROL XL Take 15 mg by mouth daily. Takes 15 mg   glucose blood test strip Inject 1 strip into the skin daily.   glucose blood test strip Commonly known as: FREESTYLE LITE CHECK BLOOD SUGAR ONCE DAILY AS DIRECTED.  DX:790.29   ipratropium 0.06 % nasal spray Commonly known as: ATROVENT Place 2 sprays into both nostrils 3 (three) times daily.   Lantus SoloStar 100 UNIT/ML Solostar Pen Generic drug: insulin glargine Inject into the skin.   levothyroxine 25 MCG tablet Commonly known as: SYNTHROID Take 25 mcg by mouth daily.   lidocaine 5 % ointment Commonly known as: XYLOCAINE Apply 1 application topically 3 (three) times daily as needed.   loratadine 10 MG tablet Commonly known as: CLARITIN Take 1 tablet (10 mg total) by mouth daily.   metoprolol tartrate 50 MG tablet Commonly known as: LOPRESSOR Take 1.5 tablets (75 mg total) by mouth 2 (two) times daily. Pt needs to schedule appt with provider by April, 2025 to avoid any missed doses   montelukast 10 MG tablet Commonly known as: SINGULAIR TAKE 1 TABLET BY MOUTH AT  BEDTIME   multivitamin tablet Take 1 tablet by mouth daily. Shaklee brand   nitroGLYCERIN 0.4 MG SL tablet Commonly known as: NITROSTAT Place 1 tablet (0.4 mg total) under the tongue every 5 (five) minutes as needed for chest pain.    pantoprazole 40 MG tablet Commonly known as: PROTONIX Take 1 tablet (40 mg total) by mouth 2 (two) times daily.   predniSONE 5 MG (21) Tbpk tablet Commonly known as: STERAPRED UNI-PAK 21 TAB Take 5 mg by mouth daily.   rosuvastatin 5 MG tablet Commonly known as: CRESTOR Take 5 mg by mouth See admin instructions. Take 5 mg by mouth at bedtime on Mondays and Thursdays..Currently on hold   traMADol 50 MG tablet Commonly known as: ULTRAM Take 1 tablet 4 times a day by oral route as needed.   tretinoin 0.025 % cream Commonly known as: RETIN-A Apply 1 application. topically at bedtime.   trolamine salicylate 10 % cream Commonly known as: ASPERCREME Apply 1 application topically as needed for muscle pain.   valACYclovir 500 MG tablet Commonly known as: VALTREX Take 1 tablet (500 mg total) by mouth 2 (two) times daily.  Take twice a day for thee days as needed for an outbreak.   verapamil 180 MG CR tablet Commonly known as: CALAN-SR Take 180 mg by mouth at bedtime.        Allergies:  Allergies  Allergen Reactions   Codeine Hives and Other (See Comments)    Because of a history of documented adverse serious drug reaction, Medi Alert bracelet  is recommended   Macrodantin [Nitrofurantoin Macrocrystal] Other (See Comments)    Numbness, aching all over   Other Shortness Of Breath, Rash and Other (See Comments)    Pine nuts & walnuts throat congestion. No documented angioedema.    Propoxyphene Hives, Swelling, Rash and Other (See Comments)   Propoxyphene Hcl Hives, Swelling and Rash   Augmentin [Amoxicillin-Pot Clavulanate] Itching   Dulaglutide Other (See Comments)    Other reaction(s): weak, ha, no appetite   Latex Rash and Other (See Comments)   Metformin Hcl Other (See Comments)    Other reaction(s): leg discomfort   Neosporin [Bacitracin-Polymyxin B] Other (See Comments)    Blisters   Zoster Vac Recomb Adjuvanted Rash and Other (See Comments)    Other reaction(s):  confusion   Avocado Rash   Banana Rash   Ciprofloxacin Nausea Only and Other (See Comments)    Nausea (Note: Patient takes this when on mission trips, however)   Tizanidine Other (See Comments)    Reaction not recalled   Tramadol Nausea Only    Past Medical History, Surgical history, Social history, and Family History were reviewed and updated.  Review of Systems: All other 10 point review of systems is negative.   Physical Exam:  vitals were not taken for this visit.   Wt Readings from Last 3 Encounters:  01/03/24 170 lb (77.1 kg)  12/17/23 170 lb (77.1 kg)  11/19/23 165 lb (74.8 kg)    Ocular: Sclerae unicteric, pupils equal, round and reactive to light Ear-nose-throat: Oropharynx clear, dentition fair Lymphatic: No cervical or supraclavicular adenopathy Lungs no rales or rhonchi, good excursion bilaterally Heart regular rate and rhythm, no murmur appreciated Abd soft, nontender, positive bowel sounds MSK no focal spinal tenderness, no joint edema Neuro: non-focal, well-oriented, appropriate affect Breasts: Deferred   Lab Results  Component Value Date   WBC 1.8 (L) 01/18/2024   HGB 11.6 (L) 01/18/2024   HCT 35.8 (L) 01/18/2024   MCV 89.7 01/18/2024   PLT 136 (L) 01/18/2024   Lab Results  Component Value Date   FERRITIN 976 (H) 12/17/2023   IRON 85 12/17/2023   TIBC 295 12/17/2023   UIBC 210 12/17/2023   IRONPCTSAT 29 12/17/2023   Lab Results  Component Value Date   RETICCTPCT 1.1 01/18/2024   RBC 4.03 01/18/2024   RBC 3.99 01/18/2024   No results found for: "KPAFRELGTCHN", "LAMBDASER", "KAPLAMBRATIO" No results found for: "IGGSERUM", "IGA", "IGMSERUM" No results found for: "TOTALPROTELP", "ALBUMINELP", "A1GS", "A2GS", "BETS", "BETA2SER", "GAMS", "MSPIKE", "SPEI"   Chemistry      Component Value Date/Time   NA 139 12/17/2023 1244   NA 138 03/10/2020 1138   K 4.0 12/17/2023 1244   CL 102 12/17/2023 1244   CO2 30 12/17/2023 1244   BUN 15 12/17/2023  1244   BUN 10 03/10/2020 1138   CREATININE 0.62 12/17/2023 1244      Component Value Date/Time   CALCIUM 9.9 12/17/2023 1244   ALKPHOS 83 12/17/2023 1244   AST 14 (L) 12/17/2023 1244   ALT 18 12/17/2023 1244   BILITOT 0.4 12/17/2023 1244  Impression and Plan:  Ms. Trevizo is a very pleasant 78 yo African American female with low-grade MDS, low IPSS-R score.  WBC count is 1.8, ANC 0.3. I discussed with Dr. Myna Hidalgo and we will hold off on giving her the Zarxio for now since she is asymptomatic.  Iron studies are pending. We will replace if needed.  No Nplate or ESA needed this visit.  We will see her again in 4 weeks.   Eileen Stanford, NP 3/21/20251:26 PM

## 2024-01-30 ENCOUNTER — Other Ambulatory Visit: Payer: Self-pay | Admitting: Physician Assistant

## 2024-01-30 DIAGNOSIS — R590 Localized enlarged lymph nodes: Secondary | ICD-10-CM

## 2024-02-12 ENCOUNTER — Ambulatory Visit: Payer: Medicare Other | Admitting: Allergy and Immunology

## 2024-02-12 VITALS — BP 136/60 | HR 69 | Temp 98.0°F | Resp 14 | Ht 63.25 in | Wt 170.0 lb

## 2024-02-12 DIAGNOSIS — J301 Allergic rhinitis due to pollen: Secondary | ICD-10-CM

## 2024-02-12 DIAGNOSIS — J3089 Other allergic rhinitis: Secondary | ICD-10-CM

## 2024-02-12 DIAGNOSIS — T7805XD Anaphylactic reaction due to tree nuts and seeds, subsequent encounter: Secondary | ICD-10-CM

## 2024-02-12 DIAGNOSIS — K219 Gastro-esophageal reflux disease without esophagitis: Secondary | ICD-10-CM

## 2024-02-12 MED ORDER — EPINEPHRINE 0.3 MG/0.3ML IJ SOAJ: 0.3000 mg | INTRAMUSCULAR | 1 refills | Status: DC | PRN

## 2024-02-12 MED ORDER — PREDNISONE 10 MG PO TABS: 5.0000 mg | ORAL_TABLET | Freq: Every day | ORAL | 0 refills | Status: AC

## 2024-02-12 MED ORDER — FLUTICASONE PROPIONATE 50 MCG/ACT NA SUSP: 1.0000 | Freq: Two times a day (BID) | NASAL | 1 refills | Status: DC

## 2024-02-12 MED ORDER — MONTELUKAST SODIUM 10 MG PO TABS: 10.0000 mg | ORAL_TABLET | Freq: Every evening | ORAL | 1 refills | Status: AC

## 2024-02-12 MED ORDER — CETIRIZINE HCL 10 MG PO TABS: 10.0000 mg | ORAL_TABLET | Freq: Every day | ORAL | 3 refills | Status: DC | PRN

## 2024-02-12 MED ORDER — PANTOPRAZOLE SODIUM 40 MG PO TBEC: 40.0000 mg | DELAYED_RELEASE_TABLET | Freq: Two times a day (BID) | ORAL | 1 refills | Status: DC

## 2024-02-12 NOTE — Patient Instructions (Addendum)
  1. Continue avoidance - tree nuts, pine nuts, banana, avocado,  tree, grass, weed  2. Continue to Treat and prevent inflammation:   A. Flonase 1-2 sprays each nostril one time per day  B. montelukast 10 mg tablet once a day  3. Continue to Treat LPR:   A. Continue to consolidate all caffeine and chocolate use  B. Continue to replace throat clearing with swallowing maneuver  C. Pantoprazole 40 mg - 1 tablet 1-2 times per day  4. If needed:   A. nasal saline  B. OTC antihistamine - Cetirizine (Zyrtec)  C. Epi-Pen  D. Can add OTC Mucinex two times per day  E. Ipratropium 0.06% 2 sprays each nostril every 6 hours t  5. For this recent event:   A. Prednisone 10 mg - 1/2 tablet daily x 7 days (blood sugars)  6. Influenza = Tamiflu. Covid = Paxlovid  7. Return to clinic in 6 months or earlier if problem

## 2024-02-12 NOTE — Progress Notes (Unsigned)
 [place] - [place] - [place] - [place]   Follow-up Note  Referring Provider: Delma Officer, [place] Primary Provider: Delma Officer, PA Date of Office Visit: 02/12/2024  Subjective:   Kristen Murray (DOB: 02/25/1946) is a 78 y.o. female who [facility] in re-evaluation of the following:  HPI: [name] returns to the clinic in evaluation of allergic rhinitis, LPR, food allergy directed against tree nuts, banana, avocado.  I last saw her in this clinic 30 May 2022.  She has visited with our nurse practitioner on 13 November 2023.  Her big complaint is that over the course of the past 5 weeks she has been having lots of sneezing and a little bit of a dry cough without any ugly nasal discharge or anosmia or headaches or significant lower respiratory tract symptoms.  She does use her fluticasone every day and she uses montelukast every day.  She also has a chronic runny nose but she only uses her nasal ipratropium about 1 time per day.  She thinks that her reflux and her throat issue was under very good control while using Protonix and being very careful about her dietary consumption.  Allergies as of 02/12/2024       Reactions   Codeine Hives, Other (See Comments)   Because of a history of documented adverse serious drug reaction, Medi Alert bracelet  is recommended   Macrodantin [nitrofurantoin Macrocrystal] Other (See Comments)   Numbness, aching all over   Other Shortness Of Breath, Rash, Other (See Comments)   Pine nuts & walnuts throat congestion. No documented angioedema.   Propoxyphene Hives, Swelling, Rash, Other (See Comments)   Propoxyphene Hcl Hives, Swelling, Rash   Augmentin [amoxicillin-pot Clavulanate] Itching   Dulaglutide Other (See Comments)   Other reaction(s): weak, ha, no appetite   Latex Rash, Other (See Comments)   Metformin Hcl Other (See Comments)   Other reaction(s): leg discomfort   Neosporin  [bacitracin-polymyxin B] Other (See Comments)   Blisters   Zoster Vac Recomb Adjuvanted Rash, Other (See Comments)   Other reaction(s): confusion   Avocado Rash   Banana Rash   Pine nuts   Ciprofloxacin Nausea Only, Other (See Comments)   Nausea (Note: Patient takes this when on mission trips, however)   Tizanidine Other (See Comments)   Reaction not recalled   Tramadol Nausea Only        Medication List    azelastine 0.1 % nasal spray Commonly known as: ASTELIN Place into both nostrils 2 (two) times daily. Use in each nostril as directed   b complex vitamins capsule daily.   cetirizine 10 MG tablet Commonly known as: ZYRTEC Take 1 tablet (10 mg total) by mouth daily as needed for allergies (Can take an extra dose during flare ups.).   Cholecalciferol 25 MCG (1000 UT) capsule Take 1,000 Units by mouth daily.   clotrimazole-betamethasone cream Commonly known as: Lotrisone Apply 1 Application topically 2 (two) times daily. Use for 2 weeks as needed.  Apply to area twice weekly at bedtime for maintenance dosing.   cycloSPORINE 0.05 % ophthalmic emulsion Commonly known as: RESTASIS Place 1 drop into both eyes 2 (two) times daily.   diclofenac 0.1 % ophthalmic solution Commonly known as: VOLTAREN 4 (four) times daily as needed.   DropSafe Alcohol Prep 70 % Pads Apply topically.   Eliquis 5 MG Tabs tablet Generic drug: apixaban TAKE 1 TABLET BY MOUTH TWICE  DAILY  estradiol 0.1 MG/GM vaginal cream Commonly known as: ESTRACE Place 1 Applicatorful vaginally once a week. 1-2 weekly   fluorometholone 0.1 % ophthalmic suspension Commonly known as: FML Place 1 drop into both eyes every Monday, Wednesday, and Friday.   fluticasone 50 MCG/ACT nasal spray Commonly known as: FLONASE Place 1 spray into both nostrils 2 (two) times daily.   folic acid 400 MCG tablet Commonly known as: FOLVITE daily.   freestyle lancets Check blood sugar once daily as directed  DX:790.29 (FREESTYLE FREEDOM LITE)   furosemide 20 MG tablet Commonly known as: LASIX Take 20 mg by mouth daily.   glipiZIDE 10 MG 24 hr tablet Commonly known as: GLUCOTROL XL Take 15 mg by mouth daily. Takes 15 mg   glucose blood test strip Inject 1 strip into the skin daily.   glucose blood test strip Commonly known as: FREESTYLE LITE CHECK BLOOD SUGAR ONCE DAILY AS DIRECTED.  DX:790.29   ipratropium 0.06 % nasal spray Commonly known as: ATROVENT Place 2 sprays into both nostrils 3 (three) times daily.   Lantus SoloStar 100 UNIT/ML Solostar Pen Generic drug: insulin glargine Inject into the skin.   levothyroxine 25 MCG tablet Commonly known as: SYNTHROID Take 25 mcg by mouth daily.   lidocaine 5 % ointment Commonly known as: XYLOCAINE Apply 1 application topically 3 (three) times daily as needed.   loratadine 10 MG tablet Commonly known as: CLARITIN Take 1 tablet (10 mg total) by mouth daily.   metoprolol tartrate 50 MG tablet Commonly known as: LOPRESSOR Take 1.5 tablets (75 mg total) by mouth 2 (two) times daily. Pt needs to schedule appt with provider by April, 2025 to avoid any missed doses   montelukast 10 MG tablet Commonly known as: SINGULAIR TAKE 1 TABLET BY MOUTH AT  BEDTIME   multivitamin tablet Take 1 tablet by mouth daily. Shaklee brand   nitroGLYCERIN 0.4 MG SL tablet Commonly known as: NITROSTAT Place 1 tablet (0.4 mg total) under the tongue every 5 (five) minutes as needed for chest pain.   pantoprazole 40 MG tablet Commonly known as: PROTONIX Take 1 tablet (40 mg total) by mouth 2 (two) times daily.   rosuvastatin 5 MG tablet Commonly known as: CRESTOR Take 5 mg by mouth See admin instructions. Take 5 mg by mouth at bedtime on Mondays and Thursdays..Currently on hold   traMADol 50 MG tablet Commonly known as: ULTRAM Take 1 tablet 4 times a day by oral route as needed.   trolamine salicylate 10 % cream Commonly known as:  ASPERCREME Apply 1 application topically as needed for muscle pain.   valACYclovir 500 MG tablet Commonly known as: VALTREX Take 1 tablet (500 mg total) by mouth 2 (two) times daily. Take twice a day for thee days as needed for an outbreak.   verapamil 180 MG CR tablet Commonly known as: CALAN-SR Take 180 mg by mouth at bedtime.        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    HTN (hypertension)    Hyperglycemia    Hyperlipidemia    Meningioma (HCC)    Mitral regurgitation 11/10/2022   TTE 11/09/2022: EF 60-65, no RWMA, GR 2 DD, normal PASP (RVSP 35.7), mild LAE, severe RAE, mild to moderate MR, AV sclerosis, RAP 3   Myelodysplasia, 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   Nephrolithiasis    Dr Luster Salters, [facility], Hx of [3][   Polyp of colon    Radiculopathy     Past Surgical History:  Procedure Laterality Date   ABDOMINAL HYSTERECTOMY     TAH/BSO --fibroids, no cancer   BALLOON DILATION N/A 07/20/2014   Procedure: BALLOON DILATION;  Surgeon: Garrett Kallman, MD;  Location: [facility];  Service: Endoscopy;  Laterality: N/A;   BREAST BIOPSY     Right   CARPAL TUNNEL RELEASE     & trigger thumb RUE; Dr Mauricia South   COLONOSCOPY W/ POLYPECTOMY  07/2004   Neg 2011; Dr Wonda Hay   CYSTOSCOPY  11/2009   Neg   ESOPHAGOGASTRODUODENOSCOPY N/A 07/20/2014   Procedure: ESOPHAGOGASTRODUODENOSCOPY (EGD);  Surgeon: Garrett Kallman, MD;   Location: [facility];  Service: Endoscopy;  Laterality: N/A;   FLEXIBLE BRONCHOSCOPY W/ UPPER ENDOSCOPY     neg   KNEE ARTHROSCOPY     Left   ROTATOR CUFF REPAIR     R shoulder   TONSILLECTOMY     TRIGGER FINGER RELEASE Right    Right Thumb    Review of systems negative except as noted in HPI / PMHx or noted below:  Review of Systems  Constitutional: Negative.   HENT: Negative.    Eyes: Negative.   Respiratory: Negative.    Cardiovascular: Negative.   Gastrointestinal: Negative.   Genitourinary: Negative.   Musculoskeletal: Negative.   Skin: Negative.   Neurological: Negative.   Endo/Heme/Allergies: Negative.   Psychiatric/Behavioral: Negative.       Objective:   Vitals:   02/12/24 1338  BP: 136/60  Pulse: 69  Resp: 14  Temp: 98 F (36.7 C)  SpO2: 98%   Height: 5' 3.25" (160.7 cm)  Weight: 170 lb (77.1 kg)   Physical Exam Constitutional:      Appearance: She is not diaphoretic.  HENT:     Head: Normocephalic.     Right Ear: Tympanic membrane, ear canal and external ear normal.     Left Ear: Tympanic membrane, ear canal and external ear normal.     Nose: Nose normal. No mucosal edema or rhinorrhea.     Mouth/Throat:     Pharynx: Uvula midline. No oropharyngeal exudate.  Eyes:     Conjunctiva/sclera: Conjunctivae normal.  Neck:     Thyroid: No thyromegaly.     Trachea: Trachea normal. No tracheal tenderness or tracheal deviation.  Cardiovascular:     Rate and Rhythm: Normal rate and regular rhythm.     Heart sounds: Normal heart sounds, S1 normal and S2 normal. No murmur heard. Pulmonary:     Effort: No respiratory distress.     Breath sounds: Normal breath sounds. No stridor. No wheezing or rales.  Lymphadenopathy:     Head:     Right side of head: No tonsillar adenopathy.     Left side of head: No tonsillar adenopathy.     Cervical: No cervical adenopathy.  Skin:    Findings: No erythema or rash.     Nails:  There is no clubbing.   Neurological:     Mental Status: She is alert.     Diagnostics: none   Assessment and Plan:   1. Seasonal allergic rhinitis due to pollen   2. LPRD (laryngopharyngeal reflux disease)   3. 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 Continue avoidance - tree nuts, pine nuts, banana, avocado, tree, grass, weed  2. Continue to Treat and prevent inflammation:   A. Flonase 1-2 sprays each nostril one time per day  B. montelukast 10 mg tablet once a day  3. Continue to Treat LPR:   A. Continue to consolidate all caffeine and chocolate use  B. Continue to replace throat clearing with swallowing maneuver  C. Pantoprazole 40 mg - 1 tablet 1-2 times per day  4. If needed:   A. nasal saline  B. OTC antihistamine - Cetirizine (Zyrtec)  C. Epi-Pen  D. Can add OTC Mucinex two times per day  E. Ipratropium 0.06% 2 sprays each nostril every 6 hours   5. For this recent event:   A. Prednisone 10 mg - 1/2 tablet daily x 7 days (blood sugars)  6. Influenza = Tamiflu. Covid = Paxlovid  7. Return to clinic in 6 months or earlier if problem  Vertis appears to have a little inflammation as she is going through the spring season affecting her upper airway and we will give her a very low-dose of systemic steroids and she will need to be cognizant about monitoring her blood sugars while utilizing the steroid.  She can also increase her dose of ipratropium to every 6 hours should be required for her chronic runny nose.  And she will continue to treat her LPR as noted above.  Assuming she does well with this plan I will see her back in this clinic in 6 months or earlier if there is a problem.  Schuyler Custard, MD Allergy / Immunology [facility]

## 2024-02-13 ENCOUNTER — Encounter: Payer: Self-pay | Admitting: Allergy and Immunology

## 2024-02-15 ENCOUNTER — Telehealth: Payer: Self-pay

## 2024-02-15 ENCOUNTER — Inpatient Hospital Stay: Admitting: Family

## 2024-02-15 ENCOUNTER — Inpatient Hospital Stay

## 2024-02-15 ENCOUNTER — Inpatient Hospital Stay: Attending: Hematology & Oncology

## 2024-02-15 ENCOUNTER — Encounter: Payer: Self-pay | Admitting: Family

## 2024-02-15 VITALS — BP 135/62 | HR 70 | Temp 97.7°F | Resp 18 | Ht 63.0 in | Wt 170.8 lb

## 2024-02-15 DIAGNOSIS — D500 Iron deficiency anemia secondary to blood loss (chronic): Secondary | ICD-10-CM | POA: Diagnosis not present

## 2024-02-15 DIAGNOSIS — K909 Intestinal malabsorption, unspecified: Secondary | ICD-10-CM | POA: Diagnosis present

## 2024-02-15 DIAGNOSIS — D508 Other iron deficiency anemias: Secondary | ICD-10-CM | POA: Insufficient documentation

## 2024-02-15 DIAGNOSIS — Z79899 Other long term (current) drug therapy: Secondary | ICD-10-CM | POA: Diagnosis not present

## 2024-02-15 DIAGNOSIS — D4620 Refractory anemia with excess of blasts, unspecified: Secondary | ICD-10-CM | POA: Insufficient documentation

## 2024-02-15 DIAGNOSIS — D46Z Other myelodysplastic syndromes: Secondary | ICD-10-CM

## 2024-02-15 LAB — CBC WITH DIFFERENTIAL (CANCER CENTER ONLY)
Abs Immature Granulocytes: 0.03 10*3/uL (ref 0.00–0.07)
Basophils Absolute: 0 10*3/uL (ref 0.0–0.1)
Basophils Relative: 0 %
Eosinophils Absolute: 0 10*3/uL (ref 0.0–0.5)
Eosinophils Relative: 0 %
HCT: 36.8 % (ref 36.0–46.0)
Hemoglobin: 12 g/dL (ref 12.0–15.0)
Immature Granulocytes: 1 %
Lymphocytes Relative: 75 %
Lymphs Abs: 2 10*3/uL (ref 0.7–4.0)
MCH: 28.6 pg (ref 26.0–34.0)
MCHC: 32.6 g/dL (ref 30.0–36.0)
MCV: 87.8 fL (ref 80.0–100.0)
Monocytes Absolute: 0.4 10*3/uL (ref 0.1–1.0)
Monocytes Relative: 14 %
Neutro Abs: 0.3 10*3/uL — CL (ref 1.7–7.7)
Neutrophils Relative %: 10 %
Platelet Count: 149 10*3/uL — ABNORMAL LOW (ref 150–400)
RBC: 4.19 MIL/uL (ref 3.87–5.11)
RDW: 14.7 % (ref 11.5–15.5)
WBC Count: 2.7 10*3/uL — ABNORMAL LOW (ref 4.0–10.5)
nRBC: 0 % (ref 0.0–0.2)

## 2024-02-15 LAB — RETICULOCYTES
Immature Retic Fract: 7.7 % (ref 2.3–15.9)
RBC.: 4.18 MIL/uL (ref 3.87–5.11)
Retic Count, Absolute: 32.6 10*3/uL (ref 19.0–186.0)
Retic Ct Pct: 0.8 % (ref 0.4–3.1)

## 2024-02-15 LAB — CMP (CANCER CENTER ONLY)
ALT: 10 U/L (ref 0–44)
AST: 14 U/L — ABNORMAL LOW (ref 15–41)
Albumin: 4 g/dL (ref 3.5–5.0)
Alkaline Phosphatase: 88 U/L (ref 38–126)
Anion gap: 8 (ref 5–15)
BUN: 17 mg/dL (ref 8–23)
CO2: 29 mmol/L (ref 22–32)
Calcium: 10.1 mg/dL (ref 8.9–10.3)
Chloride: 101 mmol/L (ref 98–111)
Creatinine: 0.76 mg/dL (ref 0.44–1.00)
GFR, Estimated: >60 mL/min (ref >60)
Glucose, Bld: 166 mg/dL — ABNORMAL HIGH (ref 70–99)
Potassium: 4.4 mmol/L (ref 3.5–5.1)
Sodium: 138 mmol/L (ref 135–145)
Total Bilirubin: 0.3 mg/dL (ref 0.0–1.2)
Total Protein: 7.7 g/dL (ref 6.5–8.1)

## 2024-02-15 LAB — IRON AND IRON BINDING CAPACITY (CC-WL,HP ONLY)
Iron: 158 ug/dL (ref 28–170)
Saturation Ratios: 67 % — ABNORMAL HIGH (ref 10.4–31.8)
TIBC: 235 ug/dL — ABNORMAL LOW (ref 250–450)
UIBC: 77 ug/dL — ABNORMAL LOW (ref 148–442)

## 2024-02-15 LAB — FERRITIN: Ferritin: 992 ng/mL — ABNORMAL HIGH (ref 11–307)

## 2024-02-15 NOTE — Telephone Encounter (Signed)
 Critical valueof ANC 0.3 received from the lab. Kennard Pea, NP aware. No new orders received.

## 2024-02-15 NOTE — Progress Notes (Signed)
 Hematology and Oncology Follow Up Visit  Kristen Murray [id number] Mar 20, 1946 78 y.o. 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 She is doing well but has had a sinus headache with the change in weather.  No fever, chills, n/v, cough, rash, dizziness, SOB, chest pain, palpitations, abdominal pain or changes in bowel or bladder habits.  She has not noted any blood loss, bruising or petechiae.  No swelling, tenderness, numbness or tingling in her extremities.  No falls or syncope.  Appetite and hydration are good. Weight is stable at 170 lbs.   ECOG Performance Status: 1 - Symptomatic but completely ambulatory  Medications:  Allergies as of 02/15/2024       Reactions   Codeine Hives, Other (See Comments)   Because of a history of documented adverse serious drug reaction, Medi Alert bracelet  is recommended   Macrodantin  [nitrofurantoin  Macrocrystal] Other (See Comments)   Numbness, aching all over   Other Shortness Of Breath, Rash, Other (See Comments)   Pine nuts & walnuts throat congestion. No documented angioedema.   Propoxyphene Hives, Swelling, Rash, Other (See Comments)   Propoxyphene Hcl Hives, Swelling, Rash   Augmentin  [amoxicillin -pot Clavulanate] Itching   Dulaglutide Other (See Comments)   Other reaction(s): weak, ha, no appetite   Latex Rash, Other (See Comments)   Metformin  Hcl Other (See Comments)   Other reaction(s): leg discomfort   Neosporin [bacitracin-polymyxin B] Other (See Comments)   Blisters   Zoster Vac Recomb Adjuvanted Rash, Other (See Comments)   Other reaction(s): confusion   Avocado Rash   Banana Rash   Pine nuts   Ciprofloxacin  Nausea Only, Other  (See Comments)   Nausea (Note: Patient takes this when on mission trips, however)   Tizanidine Other (See Comments)   Reaction not recalled   Tramadol  Nausea Only        Medication List        Accurate as of February 15, 2024  1:08 PM. If you have any questions, ask your nurse or doctor.          azelastine 0.1 % nasal spray Commonly known as: ASTELIN Place into both nostrils 2 (two) times daily. Use in each nostril as directed   b complex vitamins capsule daily.   cetirizine  10 MG tablet Commonly known as: ZYRTEC  Take 1 tablet (10 mg total) by mouth daily as needed for allergies (Can take an extra dose during flare ups.).   Cholecalciferol 25 MCG (1000 UT) capsule Take 1,000 Units by mouth daily.   clotrimazole -betamethasone  cream Commonly known as: Lotrisone  Apply 1 Application topically 2 (two) times daily. Use for 2 weeks as needed.  Apply to area twice weekly at bedtime for maintenance dosing.   cycloSPORINE 0.05 % ophthalmic emulsion Commonly known as: RESTASIS Place 1 drop into both eyes 2 (two) times daily.   diclofenac 0.1 % ophthalmic solution Commonly known as: VOLTAREN 4 (four) times daily as needed.   DropSafe Alcohol Prep 70 % Pads Apply topically.   Eliquis  5 MG Tabs tablet Generic drug: apixaban  TAKE 1 TABLET BY MOUTH TWICE  DAILY   EPINEPHrine  0.3 mg/0.3 mL Soaj injection Commonly  known as: EpiPen  2-Pak Inject 0.3 mg into the muscle as needed for anaphylaxis.   estradiol 0.1 MG/GM vaginal cream Commonly known as: ESTRACE Place 1 Applicatorful vaginally once a week. 1-2 weekly   fluorometholone 0.1 % ophthalmic suspension Commonly known as: FML Place 1 drop into both eyes every Monday, Wednesday, and Friday.   fluticasone  50 MCG/ACT nasal spray Commonly known as: FLONASE  Place 1 spray into both nostrils 2 (two) times daily.   folic acid 400 MCG tablet Commonly known as: FOLVITE daily.   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   glipiZIDE 10 MG 24 hr tablet Commonly known as: GLUCOTROL XL Take 15 mg by mouth daily. Takes 15 mg   glucose blood test strip Inject 1 strip into the skin daily.   glucose blood test strip Commonly known as: FREESTYLE LITE CHECK BLOOD SUGAR ONCE DAILY AS DIRECTED.  [id number]   ipratropium 0.06 % nasal spray Commonly known as: ATROVENT  Place 2 sprays into both nostrils 3 (three) times daily.   Lantus SoloStar 100 UNIT/ML Solostar Pen Generic drug: insulin  glargine Inject into the skin.   levothyroxine 25 MCG tablet Commonly known as: SYNTHROID Take 25 mcg by mouth daily.   lidocaine  5 % ointment Commonly known as: XYLOCAINE  Apply 1 application topically 3 (three) times daily as needed.   loratadine  10 MG tablet Commonly known as: CLARITIN  Take 1 tablet (10 mg total) by mouth daily.   metoprolol  tartrate 50 MG tablet Commonly known as: LOPRESSOR  Take 1.5 tablets (75 mg total) by mouth 2 (two) times daily. Pt needs to schedule appt with provider by April, 2025 to avoid any missed doses   montelukast  10 MG tablet Commonly known as: SINGULAIR  Take 1 tablet (10 mg total) by mouth at bedtime.   multivitamin tablet Take 1 tablet by mouth daily. Shaklee brand   nitroGLYCERIN  0.4 MG SL tablet Commonly known as: NITROSTAT  Place 1 tablet (0.4 mg total) under the tongue every 5 (five) minutes as needed for chest pain.   pantoprazole  40 MG tablet Commonly known as: PROTONIX  Take 1 tablet (40 mg total) by mouth 2 (two) times daily.   predniSONE  10 MG tablet Commonly known as: DELTASONE  Take 0.5 tablets (5 mg total) by mouth daily with breakfast for 7 days.   rosuvastatin 5 MG tablet Commonly known as: CRESTOR Take 5 mg by mouth See admin instructions. Take 5 mg by mouth at bedtime on Mondays and Thursdays..Currently on hold   traMADol   50 MG tablet Commonly known as: ULTRAM  Take 1 tablet 4 times a day by oral route as needed.   trolamine salicylate 10 % cream Commonly known as: ASPERCREME Apply 1 application topically as needed for muscle pain.   valACYclovir  500 MG tablet Commonly known as: VALTREX  Take 1 tablet (500 mg total) by mouth 2 (two) times daily. Take twice a day for thee days as needed for an outbreak.   verapamil  180 MG CR tablet Commonly known as: CALAN -SR Take 180 mg by mouth at bedtime.        Allergies:  Allergies  Allergen Reactions   Codeine Hives and Other (See Comments)    Because of a history of documented adverse serious drug reaction, Medi Alert bracelet  is recommended   Macrodantin  [Nitrofurantoin  Macrocrystal] Other (See Comments)    Numbness, aching all over   Other Shortness Of Breath,  Rash and Other (See Comments)    Pine nuts & walnuts throat congestion. No documented angioedema.    Propoxyphene Hives, Swelling, Rash and Other (See Comments)   Propoxyphene Hcl Hives, Swelling and Rash   Augmentin  [Amoxicillin -Pot Clavulanate] Itching   Dulaglutide Other (See Comments)    Other reaction(s): weak, ha, no appetite   Latex Rash and Other (See Comments)   Metformin  Hcl Other (See Comments)    Other reaction(s): leg discomfort   Neosporin [Bacitracin-Polymyxin B] Other (See Comments)    Blisters   Zoster Vac Recomb Adjuvanted Rash and Other (See Comments)    Other reaction(s): confusion   Avocado Rash   Banana Rash    Pine nuts   Ciprofloxacin  Nausea Only and Other (See Comments)    Nausea (Note: Patient takes this when on mission trips, however)   Tizanidine Other (See Comments)    Reaction not recalled   Tramadol  Nausea Only    Past Medical History, Surgical history, Social history, and Family History were reviewed and updated.  Review of Systems: All other 10 point review of systems is negative.   Physical Exam:  vitals were not taken for this visit.   Wt  Readings from Last 3 Encounters:  02/12/24 170 lb (77.1 kg)  01/18/24 170 lb (77.1 kg)  01/03/24 170 lb (77.1 kg)    Ocular: Sclerae unicteric, pupils equal, round and reactive to light Ear-nose-throat: Oropharynx clear, dentition fair Lymphatic: No cervical or supraclavicular adenopathy Lungs no rales or rhonchi, good excursion bilaterally Heart regular rate and rhythm, no murmur appreciated Abd soft, nontender, positive bowel sounds MSK no focal spinal tenderness, no joint edema Neuro: non-focal, well-oriented, appropriate affect Breasts: Deferred   Lab Results  Component Value Date   WBC 1.8 (L) 01/18/2024   HGB 11.6 (L) 01/18/2024   HCT 35.8 (L) 01/18/2024   MCV 89.7 01/18/2024   PLT 136 (L) 01/18/2024   Lab Results  Component Value Date   FERRITIN 901 (H) 01/18/2024   IRON  143 01/18/2024   TIBC 237 (L) 01/18/2024   UIBC 94 (L) 01/18/2024   IRONPCTSAT 60 (H) 01/18/2024   Lab Results  Component Value Date   RETICCTPCT 1.1 01/18/2024   RBC 4.03 01/18/2024   RBC 3.99 01/18/2024   No results found for: "KPAFRELGTCHN", "LAMBDASER", "KAPLAMBRATIO" No results found for: "IGGSERUM", "IGA", "IGMSERUM" No results found for: "TOTALPROTELP", "ALBUMINELP", "A1GS", "A2GS", "BETS", "BETA2SER", "GAMS", "MSPIKE", "SPEI"   Chemistry      Component Value Date/Time   NA 138 01/18/2024 1304   NA 138 03/10/2020 1138   K 4.5 01/18/2024 1304   CL 101 01/18/2024 1304   CO2 29 01/18/2024 1304   BUN 11 01/18/2024 1304   BUN 10 03/10/2020 1138   CREATININE 0.72 01/18/2024 1304      Component Value Date/Time   CALCIUM 9.7 01/18/2024 1304   ALKPHOS 79 01/18/2024 1304   AST 14 (L) 01/18/2024 1304   ALT 9 01/18/2024 1304   BILITOT 0.4 01/18/2024 1304       Impression and Plan: Ms. Mcnulty is a very pleasant 78 yo African American female with low-grade MDS, low IPSS-R score.  Iron  studies are pending. We will replace if needed.  No Nplate or ESA needed this visit.  We will see her  again in 1 month.   Kennard Pea, NP 4/18/20251:08 PM

## 2024-02-20 ENCOUNTER — Ambulatory Visit
Admission: RE | Admit: 2024-02-20 | Discharge: 2024-02-20 | Disposition: A | Discharge status: Home / Self Care | Source: Ambulatory Visit | Attending: Physician Assistant

## 2024-02-20 DIAGNOSIS — R590 Localized enlarged lymph nodes: Secondary | ICD-10-CM

## 2024-02-20 MED ORDER — IOPAMIDOL (ISOVUE-300) INJECTION 61%
500.0000 mL | Freq: Once | INTRAVENOUS | Status: AC | PRN
Start: 2024-02-20 — End: 2024-02-20
  Administered 2024-02-20: 80 mL via INTRAVENOUS

## 2024-02-25 ENCOUNTER — Ambulatory Visit: Payer: Medicare Other | Admitting: Podiatry

## 2024-03-13 ENCOUNTER — Other Ambulatory Visit: Payer: Self-pay | Admitting: Physician Assistant

## 2024-03-13 ENCOUNTER — Ambulatory Visit
Admission: RE | Admit: 2024-03-13 | Discharge: 2024-03-13 | Disposition: A | Discharge status: Home / Self Care | Source: Ambulatory Visit | Attending: Physician Assistant | Admitting: Physician Assistant

## 2024-03-13 DIAGNOSIS — R0989 Other specified symptoms and signs involving the circulatory and respiratory systems: Secondary | ICD-10-CM

## 2024-03-15 ENCOUNTER — Observation Stay (HOSPITAL_BASED_OUTPATIENT_CLINIC_OR_DEPARTMENT_OTHER)
Admission: EM | Admit: 2024-03-15 | Discharge: 2024-03-16 | Disposition: A | Discharge status: Home / Self Care | Attending: Internal Medicine | Admitting: Internal Medicine

## 2024-03-15 ENCOUNTER — Emergency Department (HOSPITAL_BASED_OUTPATIENT_CLINIC_OR_DEPARTMENT_OTHER)

## 2024-03-15 ENCOUNTER — Other Ambulatory Visit: Payer: Self-pay

## 2024-03-15 ENCOUNTER — Encounter (HOSPITAL_BASED_OUTPATIENT_CLINIC_OR_DEPARTMENT_OTHER): Payer: Self-pay

## 2024-03-15 DIAGNOSIS — I5032 Chronic diastolic (congestive) heart failure: Secondary | ICD-10-CM | POA: Diagnosis not present

## 2024-03-15 DIAGNOSIS — J189 Pneumonia, unspecified organism: Secondary | ICD-10-CM | POA: Diagnosis not present

## 2024-03-15 DIAGNOSIS — Z1152 Encounter for screening for COVID-19: Secondary | ICD-10-CM | POA: Diagnosis not present

## 2024-03-15 DIAGNOSIS — D469 Myelodysplastic syndrome, unspecified: Secondary | ICD-10-CM | POA: Diagnosis not present

## 2024-03-15 DIAGNOSIS — I700 Atherosclerosis of aorta: Secondary | ICD-10-CM | POA: Diagnosis not present

## 2024-03-15 DIAGNOSIS — R531 Weakness: Secondary | ICD-10-CM

## 2024-03-15 DIAGNOSIS — Z794 Long term (current) use of insulin: Secondary | ICD-10-CM | POA: Diagnosis not present

## 2024-03-15 DIAGNOSIS — I5022 Chronic systolic (congestive) heart failure: Secondary | ICD-10-CM | POA: Insufficient documentation

## 2024-03-15 DIAGNOSIS — I110 Hypertensive heart disease with heart failure: Secondary | ICD-10-CM | POA: Diagnosis not present

## 2024-03-15 DIAGNOSIS — I4892 Unspecified atrial flutter: Secondary | ICD-10-CM | POA: Diagnosis not present

## 2024-03-15 DIAGNOSIS — D46Z Other myelodysplastic syndromes: Secondary | ICD-10-CM | POA: Diagnosis present

## 2024-03-15 DIAGNOSIS — Z7984 Long term (current) use of oral hypoglycemic drugs: Secondary | ICD-10-CM | POA: Insufficient documentation

## 2024-03-15 DIAGNOSIS — K219 Gastro-esophageal reflux disease without esophagitis: Secondary | ICD-10-CM | POA: Diagnosis present

## 2024-03-15 DIAGNOSIS — E785 Hyperlipidemia, unspecified: Secondary | ICD-10-CM | POA: Diagnosis not present

## 2024-03-15 DIAGNOSIS — Z7901 Long term (current) use of anticoagulants: Secondary | ICD-10-CM | POA: Insufficient documentation

## 2024-03-15 DIAGNOSIS — E1165 Type 2 diabetes mellitus with hyperglycemia: Secondary | ICD-10-CM | POA: Diagnosis present

## 2024-03-15 DIAGNOSIS — I10 Essential (primary) hypertension: Secondary | ICD-10-CM | POA: Diagnosis present

## 2024-03-15 DIAGNOSIS — J0100 Acute maxillary sinusitis, unspecified: Secondary | ICD-10-CM | POA: Diagnosis not present

## 2024-03-15 DIAGNOSIS — Z79899 Other long term (current) drug therapy: Secondary | ICD-10-CM | POA: Insufficient documentation

## 2024-03-15 DIAGNOSIS — I5189 Other ill-defined heart diseases: Secondary | ICD-10-CM

## 2024-03-15 DIAGNOSIS — R519 Headache, unspecified: Secondary | ICD-10-CM | POA: Diagnosis present

## 2024-03-15 DIAGNOSIS — R059 Cough, unspecified: Secondary | ICD-10-CM | POA: Diagnosis present

## 2024-03-15 DIAGNOSIS — I2510 Atherosclerotic heart disease of native coronary artery without angina pectoris: Secondary | ICD-10-CM | POA: Diagnosis not present

## 2024-03-15 LAB — CBC WITH DIFFERENTIAL/PLATELET
Abs Immature Granulocytes: 0.04 10*3/uL (ref 0.00–0.07)
Basophils Absolute: 0 10*3/uL (ref 0.0–0.1)
Basophils Relative: 0 %
Eosinophils Absolute: 0 10*3/uL (ref 0.0–0.5)
Eosinophils Relative: 0 %
HCT: 28.7 % — ABNORMAL LOW (ref 36.0–46.0)
Hemoglobin: 9.3 g/dL — ABNORMAL LOW (ref 12.0–15.0)
Immature Granulocytes: 2 %
Lymphocytes Relative: 56 %
Lymphs Abs: 1.4 10*3/uL (ref 0.7–4.0)
MCH: 28.1 pg (ref 26.0–34.0)
MCHC: 32.4 g/dL (ref 30.0–36.0)
MCV: 86.7 fL (ref 80.0–100.0)
Monocytes Absolute: 0.4 10*3/uL (ref 0.1–1.0)
Monocytes Relative: 15 %
Neutro Abs: 0.7 10*3/uL — ABNORMAL LOW (ref 1.7–7.7)
Neutrophils Relative %: 27 %
Platelets: 180 10*3/uL (ref 150–400)
RBC: 3.31 MIL/uL — ABNORMAL LOW (ref 3.87–5.11)
RDW: 15.2 % (ref 11.5–15.5)
WBC: 2.6 10*3/uL — ABNORMAL LOW (ref 4.0–10.5)
nRBC: 0 % (ref 0.0–0.2)

## 2024-03-15 LAB — COMPREHENSIVE METABOLIC PANEL WITH GFR
ALT: 37 U/L (ref 0–44)
AST: 44 U/L — ABNORMAL HIGH (ref 15–41)
Albumin: 3.4 g/dL — ABNORMAL LOW (ref 3.5–5.0)
Alkaline Phosphatase: 92 U/L (ref 38–126)
Anion gap: 13 (ref 5–15)
BUN: 13 mg/dL (ref 8–23)
CO2: 26 mmol/L (ref 22–32)
Calcium: 10.1 mg/dL (ref 8.9–10.3)
Chloride: 97 mmol/L — ABNORMAL LOW (ref 98–111)
Creatinine, Ser: 0.62 mg/dL (ref 0.44–1.00)
GFR, Estimated: >60 mL/min (ref >60)
Glucose, Bld: 319 mg/dL — ABNORMAL HIGH (ref 70–99)
Potassium: 3.7 mmol/L (ref 3.5–5.1)
Sodium: 136 mmol/L (ref 135–145)
Total Bilirubin: 0.2 mg/dL (ref 0.0–1.2)
Total Protein: 7.8 g/dL (ref 6.5–8.1)

## 2024-03-15 LAB — RESP PANEL BY RT-PCR (RSV, FLU A&B, COVID)  RVPGX2
Influenza A by PCR: NEGATIVE
Influenza B by PCR: NEGATIVE
Resp Syncytial Virus by PCR: NEGATIVE
SARS Coronavirus 2 by RT PCR: NEGATIVE

## 2024-03-15 LAB — GLUCOSE, CAPILLARY
Glucose-Capillary: 198 mg/dL — ABNORMAL HIGH (ref 70–99)
Glucose-Capillary: 218 mg/dL — ABNORMAL HIGH (ref 70–99)

## 2024-03-15 LAB — PRO BRAIN NATRIURETIC PEPTIDE: Pro Brain Natriuretic Peptide: 1626 pg/mL — ABNORMAL HIGH (ref <300.0)

## 2024-03-15 LAB — STREP PNEUMONIAE URINARY ANTIGEN: Strep Pneumo Urinary Antigen: NEGATIVE

## 2024-03-15 LAB — LACTIC ACID, PLASMA: Lactic Acid, Venous: 1.9 mmol/L (ref 0.5–1.9)

## 2024-03-15 LAB — TROPONIN T, HIGH SENSITIVITY: Troponin T High Sensitivity: 15 ng/L (ref <19)

## 2024-03-15 MED ORDER — SODIUM CHLORIDE 0.9 % IV SOLN
500.0000 mg | INTRAVENOUS | Status: DC
  Filled 2024-03-15: qty 5

## 2024-03-15 MED ORDER — HYDROCODONE BIT-HOMATROP MBR 5-1.5 MG/5ML PO SOLN: 3.0000 mL | Freq: Four times a day (QID) | ORAL | Status: DC | PRN

## 2024-03-15 MED ORDER — ONDANSETRON HCL 4 MG/2ML IJ SOLN: 4.0000 mg | Freq: Four times a day (QID) | INTRAMUSCULAR | Status: DC | PRN

## 2024-03-15 MED ORDER — ACETAMINOPHEN 650 MG RE SUPP: 650.0000 mg | Freq: Four times a day (QID) | RECTAL | Status: DC | PRN

## 2024-03-15 MED ORDER — LORATADINE 10 MG PO TABS
10.0000 mg | ORAL_TABLET | Freq: Every day | ORAL | Status: DC
  Administered 2024-03-16: 10 mg via ORAL
  Filled 2024-03-15: qty 1

## 2024-03-15 MED ORDER — ONDANSETRON HCL 4 MG PO TABS
4.0000 mg | ORAL_TABLET | Freq: Four times a day (QID) | ORAL | Status: DC | PRN
Start: 2024-03-15 — End: 2024-03-16

## 2024-03-15 MED ORDER — IOHEXOL 350 MG/ML SOLN
75.0000 mL | Freq: Once | INTRAVENOUS | Status: AC | PRN
  Administered 2024-03-15: 75 mL via INTRAVENOUS

## 2024-03-15 MED ORDER — METOPROLOL TARTRATE 25 MG PO TABS
75.0000 mg | ORAL_TABLET | Freq: Two times a day (BID) | ORAL | Status: DC
  Administered 2024-03-15 – 2024-03-16 (×2): 75 mg via ORAL
  Filled 2024-03-15 (×2): qty 1

## 2024-03-15 MED ORDER — METHYLPREDNISOLONE SODIUM SUCC 40 MG IJ SOLR
40.0000 mg | Freq: Once | INTRAMUSCULAR | Status: AC
  Administered 2024-03-15: 40 mg via INTRAVENOUS
  Filled 2024-03-15: qty 1

## 2024-03-15 MED ORDER — GLIPIZIDE ER 10 MG PO TB24
20.0000 mg | ORAL_TABLET | Freq: Every day | ORAL | Status: DC
  Administered 2024-03-16: 20 mg via ORAL
  Filled 2024-03-15 (×2): qty 2

## 2024-03-15 MED ORDER — SODIUM CHLORIDE 0.9 % IV SOLN: 2.0000 g | INTRAVENOUS | Status: DC

## 2024-03-15 MED ORDER — INSULIN GLARGINE-YFGN 100 UNIT/ML ~~LOC~~ SOLN
14.0000 [IU] | Freq: Every day | SUBCUTANEOUS | Status: DC
  Administered 2024-03-15: 14 [IU] via SUBCUTANEOUS
  Filled 2024-03-15 (×2): qty 0.14

## 2024-03-15 MED ORDER — LEVOTHYROXINE SODIUM 25 MCG PO TABS
25.0000 ug | ORAL_TABLET | Freq: Every day | ORAL | Status: DC
  Administered 2024-03-16: 25 ug via ORAL
  Filled 2024-03-15: qty 1

## 2024-03-15 MED ORDER — APIXABAN 5 MG PO TABS
5.0000 mg | ORAL_TABLET | Freq: Two times a day (BID) | ORAL | Status: DC
Start: 2024-03-15 — End: 2024-03-16
  Administered 2024-03-15 – 2024-03-16 (×2): 5 mg via ORAL
  Filled 2024-03-15 (×2): qty 1

## 2024-03-15 MED ORDER — ALBUTEROL SULFATE (2.5 MG/3ML) 0.083% IN NEBU
2.5000 mg | INHALATION_SOLUTION | Freq: Three times a day (TID) | RESPIRATORY_TRACT | Status: DC
  Administered 2024-03-15 – 2024-03-16 (×2): 2.5 mg via RESPIRATORY_TRACT
  Filled 2024-03-15 (×3): qty 3

## 2024-03-15 MED ORDER — INSULIN ASPART 100 UNIT/ML IJ SOLN
0.0000 [IU] | Freq: Three times a day (TID) | INTRAMUSCULAR | Status: DC
  Administered 2024-03-15: 2 [IU] via SUBCUTANEOUS
  Administered 2024-03-16: 9 [IU] via SUBCUTANEOUS
  Administered 2024-03-16: 7 [IU] via SUBCUTANEOUS

## 2024-03-15 MED ORDER — ACETAMINOPHEN 325 MG PO TABS: 650.0000 mg | ORAL_TABLET | Freq: Four times a day (QID) | ORAL | Status: DC | PRN

## 2024-03-15 MED ORDER — MONTELUKAST SODIUM 10 MG PO TABS
10.0000 mg | ORAL_TABLET | Freq: Every day | ORAL | Status: DC
  Administered 2024-03-15: 10 mg via ORAL
  Filled 2024-03-15: qty 1

## 2024-03-15 MED ORDER — IPRATROPIUM-ALBUTEROL 0.5-2.5 (3) MG/3ML IN SOLN: 3.0000 mL | Freq: Three times a day (TID) | RESPIRATORY_TRACT | Status: DC

## 2024-03-15 MED ORDER — MAGNESIUM SULFATE 2 GM/50ML IV SOLN
2.0000 g | Freq: Once | INTRAVENOUS | Status: AC
  Administered 2024-03-15: 2 g via INTRAVENOUS
  Filled 2024-03-15: qty 50

## 2024-03-15 MED ORDER — VERAPAMIL HCL ER 180 MG PO TBCR
180.0000 mg | EXTENDED_RELEASE_TABLET | Freq: Every day | ORAL | Status: DC
  Administered 2024-03-15: 180 mg via ORAL
  Filled 2024-03-15: qty 1

## 2024-03-15 MED ORDER — ROSUVASTATIN CALCIUM 5 MG PO TABS: 5.0000 mg | ORAL_TABLET | ORAL | Status: DC

## 2024-03-15 MED ORDER — PANTOPRAZOLE SODIUM 40 MG PO TBEC
40.0000 mg | DELAYED_RELEASE_TABLET | Freq: Every day | ORAL | Status: DC
  Administered 2024-03-15 – 2024-03-16 (×2): 40 mg via ORAL
  Filled 2024-03-15 (×2): qty 1

## 2024-03-15 MED ORDER — DM-GUAIFENESIN ER 30-600 MG PO TB12
1.0000 | ORAL_TABLET | Freq: Two times a day (BID) | ORAL | Status: DC
  Administered 2024-03-15 – 2024-03-16 (×2): 1 via ORAL
  Filled 2024-03-15 (×2): qty 1

## 2024-03-15 MED ORDER — SODIUM CHLORIDE 0.9 % IV SOLN
1.0000 g | Freq: Once | INTRAVENOUS | Status: AC
  Administered 2024-03-15: 1 g via INTRAVENOUS
  Filled 2024-03-15: qty 10

## 2024-03-15 MED ORDER — ALBUTEROL SULFATE (2.5 MG/3ML) 0.083% IN NEBU
2.5000 mg | INHALATION_SOLUTION | RESPIRATORY_TRACT | Status: DC | PRN
  Administered 2024-03-15: 2.5 mg via RESPIRATORY_TRACT
  Filled 2024-03-15: qty 3

## 2024-03-15 MED ORDER — SODIUM CHLORIDE 0.9 % IV SOLN
500.0000 mg | Freq: Once | INTRAVENOUS | Status: AC
  Administered 2024-03-15: 500 mg via INTRAVENOUS
  Filled 2024-03-15: qty 5

## 2024-03-15 MED ORDER — POTASSIUM CHLORIDE CRYS ER 20 MEQ PO TBCR
40.0000 meq | EXTENDED_RELEASE_TABLET | Freq: Once | ORAL | Status: AC
  Administered 2024-03-15: 40 meq via ORAL
  Filled 2024-03-15: qty 2

## 2024-03-15 MED ORDER — GUAIFENESIN-DM 100-10 MG/5ML PO SYRP
5.0000 mL | ORAL_SOLUTION | ORAL | Status: DC | PRN
  Administered 2024-03-15: 5 mL via ORAL
  Filled 2024-03-15: qty 5

## 2024-03-15 NOTE — ED Notes (Signed)
 Patient transported to X-ray

## 2024-03-15 NOTE — Plan of Care (Signed)

## 2024-03-15 NOTE — H&P (Signed)
 History and Physical    Patient: Kristen Murray ZOX:[id number] DOB: 07/07/1946 DOA: 03/15/2024 DOS: the patient was seen and examined on 03/15/2024 PCP: Karalee Oscar, PA  Patient coming from: Home  Chief Complaint:  Chief Complaint  Patient presents with   Cough   HPI: Kristen Murray is a 78 y.o. female with medical history significant of osteoarthritis, abnormal thyroid  function test, CAD, chronic leukopenia, type 2 diabetes, E. coli UTI, infection, fibromyalgia, GERD, headache,, genital HSV, HSV-1 infection, hypertension, hyperglycemia, hyperlipidemia, meningioma, mitral regurgitation nephrolithiasis, colon polyps, back pain, radiculopathy, low-grade myelodysplasia who has been having sore throat, right-sided earache, cough that is occasionally productive, wheezing, progressively worse dyspnea and fatigue since Sunday.  She went to see her PCP on Thursday and was prescribed treatment for pneumonia.  However, she continues to have worsening of symptoms and decided to go to the emergency department.  She denied fever, chills, rhinorrhea, sore throat, wheezing or hemoptysis.  No chest pain, palpitations, diaphoresis, PND, orthopnea or pitting edema of the lower extremities.  No abdominal pain, nausea, emesis, diarrhea, constipation, melena or hematochezia.  No flank pain, dysuria, frequency or hematuria.  No polyuria, polydipsia, polyphagia or blurred vision.   Lab work: Coronavirus, influenza and RSV PCR test was negative.  CBC showed white count 2.6, hemoglobin 9.3 g/dL platelets under 80.  Lactic acid was normal.  Normal troponin T.  proBNP was 1626.0 pg/mL.  CMP showed chloride of 97 mmol/L, glucose 319 mg/dL, albumin 3.4 g/dL and AST of 44 units/L.  Imaging: 2 view chest radiograph with no active cardiopulmonary disease.  CT head without contrast showing acute on chronic sinusitis.  No acute intracranial process.  Stable calcified right tentorial meningioma with no mass effect.  CTA chest with  no evidence of PE.  There were patchy bilateral groundglass airspace disease and bilateral bronchial wall thickening, consistent with multifocal pneumonia.  Mediastinal and hilar adenopathy likely reactive.  Aortic atherosclerosis.  ED course: Initial vital signs were temperature 98.6 F, pulse 86, respirations 17, BP 156/79 mmHg O2 sat 97% on room air.  Patient received azithromycin 500 mg IVPB ceftriaxone 1 g IVPB.  After arrival to the hospital, I ordered methylprednisolone  40 mg IVP, KCl 40 mEq p.o. and magnesium sulfate 2 g IV.   Review of Systems: As mentioned in the history of present illness. All other systems reviewed and are negative.  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   Nephrolithiasis    Dr Luster Salters, [facility], Hx of [3][   Polyp of colon  Radiculopathy    Past Surgical History:  Procedure Laterality Date   ABDOMINAL HYSTERECTOMY     TAH/BSO --fibroids, no cancer   BALLOON DILATION N/A 07/20/2014   Procedure: BALLOON DILATION;  Surgeon: Garrett Kallman, MD;  Location: [facility];  Service: Endoscopy;  Laterality: N/A;   BREAST  BIOPSY     Right   CARPAL TUNNEL RELEASE     & trigger thumb RUE; Dr Mauricia South   COLONOSCOPY W/ POLYPECTOMY  07/2004   Neg 2011; Dr Wonda Hay   CYSTOSCOPY  11/2009   Neg   ESOPHAGOGASTRODUODENOSCOPY N/A 07/20/2014   Procedure: ESOPHAGOGASTRODUODENOSCOPY (EGD);  Surgeon: Garrett Kallman, MD;  Location: [facility];  Service: Endoscopy;  Laterality: N/A;   FLEXIBLE BRONCHOSCOPY W/ UPPER ENDOSCOPY     neg   KNEE ARTHROSCOPY     Left   ROTATOR CUFF REPAIR     R shoulder   TONSILLECTOMY     TRIGGER FINGER RELEASE Right    Right Thumb   Social History:  reports that she has never smoked. She has never used smokeless tobacco. She reports that she does not drink alcohol and does not use drugs.  Allergies  Allergen Reactions   Codeine Hives and Other (See Comments)    Because of a history of documented adverse serious drug reaction, Medi Alert bracelet  is recommended   Macrodantin  [Nitrofurantoin  Macrocrystal] Other (See Comments)    Numbness, aching all over   Other Shortness Of Breath, Rash and Other (See Comments)    Pine nuts & walnuts throat congestion. No documented angioedema.    Propoxyphene Hives, Swelling, Rash and Other (See Comments)   Propoxyphene Hcl Hives, Swelling and Rash   Augmentin  [Amoxicillin -Pot Clavulanate] Itching   Dulaglutide Other (See Comments)    Other reaction(s): weak, ha, no appetite   Latex Rash and Other (See Comments)   Metformin  Hcl Other (See Comments)    Other reaction(s): leg discomfort   Neosporin [Bacitracin-Polymyxin B] Other (See Comments)    Blisters   Zoster Vac Recomb Adjuvanted Rash and Other (See Comments)    Other reaction(s): confusion   Avocado Rash   Banana Rash    Pine nuts   Ciprofloxacin  Nausea Only and Other (See Comments)    Nausea (Note: Patient takes this when on mission trips, however)   Tizanidine Other (See Comments)    Reaction not recalled   Tramadol  Nausea Only    Family History  Problem Relation Age  of Onset   Alcohol abuse Mother    Other Father        unknown   Diabetes Maternal Aunt    Diabetes Maternal Uncle    Tuberculosis Maternal Uncle    Diabetes Maternal Grandmother    Coronary artery disease Neg Hx     Prior to Admission medications   Medication Sig Start Date End Date Taking? Authorizing Provider  Alcohol Swabs (DROPSAFE ALCOHOL PREP) 70 % PADS Apply topically. 10/19/22   [provider]  azelastine (ASTELIN) 0.1 % nasal spray Place into both nostrils 2 (two) times daily. Use in each nostril as directed    [provider]  b complex vitamins capsule daily.    [provider]  cetirizine  (ZYRTEC ) 10 MG tablet Take 1 tablet (10 mg total) by mouth daily as needed for allergies (Can take an extra dose during flare ups.). 02/12/24   Kozlow, Rema Care, MD  Cholecalciferol 25 MCG (1000 UT) capsule Take 1,000 Units by mouth daily.  [provider]  clotrimazole -betamethasone  (LOTRISONE ) cream Apply 1 Application topically 2 (two) times daily. Use for 2 weeks as needed.  Apply to area twice weekly at bedtime for maintenance dosing. 02/27/23   Amundson C Silva, Brook E, MD  cycloSPORINE (RESTASIS) 0.05 % ophthalmic emulsion Place 1 drop into both eyes 2 (two) times daily. 02/05/24   [provider]  diclofenac (VOLTAREN) 0.1 % ophthalmic solution 4 (four) times daily as needed.    [provider]  ELIQUIS  5 MG TABS tablet TAKE 1 TABLET BY MOUTH TWICE  DAILY 05/31/23   Nahser, Lela Purple, MD  EPINEPHrine  (EPIPEN  2-PAK) 0.3 mg/0.3 mL IJ SOAJ injection Inject 0.3 mg into the muscle as needed for anaphylaxis. Patient not taking: Reported on 02/15/2024 02/12/24   Kozlow, Eric J, MD  estradiol (ESTRACE) 0.1 MG/GM vaginal cream Place 1 Applicatorful vaginally once a week. 1-2 weekly 11/27/23   [provider]  fluorometholone (FML) 0.1 % ophthalmic suspension Place 1 drop into both eyes every Monday, Wednesday, and Friday.     [provider]  fluticasone  (FLONASE ) 50 MCG/ACT nasal spray Place 1 spray into both nostrils 2 (two) times daily. 02/12/24   Kozlow, Rema Care, MD  folic acid (FOLVITE) 400 MCG tablet daily.    [provider]  furosemide (LASIX) 20 MG tablet Take 20 mg by mouth daily. 05/10/23   [provider]  glipiZIDE (GLUCOTROL XL) 10 MG 24 hr tablet Take 15 mg by mouth daily. Takes 15 mg 05/05/22   [provider]  glucose blood (FREESTYLE LITE) test strip CHECK BLOOD SUGAR ONCE DAILY AS DIRECTED.  DX:790.29 01/09/14   Alinda Apley, MD  glucose blood test strip Inject 1 strip into the skin daily.    [provider]  ipratropium (ATROVENT ) 0.06 % nasal spray Place 2 sprays into both nostrils 3 (three) times daily. 05/30/22   Kozlow, Rema Care, MD  Lancets (FREESTYLE) lancets Check blood sugar once daily as directed DX:790.29 (FREESTYLE FREEDOM LITE) 01/09/14   Alinda Apley, MD  LANTUS SOLOSTAR 100 UNIT/ML Solostar Pen Inject into the skin. 10/19/23   [provider]  levothyroxine (SYNTHROID) 25 MCG tablet Take 25 mcg by mouth daily. 10/19/23   [provider]  lidocaine  (XYLOCAINE ) 5 % ointment Apply 1 application topically 3 (three) times daily as needed. 06/06/21   Greta Leatherwood, MD  loratadine  (CLARITIN ) 10 MG tablet Take 1 tablet (10 mg total) by mouth daily. 01/10/20   Haviland, Julie, MD  metoprolol  tartrate (LOPRESSOR ) 50 MG tablet Take 1.5 tablets (75 mg total) by mouth 2 (two) times daily. Pt needs to schedule appt with provider by April, 2025 to avoid any missed doses 11/27/23   Nahser, Lela Purple, MD  montelukast  (SINGULAIR ) 10 MG tablet Take 1 tablet (10 mg total) by mouth at bedtime. 02/12/24   Kozlow, Rema Care, MD  Multiple Vitamin (MULTIVITAMIN) tablet Take 1 tablet by mouth daily. Shaklee brand    [provider]  nitroGLYCERIN  (NITROSTAT ) 0.4 MG SL tablet Place 1 tablet (0.4 mg total) under the tongue every 5 (five) minutes as  needed for chest pain. Patient not taking: Reported on 02/15/2024 01/11/21   Marlyse Single T, PA-C  pantoprazole  (PROTONIX ) 40 MG tablet Take 1 tablet (40 mg total) by mouth 2 (two) times daily. 02/12/24   Kozlow, Rema Care, MD  rosuvastatin (CRESTOR) 5 MG tablet Take 5 mg by mouth See admin instructions. Take 5 mg by mouth at  bedtime on Mondays and Thursdays..Currently on hold 11/20/19   [provider]  traMADol  (ULTRAM ) 50 MG tablet Take 1 tablet 4 times a day by oral route as needed. 04/02/23   [provider]  trolamine salicylate (ASPERCREME) 10 % cream Apply 1 application topically as needed for muscle pain.    [provider]  valACYclovir  (VALTREX ) 500 MG tablet Take 1 tablet (500 mg total) by mouth 2 (two) times daily. Take twice a day for thee days as needed for an outbreak. 02/27/23   Greta Leatherwood, MD  verapamil  (CALAN -SR) 180 MG CR tablet Take 180 mg by mouth at bedtime. 01/02/17   [provider]    Physical Exam: Vitals:   03/15/24 1031 03/15/24 1032 03/15/24 1200  BP:  (!) 156/79 133/68  Pulse:  86 79  Resp:  17 (!) 21  Temp:  98.6 F (37 C)   TempSrc:  Oral   SpO2:  97% 96%  Weight: 75.8 kg    Height: 5\' 5"  (1.651 m)     Physical Exam Vitals and nursing note reviewed.  Constitutional:      General: She is awake. She is not in acute distress.    Appearance: She is ill-appearing.     Interventions: Nasal cannula in place.  HENT:     Head: Normocephalic.     Nose: No rhinorrhea.     Mouth/Throat:     Mouth: Mucous membranes are dry.  Eyes:     General: No scleral icterus.    Pupils: Pupils are equal, round, and reactive to light.  Neck:     Vascular: No JVD.  Cardiovascular:     Rate and Rhythm: Normal rate and regular rhythm.     Heart sounds: S1 normal and S2 normal.  Pulmonary:     Breath sounds: Wheezing, rhonchi and rales present.  Abdominal:     General: Bowel sounds are normal. There is no distension.      Palpations: Abdomen is soft.     Tenderness: There is no abdominal tenderness. There is no guarding.  Musculoskeletal:     Cervical back: Neck supple.     Right lower leg: No edema.     Left lower leg: No edema.  Skin:    General: Skin is warm and dry.  Neurological:     General: No focal deficit present.     Mental Status: She is alert and oriented to person, place, and time.  Psychiatric:        Mood and Affect: Mood normal.        Behavior: Behavior normal. Behavior is cooperative.     Data Reviewed:  Results are pending, will review when available. 11/08/2023 echocardiogram.  IMPRESSIONS:   1. Left ventricular ejection fraction, by estimation, is 60 to 65%. The  left ventricle has normal function. The left ventricle has no regional  wall motion abnormalities. Left ventricular diastolic parameters are  consistent with Grade II diastolic  dysfunction (pseudonormalization). Elevated left ventricular end-diastolic  pressure.   2. Right ventricular systolic function is normal. The right ventricular  size is normal.   3. Left atrial size was mildly dilated.   4. The mitral valve is degenerative. Mild mitral valve regurgitation. No  evidence of mitral stenosis. Moderate mitral annular calcification.   5. The aortic valve is tricuspid. Aortic valve regurgitation is not  visualized. Aortic valve sclerosis/calcification is present, without any  evidence of aortic stenosis.   6. The inferior vena  cava is normal in size with greater than 50%  respiratory variability, suggesting right atrial pressure of 3 mmHg.   7. There is right bowing of the interatrial septum, suggestive of  elevated left atrial pressure. No atrial level shunt detected by color  flow Doppler.   EKG: Vent. rate 84 BPM PR interval 140 ms QRS duration 77 ms QT/QTcB 363/430 ms P-R-T axes 59 60 32 Sinus rhythm Consider left atrial enlargement Low voltage, precordial leads  Assessment and Plan: Principal  Problem:   Community acquired pneumonia Admit to PCU/inpatient. Continue supplemental oxygen. Scheduled and as needed bronchodilators. Continue ceftriaxone 1 g IVPB daily. Continue azithromycin 500 mg IVPB daily. Continue loratadine  and montelukast . Check strep pneumoniae urinary antigen. Check sputum Gram stain, culture and sensitivity. Follow-up blood culture and sensitivity. Follow-up CBC and chemistry in the morning.  Active Problems:   Myelodysplasia, low grade (HCC) Follow-up CBC in AM.    Hyperlipidemia Aortic atherosclerosis (HCC) Continue rosuvastatin 5 mg p.o. daily.    Essential hypertension Continue metoprolol  75 mg p.o. twice daily. Continue verapamil  180 mg p.o. bedtime.    Atrial flutter, paroxysmal (HCC) CHA?DS?-VASc Score of at least 7. Continue metoprolol  and verapamil . Continue apixaban  5 mg p.o. twice daily.    CAD (coronary artery disease) Continue apixaban , metoprolol  and statin. Follow-up with cardiology as an outpatient.    Grade II diastolic dysfunction  No signs of decompensation. Continue metoprolol  as above.    Hyperglycemia due to type 2 diabetes mellitus (HCC) Carbohydrate modified diet. Continue Lantus 14 units SQ at bedtime. Continue glipizide XL 20 mg p.o. daily. CBG monitoring with RI SS. Check hemoglobin A1c.    GERD Continue pantoprazole  40 mg p.o. daily.   Advance Care Planning:   Code Status: Full Code   Consults:   Family Communication:   Severity of Illness: The appropriate patient status for this patient is INPATIENT. Inpatient status is judged to be reasonable and necessary in order to provide the required intensity of service to ensure the patient's safety. The patient's presenting symptoms, physical exam findings, and initial radiographic and laboratory data in the context of their chronic comorbidities is felt to place them at high risk for further clinical deterioration. Furthermore, it is not anticipated that the  patient will be medically stable for discharge from the hospital within 2 midnights of admission.   * I certify that at the point of admission it is my clinical judgment that the patient will require inpatient hospital care spanning beyond 2 midnights from the point of admission due to high intensity of service, high risk for further deterioration and high frequency of surveillance required.*  Author: Danice Dural, MD 03/15/2024 3:28 PM  For on call review [email or web address].   This document was prepared using Dragon voice recognition software and may contain some unintended transcription errors.

## 2024-03-15 NOTE — ED Provider Notes (Signed)
 [place] [facility] Provider Note   CSN: [id number] Arrival date & time: 03/15/24  1017     History  Chief Complaint  Patient presents with   Cough    Kristen Murray is a 78 y.o. female.  HPI      78 year old female with a history of hypertension, diabetes, coronary artery disease, hyperlipidemia, meningioma, myelodysplasia, iron  deficiency anemia, who presents with concern for cough  She saw her PCP on Thursday and was prescribed cefdinir, azithromycin, promethazine cough syrup.  She is here as symptoms have continued despite her antibiotics and treatment.   Cough has been severe, keeping her up at night.  Has also developed right ear pain and pain to right side of face.  Has some associated shortness of breath.   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   Nephrolithiasis    Dr Luster Salters, [facility], Hx of [3][   Polyp of colon    Radiculopathy     Past Surgical History:  Procedure Laterality Date   ABDOMINAL HYSTERECTOMY      TAH/BSO --fibroids, no cancer   BALLOON DILATION N/A 07/20/2014   Procedure: BALLOON DILATION;  Surgeon: Garrett Kallman, MD;  Location: [facility];  Service: Endoscopy;  Laterality: N/A;   BREAST BIOPSY     Right   CARPAL TUNNEL RELEASE     & trigger thumb RUE; Dr Mauricia South   COLONOSCOPY W/ POLYPECTOMY  07/2004   Neg 2011; Dr Wonda Hay   CYSTOSCOPY  11/2009   Neg   ESOPHAGOGASTRODUODENOSCOPY N/A 07/20/2014   Procedure: ESOPHAGOGASTRODUODENOSCOPY (EGD);  Surgeon: Garrett Kallman, MD;  Location: [facility];  Service: Endoscopy;  Laterality: N/A;   FLEXIBLE BRONCHOSCOPY W/ UPPER ENDOSCOPY     neg   KNEE ARTHROSCOPY     Left   ROTATOR CUFF REPAIR     R shoulder   TONSILLECTOMY     TRIGGER FINGER RELEASE Right    Right Thumb     Home Medications Prior to Admission medications   Medication Sig Start Date End Date Taking? Authorizing Provider  ASCORBIC ACID PO Take 1 tablet by mouth daily. Dosage unknown   Yes [provider]  azelastine (ASTELIN) 0.1 % nasal spray Place into both nostrils 2 (two) times daily. Use in each nostril as directed   Yes [provider]  b complex vitamins capsule Take 1 capsule by mouth daily.   Yes [provider]  cetirizine  (ZYRTEC ) 10 MG tablet Take 1 tablet (10 mg total) by mouth daily as needed for allergies (Can take an extra dose during flare ups.). 02/12/24  Yes Kozlow, Eric J, MD  Cholecalciferol 25 MCG (1000 UT) capsule Take 1,000 Units by mouth daily.   Yes [provider]  cycloSPORINE (RESTASIS) 0.05 % ophthalmic emulsion Place 1 drop into both eyes every Monday, Wednesday, and Friday. 02/05/24  Yes [provider]  diclofenac (VOLTAREN) 0.1 % ophthalmic solution Place 1 drop into both eyes 4 (four) times daily as needed.   Yes [provider]  ELIQUIS  5 MG TABS tablet TAKE 1 TABLET BY MOUTH TWICE  DAILY 05/31/23  Yes Nahser, Lela Purple, MD  EPINEPHrine  (EPIPEN  2-PAK) 0.3 mg/0.3 mL IJ SOAJ  injection Inject 0.3 mg into the muscle as needed for anaphylaxis. 02/12/24  Yes Kozlow, Eric J, MD  estradiol (ESTRACE) 0.1 MG/GM vaginal cream Place 1 Applicatorful vaginally once a week. 1-2 weekly 11/27/23  Yes [provider]  fluorometholone (FML) 0.1 % ophthalmic suspension Place 1 drop into both eyes every Monday, Wednesday, and Friday.    Yes [provider]  fluticasone  (FLONASE ) 50 MCG/ACT nasal spray Place 1 spray into both nostrils 2 (two) times daily. 02/12/24  Yes Kozlow, Eric J, MD  furosemide (LASIX) 20 MG tablet Take 20 mg by mouth daily. 05/10/23  Yes [provider]  glipiZIDE (GLUCOTROL XL) 10 MG 24 hr tablet Take 20 mg by mouth daily. 05/05/22  Yes [provider]  LANTUS SOLOSTAR 100 UNIT/ML Solostar Pen Inject 10 Units into the skin daily. 10/19/23  Yes [provider]  levothyroxine (SYNTHROID) 25 MCG tablet Take 25 mcg by mouth daily. 10/19/23  Yes [provider]  lidocaine  (XYLOCAINE ) 5 % ointment Apply 1 application topically 3 (three) times daily as needed. 06/06/21  Yes Greta Leatherwood, MD  metoprolol  tartrate (LOPRESSOR ) 50 MG tablet Take 1.5 tablets (75 mg total) by mouth 2 (two) times daily. Pt needs to schedule appt with provider by April, 2025 to avoid any missed doses 11/27/23  Yes Nahser, Lela Purple, MD  montelukast  (SINGULAIR ) 10 MG tablet Take 1 tablet (10 mg total) by mouth at bedtime. 02/12/24  Yes Kozlow, Eric J, MD  Multiple Vitamin (MULTIVITAMIN) tablet Take 1 tablet by mouth daily. [employer] brand   Yes [provider]  nitroGLYCERIN  (NITROSTAT ) 0.4 MG SL tablet Place 1 tablet (0.4 mg total) under the tongue every 5 (five) minutes as needed for chest pain. 01/11/21  Yes Weaver, Scott T, PA-C  pantoprazole  (PROTONIX ) 40 MG tablet Take 1 tablet (40 mg total) by mouth 2 (two) times daily. 02/12/24  Yes Kozlow, Eric J, MD  trolamine salicylate (ASPERCREME) 10 % cream Apply 1 application topically as  needed for muscle pain.   Yes [provider]  valACYclovir  (VALTREX ) 500 MG tablet Take 1 tablet (500 mg total) by mouth 2 (two) times daily. Take twice a day for thee days as needed for an outbreak. 02/27/23  Yes Amundson Nohemi Batters, MD  verapamil  (CALAN -SR) 180 MG CR tablet Take 180 mg by mouth at bedtime. 01/02/17  Yes [provider]  Alcohol Swabs (DROPSAFE ALCOHOL PREP) 70 % PADS Apply topically. 10/19/22   [provider]  azithromycin (ZITHROMAX) 250 MG tablet Take 250-500 mg by mouth See admin instructions. Take 500mg  (2 tablets) by mouth once daily on day one, then 250mg  (1 tablet) once daily for 5 days Patient not  taking: Reported on 03/15/2024 03/13/24   [provider]  cefdinir (OMNICEF) 300 MG capsule Take 300 mg by mouth 2 (two) times daily. Patient not taking: Reported on 03/15/2024 03/13/24 03/23/24  [provider]  glucose blood (FREESTYLE LITE) test strip CHECK BLOOD SUGAR ONCE DAILY AS DIRECTED.  DX:790.29 01/09/14   Alinda Apley, MD  glucose blood test strip Inject 1 strip into the skin daily.    [provider]  Lancets (FREESTYLE) lancets Check blood sugar once daily as directed DX:790.29 (FREESTYLE FREEDOM LITE) 01/09/14   Alinda Apley, MD      Allergies    Codeine, Macrodantin  [nitrofurantoin  macrocrystal], Other, Propoxyphene, Propoxyphene hcl, Augmentin  [amoxicillin -pot clavulanate], Dulaglutide, Latex, Metformin  hcl, Neosporin [bacitracin-polymyxin b], Zoster vac recomb adjuvanted, Avocado, Banana, Ciprofloxacin , Tizanidine, and Tramadol     Review of Systems   Review of Systems  Physical Exam Updated Vital Signs BP (!) 149/62 (BP Location: Left Arm)   Pulse (!) 101   Temp 98.5 F (36.9 C)   Resp 20   Ht 5\' 5"  (1.651 m)   Wt 75.8 kg   LMP 07/30/1990   SpO2 96%   BMI 27.79 kg/m  Physical Exam Vitals and nursing note reviewed.  Constitutional:      General: She is not in acute distress.     Appearance: She is well-developed. She is not diaphoretic.  HENT:     Head: Normocephalic and atraumatic.  Eyes:     Conjunctiva/sclera: Conjunctivae normal.  Cardiovascular:     Rate and Rhythm: Normal rate and regular rhythm.     Heart sounds: Normal heart sounds. No murmur heard.    No friction rub. No gallop.  Pulmonary:     Effort: Pulmonary effort is normal. No respiratory distress.     Breath sounds: Rhonchi present. No wheezing or rales.     Comments: Frequent cough  Abdominal:     General: There is no distension.     Palpations: Abdomen is soft.     Tenderness: There is no abdominal tenderness. There is no guarding.  Musculoskeletal:        General: No tenderness.     Cervical back: Normal range of motion.  Skin:    General: Skin is warm and dry.     Findings: No erythema or rash.  Neurological:     Mental Status: She is alert and oriented to person, place, and time.     ED Results / Procedures / Treatments   Labs (all labs ordered are listed, but only abnormal results are displayed) Labs Reviewed  CBC WITH DIFFERENTIAL/PLATELET - Abnormal; Notable for the following components:      Result Value   WBC 2.6 (*)    RBC 3.31 (*)    Hemoglobin 9.3 (*)    HCT 28.7 (*)    Neutro Abs 0.7 (*)    All other components within normal limits  COMPREHENSIVE METABOLIC PANEL WITH GFR - Abnormal; Notable for the following components:   Chloride 97 (*)    Glucose, Bld 319 (*)    Albumin 3.4 (*)    AST 44 (*)    All other components within normal limits  PRO BRAIN NATRIURETIC PEPTIDE - Abnormal; Notable for the following components:   Pro Brain Natriuretic Peptide 1,626.0 (*)    All other components within normal limits  GLUCOSE, CAPILLARY - Abnormal; Notable for the following components:   Glucose-Capillary 198 (*)    All other components within normal limits  GLUCOSE, CAPILLARY -  Abnormal; Notable for the following components:   Glucose-Capillary 218 (*)    All other  components within normal limits  RESP PANEL BY RT-PCR (RSV, FLU A&B, COVID)  RVPGX2  CULTURE, BLOOD (ROUTINE X 2)  CULTURE, BLOOD (ROUTINE X 2)  EXPECTORATED SPUTUM ASSESSMENT W GRAM STAIN, RFLX TO RESP C  LACTIC ACID, PLASMA  STREP PNEUMONIAE URINARY ANTIGEN  CBC  COMPREHENSIVE METABOLIC PANEL WITH GFR  HEMOGLOBIN A1C  TROPONIN T, HIGH SENSITIVITY    EKG EKG Interpretation Date/Time:  Saturday Mar 15 2024 10:41:57 EDT Ventricular Rate:  84 PR Interval:  140 QRS Duration:  77 QT Interval:  363 QTC Calculation: 430 R Axis:   60  Text Interpretation: Sinus rhythm Consider left atrial enlargement Low voltage, precordial leads No significant change since last tracing Confirmed by Scarlette Currier ([id number]) on 03/15/2024 1:20:14 PM  Radiology CT Angio Chest PE W and/or Wo Contrast Result Date: 03/15/2024 CLINICAL DATA:  Cough, pneumonia, short of breath EXAM: CT ANGIOGRAPHY CHEST WITH CONTRAST TECHNIQUE: Multidetector CT imaging of the chest was performed using the standard protocol during bolus administration of intravenous contrast. Multiplanar CT image reconstructions and MIPs were obtained to evaluate the vascular anatomy. RADIATION DOSE REDUCTION: This exam was performed according to the departmental dose-optimization program which includes automated exposure control, adjustment of the mA and/or kV according to patient size and/or use of iterative reconstruction technique. CONTRAST:  75mL OMNIPAQUE  IOHEXOL  350 MG/ML SOLN COMPARISON:  05/25/2021 FINDINGS: Cardiovascular: This is a technically adequate evaluation of the pulmonary vasculature. No filling defects or pulmonary emboli. No evidence of cardiomegaly or pericardial effusion. Calcification of the mitral annulus. Normal caliber of the thoracic aorta. Atherosclerosis of the aortic arch. Mediastinum/Nodes: Mediastinal lymphadenopathy, with index lymph node in the subcarinal region measuring 18 mm in short axis and within the AP window  measuring 15 mm in short axis. Borderline enlarged bilateral hilar lymph nodes also noted. Thyroid , trachea, and esophagus are unremarkable. Lungs/Pleura: There is patchy ground-glass airspace disease within the right upper lobe and bilateral lower lobes. Bilateral bronchial wall thickening greatest at the lung bases. No effusion or pneumothorax. Upper Abdomen: No acute abnormality. Musculoskeletal: No acute or destructive bony abnormalities. Reconstructed images demonstrate no additional findings. Review of the MIP images confirms the above findings. IMPRESSION: 1. No evidence of pulmonary embolus. 2. Patchy bilateral ground-glass airspace disease and bilateral bronchial wall thickening, consistent with multifocal pneumonia. 3. Mediastinal and hilar adenopathy likely reactive. 4.  Aortic Atherosclerosis (ICD10-I70.0). Electronically Signed   By: Bobbye Burrow M.D.   On: 03/15/2024 13:10   CT Head Wo Contrast Result Date: 03/15/2024 CLINICAL DATA:  Headache, right ear ache EXAM: CT HEAD WITHOUT CONTRAST TECHNIQUE: Contiguous axial images were obtained from the base of the skull through the vertex without intravenous contrast. RADIATION DOSE REDUCTION: This exam was performed according to the departmental dose-optimization program which includes automated exposure control, adjustment of the mA and/or kV according to patient size and/or use of iterative reconstruction technique. COMPARISON:  05/13/2022 FINDINGS: Brain: No acute infarct or hemorrhage. Lateral ventricles and midline structures are unremarkable. No acute extra-axial fluid collections. 1.2 x 1.2 x 1.0 cm calcified lesion along the inferior aspect of the right tentorium compatible with calcified meningioma, unchanged since prior study. No mass effect. Vascular: No hyperdense vessel or unexpected calcification. Skull: Normal. Negative for fracture or focal lesion. Sinuses/Orbits: Mucoperiosteal thickening throughout the frontal, ethmoid, and maxillary  sinuses, with small gas fluid levels in the right frontal sinus and bilateral maxillary sinuses.  Other: None. IMPRESSION: 1. Acute on chronic sinusitis. 2. No acute intracranial process. 3. Stable calcified right tentorial meningioma.  No mass effect. Electronically Signed   By: Bobbye Burrow M.D.   On: 03/15/2024 13:06   DG Chest 2 View Result Date: 03/15/2024 CLINICAL DATA:  Cough. EXAM: CHEST - 2 VIEW COMPARISON:  Mar 13, 2024. FINDINGS: The heart size and mediastinal contours are within normal limits. Both lungs are clear. The visualized skeletal structures are unremarkable. IMPRESSION: No active cardiopulmonary disease. Electronically Signed   By: Rosalene Colon M.D.   On: 03/15/2024 11:20    Procedures Procedures    Medications Ordered in ED Medications  cefTRIAXone (ROCEPHIN) 2 g in sodium chloride  0.9 % 100 mL IVPB (has no administration in time range)  azithromycin (ZITHROMAX) 500 mg in sodium chloride  0.9 % 250 mL IVPB (has no administration in time range)  acetaminophen  (TYLENOL ) tablet 650 mg (has no administration in time range)    Or  acetaminophen  (TYLENOL ) suppository 650 mg (has no administration in time range)  ondansetron  (ZOFRAN ) tablet 4 mg (has no administration in time range)    Or  ondansetron  (ZOFRAN ) injection 4 mg (has no administration in time range)  insulin  aspart (novoLOG ) injection 0-9 Units (2 Units Subcutaneous Given 03/15/24 1747)  albuterol  (PROVENTIL ) (2.5 MG/3ML) 0.083% nebulizer solution 2.5 mg (2.5 mg Nebulization Not Given 03/15/24 1958)  dextromethorphan-guaiFENesin  (MUCINEX  DM) 30-600 MG per 12 hr tablet 1 tablet (1 tablet Oral Given 03/15/24 1746)  albuterol  (PROVENTIL ) (2.5 MG/3ML) 0.083% nebulizer solution 2.5 mg (2.5 mg Nebulization Given 03/15/24 1958)  apixaban  (ELIQUIS ) tablet 5 mg (5 mg Oral Given 03/15/24 2126)  insulin  glargine-yfgn (SEMGLEE) injection 14 Units (14 Units Subcutaneous Given 03/15/24 2126)  glipiZIDE (GLUCOTROL XL) 24 hr tablet  20 mg (has no administration in time range)  levothyroxine (SYNTHROID) tablet 25 mcg (has no administration in time range)  montelukast  (SINGULAIR ) tablet 10 mg (10 mg Oral Given 03/15/24 2126)  rosuvastatin (CRESTOR) tablet 5 mg (has no administration in time range)  loratadine  (CLARITIN ) tablet 10 mg (has no administration in time range)  metoprolol  tartrate (LOPRESSOR ) tablet 75 mg (75 mg Oral Given 03/15/24 2126)  pantoprazole  (PROTONIX ) EC tablet 40 mg (40 mg Oral Given 03/15/24 2126)  verapamil  (CALAN -SR) CR tablet 180 mg (180 mg Oral Given 03/15/24 2126)  guaiFENesin -dextromethorphan (ROBITUSSIN DM) 100-10 MG/5ML syrup 5 mL (5 mLs Oral Given 03/15/24 2125)  iohexol  (OMNIPAQUE ) 350 MG/ML injection 75 mL (75 mLs Intravenous Contrast Given 03/15/24 1300)  cefTRIAXone (ROCEPHIN) 1 g in sodium chloride  0.9 % 100 mL IVPB (1 g Intravenous New Bag/Given 03/15/24 1414)  azithromycin (ZITHROMAX) 500 mg in sodium chloride  0.9 % 250 mL IVPB (0 mg Intravenous Stopped 03/15/24 1512)  potassium chloride SA (KLOR-CON M) CR tablet 40 mEq (40 mEq Oral Given 03/15/24 1746)  methylPREDNISolone  sodium succinate (SOLU-MEDROL ) 40 mg/mL injection 40 mg (40 mg Intravenous Given 03/15/24 1747)  magnesium sulfate IVPB 2 g 50 mL (2 g Intravenous New Bag/Given 03/15/24 1850)    ED Course/ Medical Decision Making/ A&P                                   78 year old female with a history of hypertension, diabetes, coronary artery disease, hyperlipidemia, meningioma, myelodysplasia, iron  deficiency anemia, who presents with concern for cough and dyspnea.  Differential diagnosis includes viral etiologies, COPD/asthma, pneumonia, pneumonia that is failing oral antibiotics, anemia, electrolyte  abnormalities, ACS, congestive heart failure, PE.   Given on ddx is pneumonia worsening despite appropriate oral abx, ordered blood cultures, lactic acid as well as other labs to evaluate for etiologies of dyspnea.  Given husband's  concern that she has been not acting herself, slow-moving, and presence of headache, CT head was done which showed no evidence of acute bleed.  Does show acute on chronic sinusitis and stable meningioma.  EKG completed and personally evaluated by me shows normal sinus rhythm without acute ST changes.  CXR evaluated by me shows no radiographic evidence of pneumonia on chest x-ray.  Labs completed and personally evaluated and interpreted by me shows leukopenia which has been stable, neutropenia decreased from prior in April, anemia that is slightly worse.  Platelets have improved.  CMP shows mild hyperglycemia without other complications and no clinically significant electrolyte abnormalities.  Troponin is negative and doubt ACS.  Her proBNP is very mildly elevated without prior for comparison.  COVID, flu and RSV testing were done to evaluate for these as etiology of her cough and they were negative.  Given worsening symptoms in the setting of taking appropriate antibiotics for pneumonia, chest x-ray not showing abnormalities, ordered a CT PE study to evaluate for underlying PE or occult pneumonia which shows no evidence of PE but does show multifocal pneumonia mediastinal and hilar adenopathy.  Given failure of outpatient antibiotics, worsening of her weakness symptoms, generalized weakness, will admit for treatment of community-acquired pneumonia.  Given Rocephin and azithromycin.           Final Clinical Impression(s) / ED Diagnoses Final diagnoses:  Community acquired pneumonia, unspecified laterality  Generalized weakness  Acute maxillary sinusitis, recurrence not specified    Rx / DC Orders ED Discharge Orders     None         Scarlette Currier, MD 03/15/24 2250

## 2024-03-15 NOTE — ED Triage Notes (Signed)
 Per pt she went to see her PCP on Thursday and was dx with Pneumonia. Has been taking the medications, with no relief. Coughing has kept her from sleeping through the night, right earache, shortness of breath. SP02 100% RA.   Anastacio Balm, RN

## 2024-03-15 NOTE — Progress Notes (Signed)
 Plan of Care Note for accepted transfer   Patient: Kristen Murray MRN: [id number]   DOA: 03/15/2024  Facility requesting transfer: [facility].  Requesting Provider: Scarlette Currier, MD Reason for transfer: Community-acquired pneumonia. Facility course:  78 year old female with a past medical history of A-fib, paroxysmal flutter, seasonal allergies, osteoarthritis, CAD,  hypertension, GERD, headaches, migraine, temporal arteritis, hyperlipidemia, type 2 diabetes, iron  deficiency anemia, meningioma, lumbar spondylosis, spinal stenosis, chronic leukopenia, low-grade myelodysplasia, nontoxic thyroid  nodule, nephrolithiasis, herpes zoster who was diagnosed by her PCP with pneumonia 2 days ago.  She was given oral treatment, but her symptoms had worsened.  Imaging shows patchy bilateral groundglass airspace disease and bilateral bronchial wall thickening, consistent with multifocal pneumonia.  She had received ceftriaxone and azithromycin.  Plan of care: The patient is accepted for admission to Telemetry unit, at [facility].  Author: Danice Dural, MD 03/15/2024  Check [email or web address] for on-call coverage.  Nursing staff, Please call [facility] Admits & Consults System-Wide number on Amion as soon as patient's arrival, so appropriate admitting provider can evaluate the pt.

## 2024-03-16 DIAGNOSIS — J189 Pneumonia, unspecified organism: Secondary | ICD-10-CM | POA: Diagnosis not present

## 2024-03-16 LAB — COMPREHENSIVE METABOLIC PANEL WITH GFR
ALT: 43 U/L (ref 0–44)
AST: 39 U/L (ref 15–41)
Albumin: 2.8 g/dL — ABNORMAL LOW (ref 3.5–5.0)
Alkaline Phosphatase: 78 U/L (ref 38–126)
Anion gap: 9 (ref 5–15)
BUN: 20 mg/dL (ref 8–23)
CO2: 22 mmol/L (ref 22–32)
Calcium: 9.3 mg/dL (ref 8.9–10.3)
Chloride: 102 mmol/L (ref 98–111)
Creatinine, Ser: 0.6 mg/dL (ref 0.44–1.00)
GFR, Estimated: >60 mL/min (ref >60)
Glucose, Bld: 353 mg/dL — ABNORMAL HIGH (ref 70–99)
Potassium: 4.5 mmol/L (ref 3.5–5.1)
Sodium: 133 mmol/L — ABNORMAL LOW (ref 135–145)
Total Bilirubin: 0.6 mg/dL (ref 0.0–1.2)
Total Protein: 7.6 g/dL (ref 6.5–8.1)

## 2024-03-16 LAB — CBC
HCT: 32 % — ABNORMAL LOW (ref 36.0–46.0)
Hemoglobin: 9.6 g/dL — ABNORMAL LOW (ref 12.0–15.0)
MCH: 28.1 pg (ref 26.0–34.0)
MCHC: 30 g/dL (ref 30.0–36.0)
MCV: 93.6 fL (ref 80.0–100.0)
Platelets: 182 10*3/uL (ref 150–400)
RBC: 3.42 MIL/uL — ABNORMAL LOW (ref 3.87–5.11)
RDW: 15.5 % (ref 11.5–15.5)
WBC: 1.8 10*3/uL — ABNORMAL LOW (ref 4.0–10.5)
nRBC: 0 % (ref 0.0–0.2)

## 2024-03-16 LAB — HEMOGLOBIN A1C
Hgb A1c MFr Bld: 8.9 % — ABNORMAL HIGH (ref 4.8–5.6)
Mean Plasma Glucose: 208.73 mg/dL

## 2024-03-16 LAB — GLUCOSE, CAPILLARY
Glucose-Capillary: 361 mg/dL — ABNORMAL HIGH (ref 70–99)
Glucose-Capillary: 341 mg/dL — ABNORMAL HIGH (ref 70–99)

## 2024-03-16 MED ORDER — ROSUVASTATIN CALCIUM 5 MG PO TABS: 5.0000 mg | ORAL_TABLET | Freq: Every day | ORAL | 0 refills | Status: AC

## 2024-03-16 MED ORDER — DM-GUAIFENESIN ER 30-600 MG PO TB12: 1.0000 | ORAL_TABLET | Freq: Two times a day (BID) | ORAL | 0 refills | Status: AC

## 2024-03-16 MED ORDER — HYDROCOD POLI-CHLORPHE POLI ER 10-8 MG/5ML PO SUER: 5.0000 mL | Freq: Two times a day (BID) | ORAL | 0 refills | Status: DC | PRN

## 2024-03-16 NOTE — Care Management Obs Status (Signed)
 MEDICARE OBSERVATION STATUS NOTIFICATION   Patient Details  Name: Kristen Murray MRN: [id number] Date of Birth: 08-Oct-1946   Medicare Observation Status Notification Given:  Yes    Kylil Swopes Liane Redman, LCSW 03/16/2024, 10:23 AM

## 2024-03-16 NOTE — Plan of Care (Signed)
  Problem: Education: Goal: Knowledge of General Education information will improve Description: Including pain rating scale, medication(s)/side effects and non-pharmacologic comfort measures Outcome: Completed/Met   Problem: Health Behavior/Discharge Planning: Goal: Ability to manage health-related needs will improve Outcome: Completed/Met   Problem: Clinical Measurements: Goal: Ability to maintain clinical measurements within normal limits will improve Outcome: Completed/Met Goal: Will remain free from infection Outcome: Completed/Met Goal: Diagnostic test results will improve Outcome: Completed/Met Goal: Respiratory complications will improve Outcome: Completed/Met Goal: Cardiovascular complication will be avoided Outcome: Completed/Met   Problem: Activity: Goal: Risk for activity intolerance will decrease Outcome: Completed/Met   Problem: Nutrition: Goal: Adequate nutrition will be maintained Outcome: Completed/Met   Problem: Coping: Goal: Level of anxiety will decrease Outcome: Completed/Met   Problem: Elimination: Goal: Will not experience complications related to bowel motility Outcome: Completed/Met Goal: Will not experience complications related to urinary retention Outcome: Completed/Met   Problem: Pain Managment: Goal: General experience of comfort will improve and/or be controlled Outcome: Completed/Met   Problem: Safety: Goal: Ability to remain free from injury will improve Outcome: Completed/Met   Problem: Skin Integrity: Goal: Risk for impaired skin integrity will decrease Outcome: Completed/Met   Problem: Activity: Goal: Ability to tolerate increased activity will improve Outcome: Completed/Met   Problem: Clinical Measurements: Goal: Ability to maintain a body temperature in the normal range will improve Outcome: Completed/Met   Problem: Respiratory: Goal: Ability to maintain adequate ventilation will improve Outcome: Completed/Met Goal:  Ability to maintain a clear airway will improve Outcome: Completed/Met   Problem: Education: Goal: Ability to describe self-care measures that may prevent or decrease complications (Diabetes Survival Skills Education) will improve Outcome: Completed/Met Goal: Individualized Educational Video(s) Outcome: Completed/Met   Problem: Coping: Goal: Ability to adjust to condition or change in health will improve Outcome: Completed/Met   Problem: Fluid Volume: Goal: Ability to maintain a balanced intake and output will improve Outcome: Completed/Met   Problem: Health Behavior/Discharge Planning: Goal: Ability to identify and utilize available resources and services will improve Outcome: Completed/Met Goal: Ability to manage health-related needs will improve Outcome: Completed/Met   Problem: Metabolic: Goal: Ability to maintain appropriate glucose levels will improve Outcome: Completed/Met   Problem: Nutritional: Goal: Maintenance of adequate nutrition will improve Outcome: Completed/Met Goal: Progress toward achieving an optimal weight will improve Outcome: Completed/Met   Problem: Skin Integrity: Goal: Risk for impaired skin integrity will decrease Outcome: Completed/Met   Problem: Tissue Perfusion: Goal: Adequacy of tissue perfusion will improve Outcome: Completed/Met

## 2024-03-16 NOTE — Care Management CC44 (Signed)
 Condition Code 44 Documentation Completed  Patient Details  Name: Kristen Murray MRN: [id number] Date of Birth: Nov 21, 1945   Condition Code 44 given:  Yes Patient signature on Condition Code 44 notice:  Yes Documentation of 2 MD's agreement:  Yes Code 44 added to claim:  Yes    Katrine Parody, LCSW 03/16/2024, 10:23 AM

## 2024-03-16 NOTE — Discharge Summary (Signed)
 Physician Discharge Summary  [name]:[id number] DOB: 12/12/1945 DOA: 03/15/2024  PCP: Karalee Oscar, PA  Admit date: 03/15/2024 Discharge date: 03/16/2024  Admitted From: Home Disposition: Home  Recommendations for Outpatient Follow-up:  Follow up with PCP as scheduled later this week  Home Health: None Equipment/Devices: None  Discharge Condition: Stable CODE STATUS: Full Diet recommendation: Low fat low-carb diet as tolerated  Brief/Interim Summary: Kristen Murray is a 78 y.o. female with medical history significant of osteoarthritis, CAD, chronic leukopenia, type 2 diabetes, fibromyalgia, GERD, hypertension, hyperlipidemia, mitral regurgitation, low-grade myelodysplasia who has been having sore throat, right-sided earache, cough that is occasionally productive, wheezing, progressively worse dyspnea and fatigue since Sunday.  Patient admitted as above with concerns over worsening pneumonia given her symptoms.  Now patient has drastically improved over the past 24 hours, she does not require IV antibiotics as she is taking p.o. well, she is not requiring oxygen, even with exertion, if she is otherwise stable and agreeable for discharge home.  Patient's main complaint appears to be her "intractable cough" as such we will send home additional cough medications as below, patient educated on using Tussionex sparingly and that it may carry side effects including but not limited to dizziness, increased fall risk, somnolence.   Discharge Diagnoses:  Principal Problem:   Community acquired pneumonia Active Problems:   Hyperlipidemia   Essential hypertension   GERD   Atrial flutter, paroxysmal (HCC)   Myelodysplasia, low grade (HCC)   CAD (coronary artery disease)   Hyperglycemia due to type 2 diabetes mellitus (HCC)   Aortic atherosclerosis (HCC)   Grade II diastolic dysfunction    Discharge Instructions  Discharge Instructions     Call MD for:  difficulty breathing,  headache or visual disturbances   Complete by: As directed    Call MD for:  extreme fatigue   Complete by: As directed    Call MD for:  hives   Complete by: As directed    Call MD for:  persistant dizziness or light-headedness   Complete by: As directed    Call MD for:  persistant nausea and vomiting   Complete by: As directed    Call MD for:  severe uncontrolled pain   Complete by: As directed    Call MD for:  temperature >100.4   Complete by: As directed    Diet - low sodium heart healthy   Complete by: As directed    Increase activity slowly   Complete by: As directed       Allergies as of 03/16/2024       Reactions   Codeine Hives, Other (See Comments)   Because of a history of documented adverse serious drug reaction, Medi Alert bracelet  is recommended   Macrodantin  [nitrofurantoin  Macrocrystal] Other (See Comments)   Numbness, aching all over   Other Shortness Of Breath, Rash, Other (See Comments)   Pine nuts & walnuts throat congestion. No documented angioedema.   Propoxyphene Hives, Swelling, Rash, Other (See Comments)   Propoxyphene Hcl Hives, Swelling, Rash   Augmentin  [amoxicillin -pot Clavulanate] Itching   Dulaglutide Other (See Comments)   Other reaction(s): weak, ha, no appetite   Latex Rash, Other (See Comments)   Metformin  Hcl Other (See Comments)   Other reaction(s): leg discomfort   Neosporin [bacitracin-polymyxin B] Other (See Comments)   Blisters   Zoster Vac Recomb Adjuvanted Rash, Other (See Comments)   Other reaction(s): confusion   Avocado Rash   Banana Rash   [facility]  nuts   Ciprofloxacin  Nausea Only, Other (See Comments)   Nausea (Note: Patient takes this when on mission trips, however)   Tizanidine Other (See Comments)   Reaction not recalled   Tramadol  Nausea Only        Medication List     TAKE these medications    ASCORBIC ACID PO Take 1 tablet by mouth daily. Dosage unknown   azelastine 0.1 % nasal spray Commonly known as:  ASTELIN Place into both nostrils 2 (two) times daily. Use in each nostril as directed   azithromycin 250 MG tablet Commonly known as: ZITHROMAX Take 250-500 mg by mouth See admin instructions. Take 500mg  (2 tablets) by mouth once daily on day one, then 250mg  (1 tablet) once daily for 5 days   b complex vitamins capsule Take 1 capsule by mouth daily.   cefdinir 300 MG capsule Commonly known as: OMNICEF Take 300 mg by mouth 2 (two) times daily.   cetirizine  10 MG tablet Commonly known as: ZYRTEC  Take 1 tablet (10 mg total) by mouth daily as needed for allergies (Can take an extra dose during flare ups.).   chlorpheniramine-HYDROcodone  10-8 MG/5ML Commonly known as: TUSSIONEX Take 5 mLs by mouth every 12 (twelve) hours as needed for cough.   Cholecalciferol 25 MCG (1000 UT) capsule Take 1,000 Units by mouth daily.   cycloSPORINE 0.05 % ophthalmic emulsion Commonly known as: RESTASIS Place 1 drop into both eyes every Monday, Wednesday, and Friday.   dextromethorphan-guaiFENesin  30-600 MG 12hr tablet Commonly known as: MUCINEX  DM Take 1 tablet by mouth 2 (two) times daily.   diclofenac 0.1 % ophthalmic solution Commonly known as: VOLTAREN Place 1 drop into both eyes 4 (four) times daily as needed.   DropSafe Alcohol Prep 70 % Pads Apply topically.   Eliquis  5 MG Tabs tablet Generic drug: apixaban  TAKE 1 TABLET BY MOUTH TWICE  DAILY   EPINEPHrine  0.3 mg/0.3 mL Soaj injection Commonly known as: EpiPen  2-Pak Inject 0.3 mg into the muscle as needed for anaphylaxis.   estradiol 0.1 MG/GM vaginal cream Commonly known as: ESTRACE Place 1 Applicatorful vaginally once a week. 1-2 weekly   fluorometholone 0.1 % ophthalmic suspension Commonly known as: FML Place 1 drop into both eyes every Monday, Wednesday, and Friday.   fluticasone  50 MCG/ACT nasal spray Commonly known as: FLONASE  Place 1 spray into both nostrils 2 (two) times daily.   freestyle lancets Check blood  sugar once daily as directed DX:790.29 (FREESTYLE FREEDOM LITE)   furosemide 20 MG tablet Commonly known as: LASIX Take 20 mg by mouth daily.   glipiZIDE 10 MG 24 hr tablet Commonly known as: GLUCOTROL XL Take 20 mg by mouth daily.   glucose blood test strip Inject 1 strip into the skin daily.   glucose blood test strip Commonly known as: FREESTYLE LITE CHECK BLOOD SUGAR ONCE DAILY AS DIRECTED.  DX:790.29   Lantus SoloStar 100 UNIT/ML Solostar Pen Generic drug: insulin  glargine Inject 10 Units into the skin daily.   levothyroxine 25 MCG tablet Commonly known as: SYNTHROID Take 25 mcg by mouth daily.   lidocaine  5 % ointment Commonly known as: XYLOCAINE  Apply 1 application topically 3 (three) times daily as needed.   metoprolol  tartrate 50 MG tablet Commonly known as: LOPRESSOR  Take 1.5 tablets (75 mg total) by mouth 2 (two) times daily. Pt needs to schedule appt with provider by April, 2025 to avoid any missed doses   montelukast  10 MG tablet Commonly known as: SINGULAIR  Take 1 tablet (10  mg total) by mouth at bedtime.   multivitamin tablet Take 1 tablet by mouth daily. Shaklee brand   nitroGLYCERIN  0.4 MG SL tablet Commonly known as: NITROSTAT  Place 1 tablet (0.4 mg total) under the tongue every 5 (five) minutes as needed for chest pain.   pantoprazole  40 MG tablet Commonly known as: PROTONIX  Take 1 tablet (40 mg total) by mouth 2 (two) times daily.   rosuvastatin 5 MG tablet Commonly known as: CRESTOR Take 1 tablet (5 mg total) by mouth daily. What changed:  when to take this additional instructions   trolamine salicylate 10 % cream Commonly known as: ASPERCREME Apply 1 application topically as needed for muscle pain.   valACYclovir  500 MG tablet Commonly known as: VALTREX  Take 1 tablet (500 mg total) by mouth 2 (two) times daily. Take twice a day for thee days as needed for an outbreak.   verapamil  180 MG CR tablet Commonly known as: CALAN -SR Take  180 mg by mouth at bedtime.        Allergies  Allergen Reactions   Codeine Hives and Other (See Comments)    Because of a history of documented adverse serious drug reaction, Medi Alert bracelet  is recommended   Macrodantin  [Nitrofurantoin  Macrocrystal] Other (See Comments)    Numbness, aching all over   Other Shortness Of Breath, Rash and Other (See Comments)    Pine nuts & walnuts throat congestion. No documented angioedema.    Propoxyphene Hives, Swelling, Rash and Other (See Comments)   Propoxyphene Hcl Hives, Swelling and Rash   Augmentin  [Amoxicillin -Pot Clavulanate] Itching   Dulaglutide Other (See Comments)    Other reaction(s): weak, ha, no appetite   Latex Rash and Other (See Comments)   Metformin  Hcl Other (See Comments)    Other reaction(s): leg discomfort   Neosporin [Bacitracin-Polymyxin B] Other (See Comments)    Blisters   Zoster Vac Recomb Adjuvanted Rash and Other (See Comments)    Other reaction(s): confusion   Avocado Rash   Banana Rash    Pine nuts   Ciprofloxacin  Nausea Only and Other (See Comments)    Nausea (Note: Patient takes this when on mission trips, however)   Tizanidine Other (See Comments)    Reaction not recalled   Tramadol  Nausea Only    Consultations: None  Procedures/Studies: CT Angio Chest PE W and/or Wo Contrast Result Date: 03/15/2024 CLINICAL DATA:  Cough, pneumonia, short of breath EXAM: CT ANGIOGRAPHY CHEST WITH CONTRAST TECHNIQUE: Multidetector CT imaging of the chest was performed using the standard protocol during bolus administration of intravenous contrast. Multiplanar CT image reconstructions and MIPs were obtained to evaluate the vascular anatomy. RADIATION DOSE REDUCTION: This exam was performed according to the departmental dose-optimization program which includes automated exposure control, adjustment of the mA and/or kV according to patient size and/or use of iterative reconstruction technique. CONTRAST:  75mL OMNIPAQUE   IOHEXOL  350 MG/ML SOLN COMPARISON:  05/25/2021 FINDINGS: Cardiovascular: This is a technically adequate evaluation of the pulmonary vasculature. No filling defects or pulmonary emboli. No evidence of cardiomegaly or pericardial effusion. Calcification of the mitral annulus. Normal caliber of the thoracic aorta. Atherosclerosis of the aortic arch. Mediastinum/Nodes: Mediastinal lymphadenopathy, with index lymph node in the subcarinal region measuring 18 mm in short axis and within the AP window measuring 15 mm in short axis. Borderline enlarged bilateral hilar lymph nodes also noted. Thyroid , trachea, and esophagus are unremarkable. Lungs/Pleura: There is patchy ground-glass airspace disease within the right upper lobe and bilateral lower lobes. Bilateral bronchial  wall thickening greatest at the lung bases. No effusion or pneumothorax. Upper Abdomen: No acute abnormality. Musculoskeletal: No acute or destructive bony abnormalities. Reconstructed images demonstrate no additional findings. Review of the MIP images confirms the above findings. IMPRESSION: 1. No evidence of pulmonary embolus. 2. Patchy bilateral ground-glass airspace disease and bilateral bronchial wall thickening, consistent with multifocal pneumonia. 3. Mediastinal and hilar adenopathy likely reactive. 4.  Aortic Atherosclerosis (ICD10-I70.0). Electronically Signed   By: Bobbye Burrow M.D.   On: 03/15/2024 13:10   CT Head Wo Contrast Result Date: 03/15/2024 CLINICAL DATA:  Headache, right ear ache EXAM: CT HEAD WITHOUT CONTRAST TECHNIQUE: Contiguous axial images were obtained from the base of the skull through the vertex without intravenous contrast. RADIATION DOSE REDUCTION: This exam was performed according to the departmental dose-optimization program which includes automated exposure control, adjustment of the mA and/or kV according to patient size and/or use of iterative reconstruction technique. COMPARISON:  05/13/2022 FINDINGS: Brain: No  acute infarct or hemorrhage. Lateral ventricles and midline structures are unremarkable. No acute extra-axial fluid collections. 1.2 x 1.2 x 1.0 cm calcified lesion along the inferior aspect of the right tentorium compatible with calcified meningioma, unchanged since prior study. No mass effect. Vascular: No hyperdense vessel or unexpected calcification. Skull: Normal. Negative for fracture or focal lesion. Sinuses/Orbits: Mucoperiosteal thickening throughout the frontal, ethmoid, and maxillary sinuses, with small gas fluid levels in the right frontal sinus and bilateral maxillary sinuses. Other: None. IMPRESSION: 1. Acute on chronic sinusitis. 2. No acute intracranial process. 3. Stable calcified right tentorial meningioma.  No mass effect. Electronically Signed   By: Bobbye Burrow M.D.   On: 03/15/2024 13:06   DG Chest 2 View Result Date: 03/15/2024 CLINICAL DATA:  Cough. EXAM: CHEST - 2 VIEW COMPARISON:  Mar 13, 2024. FINDINGS: The heart size and mediastinal contours are within normal limits. Both lungs are clear. The visualized skeletal structures are unremarkable. IMPRESSION: No active cardiopulmonary disease. Electronically Signed   By: Rosalene Colon M.D.   On: 03/15/2024 11:20   DG Chest 2 View Result Date: 03/13/2024 CLINICAL DATA:  respiratory crackles at left lung base EXAM: CHEST - 2 VIEW COMPARISON:  04/17/2023. FINDINGS: The heart size and mediastinal contours are within normal limits. No focal consolidation, pleural effusion, or pneumothorax. Diffuse osseous demineralization. No acute osseous abnormality. IMPRESSION: No acute cardiopulmonary findings. Electronically Signed   By: Mannie Seek M.D.   On: 03/13/2024 15:58   CT ABDOMEN PELVIS W CONTRAST Result Date: 02/27/2024 EXAMINATION: CT ABDOMEN PELVIS W CONTRAST CLINICAL INDICATION: Female, 78 years old. Left lower quadrant abdominal pain TECHNIQUE: Axial CT of the abdomen and pelvis with 100 cc Isovue  300 intravenous contrast.  Multiplanar reformations provided. Unless otherwise specified, incidental thyroid , adrenal, renal lesions do not require dedicated imaging follow up. Additionally, any mentioned pulmonary nodules do not require dedicated imaging follow-up based on the Fleischner guidelines unless otherwise specified. Coronary calcifications are not identified unless otherwise specified. COMPARISON: 05/25/2021 FINDINGS: The lung bases are clear. The heart is normal in size. The liver contains a few small cysts. The gallbladder is normal. The spleen is normal. The pancreas is normal. The adrenals are normal. The kidneys are normal. Abdominal aorta is normal in caliber. Scattered atherosclerotic changes are present. The bladder is normal. The uterus is surgically absent. The appendix is normal. Large and small bowel loops are otherwise within normal limits. There is no free fluid or pathologic lymphadenopathy by size criteria. There is diffuse osseous demineralization with degenerative  changes of the spine and bony pelvis. IMPRESSION: No acute findings in the abdomen or pelvis. DOSE REDUCTION: This exam was performed according to our departmental dose-optimization program which includes automated exposure control, adjustment of the mA and/or kV according to patient size and/or use of iterative reconstruction technique. Electronically signed by: [place] Engel MD 02/27/2024 05:21 PM EDT RP Workstation: [id number]     Subjective: No acute issues or events overnight   Discharge Exam: Vitals:   03/16/24 0829 03/16/24 0950  BP:  125/69  Pulse:  93  Resp:    Temp:    SpO2: 99%    Vitals:   03/16/24 0127 03/16/24 0403 03/16/24 0829 03/16/24 0950  BP: 136/81 (!) 159/79  125/69  Pulse: 80 80  93  Resp: 18 18    Temp: 98.4 F (36.9 C) 97.9 F (36.6 C)    TempSrc:      SpO2: 97% 97% 99%   Weight:      Height:        General: Pt is alert, awake, not in acute distress Cardiovascular: RRR, S1/S2 +, no rubs, no  gallops Respiratory: CTA bilaterally, no wheezing, no rhonchi Abdominal: Soft, NT, ND, bowel sounds + Extremities: no edema, no cyanosis    The results of significant diagnostics from this hospitalization (including imaging, microbiology, ancillary and laboratory) are listed below for reference.     Microbiology: Recent Results (from the past 240 hours)  Resp panel by RT-PCR (RSV, Flu A&B, Covid) Anterior Nasal Swab     Status: None   Collection Time: 03/15/24 10:35 AM   Specimen: Anterior Nasal Swab  Result Value Ref Range Status   SARS Coronavirus 2 by RT PCR NEGATIVE NEGATIVE Final    Comment: (NOTE) SARS-CoV-2 target nucleic acids are NOT DETECTED.  The SARS-CoV-2 RNA is generally detectable in upper respiratory specimens during the acute phase of infection. The lowest concentration of SARS-CoV-2 viral copies this assay can detect is 138 copies/mL. A negative result does not preclude SARS-Cov-2 infection and should not be used as the sole basis for treatment or other patient management decisions. A negative result may occur with  improper specimen collection/handling, submission of specimen other than nasopharyngeal swab, presence of viral mutation(s) within the areas targeted by this assay, and inadequate number of viral copies(<138 copies/mL). A negative result must be combined with clinical observations, patient history, and epidemiological information. The expected result is Negative.  Fact Sheet for Patients:  BloggerCourse.com  Fact Sheet for Healthcare Providers:  SeriousBroker.it  This test is no t yet approved or cleared by the [place]  FDA and  has been authorized for detection and/or diagnosis of SARS-CoV-2 by FDA under an Emergency Use Authorization (EUA). This EUA will remain  in effect (meaning this test can be used) for the duration of the COVID-19 declaration under Section 564(b)(1) of the Act,  21 U.S.C.section 360bbb-3(b)(1), unless the authorization is terminated  or revoked sooner.       Influenza A by PCR NEGATIVE NEGATIVE Final   Influenza B by PCR NEGATIVE NEGATIVE Final    Comment: (NOTE) The Xpert Xpress SARS-CoV-2/FLU/RSV plus assay is intended as an aid in the diagnosis of influenza from Nasopharyngeal swab specimens and should not be used as a sole basis for treatment. Nasal washings and aspirates are unacceptable for Xpert Xpress SARS-CoV-2/FLU/RSV testing.  Fact Sheet for Patients: BloggerCourse.com  Fact Sheet for Healthcare Providers: SeriousBroker.it  This test is not yet approved or cleared by the [place]  FDA and  has been authorized for detection and/or diagnosis of SARS-CoV-2 by FDA under an Emergency Use Authorization (EUA). This EUA will remain in effect (meaning this test can be used) for the duration of the COVID-19 declaration under Section 564(b)(1) of the Act, 21 U.S.C. section 360bbb-3(b)(1), unless the authorization is terminated or revoked.     Resp Syncytial Virus by PCR NEGATIVE NEGATIVE Final    Comment: (NOTE) Fact Sheet for Patients: BloggerCourse.com  Fact Sheet for Healthcare Providers: SeriousBroker.it  This test is not yet approved or cleared by the [place]  FDA and has been authorized for detection and/or diagnosis of SARS-CoV-2 by FDA under an Emergency Use Authorization (EUA). This EUA will remain in effect (meaning this test can be used) for the duration of the COVID-19 declaration under Section 564(b)(1) of the Act, 21 U.S.C. section 360bbb-3(b)(1), unless the authorization is terminated or revoked.  Performed at [facility], [address] [place], [place] [address]   Blood culture (routine x 2)     Status: None (Preliminary result)   Collection Time: 03/15/24 11:11 AM   Specimen: BLOOD   Result Value Ref Range Status   Specimen Description   Final    BLOOD RIGHT ANTECUBITAL Performed at [facility], [address] [place], [place] [address]    Special Requests   Final    BOTTLES DRAWN AEROBIC AND ANAEROBIC Blood Culture adequate volume Performed at [facility], [address] [place], [place] [address]    Culture   Final    NO GROWTH < 24 HOURS Performed at [facility], [address] [place], [place] [address]    Report Status PENDING  Incomplete  Blood culture (routine x 2)     Status: None (Preliminary result)   Collection Time: 03/15/24 11:45 AM   Specimen: BLOOD  Result Value Ref Range Status   Specimen Description   Final    BLOOD LEFT ANTECUBITAL Performed at [facility], [address] [place], [place] [address]    Special Requests   Final    BOTTLES DRAWN AEROBIC AND ANAEROBIC Blood Culture adequate volume Performed at [facility], [address] [place], [place] [address]    Culture   Final    NO GROWTH < 24 HOURS Performed at [facility], [address] [place], [place] [address]    Report Status PENDING  Incomplete     Labs: BNP (last 3 results) No results for input(s): "BNP" in the last 8760 hours. Basic Metabolic Panel: Recent Labs  Lab 03/15/24 1056 03/16/24 0743  NA 136 133*  K 3.7 4.5  CL 97* 102  CO2 26 22  GLUCOSE 319* 353*  BUN 13 20  CREATININE 0.62 0.60  CALCIUM 10.1 9.3   Liver Function Tests: Recent Labs  Lab 03/15/24 1056 03/16/24 0743  AST 44* 39  ALT 37 43  ALKPHOS 92 78  BILITOT 0.2 0.6  PROT 7.8 7.6  ALBUMIN 3.4* 2.8*   No results for input(s): "LIPASE", "AMYLASE" in the last 168 hours. No results for input(s): "AMMONIA" in the last 168 hours. CBC: Recent Labs  Lab 03/15/24 1056 03/16/24 0743  WBC 2.6* 1.8*  NEUTROABS 0.7*  --   HGB 9.3* 9.6*  HCT 28.7* 32.0*  MCV 86.7 93.6  PLT 180 182   Cardiac Enzymes: No results for  input(s): "CKTOTAL", "CKMB", "CKMBINDEX", "TROPONINI" in the last 168 hours. BNP: Invalid  input(s): "POCBNP" CBG: Recent Labs  Lab 03/15/24 1733 03/15/24 2026 03/16/24 0735  GLUCAP 198* 218* 361*   D-Dimer No results for input(s): "DDIMER" in the last 72 hours. Hgb A1c No results for input(s): "HGBA1C" in the last 72 hours. Lipid Profile No results for input(s): "CHOL", "HDL", "LDLCALC", "TRIG", "CHOLHDL", "LDLDIRECT" in the last 72 hours. Thyroid  function studies No results for input(s): "TSH", "T4TOTAL", "T3FREE", "THYROIDAB" in the last 72 hours.  Invalid input(s): "FREET3" Anemia work up No results for input(s): "VITAMINB12", "FOLATE", "FERRITIN", "TIBC", "IRON ", "RETICCTPCT" in the last 72 hours. Urinalysis    Component Value Date/Time   COLORURINE YELLOW 04/13/2020 1627   APPEARANCEUR CLEAR 04/13/2020 1627   LABSPEC 1.020 04/13/2020 1627   PHURINE 5.5 04/13/2020 1627   GLUCOSEU NEGATIVE 04/13/2020 1627   HGBUR NEGATIVE 04/13/2020 1627   HGBUR negative 09/19/2010 1125   BILIRUBINUR NEGATIVE 07/21/2018 1925   BILIRUBINUR Neg 12/06/2012 1455   KETONESUR TRACE (A) 04/13/2020 1627   PROTEINUR NEGATIVE 04/13/2020 1627   UROBILINOGEN 0.2 12/06/2012 1455   UROBILINOGEN 0.2 10/16/2012 1718   NITRITE NEGATIVE 07/21/2018 1925   LEUKOCYTESUR SMALL (A) 07/21/2018 1925   Sepsis Labs Recent Labs  Lab 03/15/24 1056 03/16/24 0743  WBC 2.6* 1.8*   Microbiology Recent Results (from the past 240 hours)  Resp panel by RT-PCR (RSV, Flu A&B, Covid) Anterior Nasal Swab     Status: None   Collection Time: 03/15/24 10:35 AM   Specimen: Anterior Nasal Swab  Result Value Ref Range Status   SARS Coronavirus 2 by RT PCR NEGATIVE NEGATIVE Final    Comment: (NOTE) SARS-CoV-2 target nucleic acids are NOT DETECTED.  The SARS-CoV-2 RNA is generally detectable in upper respiratory specimens during the acute phase of infection. The lowest concentration of SARS-CoV-2 viral copies this  assay can detect is 138 copies/mL. A negative result does not preclude SARS-Cov-2 infection and should not be used as the sole basis for treatment or other patient management decisions. A negative result may occur with  improper specimen collection/handling, submission of specimen other than nasopharyngeal swab, presence of viral mutation(s) within the areas targeted by this assay, and inadequate number of viral copies(<138 copies/mL). A negative result must be combined with clinical observations, patient history, and epidemiological information. The expected result is Negative.  Fact Sheet for Patients:  BloggerCourse.com  Fact Sheet for Healthcare Providers:  SeriousBroker.it  This test is no t yet approved or cleared by the [place]  FDA and  has been authorized for detection and/or diagnosis of SARS-CoV-2 by FDA under an Emergency Use Authorization (EUA). This EUA will remain  in effect (meaning this test can be used) for the duration of the COVID-19 declaration under Section 564(b)(1) of the Act, 21 U.S.C.section 360bbb-3(b)(1), unless the authorization is terminated  or revoked sooner.       Influenza A by PCR NEGATIVE NEGATIVE Final   Influenza B by PCR NEGATIVE NEGATIVE Final    Comment: (NOTE) The Xpert Xpress SARS-CoV-2/FLU/RSV plus assay is intended as an aid in the diagnosis of influenza from Nasopharyngeal swab specimens and should not be used as a sole basis for treatment. Nasal washings and aspirates are unacceptable for Xpert Xpress SARS-CoV-2/FLU/RSV testing.  Fact Sheet for Patients: BloggerCourse.com  Fact Sheet for Healthcare Providers: SeriousBroker.it  This test is not yet approved or cleared by the [place]  FDA and has been authorized for detection and/or diagnosis of SARS-CoV-2 by FDA under an Emergency Use Authorization (EUA). This EUA will  remain in  effect (meaning this test can be used) for the duration of the COVID-19 declaration under Section 564(b)(1) of the Act, 21 U.S.C. section 360bbb-3(b)(1), unless the authorization is terminated or revoked.     Resp Syncytial Virus by PCR NEGATIVE NEGATIVE Final    Comment: (NOTE) Fact Sheet for Patients: BloggerCourse.com  Fact Sheet for Healthcare Providers: SeriousBroker.it  This test is not yet approved or cleared by the [place]  FDA and has been authorized for detection and/or diagnosis of SARS-CoV-2 by FDA under an Emergency Use Authorization (EUA). This EUA will remain in effect (meaning this test can be used) for the duration of the COVID-19 declaration under Section 564(b)(1) of the Act, 21 U.S.C. section 360bbb-3(b)(1), unless the authorization is terminated or revoked.  Performed at [facility], [address] [place], [place] [address]   Blood culture (routine x 2)     Status: None (Preliminary result)   Collection Time: 03/15/24 11:11 AM   Specimen: BLOOD  Result Value Ref Range Status   Specimen Description   Final    BLOOD RIGHT ANTECUBITAL Performed at [facility], [address] [place], [place] [address]    Special Requests   Final    BOTTLES DRAWN AEROBIC AND ANAEROBIC Blood Culture adequate volume Performed at [facility], [address] [place], [place] [address]    Culture   Final    NO GROWTH < 24 HOURS Performed at [facility], [address] [place], [place] [address]    Report Status PENDING  Incomplete  Blood culture (routine x 2)     Status: None (Preliminary result)   Collection Time: 03/15/24 11:45 AM   Specimen: BLOOD  Result Value Ref Range Status   Specimen Description   Final    BLOOD LEFT ANTECUBITAL Performed at [facility], [address] [place], [place] [address]    Special Requests   Final    BOTTLES DRAWN  AEROBIC AND ANAEROBIC Blood Culture adequate volume Performed at [facility], [address] [place], [place] [address]    Culture   Final    NO GROWTH < 24 HOURS Performed at [facility], [address] [place], [place] [address]    Report Status PENDING  Incomplete     Time coordinating discharge: Over 30 minutes  SIGNED:   Haydee Lipa, DO [facility] 03/16/2024, 11:53 AM Pager   If 7PM-7AM, please contact night-coverage [email or web address]

## 2024-03-17 ENCOUNTER — Ambulatory Visit

## 2024-03-17 ENCOUNTER — Encounter: Payer: Self-pay | Admitting: Podiatry

## 2024-03-17 ENCOUNTER — Inpatient Hospital Stay

## 2024-03-17 ENCOUNTER — Ambulatory Visit: Admitting: Podiatry

## 2024-03-17 ENCOUNTER — Ambulatory Visit: Admitting: Family

## 2024-03-17 DIAGNOSIS — M79675 Pain in left toe(s): Secondary | ICD-10-CM

## 2024-03-17 DIAGNOSIS — B351 Tinea unguium: Secondary | ICD-10-CM | POA: Diagnosis not present

## 2024-03-17 DIAGNOSIS — M79674 Pain in right toe(s): Secondary | ICD-10-CM

## 2024-03-17 NOTE — Progress Notes (Signed)
 Chief Complaint  Patient presents with   [facility].     DFC, NIDDM A1C ?. Nail trim.    SUBJECTIVE Patient presents to office today complaining of elongated, thickened nails that cause pain while ambulating in shoes.  Patient is unable to trim their own nails. Patient is here for further evaluation and treatment.  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   Nephrolithiasis    Dr Luster Salters, [facility], Hx of [3][   Polyp of colon    Radiculopathy     Allergies  Allergen Reactions   Codeine Hives and Other (See Comments)    Because of a history of documented adverse serious drug reaction, Medi Alert bracelet  is recommended   Macrodantin  [Nitrofurantoin  Macrocrystal] Other (See Comments)    Numbness, aching all over   Other Shortness Of Breath, Rash and Other (See Comments)    Pine nuts & walnuts throat congestion. No documented angioedema.    Propoxyphene Hives, Swelling, Rash and Other (See Comments)   Propoxyphene Hcl Hives, Swelling and Rash   Augmentin  [Amoxicillin -Pot Clavulanate] Itching   Dulaglutide Other (See Comments)    Other  reaction(s): weak, ha, no appetite   Latex Rash and Other (See Comments)   Metformin  Hcl Other (See Comments)    Other reaction(s): leg discomfort   Neosporin [Bacitracin-Polymyxin B] Other (See Comments)    Blisters   Zoster Vac Recomb Adjuvanted Rash and Other (See Comments)    Other reaction(s): confusion   Avocado Rash   Banana Rash    Pine nuts   Ciprofloxacin  Nausea Only and Other (See Comments)    Nausea (Note: Patient takes this when on mission trips, however)   Tizanidine Other (See Comments)    Reaction not recalled   Tramadol  Nausea Only     OBJECTIVE General Patient is awake, alert, and oriented x 3 and in no acute distress. Derm Skin is dry and supple bilateral. Negative open lesions or macerations. Remaining integument unremarkable. Nails are tender, long, thickened and dystrophic with subungual debris, consistent with onychomycosis, 1-5 bilateral. No signs of infection noted. Vasc  DP and PT pedal pulses palpable bilaterally. Temperature gradient within normal limits.  Neuro Epicritic and protective threshold sensation grossly intact bilaterally.  Musculoskeletal Exam No symptomatic pedal deformities noted bilateral. Muscular strength within normal limits.  ASSESSMENT 1.  Pain due to onychomycosis of toenails both  PLAN OF CARE 1. Patient evaluated today.  2. Instructed to maintain good pedal hygiene and foot care.  3. Mechanical debridement of nails 1-5 bilaterally  performed using a nail nipper. Filed with dremel without incident.  4. Return to clinic in 3 mos.    Dot Gazella, DPM [facility]  Dr. Dot Gazella, DPM    [address] [place], [place] [address]                Office [phone]  Fax [phone]

## 2024-03-20 LAB — CULTURE, BLOOD (ROUTINE X 2)
Culture: NO GROWTH
Culture: NO GROWTH
Special Requests: ADEQUATE
Special Requests: ADEQUATE

## 2024-03-21 ENCOUNTER — Inpatient Hospital Stay: Admitting: Family

## 2024-03-21 ENCOUNTER — Inpatient Hospital Stay

## 2024-03-31 ENCOUNTER — Inpatient Hospital Stay

## 2024-03-31 ENCOUNTER — Telehealth: Payer: Self-pay | Admitting: *Deleted

## 2024-03-31 ENCOUNTER — Inpatient Hospital Stay (HOSPITAL_BASED_OUTPATIENT_CLINIC_OR_DEPARTMENT_OTHER): Admitting: Family

## 2024-03-31 ENCOUNTER — Other Ambulatory Visit: Payer: Self-pay

## 2024-03-31 ENCOUNTER — Encounter: Payer: Self-pay | Admitting: Family

## 2024-03-31 ENCOUNTER — Inpatient Hospital Stay: Attending: Hematology & Oncology

## 2024-03-31 VITALS — BP 128/48 | HR 69 | Temp 98.4°F | Resp 17 | Ht 65.0 in | Wt 165.8 lb

## 2024-03-31 DIAGNOSIS — D46Z Other myelodysplastic syndromes: Secondary | ICD-10-CM

## 2024-03-31 DIAGNOSIS — D4620 Refractory anemia with excess of blasts, unspecified: Secondary | ICD-10-CM | POA: Insufficient documentation

## 2024-03-31 DIAGNOSIS — D500 Iron deficiency anemia secondary to blood loss (chronic): Secondary | ICD-10-CM

## 2024-03-31 DIAGNOSIS — D709 Neutropenia, unspecified: Secondary | ICD-10-CM

## 2024-03-31 DIAGNOSIS — D72819 Decreased white blood cell count, unspecified: Secondary | ICD-10-CM

## 2024-03-31 LAB — CBC WITH DIFFERENTIAL (CANCER CENTER ONLY)
Abs Immature Granulocytes: 0.02 10*3/uL (ref 0.00–0.07)
Basophils Absolute: 0 10*3/uL (ref 0.0–0.1)
Basophils Relative: 0 %
Eosinophils Absolute: 0 10*3/uL (ref 0.0–0.5)
Eosinophils Relative: 1 %
HCT: 30.1 % — ABNORMAL LOW (ref 36.0–46.0)
Hemoglobin: 9.6 g/dL — ABNORMAL LOW (ref 12.0–15.0)
Immature Granulocytes: 1 %
Lymphocytes Relative: 71 %
Lymphs Abs: 1.4 10*3/uL (ref 0.7–4.0)
MCH: 27.6 pg (ref 26.0–34.0)
MCHC: 31.9 g/dL (ref 30.0–36.0)
MCV: 86.5 fL (ref 80.0–100.0)
Monocytes Absolute: 0.3 10*3/uL (ref 0.1–1.0)
Monocytes Relative: 13 %
Neutro Abs: 0.3 10*3/uL — CL (ref 1.7–7.7)
Neutrophils Relative %: 14 %
Platelet Count: 207 10*3/uL (ref 150–400)
RBC: 3.48 MIL/uL — ABNORMAL LOW (ref 3.87–5.11)
RDW: 16.1 % — ABNORMAL HIGH (ref 11.5–15.5)
Smear Review: NORMAL
WBC Count: 1.9 10*3/uL — ABNORMAL LOW (ref 4.0–10.5)
nRBC: 0 % (ref 0.0–0.2)

## 2024-03-31 LAB — CMP (CANCER CENTER ONLY)
ALT: 17 U/L (ref 0–44)
AST: 15 U/L (ref 15–41)
Albumin: 3.8 g/dL (ref 3.5–5.0)
Alkaline Phosphatase: 73 U/L (ref 38–126)
Anion gap: 8 (ref 5–15)
BUN: 13 mg/dL (ref 8–23)
CO2: 27 mmol/L (ref 22–32)
Calcium: 9.8 mg/dL (ref 8.9–10.3)
Chloride: 100 mmol/L (ref 98–111)
Creatinine: 0.72 mg/dL (ref 0.44–1.00)
GFR, Estimated: >60 mL/min (ref >60)
Glucose, Bld: 305 mg/dL — ABNORMAL HIGH (ref 70–99)
Potassium: 3.9 mmol/L (ref 3.5–5.1)
Sodium: 135 mmol/L (ref 135–145)
Total Bilirubin: 0.3 mg/dL (ref 0.0–1.2)
Total Protein: 7.9 g/dL (ref 6.5–8.1)

## 2024-03-31 LAB — RETICULOCYTES
Immature Retic Fract: 21.2 % — ABNORMAL HIGH (ref 2.3–15.9)
RBC.: 3.58 MIL/uL — ABNORMAL LOW (ref 3.87–5.11)
Retic Count, Absolute: 48 10*3/uL (ref 19.0–186.0)
Retic Ct Pct: 1.3 % (ref 0.4–3.1)

## 2024-03-31 LAB — FERRITIN: Ferritin: 1492 ng/mL — ABNORMAL HIGH (ref 11–307)

## 2024-03-31 MED ORDER — FILGRASTIM-SNDZ 480 MCG/0.8ML IJ SOSY
480.0000 ug | PREFILLED_SYRINGE | Freq: Once | INTRAMUSCULAR | Status: AC
  Administered 2024-03-31: 480 ug via SUBCUTANEOUS
  Filled 2024-03-31: qty 0.8

## 2024-03-31 MED ORDER — DARBEPOETIN ALFA 300 MCG/0.6ML IJ SOSY
300.0000 ug | PREFILLED_SYRINGE | Freq: Once | INTRAMUSCULAR | Status: AC
  Administered 2024-03-31: 300 ug via SUBCUTANEOUS
  Filled 2024-03-31: qty 0.6

## 2024-03-31 MED ORDER — SODIUM CHLORIDE 0.9% FLUSH
10.0000 mL | Freq: Once | INTRAVENOUS | Status: DC | PRN
Start: 2024-03-31 — End: 2024-03-31

## 2024-03-31 MED ORDER — SODIUM CHLORIDE 0.9% FLUSH
3.0000 mL | Freq: Once | INTRAVENOUS | Status: DC | PRN
Start: 2024-03-31 — End: 2024-03-31

## 2024-03-31 MED ORDER — HEPARIN SOD (PORK) LOCK FLUSH 100 UNIT/ML IV SOLN
500.0000 [IU] | Freq: Once | INTRAVENOUS | Status: DC | PRN
Start: 2024-03-31 — End: 2024-03-31

## 2024-03-31 MED ORDER — HEPARIN SOD (PORK) LOCK FLUSH 100 UNIT/ML IV SOLN
250.0000 [IU] | Freq: Once | INTRAVENOUS | Status: DC | PRN
Start: 2024-03-31 — End: 2024-03-31

## 2024-03-31 MED ORDER — ALTEPLASE 2 MG IJ SOLR
2.0000 mg | Freq: Once | INTRAMUSCULAR | Status: DC | PRN
Start: 2024-03-31 — End: 2024-03-31

## 2024-03-31 NOTE — Progress Notes (Signed)
 Hematology and Oncology Follow Up Visit  Kristen Murray [id number] 09-14-1946 78 y.o. 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 She had a bout with pneumonia with an overnight stay in the hospital 3 weeks ago. She is still feeling quite fatigued and has a dry cough.  WBC count 1.9, ANC 0.3 and Hgb 9.6.  No fever, chills, n/v, cough, rash, dizziness, SOB, chest pain, palpitations, abdominal pain or changes in bowel or bladder habits.  No swelling, tenderness, numbness or tingling in her extremities.  No falls or syncope reported.  Appetite is fair but she is staying well hydrated with water and electrolytes. Weight is stable at 165 lbs.   ECOG Performance Status: 1 - Symptomatic but completely ambulatory  Medications:  Allergies as of 03/31/2024       Reactions   Codeine Hives, Other (See Comments)   Because of a history of documented adverse serious drug reaction, Medi Alert bracelet  is recommended   Macrodantin  [nitrofurantoin  Macrocrystal] Other (See Comments)   Numbness, aching all over   Other Shortness Of Breath, Rash, Other (See Comments)   Pine nuts & walnuts throat congestion. No documented angioedema.   Propoxyphene Hives, Swelling, Rash, Other (See Comments)   Propoxyphene Hcl Hives, Swelling, Rash   Augmentin  [amoxicillin -pot Clavulanate] Itching   Dulaglutide Other (See Comments)   Other reaction(s): weak, ha, no appetite   Latex Rash, Other (See Comments)   Metformin  Hcl Other (See Comments)   Other reaction(s): leg discomfort   Neosporin [bacitracin-polymyxin B] Other (See Comments)   Blisters   Zoster Vac Recomb Adjuvanted Rash, Other (See Comments)   Other  reaction(s): confusion   Avocado Rash   Banana Rash   Pine nuts   Ciprofloxacin  Nausea Only, Other (See Comments)   Nausea (Note: Patient takes this when on mission trips, however)   Tizanidine Other (See Comments)   Reaction not recalled   Tramadol  Nausea Only        Medication List        Accurate as of March 31, 2024  1:57 PM. If you have any questions, ask your nurse or doctor.          STOP taking these medications    azithromycin  250 MG tablet Commonly known as: ZITHROMAX  Stopped by: Kennard Pea   chlorpheniramine-HYDROcodone  10-8 MG/5ML Commonly known as: TUSSIONEX Stopped by: Kennard Pea   diclofenac 0.1 % ophthalmic solution Commonly known as: VOLTAREN Stopped by: Kennard Pea       TAKE these medications    ASCORBIC ACID PO Take 1 tablet by mouth daily. Dosage unknown   azelastine 0.1 % nasal spray Commonly known as: ASTELIN Place into both nostrils 2 (two) times daily. Use in each nostril as directed   b complex vitamins capsule Take 1 capsule by mouth daily.   cetirizine  10 MG tablet Commonly known as: ZYRTEC  Take 1 tablet (10 mg total) by mouth daily as needed for allergies (Can take an extra dose during flare ups.).   Cholecalciferol 25 MCG (1000 UT) capsule Take 1,000 Units by mouth daily.   cycloSPORINE 0.05 % ophthalmic emulsion Commonly known as: RESTASIS Place  1 drop into both eyes every Monday, Wednesday, and Friday.   dextromethorphan -guaiFENesin  30-600 MG 12hr tablet Commonly known as: MUCINEX  DM Take 1 tablet by mouth 2 (two) times daily.   DropSafe Alcohol Prep 70 % Pads Apply topically.   Eliquis  5 MG Tabs tablet Generic drug: apixaban  TAKE 1 TABLET BY MOUTH TWICE  DAILY   EPINEPHrine  0.3 mg/0.3 mL Soaj injection Commonly known as: EpiPen  2-Pak Inject 0.3 mg into the muscle as needed for anaphylaxis.   estradiol 0.1 MG/GM vaginal cream Commonly known as: ESTRACE Place 1 Applicatorful vaginally once a week. 1-2  weekly   fluorometholone 0.1 % ophthalmic suspension Commonly known as: FML Place 1 drop into both eyes every Monday, Wednesday, and Friday.   fluticasone  50 MCG/ACT nasal spray Commonly known as: FLONASE  Place 1 spray into both nostrils 2 (two) times daily.   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   glipiZIDE  10 MG 24 hr tablet Commonly known as: GLUCOTROL  XL Take 20 mg by mouth daily.   glucose blood test strip Inject 1 strip into the skin daily.   glucose blood test strip Commonly known as: FREESTYLE LITE CHECK BLOOD SUGAR ONCE DAILY AS DIRECTED.  DX:790.29   Lantus  SoloStar 100 UNIT/ML Solostar Pen Generic drug: insulin  glargine Inject 10 Units into the skin daily.   levothyroxine  25 MCG tablet Commonly known as: SYNTHROID  Take 25 mcg by mouth daily.   lidocaine  5 % ointment Commonly known as: XYLOCAINE  Apply 1 application topically 3 (three) times daily as needed.   metoprolol  tartrate 50 MG tablet Commonly known as: LOPRESSOR  Take 1.5 tablets (75 mg total) by mouth 2 (two) times daily. Pt needs to schedule appt with provider by April, 2025 to avoid any missed doses   montelukast  10 MG tablet Commonly known as: SINGULAIR  Take 1 tablet (10 mg total) by mouth at bedtime.   multivitamin tablet Take 1 tablet by mouth daily. Shaklee brand   nitroGLYCERIN  0.4 MG SL tablet Commonly known as: NITROSTAT  Place 1 tablet (0.4 mg total) under the tongue every 5 (five) minutes as needed for chest pain.   pantoprazole  40 MG tablet Commonly known as: PROTONIX  Take 1 tablet (40 mg total) by mouth 2 (two) times daily.   rosuvastatin  5 MG tablet Commonly known as: CRESTOR  Take 1 tablet (5 mg total) by mouth daily.   trolamine salicylate 10 % cream Commonly known as: ASPERCREME Apply 1 application topically as needed for muscle pain.    valACYclovir  500 MG tablet Commonly known as: VALTREX  Take 1 tablet (500 mg total) by mouth 2 (two) times daily. Take twice a day for thee days as needed for an outbreak.   verapamil  180 MG CR tablet Commonly known as: CALAN -SR Take 180 mg by mouth at bedtime.        Allergies:  Allergies  Allergen Reactions   Codeine Hives and Other (See Comments)    Because of a history of documented adverse serious drug reaction, Medi Alert bracelet  is recommended   Macrodantin  [Nitrofurantoin  Macrocrystal] Other (See Comments)    Numbness, aching all over   Other Shortness Of Breath, Rash and Other (See Comments)    Pine nuts & walnuts throat congestion. No documented angioedema.    Propoxyphene Hives, Swelling, Rash and Other (See Comments)   Propoxyphene Hcl Hives, Swelling and Rash   Augmentin  [Amoxicillin -Pot Clavulanate] Itching  Dulaglutide Other (See Comments)    Other reaction(s): weak, ha, no appetite   Latex Rash and Other (See Comments)   Metformin  Hcl Other (See Comments)    Other reaction(s): leg discomfort   Neosporin [Bacitracin-Polymyxin B] Other (See Comments)    Blisters   Zoster Vac Recomb Adjuvanted Rash and Other (See Comments)    Other reaction(s): confusion   Avocado Rash   Banana Rash    Pine nuts   Ciprofloxacin  Nausea Only and Other (See Comments)    Nausea (Note: Patient takes this when on mission trips, however)   Tizanidine Other (See Comments)    Reaction not recalled   Tramadol  Nausea Only    Past Medical History, Surgical history, Social history, and Family History were reviewed and updated.  Review of Systems: All other 10 point review of systems is negative.   Physical Exam:  vitals were not taken for this visit.   Wt Readings from Last 3 Encounters:  03/15/24 167 lb (75.8 kg)  02/15/24 170 lb 12.8 oz (77.5 kg)  02/12/24 170 lb (77.1 kg)    Ocular: Sclerae unicteric, pupils equal, round and reactive to light Ear-nose-throat:  Oropharynx clear, dentition fair Lymphatic: No cervical or supraclavicular adenopathy Lungs no rales or rhonchi, good excursion bilaterally Heart regular rate and rhythm, no murmur appreciated Abd soft, nontender, positive bowel sounds MSK no focal spinal tenderness, no joint edema Neuro: non-focal, well-oriented, appropriate affect Breasts: Deferred   Lab Results  Component Value Date   WBC 1.9 (L) 03/31/2024   HGB 9.6 (L) 03/31/2024   HCT 30.1 (L) 03/31/2024   MCV 86.5 03/31/2024   PLT 207 03/31/2024   Lab Results  Component Value Date   FERRITIN 992 (H) 02/15/2024   IRON  158 02/15/2024   TIBC 235 (L) 02/15/2024   UIBC 77 (L) 02/15/2024   IRONPCTSAT 67 (H) 02/15/2024   Lab Results  Component Value Date   RETICCTPCT 1.3 03/31/2024   RBC 3.58 (L) 03/31/2024   RBC 3.48 (L) 03/31/2024   No results found for: "KPAFRELGTCHN", "LAMBDASER", "KAPLAMBRATIO" No results found for: "IGGSERUM", "IGA", "IGMSERUM" No results found for: "TOTALPROTELP", "ALBUMINELP", "A1GS", "A2GS", "BETS", "BETA2SER", "GAMS", "MSPIKE", "SPEI"   Chemistry      Component Value Date/Time   NA 133 (L) 03/16/2024 0743   NA 138 03/10/2020 1138   K 4.5 03/16/2024 0743   CL 102 03/16/2024 0743   CO2 22 03/16/2024 0743   BUN 20 03/16/2024 0743   BUN 10 03/10/2020 1138   CREATININE 0.60 03/16/2024 0743   CREATININE 0.76 02/15/2024 1256      Component Value Date/Time   CALCIUM  9.3 03/16/2024 0743   ALKPHOS 78 03/16/2024 0743   AST 39 03/16/2024 0743   AST 14 (L) 02/15/2024 1256   ALT 43 03/16/2024 0743   ALT 10 02/15/2024 1256   BILITOT 0.6 03/16/2024 0743   BILITOT 0.3 02/15/2024 1256       Impression and Plan: Kristen Murray is a very pleasant 78 yo African American female with low-grade MDS, low IPSS-R score.  Iron  studies are pending. We will replace if needed.  Both Zarxio  and ESA  given this visit.  We will see her again in 1 month.   Kennard Pea, NP 6/2/20251:57 PM

## 2024-03-31 NOTE — Patient Instructions (Signed)
 Filgrastim  Injection What is this medication? FILGRASTIM  (fil GRA stim) lowers the risk of infection in people who are receiving chemotherapy. It works by Systems analyst make more white blood cells, which protects your body from infection. It may also be used to help people who have been exposed to high doses of radiation. It can be used to help prepare your body before a stem cell transplant. It works by helping your bone marrow make and release stem cells into the blood. This medicine may be used for other purposes; ask your health care provider or pharmacist if you have questions. COMMON BRAND NAME(S): Neupogen , Nivestym , Nypozi, Releuko , Zarxio  What should I tell my care team before I take this medication? They need to know if you have any of these conditions: History of blood diseases, such as sickle cell anemia Kidney disease Recent or ongoing radiation An unusual or allergic reaction to filgrastim , pegfilgrastim , latex, rubber, other medications, foods, dyes, or preservatives Pregnant or trying to get pregnant Breast-feeding How should I use this medication? This medication is injected under the skin or into a vein. It is usually given by your care team in a hospital or clinic setting. It may be given at home. If you get this medication at home, you will be taught how to prepare and give it. Use exactly as directed. Take it as directed on the prescription label at the same time every day. Keep taking it unless your care team tells you to stop. It is important that you put your used needles and syringes in a special sharps container. Do not put them in a trash can. If you do not have a sharps container, call your pharmacist or care team to get one. This medication comes with INSTRUCTIONS FOR USE. Ask your pharmacist for directions on how to use this medication. Read the information carefully. Talk to your pharmacist or care team if you have questions. Talk to your care team about the use of  this medication in children. While it may be prescribed for children for selected conditions, precautions do apply. Overdosage: If you think you have taken too much of this medicine contact a poison control center or emergency room at once. NOTE: This medicine is only for you. Do not share this medicine with others. What if I miss a dose? It is important not to miss any doses. Talk to your care team about what to do if you miss a dose. What may interact with this medication? Medications that may cause a release of neutrophils, such as lithium This list may not describe all possible interactions. Give your health care provider a list of all the medicines, herbs, non-prescription drugs, or dietary supplements you use. Also tell them if you smoke, drink alcohol, or use illegal drugs. Some items may interact with your medicine. What should I watch for while using this medication? Your condition will be monitored carefully while you are receiving this medication. You may need bloodwork while taking this medication. Talk to your care team about your risk of cancer. You may be more at risk for certain types of cancer if you take this medication. What side effects may I notice from receiving this medication? Side effects that you should report to your care team as soon as possible: Allergic reactions--skin rash, itching, hives, swelling of the face, lips, tongue, or throat Capillary leak syndrome--stomach or muscle pain, unusual weakness or fatigue, feeling faint or lightheaded, decrease in the amount of urine, swelling of the ankles, hands,  or feet, trouble breathing High white blood cell level--fever, fatigue, trouble breathing, night sweats, change in vision, weight loss Inflammation of the aorta--fever, fatigue, back, chest, or stomach pain, severe headache Kidney injury (glomerulonephritis)--decrease in the amount of urine, red or dark brown urine, foamy or bubbly urine, swelling of the ankles, hands,  or feet Shortness of breath or trouble breathing Spleen injury--pain in upper left stomach or shoulder Unusual bruising or bleeding Side effects that usually do not require medical attention (report to your care team if they continue or are bothersome): Back pain Bone pain Fatigue Fever Headache Nausea This list may not describe all possible side effects. Call your doctor for medical advice about side effects. You may report side effects to FDA at 1-800-FDA-1088. Where should I keep my medication? Keep out of the reach of children and pets. Keep this medication in the original packaging until you are ready to take it. Protect from light. See product for storage information. Each product may have different instructions. Get rid of any unused medication after the expiration date. To get rid of medications that are no longer needed or have expired: Take the medication to a medications take-back program. Check with your pharmacy or law enforcement to find a location. If you cannot return the medication, ask your pharmacist or care team how to get rid of this medication safely. NOTE: This sheet is a summary. It may not cover all possible information. If you have questions about this medicine, talk to your doctor, pharmacist, or health care provider.  2024 Elsevier/Gold Standard (2022-03-09 00:00:00)Darbepoetin Alfa  Injection What is this medication? DARBEPOETIN ALFA  (dar be POE e tin AL fa) treats low levels of red blood cells (anemia) caused by kidney disease or chemotherapy. It works by Systems analyst make more red blood cells, which reduces the need for blood transfusions. This medicine may be used for other purposes; ask your health care provider or pharmacist if you have questions. COMMON BRAND NAME(S): Aranesp  What should I tell my care team before I take this medication? They need to know if you have any of these conditions: Blood clots Cancer Heart disease High blood pressure On  dialysis Seizures Stroke An unusual or allergic reaction to darbepoetin, latex, other medications, foods, dyes, or preservatives Pregnant or trying to get pregnant Breast-feeding How should I use this medication? This medication is injected into a vein or under the skin. It is usually given by a care team in a hospital or clinic setting. It may also be given at home. If you get this medication at home, you will be taught how to prepare and give it. Use exactly as directed. Take it as directed on the prescription label at the same time every day. Keep taking it unless your care team tells you to stop. It is important that you put your used needles and syringes in a special sharps container. Do not put them in a trash can. If you do not have a sharps container, call your pharmacist or care team to get one. A special MedGuide will be given to you by the pharmacist with each prescription and refill. Be sure to read this information carefully each time. Talk to your care team about the use of this medication in children. While this medication may be used in children as young as 1 month of age for selected conditions, precautions do apply. Overdosage: If you think you have taken too much of this medicine contact a poison control center or emergency room  at once. NOTE: This medicine is only for you. Do not share this medicine with others. What if I miss a dose? If you miss a dose, take it as soon as you can. If it is almost time for your next dose, take only that dose. Do not take double or extra doses. What may interact with this medication? Epoetin  alfa Methoxy polyethylene glycol-epoetin  beta This list may not describe all possible interactions. Give your health care provider a list of all the medicines, herbs, non-prescription drugs, or dietary supplements you use. Also tell them if you smoke, drink alcohol, or use illegal drugs. Some items may interact with your medicine. What should I watch for  while using this medication? Visit your care team for regular checks on your progress. Check your blood pressure as directed. Know what your blood pressure should be and when to contact your care team. Your condition will be monitored carefully while you are receiving this medication. You may need blood work while taking this medication. What side effects may I notice from receiving this medication? Side effects that you should report to your care team as soon as possible: Allergic reactions--skin rash, itching, hives, swelling of the face, lips, tongue, or throat Blood clot--pain, swelling, or warmth in the leg, shortness of breath, chest pain Heart attack--pain or tightness in the chest, shoulders, arms, or jaw, nausea, shortness of breath, cold or clammy skin, feeling faint or lightheaded Increase in blood pressure Rash, fever, and swollen lymph nodes Redness, blistering, peeling, or loosening of the skin, including inside the mouth Seizures Stroke--sudden numbness or weakness of the face, arm, or leg, trouble speaking, confusion, trouble walking, loss of balance or coordination, dizziness, severe headache, change in vision Side effects that usually do not require medical attention (report to your care team if they continue or are bothersome): Cough Stomach pain Swelling of the ankles, hands, or feet This list may not describe all possible side effects. Call your doctor for medical advice about side effects. You may report side effects to FDA at 1-800-FDA-1088. Where should I keep my medication? Keep out of the reach of children and pets. Store in a refrigerator. Do not freeze. Do not shake. Protect from light. Keep this medication in the original container until you are ready to take it. See product for storage information. Get rid of any unused medication after the expiration date. To get rid of medications that are no longer needed or have expired: Take the medication to a medication  take-back program. Check with your pharmacy or law enforcement to find a location. If you cannot return the medication, ask your pharmacist or care team how to get rid of the medication safely. NOTE: This sheet is a summary. It may not cover all possible information. If you have questions about this medicine, talk to your doctor, pharmacist, or health care provider.  2024 Elsevier/Gold Standard (2022-02-15 00:00:00)

## 2024-03-31 NOTE — Telephone Encounter (Signed)
 Critical ANC of 0.3 reported by Dean Every in lab.  Patient being seen in office.  No orders received

## 2024-04-01 LAB — IRON AND IRON BINDING CAPACITY (CC-WL,HP ONLY)
Iron: 26 ug/dL — ABNORMAL LOW (ref 28–170)
Saturation Ratios: 12 % (ref 10.4–31.8)
TIBC: 223 ug/dL — ABNORMAL LOW (ref 250–450)
UIBC: 197 ug/dL (ref 148–442)

## 2024-04-10 ENCOUNTER — Encounter: Payer: Self-pay | Admitting: Cardiovascular Disease

## 2024-04-18 ENCOUNTER — Telehealth: Payer: Self-pay | Admitting: Cardiovascular Disease

## 2024-04-18 DIAGNOSIS — I4892 Unspecified atrial flutter: Secondary | ICD-10-CM

## 2024-04-18 MED ORDER — APIXABAN 5 MG PO TABS: 5.0000 mg | ORAL_TABLET | Freq: Two times a day (BID) | ORAL | 3 refills | Status: AC

## 2024-04-18 NOTE — Telephone Encounter (Signed)
 Prescription refill request for Eliquis  received. Indication:aflutter Last office visit:6/24 Scr:0.72  6/25 Age: 78 Weight:75.2  kg  Prescription refilled

## 2024-04-18 NOTE — Telephone Encounter (Signed)
*  STAT* If patient is at the pharmacy, call can be transferred to refill team.   1. Which medications need to be refilled? (please list name of each medication and dose if known) ELIQUIS  5 MG TABS tablet   2. Which pharmacy/location (including street and city if local pharmacy) is medication to be sent to? [facility] - [place], [place] - [address]   3. Do they need a 30 day or 90 day supply? 90

## 2024-04-30 ENCOUNTER — Inpatient Hospital Stay

## 2024-04-30 ENCOUNTER — Inpatient Hospital Stay: Admitting: Family

## 2024-04-30 ENCOUNTER — Inpatient Hospital Stay: Attending: Hematology & Oncology

## 2024-04-30 VITALS — BP 132/55 | HR 77 | Temp 98.4°F | Resp 18 | Ht 65.0 in | Wt 168.4 lb

## 2024-04-30 DIAGNOSIS — D500 Iron deficiency anemia secondary to blood loss (chronic): Secondary | ICD-10-CM

## 2024-04-30 DIAGNOSIS — D46Z Other myelodysplastic syndromes: Secondary | ICD-10-CM

## 2024-04-30 DIAGNOSIS — D709 Neutropenia, unspecified: Secondary | ICD-10-CM

## 2024-04-30 DIAGNOSIS — D508 Other iron deficiency anemias: Secondary | ICD-10-CM | POA: Insufficient documentation

## 2024-04-30 DIAGNOSIS — K909 Intestinal malabsorption, unspecified: Secondary | ICD-10-CM | POA: Insufficient documentation

## 2024-04-30 DIAGNOSIS — D4620 Refractory anemia with excess of blasts, unspecified: Secondary | ICD-10-CM | POA: Insufficient documentation

## 2024-04-30 DIAGNOSIS — D72819 Decreased white blood cell count, unspecified: Secondary | ICD-10-CM

## 2024-04-30 LAB — CBC WITH DIFFERENTIAL (CANCER CENTER ONLY)
Abs Immature Granulocytes: 0.07 10*3/uL (ref 0.00–0.07)
Basophils Absolute: 0 10*3/uL (ref 0.0–0.1)
Basophils Relative: 0 %
Eosinophils Absolute: 0 10*3/uL (ref 0.0–0.5)
Eosinophils Relative: 0 %
HCT: 31.9 % — ABNORMAL LOW (ref 36.0–46.0)
Hemoglobin: 10.3 g/dL — ABNORMAL LOW (ref 12.0–15.0)
Immature Granulocytes: 3 %
Lymphocytes Relative: 74 %
Lymphs Abs: 1.8 10*3/uL (ref 0.7–4.0)
MCH: 27.3 pg (ref 26.0–34.0)
MCHC: 32.3 g/dL (ref 30.0–36.0)
MCV: 84.6 fL (ref 80.0–100.0)
Monocytes Absolute: 0.4 10*3/uL (ref 0.1–1.0)
Monocytes Relative: 14 %
Neutro Abs: 0.2 10*3/uL — CL (ref 1.7–7.7)
Neutrophils Relative %: 9 %
Platelet Count: 184 10*3/uL (ref 150–400)
RBC: 3.77 MIL/uL — ABNORMAL LOW (ref 3.87–5.11)
RDW: 18 % — ABNORMAL HIGH (ref 11.5–15.5)
Smear Review: NORMAL
WBC Count: 2.5 10*3/uL — ABNORMAL LOW (ref 4.0–10.5)
nRBC: 0 % (ref 0.0–0.2)

## 2024-04-30 LAB — CMP (CANCER CENTER ONLY)
ALT: 11 U/L (ref 0–44)
AST: 16 U/L (ref 15–41)
Albumin: 3.9 g/dL (ref 3.5–5.0)
Alkaline Phosphatase: 90 U/L (ref 38–126)
Anion gap: 7 (ref 5–15)
BUN: 15 mg/dL (ref 8–23)
CO2: 29 mmol/L (ref 22–32)
Calcium: 10.1 mg/dL (ref 8.9–10.3)
Chloride: 99 mmol/L (ref 98–111)
Creatinine: 0.68 mg/dL (ref 0.44–1.00)
GFR, Estimated: >60 mL/min (ref >60)
Glucose, Bld: 280 mg/dL — ABNORMAL HIGH (ref 70–99)
Potassium: 4.5 mmol/L (ref 3.5–5.1)
Sodium: 135 mmol/L (ref 135–145)
Total Bilirubin: 0.3 mg/dL (ref 0.0–1.2)
Total Protein: 7.9 g/dL (ref 6.5–8.1)

## 2024-04-30 LAB — IRON AND IRON BINDING CAPACITY (CC-WL,HP ONLY)
Iron: 27 ug/dL — ABNORMAL LOW (ref 28–170)
Saturation Ratios: 11 % (ref 10.4–31.8)
TIBC: 248 ug/dL — ABNORMAL LOW (ref 250–450)
UIBC: 221 ug/dL (ref 148–442)

## 2024-04-30 LAB — FERRITIN: Ferritin: 1184 ng/mL — ABNORMAL HIGH (ref 11–307)

## 2024-04-30 LAB — RETICULOCYTES
Immature Retic Fract: 9.8 % (ref 2.3–15.9)
RBC.: 3.82 MIL/uL — ABNORMAL LOW (ref 3.87–5.11)
Retic Count, Absolute: 53.1 10*3/uL (ref 19.0–186.0)
Retic Ct Pct: 1.4 % (ref 0.4–3.1)

## 2024-04-30 MED ORDER — FILGRASTIM-SNDZ 480 MCG/0.8ML IJ SOSY
480.0000 ug | PREFILLED_SYRINGE | Freq: Once | INTRAMUSCULAR | Status: AC
  Administered 2024-04-30: 480 ug via SUBCUTANEOUS
  Filled 2024-04-30: qty 0.8

## 2024-04-30 MED ORDER — DARBEPOETIN ALFA 300 MCG/0.6ML IJ SOSY
300.0000 ug | PREFILLED_SYRINGE | Freq: Once | INTRAMUSCULAR | Status: AC
  Administered 2024-04-30: 300 ug via SUBCUTANEOUS
  Filled 2024-04-30: qty 0.6

## 2024-04-30 NOTE — Patient Instructions (Signed)
 Darbepoetin Alfa  Injection What is this medication? DARBEPOETIN ALFA  (dar be POE e tin AL fa) treats low levels of red blood cells (anemia) caused by kidney disease or chemotherapy. It works by Systems analyst make more red blood cells, which reduces the need for blood transfusions. This medicine may be used for other purposes; ask your health care provider or pharmacist if you have questions. COMMON BRAND NAME(S): Aranesp  What should I tell my care team before I take this medication? They need to know if you have any of these conditions: Blood clots Cancer Heart disease High blood pressure On dialysis Seizures Stroke An unusual or allergic reaction to darbepoetin, latex, other medications, foods, dyes, or preservatives Pregnant or trying to get pregnant Breast-feeding How should I use this medication? This medication is injected into a vein or under the skin. It is usually given by a care team in a hospital or clinic setting. It may also be given at home. If you get this medication at home, you will be taught how to prepare and give it. Use exactly as directed. Take it as directed on the prescription label at the same time every day. Keep taking it unless your care team tells you to stop. It is important that you put your used needles and syringes in a special sharps container. Do not put them in a trash can. If you do not have a sharps container, call your pharmacist or care team to get one. A special MedGuide will be given to you by the pharmacist with each prescription and refill. Be sure to read this information carefully each time. Talk to your care team about the use of this medication in children. While this medication may be used in children as young as 1 month of age for selected conditions, precautions do apply. Overdosage: If you think you have taken too much of this medicine contact a poison control center or emergency room at once. NOTE: This medicine is only for you. Do not  share this medicine with others. What if I miss a dose? If you miss a dose, take it as soon as you can. If it is almost time for your next dose, take only that dose. Do not take double or extra doses. What may interact with this medication? Epoetin  alfa Methoxy polyethylene glycol-epoetin  beta This list may not describe all possible interactions. Give your health care provider a list of all the medicines, herbs, non-prescription drugs, or dietary supplements you use. Also tell them if you smoke, drink alcohol , or use illegal drugs. Some items may interact with your medicine. What should I watch for while using this medication? Visit your care team for regular checks on your progress. Check your blood pressure as directed. Know what your blood pressure should be and when to contact your care team. Your condition will be monitored carefully while you are receiving this medication. You may need blood work while taking this medication. What side effects may I notice from receiving this medication? Side effects that you should report to your care team as soon as possible: Allergic reactions--skin rash, itching, hives, swelling of the face, lips, tongue, or throat Blood clot--pain, swelling, or warmth in the leg, shortness of breath, chest pain Heart attack--pain or tightness in the chest, shoulders, arms, or jaw, nausea, shortness of breath, cold or clammy skin, feeling faint or lightheaded Increase in blood pressure Rash, fever, and swollen lymph nodes Redness, blistering, peeling, or loosening of the skin, including inside the mouth Seizures  Stroke--sudden numbness or weakness of the face, arm, or leg, trouble speaking, confusion, trouble walking, loss of balance or coordination, dizziness, severe headache, change in vision Side effects that usually do not require medical attention (report to your care team if they continue or are bothersome): Cough Stomach pain Swelling of the ankles, hands, or  feet This list may not describe all possible side effects. Call your doctor for medical advice about side effects. You may report side effects to FDA at 1-800-FDA-1088. Where should I keep my medication? Keep out of the reach of children and pets. Store in a refrigerator. Do not freeze. Do not shake. Protect from light. Keep this medication in the original container until you are ready to take it. See product for storage information. Get rid of any unused medication after the expiration date. To get rid of medications that are no longer needed or have expired: Take the medication to a medication take-back program. Check with your pharmacy or law enforcement to find a location. If you cannot return the medication, ask your pharmacist or care team how to get rid of the medication safely. NOTE: This sheet is a summary. It may not cover all possible information. If you have questions about this medicine, talk to your doctor, pharmacist, or health care provider.  2024 Elsevier/Gold Standard (2022-02-15 00:00:00) Filgrastim  Injection What is this medication? FILGRASTIM  (fil GRA stim) lowers the risk of infection in people who are receiving chemotherapy. It works by Systems analyst make more white blood cells, which protects your body from infection. It may also be used to help people who have been exposed to high doses of radiation. It can be used to help prepare your body before a stem cell transplant. It works by helping your bone marrow make and release stem cells into the blood. This medicine may be used for other purposes; ask your health care provider or pharmacist if you have questions. COMMON BRAND NAME(S): Neupogen , Nivestym , Nypozi, Releuko , Zarxio  What should I tell my care team before I take this medication? They need to know if you have any of these conditions: History of blood diseases, such as sickle cell anemia Kidney disease Recent or ongoing radiation An unusual or allergic  reaction to filgrastim , pegfilgrastim, latex, rubber, other medications, foods, dyes, or preservatives Pregnant or trying to get pregnant Breast-feeding How should I use this medication? This medication is injected under the skin or into a vein. It is usually given by your care team in a hospital or clinic setting. It may be given at home. If you get this medication at home, you will be taught how to prepare and give it. Use exactly as directed. Take it as directed on the prescription label at the same time every day. Keep taking it unless your care team tells you to stop. It is important that you put your used needles and syringes in a special sharps container. Do not put them in a trash can. If you do not have a sharps container, call your pharmacist or care team to get one. This medication comes with INSTRUCTIONS FOR USE. Ask your pharmacist for directions on how to use this medication. Read the information carefully. Talk to your pharmacist or care team if you have questions. Talk to your care team about the use of this medication in children. While it may be prescribed for children for selected conditions, precautions do apply. Overdosage: If you think you have taken too much of this medicine contact a poison control  center or emergency room at once. NOTE: This medicine is only for you. Do not share this medicine with others. What if I miss a dose? It is important not to miss any doses. Talk to your care team about what to do if you miss a dose. What may interact with this medication? Medications that may cause a release of neutrophils, such as lithium This list may not describe all possible interactions. Give your health care provider a list of all the medicines, herbs, non-prescription drugs, or dietary supplements you use. Also tell them if you smoke, drink alcohol , or use illegal drugs. Some items may interact with your medicine. What should I watch for while using this medication? Your  condition will be monitored carefully while you are receiving this medication. You may need bloodwork while taking this medication. Talk to your care team about your risk of cancer. You may be more at risk for certain types of cancer if you take this medication. What side effects may I notice from receiving this medication? Side effects that you should report to your care team as soon as possible: Allergic reactions--skin rash, itching, hives, swelling of the face, lips, tongue, or throat Capillary leak syndrome--stomach or muscle pain, unusual weakness or fatigue, feeling faint or lightheaded, decrease in the amount of urine, swelling of the ankles, hands, or feet, trouble breathing High white blood cell level--fever, fatigue, trouble breathing, night sweats, change in vision, weight loss Inflammation of the aorta--fever, fatigue, back, chest, or stomach pain, severe headache Kidney injury (glomerulonephritis)--decrease in the amount of urine, red or dark brown urine, foamy or bubbly urine, swelling of the ankles, hands, or feet Shortness of breath or trouble breathing Spleen injury--pain in upper left stomach or shoulder Unusual bruising or bleeding Side effects that usually do not require medical attention (report to your care team if they continue or are bothersome): Back pain Bone pain Fatigue Fever Headache Nausea This list may not describe all possible side effects. Call your doctor for medical advice about side effects. You may report side effects to FDA at 1-800-FDA-1088. Where should I keep my medication? Keep out of the reach of children and pets. Keep this medication in the original packaging until you are ready to take it. Protect from light. See product for storage information. Each product may have different instructions. Get rid of any unused medication after the expiration date. To get rid of medications that are no longer needed or have expired: Take the medication to a  medications take-back program. Check with your pharmacy or law enforcement to find a location. If you cannot return the medication, ask your pharmacist or care team how to get rid of this medication safely. NOTE: This sheet is a summary. It may not cover all possible information. If you have questions about this medicine, talk to your doctor, pharmacist, or health care provider.  2024 Elsevier/Gold Standard (2022-03-09 00:00:00)

## 2024-04-30 NOTE — Progress Notes (Signed)
 Hematology and Oncology Follow Up Visit  Kristen Murray [id number] 1946-05-24 78 y.o. 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 Hauschild is here today for follow-up. She is still getting over the bout of pneumonia that put her in the hospital back in May. She has completed her antibiotics but is still taking Mucinex .  She still has a dry cough.  No fever, chills, n/v, rash, dizziness, SOB, chest pain, palpitations, abdominal pain or changes in bowel or bladder habits.  WBC count is 2.5, ANC 0.2, Hgb 10.3, MCV 84 and platelets 184.  No blood loss noted. No bruising or petechiae.  No swelling, tenderness, numbness or tingling in her extremities.  No falls or syncope.  Appetite and hydration are good. Weight is stable at 172 lbs.   ECOG Performance Status: 1 - Symptomatic but completely ambulatory  Medications:  Allergies as of 04/30/2024       Reactions   Codeine Hives, Other (See Comments)   Because of a history of documented adverse serious drug reaction, Medi Alert bracelet  is recommended   Macrodantin  [nitrofurantoin  Macrocrystal] Other (See Comments)   Numbness, aching all over   Other Shortness Of Breath, Rash, Other (See Comments)   Pine nuts & walnuts throat congestion. No documented angioedema.   Propoxyphene Hives, Swelling, Rash, Other (See Comments)   Propoxyphene Hcl Hives, Swelling, Rash   Augmentin  [amoxicillin -pot Clavulanate] Itching   Dulaglutide Other (See Comments)   Other reaction(s): weak, ha, no appetite   Latex Rash, Other (See Comments)   Metformin  Hcl Other (See Comments)   Other reaction(s): leg discomfort   Neosporin [bacitracin-polymyxin B] Other (See Comments)   Blisters   Zoster Vac  Recomb Adjuvanted Rash, Other (See Comments)   Other reaction(s): confusion   Avocado Rash   Banana Rash   Pine nuts   Ciprofloxacin  Nausea Only, Other (See Comments)   Nausea (Note: Patient takes this when on mission trips, however)   Tizanidine Other (See Comments)   Reaction not recalled   Tramadol  Nausea Only        Medication List        Accurate as of April 30, 2024  2:43 PM. If you have any questions, ask your nurse or doctor.          apixaban  5 MG Tabs tablet Commonly known as: Eliquis  Take 1 tablet (5 mg total) by mouth 2 (two) times daily.   ASCORBIC ACID PO Take 1 tablet by mouth daily. Dosage unknown   azelastine 0.1 % nasal spray Commonly known as: ASTELIN Place into both nostrils 2 (two) times daily. Use in each nostril as directed   b complex vitamins capsule Take 1 capsule by mouth daily.   cetirizine  10 MG tablet Commonly known as: ZYRTEC  Take 1 tablet (10 mg total) by mouth daily as needed for allergies (Can take an extra dose during flare ups.).   Cholecalciferol 25 MCG (1000 UT) capsule Take 1,000 Units by mouth daily.   cycloSPORINE 0.05 % ophthalmic emulsion Commonly known as: RESTASIS Place 1 drop into both eyes every Monday, Wednesday, and Friday.   dextromethorphan -guaiFENesin  30-600 MG 12hr tablet Commonly known as: MUCINEX  DM Take 1 tablet  by mouth 2 (two) times daily.   DropSafe Alcohol Prep 70 % Pads Apply topically.   EPINEPHrine  0.3 mg/0.3 mL Soaj injection Commonly known as: EpiPen  2-Pak Inject 0.3 mg into the muscle as needed for anaphylaxis.   estradiol 0.1 MG/GM vaginal cream Commonly known as: ESTRACE Place 1 Applicatorful vaginally once a week. 1-2 weekly   fluorometholone 0.1 % ophthalmic suspension Commonly known as: FML Place 1 drop into both eyes every Monday, Wednesday, and Friday.   fluticasone  50 MCG/ACT nasal spray Commonly known as: FLONASE  Place 1 spray into both nostrils 2 (two) times daily.    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   glipiZIDE  10 MG 24 hr tablet Commonly known as: GLUCOTROL  XL Take 20 mg by mouth daily.   glucose blood test strip Inject 1 strip into the skin daily.   glucose blood test strip Commonly known as: FREESTYLE LITE CHECK BLOOD SUGAR ONCE DAILY AS DIRECTED.  DX:790.29   Lantus  SoloStar 100 UNIT/ML Solostar Pen Generic drug: insulin  glargine Inject 10 Units into the skin daily.   levothyroxine  25 MCG tablet Commonly known as: SYNTHROID  Take 25 mcg by mouth daily.   lidocaine  5 % ointment Commonly known as: XYLOCAINE  Apply 1 application topically 3 (three) times daily as needed.   metoprolol  tartrate 50 MG tablet Commonly known as: LOPRESSOR  Take 1.5 tablets (75 mg total) by mouth 2 (two) times daily. Pt needs to schedule appt with provider by April, 2025 to avoid any missed doses   montelukast  10 MG tablet Commonly known as: SINGULAIR  Take 1 tablet (10 mg total) by mouth at bedtime.   multivitamin tablet Take 1 tablet by mouth daily. Shaklee brand   nitroGLYCERIN  0.4 MG SL tablet Commonly known as: NITROSTAT  Place 1 tablet (0.4 mg total) under the tongue every 5 (five) minutes as needed for chest pain.   pantoprazole  40 MG tablet Commonly known as: PROTONIX  Take 1 tablet (40 mg total) by mouth 2 (two) times daily.   rosuvastatin  5 MG tablet Commonly known as: CRESTOR  Take 1 tablet (5 mg total) by mouth daily.   trolamine salicylate 10 % cream Commonly known as: ASPERCREME Apply 1 application topically as needed for muscle pain.   valACYclovir  500 MG tablet Commonly known as: VALTREX  Take 1 tablet (500 mg total) by mouth 2 (two) times daily. Take twice a day for thee days as needed for an outbreak.   verapamil  180 MG CR tablet Commonly known as: CALAN -SR Take 180 mg by mouth at  bedtime.        Allergies:  Allergies  Allergen Reactions   Codeine Hives and Other (See Comments)    Because of a history of documented adverse serious drug reaction, Medi Alert bracelet  is recommended   Macrodantin  [Nitrofurantoin  Macrocrystal] Other (See Comments)    Numbness, aching all over   Other Shortness Of Breath, Rash and Other (See Comments)    Pine nuts & walnuts throat congestion. No documented angioedema.    Propoxyphene Hives, Swelling, Rash and Other (See Comments)   Propoxyphene Hcl Hives, Swelling and Rash   Augmentin  [Amoxicillin -Pot Clavulanate] Itching   Dulaglutide Other (See Comments)    Other reaction(s): weak, ha, no appetite   Latex Rash and Other (See Comments)   Metformin  Hcl Other (See Comments)    Other reaction(s): leg discomfort   Neosporin [Bacitracin-Polymyxin B] Other (  See Comments)    Blisters   Zoster Vac Recomb Adjuvanted Rash and Other (See Comments)    Other reaction(s): confusion   Avocado Rash   Banana Rash    Pine nuts   Ciprofloxacin  Nausea Only and Other (See Comments)    Nausea (Note: Patient takes this when on mission trips, however)   Tizanidine Other (See Comments)    Reaction not recalled   Tramadol  Nausea Only    Past Medical History, Surgical history, Social history, and Family History were reviewed and updated.  Review of Systems: All other 10 point review of systems is negative.   Physical Exam:  height is 5' 5 (1.651 m) and weight is 168 lb 6.4 oz (76.4 kg). Her oral temperature is 98.4 F (36.9 C). Her blood pressure is 132/55 (abnormal) and her pulse is 77. Her respiration is 18 and oxygen saturation is 100%.   Wt Readings from Last 3 Encounters:  04/30/24 168 lb 6.4 oz (76.4 kg)  03/31/24 165 lb 12.8 oz (75.2 kg)  03/15/24 167 lb (75.8 kg)    Ocular: Sclerae unicteric, pupils equal, round and reactive to light Ear-nose-throat: Oropharynx clear, dentition fair Lymphatic: No cervical or supraclavicular  adenopathy Lungs no rales or rhonchi, good excursion bilaterally Heart regular rate and rhythm, no murmur appreciated Abd soft, nontender, positive bowel sounds MSK no focal spinal tenderness, no joint edema Neuro: non-focal, well-oriented, appropriate affect Breasts: Deferred   Lab Results  Component Value Date   WBC 2.5 (L) 04/30/2024   HGB 10.3 (L) 04/30/2024   HCT 31.9 (L) 04/30/2024   MCV 84.6 04/30/2024   PLT 184 04/30/2024   Lab Results  Component Value Date   FERRITIN 1,492 (H) 03/31/2024   IRON  26 (L) 03/31/2024   TIBC 223 (L) 03/31/2024   UIBC 197 03/31/2024   IRONPCTSAT 12 03/31/2024   Lab Results  Component Value Date   RETICCTPCT 1.4 04/30/2024   RBC 3.77 (L) 04/30/2024   RBC 3.82 (L) 04/30/2024   No results found for: KPAFRELGTCHN, LAMBDASER, KAPLAMBRATIO No results found for: IGGSERUM, IGA, IGMSERUM No results found for: STEPHANY CARLOTA BENSON MARKEL EARLA JOANNIE DOC VICK, SPEI   Chemistry      Component Value Date/Time   NA 135 04/30/2024 1308   NA 138 03/10/2020 1138   K 4.5 04/30/2024 1308   CL 99 04/30/2024 1308   CO2 29 04/30/2024 1308   BUN 15 04/30/2024 1308   BUN 10 03/10/2020 1138   CREATININE 0.68 04/30/2024 1308      Component Value Date/Time   CALCIUM  10.1 04/30/2024 1308   ALKPHOS 90 04/30/2024 1308   AST 16 04/30/2024 1308   ALT 11 04/30/2024 1308   BILITOT 0.3 04/30/2024 1308       Impression and Plan: Ms. Lienhard is a very pleasant 78 yo African American female with low-grade MDS, low IPSS-R score.  Iron  studies are pending. We will replace if needed.  Both Zarxio  and ESA given this visit.  We will see her again in 1 month.   Lauraine Pepper, NP 7/2/20252:43 PM

## 2024-05-13 LAB — HM MAMMOGRAPHY

## 2024-05-19 ENCOUNTER — Inpatient Hospital Stay

## 2024-05-19 ENCOUNTER — Encounter: Payer: Self-pay | Admitting: Obstetrics and Gynecology

## 2024-05-26 ENCOUNTER — Inpatient Hospital Stay

## 2024-05-26 VITALS — BP 128/50 | HR 72 | Temp 98.2°F | Resp 18

## 2024-05-26 DIAGNOSIS — D72819 Decreased white blood cell count, unspecified: Secondary | ICD-10-CM

## 2024-05-26 DIAGNOSIS — D46Z Other myelodysplastic syndromes: Secondary | ICD-10-CM

## 2024-05-26 DIAGNOSIS — D500 Iron deficiency anemia secondary to blood loss (chronic): Secondary | ICD-10-CM

## 2024-05-26 DIAGNOSIS — D4620 Refractory anemia with excess of blasts, unspecified: Secondary | ICD-10-CM | POA: Diagnosis not present

## 2024-05-26 MED ORDER — SODIUM CHLORIDE 0.9 % IV SOLN: INTRAVENOUS | Status: DC

## 2024-05-26 MED ORDER — IRON SUCROSE 20 MG/ML IV SOLN
200.0000 mg | Freq: Once | INTRAVENOUS | Status: AC
  Administered 2024-05-26: 200 mg via INTRAVENOUS
  Filled 2024-05-26: qty 10

## 2024-05-26 NOTE — Patient Instructions (Signed)

## 2024-05-28 DIAGNOSIS — R351 Nocturia: Secondary | ICD-10-CM | POA: Insufficient documentation

## 2024-06-02 ENCOUNTER — Inpatient Hospital Stay: Admitting: Family

## 2024-06-02 ENCOUNTER — Telehealth: Payer: Self-pay | Admitting: *Deleted

## 2024-06-02 ENCOUNTER — Inpatient Hospital Stay: Attending: Hematology & Oncology

## 2024-06-02 ENCOUNTER — Inpatient Hospital Stay

## 2024-06-02 ENCOUNTER — Encounter: Payer: Self-pay | Admitting: Family

## 2024-06-02 ENCOUNTER — Ambulatory Visit

## 2024-06-02 VITALS — BP 140/69 | HR 78 | Temp 97.6°F | Resp 20 | Ht 65.0 in | Wt 168.1 lb

## 2024-06-02 DIAGNOSIS — D46Z Other myelodysplastic syndromes: Secondary | ICD-10-CM

## 2024-06-02 DIAGNOSIS — D500 Iron deficiency anemia secondary to blood loss (chronic): Secondary | ICD-10-CM

## 2024-06-02 DIAGNOSIS — D4620 Refractory anemia with excess of blasts, unspecified: Secondary | ICD-10-CM | POA: Insufficient documentation

## 2024-06-02 DIAGNOSIS — D72819 Decreased white blood cell count, unspecified: Secondary | ICD-10-CM

## 2024-06-02 LAB — IRON AND IRON BINDING CAPACITY (CC-WL,HP ONLY)
Iron: 64 ug/dL (ref 28–170)
Saturation Ratios: 29 % (ref 10.4–31.8)
TIBC: 218 ug/dL — ABNORMAL LOW (ref 250–450)
UIBC: 154 ug/dL

## 2024-06-02 LAB — CBC WITH DIFFERENTIAL (CANCER CENTER ONLY)
Abs Immature Granulocytes: 0.03 K/uL (ref 0.00–0.07)
Basophils Absolute: 0 K/uL (ref 0.0–0.1)
Basophils Relative: 0 %
Eosinophils Absolute: 0 K/uL (ref 0.0–0.5)
Eosinophils Relative: 1 %
HCT: 36.3 % (ref 36.0–46.0)
Hemoglobin: 11.5 g/dL — ABNORMAL LOW (ref 12.0–15.0)
Immature Granulocytes: 2 %
Lymphocytes Relative: 73 %
Lymphs Abs: 1.5 K/uL (ref 0.7–4.0)
MCH: 26.5 pg (ref 26.0–34.0)
MCHC: 31.7 g/dL (ref 30.0–36.0)
MCV: 83.6 fL (ref 80.0–100.0)
Monocytes Absolute: 0.3 K/uL (ref 0.1–1.0)
Monocytes Relative: 14 %
Neutro Abs: 0.2 K/uL — CL (ref 1.7–7.7)
Neutrophils Relative %: 10 %
Platelet Count: 156 K/uL (ref 150–400)
RBC: 4.34 MIL/uL (ref 3.87–5.11)
RDW: 17.3 % — ABNORMAL HIGH (ref 11.5–15.5)
WBC Count: 2.1 K/uL — ABNORMAL LOW (ref 4.0–10.5)
nRBC: 0 % (ref 0.0–0.2)

## 2024-06-02 LAB — CMP (CANCER CENTER ONLY)
ALT: 20 U/L (ref 0–44)
AST: 23 U/L (ref 15–41)
Albumin: 3.8 g/dL (ref 3.5–5.0)
Alkaline Phosphatase: 105 U/L (ref 38–126)
Anion gap: 10 (ref 5–15)
BUN: 10 mg/dL (ref 8–23)
CO2: 26 mmol/L (ref 22–32)
Calcium: 9.9 mg/dL (ref 8.9–10.3)
Chloride: 100 mmol/L (ref 98–111)
Creatinine: 0.57 mg/dL (ref 0.44–1.00)
GFR, Estimated: >60 mL/min (ref >60)
Glucose, Bld: 322 mg/dL — ABNORMAL HIGH (ref 70–99)
Potassium: 4.6 mmol/L (ref 3.5–5.1)
Sodium: 136 mmol/L (ref 135–145)
Total Bilirubin: <0.2 mg/dL (ref 0.0–1.2)
Total Protein: 7.7 g/dL (ref 6.5–8.1)

## 2024-06-02 LAB — RETICULOCYTES
Immature Retic Fract: 2.7 % (ref 2.3–15.9)
RBC.: 4.23 MIL/uL (ref 3.87–5.11)
Retic Count, Absolute: 14.2 K/uL — ABNORMAL LOW (ref 19.0–186.0)
Retic Ct Pct: <0.4 % — ABNORMAL LOW (ref 0.4–3.1)

## 2024-06-02 LAB — FERRITIN: Ferritin: 1668 ng/mL — ABNORMAL HIGH (ref 11–307)

## 2024-06-02 MED ORDER — IRON SUCROSE 20 MG/ML IV SOLN
200.0000 mg | Freq: Once | INTRAVENOUS | Status: AC
  Administered 2024-06-02: 200 mg via INTRAVENOUS
  Filled 2024-06-02: qty 10

## 2024-06-02 MED ORDER — SODIUM CHLORIDE 0.9 % IV SOLN: Freq: Once | INTRAVENOUS | Status: AC

## 2024-06-02 NOTE — Progress Notes (Signed)
 Hematology and Oncology Follow Up Visit  Kristen Murray [id number] January 28, 1946 78 y.o. 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 She is feeling much better. Cough is almost gone.  Mild fatigue at times.  No blood loss noted.  No fever, chills, n/v, rash, dizziness, SOB, chest pain, palpitations, abdominal pain or changes in bowel or bladder habits at this time.  No swelling, tenderness, numbness or tingling in her extremities.  No falls or syncope reported.  Appetite and hydration are good. Weight is stable at 168 lbs.   ECOG Performance Status: 1 - Symptomatic but completely ambulatory  Medications:  Allergies as of 06/02/2024       Reactions   Codeine Hives, Other (See Comments)   Because of a history of documented adverse serious drug reaction, Medi Alert bracelet  is recommended   Macrodantin  [nitrofurantoin  Macrocrystal] Other (See Comments)   Numbness, aching all over   Other Shortness Of Breath, Rash, Other (See Comments)   Pine nuts & walnuts throat congestion. No documented angioedema.   Propoxyphene Hives, Swelling, Rash, Other (See Comments)   Propoxyphene Hcl Hives, Swelling, Rash   Augmentin  [amoxicillin -pot Clavulanate] Itching   Dulaglutide Other (See Comments)   Other reaction(s): weak, ha, no appetite   Latex Rash, Other (See Comments)   Metformin  Hcl Other (See Comments)   Other reaction(s): leg discomfort   Neosporin [bacitracin-polymyxin B] Other (See Comments)   Blisters   Zoster Vac Recomb Adjuvanted Rash, Other (See Comments)   Other reaction(s): confusion   Avocado Rash   Banana Rash   Pine nuts   Ciprofloxacin  Nausea Only, Other (See Comments)    Nausea (Note: Patient takes this when on mission trips, however)   Tizanidine Other (See Comments)   Reaction not recalled   Tramadol  Nausea Only        Medication List        Accurate as of June 02, 2024  1:13 PM. If you have any questions, ask your nurse or doctor.          apixaban  5 MG Tabs tablet Commonly known as: Eliquis  Take 1 tablet (5 mg total) by mouth 2 (two) times daily.   ASCORBIC ACID PO Take 1 tablet by mouth daily. Dosage unknown   azelastine 0.1 % nasal spray Commonly known as: ASTELIN Place into both nostrils 2 (two) times daily. Use in each nostril as directed   b complex vitamins capsule Take 1 capsule by mouth daily.   cetirizine  10 MG tablet Commonly known as: ZYRTEC  Take 1 tablet (10 mg total) by mouth daily as needed for allergies (Can take an extra dose during flare ups.).   Cholecalciferol 25 MCG (1000 UT) capsule Take 1,000 Units by mouth daily.   cycloSPORINE 0.05 % ophthalmic emulsion Commonly known as: RESTASIS Place 1 drop into both eyes every Monday, Wednesday, and Friday.   dextromethorphan -guaiFENesin  30-600 MG 12hr tablet Commonly known as: MUCINEX  DM Take 1 tablet by mouth 2 (two) times daily.   DropSafe Alcohol Prep 70 % Pads Apply topically.   EPINEPHrine  0.3 mg/0.3 mL Soaj injection Commonly known as: EpiPen  2-Pak Inject 0.3 mg into the  muscle as needed for anaphylaxis.   estradiol 0.1 MG/GM vaginal cream Commonly known as: ESTRACE Place 1 Applicatorful vaginally once a week. 1-2 weekly   fluorometholone 0.1 % ophthalmic suspension Commonly known as: FML Place 1 drop into both eyes every Monday, Wednesday, and Friday.   fluticasone  50 MCG/ACT nasal spray Commonly known as: FLONASE  Place 1 spray into both nostrils 2 (two) times daily.   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    glipiZIDE  10 MG 24 hr tablet Commonly known as: GLUCOTROL  XL Take 20 mg by mouth daily.   glucose blood test strip Inject 1 strip into the skin daily.   glucose blood test strip Commonly known as: FREESTYLE LITE CHECK BLOOD SUGAR ONCE DAILY AS DIRECTED.  DX:790.29   Lantus  SoloStar 100 UNIT/ML Solostar Pen Generic drug: insulin  glargine Inject 10 Units into the skin daily.   levothyroxine  25 MCG tablet Commonly known as: SYNTHROID  Take 25 mcg by mouth daily.   lidocaine  5 % ointment Commonly known as: XYLOCAINE  Apply 1 application topically 3 (three) times daily as needed.   metoprolol  tartrate 50 MG tablet Commonly known as: LOPRESSOR  Take 1.5 tablets (75 mg total) by mouth 2 (two) times daily. Pt needs to schedule appt with provider by April, 2025 to avoid any missed doses   montelukast  10 MG tablet Commonly known as: SINGULAIR  Take 1 tablet (10 mg total) by mouth at bedtime.   multivitamin tablet Take 1 tablet by mouth daily. Shaklee brand   nitroGLYCERIN  0.4 MG SL tablet Commonly known as: NITROSTAT  Place 1 tablet (0.4 mg total) under the tongue every 5 (five) minutes as needed for chest pain.   pantoprazole  40 MG tablet Commonly known as: PROTONIX  Take 1 tablet (40 mg total) by mouth 2 (two) times daily.   rosuvastatin  5 MG tablet Commonly known as: CRESTOR  Take 1 tablet (5 mg total) by mouth daily.   trolamine salicylate 10 % cream Commonly known as: ASPERCREME Apply 1 application topically as needed for muscle pain.   valACYclovir  500 MG tablet Commonly known as: VALTREX  Take 1 tablet (500 mg total) by mouth 2 (two) times daily. Take twice a day for thee days as needed for an outbreak.   verapamil  180 MG CR tablet Commonly known as: CALAN -SR Take 180 mg by mouth at bedtime.        Allergies:  Allergies  Allergen Reactions   Codeine Hives and Other (See Comments)    Because of a history of documented adverse serious drug reaction, Medi Alert  bracelet  is recommended   Macrodantin  [Nitrofurantoin  Macrocrystal] Other (See Comments)    Numbness, aching all over   Other Shortness Of Breath, Rash and Other (See Comments)    Pine nuts & walnuts throat congestion. No documented angioedema.    Propoxyphene Hives, Swelling, Rash and Other (See Comments)   Propoxyphene Hcl Hives, Swelling and Rash   Augmentin  [Amoxicillin -Pot Clavulanate] Itching   Dulaglutide Other (See Comments)    Other reaction(s): weak, ha, no appetite   Latex Rash and Other (See Comments)   Metformin  Hcl Other (See Comments)    Other reaction(s): leg discomfort   Neosporin [Bacitracin-Polymyxin B] Other (See Comments)    Blisters   Zoster Vac Recomb Adjuvanted Rash and Other (See Comments)    Other reaction(s): confusion   Avocado Rash   Banana Rash  Pine nuts   Ciprofloxacin  Nausea Only and Other (See Comments)    Nausea (Note: Patient takes this when on mission trips, however)   Tizanidine Other (See Comments)    Reaction not recalled   Tramadol  Nausea Only    Past Medical History, Surgical history, Social history, and Family History were reviewed and updated.  Review of Systems: All other 10 point review of systems is negative.   Physical Exam:  vitals were not taken for this visit.   Wt Readings from Last 3 Encounters:  04/30/24 168 lb 6.4 oz (76.4 kg)  03/31/24 165 lb 12.8 oz (75.2 kg)  03/15/24 167 lb (75.8 kg)    Ocular: Sclerae unicteric, pupils equal, round and reactive to light Ear-nose-throat: Oropharynx clear, dentition fair Lymphatic: No cervical or supraclavicular adenopathy Lungs no rales or rhonchi, good excursion bilaterally Heart regular rate and rhythm, no murmur appreciated Abd soft, nontender, positive bowel sounds MSK no focal spinal tenderness, no joint edema Neuro: non-focal, well-oriented, appropriate affect Breasts: Deferred   Lab Results  Component Value Date   WBC 2.5 (L) 04/30/2024   HGB 10.3 (L)  04/30/2024   HCT 31.9 (L) 04/30/2024   MCV 84.6 04/30/2024   PLT 184 04/30/2024   Lab Results  Component Value Date   FERRITIN 1,184 (H) 04/30/2024   IRON  27 (L) 04/30/2024   TIBC 248 (L) 04/30/2024   UIBC 221 04/30/2024   IRONPCTSAT 11 04/30/2024   Lab Results  Component Value Date   RETICCTPCT 1.4 04/30/2024   RBC 3.77 (L) 04/30/2024   RBC 3.82 (L) 04/30/2024   No results found for: KPAFRELGTCHN, LAMBDASER, KAPLAMBRATIO No results found for: IGGSERUM, IGA, IGMSERUM No results found for: STEPHANY CARLOTA BENSON MARKEL EARLA JOANNIE DOC VICK, SPEI   Chemistry      Component Value Date/Time   NA 135 04/30/2024 1308   NA 138 03/10/2020 1138   K 4.5 04/30/2024 1308   CL 99 04/30/2024 1308   CO2 29 04/30/2024 1308   BUN 15 04/30/2024 1308   BUN 10 03/10/2020 1138   CREATININE 0.68 04/30/2024 1308      Component Value Date/Time   CALCIUM  10.1 04/30/2024 1308   ALKPHOS 90 04/30/2024 1308   AST 16 04/30/2024 1308   ALT 11 04/30/2024 1308   BILITOT 0.3 04/30/2024 1308       Impression and Plan: Ms. Haverstick is a very pleasant 78 yo African American female with low-grade MDS, low IPSS-R score.  Iron  given today as planned dose 2/3.  No Zarxio  and ESA given this visit.  We will see her again in 1 month.   Lauraine Pepper, NP 8/4/20251:13 PM

## 2024-06-02 NOTE — Telephone Encounter (Signed)
 Wadie from [facility] ER lab called with a critical ANC of 0.2. Results given to Lauraine Pepper, NP.

## 2024-06-02 NOTE — Patient Instructions (Signed)

## 2024-06-03 NOTE — Progress Notes (Signed)
 No Zarxio  today per Lauraine. Pt is doing well.  Bridgett Leach Hardy, [facility] 06/02/2024  3:31 PM

## 2024-06-09 ENCOUNTER — Inpatient Hospital Stay

## 2024-06-09 VITALS — BP 135/52 | HR 78 | Temp 97.9°F | Resp 18

## 2024-06-09 DIAGNOSIS — D72819 Decreased white blood cell count, unspecified: Secondary | ICD-10-CM

## 2024-06-09 DIAGNOSIS — D500 Iron deficiency anemia secondary to blood loss (chronic): Secondary | ICD-10-CM

## 2024-06-09 DIAGNOSIS — D4620 Refractory anemia with excess of blasts, unspecified: Secondary | ICD-10-CM | POA: Diagnosis not present

## 2024-06-09 DIAGNOSIS — D46Z Other myelodysplastic syndromes: Secondary | ICD-10-CM

## 2024-06-09 MED ORDER — IRON SUCROSE 20 MG/ML IV SOLN
200.0000 mg | Freq: Once | INTRAVENOUS | Status: AC
  Administered 2024-06-09 (×2): 200 mg via INTRAVENOUS
  Filled 2024-06-09: qty 10

## 2024-06-09 MED ORDER — SODIUM CHLORIDE 0.9 % IV SOLN: INTRAVENOUS | Status: DC

## 2024-06-09 NOTE — Patient Instructions (Signed)

## 2024-06-18 ENCOUNTER — Ambulatory Visit: Admitting: Podiatry

## 2024-06-23 ENCOUNTER — Ambulatory Visit (INDEPENDENT_AMBULATORY_CARE_PROVIDER_SITE_OTHER): Admitting: Podiatry

## 2024-06-23 ENCOUNTER — Other Ambulatory Visit: Payer: Self-pay | Admitting: Physician Assistant

## 2024-06-23 ENCOUNTER — Encounter: Payer: Self-pay | Admitting: Podiatry

## 2024-06-23 DIAGNOSIS — M79674 Pain in right toe(s): Secondary | ICD-10-CM | POA: Diagnosis not present

## 2024-06-23 DIAGNOSIS — M170 Bilateral primary osteoarthritis of knee: Secondary | ICD-10-CM | POA: Insufficient documentation

## 2024-06-23 DIAGNOSIS — M79675 Pain in left toe(s): Secondary | ICD-10-CM | POA: Diagnosis not present

## 2024-06-23 DIAGNOSIS — B351 Tinea unguium: Secondary | ICD-10-CM

## 2024-06-23 DIAGNOSIS — E1169 Type 2 diabetes mellitus with other specified complication: Secondary | ICD-10-CM | POA: Insufficient documentation

## 2024-06-23 NOTE — Progress Notes (Signed)
 Chief Complaint  Patient presents with   Debridement    Trim toenails - diabetic - 8.9    SUBJECTIVE Patient presents to office today complaining of elongated, thickened nails that cause pain while ambulating in shoes.  Patient is unable to trim their own nails. Patient is here for further evaluation and treatment.  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   Nephrolithiasis    Dr Janit, [facility], Hx of [3][   Polyp of colon    Radiculopathy     Allergies  Allergen Reactions   Codeine Hives and Other (See Comments)    Because of a history of documented adverse serious drug reaction, Medi Alert bracelet  is recommended   Macrodantin  [Nitrofurantoin  Macrocrystal] Other (See Comments)    Numbness, aching all over   Other Shortness Of Breath, Rash and Other (See Comments)    Pine nuts & walnuts throat congestion. No documented angioedema.    Propoxyphene Hives, Swelling, Rash and Other (See Comments)   Propoxyphene Hcl Hives, Swelling and Rash   Augmentin  [Amoxicillin -Pot Clavulanate] Itching   Dulaglutide Other (See Comments)    Other  reaction(s): weak, ha, no appetite   Latex Rash and Other (See Comments)   Metformin  Hcl Other (See Comments)    Other reaction(s): leg discomfort   Neosporin [Bacitracin-Polymyxin B] Other (See Comments)    Blisters   Zoster Vac Recomb Adjuvanted Rash and Other (See Comments)    Other reaction(s): confusion   Avocado Rash   Banana Rash    Pine nuts   Ciprofloxacin  Nausea Only and Other (See Comments)    Nausea (Note: Patient takes this when on mission trips, however)   Tizanidine Other (See Comments)    Reaction not recalled   Tramadol  Nausea Only     OBJECTIVE General Patient is awake, alert, and oriented x 3 and in no acute distress. Derm Skin is dry and supple bilateral. Negative open lesions or macerations. Remaining integument unremarkable. Nails are tender, long, thickened and dystrophic with subungual debris, consistent with onychomycosis, 1-5 bilateral. No signs of infection noted. Vasc  DP and PT pedal pulses palpable bilaterally. Temperature gradient within normal limits.  Neuro Epicritic and protective threshold sensation grossly intact bilaterally.  Musculoskeletal Exam No symptomatic pedal deformities noted bilateral. Muscular strength within normal limits.  ASSESSMENT 1.  Pain due to onychomycosis of toenails both  PLAN OF CARE 1. Patient evaluated today.  2. Instructed to maintain good pedal hygiene and foot care.  3. Mechanical debridement of nails 1-5 bilaterally performed  using a nail nipper. Filed with dremel without incident.  4. Return to clinic in 3 mos.    Thresa EMERSON Sar, DPM [facility]  Dr. Thresa EMERSON Sar, DPM    [address] [place], [place] [address]                Office [phone]  Fax [phone]

## 2024-06-25 ENCOUNTER — Other Ambulatory Visit: Payer: Self-pay

## 2024-06-25 ENCOUNTER — Ambulatory Visit: Attending: Physician Assistant | Admitting: Physician Assistant

## 2024-06-25 ENCOUNTER — Encounter: Payer: Self-pay | Admitting: Physician Assistant

## 2024-06-25 VITALS — BP 140/80 | HR 68 | Ht 65.0 in | Wt 167.6 lb

## 2024-06-25 DIAGNOSIS — I340 Nonrheumatic mitral (valve) insufficiency: Secondary | ICD-10-CM | POA: Diagnosis not present

## 2024-06-25 DIAGNOSIS — I4892 Unspecified atrial flutter: Secondary | ICD-10-CM

## 2024-06-25 DIAGNOSIS — R072 Precordial pain: Secondary | ICD-10-CM | POA: Diagnosis not present

## 2024-06-25 DIAGNOSIS — I10 Essential (primary) hypertension: Secondary | ICD-10-CM

## 2024-06-25 DIAGNOSIS — I2510 Atherosclerotic heart disease of native coronary artery without angina pectoris: Secondary | ICD-10-CM | POA: Diagnosis not present

## 2024-06-25 DIAGNOSIS — E7800 Pure hypercholesterolemia, unspecified: Secondary | ICD-10-CM

## 2024-06-25 MED ORDER — METOPROLOL TARTRATE 50 MG PO TABS: 75.0000 mg | ORAL_TABLET | Freq: Two times a day (BID) | ORAL | 3 refills | Status: AC

## 2024-06-25 MED ORDER — METOPROLOL TARTRATE 50 MG PO TABS: 75.0000 mg | ORAL_TABLET | Freq: Two times a day (BID) | ORAL | 3 refills | Status: DC

## 2024-06-25 MED ORDER — METOPROLOL TARTRATE 50 MG PO TABS: 75.0000 mg | ORAL_TABLET | Freq: Two times a day (BID) | ORAL | 0 refills | Status: DC

## 2024-06-25 NOTE — Assessment & Plan Note (Signed)
 Maintaining sinus rhythm.  She is tolerating anticoagulation.  Recent hemoglobin stable, creatinine normal.  Based upon age, weight and creatinine, she should remain on 5 mg of Eliquis . -Continue metoprolol  tartrate 75 mg twice daily, verapamil  180 mg daily -Continue Eliquis  5 mg twice daily

## 2024-06-25 NOTE — Assessment & Plan Note (Signed)
 Blood pressure somewhat elevated. I have asked her to monitor this and let us  know if running above target.  -Continue metoprolol  tartrate 75 mg twice daily, verapamil  180 mg once daily

## 2024-06-25 NOTE — Assessment & Plan Note (Addendum)
 She has had some musculoskeletal chest discomfort.  She has also had some exertional chest discomfort associated shortness of breath that sounds possibly cardiac.  She had a coronary CTA in 2022 with minimal nonobstructive plaque.  She is a diabetic with recent poor control.  She certainly is at risk for progressive CAD. -Arrange pharmacologic MPI -Continue nitroglycerin  as needed, Crestor  5 mg daily, metoprolol  tartrate 75 mg twice daily -Follow-up 6 months

## 2024-06-25 NOTE — Progress Notes (Signed)
 OFFICE NOTE:    Date:  06/25/2024  ID:  Kristen Murray, DOB Aug 26, 1946, MRN [id number] PCP: Dwight Trula SQUIBB, MD  [facility] Providers Cardiologist:  Aleene Passe, MD (Inactive) Cardiology APP:  Lelon Glendia DASEN, PA-C        Atrial fibrillation/flutter CHA2DS2-VASc=4 (female, HTN, Diab, age x 1)  Admitted 12/2019; Apixaban  started; conv to NSR w/ Dilt Mitral regurgitation  TTE 01/28/20: EF 60-65, no RWMA, moderate LVH, normal RV SF, trivial MR, trivial AI  TTE 11/09/22: EF 60-65, no RWMA, GR 2 DD, normal PASP (RVSP 35.7), mild LAE, severe RAE, mild to moderate MR, AV sclerosis, RAP  TTE 11/08/23: EF 60-65, no RWMA, GR 2 DD, normal RVSF, mild LAE, mild MR, moderate MAC, AV sclerosis, RAP 3  Coronary artery disease  Myoview  01/30/20: EF 76, no ischemia or infarction, low risk  CCTA 01/25/21: non-obs dz; mRCA 0-24, CAC score 1 (38%) Diabetes mellitus Hyperlipidemia (managed by PCP) Hypertension Aortic atherosclerosis  PVCs Monitor 01/2021: NSR, occ PVCs, no sig arrhythmias  GERD Iron  deficiency anemia Myelodysplasia  Migraine HAs Verapamil ; followed by Neuro at [facility]) OSA       Discussed the use of AI scribe software for clinical note transcription with the patient, who gave verbal consent to proceed. History of Present Illness Kristen Murray is a 78 y.o. female who returns for follow up of AFlutter, CAD, MR. She was last seen by Dr. Passe in 03/2023.   She a couple of episodes of chest discomfort, described as soreness when leaning forward.  She has also noted light chest pressure with shortness of breath during exertion. This was particularly noted during a recent vacation to [facility], where she felt chest discomfort while walking, characterized by light pressure and shortness of breath, without radiation to the arms or back. No chest pain, pressure, tightness, or heaviness today. She wore compression socks during the trip and reports no chest symptoms before the trip.  Her ankles were swollen but have since improved.   ROS-See HPI    Studies Reviewed:  EKG Interpretation Date/Time:  Wednesday June 25 2024 14:19:06 EDT Ventricular Rate:  65 PR Interval:  166 QRS Duration:  72 QT Interval:  408 QTC Calculation: 424 R Axis:   53  Text Interpretation: Normal sinus rhythm Normal ECG No significant change since last tracing Confirmed by Lelon Glendia [phone]) on 06/25/2024 2:23:33 PM   Labs-chart review 11/26/2023: TSH 3.65 02/14/2024: Total cholesterol 145, HDL 45, LDL 82, triglycerides 92 06/02/2024: Hemoglobin 11.5, creatinine 0.57, potassium 4.6, ALT 20  Risk Assessment/Calculations:  CHA2DS2-VASc Score = 6   This indicates a 9.7% annual risk of stroke. The patient's score is based upon: CHF History: 0 HTN History: 1 Diabetes History: 1 Stroke History: 0 Vascular Disease History: 1 Age Score: 2 Gender Score: 1    HYPERTENSION CONTROL Vitals:   06/25/24 1318 06/25/24 1407  BP: (!) 170/70 (!) 140/80    The patient's blood pressure is elevated above target today.  In order to address the patient's elevated BP: Blood pressure will be monitored at home to determine if medication changes need to be made.         Physical Exam:  VS:  BP (!) 140/80   Pulse 68   Ht 5' 5 (1.651 m)   Wt 167 lb 9.6 oz (76 kg)   LMP 07/30/1990   SpO2 97%   BMI 27.89 kg/m        Wt Readings from  Last 3 Encounters:  06/25/24 167 lb 9.6 oz (76 kg)  06/02/24 168 lb 1.9 oz (76.3 kg)  04/30/24 168 lb 6.4 oz (76.4 kg)    Constitutional:      Appearance: Healthy appearance. Not in distress.  Pulmonary:     Breath sounds: Normal breath sounds. No wheezing. No rales.  Cardiovascular:     Normal rate. Regular rhythm.     Murmurs: There is no murmur.  Edema:    Peripheral edema present.    Ankle: trace edema of the left ankle. Abdominal:     Palpations: Abdomen is soft.       Assessment and Plan:    Assessment & Plan Precordial chest pain Coronary  artery disease involving native coronary artery of native heart without angina pectoris She has had some musculoskeletal chest discomfort.  She has also had some exertional chest discomfort associated shortness of breath that sounds possibly cardiac.  She had a coronary CTA in 2022 with minimal nonobstructive plaque.  She is a diabetic with recent poor control.  She certainly is at risk for progressive CAD. -Arrange pharmacologic MPI -Continue nitroglycerin  as needed, Crestor  5 mg daily, metoprolol  tartrate 75 mg twice daily -Follow-up 6 months Atrial flutter, paroxysmal (HCC) Maintaining sinus rhythm.  She is tolerating anticoagulation.  Recent hemoglobin stable, creatinine normal.  Based upon age, weight and creatinine, she should remain on 5 mg of Eliquis . -Continue metoprolol  tartrate 75 mg twice daily, verapamil  180 mg daily -Continue Eliquis  5 mg twice daily Nonrheumatic mitral valve regurgitation Mild MR by Echocardiogram in 10/2023.  Essential hypertension Blood pressure somewhat elevated. I have asked her to monitor this and let us  know if running above target.  -Continue metoprolol  tartrate 75 mg twice daily, verapamil  180 mg once daily  Pure hypercholesterolemia LDL optimal. Continue Crestor  5 mg once daily      Informed Consent   Shared Decision Making/Informed Consent The risks [chest pain, shortness of breath, cardiac arrhythmias, dizziness, blood pressure fluctuations, myocardial infarction, stroke/transient ischemic attack, nausea, vomiting, allergic reaction, radiation exposure, metallic taste sensation and life-threatening complications (estimated to be 1 in 10,000)], benefits (risk stratification, diagnosing coronary artery disease, treatment guidance) and alternatives of a nuclear stress test were discussed in detail with Kristen Murray and she agrees to proceed.     Dispo:  Return in about 6 months (around 12/26/2024) for Routine Follow Up, w/ Glendia Ferrier, PA-C.  Signed, Glendia Ferrier, PA-C

## 2024-06-25 NOTE — Assessment & Plan Note (Addendum)
 Mild MR by Echocardiogram in 10/2023.

## 2024-06-25 NOTE — Patient Instructions (Addendum)
 Medication Instructions:  Your physician recommends that you continue on your current medications as directed. Please refer to the Current Medication list given to you today. *If you need a refill on your cardiac medications before your next appointment, please call your pharmacy*  Lab Work: None ordered If you have labs (blood work) drawn today and your tests are completely normal, you will receive your results only by: MyChart Message (if you have MyChart) OR A paper copy in the mail If you have any lab test that is abnormal or we need to change your treatment, we will call you to review the results.  Testing/Procedures: Your physician has requested that you have a lexiscan  myoview . For further information please visit [email or web address] Please follow instruction sheet, as given.   Follow-Up: At [facility], you and your health needs are our priority.  As part of our continuing mission to provide you with exceptional heart care, our providers are all part of one team.  This team includes your primary Cardiologist (physician) and Advanced Practice Providers or APPs (Physician Assistants and Nurse Practitioners) who all work together to provide you with the care you need, when you need it.  Your next appointment:   6 month(s)  Provider:   Glendia Ferrier, PA-C        We recommend signing up for the patient portal called MyChart.  Sign up information is provided on this After Visit Summary.  MyChart is used to connect with patients for Virtual Visits (Telemedicine).  Patients are able to view lab/test results, encounter notes, upcoming appointments, etc.  Non-urgent messages can be sent to your provider as well.   To learn more about what you can do with MyChart, go to [email or web address].   Other Instructions CONTINUE TO MONITOR YOUR BLOOD PRESSURE CONTACT THE OFFICE IF YOUR BLOOD PRESSURE CONSISTENTLY RUNS OVER 140/90

## 2024-06-25 NOTE — Assessment & Plan Note (Signed)
 LDL optimal. Continue Crestor  5 mg once daily

## 2024-07-04 ENCOUNTER — Inpatient Hospital Stay: Admitting: Family

## 2024-07-04 ENCOUNTER — Encounter (HOSPITAL_COMMUNITY): Payer: Self-pay | Admitting: *Deleted

## 2024-07-04 ENCOUNTER — Inpatient Hospital Stay: Attending: Hematology & Oncology

## 2024-07-04 ENCOUNTER — Inpatient Hospital Stay

## 2024-07-04 ENCOUNTER — Encounter: Payer: Self-pay | Admitting: Obstetrics and Gynecology

## 2024-07-04 VITALS — BP 152/60 | HR 75 | Temp 98.3°F | Resp 17 | Wt 168.1 lb

## 2024-07-04 DIAGNOSIS — D500 Iron deficiency anemia secondary to blood loss (chronic): Secondary | ICD-10-CM

## 2024-07-04 DIAGNOSIS — D46Z Other myelodysplastic syndromes: Secondary | ICD-10-CM

## 2024-07-04 DIAGNOSIS — D72819 Decreased white blood cell count, unspecified: Secondary | ICD-10-CM | POA: Diagnosis not present

## 2024-07-04 DIAGNOSIS — D4620 Refractory anemia with excess of blasts, unspecified: Secondary | ICD-10-CM | POA: Insufficient documentation

## 2024-07-04 DIAGNOSIS — D508 Other iron deficiency anemias: Secondary | ICD-10-CM | POA: Insufficient documentation

## 2024-07-04 DIAGNOSIS — K909 Intestinal malabsorption, unspecified: Secondary | ICD-10-CM | POA: Insufficient documentation

## 2024-07-04 DIAGNOSIS — Z79899 Other long term (current) drug therapy: Secondary | ICD-10-CM | POA: Diagnosis not present

## 2024-07-04 LAB — IRON AND IRON BINDING CAPACITY (CC-WL,HP ONLY)
Iron: 29 ug/dL (ref 28–170)
Saturation Ratios: 12 % (ref 10.4–31.8)
TIBC: 238 ug/dL — ABNORMAL LOW (ref 250–450)
UIBC: 209 ug/dL

## 2024-07-04 LAB — CBC WITH DIFFERENTIAL (CANCER CENTER ONLY)
Abs Immature Granulocytes: 0.11 K/uL — ABNORMAL HIGH (ref 0.00–0.07)
Basophils Absolute: 0 K/uL (ref 0.0–0.1)
Basophils Relative: 0 %
Eosinophils Absolute: 0 K/uL (ref 0.0–0.5)
Eosinophils Relative: 0 %
HCT: 35.9 % — ABNORMAL LOW (ref 36.0–46.0)
Hemoglobin: 11.5 g/dL — ABNORMAL LOW (ref 12.0–15.0)
Immature Granulocytes: 5 %
Lymphocytes Relative: 75 %
Lymphs Abs: 1.7 K/uL (ref 0.7–4.0)
MCH: 26.8 pg (ref 26.0–34.0)
MCHC: 32 g/dL (ref 30.0–36.0)
MCV: 83.7 fL (ref 80.0–100.0)
Monocytes Absolute: 0.3 K/uL (ref 0.1–1.0)
Monocytes Relative: 12 %
Neutro Abs: 0.2 K/uL — CL (ref 1.7–7.7)
Neutrophils Relative %: 8 %
Platelet Count: 169 K/uL (ref 150–400)
RBC: 4.29 MIL/uL (ref 3.87–5.11)
RDW: 18.2 % — ABNORMAL HIGH (ref 11.5–15.5)
Smear Review: NORMAL
WBC Count: 2.3 K/uL — ABNORMAL LOW (ref 4.0–10.5)
nRBC: 0 % (ref 0.0–0.2)

## 2024-07-04 LAB — CMP (CANCER CENTER ONLY)
ALT: 19 U/L (ref 0–44)
AST: 25 U/L (ref 15–41)
Albumin: 4.1 g/dL (ref 3.5–5.0)
Alkaline Phosphatase: 103 U/L (ref 38–126)
Anion gap: 10 (ref 5–15)
BUN: 15 mg/dL (ref 8–23)
CO2: 26 mmol/L (ref 22–32)
Calcium: 9.8 mg/dL (ref 8.9–10.3)
Chloride: 98 mmol/L (ref 98–111)
Creatinine: 0.67 mg/dL (ref 0.44–1.00)
GFR, Estimated: >60 mL/min (ref >60)
Glucose, Bld: 298 mg/dL — ABNORMAL HIGH (ref 70–99)
Potassium: 4.2 mmol/L (ref 3.5–5.1)
Sodium: 135 mmol/L (ref 135–145)
Total Bilirubin: 0.3 mg/dL (ref 0.0–1.2)
Total Protein: 7.9 g/dL (ref 6.5–8.1)

## 2024-07-04 LAB — RETICULOCYTES
Immature Retic Fract: 13.3 % (ref 2.3–15.9)
RBC.: 4.29 MIL/uL (ref 3.87–5.11)
Retic Count, Absolute: 62.6 K/uL (ref 19.0–186.0)
Retic Ct Pct: 1.5 % (ref 0.4–3.1)

## 2024-07-04 LAB — FERRITIN: Ferritin: 1890 ng/mL — ABNORMAL HIGH (ref 11–307)

## 2024-07-04 NOTE — Progress Notes (Signed)
 Hematology and Oncology Follow Up Visit  Kristen Murray [id number] 08-Apr-1946 78 y.o. 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 She is doing fairly well but notes persistent fatigue. 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 She states that she has been putting off having knee replacement surgery for years and has an appointment with ortho next week.  No falls or syncope reported.  No swelling in her extremities.  No fever, chills, n/v, cough, rash, dizziness, SOB, chest pain, palpitations, abdominal pain or changes in bowel or bladder habits.  Appetite and hydration are good. Weight is stable at 168 lbs.   ECOG Performance Status: 1 - Symptomatic but completely ambulatory  Medications:  Allergies as of 07/04/2024       Reactions   Codeine Hives, Other (See Comments)   Because of a history of documented adverse serious drug reaction, Medi Alert bracelet  is recommended   Macrodantin  [nitrofurantoin  Macrocrystal] Other (See Comments)   Numbness, aching all over   Other Shortness Of Breath, Rash, Other (See Comments)   Pine nuts & walnuts throat congestion. No documented angioedema.   Propoxyphene Hives, Swelling, Rash, Other (See Comments)   Propoxyphene Hcl Hives, Swelling, Rash   Augmentin  [amoxicillin -pot Clavulanate] Itching   Dulaglutide Other (See Comments)   Other reaction(s): weak, ha, no appetite   Latex Rash, Other (See Comments)   Metformin  Hcl Other (See Comments)   Other reaction(s): leg discomfort   Neosporin [bacitracin-polymyxin B] Other (See Comments)   Blisters   Zoster Vac Recomb Adjuvanted Rash, Other (See Comments)   Other reaction(s):  confusion   Avocado Rash   Banana Rash   Pine nuts   Ciprofloxacin  Nausea Only, Other (See Comments)   Nausea (Note: Patient takes this when on mission trips, however)   Tizanidine Other (See Comments)   Reaction not recalled   Tramadol  Nausea Only        Medication List        Accurate as of July 04, 2024 12:35 PM. If you have any questions, ask your nurse or doctor.          albuterol  108 (90 Base) MCG/ACT inhaler Commonly known as: VENTOLIN  HFA Inhale 2 puffs into the lungs every 4 (four) hours as needed.   apixaban  5 MG Tabs tablet Commonly known as: Eliquis  Take 1 tablet (5 mg total) by mouth 2 (two) times daily.   ASCORBIC ACID PO Take 1 tablet by mouth daily. Dosage unknown   azelastine 0.1 % nasal spray Commonly known as: ASTELIN Place into both nostrils 2 (two) times daily. Use in each nostril as directed   b complex vitamins capsule Take 1 capsule by mouth daily.   cetirizine  10 MG tablet Commonly known as: ZYRTEC  Take 1 tablet (10 mg total) by mouth daily as needed for allergies (Can take an extra dose during flare ups.).   Cholecalciferol 25 MCG (1000 UT) capsule Take 1,000 Units by mouth daily.   cycloSPORINE 0.05 % ophthalmic emulsion Commonly known as: RESTASIS Place 1 drop into both eyes every Monday, Wednesday, and Friday.   dextromethorphan -guaiFENesin  30-600 MG 12hr tablet Commonly  known as: MUCINEX  DM Take 1 tablet by mouth 2 (two) times daily.   DropSafe Alcohol Prep 70 % Pads Apply topically.   EPINEPHrine  0.3 mg/0.3 mL Soaj injection Commonly known as: EpiPen  2-Pak Inject 0.3 mg into the muscle as needed for anaphylaxis.   estradiol 0.1 MG/GM vaginal cream Commonly known as: ESTRACE Place 1 Applicatorful vaginally once a week. 1-2 weekly   fluorometholone 0.1 % ophthalmic suspension Commonly known as: FML Place 1 drop into both eyes every Monday, Wednesday, and Friday.   fluticasone  50 MCG/ACT nasal spray Commonly  known as: FLONASE  Place 1 spray into both nostrils 2 (two) times daily.   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   glipiZIDE  10 MG 24 hr tablet Commonly known as: GLUCOTROL  XL Take 20 mg by mouth daily.   glucose blood test strip Inject 1 strip into the skin daily.   glucose blood test strip Commonly known as: FREESTYLE LITE CHECK BLOOD SUGAR ONCE DAILY AS DIRECTED.  [id number]   Lantus  SoloStar 100 UNIT/ML Solostar Pen Generic drug: insulin  glargine Inject 10 Units into the skin daily.   levothyroxine  25 MCG tablet Commonly known as: SYNTHROID  Take 25 mcg by mouth daily.   lidocaine  5 % ointment Commonly known as: XYLOCAINE  Apply 1 application topically 3 (three) times daily as needed.   metoprolol  tartrate 50 MG tablet Commonly known as: LOPRESSOR  Take 1.5 tablets (75 mg total) by mouth 2 (two) times daily.   montelukast  10 MG tablet Commonly known as: SINGULAIR  Take 1 tablet (10 mg total) by mouth at bedtime.   multivitamin tablet Take 1 tablet by mouth daily. Shaklee brand   nitroGLYCERIN  0.4 MG SL tablet Commonly known as: NITROSTAT  Place 1 tablet (0.4 mg total) under the tongue every 5 (five) minutes as needed for chest pain.   omeprazole 20 MG capsule Commonly known as: PRILOSEC Take 20 mg by mouth daily.   rosuvastatin  5 MG tablet Commonly known as: CRESTOR  Take 1 tablet (5 mg total) by mouth daily.   trolamine salicylate 10 % cream Commonly known as: ASPERCREME Apply 1 application topically as needed for muscle pain.   valACYclovir  500 MG tablet Commonly known as: VALTREX  Take 1 tablet (500 mg total) by mouth 2 (two) times daily. Take twice a day for thee days as needed for an outbreak.   verapamil  180 MG CR tablet Commonly known as: CALAN -SR Take 180 mg by mouth at bedtime.        Allergies:  Allergies   Allergen Reactions   Codeine Hives and Other (See Comments)    Because of a history of documented adverse serious drug reaction, Medi Alert bracelet  is recommended   Macrodantin  [Nitrofurantoin  Macrocrystal] Other (See Comments)    Numbness, aching all over   Other Shortness Of Breath, Rash and Other (See Comments)    Pine nuts & walnuts throat congestion. No documented angioedema.    Propoxyphene Hives, Swelling, Rash and Other (See Comments)   Propoxyphene Hcl Hives, Swelling and Rash   Augmentin  [Amoxicillin -Pot Clavulanate] Itching   Dulaglutide Other (See Comments)    Other reaction(s): weak, ha, no appetite   Latex Rash and Other (See Comments)   Metformin  Hcl Other (See Comments)    Other reaction(s): leg discomfort   Neosporin [Bacitracin-Polymyxin B] Other (See Comments)    Blisters   Zoster Vac Recomb Adjuvanted Rash and  Other (See Comments)    Other reaction(s): confusion   Avocado Rash   Banana Rash    Pine nuts   Ciprofloxacin  Nausea Only and Other (See Comments)    Nausea (Note: Patient takes this when on mission trips, however)   Tizanidine Other (See Comments)    Reaction not recalled   Tramadol  Nausea Only    Past Medical History, Surgical history, Social history, and Family History were reviewed and updated.  Review of Systems: All other 10 point review of systems is negative.   Physical Exam:  vitals were not taken for this visit.   Wt Readings from Last 3 Encounters:  06/25/24 167 lb 9.6 oz (76 kg)  06/02/24 168 lb 1.9 oz (76.3 kg)  04/30/24 168 lb 6.4 oz (76.4 kg)    Ocular: Sclerae unicteric, pupils equal, round and reactive to light Ear-nose-throat: Oropharynx clear, dentition fair Lymphatic: No cervical or supraclavicular adenopathy Lungs no rales or rhonchi, good excursion bilaterally Heart regular rate and rhythm, no murmur appreciated Abd soft, nontender, positive bowel sounds MSK no focal spinal tenderness, no joint edema Neuro:  non-focal, well-oriented, appropriate affect Breasts: Deferred   Lab Results  Component Value Date   WBC 2.1 (L) 06/02/2024   HGB 11.5 (L) 06/02/2024   HCT 36.3 06/02/2024   MCV 83.6 06/02/2024   PLT 156 06/02/2024   Lab Results  Component Value Date   FERRITIN 1,668 (H) 06/02/2024   IRON  64 06/02/2024   TIBC 218 (L) 06/02/2024   UIBC 154 06/02/2024   IRONPCTSAT 29 06/02/2024   Lab Results  Component Value Date   RETICCTPCT <0.4 (L) 06/02/2024   RBC 4.23 06/02/2024   No results found for: KPAFRELGTCHN, LAMBDASER, KAPLAMBRATIO No results found for: IGGSERUM, IGA, IGMSERUM No results found for: STEPHANY CARLOTA BENSON MARKEL EARLA JOANNIE DOC VICK, SPEI   Chemistry      Component Value Date/Time   NA 136 06/02/2024 1257   NA 138 03/10/2020 1138   K 4.6 06/02/2024 1257   CL 100 06/02/2024 1257   CO2 26 06/02/2024 1257   BUN 10 06/02/2024 1257   BUN 10 03/10/2020 1138   CREATININE 0.57 06/02/2024 1257      Component Value Date/Time   CALCIUM  9.9 06/02/2024 1257   ALKPHOS 105 06/02/2024 1257   AST 23 06/02/2024 1257   ALT 20 06/02/2024 1257   BILITOT <0.2 06/02/2024 1257       Impression and Plan: Ms. Topp is a very pleasant 78 yo African American female with low-grade MDS, low IPSS-R score.  Iron  studies are pending.  No Zarxio  and ESA given this visit.  We will see her again in 1 month.   Lauraine Pepper, NP 9/5/202512:35 PM

## 2024-07-04 NOTE — Progress Notes (Unsigned)
 78 y.o. [id number] Married [place] American female here for a breast and pelvic exam. Has been having some vulvar/perianal itching. Does not know if its from discharge or from leaking urine.   Some urgency.  Wearing pads.   No regular stress incontinence with cough or sneeze.   No caffeine use.    She has a urologist at [facility].  Has chronic interstitial cystitis.   The patient is also followed for HSV.  PCP: Dwight Trula SQUIBB, MD   Patient's last menstrual period was 07/30/1990.           Sexually active: No.  The current method of family planning is post menopausal status.    Menopausal hormone therapy:  Estrace  Exercising: No.   Smoker:  no  OB History     Gravida  3   Para  3   Term  3   Preterm      AB      Living  3      SAB      IAB      Ectopic      Multiple      Live Births  3           HEALTH MAINTENANCE: Last 2 paps: 12/15/19 neg, 11/05/14 neg  History of abnormal Pap or positive HPV:  no Mammogram:  05/13/24 Breast Density Cat B< BIRADS Cat 1 neg  Colonoscopy:  08/09/10 Bone Density:  02/10/16  Result  normal    Immunization History  Administered Date(s) Administered   Fluzone Influenza virus vaccine,trivalent (IIV3), split virus 07/29/2015, 11/17/2021   Hep A / Hep B 04/13/2004, 05/18/2004, 03/06/2005   INFLUENZA, HIGH DOSE SEASONAL PF 08/06/2013, 08/16/2016, 09/12/2017, 08/12/2018, 11/15/2023   Influenza Split 08/06/2012   Influenza,inj,quad, With Preservative 09/01/2014, 07/31/2019   Influenza-Unspecified 08/16/2016, 08/15/2017, 09/12/2017, 08/12/2018, 07/10/2019, 11/14/2021   MMR 03/06/2005   OPV 07/18/1996   PFIZER(Purple Top)SARS-COV-2 Vaccination 06/21/2020, 07/12/2020   Pneumococcal Conjugate-13 09/08/2015   Pneumococcal Polysaccharide-23 12/23/2013, 04/29/2014   Pneumococcal-Unspecified 11/01/2020   Td 02/11/1996   Tdap 09/01/2014   Typhoid Inactivated 04/07/2010   Typhoid Live 02/15/2015   Zoster, Live 11/17/2019       reports that she has never smoked. She has never used smokeless tobacco. She reports that she does not drink alcohol and does not use drugs.  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    HTN (hypertension)    Hyperglycemia    Hyperlipidemia    Interstitial cystitis    Meningioma (HCC)    Mitral regurgitation 11/10/2022   TTE 11/09/2022: EF 60-65, no RWMA, GR 2 DD, normal PASP (RVSP 35.7), mild LAE, severe RAE, mild to moderate MR, AV sclerosis, RAP 3   Myelodysplasia, 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   Nephrolithiasis    Dr Janit, [facility], Hx of [3][   Polyp of colon    Radiculopathy  Past Surgical History:  Procedure Laterality Date   ABDOMINAL HYSTERECTOMY     TAH/BSO --fibroids, no cancer   BALLOON DILATION N/A 07/20/2014   Procedure: BALLOON DILATION;  Surgeon: Gladis MARLA Louder, MD;  Location: [facility];  Service: Endoscopy;  Laterality: N/A;   BREAST BIOPSY     Right   CARPAL TUNNEL RELEASE     & trigger thumb RUE; Dr Van   COLONOSCOPY W/ POLYPECTOMY  07/2004   Neg 2011; Dr Gladis Louder   CYSTOSCOPY  11/2009   Neg   ESOPHAGOGASTRODUODENOSCOPY N/A 07/20/2014   Procedure: ESOPHAGOGASTRODUODENOSCOPY (EGD);  Surgeon: Gladis MARLA Louder, MD;  Location: [facility];  Service: Endoscopy;  Laterality: N/A;   FLEXIBLE BRONCHOSCOPY W/ UPPER ENDOSCOPY     neg   KNEE ARTHROSCOPY     Left   ROTATOR CUFF REPAIR     R  shoulder   TONSILLECTOMY     TRIGGER FINGER RELEASE Right    Right Thumb    Current Outpatient Medications  Medication Sig Dispense Refill   albuterol  (VENTOLIN  HFA) 108 (90 Base) MCG/ACT inhaler Inhale 2 puffs into the lungs every 4 (four) hours as needed.     Alcohol Swabs (DROPSAFE ALCOHOL PREP) 70 % PADS Apply topically.     apixaban  (ELIQUIS ) 5 MG TABS tablet Take 1 tablet (5 mg total) by mouth 2 (two) times daily. 180 tablet 3   ASCORBIC ACID PO Take 1 tablet by mouth daily. Dosage unknown     azelastine (ASTELIN) 0.1 % nasal spray Place into both nostrils 2 (two) times daily. Use in each nostril as directed     b complex vitamins capsule Take 1 capsule by mouth daily.     cetirizine  (ZYRTEC ) 10 MG tablet Take 1 tablet (10 mg total) by mouth daily as needed for allergies (Can take an extra dose during flare ups.). 30 tablet 3   Cholecalciferol 25 MCG (1000 UT) capsule Take 1,000 Units by mouth daily.     cycloSPORINE (RESTASIS) 0.05 % ophthalmic emulsion Place 1 drop into both eyes every Monday, Wednesday, and Friday.     dextromethorphan -guaiFENesin  (MUCINEX  DM) 30-600 MG 12hr tablet Take 1 tablet by mouth 2 (two) times daily. 20 tablet 0   EPINEPHrine  (EPIPEN  2-PAK) 0.3 mg/0.3 mL IJ SOAJ injection Inject 0.3 mg into the muscle as needed for anaphylaxis. 2 each 1   estradiol (ESTRACE) 0.1 MG/GM vaginal cream Place 1 Applicatorful vaginally once a week. 1-2 weekly     fluorometholone (FML) 0.1 % ophthalmic suspension Place 1 drop into both eyes every Monday, Wednesday, and Friday.      fluticasone  (FLONASE ) 50 MCG/ACT nasal spray Place 1 spray into both nostrils 2 (two) times daily. 48 g 1   furosemide (LASIX) 20 MG tablet Take 20 mg by mouth daily.     glipiZIDE  (GLUCOTROL  XL) 10 MG 24 hr tablet Take 20 mg by mouth daily.     glucose blood (FREESTYLE LITE) test strip CHECK BLOOD SUGAR ONCE DAILY AS DIRECTED.  DX:790.29 100 each 12   glucose blood test strip Inject 1 strip into the  skin daily.     Lancets (FREESTYLE) lancets Check blood sugar once daily as directed DX:790.29 (FREESTYLE FREEDOM LITE) 100 each 1   LANTUS  SOLOSTAR 100 UNIT/ML Solostar Pen Inject 10 Units into the skin daily.     levothyroxine  (SYNTHROID ) 25 MCG tablet Take 25 mcg by mouth daily.     lidocaine  (XYLOCAINE ) 5 % ointment Apply 1 application topically 3 (three)  times daily as needed. 1.25 g 0   metoprolol  tartrate (LOPRESSOR ) 50 MG tablet Take 1.5 tablets (75 mg total) by mouth 2 (two) times daily. 270 tablet 3   montelukast  (SINGULAIR ) 10 MG tablet Take 1 tablet (10 mg total) by mouth at bedtime. 90 tablet 1   Multiple Vitamin (MULTIVITAMIN) tablet Take 1 tablet by mouth daily. Shaklee brand     nitroGLYCERIN  (NITROSTAT ) 0.4 MG SL tablet Place 1 tablet (0.4 mg total) under the tongue every 5 (five) minutes as needed for chest pain. 25 tablet 3   omeprazole (PRILOSEC) 20 MG capsule Take 20 mg by mouth daily.     rosuvastatin  (CRESTOR ) 5 MG tablet Take 1 tablet (5 mg total) by mouth daily. 30 tablet 0   trolamine salicylate (ASPERCREME) 10 % cream Apply 1 application topically as needed for muscle pain.     valACYclovir  (VALTREX ) 500 MG tablet Take 1 tablet (500 mg total) by mouth 2 (two) times daily. Take twice a day for thee days as needed for an outbreak. 30 tablet 1   verapamil  (CALAN -SR) 180 MG CR tablet Take 180 mg by mouth at bedtime.     No current facility-administered medications for this visit.   Facility-Administered Medications Ordered in Other Visits  Medication Dose Route Frequency Provider Last Rate Last Admin   filgrastim -sndz (ZARXIO ) injection 300 mcg  300 mcg Subcutaneous Once Ennever, Peter R, MD       filgrastim -sndz (ZARXIO ) injection 480 mcg  480 mcg Subcutaneous Once Franchot Lauraine HERO, NP        ALLERGIES: Codeine, Macrodantin  [nitrofurantoin  macrocrystal], Other, Propoxyphene, Propoxyphene hcl, Augmentin  [amoxicillin -pot clavulanate], Dulaglutide, Latex, Metformin  hcl,  Neosporin [bacitracin-polymyxin b], Zoster vac recomb adjuvanted, Avocado, Banana, Ciprofloxacin , Tizanidine, and Tramadol   Family History  Problem Relation Age of Onset   Alcohol abuse Mother    Other Father        unknown   Diabetes Maternal Aunt    Diabetes Maternal Uncle    Tuberculosis Maternal Uncle    Diabetes Maternal Grandmother    Coronary artery disease Neg Hx     Review of Systems  All other systems reviewed and are negative.   PHYSICAL EXAM:  BP 116/74 (BP Location: Left Arm, Patient Position: Sitting)   Pulse 71   Ht 5' 5.25 (1.657 m)   Wt 168 lb (76.2 kg)   LMP 07/30/1990   SpO2 97%   BMI 27.74 kg/m     General appearance: alert, cooperative and appears stated age Head: normocephalic, without obvious abnormality, atraumatic Neck: no adenopathy, supple, symmetrical, trachea midline and thyroid  normal to inspection and palpation Lungs: clear to auscultation bilaterally Breasts: normal appearance, no masses or tenderness, No nipple retraction or dimpling, No nipple discharge or bleeding, No axillary adenopathy Heart: regular rate and rhythm Abdomen: soft, non-tender; no masses, no organomegaly Extremities: extremities normal, atraumatic, no cyanosis or edema Skin: skin color, texture, turgor normal. No rashes or lesions Lymph nodes: cervical, supraclavicular, and axillary nodes normal. Neurologic: grossly normal  Pelvic: External genitalia:  no lesions              No abnormal inguinal nodes palpated.              Urethra:  normal appearing urethra with no masses, tenderness or lesions              Bartholins and Skenes: normal                 Vagina: normal appearing  vagina with normal color and discharge, no lesions              Cervix: absent              Pap taken: yes Bimanual Exam:  Uterus:  absent              Adnexa: no mass, fullness, tenderness              Rectal exam: yes.  Confirms.              Anus:  normal sphincter tone, no  lesions  Chaperone was present for exam:  Kari HERO, CMA  ASSESSMENT: Encounter for breast and pelvic exam.  Status post TAH/BSO.  Hx chronic vulvitis and HSV.  Biopsy proven spongiotic dermatitis.  Interstitial cystitis.  On Eliquis .   Perianal irritation.  Left shoulder lipoma 4 - 5 cm.   ***  PLAN: Mammogram screening discussed. Self breast awareness reviewed. Pap and HRV collected:  {yes no:[id number]} Guidelines for Calcium , Vitamin D , regular exercise program including cardiovascular and weight bearing exercise. Medication refills:  *** She will have her PCP order bone density test, per patient preference.  {LABS (Optional):23779} Follow up:  ***    Additional counseling given.  {yes [id number]. ***  total time was spent for this patient encounter, including preparation, face-to-face counseling with the patient, coordination of care, and documentation of the encounter in addition to doing the breast and pelvic exam.

## 2024-07-08 ENCOUNTER — Encounter: Payer: Self-pay | Admitting: Obstetrics and Gynecology

## 2024-07-08 ENCOUNTER — Other Ambulatory Visit (HOSPITAL_COMMUNITY)
Admission: RE | Admit: 2024-07-08 | Discharge: 2024-07-08 | Disposition: A | Discharge status: Home / Self Care | Source: Ambulatory Visit | Attending: Obstetrics and Gynecology | Admitting: Obstetrics and Gynecology

## 2024-07-08 ENCOUNTER — Ambulatory Visit (INDEPENDENT_AMBULATORY_CARE_PROVIDER_SITE_OTHER): Admitting: Obstetrics and Gynecology

## 2024-07-08 VITALS — BP 116/74 | HR 71 | Ht 65.25 in | Wt 168.0 lb

## 2024-07-08 DIAGNOSIS — B009 Herpesviral infection, unspecified: Secondary | ICD-10-CM | POA: Diagnosis not present

## 2024-07-08 DIAGNOSIS — Z9289 Personal history of other medical treatment: Secondary | ICD-10-CM | POA: Diagnosis not present

## 2024-07-08 DIAGNOSIS — Z1272 Encounter for screening for malignant neoplasm of vagina: Secondary | ICD-10-CM

## 2024-07-08 DIAGNOSIS — N763 Subacute and chronic vulvitis: Secondary | ICD-10-CM | POA: Diagnosis not present

## 2024-07-08 DIAGNOSIS — R3915 Urgency of urination: Secondary | ICD-10-CM | POA: Diagnosis not present

## 2024-07-08 DIAGNOSIS — Z01419 Encounter for gynecological examination (general) (routine) without abnormal findings: Secondary | ICD-10-CM

## 2024-07-08 DIAGNOSIS — Z1151 Encounter for screening for human papillomavirus (HPV): Secondary | ICD-10-CM | POA: Insufficient documentation

## 2024-07-08 MED ORDER — VALACYCLOVIR HCL 500 MG PO TABS: 500.0000 mg | ORAL_TABLET | Freq: Two times a day (BID) | ORAL | 1 refills | Status: AC

## 2024-07-08 MED ORDER — TRIAMCINOLONE ACETONIDE 0.025 % EX OINT: 1.0000 | TOPICAL_OINTMENT | Freq: Two times a day (BID) | CUTANEOUS | 1 refills | Status: AC

## 2024-07-08 NOTE — Patient Instructions (Signed)

## 2024-07-10 ENCOUNTER — Encounter (HOSPITAL_COMMUNITY)

## 2024-07-10 LAB — CYTOLOGY - PAP
Comment: NEGATIVE
Diagnosis: NEGATIVE
Diagnosis: REACTIVE
High risk HPV: NEGATIVE

## 2024-07-11 ENCOUNTER — Encounter (HOSPITAL_COMMUNITY)

## 2024-07-14 ENCOUNTER — Ambulatory Visit: Payer: Self-pay | Admitting: Obstetrics and Gynecology

## 2024-07-15 ENCOUNTER — Other Ambulatory Visit: Payer: Self-pay | Admitting: Physician Assistant

## 2024-07-15 DIAGNOSIS — R072 Precordial pain: Secondary | ICD-10-CM

## 2024-07-15 DIAGNOSIS — I2510 Atherosclerotic heart disease of native coronary artery without angina pectoris: Secondary | ICD-10-CM

## 2024-07-18 ENCOUNTER — Ambulatory Visit (HOSPITAL_COMMUNITY)
Admission: RE | Admit: 2024-07-18 | Discharge: 2024-07-18 | Disposition: A | Discharge status: Home / Self Care | Source: Ambulatory Visit | Attending: Physician Assistant | Admitting: Physician Assistant

## 2024-07-18 DIAGNOSIS — I2510 Atherosclerotic heart disease of native coronary artery without angina pectoris: Secondary | ICD-10-CM | POA: Insufficient documentation

## 2024-07-18 DIAGNOSIS — R072 Precordial pain: Secondary | ICD-10-CM | POA: Insufficient documentation

## 2024-07-18 LAB — MYOCARDIAL PERFUSION IMAGING
LV dias vol: 57 mL (ref 46–106)
LV sys vol: 11 mL (ref 3.8–5.2)
Nuc Stress EF: 81 %
Peak HR: 85 {beats}/min
Rest HR: 67 {beats}/min
Rest Nuclear Isotope Dose: 10.6 mCi
SDS: 0
SRS: 0
SSS: 0
ST Depression (mm): 0 mm
Stress Nuclear Isotope Dose: 31.7 mCi
TID: 0.94

## 2024-07-18 MED ORDER — TECHNETIUM TC 99M TETROFOSMIN IV KIT
10.6000 | PACK | Freq: Once | INTRAVENOUS | Status: AC | PRN
  Administered 2024-07-18: 10.6 via INTRAVENOUS

## 2024-07-18 MED ORDER — REGADENOSON 0.4 MG/5ML IV SOLN
INTRAVENOUS | Status: AC
Start: 2024-07-18 — End: 2024-07-18
  Filled 2024-07-18: qty 5

## 2024-07-18 MED ORDER — REGADENOSON 0.4 MG/5ML IV SOLN
0.4000 mg | Freq: Once | INTRAVENOUS | Status: AC
  Administered 2024-07-18: 0.4 mg via INTRAVENOUS

## 2024-07-18 MED ORDER — TECHNETIUM TC 99M TETROFOSMIN IV KIT
31.7000 | PACK | Freq: Once | INTRAVENOUS | Status: AC | PRN
  Administered 2024-07-18: 31.7 via INTRAVENOUS

## 2024-07-20 ENCOUNTER — Ambulatory Visit: Payer: Self-pay | Admitting: Physician Assistant

## 2024-08-04 ENCOUNTER — Inpatient Hospital Stay: Admitting: Family

## 2024-08-04 ENCOUNTER — Inpatient Hospital Stay: Attending: Hematology & Oncology

## 2024-08-04 ENCOUNTER — Inpatient Hospital Stay

## 2024-08-04 ENCOUNTER — Telehealth: Payer: Self-pay | Admitting: *Deleted

## 2024-08-04 VITALS — BP 128/52 | HR 71 | Temp 98.6°F | Resp 19 | Ht 65.0 in | Wt 169.5 lb

## 2024-08-04 DIAGNOSIS — Z79899 Other long term (current) drug therapy: Secondary | ICD-10-CM | POA: Insufficient documentation

## 2024-08-04 DIAGNOSIS — D500 Iron deficiency anemia secondary to blood loss (chronic): Secondary | ICD-10-CM

## 2024-08-04 DIAGNOSIS — D46Z Other myelodysplastic syndromes: Secondary | ICD-10-CM | POA: Insufficient documentation

## 2024-08-04 DIAGNOSIS — D72819 Decreased white blood cell count, unspecified: Secondary | ICD-10-CM

## 2024-08-04 LAB — CMP (CANCER CENTER ONLY)
ALT: 14 U/L (ref 0–44)
AST: 20 U/L (ref 15–41)
Albumin: 3.9 g/dL (ref 3.5–5.0)
Alkaline Phosphatase: 104 U/L (ref 38–126)
Anion gap: 10 (ref 5–15)
BUN: 10 mg/dL (ref 8–23)
CO2: 26 mmol/L (ref 22–32)
Calcium: 9.6 mg/dL (ref 8.9–10.3)
Chloride: 101 mmol/L (ref 98–111)
Creatinine: 0.61 mg/dL (ref 0.44–1.00)
GFR, Estimated: >60 mL/min (ref >60)
Glucose, Bld: 269 mg/dL — ABNORMAL HIGH (ref 70–99)
Potassium: 4.5 mmol/L (ref 3.5–5.1)
Sodium: 137 mmol/L (ref 135–145)
Total Bilirubin: 0.4 mg/dL (ref 0.0–1.2)
Total Protein: 8 g/dL (ref 6.5–8.1)

## 2024-08-04 LAB — FERRITIN: Ferritin: 1502 ng/mL — ABNORMAL HIGH (ref 11–307)

## 2024-08-04 LAB — IRON AND IRON BINDING CAPACITY (CC-WL,HP ONLY)
Iron: 95 ug/dL (ref 28–170)
Saturation Ratios: 40 % — ABNORMAL HIGH (ref 10.4–31.8)
TIBC: 238 ug/dL — ABNORMAL LOW (ref 250–450)
UIBC: 143 ug/dL

## 2024-08-04 LAB — RETICULOCYTES
Immature Retic Fract: 11.5 % (ref 2.3–15.9)
RBC.: 4.12 MIL/uL (ref 3.87–5.11)
Retic Count, Absolute: 73.7 K/uL (ref 19.0–186.0)
Retic Ct Pct: 1.8 % (ref 0.4–3.1)

## 2024-08-04 NOTE — Progress Notes (Signed)
 Hematology and Oncology Follow Up Visit  Kristen Murray [id number] [birth year] 78 y.o. 08/04/2024   Principle Diagnosis:  Myelodysplasia -- low grade -- IPSS-R == 1.5 --  DNMT3A (+) Iron  deficiency anemia-malabsorption   Current Therapy:        IV iron  as indicated  --Venofer  given on 07/30/2023 Zarxio  480 mcg SQ PRN prior to procedures Aranesp  300 mcg sq for Hgb < 11   Interim History:  Kristen Murray is here today for follow-up. She is symptomatic with fatigue. She states that she had a fall due to dizziness a week ago while getting out of bed to use the bathroom. She slid off her bed into the floor. She denies hitting her head or losing consciousness.  No changes in range of motion in her limbs.  She has chronic pain in the left knee and states that ortho will be look at doing surgery next year if possible.  No obvious blood loss noted. No bruising or petechiae.  No fever, chills, n/v, cough, rash, chest pain, palpitations, abdominal pain or changes in bowel or bladder habits.  ANC 0.2, WBC count 2.2, platelets 154, Hgb 11.1 and MCV 84.  Appetite and hydration are good. Weight is stable at 169 lbs.   ECOG Performance Status: 1 - Symptomatic but completely ambulatory  Medications:  Allergies as of 08/04/2024       Reactions   Codeine Hives, Other (See Comments)   Because of a history of documented adverse serious drug reaction, Medi Alert bracelet  is recommended   Macrodantin  [nitrofurantoin  Macrocrystal] Other (See Comments)   Numbness, aching all over   Other Shortness Of Breath, Rash, Other (See Comments)   Pine nuts & walnuts throat congestion. No documented angioedema.   Propoxyphene Hives, Swelling, Rash, Other (See Comments)   Propoxyphene Hcl Hives, Swelling, Rash   Augmentin  [amoxicillin -pot Clavulanate] Itching   Dulaglutide Other (See Comments)   Other reaction(s): weak, ha, no appetite   Latex Rash, Other (See Comments)   Metformin  Hcl Other (See Comments)   Other  reaction(s): leg discomfort   Neosporin [bacitracin-polymyxin B] Other (See Comments)   Blisters   Zoster Vac Recomb Adjuvanted Rash, Other (See Comments)   Other reaction(s): confusion   Avocado Rash   Banana Rash   Pine nuts   Ciprofloxacin  Nausea Only, Other (See Comments)   Nausea (Note: Patient takes this when on mission trips, however)   Tizanidine Other (See Comments)   Reaction not recalled   Tramadol  Nausea Only        Medication List        Accurate as of August 04, 2024  1:06 PM. If you have any questions, ask your nurse or doctor.          albuterol  108 (90 Base) MCG/ACT inhaler Commonly known as: VENTOLIN  HFA Inhale 2 puffs into the lungs every 4 (four) hours as needed.   apixaban  5 MG Tabs tablet Commonly known as: Eliquis  Take 1 tablet (5 mg total) by mouth 2 (two) times daily.   ASCORBIC ACID PO Take 1 tablet by mouth daily. Dosage unknown   azelastine 0.1 % nasal spray Commonly known as: ASTELIN Place into both nostrils 2 (two) times daily. Use in each nostril as directed   b complex vitamins capsule Take 1 capsule by mouth daily.   cetirizine  10 MG tablet Commonly known as: ZYRTEC  Take 1 tablet (10 mg total) by mouth daily as needed for allergies (Can take an extra dose during flare ups.).  Cholecalciferol 25 MCG (1000 UT) capsule Take 1,000 Units by mouth daily.   cycloSPORINE 0.05 % ophthalmic emulsion Commonly known as: RESTASIS Place 1 drop into both eyes every Monday, Wednesday, and Friday.   dextromethorphan -guaiFENesin  30-600 MG 12hr tablet Commonly known as: MUCINEX  DM Take 1 tablet by mouth 2 (two) times daily.   DropSafe Alcohol Prep 70 % Pads Apply topically.   EPINEPHrine  0.3 mg/0.3 mL Soaj injection Commonly known as: EpiPen  2-Pak Inject 0.3 mg into the muscle as needed for anaphylaxis.   estradiol 0.1 MG/GM vaginal cream Commonly known as: ESTRACE Place 1 Applicatorful vaginally once a week. 1-2 weekly    fluorometholone 0.1 % ophthalmic suspension Commonly known as: FML Place 1 drop into both eyes every Monday, Wednesday, and Friday.   fluticasone  50 MCG/ACT nasal spray Commonly known as: FLONASE  Place 1 spray into both nostrils 2 (two) times daily.   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   glipiZIDE  10 MG 24 hr tablet Commonly known as: GLUCOTROL  XL Take 20 mg by mouth daily.   glucose blood test strip Inject 1 strip into the skin daily.   glucose blood test strip Commonly known as: FREESTYLE LITE CHECK BLOOD SUGAR ONCE DAILY AS DIRECTED.  DX:790.29   Lantus  SoloStar 100 UNIT/ML Solostar Pen Generic drug: insulin  glargine Inject 10 Units into the skin daily.   levothyroxine  25 MCG tablet Commonly known as: SYNTHROID  Take 25 mcg by mouth daily.   lidocaine  5 % ointment Commonly known as: XYLOCAINE  Apply 1 application topically 3 (three) times daily as needed.   metoprolol  tartrate 50 MG tablet Commonly known as: LOPRESSOR  Take 1.5 tablets (75 mg total) by mouth 2 (two) times daily.   montelukast  10 MG tablet Commonly known as: SINGULAIR  Take 1 tablet (10 mg total) by mouth at bedtime.   multivitamin tablet Take 1 tablet by mouth daily. Shaklee brand   nitroGLYCERIN  0.4 MG SL tablet Commonly known as: NITROSTAT  Place 1 tablet (0.4 mg total) under the tongue every 5 (five) minutes as needed for chest pain.   omeprazole 20 MG capsule Commonly known as: PRILOSEC Take 20 mg by mouth daily.   rosuvastatin  5 MG tablet Commonly known as: CRESTOR  Take 1 tablet (5 mg total) by mouth daily.   triamcinolone  0.025 % ointment Commonly known as: KENALOG  Apply 1 Application topically 2 (two) times daily. Use twice a day for 7 days as needed.   trolamine salicylate 10 % cream Commonly known as: ASPERCREME Apply 1 application  topically as needed for muscle pain.   valACYclovir  500 MG tablet Commonly known as: VALTREX  Take 1 tablet (500 mg total) by mouth 2 (two) times daily. Take twice a day for thee days as needed for an outbreak.   verapamil  180 MG CR tablet Commonly known as: CALAN -SR Take 180 mg by mouth at bedtime.        Allergies:  Allergies  Allergen Reactions   Codeine Hives and Other (See Comments)    Because of a history of documented adverse serious drug reaction, Medi Alert bracelet  is recommended   Macrodantin  [Nitrofurantoin  Macrocrystal] Other (See Comments)    Numbness, aching all over   Other Shortness Of Breath, Rash and Other (See Comments)    Pine nuts & walnuts throat congestion. No documented angioedema.    Propoxyphene Hives, Swelling, Rash and Other (See Comments)  Propoxyphene Hcl Hives, Swelling and Rash   Augmentin  [Amoxicillin -Pot Clavulanate] Itching   Dulaglutide Other (See Comments)    Other reaction(s): weak, ha, no appetite   Latex Rash and Other (See Comments)   Metformin  Hcl Other (See Comments)    Other reaction(s): leg discomfort   Neosporin [Bacitracin-Polymyxin B] Other (See Comments)    Blisters   Zoster Vac Recomb Adjuvanted Rash and Other (See Comments)    Other reaction(s): confusion   Avocado Rash   Banana Rash    Pine nuts   Ciprofloxacin  Nausea Only and Other (See Comments)    Nausea (Note: Patient takes this when on mission trips, however)   Tizanidine Other (See Comments)    Reaction not recalled   Tramadol  Nausea Only    Past Medical History, Surgical history, Social history, and Family History were reviewed and updated.  Review of Systems: All other 10 point review of systems is negative.   Physical Exam:  height is 5' 5 (1.651 m) and weight is 169 lb 8 oz (76.9 kg). Her oral temperature is 98.6 F (37 C). Her blood pressure is 128/52 (abnormal) and her pulse is 71. Her respiration is 19 and oxygen saturation is 99%.   Wt Readings  from Last 3 Encounters:  08/04/24 169 lb 8 oz (76.9 kg)  07/18/24 167 lb (75.8 kg)  07/08/24 168 lb (76.2 kg)    Ocular: Sclerae unicteric, pupils equal, round and reactive to light Ear-nose-throat: Oropharynx clear, dentition fair Lymphatic: No cervical or supraclavicular adenopathy Lungs no rales or rhonchi, good excursion bilaterally Heart regular rate and rhythm, no murmur appreciated Abd soft, nontender, positive bowel sounds MSK no focal spinal tenderness, no joint edema Neuro: non-focal, well-oriented, appropriate affect Breasts: Deferred   Lab Results  Component Value Date   WBC 2.3 (L) 07/04/2024   HGB 11.5 (L) 07/04/2024   HCT 35.9 (L) 07/04/2024   MCV 83.7 07/04/2024   PLT 169 07/04/2024   Lab Results  Component Value Date   FERRITIN 1,890 (H) 07/04/2024   IRON  29 07/04/2024   TIBC 238 (L) 07/04/2024   UIBC 209 07/04/2024   IRONPCTSAT 12 07/04/2024   Lab Results  Component Value Date   RETICCTPCT 1.8 08/04/2024   RBC 4.12 08/04/2024   No results found for: KPAFRELGTCHN, LAMBDASER, KAPLAMBRATIO No results found for: IGGSERUM, IGA, IGMSERUM No results found for: STEPHANY CARLOTA BENSON MARKEL EARLA JOANNIE DOC VICK, SPEI   Chemistry      Component Value Date/Time   NA 135 07/04/2024 1236   NA 138 03/10/2020 1138   K 4.2 07/04/2024 1236   CL 98 07/04/2024 1236   CO2 26 07/04/2024 1236   BUN 15 07/04/2024 1236   BUN 10 03/10/2020 1138   CREATININE 0.67 07/04/2024 1236      Component Value Date/Time   CALCIUM  9.8 07/04/2024 1236   ALKPHOS 103 07/04/2024 1236   AST 25 07/04/2024 1236   ALT 19 07/04/2024 1236   BILITOT 0.3 07/04/2024 1236       Impression and Plan: Ms. Larios is a very pleasant 78 yo African American female with low-grade MDS, low IPSS-R score.  Iron  studies are pending. We will replace if needed.  No Zarxio  and ESA given this visit.  We will see her again in 1 month.   Lauraine Pepper,  NP 10/6/20251:06 PM

## 2024-08-04 NOTE — Telephone Encounter (Signed)
 Received call from lab with a critical ANC of 0.24. Results given to Lauraine Franchot PIETY, who is seeing the patient.

## 2024-08-06 LAB — CBC WITH DIFFERENTIAL (CANCER CENTER ONLY)
Abs Immature Granulocytes: 0.04 K/uL (ref 0.00–0.07)
Basophils Absolute: 0 K/uL (ref 0.0–0.1)
Basophils Relative: 1 %
Eosinophils Absolute: 0 K/uL (ref 0.0–0.5)
Eosinophils Relative: 1 %
HCT: 34.9 % — ABNORMAL LOW (ref 36.0–46.0)
Hemoglobin: 11.1 g/dL — ABNORMAL LOW (ref 12.0–15.0)
Immature Granulocytes: 2 %
Lymphocytes Relative: 72 %
Lymphs Abs: 1.6 K/uL (ref 0.7–4.0)
MCH: 26.7 pg (ref 26.0–34.0)
MCHC: 31.8 g/dL (ref 30.0–36.0)
MCV: 84.1 fL (ref 80.0–100.0)
Monocytes Absolute: 0.3 K/uL (ref 0.1–1.0)
Monocytes Relative: 14 %
Neutro Abs: 0.2 K/uL — CL (ref 1.7–7.7)
Neutrophils Relative %: 11 %
Platelet Count: 154 K/uL (ref 150–400)
RBC: 4.15 MIL/uL (ref 3.87–5.11)
RDW: 17.9 % — ABNORMAL HIGH (ref 11.5–15.5)
Smear Review: NORMAL
WBC Count: 2.2 K/uL — ABNORMAL LOW (ref 4.0–10.5)
nRBC: 0 % (ref 0.0–0.2)

## 2024-08-12 NOTE — Patient Instructions (Incomplete)
  1. Continue avoidance - tree nuts, pine nuts, banana, avocado,  tree, grass, weed  2. Continue to Treat and prevent inflammation:   A. Flonase  1-2 sprays each nostril one time per day  B. montelukast  10 mg tablet once a day  3. Continue to Treat LPR:   A. Continue to consolidate all caffeine and chocolate use  B. Continue to replace throat clearing with swallowing maneuver  C. Pantoprazole  40 mg - 1 tablet 1-2 times per day  4. If needed:   A. nasal saline  B. OTC antihistamine - Cetirizine  (Zyrtec ). Try decreasing this medication to once a day as this can make you feel sleepy. Please discuss your sleepiness/headaches with your primary care physician  C. Epi-Pen  D. Can add OTC Mucinex  two times per day  E. Ipratropium 0.06% 2 sprays each nostril every 6 hours . This can help with runny nose/drainage down the throat. Let us  know if you have this medication and are using it  F. Azelastine nasal spray as per primary care physician  5. Influenza = Tamiflu. Covid = Paxlovid  7. Return to clinic in 3 months or earlier if problem

## 2024-08-13 ENCOUNTER — Encounter: Payer: Self-pay | Admitting: Family

## 2024-08-13 ENCOUNTER — Ambulatory Visit: Admitting: Family

## 2024-08-13 ENCOUNTER — Other Ambulatory Visit: Payer: Self-pay

## 2024-08-13 VITALS — BP 140/58 | HR 73 | Temp 97.7°F

## 2024-08-13 DIAGNOSIS — J301 Allergic rhinitis due to pollen: Secondary | ICD-10-CM | POA: Diagnosis not present

## 2024-08-13 DIAGNOSIS — K219 Gastro-esophageal reflux disease without esophagitis: Secondary | ICD-10-CM | POA: Diagnosis not present

## 2024-08-13 DIAGNOSIS — T7805XD Anaphylactic reaction due to tree nuts and seeds, subsequent encounter: Secondary | ICD-10-CM

## 2024-08-13 MED ORDER — FLUTICASONE PROPIONATE 50 MCG/ACT NA SUSP: 1.0000 | Freq: Two times a day (BID) | NASAL | 1 refills | Status: AC

## 2024-08-13 MED ORDER — CETIRIZINE HCL 10 MG PO TABS: ORAL_TABLET | ORAL | 3 refills | Status: AC

## 2024-08-13 NOTE — Progress Notes (Signed)
 [address] [place] [address] Dept: [phone]  FOLLOW UP NOTE  Patient ID: Kristen Murray, female    DOB: 1946/07/13  Age: 78 y.o. 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  She was last seen on February 12, 2024 by Dr. Maurilio.  She reports since her last office visit she was hospitalized in May for day or 2 for pneumonia.  She had a CT angiography chest with contrast showing: IMPRESSION: 1. No evidence of pulmonary embolus. 2. Patchy bilateral ground-glass airspace disease and bilateral bronchial wall thickening, consistent with multifocal pneumonia. 3. Mediastinal and hilar adenopathy likely reactive. 4.  Aortic Atherosclerosis (ICD10-I70.0)  She also has an upcoming MRI scheduled in November  to follow-up on her meningioma  Seasonal allergic rhinitis due to pollen: She reports she always has clear rhinorrhea and postnasal drip.  She wonders if this is what contributed to her pneumonia.  She denies nasal congestion.  She reports that she goes through tissues like crazy.  She has azelastine nasal spray that she uses 1 spray each nostril twice a day on most days that was prescribed by her primary care physician.  She also uses Flonase  nasal spray as needed.  She is not certain if she uses or has ipratropium 0.06% nasal spray.  She mentions after she got more than 3 medications it is hard for her to remember her medications.  She also takes Zyrtec  10 mg once a day and sometimes twice a day.  She has not been treated for any sinus infections since we last saw her.  She did have a CT head without contrast on May 17 showing: IMPRESSION: 1. Acute on chronic sinusitis. 2. No acute intracranial  process. 3. Stable calcified right tentorial meningioma.  No mass effect.  Food allergy: She continues to avoid tree nuts, pine nuts, banana, and avocado without any accidental ingestion.  She thinks that her EpiPen  is not up-to-date and does not want to buy another one.  She pays attention to what she eats.  Laryngopharyngeal reflux disease is reported as good.  She tries to avoid foods.  She does take pantoprazole  once a day.  She reports that she will occasionally have headaches that are in her forehead region. She describes them as  not much pain. It is more of her feeling sleepy.  Discussed decreasing her antihistamine (Zyrtec ) to see if this helps.  She mentions a lot of her medications states that they could potentially make her sleepy/dizzy.   Drug Allergies:  Allergies  Allergen Reactions   Codeine Hives and Other (See Comments)    Because of a history of documented adverse serious drug reaction, Medi Alert bracelet  is recommended   Macrodantin  [Nitrofurantoin  Macrocrystal] Other (See Comments)    Numbness, aching all over   Other Shortness Of Breath, Rash and Other (See Comments)    Pine nuts & walnuts throat congestion. No documented angioedema.    Propoxyphene Hives, Swelling, Rash and Other (See Comments)   Propoxyphene Hcl Hives, Swelling and Rash   Augmentin  [Amoxicillin -Pot Clavulanate] Itching   Dulaglutide Other (See Comments)    Other reaction(s): weak, ha, no appetite   Latex Rash and Other (See Comments)   Metformin  Hcl Other (See Comments)  Other reaction(s): leg discomfort   Neosporin [Bacitracin-Polymyxin B] Other (See Comments)    Blisters   Zoster Vac Recomb Adjuvanted Rash and Other (See Comments)    Other reaction(s): confusion   Avocado Rash   Banana Rash    Pine nuts   Ciprofloxacin  Nausea Only and Other (See Comments)    Nausea (Note: Patient takes this when on mission trips, however)   Tizanidine Other (See Comments)    Reaction not recalled    Tramadol  Nausea Only    Review of Systems: Negative except as per HPI   Physical Exam: BP (!) 140/58   Pulse 73   Temp 97.7 F (36.5 C)   LMP 07/30/1990   SpO2 97%    Physical Exam Constitutional:      Appearance: Normal appearance. She is well-developed.  HENT:     Head: Normocephalic and atraumatic.     Comments: Pharynx normal, eyes normal, ears normal, nose normal    Right Ear: Tympanic membrane, ear canal and external ear normal.     Left Ear: Tympanic membrane, ear canal and external ear normal.     Nose: Nose normal.     Mouth/Throat:     Mouth: Mucous membranes are moist.     Pharynx: Oropharynx is clear.  Eyes:     Conjunctiva/sclera: Conjunctivae normal.  Cardiovascular:     Rate and Rhythm: Regular rhythm.     Heart sounds: Normal heart sounds.  Pulmonary:     Effort: Pulmonary effort is normal.     Breath sounds: Normal breath sounds.     Comments: Lungs clear to auscultation Musculoskeletal:     Cervical back: Neck supple.  Skin:    General: Skin is warm.  Neurological:     Mental Status: She is alert and oriented to person, place, and time.  Psychiatric:        Mood and Affect: Mood normal.        Behavior: Behavior normal.        Thought Content: Thought content normal.        Judgment: Judgment normal.     Diagnostics: None  Assessment and Plan: 1. Seasonal allergic rhinitis due to pollen   2. 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 LPRD (laryngopharyngeal reflux disease)     No orders of the defined types were placed in this encounter.   Patient Instructions   1. Continue avoidance - tree nuts, pine nuts, banana, avocado,  tree, grass, weed  2. Continue to Treat and prevent inflammation:   A. Flonase  1-2 sprays each nostril one time per day  B. montelukast  10 mg tablet once a day  3. Continue to Treat LPR:   A. Continue to consolidate all caffeine and chocolate use  B. Continue to replace  throat clearing with swallowing maneuver  C. Pantoprazole  40 mg - 1 tablet 1-2 times per day  4. If needed:   A. nasal saline  B. OTC antihistamine - Cetirizine  (Zyrtec ). Try decreasing this medication to once a day as this can make you feel sleepy. Please discuss your sleepiness/headaches with your primary care physician  C. Epi-Pen  D. Can add OTC Mucinex  two times per day  E. Ipratropium 0.06% 2 sprays each nostril every 6 hours . This can help with runny nose/drainage down the throat. Let us  know if you have this medication and are using it  F. Azelastine nasal spray as per primary care physician  5. Influenza =  Tamiflu. Covid = Paxlovid  7. Return to clinic in 3 months or earlier if problem        Return in about 3 months (around 11/13/2024), or if symptoms worsen or fail to improve.    Thank you for the opportunity to care for this patient.  Please do not hesitate to contact me with questions.  Wanda Craze, FNP [facility] 

## 2024-08-21 ENCOUNTER — Other Ambulatory Visit: Payer: Self-pay | Admitting: *Deleted

## 2024-08-21 MED ORDER — IPRATROPIUM BROMIDE 0.06 % NA SOLN: 2.0000 | Freq: Four times a day (QID) | NASAL | 5 refills | Status: AC | PRN

## 2024-08-25 ENCOUNTER — Other Ambulatory Visit: Payer: Self-pay | Admitting: Internal Medicine

## 2024-08-25 ENCOUNTER — Telehealth: Payer: Self-pay | Admitting: *Deleted

## 2024-08-25 DIAGNOSIS — R1909 Other intra-abdominal and pelvic swelling, mass and lump: Secondary | ICD-10-CM

## 2024-08-25 NOTE — Telephone Encounter (Signed)
 Call placed to patients home number, left detailed message, ok per dpr.   Call placed to patients mobile number, spoke with patient, advised per Dr. Nikki. Patient states she is scheduled for CT pelvis w contrast on 10/28 ordered by her PCP in f/u to previous imaging. Patient states she has not discussed pain she is experiencing with PCP. Advised again per Dr. Cyrilla recommendations. Patient verbalizes understanding and is agreeable. Patient aware to contact the office if she needs f/u after being evaluated.   Routing to provider for final review. Patient is agreeable to disposition. Will close encounter.

## 2024-08-25 NOTE — Telephone Encounter (Signed)
 I recommend patient go to the emergency department for evaluation.

## 2024-08-25 NOTE — Telephone Encounter (Signed)
 Spoke with patient. Patient reports pain below stomach in vulvar area, started a few days ago. Describes as intermittent sharp pricks. Pain 7 out of 10 on Pain scale.   Denies odor, vaginal d/c, bleeding, fever/chills, flank pain, dysuria.   Hx of IC, states this pain is different, typically with IC she experiences pain lower and more painful. Reports increased urination, has increased fluids. Urine was dark yellow, now clear.   See Urology at [facility], states she was previously prescribed cephalexin  has some leftover at home,  she took one dose on 10/26 and again today. Reports pain subsides and returns.   Advised I will review with Dr. Nikki and f/u with recommendations. Patient also agreeable to see NP if recommended.   Last AEX 07/08/24  Dr. Nikki -please review.

## 2024-08-26 ENCOUNTER — Ambulatory Visit
Admission: RE | Admit: 2024-08-26 | Discharge: 2024-08-26 | Disposition: A | Discharge status: Home / Self Care | Source: Ambulatory Visit | Attending: Internal Medicine | Admitting: Internal Medicine

## 2024-08-26 DIAGNOSIS — R1909 Other intra-abdominal and pelvic swelling, mass and lump: Secondary | ICD-10-CM

## 2024-08-26 MED ORDER — IOPAMIDOL (ISOVUE-300) INJECTION 61%
80.0000 mL | Freq: Once | INTRAVENOUS | Status: AC | PRN
  Administered 2024-08-26: 80 mL via INTRAVENOUS

## 2024-09-05 ENCOUNTER — Inpatient Hospital Stay

## 2024-09-05 ENCOUNTER — Inpatient Hospital Stay: Admitting: Family

## 2024-09-05 ENCOUNTER — Inpatient Hospital Stay: Attending: Hematology & Oncology

## 2024-09-05 ENCOUNTER — Telehealth: Payer: Self-pay

## 2024-09-05 ENCOUNTER — Encounter: Payer: Self-pay | Admitting: *Deleted

## 2024-09-05 VITALS — BP 138/74 | HR 74 | Resp 16 | Ht 65.0 in | Wt 169.0 lb

## 2024-09-05 DIAGNOSIS — D500 Iron deficiency anemia secondary to blood loss (chronic): Secondary | ICD-10-CM

## 2024-09-05 DIAGNOSIS — D72819 Decreased white blood cell count, unspecified: Secondary | ICD-10-CM

## 2024-09-05 DIAGNOSIS — D46Z Other myelodysplastic syndromes: Secondary | ICD-10-CM | POA: Diagnosis not present

## 2024-09-05 DIAGNOSIS — D4620 Refractory anemia with excess of blasts, unspecified: Secondary | ICD-10-CM | POA: Insufficient documentation

## 2024-09-05 LAB — CBC WITH DIFFERENTIAL (CANCER CENTER ONLY)
Abs Immature Granulocytes: 0.01 K/uL (ref 0.00–0.07)
Basophils Absolute: 0 K/uL (ref 0.0–0.1)
Basophils Relative: 1 %
Eosinophils Absolute: 0 K/uL (ref 0.0–0.5)
Eosinophils Relative: 1 %
HCT: 33.7 % — ABNORMAL LOW (ref 36.0–46.0)
Hemoglobin: 10.7 g/dL — ABNORMAL LOW (ref 12.0–15.0)
Immature Granulocytes: 1 %
Lymphocytes Relative: 74 %
Lymphs Abs: 1.6 K/uL (ref 0.7–4.0)
MCH: 27.4 pg (ref 26.0–34.0)
MCHC: 31.8 g/dL (ref 30.0–36.0)
MCV: 86.2 fL (ref 80.0–100.0)
Monocytes Absolute: 0.3 K/uL (ref 0.1–1.0)
Monocytes Relative: 14 %
Neutro Abs: 0.2 K/uL — CL (ref 1.7–7.7)
Neutrophils Relative %: 9 %
Platelet Count: 156 K/uL (ref 150–400)
RBC: 3.91 MIL/uL (ref 3.87–5.11)
RDW: 16.9 % — ABNORMAL HIGH (ref 11.5–15.5)
Smear Review: NORMAL
WBC Count: 2.2 K/uL — ABNORMAL LOW (ref 4.0–10.5)
nRBC: 0 % (ref 0.0–0.2)

## 2024-09-05 LAB — IRON AND IRON BINDING CAPACITY (CC-WL,HP ONLY)
Iron: 32 ug/dL (ref 28–170)
Saturation Ratios: 14 % (ref 10.4–31.8)
TIBC: 235 ug/dL — ABNORMAL LOW (ref 250–450)
UIBC: 203 ug/dL

## 2024-09-05 LAB — CMP (CANCER CENTER ONLY)
ALT: 9 U/L (ref 0–44)
AST: 18 U/L (ref 15–41)
Albumin: 3.9 g/dL (ref 3.5–5.0)
Alkaline Phosphatase: 103 U/L (ref 38–126)
Anion gap: 10 (ref 5–15)
BUN: 13 mg/dL (ref 8–23)
CO2: 25 mmol/L (ref 22–32)
Calcium: 9.8 mg/dL (ref 8.9–10.3)
Chloride: 101 mmol/L (ref 98–111)
Creatinine: 0.63 mg/dL (ref 0.44–1.00)
GFR, Estimated: >60 mL/min (ref >60)
Glucose, Bld: 304 mg/dL — ABNORMAL HIGH (ref 70–99)
Potassium: 4.5 mmol/L (ref 3.5–5.1)
Sodium: 136 mmol/L (ref 135–145)
Total Bilirubin: 0.3 mg/dL (ref 0.0–1.2)
Total Protein: 7.8 g/dL (ref 6.5–8.1)

## 2024-09-05 LAB — RETICULOCYTES
Immature Retic Fract: 19.8 % — ABNORMAL HIGH (ref 2.3–15.9)
RBC.: 4.01 MIL/uL (ref 3.87–5.11)
Retic Count, Absolute: 24.9 K/uL (ref 19.0–186.0)
Retic Ct Pct: 0.6 % (ref 0.4–3.1)

## 2024-09-05 LAB — FERRITIN: Ferritin: 1427 ng/mL — ABNORMAL HIGH (ref 11–307)

## 2024-09-05 MED ORDER — DARBEPOETIN ALFA 300 MCG/0.6ML IJ SOSY
300.0000 ug | PREFILLED_SYRINGE | Freq: Once | INTRAMUSCULAR | Status: AC
  Administered 2024-09-05: 300 ug via SUBCUTANEOUS
  Filled 2024-09-05: qty 0.6

## 2024-09-05 NOTE — Progress Notes (Signed)
 Hematology and Oncology Follow Up Visit  Kristen Murray [id number] 21-Jul-1946 78 y.o. 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 She is doing well right now. She has had some episodes of dizziness with vertigo when she lays down and has to prop up for a bit until the dizziness subsides.  No falls or syncope reported.  No swelling in her extremities.  Appetite and hydration are good. Weight is stable at 169 lbs.  She notes occasional weakness with her voice without trouble swallowing, slurred speech or SOB.  No fever, chills, n/v, cough, rash, SOB, chest pain, palpitations, abdominal pain or changes in bowel or bladder habits.  No blood loss, bruising or petechiae.   ECOG Performance Status: 1 - Symptomatic but completely ambulatory  Medications:  Allergies as of 09/05/2024       Reactions   Codeine Hives, Other (See Comments)   Because of a history of documented adverse serious drug reaction, Medi Alert bracelet  is recommended   Macrodantin  [nitrofurantoin  Macrocrystal] Other (See Comments)   Numbness, aching all over   Other Shortness Of Breath, Rash, Other (See Comments)   Pine nuts & walnuts throat congestion. No documented angioedema.   Propoxyphene Hives, Swelling, Rash, Other (See Comments)   Propoxyphene Hcl Hives, Swelling, Rash   Augmentin  [amoxicillin -pot Clavulanate] Itching   Dulaglutide Other (See Comments)   Other reaction(s): weak, ha, no appetite   Latex Rash, Other (See Comments)   Metformin  Hcl Other (See Comments)   Other reaction(s): leg discomfort   Neosporin [bacitracin-polymyxin B] Other (See Comments)   Blisters   Zoster Vac Recomb Adjuvanted Rash, Other  (See Comments)   Other reaction(s): confusion   Avocado Rash   Banana Rash   Pine nuts   Ciprofloxacin  Nausea Only, Other (See Comments)   Nausea (Note: Patient takes this when on mission trips, however)   Tizanidine Other (See Comments)   Reaction not recalled   Tramadol  Nausea Only        Medication List        Accurate as of September 05, 2024  1:01 PM. If you have any questions, ask your nurse or doctor.          albuterol  108 (90 Base) MCG/ACT inhaler Commonly known as: VENTOLIN  HFA Inhale 2 puffs into the lungs every 4 (four) hours as needed.   apixaban  5 MG Tabs tablet Commonly known as: Eliquis  Take 1 tablet (5 mg total) by mouth 2 (two) times daily.   ASCORBIC ACID PO Take 1 tablet by mouth daily. Dosage unknown   azelastine 0.1 % nasal spray Commonly known as: ASTELIN Place into both nostrils 2 (two) times daily. Use in each nostril as directed   b complex vitamins capsule Take 1 capsule by mouth daily.   cetirizine  10 MG tablet Commonly known as: ZYRTEC  Take 1 tablet once a day as needed for runny nose/drainage down throat   Cholecalciferol 25 MCG (1000 UT) capsule Take 1,000 Units by mouth daily.   cycloSPORINE 0.05 % ophthalmic emulsion Commonly known as: RESTASIS Place 1 drop into both eyes every Monday, Wednesday, and Friday.   dextromethorphan -guaiFENesin  30-600 MG  12hr tablet Commonly known as: MUCINEX  DM Take 1 tablet by mouth 2 (two) times daily.   DropSafe Alcohol Prep 70 % Pads Apply topically.   EPINEPHrine  0.3 mg/0.3 mL Soaj injection Commonly known as: EpiPen  2-Pak Inject 0.3 mg into the muscle as needed for anaphylaxis.   estradiol 0.1 MG/GM vaginal cream Commonly known as: ESTRACE Place 1 Applicatorful vaginally once a week. 1-2 weekly   fluorometholone 0.1 % ophthalmic suspension Commonly known as: FML Place 1 drop into both eyes every Monday, Wednesday, and Friday.   fluticasone  50 MCG/ACT nasal spray Commonly known  as: FLONASE  Place 1 spray into both nostrils 2 (two) times daily.   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   glipiZIDE  10 MG 24 hr tablet Commonly known as: GLUCOTROL  XL Take 20 mg by mouth daily.   glucose blood test strip Inject 1 strip into the skin daily.   glucose blood test strip Commonly known as: FREESTYLE LITE CHECK BLOOD SUGAR ONCE DAILY AS DIRECTED.  DX:790.29   ipratropium 0.06 % nasal spray Commonly known as: ATROVENT  Place 2 sprays into both nostrils every 6 (six) hours as needed.   Lantus  SoloStar 100 UNIT/ML Solostar Pen Generic drug: insulin  glargine Inject 10 Units into the skin daily.   levothyroxine  25 MCG tablet Commonly known as: SYNTHROID  Take 25 mcg by mouth daily.   lidocaine  5 % ointment Commonly known as: XYLOCAINE  Apply 1 application topically 3 (three) times daily as needed.   metoprolol  tartrate 50 MG tablet Commonly known as: LOPRESSOR  Take 1.5 tablets (75 mg total) by mouth 2 (two) times daily.   montelukast  10 MG tablet Commonly known as: SINGULAIR  Take 1 tablet (10 mg total) by mouth at bedtime.   multivitamin tablet Take 1 tablet by mouth daily. Shaklee brand   nitroGLYCERIN  0.4 MG SL tablet Commonly known as: NITROSTAT  Place 1 tablet (0.4 mg total) under the tongue every 5 (five) minutes as needed for chest pain.   omeprazole 20 MG capsule Commonly known as: PRILOSEC Take 20 mg by mouth daily.   rosuvastatin  5 MG tablet Commonly known as: CRESTOR  Take 1 tablet (5 mg total) by mouth daily.   triamcinolone  0.025 % ointment Commonly known as: KENALOG  Apply 1 Application topically 2 (two) times daily. Use twice a day for 7 days as needed.   trolamine salicylate 10 % cream Commonly known as: ASPERCREME Apply 1 application topically as needed for muscle pain.   valACYclovir  500 MG  tablet Commonly known as: VALTREX  Take 1 tablet (500 mg total) by mouth 2 (two) times daily. Take twice a day for thee days as needed for an outbreak.   verapamil  180 MG CR tablet Commonly known as: CALAN -SR Take 180 mg by mouth at bedtime.        Allergies:  Allergies  Allergen Reactions   Codeine Hives and Other (See Comments)    Because of a history of documented adverse serious drug reaction, Medi Alert bracelet  is recommended   Macrodantin  [Nitrofurantoin  Macrocrystal] Other (See Comments)    Numbness, aching all over   Other Shortness Of Breath, Rash and Other (See Comments)    Pine nuts & walnuts throat congestion. No documented angioedema.    Propoxyphene Hives, Swelling, Rash and Other (See Comments)   Propoxyphene Hcl Hives, Swelling and Rash   Augmentin  [Amoxicillin -Pot Clavulanate] Itching   Dulaglutide Other (  See Comments)    Other reaction(s): weak, ha, no appetite   Latex Rash and Other (See Comments)   Metformin  Hcl Other (See Comments)    Other reaction(s): leg discomfort   Neosporin [Bacitracin-Polymyxin B] Other (See Comments)    Blisters   Zoster Vac Recomb Adjuvanted Rash and Other (See Comments)    Other reaction(s): confusion   Avocado Rash   Banana Rash    Pine nuts   Ciprofloxacin  Nausea Only and Other (See Comments)    Nausea (Note: Patient takes this when on mission trips, however)   Tizanidine Other (See Comments)    Reaction not recalled   Tramadol  Nausea Only    Past Medical History, Surgical history, Social history, and Family History were reviewed and updated.  Review of Systems: All other 10 point review of systems is negative.   Physical Exam:  vitals were not taken for this visit.   Wt Readings from Last 3 Encounters:  08/04/24 169 lb 8 oz (76.9 kg)  07/18/24 167 lb (75.8 kg)  07/08/24 168 lb (76.2 kg)    Ocular: Sclerae unicteric, pupils equal, round and reactive to light Ear-nose-throat: Oropharynx clear, dentition  fair Lymphatic: No cervical or supraclavicular adenopathy Lungs no rales or rhonchi, good excursion bilaterally Heart regular rate and rhythm, no murmur appreciated Abd soft, nontender, positive bowel sounds MSK no focal spinal tenderness, no joint edema Neuro: non-focal, well-oriented, appropriate affect Breasts: Deferred   Lab Results  Component Value Date   WBC 2.2 (L) 08/04/2024   HGB 11.1 (L) 08/04/2024   HCT 34.9 (L) 08/04/2024   MCV 84.1 08/04/2024   PLT 154 08/04/2024   Lab Results  Component Value Date   FERRITIN 1,502 (H) 08/04/2024   IRON  95 08/04/2024   TIBC 238 (L) 08/04/2024   UIBC 143 08/04/2024   IRONPCTSAT 40 (H) 08/04/2024   Lab Results  Component Value Date   RETICCTPCT 1.8 08/04/2024   RBC 4.15 08/04/2024   RBC 4.12 08/04/2024   No results found for: KPAFRELGTCHN, LAMBDASER, KAPLAMBRATIO No results found for: IGGSERUM, IGA, IGMSERUM No results found for: STEPHANY CARLOTA BENSON MARKEL EARLA JOANNIE DOC VICK, SPEI   Chemistry      Component Value Date/Time   NA 137 08/04/2024 1242   NA 138 03/10/2020 1138   K 4.5 08/04/2024 1242   CL 101 08/04/2024 1242   CO2 26 08/04/2024 1242   BUN 10 08/04/2024 1242   BUN 10 03/10/2020 1138   CREATININE 0.61 08/04/2024 1242      Component Value Date/Time   CALCIUM  9.6 08/04/2024 1242   ALKPHOS 104 08/04/2024 1242   AST 20 08/04/2024 1242   ALT 14 08/04/2024 1242   BILITOT 0.4 08/04/2024 1242       Impression and Plan: Ms. Milo is a very pleasant 78 yo African American female with low-grade MDS, low IPSS-R score.  Iron  studies are pending. We will replace if needed.  No Zarxio  given this visit.  Aranesp  given, Hgb 10.7.  We will see her again in 1 month.   Lauraine Pepper, NP 11/7/20251:01 PM

## 2024-09-05 NOTE — Telephone Encounter (Signed)
 CRITICAL   Mary from [facility] lab called with critical absolute neutrophil 0.2  Lauraine Pepper, NP is aware.

## 2024-09-05 NOTE — Patient Instructions (Signed)

## 2024-09-08 ENCOUNTER — Telehealth: Payer: Self-pay

## 2024-09-08 NOTE — Telephone Encounter (Signed)
 Pt called in stating that she is having lower mid back pain since receiving her Aranesp  injection last week. Per Lauraine Pepper this is normal for the body to get sore as this is the bone marrowing doing its job to increase RBCs. Take Ibuprofen for pain PRN and it should go away in a few days.  Pt advised and states understanding. Requested a CB with any further issues.

## 2024-09-23 ENCOUNTER — Ambulatory Visit: Admitting: Podiatry

## 2024-09-23 ENCOUNTER — Encounter: Payer: Self-pay | Admitting: Podiatry

## 2024-09-23 DIAGNOSIS — D689 Coagulation defect, unspecified: Secondary | ICD-10-CM

## 2024-09-23 DIAGNOSIS — M79674 Pain in right toe(s): Secondary | ICD-10-CM

## 2024-09-23 DIAGNOSIS — B351 Tinea unguium: Secondary | ICD-10-CM

## 2024-09-23 DIAGNOSIS — E1149 Type 2 diabetes mellitus with other diabetic neurological complication: Secondary | ICD-10-CM | POA: Diagnosis not present

## 2024-09-23 DIAGNOSIS — M79675 Pain in left toe(s): Secondary | ICD-10-CM

## 2024-09-23 NOTE — Progress Notes (Addendum)
 This patient returns to my office for at risk foot care.  This patient requires this care by a professional since this patient will be at risk due to having type 2 diabetes and coagulation defect due to eliquis .This patient is unable to cut nails herself since the patient cannot reach her nails.These nails are painful walking and wearing shoes.  This patient presents for at risk foot care today.  General Appearance  Alert, conversant and in no acute stress.  Vascular  Dorsalis pedis and posterior tibial  pulses are palpable  bilaterally.  Capillary return is within normal limits  bilaterally. Temperature is within normal limits  bilaterally.  Neurologic  Senn-Weinstein monofilament wire test within normal limits  bilaterally. Muscle power within normal limits bilaterally.  Nails Thick disfigured discolored nails with subungual debris  from hallux to fifth toes bilaterally. No evidence of bacterial infection or drainage bilaterally.  Orthopedic  No limitations of motion  feet .  No crepitus or effusions noted.  No bony pathology or digital deformities noted.  Skin  normotropic skin with no porokeratosis noted bilaterally.  No signs of infections or ulcers noted.     Onychomycosis  Pain in right toes  Pain in left toes  Consent was obtained for treatment procedures.   Mechanical debridement of nails 1-5  bilaterally performed with a nail nipper.  Filed with dremel without incident.    Return office visit    3 months                 Told patient to return for periodic foot care and evaluation due to potential at risk complications.   Cordella Bold DPM

## 2024-10-06 ENCOUNTER — Inpatient Hospital Stay: Attending: Hematology & Oncology

## 2024-10-06 ENCOUNTER — Inpatient Hospital Stay

## 2024-10-06 ENCOUNTER — Telehealth: Payer: Self-pay | Admitting: *Deleted

## 2024-10-06 ENCOUNTER — Encounter: Payer: Self-pay | Admitting: Family

## 2024-10-06 ENCOUNTER — Inpatient Hospital Stay: Admitting: Family

## 2024-10-06 VITALS — BP 157/71 | HR 60 | Temp 98.0°F | Resp 18 | Wt 169.0 lb

## 2024-10-06 DIAGNOSIS — D4620 Refractory anemia with excess of blasts, unspecified: Secondary | ICD-10-CM | POA: Diagnosis present

## 2024-10-06 DIAGNOSIS — K909 Intestinal malabsorption, unspecified: Secondary | ICD-10-CM | POA: Insufficient documentation

## 2024-10-06 DIAGNOSIS — D500 Iron deficiency anemia secondary to blood loss (chronic): Secondary | ICD-10-CM

## 2024-10-06 DIAGNOSIS — M5430 Sciatica, unspecified side: Secondary | ICD-10-CM | POA: Insufficient documentation

## 2024-10-06 DIAGNOSIS — D508 Other iron deficiency anemias: Secondary | ICD-10-CM | POA: Insufficient documentation

## 2024-10-06 DIAGNOSIS — D72819 Decreased white blood cell count, unspecified: Secondary | ICD-10-CM

## 2024-10-06 DIAGNOSIS — D46Z Other myelodysplastic syndromes: Secondary | ICD-10-CM | POA: Diagnosis not present

## 2024-10-06 DIAGNOSIS — Z79899 Other long term (current) drug therapy: Secondary | ICD-10-CM | POA: Insufficient documentation

## 2024-10-06 LAB — CBC WITH DIFFERENTIAL (CANCER CENTER ONLY)
Abs Immature Granulocytes: 0.07 K/uL (ref 0.00–0.07)
Basophils Absolute: 0 K/uL (ref 0.0–0.1)
Basophils Relative: 0 %
Eosinophils Absolute: 0 K/uL (ref 0.0–0.5)
Eosinophils Relative: 0 %
HCT: 37.3 % (ref 36.0–46.0)
Hemoglobin: 12.3 g/dL (ref 12.0–15.0)
Immature Granulocytes: 2 %
Lymphocytes Relative: 81 %
Lymphs Abs: 2.4 K/uL (ref 0.7–4.0)
MCH: 28.1 pg (ref 26.0–34.0)
MCHC: 33 g/dL (ref 30.0–36.0)
MCV: 85.4 fL (ref 80.0–100.0)
Monocytes Absolute: 0.2 K/uL (ref 0.1–1.0)
Monocytes Relative: 7 %
Neutro Abs: 0.3 K/uL — CL (ref 1.7–7.7)
Neutrophils Relative %: 10 %
Platelet Count: 184 K/uL (ref 150–400)
RBC: 4.37 MIL/uL (ref 3.87–5.11)
RDW: 16.2 % — ABNORMAL HIGH (ref 11.5–15.5)
Smear Review: NORMAL
WBC Count: 2.9 K/uL — ABNORMAL LOW (ref 4.0–10.5)
nRBC: 0 % (ref 0.0–0.2)

## 2024-10-06 LAB — CMP (CANCER CENTER ONLY)
ALT: 26 U/L (ref 0–44)
AST: 18 U/L (ref 15–41)
Albumin: 4 g/dL (ref 3.5–5.0)
Alkaline Phosphatase: 125 U/L (ref 38–126)
Anion gap: 9 (ref 5–15)
BUN: 17 mg/dL (ref 8–23)
CO2: 29 mmol/L (ref 22–32)
Calcium: 9.6 mg/dL (ref 8.9–10.3)
Chloride: 98 mmol/L (ref 98–111)
Creatinine: 0.68 mg/dL (ref 0.44–1.00)
GFR, Estimated: >60 mL/min (ref >60)
Glucose, Bld: 375 mg/dL — ABNORMAL HIGH (ref 70–99)
Potassium: 4.1 mmol/L (ref 3.5–5.1)
Sodium: 136 mmol/L (ref 135–145)
Total Bilirubin: 0.3 mg/dL (ref 0.0–1.2)
Total Protein: 7.3 g/dL (ref 6.5–8.1)

## 2024-10-06 LAB — RETICULOCYTES
Immature Retic Fract: 5.4 % (ref 2.3–15.9)
RBC.: 4.35 MIL/uL (ref 3.87–5.11)
Retic Count, Absolute: 47 K/uL (ref 19.0–186.0)
Retic Ct Pct: 1.1 % (ref 0.4–3.1)

## 2024-10-06 LAB — IRON AND IRON BINDING CAPACITY (CC-WL,HP ONLY)
Iron: 95 ug/dL (ref 28–170)
Saturation Ratios: 33 % — ABNORMAL HIGH (ref 10.4–31.8)
TIBC: 288 ug/dL (ref 250–450)
UIBC: 193 ug/dL

## 2024-10-06 LAB — FERRITIN: Ferritin: 1563 ng/mL — ABNORMAL HIGH (ref 11–307)

## 2024-10-06 NOTE — Progress Notes (Signed)
 Hematology and Oncology Follow Up Visit  Kristen Murray [id number] Aug 15, 1946 78 y.o. 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 She is doing fairly well. She has been having pain in the right buttock with sciatica. PCP hs her on a steroid taper which has helped some.  Steroid has caused some night sweats.  No fever, chills, n/v, cough, rash, dizziness, SOB, chest pain, palpitations, abdominal pain or changes in bowel or bladder habits.  No blood loss. No bruising or petechiae.  No numbness or tingling in her extremities.  No falls or syncope.  Appetite and hydration are good. Weight is stable at 169 lbs.   ECOG Performance Status: 1 - Symptomatic but completely ambulatory  Medications:  Allergies as of 10/06/2024       Reactions   Codeine Hives, Other (See Comments)   Because of a history of documented adverse serious drug reaction, Medi Alert bracelet  is recommended   Macrodantin  [nitrofurantoin  Macrocrystal] Other (See Comments)   Numbness, aching all over   Other Shortness Of Breath, Rash, Other (See Comments)   Pine nuts & walnuts throat congestion. No documented angioedema.   Propoxyphene Hives, Swelling, Rash, Other (See Comments)   Propoxyphene Hcl Hives, Swelling, Rash   Augmentin  [amoxicillin -pot Clavulanate] Itching   Dulaglutide Other (See Comments)   Other reaction(s): weak, ha, no appetite   Latex Rash, Other (See Comments)   Metformin  Hcl Other (See Comments)   Other reaction(s): leg discomfort   Neosporin [bacitracin-polymyxin B] Other (See Comments)   Blisters   Zoster Vac Recomb Adjuvanted Rash, Other (See Comments)   Other reaction(s): confusion   Avocado  Rash   Banana Rash   Pine nuts   Ciprofloxacin  Nausea Only, Other (See Comments)   Nausea (Note: Patient takes this when on mission trips, however)   Tizanidine Other (See Comments)   Reaction not recalled   Tramadol  Nausea Only        Medication List        Accurate as of October 06, 2024 10:32 AM. If you have any questions, ask your nurse or doctor.          albuterol  108 (90 Base) MCG/ACT inhaler Commonly known as: VENTOLIN  HFA Inhale 2 puffs into the lungs every 4 (four) hours as needed.   apixaban  5 MG Tabs tablet Commonly known as: Eliquis  Take 1 tablet (5 mg total) by mouth 2 (two) times daily.   ASCORBIC ACID PO Take 1 tablet by mouth daily. Dosage unknown   azelastine 0.1 % nasal spray Commonly known as: ASTELIN Place into both nostrils 2 (two) times daily. Use in each nostril as directed   b complex vitamins capsule Take 1 capsule by mouth daily.   cetirizine  10 MG tablet Commonly known as: ZYRTEC  Take 1 tablet once a day as needed for runny nose/drainage down throat   Cholecalciferol 25 MCG (1000 UT) capsule Take 1,000 Units by mouth daily.   cycloSPORINE 0.05 % ophthalmic emulsion Commonly known as: RESTASIS Place 1 drop into both eyes every Monday, Wednesday, and Friday.   dextromethorphan -guaiFENesin  30-600 MG 12hr tablet Commonly known as: MUCINEX  DM Take 1 tablet  by mouth 2 (two) times daily.   DropSafe Alcohol Prep 70 % Pads Apply topically.   EPINEPHrine  0.3 mg/0.3 mL Soaj injection Commonly known as: EpiPen  2-Pak Inject 0.3 mg into the muscle as needed for anaphylaxis.   estradiol 0.1 MG/GM vaginal cream Commonly known as: ESTRACE Place 1 Applicatorful vaginally once a week. 1-2 weekly   fluorometholone 0.1 % ophthalmic suspension Commonly known as: FML Place 1 drop into both eyes every Monday, Wednesday, and Friday.   fluticasone  50 MCG/ACT nasal spray Commonly known as: FLONASE  Place 1 spray into both nostrils 2 (two) times  daily.   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   glipiZIDE  10 MG 24 hr tablet Commonly known as: GLUCOTROL  XL Take 20 mg by mouth daily.   glucose blood test strip Inject 1 strip into the skin daily.   glucose blood test strip Commonly known as: FREESTYLE LITE CHECK BLOOD SUGAR ONCE DAILY AS DIRECTED.  DX:790.29   ipratropium 0.06 % nasal spray Commonly known as: ATROVENT  Place 2 sprays into both nostrils every 6 (six) hours as needed.   Lantus  SoloStar 100 UNIT/ML Solostar Pen Generic drug: insulin  glargine Inject 10 Units into the skin daily.   levothyroxine  25 MCG tablet Commonly known as: SYNTHROID  Take 25 mcg by mouth daily.   lidocaine  5 % ointment Commonly known as: XYLOCAINE  Apply 1 application topically 3 (three) times daily as needed.   metoprolol  tartrate 50 MG tablet Commonly known as: LOPRESSOR  Take 1.5 tablets (75 mg total) by mouth 2 (two) times daily.   montelukast  10 MG tablet Commonly known as: SINGULAIR  Take 1 tablet (10 mg total) by mouth at bedtime.   multivitamin tablet Take 1 tablet by mouth daily. Shaklee brand   nitroGLYCERIN  0.4 MG SL tablet Commonly known as: NITROSTAT  Place 1 tablet (0.4 mg total) under the tongue every 5 (five) minutes as needed for chest pain.   omeprazole 20 MG capsule Commonly known as: PRILOSEC Take 20 mg by mouth daily.   rosuvastatin  5 MG tablet Commonly known as: CRESTOR  Take 1 tablet (5 mg total) by mouth daily.   triamcinolone  0.025 % ointment Commonly known as: KENALOG  Apply 1 Application topically 2 (two) times daily. Use twice a day for 7 days as needed.   trolamine salicylate 10 % cream Commonly known as: ASPERCREME Apply 1 application topically as needed for muscle pain.   valACYclovir  500 MG tablet Commonly known as: VALTREX  Take 1 tablet (500 mg total) by  mouth 2 (two) times daily. Take twice a day for thee days as needed for an outbreak.   verapamil  180 MG CR tablet Commonly known as: CALAN -SR Take 180 mg by mouth at bedtime.        Allergies:  Allergies  Allergen Reactions   Codeine Hives and Other (See Comments)    Because of a history of documented adverse serious drug reaction, Medi Alert bracelet  is recommended   Macrodantin  [Nitrofurantoin  Macrocrystal] Other (See Comments)    Numbness, aching all over   Other Shortness Of Breath, Rash and Other (See Comments)    Pine nuts & walnuts throat congestion. No documented angioedema.    Propoxyphene Hives, Swelling, Rash and Other (See Comments)   Propoxyphene Hcl Hives, Swelling and Rash   Augmentin  [Amoxicillin -Pot Clavulanate] Itching   Dulaglutide Other (See Comments)    Other reaction(s): weak, ha, no  appetite   Latex Rash and Other (See Comments)   Metformin  Hcl Other (See Comments)    Other reaction(s): leg discomfort   Neosporin [Bacitracin-Polymyxin B] Other (See Comments)    Blisters   Zoster Vac Recomb Adjuvanted Rash and Other (See Comments)    Other reaction(s): confusion   Avocado Rash   Banana Rash    Pine nuts   Ciprofloxacin  Nausea Only and Other (See Comments)    Nausea (Note: Patient takes this when on mission trips, however)   Tizanidine Other (See Comments)    Reaction not recalled   Tramadol  Nausea Only    Past Medical History, Surgical history, Social history, and Family History were reviewed and updated.  Review of Systems: All other 10 point review of systems is negative.   Physical Exam:  vitals were not taken for this visit.   Wt Readings from Last 3 Encounters:  09/05/24 169 lb (76.7 kg)  08/04/24 169 lb 8 oz (76.9 kg)  07/18/24 167 lb (75.8 kg)    Ocular: Sclerae unicteric, pupils equal, round and reactive to light Ear-nose-throat: Oropharynx clear, dentition fair Lymphatic: No cervical or supraclavicular adenopathy Lungs no  rales or rhonchi, good excursion bilaterally Heart regular rate and rhythm, no murmur appreciated Abd soft, nontender, positive bowel sounds MSK no focal spinal tenderness, no joint edema Neuro: non-focal, well-oriented, appropriate affect Breasts: Deferred  Lab Results  Component Value Date   WBC 2.2 (L) 09/05/2024   HGB 10.7 (L) 09/05/2024   HCT 33.7 (L) 09/05/2024   MCV 86.2 09/05/2024   PLT 156 09/05/2024   Lab Results  Component Value Date   FERRITIN 1,427 (H) 09/05/2024   IRON  32 09/05/2024   TIBC 235 (L) 09/05/2024   UIBC 203 09/05/2024   IRONPCTSAT 14 09/05/2024   Lab Results  Component Value Date   RETICCTPCT 0.6 09/05/2024   RBC 4.01 09/05/2024   RBC 3.91 09/05/2024   No results found for: KPAFRELGTCHN, LAMBDASER, KAPLAMBRATIO No results found for: IGGSERUM, IGA, IGMSERUM No results found for: STEPHANY CARLOTA BENSON MARKEL EARLA JOANNIE DOC VICK, SPEI   Chemistry      Component Value Date/Time   NA 136 09/05/2024 1250   NA 138 03/10/2020 1138   K 4.5 09/05/2024 1250   CL 101 09/05/2024 1250   CO2 25 09/05/2024 1250   BUN 13 09/05/2024 1250   BUN 10 03/10/2020 1138   CREATININE 0.63 09/05/2024 1250      Component Value Date/Time   CALCIUM  9.8 09/05/2024 1250   ALKPHOS 103 09/05/2024 1250   AST 18 09/05/2024 1250   ALT 9 09/05/2024 1250   BILITOT 0.3 09/05/2024 1250       Impression and Plan: Ms. Berko is a very pleasant 78 yo African American female with low-grade MDS, low IPSS-R score.  Iron  studies are pending. We will replace if needed.  No Aranesp  or Zarxio  needed this visit.  We will see her again in 1 month.   Lauraine Pepper, NP 12/8/202510:32 AM

## 2024-10-06 NOTE — Telephone Encounter (Signed)
 ANC reported as .3 per Lab downstairs.  Dr Timmy notified. No orders received.

## 2024-11-11 ENCOUNTER — Telehealth: Payer: Self-pay

## 2024-11-11 ENCOUNTER — Inpatient Hospital Stay: Attending: Hematology & Oncology

## 2024-11-11 ENCOUNTER — Encounter: Payer: Self-pay | Admitting: Family

## 2024-11-11 ENCOUNTER — Other Ambulatory Visit: Payer: Self-pay

## 2024-11-11 ENCOUNTER — Inpatient Hospital Stay

## 2024-11-11 ENCOUNTER — Inpatient Hospital Stay: Admitting: Family

## 2024-11-11 VITALS — BP 135/53 | HR 67 | Temp 98.5°F | Resp 17 | Ht 65.0 in | Wt 170.0 lb

## 2024-11-11 DIAGNOSIS — D72819 Decreased white blood cell count, unspecified: Secondary | ICD-10-CM

## 2024-11-11 DIAGNOSIS — D500 Iron deficiency anemia secondary to blood loss (chronic): Secondary | ICD-10-CM | POA: Diagnosis not present

## 2024-11-11 DIAGNOSIS — D46Z Other myelodysplastic syndromes: Secondary | ICD-10-CM

## 2024-11-11 DIAGNOSIS — Z79899 Other long term (current) drug therapy: Secondary | ICD-10-CM | POA: Insufficient documentation

## 2024-11-11 DIAGNOSIS — Z7901 Long term (current) use of anticoagulants: Secondary | ICD-10-CM | POA: Diagnosis not present

## 2024-11-11 DIAGNOSIS — K909 Intestinal malabsorption, unspecified: Secondary | ICD-10-CM | POA: Diagnosis not present

## 2024-11-11 DIAGNOSIS — D4620 Refractory anemia with excess of blasts, unspecified: Secondary | ICD-10-CM | POA: Diagnosis present

## 2024-11-11 DIAGNOSIS — D508 Other iron deficiency anemias: Secondary | ICD-10-CM | POA: Insufficient documentation

## 2024-11-11 LAB — CBC WITH DIFFERENTIAL (CANCER CENTER ONLY)
Abs Immature Granulocytes: 0.03 K/uL (ref 0.00–0.07)
Basophils Absolute: 0 K/uL (ref 0.0–0.1)
Basophils Relative: 0 %
Eosinophils Absolute: 0 K/uL (ref 0.0–0.5)
Eosinophils Relative: 0 %
HCT: 36.3 % (ref 36.0–46.0)
Hemoglobin: 11.8 g/dL — ABNORMAL LOW (ref 12.0–15.0)
Immature Granulocytes: 1 %
Lymphocytes Relative: 67 %
Lymphs Abs: 1.7 K/uL (ref 0.7–4.0)
MCH: 27.9 pg (ref 26.0–34.0)
MCHC: 32.5 g/dL (ref 30.0–36.0)
MCV: 85.8 fL (ref 80.0–100.0)
Monocytes Absolute: 0.4 K/uL (ref 0.1–1.0)
Monocytes Relative: 17 %
Neutro Abs: 0.4 K/uL — CL (ref 1.7–7.7)
Neutrophils Relative %: 15 %
Platelet Count: 146 K/uL — ABNORMAL LOW (ref 150–400)
RBC: 4.23 MIL/uL (ref 3.87–5.11)
RDW: 16.7 % — ABNORMAL HIGH (ref 11.5–15.5)
WBC Count: 2.7 K/uL — ABNORMAL LOW (ref 4.0–10.5)
nRBC: 0 % (ref 0.0–0.2)

## 2024-11-11 LAB — CMP (CANCER CENTER ONLY)
ALT: 19 U/L (ref 0–44)
AST: 21 U/L (ref 15–41)
Albumin: 4 g/dL (ref 3.5–5.0)
Alkaline Phosphatase: 101 U/L (ref 38–126)
Anion gap: 11 (ref 5–15)
BUN: 15 mg/dL (ref 8–23)
CO2: 25 mmol/L (ref 22–32)
Calcium: 9.6 mg/dL (ref 8.9–10.3)
Chloride: 100 mmol/L (ref 98–111)
Creatinine: 0.67 mg/dL (ref 0.44–1.00)
GFR, Estimated: >60 mL/min
Glucose, Bld: 314 mg/dL — ABNORMAL HIGH (ref 70–99)
Potassium: 4.2 mmol/L (ref 3.5–5.1)
Sodium: 137 mmol/L (ref 135–145)
Total Bilirubin: 0.3 mg/dL (ref 0.0–1.2)
Total Protein: 7.6 g/dL (ref 6.5–8.1)

## 2024-11-11 LAB — RETICULOCYTES
Immature Retic Fract: 4.3 % (ref 2.3–15.9)
RBC.: 4.22 MIL/uL (ref 3.87–5.11)
Retic Count, Absolute: 17.9 K/uL — ABNORMAL LOW (ref 19.0–186.0)
Retic Ct Pct: 0.4 % (ref 0.4–3.1)

## 2024-11-11 LAB — IRON AND IRON BINDING CAPACITY (CC-WL,HP ONLY)
Iron: 83 ug/dL (ref 28–170)
Saturation Ratios: 33 % — ABNORMAL HIGH (ref 10.4–31.8)
TIBC: 252 ug/dL (ref 250–450)
UIBC: 169 ug/dL

## 2024-11-11 LAB — FERRITIN: Ferritin: 1387 ng/mL — ABNORMAL HIGH (ref 11–307)

## 2024-11-11 NOTE — Progress Notes (Signed)
 " Hematology and Oncology Follow Up Visit  Kristen Murray [id number] 05-27-1946 79 y.o. 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 She is doing well over all. No recent infections. She is preparing for her trip to [place] on 3/12.  She has not gotten any immunizations yet but plans to contact the health department.  WBC count stable at 2.7 and ANC 0.4.  No fever, chills, n/v, cough, rash, dizziness, SOB, chest pain, palpitations, abdominal pain or changes in bowel or bladder habits.  No swelling, numbness or tingling in her extremities at this time. She has decided not to have the knee replacement for now.  No falls or syncope reported.  Appetite and hydration are good. Weight is stable 170 lbs.   ECOG Performance Status: 1 - Symptomatic but completely ambulatory  Medications:  Allergies as of 11/11/2024       Reactions   Codeine Hives, Other (See Comments)   Because of a history of documented adverse serious drug reaction, Medi Alert bracelet  is recommended   Macrodantin  [nitrofurantoin  Macrocrystal] Other (See Comments)   Numbness, aching all over   Other Shortness Of Breath, Rash, Other (See Comments)   Pine nuts & walnuts throat congestion. No documented angioedema.   Propoxyphene Hives, Swelling, Rash, Other (See Comments)   Propoxyphene Hcl Hives, Swelling, Rash   Augmentin  [amoxicillin -pot Clavulanate] Itching   Dulaglutide Other (See Comments)   Other reaction(s): weak, ha, no appetite   Latex Rash, Other (See Comments)   Metformin  Hcl Other (See Comments)   Other reaction(s): leg discomfort   Neosporin [bacitracin-polymyxin B] Other (See Comments)   Blisters    Zoster Vac Recomb Adjuvanted Rash, Other (See Comments)   Other reaction(s): confusion   Avocado Rash   Banana Rash   Pine nuts   Ciprofloxacin  Nausea Only, Other (See Comments)   Nausea (Note: Patient takes this when on mission trips, however)   Tizanidine Other (See Comments)   Reaction not recalled   Tramadol  Nausea Only        Medication List        Accurate as of November 11, 2024  9:58 AM. If you have any questions, ask your nurse or doctor.          albuterol  108 (90 Base) MCG/ACT inhaler Commonly known as: VENTOLIN  HFA Inhale 2 puffs into the lungs every 4 (four) hours as needed.   apixaban  5 MG Tabs tablet Commonly known as: Eliquis  Take 1 tablet (5 mg total) by mouth 2 (two) times daily.   ASCORBIC ACID PO Take 1 tablet by mouth daily. Dosage unknown   azelastine 0.1 % nasal spray Commonly known as: ASTELIN Place into both nostrils 2 (two) times daily. Use in each nostril as directed   b complex vitamins capsule Take 1 capsule by mouth daily.   cetirizine  10 MG tablet Commonly known as: ZYRTEC  Take 1 tablet once a day as needed for runny nose/drainage down throat   Cholecalciferol 25 MCG (1000 UT) capsule Take 1,000 Units by mouth daily.   cycloSPORINE 0.05 % ophthalmic emulsion  Commonly known as: RESTASIS Place 1 drop into both eyes every Monday, Wednesday, and Friday.   dextromethorphan -guaiFENesin  30-600 MG 12hr tablet Commonly known as: MUCINEX  DM Take 1 tablet by mouth 2 (two) times daily.   DropSafe Alcohol Prep 70 % Pads Apply topically.   EPINEPHrine  0.3 mg/0.3 mL Soaj injection Commonly known as: EpiPen  2-Pak Inject 0.3 mg into the muscle as needed for anaphylaxis.   estradiol 0.1 MG/GM vaginal cream Commonly known as: ESTRACE Place 1 Applicatorful vaginally once a week. 1-2 weekly   fluorometholone 0.1 % ophthalmic suspension Commonly known as: FML Place 1 drop into both eyes every Monday, Wednesday, and Friday.   fluticasone   50 MCG/ACT nasal spray Commonly known as: FLONASE  Place 1 spray into both nostrils 2 (two) times daily.   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   glipiZIDE  10 MG 24 hr tablet Commonly known as: GLUCOTROL  XL Take 20 mg by mouth daily.   glucose blood test strip Inject 1 strip into the skin daily.   glucose blood test strip Commonly known as: FREESTYLE LITE CHECK BLOOD SUGAR ONCE DAILY AS DIRECTED.  DX:790.29   ipratropium 0.06 % nasal spray Commonly known as: ATROVENT  Place 2 sprays into both nostrils every 6 (six) hours as needed.   Lantus  SoloStar 100 UNIT/ML Solostar Pen Generic drug: insulin  glargine Inject 10 Units into the skin daily.   levothyroxine  25 MCG tablet Commonly known as: SYNTHROID  Take 25 mcg by mouth daily.   lidocaine  5 % ointment Commonly known as: XYLOCAINE  Apply 1 application topically 3 (three) times daily as needed.   metoprolol  tartrate 50 MG tablet Commonly known as: LOPRESSOR  Take 1.5 tablets (75 mg total) by mouth 2 (two) times daily.   montelukast  10 MG tablet Commonly known as: SINGULAIR  Take 1 tablet (10 mg total) by mouth at bedtime.   multivitamin tablet Take 1 tablet by mouth daily. Shaklee brand   nitroGLYCERIN  0.4 MG SL tablet Commonly known as: NITROSTAT  Place 1 tablet (0.4 mg total) under the tongue every 5 (five) minutes as needed for chest pain.   omeprazole 20 MG capsule Commonly known as: PRILOSEC Take 20 mg by mouth daily.   rosuvastatin  5 MG tablet Commonly known as: CRESTOR  Take 1 tablet (5 mg total) by mouth daily.   triamcinolone  0.025 % ointment Commonly known as: KENALOG  Apply 1 Application topically 2 (two) times daily. Use twice a day for 7 days as needed.   trolamine salicylate 10 % cream Commonly known as: ASPERCREME Apply 1 application topically as needed for muscle  pain.   valACYclovir  500 MG tablet Commonly known as: VALTREX  Take 1 tablet (500 mg total) by mouth 2 (two) times daily. Take twice a day for thee days as needed for an outbreak.   verapamil  180 MG CR tablet Commonly known as: CALAN -SR Take 180 mg by mouth at bedtime.        Allergies: Allergies[1]  Past Medical History, Surgical history, Social history, and Family History were reviewed and updated.  Review of Systems: All other 10 point review of systems is negative.   Physical Exam:  vitals were not taken for this visit.   Wt Readings from Last 3 Encounters:  10/06/24 169 lb (76.7 kg)  09/05/24 169 lb (76.7 kg)  08/04/24 169 lb 8 oz (76.9 kg)    Ocular: Sclerae unicteric, pupils equal, round and  reactive to light Ear-nose-throat: Oropharynx clear, dentition fair Lymphatic: No cervical or supraclavicular adenopathy Lungs no rales or rhonchi, good excursion bilaterally Heart regular rate and rhythm, no murmur appreciated Abd soft, nontender, positive bowel sounds MSK no focal spinal tenderness, no joint edema Neuro: non-focal, well-oriented, appropriate affect Breasts: Deferred   Lab Results  Component Value Date   WBC 2.9 (L) 10/06/2024   HGB 12.3 10/06/2024   HCT 37.3 10/06/2024   MCV 85.4 10/06/2024   PLT 184 10/06/2024   Lab Results  Component Value Date   FERRITIN 1,563 (H) 10/06/2024   IRON  95 10/06/2024   TIBC 288 10/06/2024   UIBC 193 10/06/2024   IRONPCTSAT 33 (H) 10/06/2024   Lab Results  Component Value Date   RETICCTPCT 1.1 10/06/2024   RBC 4.35 10/06/2024   RBC 4.37 10/06/2024   No results found for: KPAFRELGTCHN, LAMBDASER, KAPLAMBRATIO No results found for: IGGSERUM, IGA, IGMSERUM No results found for: STEPHANY CARLOTA BENSON MARKEL EARLA JOANNIE DOC VICK, SPEI   Chemistry      Component Value Date/Time   NA 136 10/06/2024 1021   NA 138 03/10/2020 1138   K 4.1 10/06/2024 1021   CL 98  10/06/2024 1021   CO2 29 10/06/2024 1021   BUN 17 10/06/2024 1021   BUN 10 03/10/2020 1138   CREATININE 0.68 10/06/2024 1021      Component Value Date/Time   CALCIUM  9.6 10/06/2024 1021   ALKPHOS 125 10/06/2024 1021   AST 18 10/06/2024 1021   ALT 26 10/06/2024 1021   BILITOT 0.3 10/06/2024 1021       Impression and Plan: Ms. Hofland is a very pleasant 79 yo African American female with low-grade MDS, low IPSS-R score.  Iron  studies are pending. We will replace if needed.  No Aranesp  or Zarxio  needed this visit. We will reassess at exit visit and treat prior to her going to [place].  We will see her again in 1 month.   Lauraine Pepper, NP 1/13/20269:58 AM     [1]  Allergies Allergen Reactions   Codeine Hives and Other (See Comments)    Because of a history of documented adverse serious drug reaction, Medi Alert bracelet  is recommended   Macrodantin  [Nitrofurantoin  Macrocrystal] Other (See Comments)    Numbness, aching all over   Other Shortness Of Breath, Rash and Other (See Comments)    Pine nuts & walnuts throat congestion. No documented angioedema.    Propoxyphene Hives, Swelling, Rash and Other (See Comments)   Propoxyphene Hcl Hives, Swelling and Rash   Augmentin  [Amoxicillin -Pot Clavulanate] Itching   Dulaglutide Other (See Comments)    Other reaction(s): weak, ha, no appetite   Latex Rash and Other (See Comments)   Metformin  Hcl Other (See Comments)    Other reaction(s): leg discomfort   Neosporin [Bacitracin-Polymyxin B] Other (See Comments)    Blisters   Zoster Vac Recomb Adjuvanted Rash and Other (See Comments)    Other reaction(s): confusion   Avocado Rash   Banana Rash    Pine nuts   Ciprofloxacin  Nausea Only and Other (See Comments)    Nausea (Note: Patient takes this when on mission trips, however)   Tizanidine Other (See Comments)    Reaction not recalled   Tramadol  Nausea Only   "

## 2024-11-11 NOTE — Telephone Encounter (Signed)
 Call received from [facility]) reporting critical lab value (ANC 0.4). Lauraine Pepper, NP notified. No new orders received.

## 2024-11-17 NOTE — Patient Instructions (Incomplete)
" °  1. Continue avoidance - tree nuts, pine nuts, banana, avocado,  tree, grass, weed  2. Continue to Treat and prevent inflammation:   A. Flonase  nasal spray stopped by primary care physician to nasal sore. Continue mupirocin as per your primary care physician. If the nasal sore does not heal in your right nostril we can refer you to see ENT  B. montelukast  10 mg tablet once a day  3. Continue to Treat LPR:   A. Continue to consolidate all caffeine and chocolate use  B. Continue to replace throat clearing with swallowing maneuver  C. Pantoprazole  40 mg - 1 tablet 1-2 times per day  4. If needed:   A. nasal saline  B. OTC antihistamine - Cetirizine  (Zyrtec ). Try decreasing this medication to once a day as this can make you feel sleepy. Please discuss your sleepiness/headaches with your primary care physician  C. Epi-Pen  D. Can add OTC Mucinex  two times per day  E. Ipratropium 0.06% 2 sprays each nostril every 6 hours . This can help with runny nose/drainage down the throat. Let us  know if you have this medication and are using it  F. Azelastine nasal spray as per primary care physician  5. Influenza = Tamiflu. Covid = Paxlovid 6. Hold off of using all nasal sprays in your right nostril for the next 5-7 days due to the irritation in that nostril 7. Discussed that pain in right chest area sounds muscular do to it occurring with movement and breathing. If pain persists recommend scheduling an appointment with your GYN and/or primary care physician  7. Return to clinic in 4 months or earlier if problem       "

## 2024-11-18 ENCOUNTER — Encounter: Payer: Self-pay | Admitting: Family

## 2024-11-18 ENCOUNTER — Ambulatory Visit: Admitting: Family

## 2024-11-18 ENCOUNTER — Other Ambulatory Visit: Payer: Self-pay

## 2024-11-18 VITALS — BP 150/70 | HR 71 | Temp 97.2°F

## 2024-11-18 DIAGNOSIS — K219 Gastro-esophageal reflux disease without esophagitis: Secondary | ICD-10-CM | POA: Diagnosis not present

## 2024-11-18 DIAGNOSIS — J301 Allergic rhinitis due to pollen: Secondary | ICD-10-CM

## 2024-11-18 DIAGNOSIS — T7805XD Anaphylactic reaction due to tree nuts and seeds, subsequent encounter: Secondary | ICD-10-CM | POA: Diagnosis not present

## 2024-11-18 MED ORDER — EPINEPHRINE 0.3 MG/0.3ML IJ SOAJ: 0.3000 mg | INTRAMUSCULAR | 1 refills | Status: AC | PRN

## 2024-11-18 NOTE — Progress Notes (Signed)
 "  [address] [place] [address] Dept: [phone]  FOLLOW UP NOTE  Patient ID: Kristen Murray, female    DOB: 1946/05/21  Age: 79 y.o. 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  She was last seen by myself on August 13, 2024.  She denies any new diagnosis or surgery since her last office visit.  Allergic rhinitis: She reports that she saw her primary care physician and was told that she had a sore in her right nostril.  She was instructed to stop her Flonase  nasal spray and she was given an ointment (mupirocin) to use.  She reports that the area in her right nostril is getting better.  She reports some postnasal drip that is clear in color and denies fever, chills,  rhinorrhea and nasal congestion.  The rhinorrhea she was having at the last office visit did clear up.  She continues to take Zyrtec  10 mg daily.  She has not noticed a decrease in her sleepiness since decreasing Zyrtec  to once a day.  Verbally recommended discussing her sleepiness with her primary care physician.  She also uses ipratropium 0.06% nasal spray as needed and azelastine nasal spray as needed.  She reports that azelastine has a bad after taste.  She has not been treated for any sinus infections since we last saw her.  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  Laryngopharyngeal reflux disease: She continues to take pantoprazole  40 mg once a day.  She reports her reflux has been good.  She does drink 1 cup of plain coffee in the morning.  She reports for the past week or 2 she  will have pain in her right breast with movement and breathing.  She has not felt any bumps or lumps.  She also reports off-and-on soreness on both sides of her neck.     Drug Allergies:  Allergies[1]  Review of Systems: Negative except as per HPI   Physical Exam: BP (!) 150/70   Pulse 71   Temp (!) 97.2 F (36.2 C)   LMP 07/30/1990   SpO2 97%    Physical Exam Constitutional:      Appearance: Normal appearance.  HENT:     Head: Normocephalic and atraumatic.     Comments: Pharynx normal, eyes normal ears normal, nose: Irritation noted in right nostril.  Left nostril normal.    Right Ear: Tympanic membrane, ear canal and external ear normal.     Left Ear: Tympanic membrane, ear canal and external ear normal.     Mouth/Throat:     Mouth: Mucous membranes are moist.     Pharynx: Oropharynx is clear.  Eyes:     Conjunctiva/sclera: Conjunctivae normal.  Cardiovascular:     Rate and Rhythm: Regular rhythm.     Heart sounds: Normal heart sounds.  Pulmonary:     Effort: Pulmonary effort is normal.     Breath sounds: Normal breath sounds.     Comments: Lungs clear to auscultation Musculoskeletal:     Cervical back: Neck supple.  Skin:    General: Skin is warm.  Neurological:  Mental Status: She is alert and oriented to person, place, and time.  Psychiatric:        Mood and Affect: Mood normal.        Behavior: Behavior normal.        Thought Content: Thought content normal.        Judgment: Judgment normal.     Diagnostics:  None  Assessment and Plan: 1. Seasonal allergic rhinitis due to pollen   2. 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 LPRD (laryngopharyngeal reflux disease)     Meds ordered this encounter  Medications   EPINEPHrine  (EPIPEN  2-PAK) 0.3 mg/0.3 mL IJ SOAJ injection    Sig: Inject 0.3 mg into the muscle as needed for anaphylaxis.    Dispense:  2 each    Refill:  1    Patient Instructions   1. Continue  avoidance - tree nuts, pine nuts, banana, avocado,  tree, grass, weed  2. Continue to Treat and prevent inflammation:   A. Flonase  nasal spray stopped by primary care physician to nasal sore. Continue mupirocin as per your primary care physician. If the nasal sore does not heal in your right nostril we can refer you to see ENT  B. montelukast  10 mg tablet once a day  3. Continue to Treat LPR:   A. Continue to consolidate all caffeine and chocolate use  B. Continue to replace throat clearing with swallowing maneuver  C. Pantoprazole  40 mg - 1 tablet 1-2 times per day  4. If needed:   A. nasal saline  B. OTC antihistamine - Cetirizine  (Zyrtec ). Try decreasing this medication to once a day as this can make you feel sleepy. Please discuss your sleepiness/headaches with your primary care physician  C. Epi-Pen  D. Can add OTC Mucinex  two times per day  E. Ipratropium 0.06% 2 sprays each nostril every 6 hours . This can help with runny nose/drainage down the throat. Let us  know if you have this medication and are using it  F. Azelastine nasal spray as per primary care physician  5. Influenza = Tamiflu. Covid = Paxlovid 6. Hold off of using all nasal sprays in your right nostril for the next 5-7 days due to the irritation in that nostril 7. Discussed that pain in right chest area sounds muscular do to it occurring with movement and breathing. If pain persists recommend scheduling an appointment with your GYN and/or primary care physician  7. Return to clinic in 4 months or earlier if problem        Return in about 4 months (around 03/18/2025).    Thank you for the opportunity to care for this patient.  Please do not hesitate to contact me with questions.  Wanda Craze, FNP [facility]         [1]  Allergies Allergen Reactions   Codeine Hives and Other (See Comments)    Because of a history of documented adverse serious drug reaction, Medi  Alert bracelet  is recommended   Macrodantin  [Nitrofurantoin  Macrocrystal] Other (See Comments)    Numbness, aching all over   Other Shortness Of Breath, Rash and Other (See Comments)    Pine nuts & walnuts throat congestion. No documented angioedema.    Propoxyphene Hives, Swelling, Rash and Other (See Comments)   Propoxyphene Hcl Hives, Swelling and Rash   Augmentin  [Amoxicillin -Pot Clavulanate] Itching   Dulaglutide Other (See Comments)    Other reaction(s): weak, ha, no appetite  Latex Rash and Other (See Comments)   Metformin  Hcl Other (See Comments)    Other reaction(s): leg discomfort   Neosporin [Bacitracin-Polymyxin B] Other (See Comments)    Blisters   Zoster Vac Recomb Adjuvanted Rash and Other (See Comments)    Other reaction(s): confusion   Avocado Rash   Banana Rash    Pine nuts   Ciprofloxacin  Nausea Only and Other (See Comments)    Nausea (Note: Patient takes this when on mission trips, however)   Tizanidine Other (See Comments)    Reaction not recalled   Tramadol  Nausea Only   "

## 2024-12-10 ENCOUNTER — Inpatient Hospital Stay: Admitting: Family

## 2024-12-10 ENCOUNTER — Inpatient Hospital Stay: Attending: Hematology & Oncology

## 2024-12-10 ENCOUNTER — Inpatient Hospital Stay

## 2024-12-24 ENCOUNTER — Ambulatory Visit: Admitting: Podiatry

## 2025-03-24 ENCOUNTER — Ambulatory Visit: Admitting: Allergy and Immunology

## 2025-07-13 ENCOUNTER — Encounter: Admitting: Obstetrics and Gynecology
